# Patient Record
Sex: Female | Born: 1970
Health system: Southern US, Community
[De-identification: ages and names within clinical notes are randomized; demographics above are authoritative.]

## PROBLEM LIST (undated history)

## (undated) DIAGNOSIS — Z9889 Other specified postprocedural states: Secondary | ICD-10-CM

## (undated) DIAGNOSIS — M1990 Unspecified osteoarthritis, unspecified site: Secondary | ICD-10-CM

## (undated) DIAGNOSIS — M940 Chondrocostal junction syndrome [Tietze]: Secondary | ICD-10-CM

## (undated) DIAGNOSIS — T8859XA Other complications of anesthesia, initial encounter: Secondary | ICD-10-CM

## (undated) DIAGNOSIS — T4145XA Adverse effect of unspecified anesthetic, initial encounter: Secondary | ICD-10-CM

## (undated) DIAGNOSIS — Z8719 Personal history of other diseases of the digestive system: Secondary | ICD-10-CM

## (undated) DIAGNOSIS — K589 Irritable bowel syndrome without diarrhea: Secondary | ICD-10-CM

## (undated) DIAGNOSIS — B009 Herpesviral infection, unspecified: Secondary | ICD-10-CM

## (undated) DIAGNOSIS — L03311 Cellulitis of abdominal wall: Secondary | ICD-10-CM

## (undated) DIAGNOSIS — K219 Gastro-esophageal reflux disease without esophagitis: Secondary | ICD-10-CM

## (undated) DIAGNOSIS — Z8619 Personal history of other infectious and parasitic diseases: Secondary | ICD-10-CM

## (undated) DIAGNOSIS — I639 Cerebral infarction, unspecified: Secondary | ICD-10-CM

## (undated) DIAGNOSIS — J45909 Unspecified asthma, uncomplicated: Secondary | ICD-10-CM

## (undated) DIAGNOSIS — E282 Polycystic ovarian syndrome: Secondary | ICD-10-CM

## (undated) DIAGNOSIS — E119 Type 2 diabetes mellitus without complications: Secondary | ICD-10-CM

## (undated) DIAGNOSIS — Z87442 Personal history of urinary calculi: Secondary | ICD-10-CM

## (undated) DIAGNOSIS — G43909 Migraine, unspecified, not intractable, without status migrainosus: Secondary | ICD-10-CM

## (undated) DIAGNOSIS — R112 Nausea with vomiting, unspecified: Secondary | ICD-10-CM

## (undated) DIAGNOSIS — Q403 Congenital malformation of stomach, unspecified: Secondary | ICD-10-CM

## (undated) DIAGNOSIS — I10 Essential (primary) hypertension: Secondary | ICD-10-CM

## (undated) DIAGNOSIS — E538 Deficiency of other specified B group vitamins: Secondary | ICD-10-CM

## (undated) DIAGNOSIS — Z8742 Personal history of other diseases of the female genital tract: Secondary | ICD-10-CM

## (undated) HISTORY — PX: CORONARY ANGIOPLASTY: SHX604

## (undated) HISTORY — PX: CHOLECYSTECTOMY: SHX55

## (undated) HISTORY — PX: OVARY SURGERY: SHX727

## (undated) HISTORY — PX: TONSILLECTOMY: SUR1361

## (undated) HISTORY — DX: Cerebral infarction, unspecified: I63.9

## (undated) HISTORY — PX: ESOPHAGOGASTRODUODENOSCOPY: SHX1529

## (undated) HISTORY — PX: LAPAROSCOPIC GASTRIC BANDING: SHX1100

## (undated) HISTORY — PX: COLONOSCOPY: SHX174

## (undated) HISTORY — PX: BREAST EXCISIONAL BIOPSY: SUR124

## (undated) HISTORY — PX: TUBAL LIGATION: SHX77

---

## 2004-07-15 ENCOUNTER — Other Ambulatory Visit: Payer: Self-pay

## 2004-07-22 ENCOUNTER — Ambulatory Visit: Payer: Self-pay | Admitting: Obstetrics and Gynecology

## 2004-07-27 ENCOUNTER — Ambulatory Visit: Payer: Self-pay | Admitting: Internal Medicine

## 2004-08-21 HISTORY — PX: TUBAL LIGATION: SHX77

## 2004-10-28 ENCOUNTER — Ambulatory Visit: Payer: Self-pay

## 2004-12-09 ENCOUNTER — Emergency Department: Payer: Self-pay | Admitting: Emergency Medicine

## 2004-12-16 ENCOUNTER — Ambulatory Visit: Payer: Self-pay

## 2004-12-16 ENCOUNTER — Emergency Department: Payer: Self-pay | Admitting: Emergency Medicine

## 2005-02-19 ENCOUNTER — Observation Stay: Payer: Self-pay | Admitting: Obstetrics and Gynecology

## 2005-05-16 ENCOUNTER — Observation Stay: Payer: Self-pay | Admitting: Obstetrics and Gynecology

## 2005-05-22 ENCOUNTER — Observation Stay: Payer: Self-pay | Admitting: Obstetrics and Gynecology

## 2005-05-25 ENCOUNTER — Observation Stay: Payer: Self-pay | Admitting: Obstetrics and Gynecology

## 2005-05-27 ENCOUNTER — Observation Stay: Payer: Self-pay | Admitting: Obstetrics and Gynecology

## 2005-06-01 ENCOUNTER — Observation Stay: Payer: Self-pay | Admitting: Obstetrics and Gynecology

## 2005-06-03 ENCOUNTER — Observation Stay: Payer: Self-pay

## 2005-06-05 ENCOUNTER — Observation Stay: Payer: Self-pay | Admitting: Obstetrics and Gynecology

## 2005-06-06 ENCOUNTER — Ambulatory Visit: Payer: Self-pay | Admitting: Obstetrics and Gynecology

## 2005-06-08 ENCOUNTER — Observation Stay: Payer: Self-pay

## 2005-06-12 ENCOUNTER — Observation Stay: Payer: Self-pay | Admitting: Obstetrics and Gynecology

## 2005-06-15 ENCOUNTER — Observation Stay: Payer: Self-pay | Admitting: Obstetrics and Gynecology

## 2005-06-19 ENCOUNTER — Observation Stay: Payer: Self-pay | Admitting: Obstetrics and Gynecology

## 2005-06-22 ENCOUNTER — Observation Stay: Payer: Self-pay

## 2005-06-26 ENCOUNTER — Observation Stay: Payer: Self-pay | Admitting: Obstetrics and Gynecology

## 2005-06-29 ENCOUNTER — Observation Stay: Payer: Self-pay | Admitting: Obstetrics and Gynecology

## 2005-07-03 ENCOUNTER — Observation Stay: Payer: Self-pay | Admitting: Obstetrics and Gynecology

## 2005-07-06 ENCOUNTER — Observation Stay: Payer: Self-pay | Admitting: Certified Nurse Midwife

## 2005-07-10 ENCOUNTER — Observation Stay: Payer: Self-pay | Admitting: Obstetrics and Gynecology

## 2005-07-14 ENCOUNTER — Observation Stay: Payer: Self-pay | Admitting: Obstetrics and Gynecology

## 2005-07-17 ENCOUNTER — Observation Stay: Payer: Self-pay

## 2005-07-20 ENCOUNTER — Observation Stay: Payer: Self-pay

## 2005-07-24 ENCOUNTER — Inpatient Hospital Stay: Payer: Self-pay | Admitting: Obstetrics and Gynecology

## 2005-10-20 ENCOUNTER — Inpatient Hospital Stay: Payer: Self-pay | Admitting: Internal Medicine

## 2006-07-31 ENCOUNTER — Ambulatory Visit: Payer: Self-pay | Admitting: Obstetrics and Gynecology

## 2006-10-09 ENCOUNTER — Emergency Department: Payer: Self-pay | Admitting: Emergency Medicine

## 2008-02-24 ENCOUNTER — Ambulatory Visit: Payer: Self-pay | Admitting: Internal Medicine

## 2008-03-21 ENCOUNTER — Ambulatory Visit: Payer: Self-pay | Admitting: Internal Medicine

## 2008-05-18 ENCOUNTER — Ambulatory Visit: Payer: Self-pay | Admitting: Internal Medicine

## 2009-02-26 ENCOUNTER — Emergency Department: Payer: Self-pay | Admitting: Emergency Medicine

## 2009-06-03 ENCOUNTER — Ambulatory Visit: Payer: Self-pay | Admitting: Obstetrics and Gynecology

## 2010-06-29 ENCOUNTER — Ambulatory Visit: Payer: Self-pay

## 2010-08-21 HISTORY — PX: LAPAROSCOPIC GASTRIC BANDING: SHX1100

## 2011-06-29 ENCOUNTER — Ambulatory Visit: Payer: Self-pay | Admitting: Obstetrics and Gynecology

## 2011-07-02 ENCOUNTER — Inpatient Hospital Stay: Payer: Self-pay | Admitting: Internal Medicine

## 2011-07-05 ENCOUNTER — Ambulatory Visit: Payer: Self-pay | Admitting: Obstetrics and Gynecology

## 2012-04-17 ENCOUNTER — Emergency Department: Payer: Self-pay | Admitting: Emergency Medicine

## 2012-04-17 LAB — CBC
HCT: 41.4 % (ref 35.0–47.0)
HGB: 14 g/dL (ref 12.0–16.0)
MCH: 30.4 pg (ref 26.0–34.0)
MCHC: 33.9 g/dL (ref 32.0–36.0)
MCV: 90 fL (ref 80–100)
Platelet: 327 10*3/uL (ref 150–440)
RBC: 4.62 10*6/uL (ref 3.80–5.20)
RDW: 14 % (ref 11.5–14.5)
WBC: 9.4 10*3/uL (ref 3.6–11.0)

## 2012-04-17 LAB — COMPREHENSIVE METABOLIC PANEL
Albumin: 3.5 g/dL (ref 3.4–5.0)
Alkaline Phosphatase: 109 U/L (ref 50–136)
Anion Gap: 7 (ref 7–16)
BUN: 16 mg/dL (ref 7–18)
Bilirubin,Total: 0.3 mg/dL (ref 0.2–1.0)
Calcium, Total: 8.9 mg/dL (ref 8.5–10.1)
Chloride: 102 mmol/L (ref 98–107)
Co2: 29 mmol/L (ref 21–32)
Creatinine: 0.67 mg/dL (ref 0.60–1.30)
EGFR (African American): >60
EGFR (Non-African Amer.): >60
Glucose: 112 mg/dL — ABNORMAL HIGH (ref 65–99)
Osmolality: 278 (ref 275–301)
Potassium: 3.5 mmol/L (ref 3.5–5.1)
SGOT(AST): 28 U/L (ref 15–37)
SGPT (ALT): 30 U/L (ref 12–78)
Sodium: 138 mmol/L (ref 136–145)
Total Protein: 7.6 g/dL (ref 6.4–8.2)

## 2012-04-18 ENCOUNTER — Inpatient Hospital Stay: Payer: Self-pay | Admitting: Internal Medicine

## 2012-04-19 LAB — CBC WITH DIFFERENTIAL/PLATELET
Basophil #: 0.1 10*3/uL (ref 0.0–0.1)
Basophil %: 0.7 %
Eosinophil #: 0.2 10*3/uL (ref 0.0–0.7)
Eosinophil %: 2.6 %
HCT: 42.2 % (ref 35.0–47.0)
HGB: 14.5 g/dL (ref 12.0–16.0)
Lymphocyte #: 1.9 10*3/uL (ref 1.0–3.6)
Lymphocyte %: 24.3 %
MCH: 30.9 pg (ref 26.0–34.0)
MCHC: 34.4 g/dL (ref 32.0–36.0)
MCV: 90 fL (ref 80–100)
Monocyte #: 0.5 x10 3/mm (ref 0.2–0.9)
Monocyte %: 6.2 %
Neutrophil #: 5.2 10*3/uL (ref 1.4–6.5)
Neutrophil %: 66.2 %
Platelet: 306 10*3/uL (ref 150–440)
RBC: 4.7 10*6/uL (ref 3.80–5.20)
RDW: 14 % (ref 11.5–14.5)
WBC: 7.9 10*3/uL (ref 3.6–11.0)

## 2012-04-19 LAB — BASIC METABOLIC PANEL
Anion Gap: 8 (ref 7–16)
BUN: 13 mg/dL (ref 7–18)
Calcium, Total: 8.7 mg/dL (ref 8.5–10.1)
Chloride: 102 mmol/L (ref 98–107)
Co2: 26 mmol/L (ref 21–32)
Creatinine: 0.7 mg/dL (ref 0.60–1.30)
EGFR (African American): >60
EGFR (Non-African Amer.): >60
Glucose: 105 mg/dL — ABNORMAL HIGH (ref 65–99)
Osmolality: 272 (ref 275–301)
Potassium: 3.8 mmol/L (ref 3.5–5.1)
Sodium: 136 mmol/L (ref 136–145)

## 2012-04-21 LAB — WOUND CULTURE

## 2012-04-23 LAB — WOUND AEROBIC CULTURE

## 2012-04-23 LAB — CULTURE, BLOOD (SINGLE)

## 2012-06-13 ENCOUNTER — Ambulatory Visit: Payer: Self-pay | Admitting: Internal Medicine

## 2012-06-21 ENCOUNTER — Ambulatory Visit: Payer: Self-pay | Admitting: Internal Medicine

## 2012-08-21 HISTORY — PX: ESOPHAGOGASTRODUODENOSCOPY: SHX1529

## 2012-08-21 HISTORY — PX: COLONOSCOPY: SHX174

## 2012-09-28 ENCOUNTER — Inpatient Hospital Stay: Payer: Self-pay | Admitting: Internal Medicine

## 2012-09-28 LAB — CBC WITH DIFFERENTIAL/PLATELET
Basophil #: 0.1 10*3/uL (ref 0.0–0.1)
Basophil %: 0.6 %
Eosinophil #: 0.2 10*3/uL (ref 0.0–0.7)
Eosinophil %: 2.3 %
HCT: 43.1 % (ref 35.0–47.0)
HGB: 14.1 g/dL (ref 12.0–16.0)
Lymphocyte #: 2.5 10*3/uL (ref 1.0–3.6)
Lymphocyte %: 27.6 %
MCH: 30.2 pg (ref 26.0–34.0)
MCHC: 32.6 g/dL (ref 32.0–36.0)
MCV: 93 fL (ref 80–100)
Monocyte #: 0.7 x10 3/mm (ref 0.2–0.9)
Monocyte %: 7.9 %
Neutrophil #: 5.5 10*3/uL (ref 1.4–6.5)
Neutrophil %: 61.6 %
Platelet: 359 10*3/uL (ref 150–440)
RBC: 4.66 10*6/uL (ref 3.80–5.20)
RDW: 13.6 % (ref 11.5–14.5)
WBC: 8.9 10*3/uL (ref 3.6–11.0)

## 2012-09-28 LAB — BASIC METABOLIC PANEL
Anion Gap: 8 (ref 7–16)
BUN: 16 mg/dL (ref 7–18)
Calcium, Total: 9.1 mg/dL (ref 8.5–10.1)
Chloride: 106 mmol/L (ref 98–107)
Co2: 28 mmol/L (ref 21–32)
Creatinine: 0.9 mg/dL (ref 0.60–1.30)
EGFR (African American): >60
EGFR (Non-African Amer.): >60
Glucose: 89 mg/dL (ref 65–99)
Osmolality: 284 (ref 275–301)
Potassium: 3.8 mmol/L (ref 3.5–5.1)
Sodium: 142 mmol/L (ref 136–145)

## 2012-09-29 LAB — CBC WITH DIFFERENTIAL/PLATELET
Basophil #: 0.1 10*3/uL (ref 0.0–0.1)
Basophil %: 1 %
Eosinophil #: 0.2 10*3/uL (ref 0.0–0.7)
Eosinophil %: 3.3 %
HCT: 38.7 % (ref 35.0–47.0)
HGB: 12.9 g/dL (ref 12.0–16.0)
Lymphocyte #: 2 10*3/uL (ref 1.0–3.6)
Lymphocyte %: 30.7 %
MCH: 30.7 pg (ref 26.0–34.0)
MCHC: 33.4 g/dL (ref 32.0–36.0)
MCV: 92 fL (ref 80–100)
Monocyte #: 0.5 x10 3/mm (ref 0.2–0.9)
Monocyte %: 7.1 %
Neutrophil #: 3.8 10*3/uL (ref 1.4–6.5)
Neutrophil %: 57.9 %
Platelet: 284 10*3/uL (ref 150–440)
RBC: 4.21 10*6/uL (ref 3.80–5.20)
RDW: 13.1 % (ref 11.5–14.5)
WBC: 6.6 10*3/uL (ref 3.6–11.0)

## 2012-09-29 LAB — BASIC METABOLIC PANEL
Anion Gap: 6 — ABNORMAL LOW (ref 7–16)
BUN: 11 mg/dL (ref 7–18)
Calcium, Total: 8.4 mg/dL — ABNORMAL LOW (ref 8.5–10.1)
Chloride: 109 mmol/L — ABNORMAL HIGH (ref 98–107)
Co2: 25 mmol/L (ref 21–32)
Creatinine: 0.71 mg/dL (ref 0.60–1.30)
EGFR (African American): >60
EGFR (Non-African Amer.): >60
Glucose: 105 mg/dL — ABNORMAL HIGH (ref 65–99)
Osmolality: 279 (ref 275–301)
Potassium: 3.9 mmol/L (ref 3.5–5.1)
Sodium: 140 mmol/L (ref 136–145)

## 2012-09-30 LAB — VANCOMYCIN, TROUGH: Vancomycin, Trough: 4 ug/mL — ABNORMAL LOW (ref 10–20)

## 2012-10-04 LAB — CULTURE, BLOOD (SINGLE)

## 2013-01-28 DIAGNOSIS — Z9884 Bariatric surgery status: Secondary | ICD-10-CM | POA: Insufficient documentation

## 2013-02-18 ENCOUNTER — Ambulatory Visit: Payer: Self-pay | Admitting: Internal Medicine

## 2013-03-12 ENCOUNTER — Ambulatory Visit: Payer: Self-pay | Admitting: Internal Medicine

## 2013-04-28 ENCOUNTER — Ambulatory Visit: Payer: Self-pay | Admitting: Physician Assistant

## 2013-05-05 ENCOUNTER — Ambulatory Visit: Payer: Self-pay | Admitting: Gastroenterology

## 2013-05-06 LAB — PATHOLOGY REPORT

## 2013-07-22 ENCOUNTER — Ambulatory Visit: Payer: Self-pay | Admitting: Obstetrics and Gynecology

## 2013-07-23 ENCOUNTER — Ambulatory Visit: Payer: Self-pay | Admitting: Obstetrics and Gynecology

## 2013-08-21 DIAGNOSIS — Z8619 Personal history of other infectious and parasitic diseases: Secondary | ICD-10-CM

## 2013-08-21 HISTORY — DX: Personal history of other infectious and parasitic diseases: Z86.19

## 2013-11-22 ENCOUNTER — Ambulatory Visit: Payer: Self-pay | Admitting: Internal Medicine

## 2013-12-23 DIAGNOSIS — E282 Polycystic ovarian syndrome: Secondary | ICD-10-CM | POA: Insufficient documentation

## 2013-12-23 DIAGNOSIS — I10 Essential (primary) hypertension: Secondary | ICD-10-CM | POA: Insufficient documentation

## 2014-03-17 ENCOUNTER — Ambulatory Visit: Payer: Self-pay | Admitting: Internal Medicine

## 2014-03-29 ENCOUNTER — Ambulatory Visit: Payer: Self-pay | Admitting: Physician Assistant

## 2014-06-19 DIAGNOSIS — Z8619 Personal history of other infectious and parasitic diseases: Secondary | ICD-10-CM | POA: Insufficient documentation

## 2014-09-16 ENCOUNTER — Ambulatory Visit: Payer: Self-pay | Admitting: Obstetrics and Gynecology

## 2014-09-23 ENCOUNTER — Other Ambulatory Visit (HOSPITAL_COMMUNITY): Payer: Self-pay | Admitting: Orthopedic Surgery

## 2014-09-23 DIAGNOSIS — M79641 Pain in right hand: Secondary | ICD-10-CM

## 2014-10-02 ENCOUNTER — Ambulatory Visit (HOSPITAL_COMMUNITY): Payer: Self-pay

## 2014-10-12 ENCOUNTER — Inpatient Hospital Stay (HOSPITAL_COMMUNITY)
Admission: RE | Admit: 2014-10-12 | Discharge: 2014-10-12 | Disposition: A | Discharge status: Home / Self Care | Payer: 59 | Source: Ambulatory Visit

## 2014-10-12 ENCOUNTER — Ambulatory Visit (HOSPITAL_COMMUNITY)
Admission: RE | Admit: 2014-10-12 | Discharge: 2014-10-12 | Disposition: A | Discharge status: Home / Self Care | Payer: 59 | Source: Ambulatory Visit | Attending: Orthopedic Surgery | Admitting: Orthopedic Surgery

## 2014-10-12 DIAGNOSIS — M79641 Pain in right hand: Secondary | ICD-10-CM

## 2014-10-13 ENCOUNTER — Other Ambulatory Visit (HOSPITAL_COMMUNITY): Payer: Self-pay | Admitting: Orthopedic Surgery

## 2014-10-13 DIAGNOSIS — M79641 Pain in right hand: Secondary | ICD-10-CM

## 2014-12-08 NOTE — Consult Note (Signed)
PATIENT NAME:  Kathryn Coffey, Kathryn Coffey MR#:  [id number] DATE OF BIRTH:  [birth year]  DATE OF CONSULTATION:  04/19/2012  REFERRING PHYSICIAN:   CONSULTING PHYSICIAN:  Loreli Dollar, MD  HISTORY OF PRESENT ILLNESS: This 44 year old female came in emergently to the hospital on August 29. She had a chief complaint of an area of swelling and redness of the right lower abdomen. She had a wide area of cellulitis. She has been treated with intravenous antibiotics and erythema has markedly receded but has localized abscess in the skin of the right lower quadrant.   She had some drainage from the wound in the right lower quadrant, however, culture was with no growth. Blood culture was negative.   PAST MEDICAL HISTORY:  1. Hypertension. 2. Polycystic ovary disease. 3. Obesity.   MEDICATIONS:  1. Wellbutrin. 2. Victoza. 3. Had taken Septra prior to admission. 4. Micardis with hydrochlorothiazide and a separate hydrochlorothiazide tablet has. 5. Been treated with ceftriaxone and Levaquin.   PHYSICAL EXAMINATION:  GENERAL: She is awake, alert and oriented, resting in the hospital bed.   VITAL SIGNS: Temperature 98, pulse 87, respirations 20, blood pressure 122/84, pulse oximetry 96%.   SKIN: Does have a large lower abdominal pannus. I see an ink mark which had been recorded as the spread of cellulitis which appeared to be more than 20 cm across but now appears the cellulitis has markedly receded just to a small area within the lower abdominal pannus. There is a small area of slight grayish, reddish discoloration and a small area of fluctuance.   LABORATORY, DIAGNOSTIC AND RADIOLOGICAL DATA: Her creatinine is normal. Glucose slightly elevated at 105. White blood count, hemoglobin normal.   IMPRESSION: Subcutaneous abscess of abdominal wall with surrounding cellulitis.   RECOMMENDATIONS: I recommended incision and drainage and after brief discussion treated the area with Betadine, draped with four sterile  towels and with sterile technique infiltrated skin with 1% Xylocaine with epinephrine. I made a lancing incision over the fluctuant area and there was some bloody pus which erupted which appeared to amount to about 2 mL and there was no significant necrotic tissue within the wound. The incision was approximately 14 mm long. Dressings were applied with paper tape. Swab sent for culture.   Wound care instructions given and she will follow up with Dr. Emily Filbert next week.  ____________________________ Lenna Sciara. Rochel Brome, MD jws:cms D: 04/19/2012 12:51:32 ET T: 04/19/2012 13:13:45 ET JOB#: [id number]  cc: Loreli Dollar, MD, <Dictator> Rusty Aus, MD Loreli Dollar MD ELECTRONICALLY SIGNED 04/22/2012 10:32

## 2014-12-08 NOTE — Discharge Summary (Signed)
PATIENT NAME:  Kathryn Coffey, Kathryn Coffey MR#:  [id number] DATE OF BIRTH:  04-08-1971  DATE OF ADMISSION:  04/18/2012 DATE OF DISCHARGE:  04/19/2012  DISCHARGE DIAGNOSES:  1. Abdominal wall abscess with associated cellulitis, probable staph aureus.  2. Hypertension.  3. Polycystic ovarian disease.  4. Obesity.   DISCHARGE MEDICATIONS:  1. Clindamycin 300 mg t.i.d.  2. Levaquin 500 mg daily. 3. Florastor probiotic, 1 b.i.d.  4. Micardis HCT 80/12.5 mg daily.  5. Hydrochlorothiazide 25 mg daily.  6. Wellbutrin XL 150 mg daily.  7. Victoza 0.6 mg daily.  8. Multivitamin daily.   REASON FOR ADMISSION: This is a 44 year old female who presented with abdominal wall abscess and progressive cellulitis in the face of oral antibiotics. Please see History and Physical for history of present illness, past medical history, and physical exam.   HOSPITAL COURSE: The patient was admitted, placed on vancomycin and Rocephin. Her cellulitis improved dramatically. A surgical consult was entered for plan for incision and drainage of that area. She will ultimately probably need staph skin decontamination per protocol as an outpatient and will follow up with Dr. Sabra Heck on Tuesday. Her cellulitis has resolved.   ____________________________ Rusty Aus, MD mfm:bjt D: 04/19/2012 07:54:28 ET T: 04/19/2012 08:56:19 ET JOB#: [id number]  MARK F MILLER MD ELECTRONICALLY SIGNED 04/22/2012 9:12

## 2014-12-08 NOTE — H&P (Signed)
PATIENT NAME:  Kathryn Coffey, NAFF MR#:  [id number] DATE OF BIRTH:  Sep 16, 1970  DATE OF ADMISSION:  04/18/2012  PRIMARY CARE PHYSICIAN: Dr. Emily Filbert REFERRING PHYSICIAN: Dr. Lovena Le  CHIEF COMPLAINT: Abdominal cellulitis.   HISTORY OF PRESENT ILLNESS: This is a 44 year old female with significant past medical history of hypertension, obesity, polycystic ovarian disease who presents with complaint of worsening abdominal wall cellulitis. 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 where she received a total for three days without improvement of her abdominal wall cellulitis where she followed again today with hospital health office where she was noticed to have worsening cellulitis despite three days of p.o. Septra where she received one dose of IV clindamycin and was sent to ED for further evaluation and hospitalist was requested to admit the patient for IV antibiotic administration as she failed outpatient therapy. Patient denies any fever, chills, dysuria, polyuria, chest pain, shortness of breath or discharge.   PAST MEDICAL HISTORY:  1. History of systemic hypertension.  2. History of polycystic ovary.  3. Obesity.   FAMILY HISTORY: Both parents have diabetes. Father had heart attack in his 31s.   SOCIAL HISTORY: Patient is married, has two children. 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 No history of alcohol or tobacco abuse.   ALLERGIES: Codeine, IV dye, Vicodin, Trimox.   HOME MEDICATIONS:  1. Wellbutrin XL 150 mg oral daily.  2. Victoza sub-Q injection 0.6 mg daily.  3. Septra double strength 800 mg/160 oral 2 times a day for last three days.  4. Multivitamin 1 tablet daily.  5. Micardis/hydrochlorothiazide 80/12 .5 mg oral 1 tablet daily.  6. Hydrochlorothiazide 25 mg oral daily.   REVIEW OF SYSTEMS: CONSTITUTIONAL: Patient denies any fever,  fatigue, weakness. EYES: Denies blurry vision, double vision or pain. ENT: Denies tinnitus, ear pain, hearing loss. RESPIRATORY: No cough, wheezing, hemoptysis. CARDIOVASCULAR: No chest pain, arrhythmia, palpitation. GASTROINTESTINAL: No hematemesis, melena, rectal bleed, constipation. GENITOURINARY: No dysuria, hematuria, renal colic. ENDO: No polyuria, polydipsia, heat or cold intolerance. HEMATOLOGY: No anemia, bleeding diathesis. NEUROLOGICAL: No numbness, weakness, dysarthria, epilepsy, tremors. PSYCHIATRIC: No alcohol or substance abuse.   PHYSICAL EXAMINATION:  VITAL SIGNS: Temperature 98.3, pulse 87, respiratory rate 20, blood pressure 115/57, saturating 95% on room air.   GENERAL: Well-nourished female looks comfortable in bed in no apparent distress.   HEENT: Head atraumatic, normocephalic. Pupils equal, reactive to light. Pink conjunctivae. Anicteric sclerae. Moist oral mucosa.   NECK: Supple. No thyromegaly. No JVD.   CHEST: Good air entry bilaterally. No wheezing, rales, rhonchi.   CARDIOVASCULAR: S1, S2 heard. No rubs, murmur, gallops.   ABDOMEN: Soft, nontender, nondistended. Bowel sounds present. Patient has an area of lower abdominal pain with erythema, warmth, mildly tender to palpation with a small area with minimal sanguineous secretions and skin ulceration.   PSYCHIATRIC: Appropriate affect. Awake, alert x3. Intact judgment and insight.   NEUROLOGIC: Cranial nerves grossly intact. Motor 5/5. Sensation intact and symmetrical.   EXTREMITIES: No edema. No clubbing. No cyanosis.   LABORATORY, DIAGNOSTIC AND RADIOLOGICAL DATA: Glucose 112, BUN 16, creatinine 0.67, sodium 138, potassium 3.5, chloride 102, CO2 29, white blood cells 9.4, hemoglobin 14, hematocrit 41.4, platelets 327.   ASSESSMENT AND PLAN:  1. Abdominal wall cellulitis. Patient is afebrile, has no leukocytosis but she failed outpatient antibiotic treatment so she will be admitted for IV antibiotic treatment.  Will  be started on IV vancomycin and will send blood cultures.  2. Hypertension. Appears to be well controlled. Will continue with home medication.  3. polycystic ovarian disease: cont victoza 4. Deep vein thrombosis prophylaxis sub-Q heparin. GI prophylaxis Protonix.  5. CODE STATUS: FULL CODE.   TOTAL TIME SPENT ON ADMISSION AND PATIENT CARE: 40 minutes.   ____________________________ Albertine Patricia, MD dse:cms D: 04/18/2012 01:08:45 ET T: 04/18/2012 07:49:14 ET JOB#: [id number]  cc: Albertine Patricia, MD, <Dictator> Rusty Aus, MD Alleah Dearman Graciela Husbands MD ELECTRONICALLY SIGNED 04/19/2012 0:02

## 2014-12-11 NOTE — H&P (Signed)
PATIENT NAME:  Kathryn Coffey, Kathryn Coffey MR#:  [id number] DATE OF BIRTH:  1970-12-29  DATE OF ADMISSION:  09/28/2012  PRIMARY CARE PHYSICIAN: Emily Filbert, MD.  REFERRING PHYSICIAN: Charlesetta Ivory, MD.   CHIEF COMPLAINT: Abdominal phlebitis.   HISTORY OF PRESENT ILLNESS: This is a 44 year old female with significant past medical history of hypertension, obesity, polycystic ovarian disease, with recent admission and discharged for abdominal wall cellulitis with possible abscess where she required admission for IV antibiotic, which resolved after IV Rocephin and IV vancomycin with suspicion for MRSA. The patient reports she started to notice some lower abdominal pain, this time on the left side, not on the lower side, where it started initially as a small dot, where she went and saw her primary care physician, where he give her Septra double strength where she do it for 24 hours, but she reports it progressed significantly over 1 day which prompted her to come to the ER, and reports she does not want it to progress and to worsen like the previous one, which prompted her to come to the ER. The patient had a CT of the abdominal wall, which came questionable for a tiny abscess formation in  the subcutaneous soft tissue in the left lower quadrant, 1 x 0.3 x 0.8 cm. she denies any fever, chills, dysuria, polyuria, chest pain, shortness of breath, coffee-ground emesis, or melena.   PAST MEDICAL HISTORY:  1. A history of hypertension.  2. Polycystic ovary disease.  3. Obesity.   FAMILY HISTORY: Significant for diabetes and cardiac disease.   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 No history of alcohol or tobacco abuse.   ALLERGIES:  1. CODEINE.  2. IV DYE.  3. VICODIN.  4. TRIMOX.   HOME MEDICATIONS:  1. Wellbutrin XL 300 mg oral 1 tablet every 24 hours.  2. Hydrochlorothiazide/losartan 25/100 mg oral daily.  3. Septra double strength at 800/160 mg oral 2 times a  day started for 1 day.  4. Multivitamin 1 tablet oral daily.  5. Vitamin B12 oral daily.  6. Victoza 0.6 mg oral daily.   REVIEW OF SYSTEMS:  CONSTITUTIONAL: The patient denies any fever, fatigue, chills, weight gain, weight loss or weakness.  EYES: Denies blurry vision, double vision, pain, glaucoma.  ENT: Denies tinnitus, ear pain, hearing loss, nasal discharge.  RESPIRATORY: Denies cough, wheezing, hemoptysis, and dyspnea.  CARDIOVASCULAR: Denies chest pain, arrhythmia, palpitations, syncope or swelling. GASTROINTESTINAL: Denies nausea, vomiting, diarrhea, abdominal pain, rectal bleed or constipation.  GENITOURINARY: Denies dysuria, hematuria, or renal colic. ENDOCRINOLOGY: Denies polyuria, polydipsia, heat or cold intolerance.  HEMATOLOGY: Denies any anemia, easy bruising, bleeding diathesis.  INTEGUMENTARY: Has complaints of left lower abdominal erythema, tenderness.  NEUROLOGIC: Denies any tingling, numbness, weakness, dysarthria, epilepsy, tremors, CVA, TIA, or seizures.  PSYCHIATRIC: Denies alcohol, substance abuse, anxiety or schizophrenia.   PHYSICAL EXAMINATION:  VITAL SIGNS: Temperature 98.5, pulse 68, respiratory rate 22, blood pressure 125/74, saturating 96% on room air.  GENERAL: A well-nourished female, obese, looks comfortable, in no apparent distress.  HEENT: Head atraumatic, normocephalic, pupils equal, reactive to light. Pink conjunctivae. Anicteric sclerae. Moist oral mucosa.  NECK: Supple. No thyromegaly. No JVD.  CHEST: Good air entry bilaterally. No wheezing, rales, rhonchi.  ABDOMEN: Soft, nontender, nondistended. Bowel sounds present. Obese. Has in the left lower quadrant erythema and tenderness and warmth with a central area which appears to be more angry than the rest but no drainage.  EXTREMITIES: No edema. No clubbing.  No cyanosis.  PSYCHIATRIC: Appropriate affect. Awake, alert x 3. Intact judgment and insight.  NEUROLOGIC: Cranial nerves grossly intact. Motor  5/5.  SKIN: Has left lower extremity cellulitis, otherwise no other rashes, normal skin turgor. Warm and dry.  LYMPHATICS: Has no neck lymph enlargement.   PERTINENT LABORATORY DATA: Glucose 89, BUN 16, creatinine 0.9, sodium 142, potassium 3.8, chloride 106, CO2 28, white blood cell 8.9, hemoglobin 14.1, hematocrit 43.1, platelets 359. Abdominal ultrasound showing questionable tiny abscess formation in the subcutaneous soft tissue, left lower quadrant.   ASSESSMENT AND PLAN:  1. Abdominal wall cellulitis. The patient is febrile, has no leukocytosis, but has significant progression of her cellulitis despite being on p.o. Septra, so she will be admitted for IV antibiotic. We will start her on IV vancomycin and will follow on the blood cultures. We will monitor closely her central area. At this point, it does not appears to be significant abscess needs to be drainage, if continues to progress, will need surgical consult.  2. Hypertension, acceptable. Continue with home medication.  3. Polycystic ovarian disease. Continue with Victoza.  4. Deep vein thrombosis prophylaxis. Continue with subcutaneous heparin.  5. Gastrointestinal prophylaxis. Continue with Protonix.   CODE STATUS: FULL CODE.   TOTAL TIME SPENT ON ADMISSION AND PATIENT CARE: 45 minutes.  ____________________________ Albertine Patricia, MD dse:jm D: 09/28/2012 07:15:05 ET T: 09/28/2012 11:09:52 ET JOB#: [id number]  cc: Albertine Patricia, MD, <Dictator> Usha Slager Graciela Husbands MD ELECTRONICALLY SIGNED 09/29/2012 2:02

## 2014-12-11 NOTE — Discharge Summary (Signed)
PATIENT NAME:  Kathryn Coffey, Kathryn Coffey MR#:  [id number] DATE OF BIRTH:  February 01, 1971  DATE OF ADMISSION:  09/28/2012 DATE OF DISCHARGE:    DISCHARGE DIAGNOSES:  1.  Staphylococcus cellulitis with abscess.  2.  Polycystic ovarian disease.  3.  Obesity.   DISCHARGE MEDICATIONS:  1.  Victoza 0.6 mg daily.  2.  Multivitamin daily.  3.  Wellbutrin XL 300 mg daily. 4.  Losartan/HCT 100/25 daily.  5.  B12 1000 mcg daily. 6.  Etodolac 400 mg b.i.d.  7.  Tramadol 50 mg t.i.d. p.r.n. pain. 8.   Clindamycin 300 mg t.i.d. x 7 days.  9.  Levaquin 500 mg daily x 7 days. 10. Pro-biotic b.i.d.   REASON FOR ADMISSION: The patient is a 44 year old female, who presents with cellulitis and abscess left lower quadrant abdominal wall. Please see history and physical for history of present illness, past medical history, and physical exam.   HOSPITAL COURSE: The patient was admitted, placed on IV vancomycin and her cellulitis resolved. The abscessed area improved. It was not indurated enough for surgical exploration. She had failed the Septra. She will go home on Levaquin and clindamycin. She will do staph decontamination skin protocol. Follow up with Dr. Sabra Heck on Thursday. Off work this week. Overall prognosis is good.    ____________________________ Rusty Aus, MD mfm:aw D: 09/30/2012 06:27:43 ET T: 09/30/2012 06:57:20 ET JOB#: [id number]  cc: Rusty Aus, MD, <Dictator> Rusty Aus MD ELECTRONICALLY SIGNED 10/01/2012 17:26

## 2015-08-23 ENCOUNTER — Emergency Department
Admission: EM | Admit: 2015-08-23 | Discharge: 2015-08-23 | Discharge status: Against Medical Advice | Payer: 59 | Attending: Emergency Medicine | Admitting: Emergency Medicine

## 2015-08-23 ENCOUNTER — Encounter: Payer: Self-pay | Admitting: Emergency Medicine

## 2015-08-23 DIAGNOSIS — I10 Essential (primary) hypertension: Secondary | ICD-10-CM | POA: Insufficient documentation

## 2015-08-23 DIAGNOSIS — R05 Cough: Secondary | ICD-10-CM | POA: Insufficient documentation

## 2015-08-23 HISTORY — DX: Essential (primary) hypertension: I10

## 2015-08-23 NOTE — ED Notes (Signed)
Patient ambulatory to triage with steady gait, without difficulty or distress noted; pt reports recent antibiotics for ear/sinus infection; now with persistent nonprod cough

## 2015-08-24 DIAGNOSIS — R1314 Dysphagia, pharyngoesophageal phase: Secondary | ICD-10-CM | POA: Diagnosis not present

## 2015-08-24 DIAGNOSIS — K219 Gastro-esophageal reflux disease without esophagitis: Secondary | ICD-10-CM | POA: Diagnosis not present

## 2015-08-24 DIAGNOSIS — R05 Cough: Secondary | ICD-10-CM | POA: Diagnosis not present

## 2015-08-24 DIAGNOSIS — Z6841 Body Mass Index (BMI) 40.0 and over, adult: Secondary | ICD-10-CM | POA: Diagnosis not present

## 2015-08-24 DIAGNOSIS — I10 Essential (primary) hypertension: Secondary | ICD-10-CM | POA: Diagnosis not present

## 2015-08-24 DIAGNOSIS — R4702 Dysphasia: Secondary | ICD-10-CM | POA: Diagnosis not present

## 2015-08-24 DIAGNOSIS — R1319 Other dysphagia: Secondary | ICD-10-CM | POA: Diagnosis not present

## 2015-08-24 DIAGNOSIS — K9509 Other complications of gastric band procedure: Secondary | ICD-10-CM | POA: Diagnosis not present

## 2015-08-24 DIAGNOSIS — R1110 Vomiting, unspecified: Secondary | ICD-10-CM | POA: Diagnosis not present

## 2015-08-24 DIAGNOSIS — E282 Polycystic ovarian syndrome: Secondary | ICD-10-CM | POA: Diagnosis not present

## 2015-09-08 DIAGNOSIS — I10 Essential (primary) hypertension: Secondary | ICD-10-CM | POA: Diagnosis not present

## 2015-09-08 DIAGNOSIS — R7989 Other specified abnormal findings of blood chemistry: Secondary | ICD-10-CM | POA: Diagnosis not present

## 2015-09-08 DIAGNOSIS — Z713 Dietary counseling and surveillance: Secondary | ICD-10-CM | POA: Diagnosis not present

## 2015-09-08 DIAGNOSIS — R7301 Impaired fasting glucose: Secondary | ICD-10-CM | POA: Diagnosis not present

## 2015-09-08 DIAGNOSIS — Z9884 Bariatric surgery status: Secondary | ICD-10-CM | POA: Diagnosis not present

## 2015-09-08 DIAGNOSIS — K9509 Other complications of gastric band procedure: Secondary | ICD-10-CM | POA: Diagnosis not present

## 2015-09-30 ENCOUNTER — Other Ambulatory Visit: Payer: Self-pay | Admitting: *Deleted

## 2015-09-30 DIAGNOSIS — J40 Bronchitis, not specified as acute or chronic: Secondary | ICD-10-CM | POA: Diagnosis not present

## 2015-09-30 DIAGNOSIS — J4522 Mild intermittent asthma with status asthmaticus: Secondary | ICD-10-CM | POA: Diagnosis not present

## 2015-09-30 NOTE — Patient Outreach (Signed)
[facility]) Care Management  09/30/2015  LABRIA SEAMANS [birth year] [id number]  RN attempted to reach pt at the requested work number however no answer and RN unable to leave a message. RN attempted another contact number and was able to reach pt and spoke in detail concerning her request. Pt states she has a lap band that has slipped out of place and seeks to remove the band and have gastric bypass surgery however pt is requesting to have this procedure at [facility] . Pt is willing to pay the out of network cost however pt being informed that the procedure is not covered at all with no network coverage. Based upon pt's request RN will research and follow up accordingly with pt concerning coverage or no coverage for out of network expenses.   Raina Mina, RN Care Management Coordinator [place] Office [phone]

## 2015-09-30 NOTE — Patient Outreach (Signed)
[facility]) Care Management  09/30/2015  Kathryn Coffey 06-09-1971 [id number]   RN spoke with pt today after researching the request for benefits at an out of network facility. It was confirmed that pt will not have any coverage is she is having a gastric bypass procedure completed at an out of network facility. The procedure is only coverage as a benefit at two locations that are of the [place] facilities. Pt with understanding that she is unable to request a benefit exception at this time for out of network coverage. Pt was aware of this benefit prior to submitting the benefit exception and RN confirmed with leadership and [employer]. Case will be closed with no benefit exception submitted.   Raina Mina, RN Care Management Coordinator [place] Office [phone]

## 2015-10-11 ENCOUNTER — Encounter: Payer: 59 | Attending: Bariatrics | Admitting: Dietician

## 2015-10-11 ENCOUNTER — Encounter: Payer: Self-pay | Admitting: Dietician

## 2015-10-11 DIAGNOSIS — R7303 Prediabetes: Secondary | ICD-10-CM | POA: Insufficient documentation

## 2015-10-11 DIAGNOSIS — I10 Essential (primary) hypertension: Secondary | ICD-10-CM | POA: Diagnosis not present

## 2015-10-11 DIAGNOSIS — E669 Obesity, unspecified: Secondary | ICD-10-CM | POA: Insufficient documentation

## 2015-10-11 NOTE — Progress Notes (Signed)
Medical Nutrition Therapy: Visit start time: 1500  end time: 1600  Assessment:  Diagnosis: obesity Past medical history: HTN, pre-diabetes Psychosocial issues/ stress concerns: reports moderate stress; deals with stress by resting, watching TV.          Takes anti-depressant meds. Preferred learning method:  . Hands-on   Current weight: 303.1lbs  Height: 5'5" Medications, supplements: reviewed list in chart with patient  Progress and evaluation: Patient has had lap band surgery for weight loss in 2009, with recurring side effects and complications.          She is planning to have the band removed and have gastric bypass surgery.          She reports increased eating since band was loosened in December, and has gained about 10lbs.          She has undergone RD and weight loss visits within the past year, but is repeating due to insurance policy.   Physical activity: no structured exercise; reports staying busy at home  Dietary Intake:  Usual eating pattern includes 3 meals and 2-3 snacks per day. Dining out frequency: 2 meals per week.  Breakfast: [place] [employer] sandwich Snack: 2 cuties daily, small granola bar Lunch: Usually [facility] cafeteria: wrap sandwich with chicken, bacon, provolone, 2/20 potato bar, occasionally grilled foods. Rarely leftovers from home. Snack: Often bag of chips and/or oatmeal cream pie Supper: usually cooks; pasta, vegetables, chicken, beef, fish; not much frying, rice. Does not like brown rice.  Snack: occasionally popcorn, usually none Beverages: water- 2-4 yeti cups daily. Rarely tea with 1c. Sugar per gallon. Sometimes diet coke.   Nutrition Care Education: Topics covered: bariatric diet, prep for surgery Bariatric diet: general guidelines for pre-surgery diet modification; reviewed stages of bariatric diet, protein and fluid needs, avoidance of complications.   Discussed importance of changing relationship with food to allow for long-term success.  Discussed support system.      Weight control: calculated energy needs for weight loss at about 1600kcal daily; discussed options for healthy snacks.   Nutritional Diagnosis:  [place]-3.3 Overweight/obesity As related to history of excess calories, inactivity.  As evidenced by patient report.  Intervention: Instruction as noted above.   Set goals with patient input.   Education Materials given:  Marland Kitchen Bariatric surgery guidelines and diet . Goals/ instructions  Learner/ who was taught:  . Patient   Level of understanding: Marland Kitchen Verbalizes/ demonstrates competency  Demonstrated degree of understanding via:   Teach back Learning barriers: . None  Willingness to learn/ readiness for change: . Eager, change in progress  Monitoring and Evaluation:  Dietary intake, exercise, and body weight      follow up: 11/08/15

## 2015-10-11 NOTE — Patient Instructions (Signed)
   Resume portion control, especially of starchy foods.   Decrease desserts and snacks, choose fruits, nuts or cheese,yogurt, protein bars for snacks.  Continue with plenty of healthy fluids and balanced meals.

## 2015-10-19 ENCOUNTER — Other Ambulatory Visit: Payer: Self-pay | Admitting: Bariatrics

## 2015-10-20 ENCOUNTER — Ambulatory Visit
Admission: RE | Admit: 2015-10-20 | Discharge: 2015-10-20 | Disposition: A | Discharge status: Home / Self Care | Payer: 59 | Source: Ambulatory Visit | Attending: Bariatrics | Admitting: Bariatrics

## 2015-10-20 DIAGNOSIS — I10 Essential (primary) hypertension: Secondary | ICD-10-CM | POA: Diagnosis not present

## 2015-10-25 DIAGNOSIS — Z9884 Bariatric surgery status: Secondary | ICD-10-CM | POA: Diagnosis not present

## 2015-11-08 ENCOUNTER — Encounter: Payer: 59 | Attending: Bariatrics | Admitting: Dietician

## 2015-11-08 DIAGNOSIS — R7303 Prediabetes: Secondary | ICD-10-CM | POA: Insufficient documentation

## 2015-11-08 DIAGNOSIS — E669 Obesity, unspecified: Secondary | ICD-10-CM | POA: Insufficient documentation

## 2015-11-08 DIAGNOSIS — I10 Essential (primary) hypertension: Secondary | ICD-10-CM | POA: Diagnosis not present

## 2015-11-08 NOTE — Patient Instructions (Signed)
   Restock healthy snacks -- include some protein options such as lowfat cheese stick, or 1/2 handful (or 1/4cup) nuts, or 1 Tbsp. Peanut butter with a fruit, or 3 cups of popcorn, or 5-10 whole grain crackers or baked whole grain chips such as Sun chips. Or a cup of yogurt or [place] yogurt.  Start using 6-inch plates for eating.   Begin some light exercise, walking or other moving or stretching during the day.

## 2015-11-08 NOTE — Progress Notes (Signed)
Medical Nutrition Therapy: Visit start time: 8110  end time: 1600  Assessment:  Diagnosis: obesity, pre-bariatric surgery Medical history changes: no changes Psychosocial issues/ stress concerns: none  Current weight: 309.8lbs  Height: 5'5" Medications, supplement changes: no changes  Progress and evaluation: Weight gain of over 6lbs since previous visit; patient feels due to increase in fluid retention, and possibly due to hunger.          She has experienced increased hunger since removal of lap band.           She has made some positive diet changed, including changing dressing on salads to vinegar and oil, avoiding sodas and sweet tea, and making healthier snack choices.   Physical activity: no structured activity; plans to walk this evening. Changed jobs in December to a more sedentary job.  Dietary Intake:  Usual eating pattern includes 3 meals and 3-4 snacks per day. Dining out frequency: not assessed today.  Breakfast: Low-cal breakfast sandwich Snack: 2 cuties/ halo oranges or small granola bar Lunch: usually salad with grilled chicken, a few croutons; or wrap sandwich Snack: fruit or baked chips or popcorn Supper: varies; cooks balanced meal for the family; mostly grilled or baked foods.  Snack: sometimes popcorn Beverages: water, sugar free beverages  Nutrition Care Education: Topics covered: weight management, bariatric diet Weight control: behavioral changes for weight loss: portion control, snack choices, beverage choices Bariatric Diet: importance of avoiding sodas after surgery; diet progression dependent on individual progress with healing; food and eating mindset after surgery; possible taste changes. Other lifestyle changes:  Exercise benefits and strategies.  Nutritional Diagnosis:  [place]-3.3 Overweight/obesity As related to history of excess caloric intake, inactivity.  As evidenced by patient report.  Intervention: Discussion as noted above.   Patient has met or  made progress on goals set at previous visit.   Set new goals for this month.   Education Materials given:  Marland Kitchen Goals/ instructions  Learner/ who was taught:  . Patient   Level of understanding: Marland Kitchen Verbalizes/ demonstrates competency  Demonstrated degree of understanding via:   Teach back Learning barriers: . None  Willingness to learn/ readiness for change: . Eager, change in progress  Monitoring and Evaluation:  Dietary intake, exercise, and body weight      follow up: 12/09/15

## 2015-11-10 DIAGNOSIS — R0602 Shortness of breath: Secondary | ICD-10-CM | POA: Diagnosis not present

## 2015-11-22 DIAGNOSIS — K210 Gastro-esophageal reflux disease with esophagitis: Secondary | ICD-10-CM | POA: Diagnosis not present

## 2015-11-22 DIAGNOSIS — Z01818 Encounter for other preprocedural examination: Secondary | ICD-10-CM | POA: Diagnosis not present

## 2015-11-26 ENCOUNTER — Ambulatory Visit: Payer: 59 | Attending: Specialist

## 2015-11-26 DIAGNOSIS — G4733 Obstructive sleep apnea (adult) (pediatric): Secondary | ICD-10-CM | POA: Insufficient documentation

## 2015-11-26 DIAGNOSIS — Z6841 Body Mass Index (BMI) 40.0 and over, adult: Secondary | ICD-10-CM | POA: Insufficient documentation

## 2015-11-26 DIAGNOSIS — G4761 Periodic limb movement disorder: Secondary | ICD-10-CM | POA: Insufficient documentation

## 2015-12-02 ENCOUNTER — Encounter: Payer: Self-pay | Admitting: *Deleted

## 2015-12-06 ENCOUNTER — Ambulatory Visit: Payer: 59 | Admitting: Anesthesiology

## 2015-12-06 ENCOUNTER — Encounter
Admission: RE | Disposition: A | Discharge status: Home / Self Care | Payer: Self-pay | Source: Ambulatory Visit | Attending: Unknown Physician Specialty

## 2015-12-06 ENCOUNTER — Ambulatory Visit
Admission: RE | Admit: 2015-12-06 | Discharge: 2015-12-06 | Disposition: A | Discharge status: Home / Self Care | Payer: 59 | Source: Ambulatory Visit | Attending: Unknown Physician Specialty | Admitting: Unknown Physician Specialty

## 2015-12-06 DIAGNOSIS — Z9049 Acquired absence of other specified parts of digestive tract: Secondary | ICD-10-CM | POA: Insufficient documentation

## 2015-12-06 DIAGNOSIS — K9509 Other complications of gastric band procedure: Secondary | ICD-10-CM | POA: Diagnosis not present

## 2015-12-06 DIAGNOSIS — K219 Gastro-esophageal reflux disease without esophagitis: Secondary | ICD-10-CM | POA: Insufficient documentation

## 2015-12-06 DIAGNOSIS — Z8619 Personal history of other infectious and parasitic diseases: Secondary | ICD-10-CM | POA: Insufficient documentation

## 2015-12-06 DIAGNOSIS — Z9884 Bariatric surgery status: Secondary | ICD-10-CM | POA: Diagnosis not present

## 2015-12-06 DIAGNOSIS — K2900 Acute gastritis without bleeding: Secondary | ICD-10-CM | POA: Diagnosis not present

## 2015-12-06 DIAGNOSIS — G43909 Migraine, unspecified, not intractable, without status migrainosus: Secondary | ICD-10-CM | POA: Diagnosis not present

## 2015-12-06 DIAGNOSIS — Z9851 Tubal ligation status: Secondary | ICD-10-CM | POA: Insufficient documentation

## 2015-12-06 DIAGNOSIS — Z7951 Long term (current) use of inhaled steroids: Secondary | ICD-10-CM | POA: Diagnosis not present

## 2015-12-06 DIAGNOSIS — E538 Deficiency of other specified B group vitamins: Secondary | ICD-10-CM | POA: Diagnosis not present

## 2015-12-06 DIAGNOSIS — K2950 Unspecified chronic gastritis without bleeding: Secondary | ICD-10-CM | POA: Diagnosis not present

## 2015-12-06 DIAGNOSIS — K589 Irritable bowel syndrome without diarrhea: Secondary | ICD-10-CM | POA: Diagnosis not present

## 2015-12-06 DIAGNOSIS — K312 Hourglass stricture and stenosis of stomach: Secondary | ICD-10-CM | POA: Diagnosis not present

## 2015-12-06 DIAGNOSIS — Z91041 Radiographic dye allergy status: Secondary | ICD-10-CM | POA: Diagnosis not present

## 2015-12-06 DIAGNOSIS — K259 Gastric ulcer, unspecified as acute or chronic, without hemorrhage or perforation: Secondary | ICD-10-CM | POA: Insufficient documentation

## 2015-12-06 DIAGNOSIS — K2980 Duodenitis without bleeding: Secondary | ICD-10-CM | POA: Insufficient documentation

## 2015-12-06 DIAGNOSIS — E282 Polycystic ovarian syndrome: Secondary | ICD-10-CM | POA: Insufficient documentation

## 2015-12-06 DIAGNOSIS — Z7984 Long term (current) use of oral hypoglycemic drugs: Secondary | ICD-10-CM | POA: Insufficient documentation

## 2015-12-06 DIAGNOSIS — Z79899 Other long term (current) drug therapy: Secondary | ICD-10-CM | POA: Insufficient documentation

## 2015-12-06 DIAGNOSIS — I10 Essential (primary) hypertension: Secondary | ICD-10-CM | POA: Insufficient documentation

## 2015-12-06 DIAGNOSIS — Z791 Long term (current) use of non-steroidal anti-inflammatories (NSAID): Secondary | ICD-10-CM | POA: Insufficient documentation

## 2015-12-06 DIAGNOSIS — Z01818 Encounter for other preprocedural examination: Secondary | ICD-10-CM | POA: Insufficient documentation

## 2015-12-06 DIAGNOSIS — K269 Duodenal ulcer, unspecified as acute or chronic, without hemorrhage or perforation: Secondary | ICD-10-CM | POA: Diagnosis not present

## 2015-12-06 DIAGNOSIS — Z8249 Family history of ischemic heart disease and other diseases of the circulatory system: Secondary | ICD-10-CM | POA: Insufficient documentation

## 2015-12-06 DIAGNOSIS — K2970 Gastritis, unspecified, without bleeding: Secondary | ICD-10-CM | POA: Diagnosis not present

## 2015-12-06 DIAGNOSIS — Z833 Family history of diabetes mellitus: Secondary | ICD-10-CM | POA: Diagnosis not present

## 2015-12-06 DIAGNOSIS — K210 Gastro-esophageal reflux disease with esophagitis: Secondary | ICD-10-CM | POA: Diagnosis present

## 2015-12-06 DIAGNOSIS — K2990 Gastroduodenitis, unspecified, without bleeding: Secondary | ICD-10-CM | POA: Diagnosis not present

## 2015-12-06 DIAGNOSIS — Z6841 Body Mass Index (BMI) 40.0 and over, adult: Secondary | ICD-10-CM | POA: Diagnosis not present

## 2015-12-06 DIAGNOSIS — K279 Peptic ulcer, site unspecified, unspecified as acute or chronic, without hemorrhage or perforation: Secondary | ICD-10-CM | POA: Diagnosis not present

## 2015-12-06 HISTORY — DX: Irritable bowel syndrome, unspecified: K58.9

## 2015-12-06 HISTORY — DX: Migraine, unspecified, not intractable, without status migrainosus: G43.909

## 2015-12-06 HISTORY — DX: Morbid (severe) obesity due to excess calories: E66.01

## 2015-12-06 HISTORY — DX: Personal history of other infectious and parasitic diseases: Z86.19

## 2015-12-06 HISTORY — DX: Cellulitis of abdominal wall: L03.311

## 2015-12-06 HISTORY — DX: Deficiency of other specified B group vitamins: E53.8

## 2015-12-06 HISTORY — PX: ESOPHAGOGASTRODUODENOSCOPY (EGD) WITH PROPOFOL: SHX5813

## 2015-12-06 HISTORY — DX: Polycystic ovarian syndrome: E28.2

## 2015-12-06 HISTORY — DX: Chondrocostal junction syndrome (tietze): M94.0

## 2015-12-06 HISTORY — DX: Personal history of other diseases of the female genital tract: Z87.42

## 2015-12-06 LAB — POCT PREGNANCY, URINE: Preg Test, Ur: NEGATIVE

## 2015-12-06 SURGERY — ESOPHAGOGASTRODUODENOSCOPY (EGD) WITH PROPOFOL
Anesthesia: General

## 2015-12-06 MED ORDER — SODIUM CHLORIDE 0.9 % IV SOLN
INTRAVENOUS | Status: DC
Start: 2015-12-06 — End: 2015-12-06

## 2015-12-06 MED ORDER — SODIUM CHLORIDE 0.9 % IV SOLN
INTRAVENOUS | Status: DC
Start: 2015-12-06 — End: 2015-12-06
  Administered 2015-12-06: 1000 mL via INTRAVENOUS

## 2015-12-06 MED ORDER — PROPOFOL 10 MG/ML IV BOLUS
INTRAVENOUS | Status: DC | PRN
  Administered 2015-12-06: 75 mg via INTRAVENOUS
  Administered 2015-12-06: 50 mg via INTRAVENOUS

## 2015-12-06 MED ORDER — PROPOFOL 500 MG/50ML IV EMUL
INTRAVENOUS | Status: DC | PRN
  Administered 2015-12-06: 100 ug/kg/min via INTRAVENOUS

## 2015-12-06 MED ORDER — GLYCOPYRROLATE 0.2 MG/ML IJ SOLN
INTRAMUSCULAR | Status: DC | PRN
  Administered 2015-12-06: 0.2 mg via INTRAVENOUS

## 2015-12-06 MED ORDER — LACTATED RINGERS IV SOLN
INTRAVENOUS | Status: DC | PRN
  Administered 2015-12-06: 09:00:00 via INTRAVENOUS

## 2015-12-06 NOTE — H&P (Signed)
Primary Care Physician:  Rusty Aus, MD Primary Gastroenterologist:  Dr. Vira Agar  Pre-Procedure History & Physical: HPI:  Kathryn Coffey is a 45 y.o. female is here for an endoscopy.   Past Medical History  Diagnosis Date  . Hypertension   . Abdominal wall cellulitis 2013 and 2014  . B12 deficiency   . Costochondritis   . History of Clostridium difficile colitis   . History of abnormal cervical Pap smear   . IBS (irritable bowel syndrome)   . Migraine   . Morbid obesity ([place])     s/p attempted gastric banding now decompressed  . PCOS (polycystic ovarian syndrome)     Past Surgical History  Procedure Laterality Date  . Tonsillectomy    . Cholecystectomy    . Laparoscopic gastric banding    . Cesarean section    . Ovary surgery      x2  . Esophagogastroduodenoscopy    . Colonoscopy    . Tubal ligation      Prior to Admission medications   Medication Sig Start Date End Date Taking? Authorizing Provider  albuterol (PROVENTIL HFA;VENTOLIN HFA) 108 (90 Base) MCG/ACT inhaler Inhale 2 puffs into the lungs every 6 (six) hours as needed for wheezing or shortness of breath.   Yes Historical Provider, MD  buPROPion (WELLBUTRIN XL) 150 MG 24 hr tablet Take 150 mg by mouth. 04/15/13  Yes Historical Provider, MD  cyanocobalamin 1000 MCG tablet Take 1,000 mcg by mouth daily.   Yes Historical Provider, MD  fexofenadine (ALLEGRA) 180 MG tablet Take 180 mg by mouth.   Yes Historical Provider, MD  fluticasone (FLONASE) 50 MCG/ACT nasal spray Place 2 sprays into both nostrils daily.   Yes Historical Provider, MD  Liraglutide (VICTOZA) 18 MG/3ML SOPN Inject 1.8 mg into the skin. 04/07/15  Yes Historical Provider, MD  losartan-hydrochlorothiazide (HYZAAR) 100-25 MG tablet Take by mouth.   Yes Historical Provider, MD  ETODOLAC PO Take by mouth as needed.    Historical Provider, MD  Multiple Vitamin (MULTI-VITAMINS) TABS Take by mouth.    Historical Provider, MD  omeprazole (PRILOSEC) 20 MG  capsule Take by mouth.    Historical Provider, MD  traMADol (ULTRAM) 50 MG tablet Take by mouth. 09/03/15   Historical Provider, MD    Allergies as of 11/29/2015 - Review Complete 11/08/2015  Allergen Reaction Noted  . Ivp dye [iodinated diagnostic agents]  08/23/2015    History reviewed. No pertinent family history.  Social History   Social History  . Marital Status: Married    Spouse Name: N/A  . Number of Children: N/A  . Years of Education: N/A   Occupational History  . Not on file.   Social History Main Topics  . Smoking status: Never Smoker   . Smokeless tobacco: Not on file  . Alcohol Use: 0.0 oz/week    0 Standard drinks or equivalent per week     Comment: maybe 1 drink per month  . Drug Use: No  . Sexual Activity: Not on file   Other Topics Concern  . Not on file   Social History Narrative    Review of Systems: See HPI, otherwise negative ROS  Physical Exam: BP 142/89 mmHg  Pulse 73  Temp(Src) 99.9 F (37.7 C) (Tympanic)  Resp 18  Ht 5\' 5"  (1.651 m)  Wt 140.615 kg (310 lb)  BMI 51.59 kg/m2  SpO2 99% General:   Alert,  pleasant and cooperative in NAD Head:  Normocephalic and atraumatic. Neck:  Supple; no masses or thyromegaly. Lungs:  Clear throughout to auscultation.    Heart:  Regular rate and rhythm. Abdomen:  Soft, nontender and nondistended. Normal bowel sounds, without guarding, and without rebound.   Neurologic:  Alert and  oriented x4;  grossly normal neurologically.  Impression/Plan: Kathryn Coffey is here for an endoscopy to be performed for pre op evaluation for weight loss surgery.  Risks, benefits, limitations, and alternatives regarding  endoscopy have been reviewed with the patient.  Questions have been answered.  All parties agreeable.   Gaylyn Cheers, MD  12/06/2015, 9:29 AM

## 2015-12-06 NOTE — Transfer of Care (Signed)
Immediate Anesthesia Transfer of Care Note  Patient: Kathryn Coffey  Procedure(s) Performed: Procedure(s): ESOPHAGOGASTRODUODENOSCOPY (EGD) WITH PROPOFOL (N/A)  Patient Location: PACU and Endoscopy Unit  Anesthesia Type:General  Level of Consciousness: awake, alert  and oriented  Airway & Oxygen Therapy: Patient Spontanous Breathing  Post-op Assessment: Report given to RN and Post -op Vital signs reviewed and stable  Post vital signs: Reviewed and stable  Last Vitals:  Filed Vitals:   12/06/15 0840  BP: 142/89  Pulse: 73  Temp: 37.7 C  Resp: 18    Complications: No apparent anesthesia complications

## 2015-12-06 NOTE — Anesthesia Postprocedure Evaluation (Signed)
Anesthesia Post Note  Patient: Kathryn Coffey  Procedure(s) Performed: Procedure(s) (LRB): ESOPHAGOGASTRODUODENOSCOPY (EGD) WITH PROPOFOL (N/A)  Patient location during evaluation: PACU Anesthesia Type: General Level of consciousness: awake and alert Pain management: pain level controlled Vital Signs Assessment: post-procedure vital signs reviewed and stable Respiratory status: spontaneous breathing, nonlabored ventilation, respiratory function stable and patient connected to nasal cannula oxygen Cardiovascular status: blood pressure returned to baseline and stable Postop Assessment: no signs of nausea or vomiting Anesthetic complications: no    Last Vitals:  Filed Vitals:   12/06/15 0959 12/06/15 1009  BP: 137/89 138/87  Pulse: 79 72  Temp: 36.4 C   Resp: 19 19    Last Pain: There were no vitals filed for this visit.               Molli Barrows

## 2015-12-06 NOTE — Op Note (Signed)
[facility] Gastroenterology Patient Name: Kathryn Coffey Procedure Date: 12/06/2015 9:29 AM MRN: [id number] Account #: [id number] Date of Birth: [birth year] Admit Type: Outpatient Age: 45 Room: [facility] ENDO ROOM 1 Gender: Female Note Status: Finalized Procedure:            Upper GI endoscopy Indications:          Preoperative assessment for bariatric surgery to treat                        morbid obesity Providers:            Manya Silvas, MD Referring MD:         Rusty Aus, MD (Referring MD) Medicines:            Propofol per Anesthesia Complications:        No immediate complications. Procedure:            Pre-Anesthesia Assessment:                       - After reviewing the risks and benefits, the patient                        was deemed in satisfactory condition to undergo the                        procedure.                       After obtaining informed consent, the endoscope was                        passed under direct vision. Throughout the procedure,                        the patient's blood pressure, pulse, and oxygen                        saturations were monitored continuously. The Endoscope                        was introduced through the mouth, and advanced to the                        second part of duodenum. The upper GI endoscopy was                        accomplished without difficulty. The patient tolerated                        the procedure well. Findings:      The examined esophagus was normal. GEJ 39-40cm.      Extrinsic compression on the stomach was found in the cardia/proximal       gastric body. This is from the lap band.      Diffuse moderate inflammation characterized by erythema, friability and       granularity was found in the gastric body. Biopsies were taken with a       cold forceps for histology. Biopsies were taken with a cold forceps for       Helicobacter pylori testing.      Many non-bleeding superficial  gastric  ulcers with no stigmata of       bleeding were found in the gastric antrum. Some round and some linear.       Biopsies were taken with a cold forceps for histology. Biopsies were       taken with a cold forceps for Helicobacter pylori testing.      One non-bleeding linear duodenal ulcer with no stigmata of bleeding was       found in the duodenal bulb. The lesion was 7 mm in largest dimension.       Biopsies were taken with a cold forceps for histology. Surrounding       mucosa was coarse, erythematous suggesting duodenitis.      The second portion of the duodenum was normal. Impression:           - Normal esophagus.                       - Extrinsic compression in the gastric body.                       - Gastritis. Biopsied.                       - Non-bleeding gastric ulcers with no stigmata of                        bleeding. Biopsied.                       - One non-bleeding duodenal ulcer with no stigmata of                        bleeding. Biopsied.                       - Normal second portion of the duodenum. Recommendation:       - Await pathology results. Manya Silvas, MD 12/06/2015 10:18:14 AM This report has been signed electronically. Number of Addenda: 0 Note Initiated On: 12/06/2015 9:29 AM      [facility]

## 2015-12-06 NOTE — Anesthesia Preprocedure Evaluation (Signed)
Anesthesia Evaluation  Patient identified by MRN, date of birth, ID band Patient awake    Reviewed: Allergy & Precautions, H&P , NPO status , Patient's Chart, lab work & pertinent test results, reviewed documented beta blocker date and time   Airway Mallampati: II   Neck ROM: full    Dental  (+) Poor Dentition   Pulmonary neg pulmonary ROS,    Pulmonary exam normal        Cardiovascular hypertension, negative cardio ROS Normal cardiovascular exam     Neuro/Psych negative neurological ROS  negative psych ROS   GI/Hepatic negative GI ROS, Neg liver ROS,   Endo/Other  negative endocrine ROS  Renal/GU negative Renal ROS  negative genitourinary   Musculoskeletal   Abdominal   Peds  Hematology negative hematology ROS (+)   Anesthesia Other Findings Past Medical History:   Hypertension                                                 Abdominal wall cellulitis                       2013 and *   B12 deficiency                                               Costochondritis                                              History of Clostridium difficile colitis                     History of abnormal cervical Pap smear                       IBS (irritable bowel syndrome)                               Migraine                                                     Morbid obesity (HCC)                                           Comment:s/p attempted gastric banding now decompressed   PCOS (polycystic ovarian syndrome)                         Past Surgical History:   TONSILLECTOMY                                                 CHOLECYSTECTOMY  LAPAROSCOPIC GASTRIC BANDING                                  CESAREAN SECTION                                              OVARY SURGERY                                                   Comment:x2   ESOPHAGOGASTRODUODENOSCOPY                                     COLONOSCOPY                                                   TUBAL LIGATION                                                Reproductive/Obstetrics                             Anesthesia Physical Anesthesia Plan  ASA: III  Anesthesia Plan: General   Post-op Pain Management:    Induction:   Airway Management Planned:   Additional Equipment:   Intra-op Plan:   Post-operative Plan:   Informed Consent: I have reviewed the patients History and Physical, chart, labs and discussed the procedure including the risks, benefits and alternatives for the proposed anesthesia with the patient or authorized representative who has indicated his/her understanding and acceptance.   Dental Advisory Given  Plan Discussed with: CRNA  Anesthesia Plan Comments:         Anesthesia Quick Evaluation

## 2015-12-07 LAB — SURGICAL PATHOLOGY

## 2015-12-08 DIAGNOSIS — K449 Diaphragmatic hernia without obstruction or gangrene: Secondary | ICD-10-CM | POA: Insufficient documentation

## 2015-12-08 DIAGNOSIS — K219 Gastro-esophageal reflux disease without esophagitis: Secondary | ICD-10-CM | POA: Diagnosis not present

## 2015-12-08 DIAGNOSIS — K259 Gastric ulcer, unspecified as acute or chronic, without hemorrhage or perforation: Secondary | ICD-10-CM | POA: Diagnosis not present

## 2015-12-08 DIAGNOSIS — I10 Essential (primary) hypertension: Secondary | ICD-10-CM | POA: Diagnosis not present

## 2015-12-08 DIAGNOSIS — Z9884 Bariatric surgery status: Secondary | ICD-10-CM | POA: Diagnosis not present

## 2015-12-09 ENCOUNTER — Encounter: Payer: Self-pay | Admitting: Dietician

## 2015-12-09 ENCOUNTER — Encounter: Payer: 59 | Attending: Bariatrics | Admitting: Dietician

## 2015-12-09 DIAGNOSIS — R7303 Prediabetes: Secondary | ICD-10-CM | POA: Diagnosis not present

## 2015-12-09 DIAGNOSIS — E669 Obesity, unspecified: Secondary | ICD-10-CM | POA: Diagnosis not present

## 2015-12-09 DIAGNOSIS — I10 Essential (primary) hypertension: Secondary | ICD-10-CM | POA: Diagnosis not present

## 2015-12-09 NOTE — Progress Notes (Signed)
Medical Nutrition Therapy: Visit start time: [id number]  end time: 1630  Assessment:  Diagnosis: obesity Medical history changes: no changes Psychosocial issues/ stress concerns: none  Current weight: 318.4lbs  Height: 5'5" Medications, supplement changes: reviewed list in chart with patient.  Progress and evaluation: Patient reports excess hunger since removal of lap band; weight gain of 3lbs in past 1-2 weeks.         She will begin liver reduction diet tomorrow.          She has been connecting with bariatric surgery support website, and has close friends and family providing support.         She is working on Geologist, engineering products for liver reduction as well as post-surgery diet.    Physical activity: no routine exercise. Dog walking and playing basketball with sons.   Will be working from home next month and will increase exercise.   Dietary Intake:  Usual eating pattern includes 3 meals and 2 snacks per day. Dining out frequency: 1 meals per week.  Breakfast: light breakfast sandwich Snack: fruit or granola bar Lunch: salad with grilled chicken or wrap.  Snack: fruit or popcorn Supper: grilled lean meats and vegetables,riced cauliflower Snack: occasionally popcorn Beverages: water, some stevia-sweetened beverages. No soda or caffeine.   Nutrition Care Education: Topics covered: weight management, bariatric diet  Bariatric diet: Reviewed pre-op diet goals and preparation; protein and fluid needs; post-surgery diet stages and strategies to prevent complications and side effects.  Other lifestyle changes:  Importance of regular exercise in weight management  Nutritional Diagnosis:  [place]-3.3 Overweight/obesity As related to recent removal of lap band, history of excess calories.  As evidenced by patient report.  Intervention: Discussion as noted above.   Patient voices understanding of the bariatric diet and feels ready to comply with diet guidelines and restrictions.    She has a  support system in place.    From a nutrition standpoint, she is ready to proceed with the bariatric surgery.   Education Materials given:  Marland Kitchen Goals/ instructions . Stage II Liquid Protein Diet (AND)  Learner/ who was taught:  . Patient   Level of understanding: Marland Kitchen Verbalizes/ demonstrates competency  Demonstrated degree of understanding via:   Teach back Learning barriers: . None  Willingness to learn/ readiness for change: . Eager, change in progress  Monitoring and Evaluation:  Dietary intake, exercise, and body weight      follow up: 3-4 weeks after surgery

## 2015-12-09 NOTE — Patient Instructions (Signed)
   Begin liver reduction diet.   Continue pre-op preparations, such as chewing food thoroughly, and avoiding liquids while eating.   Schedule a post-surgery nutrition visit for 3-4 weeks after surgery.

## 2016-01-12 DIAGNOSIS — I10 Essential (primary) hypertension: Secondary | ICD-10-CM | POA: Diagnosis not present

## 2016-01-12 DIAGNOSIS — K259 Gastric ulcer, unspecified as acute or chronic, without hemorrhage or perforation: Secondary | ICD-10-CM | POA: Diagnosis not present

## 2016-01-12 DIAGNOSIS — K429 Umbilical hernia without obstruction or gangrene: Secondary | ICD-10-CM | POA: Insufficient documentation

## 2016-01-12 DIAGNOSIS — K219 Gastro-esophageal reflux disease without esophagitis: Secondary | ICD-10-CM | POA: Diagnosis not present

## 2016-01-12 DIAGNOSIS — K449 Diaphragmatic hernia without obstruction or gangrene: Secondary | ICD-10-CM | POA: Diagnosis not present

## 2016-01-19 ENCOUNTER — Encounter: Payer: Self-pay | Admitting: *Deleted

## 2016-01-19 ENCOUNTER — Encounter
Admission: RE | Admit: 2016-01-19 | Discharge: 2016-01-19 | Disposition: A | Discharge status: Home / Self Care | Payer: 59 | Source: Ambulatory Visit | Attending: Bariatrics | Admitting: Bariatrics

## 2016-01-19 DIAGNOSIS — Z01812 Encounter for preprocedural laboratory examination: Secondary | ICD-10-CM | POA: Diagnosis not present

## 2016-01-19 HISTORY — DX: Congenital malformation of stomach, unspecified: Q40.3

## 2016-01-19 HISTORY — DX: Unspecified asthma, uncomplicated: J45.909

## 2016-01-19 HISTORY — DX: Type 2 diabetes mellitus without complications: E11.9

## 2016-01-19 LAB — TYPE AND SCREEN
ABO/RH(D): O POS
Antibody Screen: NEGATIVE

## 2016-01-19 LAB — POTASSIUM: Potassium: 3.4 mmol/L — ABNORMAL LOW (ref 3.5–5.1)

## 2016-01-19 NOTE — Pre-Procedure Instructions (Addendum)
LEFT MESSAGE FOR CONNIE RE DR Ginette Otto ORDER FOR EMEND. IT IS NOT AVAILABLE AT [facility]. REQUESTED CALL BACK. CLEARED LOW RISK BY CARDIOLOGIST 11/10/15

## 2016-01-19 NOTE — Patient Instructions (Addendum)
  Your procedure is scheduled on: 01/25/16 Report to Day Surgery. [facility] SECOND FLOOR To find out your arrival time please call [phone] between 1PM - 3PM on 01/24/16.  Remember: Instructions that are not followed completely may result in serious medical risk, up to and including death, or upon the discretion of your surgeon and anesthesiologist your surgery may need to be rescheduled.    _X___ 1. Do not eat food or drink liquids after midnight. No gum chewing or hard candies.     _X___ 2. No Alcohol for 24 hours before or after surgery.   ____ 3. Bring all medications with you on the day of surgery if instructed.    _X___ 4. Notify your doctor if there is any change in your medical condition     (cold, fever, infections).     Do not wear jewelry, make-up, hairpins, clips or nail polish.  Do not wear lotions, powders, or perfumes. You may wear deodorant.  Do not shave 48 hours prior to surgery. Men may shave face and neck.  Do not bring valuables to the hospital.    [facility] is not responsible for any belongings or valuables.               Contacts, dentures or bridgework may not be worn into surgery.  Leave your suitcase in the car. After surgery it may be brought to your room.  For patients admitted to the hospital, discharge time is determined by your                treatment team.   Patients discharged the day of surgery will not be allowed to drive home.   Please read over the following fact sheets that you were given:   Surgical Site Infection Prevention   ____ Take these medicines the morning of surgery with A SIP OF WATER:    1. NONE  2.   3.   4.  5.  6.  ____ Fleet Enema (as directed)   __X__ Use CHG Soap as directed  _X___ Use inhalers on the day of surgery  ____ Stop metformin 2 days prior to surgery    ____ Take 1/2 of usual insulin dose the night before surgery and none on the morning of surgery.   ____ Stop Coumadin/Plavix/aspirin on   ____  Stop Anti-inflammatories on    ____ Stop supplements until after surgery.    ____ Bring C-Pap to the hospital.

## 2016-01-20 LAB — ABO/RH: ABO/RH(D): O POS

## 2016-01-25 ENCOUNTER — Ambulatory Visit: Payer: 59 | Admitting: Anesthesiology

## 2016-01-25 ENCOUNTER — Encounter
Admission: RE | Disposition: A | Discharge status: Home / Self Care | Payer: Self-pay | Source: Ambulatory Visit | Attending: Bariatrics

## 2016-01-25 ENCOUNTER — Inpatient Hospital Stay
Admission: RE | Admit: 2016-01-25 | Discharge: 2016-01-27 | DRG: 621 | Disposition: A | Discharge status: Home / Self Care | Payer: 59 | Source: Ambulatory Visit | Attending: Bariatrics | Admitting: Bariatrics

## 2016-01-25 ENCOUNTER — Encounter: Payer: Self-pay | Admitting: *Deleted

## 2016-01-25 DIAGNOSIS — Z6841 Body Mass Index (BMI) 40.0 and over, adult: Secondary | ICD-10-CM

## 2016-01-25 DIAGNOSIS — G4730 Sleep apnea, unspecified: Secondary | ICD-10-CM | POA: Diagnosis present

## 2016-01-25 DIAGNOSIS — K210 Gastro-esophageal reflux disease with esophagitis: Secondary | ICD-10-CM | POA: Diagnosis present

## 2016-01-25 DIAGNOSIS — Z9884 Bariatric surgery status: Secondary | ICD-10-CM

## 2016-01-25 DIAGNOSIS — J45909 Unspecified asthma, uncomplicated: Secondary | ICD-10-CM | POA: Diagnosis present

## 2016-01-25 DIAGNOSIS — K660 Peritoneal adhesions (postprocedural) (postinfection): Secondary | ICD-10-CM | POA: Diagnosis present

## 2016-01-25 DIAGNOSIS — R1319 Other dysphagia: Secondary | ICD-10-CM | POA: Diagnosis present

## 2016-01-25 DIAGNOSIS — R1310 Dysphagia, unspecified: Secondary | ICD-10-CM | POA: Diagnosis not present

## 2016-01-25 DIAGNOSIS — K228 Other specified diseases of esophagus: Secondary | ICD-10-CM | POA: Diagnosis present

## 2016-01-25 DIAGNOSIS — K449 Diaphragmatic hernia without obstruction or gangrene: Secondary | ICD-10-CM | POA: Diagnosis present

## 2016-01-25 DIAGNOSIS — I10 Essential (primary) hypertension: Secondary | ICD-10-CM | POA: Diagnosis not present

## 2016-01-25 HISTORY — DX: Gastro-esophageal reflux disease without esophagitis: K21.9

## 2016-01-25 HISTORY — PX: LAPAROSCOPIC GASTRIC RESTRICTIVE DUODENAL PROCEDURE (DUODENAL SWITCH): SHX6667

## 2016-01-25 HISTORY — DX: Personal history of other diseases of the digestive system: Z87.19

## 2016-01-25 HISTORY — PX: UMBILICAL HERNIA REPAIR: SHX196

## 2016-01-25 LAB — GLUCOSE, CAPILLARY: Glucose-Capillary: 158 mg/dL — ABNORMAL HIGH (ref 65–99)

## 2016-01-25 LAB — POCT I-STAT 4, (NA,K, GLUC, HGB,HCT)
Glucose, Bld: 144 mg/dL — ABNORMAL HIGH (ref 65–99)
HCT: 45 % (ref 36.0–46.0)
Hemoglobin: 15.3 g/dL — ABNORMAL HIGH (ref 12.0–15.0)
Potassium: 3.6 mmol/L (ref 3.5–5.1)
Sodium: 138 mmol/L (ref 135–145)

## 2016-01-25 LAB — POCT PREGNANCY, URINE: Preg Test, Ur: NEGATIVE

## 2016-01-25 SURGERY — REMOVAL, GASTRIC BAND, LAPAROSCOPIC
Anesthesia: General | Site: Abdomen | Wound class: Clean

## 2016-01-25 MED ORDER — DEXAMETHASONE SODIUM PHOSPHATE 10 MG/ML IJ SOLN
INTRAMUSCULAR | Status: DC | PRN
  Administered 2016-01-25: 5 mg via INTRAVENOUS

## 2016-01-25 MED ORDER — ACETAMINOPHEN 160 MG/5ML PO SOLN
325.0000 mg | ORAL | Status: DC | PRN
  Filled 2016-01-25: qty 20.3

## 2016-01-25 MED ORDER — SODIUM CHLORIDE 0.9 % IV SOLN
INTRAVENOUS | Status: DC | PRN
  Administered 2016-01-25: 60 mL

## 2016-01-25 MED ORDER — ONDANSETRON HCL 4 MG/2ML IJ SOLN
INTRAMUSCULAR | Status: DC | PRN
  Administered 2016-01-25: 4 mg via INTRAVENOUS

## 2016-01-25 MED ORDER — FENTANYL CITRATE (PF) 100 MCG/2ML IJ SOLN
INTRAMUSCULAR | Status: AC
  Filled 2016-01-25: qty 2

## 2016-01-25 MED ORDER — OXYCODONE HCL 5 MG/5ML PO SOLN: 5.0000 mg | Freq: Once | ORAL | Status: DC | PRN

## 2016-01-25 MED ORDER — ACETAMINOPHEN 160 MG/5ML PO SOLN: 650.0000 mg | ORAL | Status: DC | PRN

## 2016-01-25 MED ORDER — SODIUM CHLORIDE 0.9 % IJ SOLN
INTRAMUSCULAR | Status: AC
  Filled 2016-01-25: qty 10

## 2016-01-25 MED ORDER — FENTANYL CITRATE (PF) 100 MCG/2ML IJ SOLN
INTRAMUSCULAR | Status: DC | PRN
  Administered 2016-01-25: 100 ug via INTRAVENOUS
  Administered 2016-01-25: 50 ug via INTRAVENOUS
  Administered 2016-01-25: 100 ug via INTRAVENOUS

## 2016-01-25 MED ORDER — HYDROMORPHONE HCL 1 MG/ML IJ SOLN
INTRAMUSCULAR | Status: DC | PRN
  Administered 2016-01-25: .6 mg via INTRAVENOUS
  Administered 2016-01-25: .4 mg via INTRAVENOUS

## 2016-01-25 MED ORDER — CEFAZOLIN SODIUM 1 G IJ SOLR
INTRAMUSCULAR | Status: DC | PRN
  Administered 2016-01-25: 2 g via INTRAMUSCULAR

## 2016-01-25 MED ORDER — PROMETHAZINE HCL 25 MG/ML IJ SOLN
12.5000 mg | Freq: Four times a day (QID) | INTRAMUSCULAR | Status: DC | PRN
  Administered 2016-01-25 – 2016-01-26 (×2): 12.5 mg via INTRAVENOUS
  Filled 2016-01-25 (×4): qty 1

## 2016-01-25 MED ORDER — SCOPOLAMINE 1 MG/3DAYS TD PT72
MEDICATED_PATCH | TRANSDERMAL | Status: AC
  Administered 2016-01-25: 1.5 mg via TRANSDERMAL
  Filled 2016-01-25: qty 1

## 2016-01-25 MED ORDER — FAMOTIDINE IN NACL 20-0.9 MG/50ML-% IV SOLN
20.0000 mg | Freq: Two times a day (BID) | INTRAVENOUS | Status: DC
  Administered 2016-01-26 – 2016-01-27 (×3): 20 mg via INTRAVENOUS
  Filled 2016-01-25 (×6): qty 50

## 2016-01-25 MED ORDER — HYDRALAZINE HCL 20 MG/ML IJ SOLN
INTRAMUSCULAR | Status: DC | PRN
  Administered 2016-01-25 (×2): 5 mg via INTRAVENOUS

## 2016-01-25 MED ORDER — SUGAMMADEX SODIUM 500 MG/5ML IV SOLN
INTRAVENOUS | Status: DC | PRN
  Administered 2016-01-25: 275 mg via INTRAVENOUS

## 2016-01-25 MED ORDER — LACTATED RINGERS IV SOLN
INTRAVENOUS | Status: DC | PRN
  Administered 2016-01-25: 12:00:00 via INTRAVENOUS

## 2016-01-25 MED ORDER — SODIUM CHLORIDE 0.9 % IV SOLN
INTRAVENOUS | Status: DC
  Administered 2016-01-25: 11:00:00 via INTRAVENOUS

## 2016-01-25 MED ORDER — BUPIVACAINE-EPINEPHRINE (PF) 0.25% -1:200000 IJ SOLN
INTRAMUSCULAR | Status: DC | PRN
  Administered 2016-01-25 (×2): 30 mL via PERINEURAL

## 2016-01-25 MED ORDER — FAMOTIDINE IN NACL 20-0.9 MG/50ML-% IV SOLN
20.0000 mg | Freq: Once | INTRAVENOUS | Status: AC
  Administered 2016-01-25: 20 mg via INTRAVENOUS

## 2016-01-25 MED ORDER — PROPOFOL 10 MG/ML IV BOLUS
INTRAVENOUS | Status: DC | PRN
  Administered 2016-01-25: 200 mg via INTRAVENOUS

## 2016-01-25 MED ORDER — ROCURONIUM BROMIDE 100 MG/10ML IV SOLN
INTRAVENOUS | Status: DC | PRN
  Administered 2016-01-25: 20 mg via INTRAVENOUS
  Administered 2016-01-25 (×2): 10 mg via INTRAVENOUS
  Administered 2016-01-25: 50 mg via INTRAVENOUS
  Administered 2016-01-25: 10 mg via INTRAVENOUS
  Administered 2016-01-25: 15 mg via INTRAVENOUS
  Administered 2016-01-25: 10 mg via INTRAVENOUS
  Administered 2016-01-25: 20 mg via INTRAVENOUS
  Administered 2016-01-25: 15 mg via INTRAVENOUS

## 2016-01-25 MED ORDER — ENOXAPARIN SODIUM 30 MG/0.3ML ~~LOC~~ SOLN
30.0000 mg | Freq: Two times a day (BID) | SUBCUTANEOUS | Status: DC
Start: 2016-01-26 — End: 2016-01-27
  Administered 2016-01-26 – 2016-01-27 (×3): 30 mg via SUBCUTANEOUS
  Filled 2016-01-25 (×3): qty 0.3

## 2016-01-25 MED ORDER — ACETAMINOPHEN 10 MG/ML IV SOLN
INTRAVENOUS | Status: AC
  Filled 2016-01-25: qty 100

## 2016-01-25 MED ORDER — FENTANYL CITRATE (PF) 100 MCG/2ML IJ SOLN
25.0000 ug | INTRAMUSCULAR | Status: DC | PRN
  Administered 2016-01-25 (×4): 25 ug via INTRAVENOUS

## 2016-01-25 MED ORDER — LIDOCAINE HCL (CARDIAC) 20 MG/ML IV SOLN
INTRAVENOUS | Status: DC | PRN
  Administered 2016-01-25: 100 mg via INTRAVENOUS

## 2016-01-25 MED ORDER — OXYCODONE HCL 5 MG PO TABS: 5.0000 mg | ORAL_TABLET | Freq: Once | ORAL | Status: DC | PRN

## 2016-01-25 MED ORDER — HYDROMORPHONE HCL 1 MG/ML IJ SOLN
0.2500 mg | INTRAMUSCULAR | Status: DC | PRN
  Administered 2016-01-25 (×2): 0.5 mg via INTRAVENOUS

## 2016-01-25 MED ORDER — ONDANSETRON HCL 4 MG/2ML IJ SOLN
4.0000 mg | INTRAMUSCULAR | Status: DC | PRN
  Administered 2016-01-25 – 2016-01-26 (×3): 4 mg via INTRAVENOUS
  Filled 2016-01-25 (×3): qty 2

## 2016-01-25 MED ORDER — IPRATROPIUM-ALBUTEROL 0.5-2.5 (3) MG/3ML IN SOLN: 3.0000 mL | Freq: Once | RESPIRATORY_TRACT | Status: DC

## 2016-01-25 MED ORDER — HYDROMORPHONE HCL 1 MG/ML IJ SOLN
INTRAMUSCULAR | Status: AC
  Filled 2016-01-25: qty 1

## 2016-01-25 MED ORDER — BUPIVACAINE-EPINEPHRINE (PF) 0.25% -1:200000 IJ SOLN
INTRAMUSCULAR | Status: AC
  Filled 2016-01-25: qty 60

## 2016-01-25 MED ORDER — ACETAMINOPHEN 10 MG/ML IV SOLN
INTRAVENOUS | Status: DC | PRN
  Administered 2016-01-25: 1000 mg via INTRAVENOUS

## 2016-01-25 MED ORDER — FAMOTIDINE IN NACL 20-0.9 MG/50ML-% IV SOLN
20.0000 mg | Freq: Two times a day (BID) | INTRAVENOUS | Status: DC
  Filled 2016-01-25 (×2): qty 50

## 2016-01-25 MED ORDER — SODIUM CHLORIDE 0.9 % IV SOLN
INTRAVENOUS | Status: DC | PRN
  Administered 2016-01-25: 12:00:00 via INTRAVENOUS

## 2016-01-25 MED ORDER — SODIUM CHLORIDE 0.9 % IJ SOLN
INTRAMUSCULAR | Status: AC
  Filled 2016-01-25: qty 50

## 2016-01-25 MED ORDER — BUPIVACAINE LIPOSOME 1.3 % IJ SUSP
INTRAMUSCULAR | Status: AC
  Filled 2016-01-25: qty 20

## 2016-01-25 MED ORDER — MIDAZOLAM HCL 2 MG/2ML IJ SOLN
INTRAMUSCULAR | Status: DC | PRN
  Administered 2016-01-25: 1 mg via INTRAVENOUS

## 2016-01-25 MED ORDER — SODIUM CHLORIDE 0.9 % IV SOLN
INTRAVENOUS | Status: DC
  Administered 2016-01-25 – 2016-01-27 (×5): via INTRAVENOUS

## 2016-01-25 MED ORDER — SUCCINYLCHOLINE CHLORIDE 20 MG/ML IJ SOLN
INTRAMUSCULAR | Status: DC | PRN
  Administered 2016-01-25: 100 mg via INTRAVENOUS

## 2016-01-25 MED ORDER — SCOPOLAMINE 1 MG/3DAYS TD PT72
1.0000 | MEDICATED_PATCH | Freq: Once | TRANSDERMAL | Status: DC
Start: 2016-01-25 — End: 2016-01-25
  Administered 2016-01-25: 1.5 mg via TRANSDERMAL

## 2016-01-25 MED ORDER — LACTATED RINGERS IV SOLN: INTRAVENOUS | Status: DC

## 2016-01-25 MED ORDER — DIPHENHYDRAMINE HCL 50 MG/ML IJ SOLN: 12.5000 mg | Freq: Four times a day (QID) | INTRAMUSCULAR | Status: DC | PRN

## 2016-01-25 MED ORDER — PREMIER PROTEIN SHAKE
2.0000 [oz_av] | Freq: Four times a day (QID) | ORAL | Status: DC
  Administered 2016-01-27 (×3): 2 [oz_av] via ORAL

## 2016-01-25 MED ORDER — CEFAZOLIN SODIUM-DEXTROSE 2-4 GM/100ML-% IV SOLN
INTRAVENOUS | Status: AC
  Administered 2016-01-25: 2 g via INTRAVENOUS
  Filled 2016-01-25: qty 100

## 2016-01-25 MED ORDER — ONDANSETRON HCL 4 MG/2ML IJ SOLN
INTRAMUSCULAR | Status: AC
  Administered 2016-01-25: 4 mg
  Filled 2016-01-25: qty 2

## 2016-01-25 MED ORDER — MORPHINE SULFATE (PF) 2 MG/ML IV SOLN
2.0000 mg | INTRAVENOUS | Status: DC | PRN
  Administered 2016-01-25 – 2016-01-26 (×2): 4 mg via INTRAVENOUS
  Administered 2016-01-26: 2 mg via INTRAVENOUS
  Administered 2016-01-26: 4 mg via INTRAVENOUS
  Administered 2016-01-26 – 2016-01-27 (×2): 2 mg via INTRAVENOUS
  Filled 2016-01-25: qty 1
  Filled 2016-01-25: qty 2
  Filled 2016-01-25 (×2): qty 1
  Filled 2016-01-25 (×2): qty 2

## 2016-01-25 MED ORDER — ENALAPRILAT 1.25 MG/ML IV SOLN
1.2500 mg | Freq: Four times a day (QID) | INTRAVENOUS | Status: DC | PRN
  Filled 2016-01-25: qty 1

## 2016-01-25 MED ORDER — OXYCODONE HCL 5 MG/5ML PO SOLN
5.0000 mg | ORAL | Status: DC | PRN
  Administered 2016-01-26 (×2): 10 mg via ORAL
  Filled 2016-01-25 (×2): qty 10

## 2016-01-25 MED ORDER — CEFAZOLIN SODIUM-DEXTROSE 2-4 GM/100ML-% IV SOLN: 2.0000 g | Freq: Once | INTRAVENOUS | Status: DC

## 2016-01-25 SURGICAL SUPPLY — 81 items
APPLIER CLIP ROT 10 11.4 M/L (STAPLE)
APPLIER CLIP ROT 13.4 12 LRG (CLIP)
BANDAGE ELASTIC 6 LF NS (GAUZE/BANDAGES/DRESSINGS) ×4 IMPLANT
BLADE SURG SZ11 CARB STEEL (BLADE) ×2 IMPLANT
CANISTER SUCT 1200ML W/VALVE (MISCELLANEOUS) ×2 IMPLANT
CATH TRAY 16F METER LATEX (MISCELLANEOUS) ×2 IMPLANT
CHLORAPREP W/TINT 26ML (MISCELLANEOUS) ×4 IMPLANT
CLEANER CAUTERY TIP 5X5 PAD (MISCELLANEOUS) ×1 IMPLANT
CLIP APPLIE ROT 10 11.4 M/L (STAPLE) IMPLANT
CLIP APPLIE ROT 13.4 12 LRG (CLIP) IMPLANT
DEFOGGER SCOPE WARMER CLEARIFY (MISCELLANEOUS) ×2 IMPLANT
DRAPE UTILITY 15X26 TOWEL STRL (DRAPES) ×8 IMPLANT
DRAPE UTILITY XL STRL (DRAPES) IMPLANT
DRSG TEGADERM 2-3/8X2-3/4 SM (GAUZE/BANDAGES/DRESSINGS) ×12 IMPLANT
DRSG TEGADERM 4X4.75 (GAUZE/BANDAGES/DRESSINGS) ×2 IMPLANT
ELECT REM PT RETURN 9FT ADLT (ELECTROSURGICAL) ×2
ELECTRODE REM PT RTRN 9FT ADLT (ELECTROSURGICAL) ×1 IMPLANT
FILTER LAP SMOKE EVAC STRL (MISCELLANEOUS) ×2 IMPLANT
GAUZE PETRO XEROFOAM 1X8 (MISCELLANEOUS) ×4 IMPLANT
GAUZE SPONGE NON-WVN 2X2 STRL (MISCELLANEOUS) ×5 IMPLANT
GLOVE BIO SURGEON STRL SZ7 (GLOVE) ×20 IMPLANT
GLOVE BIO SURGEON STRL SZ7.5 (GLOVE) ×4 IMPLANT
GLOVE BIOGEL PI IND STRL 8.5 (GLOVE) IMPLANT
GLOVE BIOGEL PI INDICATOR 8.5 (GLOVE)
GLOVE SURG SYN 8.0 (GLOVE) IMPLANT
GOWN STRL REUS W/ TWL LRG LVL3 (GOWN DISPOSABLE) ×3 IMPLANT
GOWN STRL REUS W/ TWL XL LVL3 (GOWN DISPOSABLE) ×2 IMPLANT
GOWN STRL REUS W/TWL LRG LVL3 (GOWN DISPOSABLE) ×3
GOWN STRL REUS W/TWL XL LVL3 (GOWN DISPOSABLE) ×2
GRASPER SUT TROCAR 14GX15 (MISCELLANEOUS) ×2 IMPLANT
HANDLE YANKAUER SUCT BULB TIP (MISCELLANEOUS) IMPLANT
IRRIGATION STRYKERFLOW (MISCELLANEOUS) ×1 IMPLANT
IRRIGATOR STRYKERFLOW (MISCELLANEOUS) ×2
IV NS 1000ML (IV SOLUTION) ×1
IV NS 1000ML BAXH (IV SOLUTION) ×1 IMPLANT
KIT RM TURNOVER STRD PROC AR (KITS) ×2 IMPLANT
LABEL OR SOLS (LABEL) ×2 IMPLANT
LIQUID BAND (GAUZE/BANDAGES/DRESSINGS) ×2 IMPLANT
NDL SAFETY 22GX1.5 (NEEDLE) ×2 IMPLANT
NEEDLE FILTER BLUNT 18X 1/2SAF (NEEDLE)
NEEDLE FILTER BLUNT 18X1 1/2 (NEEDLE) IMPLANT
NS IRRIG 1000ML POUR BTL (IV SOLUTION) ×2 IMPLANT
NS IRRIG 500ML POUR BTL (IV SOLUTION) ×2 IMPLANT
PACK LAP CHOLECYSTECTOMY (MISCELLANEOUS) ×2 IMPLANT
PAD CLEANER CAUTERY TIP 5X5 (MISCELLANEOUS) ×1
PENCIL ELECTRO HAND CTR (MISCELLANEOUS) ×2 IMPLANT
RELOAD STAPLER BLUE 60MM (STAPLE) ×1 IMPLANT
RELOAD STAPLER GOLD 60MM (STAPLE) ×6 IMPLANT
RELOAD STAPLER GREEN 60MM (STAPLE) ×6 IMPLANT
RELOAD STAPLER WHITE 60MM (STAPLE) ×3 IMPLANT
SHEARS HARMONIC ACE PLUS 45CM (MISCELLANEOUS) ×2 IMPLANT
SLEEVE ENDOPATH XCEL 5M (ENDOMECHANICALS) ×8 IMPLANT
SLEEVE GASTRECTOMY 40FR VISIGI (MISCELLANEOUS) ×2 IMPLANT
SPONGE VERSALON 2X2 STRL (MISCELLANEOUS) ×5
SPONGE XRAY 4X4 16PLY STRL (MISCELLANEOUS) ×2 IMPLANT
STAPLER ECHELON LONG 60 440 (INSTRUMENTS) ×2 IMPLANT
STAPLER RELOAD BLUE 60MM (STAPLE) ×2
STAPLER RELOAD GOLD 60MM (STAPLE) ×12
STAPLER RELOAD GREEN 60MM (STAPLE) ×12
STAPLER RELOAD WHITE 60MM (STAPLE) ×6
STAPLER SKIN PROX 35W (STAPLE) ×4 IMPLANT
SUT DEVICE BRAIDED 0X39 (SUTURE) ×4 IMPLANT
SUT DEVICE BRAIDED 2.0X39 (SUTURE) ×2 IMPLANT
SUT DVC VICRYL PGA 2.0X39 (SUTURE) ×4 IMPLANT
SUT MNCRL 4-0 (SUTURE) ×2
SUT MNCRL 4-0 27XMFL (SUTURE) ×2
SUT MNCRL AB 4-0 PS2 18 (SUTURE) ×4 IMPLANT
SUT VIC AB 0 CT2 27 (SUTURE) ×2 IMPLANT
SUT VIC AB 0 UR5 27 (SUTURE) IMPLANT
SUT VIC AB 2-0 CT1 (SUTURE) IMPLANT
SUT VIC AB 3-0 SH 27 (SUTURE) ×1
SUT VIC AB 3-0 SH 27X BRD (SUTURE) ×1 IMPLANT
SUT VICRYL 0 AB UR-6 (SUTURE) ×2 IMPLANT
SUTURE MNCRL 4-0 27XMF (SUTURE) ×2 IMPLANT
SYR 20CC LL (SYRINGE) ×2 IMPLANT
TROCAR 5M 150ML BLDLS (TROCAR) ×4 IMPLANT
TROCAR BLADELESS 15MM (ENDOMECHANICALS) IMPLANT
TROCAR XCEL 12X100 BLDLESS (ENDOMECHANICALS) ×2 IMPLANT
TROCAR XCEL NON-BLD 5MMX100MML (ENDOMECHANICALS) ×2 IMPLANT
TUBING INSUFFLATOR HEATED (MISCELLANEOUS) ×2 IMPLANT
WATER STERILE IRR 1000ML POUR (IV SOLUTION) ×2 IMPLANT

## 2016-01-25 NOTE — Anesthesia Procedure Notes (Signed)
Procedure Name: Intubation Date/Time: 01/25/2016 11:59 AM Performed by: Justus Memory Pre-anesthesia Checklist: Patient identified, Emergency Drugs available, Suction available and Patient being monitored Patient Re-evaluated:Patient Re-evaluated prior to inductionOxygen Delivery Method: Circle system utilized Preoxygenation: Pre-oxygenation with 100% oxygen Intubation Type: IV induction Ventilation: Mask ventilation without difficulty Laryngoscope Size: Mac and 4 Grade View: Grade I Tube type: Oral Tube size: 7.0 mm Number of attempts: 1 Airway Equipment and Method: Stylet (positioned with ramp) Placement Confirmation: ETT inserted through vocal cords under direct vision and breath sounds checked- equal and bilateral Secured at: 21 cm Tube secured with: Tape Dental Injury: Teeth and Oropharynx as per pre-operative assessment

## 2016-01-25 NOTE — Interval H&P Note (Signed)
History and Physical Interval Note:  01/25/2016 11:22 AM  Kathryn Coffey  has presented today for surgery, with the diagnosis of MORBID OBESITY   The various methods of treatment have been discussed with the patient and family. After consideration of risks, benefits and other options for treatment, the patient has consented to  Procedure(s): LAPAROSCOPIC REMOVAL OF GASTRIC BAND (N/A) LAPAROSCOPIC GASTRIC RESTRICTIVE DUODENAL PROCEDURE (DUODENAL SWITCH) (N/A) LAPAROSCOPIC UMBILICAL HERNIA (N/A) as a surgical intervention .  The patient's history has been reviewed, patient examined, no change in status, stable for surgery.  I have reviewed the patient's chart and labs.  Questions were answered to the patient's satisfaction.     Ladora Daniel

## 2016-01-25 NOTE — H&P (Signed)
History and physical on paper chart, no changes.

## 2016-01-25 NOTE — Progress Notes (Signed)
drsg to abd x8 dry/intact.

## 2016-01-25 NOTE — Anesthesia Postprocedure Evaluation (Signed)
Anesthesia Post Note  Patient: LIZ BAHAR  Procedure(s) Performed: Procedure(s) (LRB): LAPAROSCOPIC REMOVAL OF GASTRIC BAND (N/A) LAPAROSCOPIC GASTRIC RESTRICTIVE DUODENAL PROCEDURE (DUODENAL SWITCH) (N/A) LAPAROSCOPIC UMBILICAL HERNIA (N/A)  Patient location during evaluation: PACU Anesthesia Type: General Level of consciousness: awake and alert Pain management: pain level controlled Vital Signs Assessment: post-procedure vital signs reviewed and stable Respiratory status: spontaneous breathing, nonlabored ventilation, respiratory function stable and patient connected to nasal cannula oxygen Cardiovascular status: blood pressure returned to baseline and stable Postop Assessment: no signs of nausea or vomiting Anesthetic complications: no    Last Vitals:  Filed Vitals:   01/25/16 1853 01/25/16 1934  BP: 138/84 139/85  Pulse: 96 92  Temp: 36.6 C 36.4 C  Resp: 16 18    Last Pain:  Filed Vitals:   01/25/16 1934  PainSc: 2                  Precious Haws Piscitello

## 2016-01-25 NOTE — Anesthesia Preprocedure Evaluation (Addendum)
Anesthesia Evaluation  Patient identified by MRN, date of birth, ID band Patient awake    Reviewed: Allergy & Precautions, H&P , NPO status , Patient's Chart, lab work & pertinent test results  History of Anesthesia Complications Negative for: history of anesthetic complications  Airway Mallampati: II  TM Distance: >3 FB Neck ROM: full    Dental  (+) Poor Dentition   Pulmonary neg shortness of breath, asthma , sleep apnea ,    Pulmonary exam normal breath sounds clear to auscultation       Cardiovascular Exercise Tolerance: Good hypertension, (-) angina(-) Past MI and (-) DOE Normal cardiovascular exam Rhythm:regular Rate:Normal     Neuro/Psych  Headaches, negative psych ROS   GI/Hepatic Neg liver ROS, hiatal hernia, GERD  Controlled,  Endo/Other  diabetes, Type 2Morbid obesity  Renal/GU negative Renal ROS  negative genitourinary   Musculoskeletal   Abdominal   Peds  Hematology negative hematology ROS (+)   Anesthesia Other Findings Past Medical History:   Hypertension                                                 Abdominal wall cellulitis                       2013 and *   B12 deficiency                                               Costochondritis                                              History of Clostridium difficile colitis                     History of abnormal cervical Pap smear                       IBS (irritable bowel syndrome)                               Migraine                                                     Morbid obesity (HCC)                                           Comment:s/p attempted gastric banding now decompressed   PCOS (polycystic ovarian syndrome)                           History of hiatal hernia  GERD (gastroesophageal reflux disease)                       Gastric anomaly                                                Comment:multiple  small ulcers   Sleep apnea                                                    Comment:mild no cpap needed   Asthma                                                         Comment:allergic to grasses   Diabetes mellitus without complication ([place])                   Comment:pre  Past Surgical History:   TONSILLECTOMY                                                 CHOLECYSTECTOMY                                               LAPAROSCOPIC GASTRIC BANDING                                  CESAREAN SECTION                                              OVARY SURGERY                                                   Comment:x2   ESOPHAGOGASTRODUODENOSCOPY                                    COLONOSCOPY                                                   TUBAL LIGATION  ESOPHAGOGASTRODUODENOSCOPY (EGD) WITH PROPOFOL  N/A 12/06/2015      Comment:Procedure: ESOPHAGOGASTRODUODENOSCOPY (EGD)               WITH PROPOFOL;  Surgeon: Manya Silvas, MD;               Location: [facility] ENDOSCOPY;  Service: Endoscopy;               Laterality: N/A;  BMI    Body Mass Index   51.59 kg/m 2    Patient reports no problems with oxycodone  Reproductive/Obstetrics negative OB ROS                            Anesthesia Physical Anesthesia Plan  ASA: III  Anesthesia Plan: General ETT   Post-op Pain Management:    Induction:   Airway Management Planned:   Additional Equipment:   Intra-op Plan:   Post-operative Plan:   Informed Consent: I have reviewed the patients History and Physical, chart, labs and discussed the procedure including the risks, benefits and alternatives for the proposed anesthesia with the patient or authorized representative who has indicated his/her understanding and acceptance.   Dental Advisory Given  Plan Discussed with: Anesthesiologist, CRNA and Surgeon  Anesthesia Plan Comments:         Anesthesia  Quick Evaluation

## 2016-01-25 NOTE — Transfer of Care (Signed)
Immediate Anesthesia Transfer of Care Note  Patient: Kathryn Coffey  Procedure(s) Performed: Procedure(s): LAPAROSCOPIC REMOVAL OF GASTRIC BAND (N/A) LAPAROSCOPIC GASTRIC RESTRICTIVE DUODENAL PROCEDURE (DUODENAL SWITCH) (N/A) LAPAROSCOPIC UMBILICAL HERNIA (N/A)  Patient Location: PACU  Anesthesia Type:General  Level of Consciousness: awake  Airway & Oxygen Therapy: Patient Spontanous Breathing and Patient connected to face mask oxygen  Post-op Assessment: Report given to RN and Post -op Vital signs reviewed and stable  Post vital signs: Reviewed  Last Vitals:  Filed Vitals:   01/25/16 0937 01/25/16 1715  BP: 153/96 162/100  Pulse: 80 108  Temp: 36.6 C 37.2 C  Resp: 20 20    Last Pain: There were no vitals filed for this visit.       Complications: No apparent anesthesia complications

## 2016-01-25 NOTE — Op Note (Signed)
PATIENT: Kathryn Coffey 13-Oct-1970  PROCEDURE PERFORMED: Laparoscopic duodenal switch with biliary pancreatic diversion with extensive lysis of adhesions associated with removal of prior lap band and port the dissection required greater than 1-1/2 hours, repair of hiatal hernia.  PRE-OP DIAGNOSIS: MORBID OBESITY with indwelling lap band and multiple events of dysphagia associated with the lap band POST-OP DIAGNOSIS: MORBID OBESITY with significant scarring in the side of the lap band and associated scarring of the lower mediastinum associated with history of recurring esophageal dilatation, presence of a sliding hiatal hernia and herniated perigastric fatty tissue. ESTIMATED BLOOD LOSS: less than 100 mL SURGEON: Arletta Bale A  ASSISTANT: Liliane Bade PA  PROCEDURE NOTE: The patient was brought to the operating room and placed in the supine position. General anesthesia was obtained with orotracheal intubation. Foley catheter inserted sterilely. TED hose and Thromboguards applied. A foot board applied at the enatd of the operative bed. The chest and abdomen were sterilely prepped and draped. A 5 mm Optiview trocar introduced under direct visualization in the left upper quadrant of the abdomen. Pneumoperitoneum obtained. Four additional trocars introduced across the upper abdomen. The cecum and distal ileum identified. The small bowel was then followed approximate 300 cm, at which point it was secured to the gastrocolic ligament. A Nathanson liver retractor was introduced followed by elevation of the left lobe of the liver. There was noted be extensive perigastric adhesions of aspects of the left lobe of liver to the site of the prior fundoplication and lap band. These were addressed with use of the Harmonic scalpel freeing the lap band from the undersurface left lobe liver and also aspects of the caudate lobe. There was significant scarring around the lap band itself eventually this was dissected and  there the [facility] system. The investing capsule was divided by use of the Harmonic scalpel there are also several sutures in this area securing the buckle system to the surrounding fatty tissue. These were all divided. The anterior aspect of the stomach was addressed where dense scarring was again divided anterior to the band this then led to area where the stomach was involved in the fundoplication effect. The rolled stomach was freed from the medial stomach and perigastric fatty tissue by use of sharp dissection and use of the Harmonic scalpel. It was significant scarring of the fat superior to the band and there was a dense investing capsule noted be extending laterally. It was decided for better exposure short gastric vessel should be divided this was done with use of the Harmonic scalpel and the apex of the stent was noted again to be a thick investing capsule assistant with the prior band site this area was divided by use of the Harmonic scalpel there was significant scarring between the crural tissue medially and the undersurface of the cardia of the stomach. This was gradually separated by use of the Harmonic scalpel. The remainder significant indentation across the anterior stomach and also the upper lateral aspect the stomach. This is associated with an investing capsule. Prior to addressing this the and was unbuckled and delivered from the abdominal cavity with division of the tubing as it was noted to exit the anterior abdominal wall. The investing capsule then incised along the anterior medial aspect of the stomach this was then gradually mobilized away from the anterior and more lateral aspect of the stomach. Again it remained difficult to completely reduce the indentation of the stomach in this area. To improve exposure it was decided to free  some of the peritoneum overlying the upper stomach in area of the hiatus and in so doing there was noted be herniated fatty tissue extending into  the lower mediastinum. This was gradually reduced revealing a moderately prominent dilation of the hiatus. His felt that reduction of Foley contents would be appropriate to maximize full exposure of the upper stomach. Beginning on the area area of the right crural base the peritoneum was incised in addition this some scarring associated with the [employer] was divided by use the Harmonic scalpel the peritoneum incised along the lateral margin of the right crus freeing it from the medial margin the esophagus. Posterior scarring was divided resulting in release of the posterior vagal nerve. Blunt dissection was initiated in the posterior uterus along the over underlying aorta. Vascular fatty tissues divided by blunt dissection followed by use of the Harmonic scalpel. There was more significant adhesive nature from the esophagus to the pleural surfaces presuming associated recurring events of esophageal dilation present esophagitis. The right aspect of the esophagus gradually freed from the pleural and addition this portions of the esophagus free from the anterior margins of the hiatus and the undersurface of the epicardium. Mobilization of the left crus was initiated superiorly and carried down inferiorly were additional scarring was encountered along the crural margin this was gradually separated by use of the Harmonic scalpel leading to improved definition of the upper stomach. Further circumferential dissection of the lower esophagus was performed within the lower mediastinum further mobilizing the esophagus from the underlying aorta the right and left pleura and overlying pericardium. This dissection was extended superiorly over distance proximally 6-7 cm. To improve exposure and additional 5 mm trocar was introduced in the right upper abdomen. Eventually were able to deliver 1 and 1/2 cm of esophagus lying comfortably in the abdominal cavity. The patient then had identification and marking of the pylorus. Beginning  approximately 4 cm proximal to this there was division of vascular pedicles along the greater curvature of the stomach, this was extended cephalad over a distance of approximately 8 cm. Creation of a gastric sleeve effect was then initiated. The patient had a series of GI staple firings placed creating a medially based gastric tube effect. The first firing was a black load stapler placed in a relative transverse direction as was the next gold load firing. These were positioned in a way to avoid narrowing in the region of the incisura. Next a more vertical line of staples was placed parallel to the lesser curvature and ultimately brought out just lateral to the angle of His with the last 2 firings being green load staples to maximize occlusion an area where the thin prior surgery. As this was done the 10 [place] ViSiGi device which was placed in the antral region was gradually withdrawn to helped create a consistent tubular effect. The lateral stomach then freed from the gastrocolic and gastrosplenic ligament and removed via the right upper quadrant 12 mm trocar site. The residual vascular pedicles along the lower inner curve of the duodenum bulb were divided by use of the Harmonic scalpel, the peritoneum being scored immediately before this and also the lateral peritoneum also scored. Blunt dissection behind the duodenum divided by use of the Harmonic scalpel. This was ultimately brought out lateral to the duodenum, approximately 3 cm inferior to the pylorus.  The patient then had a blue load stapler introduced across the duodenum again approximately 3 cm inferior to the pylorus. This was used to divide the duodenum with  excellent hemostasis. Several residual vascular pedicles on the lateral aspect of the duodenum divided, allowing this to be brought more to the area of the midline. The patient then had an ileal-duodenal anastomosis constructed. This was done with securing seromuscular layers of the distal duodenal  staple line area and the antimesenteric border of the ileum. Enterotomies then made on the anterior aspect of the duodenum and opposing portion of the ileum. A full-thickness running 3-0 Polysorb suture used to complete the anastomosis, the suture line then reinforced anteriorly with an additional running 2-0 Polysorb suture. The intestine was occluded distally and insufflation of the gastric tube in area of anastomosis performed. A saline bath performed, no air leak identified. At this point the ViSiGi device was withdrawn.  The jejunum immediate proximal to the duodenal anastomosis was divided with white load gia stapler along with partial division of mesentery with harmonic scalpel. The alimentary limb was followed distally 50 cm at which point a jejunal jejunal anastomosis created between the new biliary limb and the common channel. This was created by an enterotomy on the antimesenteric margin of each portions of bowel followed by a white load staple firing at 35 mm to creat a common lumen. The resulting enterotomy was closed with a white load staple. Antitorsion sutures placed distal to the anastomosis and the mesenteric window closed with 2.0 surgidec suture. The hiatus was repaired this point with 2 interrupted 0 Ethibond sutures. At this point the fascia and peritoneum of the 12 mm trocar site was closed with 0 Vicryl suture as passed by a needle suture passing system under direct visualization. The pneumoperitoneum relieved. The trocars removed. The wounds injected with 0.25% Marcaine and closed with staples. Dry dressings applied. Prior to closure with staples the wounds also injected with diluted Experel. The patient allowed recover having tolerated the procedure well.

## 2016-01-26 ENCOUNTER — Encounter: Payer: Self-pay | Admitting: Bariatrics

## 2016-01-26 LAB — CBC WITH DIFFERENTIAL/PLATELET
Basophils Absolute: 0 10*3/uL (ref 0–0.1)
Basophils Relative: 0 %
Eosinophils Absolute: 0 10*3/uL (ref 0–0.7)
Eosinophils Relative: 0 %
HCT: 40 % (ref 35.0–47.0)
Hemoglobin: 13.4 g/dL (ref 12.0–16.0)
Lymphocytes Relative: 6 %
Lymphs Abs: 0.8 10*3/uL — ABNORMAL LOW (ref 1.0–3.6)
MCH: 30.6 pg (ref 26.0–34.0)
MCHC: 33.5 g/dL (ref 32.0–36.0)
MCV: 91.2 fL (ref 80.0–100.0)
Monocytes Absolute: 0.9 10*3/uL (ref 0.2–0.9)
Monocytes Relative: 6 %
Neutro Abs: 12.8 10*3/uL — ABNORMAL HIGH (ref 1.4–6.5)
Neutrophils Relative %: 88 %
Platelets: 287 10*3/uL (ref 150–440)
RBC: 4.39 MIL/uL (ref 3.80–5.20)
RDW: 14 % (ref 11.5–14.5)
WBC: 14.6 10*3/uL — ABNORMAL HIGH (ref 3.6–11.0)

## 2016-01-26 LAB — HEMOGLOBIN AND HEMATOCRIT, BLOOD
HCT: 39.6 % (ref 35.0–47.0)
Hemoglobin: 13.6 g/dL (ref 12.0–16.0)

## 2016-01-26 MED ORDER — PROCHLORPERAZINE EDISYLATE 5 MG/ML IJ SOLN
10.0000 mg | INTRAMUSCULAR | Status: DC | PRN
  Administered 2016-01-26 (×2): 10 mg via INTRAVENOUS
  Filled 2016-01-26 (×2): qty 2

## 2016-01-26 NOTE — Progress Notes (Signed)
Assessment/Plan: POD 1 Laparoscopic Duodenal Switch with double anastomosis, Lap band and port removal and Hiatal Hernia repair  1) Will start bariatric clear liquids. 2) Encourage ambulation - To decrease the risk of DVT's.  May remove SCD's when ambulating well. 3) Incentive spirometry use - To decrease any atelectasis from surgery and so we can wean off O2 therapy. 4) D/C Foley - To decrease the possibility of any post op urinary tract infections. 5) Dietary and Lovenox teaching if necessary. 6) Discharge within 24-48 hours if she continues on the same course.     LOS: 1 day    Subjective: Pt with a good course post-op. Positive Pain, positive nausea, negative Emesis, negative Flatus, negative  Bowel Movement, negative Ambulation.     Interval History: Pt had a long night with severe nausea. Just started PO attempts this am. Stating off well. Has been up walking.   Objective:  Vital signs in last 24 hours: Filed Vitals:   01/26/16 0006 01/26/16 0407  BP: 142/81 139/82  Pulse: 85 92  Temp: 97.9 F (36.6 C) 98.4 F (36.9 C)  Resp: 18 19    Intake/Output Summary (Last 24 hours) at 01/26/16 1302 Last data filed at 01/26/16 0900  Gross per 24 hour  Intake   4190 ml  Output    975 ml  Net   3215 ml        Physical Exam Abdomen; soft, N/D, appropriately tender, incisions C/D/I

## 2016-01-26 NOTE — Progress Notes (Signed)
INTERVENTION:  RD consulted for nutrition education regarding inpatient bariatric surgery.   RD provided "The Liquid Diet" handout from the Bariatric Surgery Guide from the [facility] [place]. This handout previously provided to patient prior to surgery is a duplicate copy. Discussed what foods/liquids are consistent with a Clear Liquid Diet and reinforced Key Concepts such as no carbonation, no caffeine, or sugar containing beverages. Provided methods to prevent dehydration and promote protein intake, using clock and sample fluid schedule. RD encouraged follow-up with outpatient dietitian after discharge.  Teach back method used. Spoke to husband as pt sleeping during visit  Expect good compliance.  NUTRITION DIAGNOSIS:  Food and nutrition knowledge related deficit related to recent bariatric surgery as evidenced by dietitian consult for nutrition education   GOAL:  Patient will be able to sip and tolerate CL within 24-48 hours  MONITOR:  Energy intake Digestive system  ASSESSMENT:  Pt s/p removal of gastric band and duodenal switch with hernia repair   Body mass index is 52.36 kg/(m^2).   Current diet order is bariatric clear liquids, patient is consuming approximately 15-65ml at this time.   Labs and medications reviewed.   LOW Care Level  Kathryn Coffey, [place], [place] (pager) Weekend/On-Call pager [phone])

## 2016-01-27 LAB — SURGICAL PATHOLOGY

## 2016-01-27 MED ORDER — KETOROLAC TROMETHAMINE 30 MG/ML IJ SOLN
30.0000 mg | Freq: Four times a day (QID) | INTRAMUSCULAR | Status: DC
  Administered 2016-01-27 (×2): 30 mg via INTRAVENOUS
  Filled 2016-01-27 (×2): qty 1

## 2016-01-27 MED ORDER — HYDROMORPHONE HCL 1 MG/ML IJ SOLN: 1.0000 mg | INTRAMUSCULAR | Status: DC | PRN

## 2016-01-27 NOTE — Discharge Summary (Signed)
Physician Discharge Summary   Patient ID: Kathryn Coffey [id number] y.o. 1970/09/24  Admit date: 01/25/2016  Discharge date and time: No discharge date for patient encounter.   Admitting Physician: Ladora Daniel, MD   Discharge Physician: Felicie Morn  Admission Diagnoses: MORBID OBESITY   Discharge Diagnoses: Morbid Obesity  Admission Condition: good  Discharged Condition: good  Indication for Admission: Surgery  Hospital Course: Pt was admitted for bariatric surgery which she tolerated well.  She was transferred to the PACU and ulitmately up to the floor.  POD 1 she had nausea and general post op pain, but her diet was advanced to clears.  She was not maintaining enough PO intake to go home so she stayed till POD 2.  On POD 2 the pt felt much better, had flatus and a b/m.  Tolerated fluids, ambulated about 6 times and had no nausea or emesis.  Do the pt was D/C'ed home  Consults: None  Significant Diagnostic Studies:   Treatments: surgery  Discharge Exam:  Disposition: 01-Home or Self Care  Patient Instructions:    Medication List    STOP taking these medications        buPROPion 150 MG 24 hr tablet  Commonly known as:  WELLBUTRIN XL     MULTI-VITAMINS Tabs     omeprazole 20 MG capsule  Commonly known as:  PRILOSEC     sucralfate 1 g tablet  Commonly known as:  CARAFATE      TAKE these medications        albuterol 108 (90 Base) MCG/ACT inhaler  Commonly known as:  PROVENTIL HFA;VENTOLIN HFA  Inhale 2 puffs into the lungs every 6 (six) hours as needed for wheezing or shortness of breath.     b complex vitamins tablet  Take 1 tablet by mouth daily.     fexofenadine 180 MG tablet  Commonly known as:  ALLEGRA  Take 180 mg by mouth.     fluticasone 50 MCG/ACT nasal spray  Commonly known as:  FLONASE  Place 2 sprays into both nostrils daily.     losartan-hydrochlorothiazide 100-25 MG tablet  Commonly known as:  HYZAAR  Take by mouth.     traMADol 50 MG tablet  Commonly known as:  ULTRAM  Take by mouth.     VICTOZA 18 MG/3ML Sopn  Generic drug:  Liraglutide  Inject 0.6 mg into the skin.       Activity: activity as tolerated Diet: bari clears Wound Care: none needed  Follow-up with Dr. Duke Salvia in 2 weeks.  Signed: Marton Redwood 01/27/2016 4:21 PM

## 2016-01-27 NOTE — Progress Notes (Signed)
Pt A and O x 4. VSS. Pt tolerating diet well. No complaints of pain or nausea. IV removed intact, pt received prescription from doctor previous to surgery. Pt voiced understanding of discharge instructions with no further questions. Pt discharged via wheelchair with nurse aide.

## 2016-03-21 DIAGNOSIS — E538 Deficiency of other specified B group vitamins: Secondary | ICD-10-CM | POA: Diagnosis not present

## 2016-03-21 DIAGNOSIS — I10 Essential (primary) hypertension: Secondary | ICD-10-CM | POA: Diagnosis not present

## 2016-03-31 DIAGNOSIS — S8392XA Sprain of unspecified site of left knee, initial encounter: Secondary | ICD-10-CM | POA: Diagnosis not present

## 2016-04-04 DIAGNOSIS — M2392 Unspecified internal derangement of left knee: Secondary | ICD-10-CM | POA: Diagnosis not present

## 2016-04-04 DIAGNOSIS — S8392XA Sprain of unspecified site of left knee, initial encounter: Secondary | ICD-10-CM | POA: Diagnosis not present

## 2016-04-04 DIAGNOSIS — S8391XA Sprain of unspecified site of right knee, initial encounter: Secondary | ICD-10-CM | POA: Diagnosis not present

## 2016-04-07 ENCOUNTER — Emergency Department
Admission: EM | Admit: 2016-04-07 | Discharge: 2016-04-07 | Disposition: A | Discharge status: Home / Self Care | Payer: 59 | Attending: Emergency Medicine | Admitting: Emergency Medicine

## 2016-04-07 ENCOUNTER — Emergency Department: Payer: 59

## 2016-04-07 ENCOUNTER — Encounter: Payer: Self-pay | Admitting: Emergency Medicine

## 2016-04-07 DIAGNOSIS — R109 Unspecified abdominal pain: Secondary | ICD-10-CM

## 2016-04-07 DIAGNOSIS — Z79899 Other long term (current) drug therapy: Secondary | ICD-10-CM | POA: Insufficient documentation

## 2016-04-07 DIAGNOSIS — R1084 Generalized abdominal pain: Secondary | ICD-10-CM | POA: Insufficient documentation

## 2016-04-07 DIAGNOSIS — J45909 Unspecified asthma, uncomplicated: Secondary | ICD-10-CM | POA: Diagnosis not present

## 2016-04-07 LAB — COMPREHENSIVE METABOLIC PANEL
ALT: 62 U/L — ABNORMAL HIGH (ref 14–54)
AST: 62 U/L — ABNORMAL HIGH (ref 15–41)
Albumin: 4.2 g/dL (ref 3.5–5.0)
Alkaline Phosphatase: 111 U/L (ref 38–126)
Anion gap: 9 (ref 5–15)
BUN: 19 mg/dL (ref 6–20)
CO2: 26 mmol/L (ref 22–32)
Calcium: 9.3 mg/dL (ref 8.9–10.3)
Chloride: 102 mmol/L (ref 101–111)
Creatinine, Ser: 0.62 mg/dL (ref 0.44–1.00)
GFR calc Af Amer: >60 mL/min (ref >60)
GFR calc non Af Amer: >60 mL/min (ref >60)
Glucose, Bld: 156 mg/dL — ABNORMAL HIGH (ref 65–99)
Potassium: 2.9 mmol/L — ABNORMAL LOW (ref 3.5–5.1)
Sodium: 137 mmol/L (ref 135–145)
Total Bilirubin: 1.2 mg/dL (ref 0.3–1.2)
Total Protein: 7.6 g/dL (ref 6.5–8.1)

## 2016-04-07 LAB — CBC
HCT: 45.6 % (ref 35.0–47.0)
Hemoglobin: 15.3 g/dL (ref 12.0–16.0)
MCH: 30.6 pg (ref 26.0–34.0)
MCHC: 33.5 g/dL (ref 32.0–36.0)
MCV: 91.5 fL (ref 80.0–100.0)
Platelets: 349 10*3/uL (ref 150–440)
RBC: 4.98 MIL/uL (ref 3.80–5.20)
RDW: 13.6 % (ref 11.5–14.5)
WBC: 11.2 10*3/uL — ABNORMAL HIGH (ref 3.6–11.0)

## 2016-04-07 LAB — POCT PREGNANCY, URINE: Preg Test, Ur: NEGATIVE

## 2016-04-07 MED ORDER — BARIUM SULFATE 2.1 % PO SUSP
450.0000 mL | ORAL | Status: AC
  Administered 2016-04-07: 450 mL via ORAL

## 2016-04-07 MED ORDER — ONDANSETRON HCL 4 MG/2ML IJ SOLN
INTRAMUSCULAR | Status: AC
  Filled 2016-04-07: qty 2

## 2016-04-07 MED ORDER — MORPHINE SULFATE (PF) 4 MG/ML IV SOLN
4.0000 mg | Freq: Once | INTRAVENOUS | Status: AC
  Administered 2016-04-07: 4 mg via INTRAVENOUS

## 2016-04-07 MED ORDER — MORPHINE SULFATE (PF) 4 MG/ML IV SOLN
INTRAVENOUS | Status: AC
  Administered 2016-04-07: 4 mg via INTRAVENOUS
  Filled 2016-04-07: qty 1

## 2016-04-07 MED ORDER — ONDANSETRON HCL 4 MG/2ML IJ SOLN
4.0000 mg | Freq: Once | INTRAMUSCULAR | Status: AC
  Administered 2016-04-07: 4 mg via INTRAVENOUS

## 2016-04-07 MED ORDER — MORPHINE SULFATE (PF) 4 MG/ML IV SOLN
INTRAVENOUS | Status: AC
  Filled 2016-04-07: qty 1

## 2016-04-07 NOTE — ED Notes (Signed)
AAOx3.  Skin warm and dry.  Drinking contrast.  Denies N/V.  Continue to monitor.

## 2016-04-07 NOTE — ED Notes (Signed)
Dr. Owens Shark notified of paitent's return of pain.   Morphine ordered and administered per MAR.  Continue to monitor.

## 2016-04-07 NOTE — ED Triage Notes (Signed)
Pt in with co epigastric pain acute onset at 0500 this am, had duodenal switch 2 months ago, and husband states "there is an umbilical hernia that was not there before".

## 2016-04-07 NOTE — ED Provider Notes (Signed)
-----------------------------------------   11:08 AM on 04/07/2016 -----------------------------------------  Patient care assumed from Dr. Owens Shark. CT scan largely normal. Patient states upon arrival she had an umbilical hernia however it reduced on its own and that's when the pain relieved. I discussed with the patient this could very likely be the cause of her discomfort. She will follow-up with her surgeon. I discussed return precautions. The patient is agreeable.   Harvest Dark, MD 04/07/16 1109

## 2016-04-07 NOTE — ED Provider Notes (Signed)
[facility] Emergency Department Provider Note  ____________________________________________   First MD Initiated Contact with Patient 04/07/16 [phone]     (approximate)  I have reviewed the triage vital signs and the nursing notes.   HISTORY  Chief Complaint Abdominal Pain   HPI Kathryn Coffey is a 45 y.o. female history of gastric bypass surgery presents with acute onset of generalized abdominal pain this morning that is currently 10 out of 10 associated with nausea and vomiting. Patient denies any fever no constipation. Patient denies any urinary symptoms.   Past Medical History:  Diagnosis Date  . Abdominal wall cellulitis 2013 and 2014  . Asthma    allergic to grasses  . B12 deficiency   . Costochondritis   . Gastric anomaly    multiple small ulcers  . GERD (gastroesophageal reflux disease)   . History of abnormal cervical Pap smear   . History of Clostridium difficile colitis   . History of hiatal hernia   . Hypertension   . IBS (irritable bowel syndrome)   . Migraine   . Morbid obesity ([place])    s/p attempted gastric banding now decompressed  . PCOS (polycystic ovarian syndrome)   . Sleep apnea    mild no cpap needed    Patient Active Problem List   Diagnosis Date Noted  . Morbid obesity due to excess calories ([place]) 01/25/2016    Past Surgical History:  Procedure Laterality Date  . CESAREAN SECTION    . CHOLECYSTECTOMY    . COLONOSCOPY    . ESOPHAGOGASTRODUODENOSCOPY    . ESOPHAGOGASTRODUODENOSCOPY (EGD) WITH PROPOFOL N/A 12/06/2015   Procedure: ESOPHAGOGASTRODUODENOSCOPY (EGD) WITH PROPOFOL;  Surgeon: Manya Silvas, MD;  Location: [facility] ENDOSCOPY;  Service: Endoscopy;  Laterality: N/A;  . LAPAROSCOPIC GASTRIC BANDING    . LAPAROSCOPIC GASTRIC RESTRICTIVE DUODENAL PROCEDURE (DUODENAL SWITCH) N/A 01/25/2016   Procedure: LAPAROSCOPIC GASTRIC RESTRICTIVE DUODENAL PROCEDURE (DUODENAL SWITCH);  Surgeon: Ladora Daniel, MD;  Location:  [facility] ORS;  Service: General;  Laterality: N/A;  . OVARY SURGERY     x2  . TONSILLECTOMY    . TUBAL LIGATION    . UMBILICAL HERNIA REPAIR N/A 01/25/2016   Procedure: LAPAROSCOPIC UMBILICAL HERNIA;  Surgeon: Ladora Daniel, MD;  Location: [facility] ORS;  Service: General;  Laterality: N/A;    Prior to Admission medications   Medication Sig Start Date End Date Taking? Authorizing Provider  albuterol (PROVENTIL HFA;VENTOLIN HFA) 108 (90 Base) MCG/ACT inhaler Inhale 2 puffs into the lungs every 6 (six) hours as needed for wheezing or shortness of breath.    Historical Provider, MD  b complex vitamins tablet Take 1 tablet by mouth daily.    Historical Provider, MD  fexofenadine (ALLEGRA) 180 MG tablet Take 180 mg by mouth.    Historical Provider, MD  fluticasone (FLONASE) 50 MCG/ACT nasal spray Place 2 sprays into both nostrils daily.    Historical Provider, MD  Liraglutide (VICTOZA) 18 MG/3ML SOPN Inject 0.6 mg into the skin.  04/07/15   Historical Provider, MD  losartan-hydrochlorothiazide (HYZAAR) 100-25 MG tablet Take by mouth.    Historical Provider, MD  traMADol (ULTRAM) 50 MG tablet Take by mouth. 09/03/15   Historical Provider, MD    Allergies Azithromycin; Ivp dye [iodinated diagnostic agents]; Lexapro [escitalopram]; and Vicodin [hydrocodone-acetaminophen]  No family history on file.  Social History Social History  Substance Use Topics  . Smoking status: Never Smoker  . Smokeless tobacco: Never Used  . Alcohol use No  Review of Systems Constitutional: No fever/chills Eyes: No visual changes. ENT: No sore throat. Cardiovascular: Denies chest pain. Respiratory: Denies shortness of breath. Gastrointestinal:Positive for abdominal pain and vomiting  Genitourinary: Negative for dysuria. Musculoskeletal: Negative for back pain. Skin: Negative for rash. Neurological: Negative for headaches, focal weakness or numbness.  10-point ROS otherwise  negative.  ____________________________________________   PHYSICAL EXAM:  VITAL SIGNS: ED Triage Vitals  Enc Vitals Group     BP 04/07/16 0609 (!) 57/38     Pulse Rate 04/07/16 0609 (!) 57     Resp 04/07/16 0609 18     Temp 04/07/16 0609 97.5 F (36.4 C)     Temp Source 04/07/16 0609 Oral     SpO2 04/07/16 0609 100 %     Weight 04/07/16 0610 275 lb (124.7 kg)     Height 04/07/16 0610 5\' 5"  (1.651 m)     Head Circumference --      Peak Flow --      Pain Score 04/07/16 0610 10     Pain Loc --      Pain Edu? --      Excl. in [place]? --     Constitutional: Alert and oriented. Apparent discomfort. Eyes: Conjunctivae are normal. PERRL. EOMI. Head: Atraumatic. Nose: No congestion/rhinnorhea. Mouth/Throat: Mucous membranes are moist.  Oropharynx non-erythematous. Neck: No stridor.  No meningeal signs.  Cardiovascular: Normal rate, regular rhythm. Good peripheral circulation. Grossly normal heart sounds.   Respiratory: Normal respiratory effort.  No retractions. Lungs CTAB. Gastrointestinal: Generalized abdominal tenderness to palpation. No distention.  Musculoskeletal: No lower extremity tenderness nor edema. No gross deformities of extremities. Neurologic:  Normal speech and language. No gross focal neurologic deficits are appreciated.  Skin:  Skin is warm, dry and intact. No rash noted. Psychiatric: Mood and affect are normal. Speech and behavior are normal.  ____________________________________________   LABS (all labs ordered are listed, but only abnormal results are displayed)  Labs Reviewed  CBC - Abnormal; Notable for the following:       Result Value   WBC 11.2 (*)    All other components within normal limits  COMPREHENSIVE METABOLIC PANEL - Abnormal; Notable for the following:    Potassium 2.9 (*)    Glucose, Bld 156 (*)    AST 62 (*)    ALT 62 (*)    All other components within normal limits       Procedures    INITIAL IMPRESSION / ASSESSMENT AND PLAN /  ED COURSE  Pertinent labs & imaging results that were available during my care of the patient were reviewed by me and considered in my medical decision making (see chart for details).  Given history and physical exam concern for intra-abdominal pathology including possible bowel obstruction as such CT scan of the abdomen ordered. Patient's care transferred to Dr. Kerman Passey  Clinical Course    ____________________________________________  FINAL CLINICAL IMPRESSION(S) / ED DIAGNOSES  Final diagnoses:  Abdominal pain, unspecified abdominal location     MEDICATIONS GIVEN DURING THIS VISIT:  Medications  morphine 4 MG/ML injection 4 mg (4 mg Intravenous Given 04/07/16 0633)  ondansetron (ZOFRAN) injection 4 mg (4 mg Intravenous Given 04/07/16 0633)  morphine 4 MG/ML injection 4 mg (4 mg Intravenous Given 04/07/16 0740)     NEW OUTPATIENT MEDICATIONS STARTED DURING THIS VISIT:  New Prescriptions   No medications on file      Note:  This document was prepared using Dragon voice recognition software and may include unintentional  dictation errors.    Gregor Hams, MD 04/07/16 [phone]

## 2016-04-07 NOTE — ED Notes (Signed)
Pt stating that she woke up all sweaty this morning and in a lot of pain. Pt had bariatric surgery on 01/25/16. Pt stating no complications. Pt has had nausea with no vomiting. Pt also noticed that her umbilical hernia was "popping out more." "like a gulf ball."

## 2016-04-10 ENCOUNTER — Other Ambulatory Visit: Payer: Self-pay | Admitting: Internal Medicine

## 2016-04-10 DIAGNOSIS — Z1231 Encounter for screening mammogram for malignant neoplasm of breast: Secondary | ICD-10-CM

## 2016-04-18 ENCOUNTER — Ambulatory Visit
Admission: RE | Admit: 2016-04-18 | Discharge: 2016-04-18 | Disposition: A | Discharge status: Home / Self Care | Payer: 59 | Source: Ambulatory Visit | Attending: Internal Medicine | Admitting: Internal Medicine

## 2016-04-18 ENCOUNTER — Other Ambulatory Visit: Payer: Self-pay | Admitting: Internal Medicine

## 2016-04-18 DIAGNOSIS — Z1231 Encounter for screening mammogram for malignant neoplasm of breast: Secondary | ICD-10-CM

## 2016-04-19 DIAGNOSIS — S8391XD Sprain of unspecified site of right knee, subsequent encounter: Secondary | ICD-10-CM | POA: Diagnosis not present

## 2016-04-19 DIAGNOSIS — S8392XD Sprain of unspecified site of left knee, subsequent encounter: Secondary | ICD-10-CM | POA: Diagnosis not present

## 2016-05-01 DIAGNOSIS — Z01419 Encounter for gynecological examination (general) (routine) without abnormal findings: Secondary | ICD-10-CM | POA: Diagnosis not present

## 2016-05-17 DIAGNOSIS — E538 Deficiency of other specified B group vitamins: Secondary | ICD-10-CM | POA: Diagnosis not present

## 2016-05-17 DIAGNOSIS — I10 Essential (primary) hypertension: Secondary | ICD-10-CM | POA: Diagnosis not present

## 2016-05-29 DIAGNOSIS — K429 Umbilical hernia without obstruction or gangrene: Secondary | ICD-10-CM | POA: Diagnosis not present

## 2016-05-29 DIAGNOSIS — R739 Hyperglycemia, unspecified: Secondary | ICD-10-CM | POA: Diagnosis not present

## 2016-05-29 DIAGNOSIS — Z0000 Encounter for general adult medical examination without abnormal findings: Secondary | ICD-10-CM | POA: Diagnosis not present

## 2016-07-26 DIAGNOSIS — Z9884 Bariatric surgery status: Secondary | ICD-10-CM | POA: Diagnosis not present

## 2016-07-26 DIAGNOSIS — Z5181 Encounter for therapeutic drug level monitoring: Secondary | ICD-10-CM | POA: Diagnosis not present

## 2016-07-26 DIAGNOSIS — R638 Other symptoms and signs concerning food and fluid intake: Secondary | ICD-10-CM | POA: Diagnosis not present

## 2016-07-26 DIAGNOSIS — K912 Postsurgical malabsorption, not elsewhere classified: Secondary | ICD-10-CM | POA: Diagnosis not present

## 2016-10-05 DIAGNOSIS — K429 Umbilical hernia without obstruction or gangrene: Secondary | ICD-10-CM | POA: Diagnosis not present

## 2016-10-13 NOTE — Patient Instructions (Signed)
  Your procedure is scheduled on: 10-20-16 (FRIDAY) Report to Same Day Surgery 2nd floor medical mall [facility] Entrance-take elevator on left to 2nd floor.  Check in with surgery information desk.) To find out your arrival time please call [phone] between 1PM - 3PM on 10-19-16 [facility])  Remember: Instructions that are not followed completely may result in serious medical risk, up to and including death, or upon the discretion of your surgeon and anesthesiologist your surgery may need to be rescheduled.    _x___ 1. Do not eat food or drink liquids after midnight. No gum chewing or hard candies.     __x__ 2. No Alcohol for 24 hours before or after surgery.   __x__3. No Smoking for 24 prior to surgery.   ____  4. Bring all medications with you on the day of surgery if instructed.    __x__ 5. Notify your doctor if there is any change in your medical condition     (cold, fever, infections).     Do not wear jewelry, make-up, hairpins, clips or nail polish.  Do not wear lotions, powders, or perfumes. You may wear deodorant.  Do not shave 48 hours prior to surgery. Men may shave face and neck.  Do not bring valuables to the hospital.    [facility] is not responsible for any belongings or valuables.               Contacts, dentures or bridgework may not be worn into surgery.  Leave your suitcase in the car. After surgery it may be brought to your room.  For patients admitted to the hospital, discharge time is determined by your treatment team.   Patients discharged the day of surgery will not be allowed to drive home.  You will need someone to drive you home and stay with you the night of your procedure.    Please read over the following fact sheets that you were given:   [facility] Preparing for Surgery and or MRSA Information   ____ Take these medicines the morning of surgery with A SIP OF WATER:    1. NONE  2.  3.  4.  5.  6.  ____Fleets enema or Magnesium Citrate as  directed.   _x___ Use CHG Soap or sage wipes as directed on instruction sheet   _X___ Use inhalers on the day of surgery and bring to hospital day of surgery-USE ALBUTEROL INHALER AT [place]  ____ Stop metformin 2 days prior to surgery    ____ Take 1/2 of usual insulin dose the night before surgery and none on the morning of  surgery.   ____ Stop Aspirin, Coumadin, Pllavix ,Eliquis, Effient, or Pradaxa  x__ Stop Anti-inflammatories such as Advil, Aleve, Ibuprofen, Motrin, Naproxen,          Naprosyn, Goodies powders or aspirin products NOW-Ok to take Tylenol OR TRAMADOL IF NEEDED   ____ Stop supplements until after surgery.    ____ Bring C-Pap to the hospital.

## 2016-10-16 ENCOUNTER — Encounter
Admission: RE | Admit: 2016-10-16 | Discharge: 2016-10-16 | Disposition: A | Discharge status: Home / Self Care | Payer: 59 | Source: Ambulatory Visit | Attending: Surgery | Admitting: Surgery

## 2016-10-16 DIAGNOSIS — I6601 Occlusion and stenosis of right middle cerebral artery: Secondary | ICD-10-CM | POA: Insufficient documentation

## 2016-10-16 DIAGNOSIS — Q459 Congenital malformation of digestive system, unspecified: Secondary | ICD-10-CM | POA: Diagnosis not present

## 2016-10-16 DIAGNOSIS — Z01812 Encounter for preprocedural laboratory examination: Secondary | ICD-10-CM | POA: Insufficient documentation

## 2016-10-16 DIAGNOSIS — I10 Essential (primary) hypertension: Secondary | ICD-10-CM | POA: Diagnosis not present

## 2016-10-16 DIAGNOSIS — Z01810 Encounter for preprocedural cardiovascular examination: Secondary | ICD-10-CM | POA: Diagnosis not present

## 2016-10-16 DIAGNOSIS — K449 Diaphragmatic hernia without obstruction or gangrene: Secondary | ICD-10-CM | POA: Insufficient documentation

## 2016-10-16 DIAGNOSIS — G4730 Sleep apnea, unspecified: Secondary | ICD-10-CM | POA: Diagnosis not present

## 2016-10-16 DIAGNOSIS — J45909 Unspecified asthma, uncomplicated: Secondary | ICD-10-CM | POA: Diagnosis not present

## 2016-10-16 LAB — POTASSIUM: Potassium: 3.2 mmol/L — ABNORMAL LOW (ref 3.5–5.1)

## 2016-10-16 NOTE — Pre-Procedure Instructions (Signed)
FAXED OVER 3.2 K+ RESULT TO DR SMITHS OFFICE WITH FAX CONFIRMATIONS RECEIVED AND SPOKE WITH SHERRY AT [place] INFORMING HER OF K+.  SHERRY SPOKE WITH DR Tamala Julian AND HE HAS ORDERED K+ SUPPLEMENT FOR PT. SHERRY TO CALL PT AND INFORM HER OF THIS AND THAT SUPPLEMENT HAS BEEN ORDERED

## 2016-10-19 ENCOUNTER — Encounter: Payer: Self-pay | Admitting: *Deleted

## 2016-10-20 ENCOUNTER — Encounter
Admission: RE | Disposition: A | Discharge status: Home / Self Care | Payer: Self-pay | Source: Ambulatory Visit | Attending: Surgery

## 2016-10-20 ENCOUNTER — Encounter: Payer: Self-pay | Admitting: *Deleted

## 2016-10-20 ENCOUNTER — Ambulatory Visit: Payer: 59 | Admitting: Anesthesiology

## 2016-10-20 ENCOUNTER — Ambulatory Visit
Admission: RE | Admit: 2016-10-20 | Discharge: 2016-10-20 | Disposition: A | Discharge status: Home / Self Care | Payer: 59 | Source: Ambulatory Visit | Attending: Surgery | Admitting: Surgery

## 2016-10-20 DIAGNOSIS — K449 Diaphragmatic hernia without obstruction or gangrene: Secondary | ICD-10-CM | POA: Insufficient documentation

## 2016-10-20 DIAGNOSIS — I10 Essential (primary) hypertension: Secondary | ICD-10-CM | POA: Diagnosis not present

## 2016-10-20 DIAGNOSIS — Z955 Presence of coronary angioplasty implant and graft: Secondary | ICD-10-CM | POA: Diagnosis not present

## 2016-10-20 DIAGNOSIS — K589 Irritable bowel syndrome without diarrhea: Secondary | ICD-10-CM | POA: Insufficient documentation

## 2016-10-20 DIAGNOSIS — E538 Deficiency of other specified B group vitamins: Secondary | ICD-10-CM | POA: Insufficient documentation

## 2016-10-20 DIAGNOSIS — K219 Gastro-esophageal reflux disease without esophagitis: Secondary | ICD-10-CM | POA: Insufficient documentation

## 2016-10-20 DIAGNOSIS — K429 Umbilical hernia without obstruction or gangrene: Secondary | ICD-10-CM | POA: Insufficient documentation

## 2016-10-20 DIAGNOSIS — Z6841 Body Mass Index (BMI) 40.0 and over, adult: Secondary | ICD-10-CM | POA: Insufficient documentation

## 2016-10-20 DIAGNOSIS — J45909 Unspecified asthma, uncomplicated: Secondary | ICD-10-CM | POA: Diagnosis not present

## 2016-10-20 HISTORY — PX: UMBILICAL HERNIA REPAIR: SHX196

## 2016-10-20 LAB — POCT I-STAT 4, (NA,K, GLUC, HGB,HCT)
Glucose, Bld: 119 mg/dL — ABNORMAL HIGH (ref 65–99)
HCT: 40 % (ref 36.0–46.0)
Hemoglobin: 13.6 g/dL (ref 12.0–15.0)
Potassium: 3.9 mmol/L (ref 3.5–5.1)
Sodium: 139 mmol/L (ref 135–145)

## 2016-10-20 LAB — POCT PREGNANCY, URINE: Preg Test, Ur: NEGATIVE

## 2016-10-20 SURGERY — REPAIR, HERNIA, UMBILICAL, ADULT
Anesthesia: General | Wound class: Clean

## 2016-10-20 MED ORDER — IPRATROPIUM-ALBUTEROL 0.5-2.5 (3) MG/3ML IN SOLN
RESPIRATORY_TRACT | Status: AC
  Administered 2016-10-20: 3 mL
  Filled 2016-10-20: qty 3

## 2016-10-20 MED ORDER — FENTANYL CITRATE (PF) 100 MCG/2ML IJ SOLN
INTRAMUSCULAR | Status: AC
  Administered 2016-10-20: 25 ug via INTRAVENOUS
  Filled 2016-10-20: qty 2

## 2016-10-20 MED ORDER — ROCURONIUM BROMIDE 50 MG/5ML IV SOLN
INTRAVENOUS | Status: AC
  Filled 2016-10-20: qty 1

## 2016-10-20 MED ORDER — FENTANYL CITRATE (PF) 100 MCG/2ML IJ SOLN
25.0000 ug | INTRAMUSCULAR | Status: DC | PRN
  Administered 2016-10-20 (×2): 25 ug via INTRAVENOUS

## 2016-10-20 MED ORDER — ROCURONIUM BROMIDE 100 MG/10ML IV SOLN
INTRAVENOUS | Status: DC | PRN
  Administered 2016-10-20: 30 mg via INTRAVENOUS
  Administered 2016-10-20 (×2): 10 mg via INTRAVENOUS

## 2016-10-20 MED ORDER — FENTANYL CITRATE (PF) 100 MCG/2ML IJ SOLN
INTRAMUSCULAR | Status: AC
  Filled 2016-10-20: qty 2

## 2016-10-20 MED ORDER — PROMETHAZINE HCL 25 MG/ML IJ SOLN
INTRAMUSCULAR | Status: AC
  Administered 2016-10-20: 6.25 mg via INTRAVENOUS
  Filled 2016-10-20: qty 1

## 2016-10-20 MED ORDER — KETOROLAC TROMETHAMINE 30 MG/ML IJ SOLN
30.0000 mg | Freq: Once | INTRAMUSCULAR | Status: AC
  Administered 2016-10-20: 30 mg via INTRAVENOUS

## 2016-10-20 MED ORDER — MIDAZOLAM HCL 2 MG/2ML IJ SOLN
INTRAMUSCULAR | Status: AC
  Filled 2016-10-20: qty 2

## 2016-10-20 MED ORDER — BUPIVACAINE-EPINEPHRINE (PF) 0.5% -1:200000 IJ SOLN
INTRAMUSCULAR | Status: DC | PRN
  Administered 2016-10-20: 10 mL

## 2016-10-20 MED ORDER — SUGAMMADEX SODIUM 200 MG/2ML IV SOLN
INTRAVENOUS | Status: DC | PRN
  Administered 2016-10-20: 200 mg via INTRAVENOUS

## 2016-10-20 MED ORDER — HYDROCODONE-ACETAMINOPHEN 5-325 MG PO TABS: 1.0000 | ORAL_TABLET | ORAL | 0 refills | Status: DC | PRN

## 2016-10-20 MED ORDER — IPRATROPIUM-ALBUTEROL 0.5-2.5 (3) MG/3ML IN SOLN: 3.0000 mL | RESPIRATORY_TRACT | Status: DC

## 2016-10-20 MED ORDER — PROMETHAZINE HCL 25 MG/ML IJ SOLN
6.2500 mg | INTRAMUSCULAR | Status: DC | PRN
  Administered 2016-10-20: 6.25 mg via INTRAVENOUS

## 2016-10-20 MED ORDER — SODIUM CHLORIDE 0.9 % IJ SOLN
INTRAMUSCULAR | Status: AC
  Filled 2016-10-20: qty 10

## 2016-10-20 MED ORDER — PROPOFOL 10 MG/ML IV BOLUS
INTRAVENOUS | Status: DC | PRN
  Administered 2016-10-20: 200 mg via INTRAVENOUS
  Administered 2016-10-20: 100 mg via INTRAVENOUS

## 2016-10-20 MED ORDER — DEXAMETHASONE SODIUM PHOSPHATE 10 MG/ML IJ SOLN
INTRAMUSCULAR | Status: DC | PRN
  Administered 2016-10-20: 10 mg via INTRAVENOUS

## 2016-10-20 MED ORDER — MIDAZOLAM HCL 2 MG/2ML IJ SOLN
INTRAMUSCULAR | Status: DC | PRN
  Administered 2016-10-20: 2 mg via INTRAVENOUS

## 2016-10-20 MED ORDER — SUGAMMADEX SODIUM 200 MG/2ML IV SOLN
INTRAVENOUS | Status: AC
  Filled 2016-10-20: qty 2

## 2016-10-20 MED ORDER — PROPOFOL 10 MG/ML IV BOLUS
INTRAVENOUS | Status: AC
  Filled 2016-10-20: qty 20

## 2016-10-20 MED ORDER — BUPIVACAINE-EPINEPHRINE (PF) 0.5% -1:200000 IJ SOLN
INTRAMUSCULAR | Status: AC
  Filled 2016-10-20: qty 30

## 2016-10-20 MED ORDER — LACTATED RINGERS IV SOLN
INTRAVENOUS | Status: DC
  Administered 2016-10-20: 14:00:00 via INTRAVENOUS

## 2016-10-20 MED ORDER — KETOROLAC TROMETHAMINE 30 MG/ML IJ SOLN
INTRAMUSCULAR | Status: AC
  Filled 2016-10-20: qty 1

## 2016-10-20 MED ORDER — DEXAMETHASONE SODIUM PHOSPHATE 10 MG/ML IJ SOLN
INTRAMUSCULAR | Status: AC
  Filled 2016-10-20: qty 1

## 2016-10-20 MED ORDER — CEFAZOLIN SODIUM 10 G IJ SOLR
3.0000 g | Freq: Once | INTRAMUSCULAR | Status: AC
  Administered 2016-10-20: 3 g via INTRAVENOUS
  Filled 2016-10-20: qty 3000

## 2016-10-20 MED ORDER — FENTANYL CITRATE (PF) 100 MCG/2ML IJ SOLN
INTRAMUSCULAR | Status: DC | PRN
  Administered 2016-10-20 (×3): 50 ug via INTRAVENOUS
  Administered 2016-10-20: 100 ug via INTRAVENOUS

## 2016-10-20 MED ORDER — ONDANSETRON HCL 4 MG/2ML IJ SOLN
INTRAMUSCULAR | Status: AC
  Filled 2016-10-20: qty 2

## 2016-10-20 MED ORDER — HYDROCODONE-ACETAMINOPHEN 5-325 MG PO TABS: 1.0000 | ORAL_TABLET | ORAL | Status: DC | PRN

## 2016-10-20 MED ORDER — SUCCINYLCHOLINE CHLORIDE 20 MG/ML IJ SOLN
INTRAMUSCULAR | Status: DC | PRN
  Administered 2016-10-20: 140 mg via INTRAVENOUS

## 2016-10-20 MED ORDER — SUCCINYLCHOLINE CHLORIDE 20 MG/ML IJ SOLN
INTRAMUSCULAR | Status: AC
  Filled 2016-10-20: qty 1

## 2016-10-20 SURGICAL SUPPLY — 26 items
BLADE SURG 15 STRL LF DISP TIS (BLADE) ×1 IMPLANT
BLADE SURG 15 STRL SS (BLADE) ×1
CANISTER SUCT 1200ML W/VALVE (MISCELLANEOUS) ×2 IMPLANT
CHLORAPREP W/TINT 26ML (MISCELLANEOUS) ×2 IMPLANT
DERMABOND ADVANCED (GAUZE/BANDAGES/DRESSINGS) ×1
DERMABOND ADVANCED .7 DNX12 (GAUZE/BANDAGES/DRESSINGS) ×1 IMPLANT
DRAPE LAPAROTOMY 77X122 PED (DRAPES) ×2 IMPLANT
ELECT REM PT RETURN 9FT ADLT (ELECTROSURGICAL) ×2
ELECTRODE REM PT RTRN 9FT ADLT (ELECTROSURGICAL) ×1 IMPLANT
GLOVE BIO SURGEON STRL SZ7.5 (GLOVE) ×2 IMPLANT
GOWN STRL REUS W/ TWL LRG LVL3 (GOWN DISPOSABLE) ×3 IMPLANT
GOWN STRL REUS W/TWL LRG LVL3 (GOWN DISPOSABLE) ×3
KIT RM TURNOVER STRD PROC AR (KITS) ×2 IMPLANT
LABEL OR SOLS (LABEL) ×2 IMPLANT
MESH SYNTHETIC 4X6 SOFT BARD (Mesh General) ×1 IMPLANT
MESH SYNTHETIC SOFT BARD 4X6 (Mesh General) ×1 IMPLANT
NEEDLE HYPO 25X1 1.5 SAFETY (NEEDLE) ×2 IMPLANT
NS IRRIG 500ML POUR BTL (IV SOLUTION) ×2 IMPLANT
PACK BASIN MINOR ARMC (MISCELLANEOUS) ×2 IMPLANT
SUT CHROMIC 0 SH (SUTURE) ×2 IMPLANT
SUT CHROMIC 3 0 SH 27 (SUTURE) ×2 IMPLANT
SUT CHROMIC 4 0 RB 1X27 (SUTURE) ×2 IMPLANT
SUT MNCRL+ 5-0 UNDYED PC-3 (SUTURE) ×1 IMPLANT
SUT MONOCRYL 5-0 (SUTURE) ×1
SUT SURGILON 0 30 BLK (SUTURE) ×4 IMPLANT
SYRINGE 10CC LL (SYRINGE) ×2 IMPLANT

## 2016-10-20 NOTE — H&P (Signed)
  She comes in prepared for umbilical hernia repair.  Her recent serum potassium was low and was started on a course of potassium supplement. Serum potassium today is normal.  She reports no other acute illness. Does have mild headache at present which she attributes to not drinking coffee this morning  I discussed the plan for umbilical hernia repair

## 2016-10-20 NOTE — Anesthesia Procedure Notes (Addendum)
Procedure Name: Intubation Date/Time: 10/20/2016 3:00 PM Performed by: Jonna Clark Pre-anesthesia Checklist: Patient identified, Patient being monitored, Timeout performed, Emergency Drugs available and Suction available Patient Re-evaluated:Patient Re-evaluated prior to inductionOxygen Delivery Method: Circle system utilized Preoxygenation: Pre-oxygenation with 100% oxygen Intubation Type: IV induction and Rapid sequence Ventilation: Mask ventilation without difficulty Laryngoscope Size: Mac and 3 Grade View: Grade I Tube type: Oral Tube size: 7.0 mm Number of attempts: 1 Airway Equipment and Method: Stylet Placement Confirmation: ETT inserted through vocal cords under direct vision,  positive ETCO2 and breath sounds checked- equal and bilateral Secured at: 21 cm Tube secured with: Tape Dental Injury: Teeth and Oropharynx as per pre-operative assessment

## 2016-10-20 NOTE — Discharge Instructions (Addendum)
AMBULATORY SURGERY  DISCHARGE INSTRUCTIONS   1) The drugs that you were given will stay in your system until tomorrow so for the next 24 hours you should not:  A) Drive an automobile B) Make any legal decisions C) Drink any alcoholic beverage   2) You may resume regular meals tomorrow.  Today it is better to start with liquids and gradually work up to solid foods.  You may eat anything you prefer, but it is better to start with liquids, then soup and crackers, and gradually work up to solid foods.   3) Please notify your doctor immediately if you have any unusual bleeding, trouble breathing, redness and pain at the surgery site, drainage, fever, or pain not relieved by medication.    4) Additional Instructions:        Please contact your physician with any problems or Same Day Surgery at [phone], Monday through Friday 6 am to 4 pm, or [place] at [facility] number at [phone].Take Tylenol or Norco if needed for pain.  Decrease potassium to take 1 per day.  Should not drive or do anything dangerous when taking Norco.  May shower and blot dry.  Avoid straining and heavy lifting.   AMBULATORY SURGERY  DISCHARGE INSTRUCTIONS   5) The drugs that you were given will stay in your system until tomorrow so for the next 24 hours you should not:  D) Drive an automobile E) Make any legal decisions F) Drink any alcoholic beverage   6) You may resume regular meals tomorrow.  Today it is better to start with liquids and gradually work up to solid foods.  You may eat anything you prefer, but it is better to start with liquids, then soup and crackers, and gradually work up to solid foods.   7) Please notify your doctor immediately if you have any unusual bleeding, trouble breathing, redness and pain at the surgery site, drainage, fever, or pain not relieved by medication.    8) Additional Instructions:        Please contact your physician with any  problems or Same Day Surgery at [phone], Monday through Friday 6 am to 4 pm, or [place] at [facility] number at [phone].

## 2016-10-20 NOTE — Anesthesia Post-op Follow-up Note (Cosign Needed)
Anesthesia QCDR form completed.        

## 2016-10-20 NOTE — Anesthesia Preprocedure Evaluation (Signed)
Anesthesia Evaluation  Patient identified by MRN, date of birth, ID band Patient awake    Reviewed: Allergy & Precautions, H&P , NPO status , Patient's Chart, lab work & pertinent test results, reviewed documented beta blocker date and time   History of Anesthesia Complications Negative for: history of anesthetic complications  Airway Mallampati: III  TM Distance: >3 FB Neck ROM: full    Dental  (+) Caps   Pulmonary neg shortness of breath, asthma , neg sleep apnea, neg COPD, neg recent URI,           Cardiovascular Exercise Tolerance: Good hypertension, (-) angina(-) CAD, (-) Past MI, (-) Cardiac Stents and (-) CABG (-) dysrhythmias (-) Valvular Problems/Murmurs     Neuro/Psych negative neurological ROS  negative psych ROS   GI/Hepatic Neg liver ROS, hiatal hernia, GERD  ,  Endo/Other  neg diabetesMorbid obesity  Renal/GU negative Renal ROS  negative genitourinary   Musculoskeletal   Abdominal   Peds  Hematology negative hematology ROS (+)   Anesthesia Other Findings Past Medical History: 2013 and 2014: Abdominal wall cellulitis No date: Asthma     Comment: allergic to grasses No date: B12 deficiency No date: Costochondritis 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 No date: GERD (gastroesophageal reflux disease) No date: History of abnormal cervical Pap smear No date: History of Clostridium difficile colitis No date: History of hiatal hernia No date: Hypertension No date: IBS (irritable bowel syndrome) No date: Migraine No date: Morbid obesity ([place])     Comment: s/p attempted gastric banding now decompressed No date: PCOS (polycystic ovarian syndrome)   Reproductive/Obstetrics negative OB ROS                             Anesthesia Physical Anesthesia Plan  ASA: III  Anesthesia Plan: General   Post-op Pain Management:    Induction:   Airway Management  Planned:   Additional Equipment:   Intra-op Plan:   Post-operative Plan:   Informed Consent: I have reviewed the patients History and Physical, chart, labs and discussed the procedure including the risks, benefits and alternatives for the proposed anesthesia with the patient or authorized representative who has indicated his/her understanding and acceptance.   Dental Advisory Given  Plan Discussed with: Anesthesiologist, CRNA and Surgeon  Anesthesia Plan Comments:         Anesthesia Quick Evaluation

## 2016-10-20 NOTE — Transfer of Care (Signed)
Immediate Anesthesia Transfer of Care Note  Patient: Kathryn Coffey  Procedure(s) Performed: Procedure(s): HERNIA REPAIR UMBILICAL ADULT (N/A)  Patient Location: PACU  Anesthesia Type:General  Level of Consciousness: sedated and responds to stimulation  Airway & Oxygen Therapy: Patient Spontanous Breathing and Patient connected to face mask oxygen  Post-op Assessment: Report given to RN and Post -op Vital signs reviewed and stable  Post vital signs: Reviewed and stable  Last Vitals:  Vitals:   10/20/16 1336 10/20/16 1706  BP: (!) 122/98 130/76  Pulse: 77 97  Resp: 16 13  Temp: 36.7 C 37.5 C    Last Pain:  Vitals:   10/20/16 1336  TempSrc: Oral         Complications: No apparent anesthesia complications

## 2016-10-20 NOTE — Op Note (Signed)
OPERATIVE REPORT  PREOPERATIVE  DIAGNOSIS: . Umbilical hernia  POSTOPERATIVE DIAGNOSIS: . Umbilical hernia  PROCEDURE: . Umbilical hernia repair  ANESTHESIA:  General  SURGEON: Rochel Brome  MD   INDICATIONS: . She has a history of bulging and pain at the umbilicus. An umbilical hernia was demonstrated on physical exam. Repair is recommended for definitive treatment.  With the patient on the operating table in the supine position under general anesthesia the abdomen was prepared with ChloraPrep and draped in a sterile manner  A transversely oriented suprapubic umbilical incision was made some 5 cm in length and carried down through subcutaneous tissues. The patient is morbidly obese and did have to dissected fairly deeply down to reach the fascia. There was an umbilical hernia sac which was dissected free from surrounding tissues and was transected just below the skin of the umbilicus. The sac was dissected free from the fascial ring defect. The sac contained no bowel. The sac was suture ligated with 0 chromic and amputated. It was not submitted for pathology. Properitoneal fat was dissected away from the fascial extending back approximately 1.5 cm circumferential to the fascial ring defect. Bard soft mesh was cut to create a 3 x 4 cm oval mesh which was placed into the properitoneal plane oriented transversely and was sutured to the overlying fascia with through and through 0 Surgilon sutures. The fascial ring defect was then closed with a transversely oriented suture line of interrupted 0 Surgilon figure-of-eight sutures incorporating each suture into the mesh. The deep fascia was infiltrated with half percent Sensorcaine with epinephrine and also the subcutaneous tissues on the upper skin edges were infiltrated. The skin of the umbilicus was sutured to the deep fascia with 3-0 chromic. The skin was closed with running 4-0 Monocryl subcuticular suture and Dermabond.  The patient appeared to  tolerate the procedure satisfactorily and was then prepared for transfer to the recovery room  [facility].D.

## 2016-10-21 NOTE — Anesthesia Postprocedure Evaluation (Signed)
Anesthesia Post Note  Patient: Kathryn Coffey  Procedure(s) Performed: Procedure(s) (LRB): HERNIA REPAIR UMBILICAL ADULT (N/A)  Patient location during evaluation: PACU Anesthesia Type: General Level of consciousness: awake and alert and oriented Pain management: pain level controlled Vital Signs Assessment: post-procedure vital signs reviewed and stable Respiratory status: spontaneous breathing Cardiovascular status: blood pressure returned to baseline Anesthetic complications: no     Last Vitals:  Vitals:   10/20/16 1805 10/20/16 1820  BP: 122/69 122/72  Pulse: 75 73  Resp: 13 17  Temp:      Last Pain:  Vitals:   10/20/16 1820  TempSrc:   PainSc: 5                  [name]

## 2016-10-23 ENCOUNTER — Encounter: Payer: Self-pay | Admitting: Surgery

## 2016-11-01 DIAGNOSIS — Z9884 Bariatric surgery status: Secondary | ICD-10-CM | POA: Diagnosis not present

## 2016-11-01 DIAGNOSIS — Z5181 Encounter for therapeutic drug level monitoring: Secondary | ICD-10-CM | POA: Diagnosis not present

## 2016-11-01 DIAGNOSIS — K912 Postsurgical malabsorption, not elsewhere classified: Secondary | ICD-10-CM | POA: Diagnosis not present

## 2016-11-20 DIAGNOSIS — R739 Hyperglycemia, unspecified: Secondary | ICD-10-CM | POA: Diagnosis not present

## 2016-11-27 DIAGNOSIS — R739 Hyperglycemia, unspecified: Secondary | ICD-10-CM | POA: Diagnosis not present

## 2016-11-27 DIAGNOSIS — E538 Deficiency of other specified B group vitamins: Secondary | ICD-10-CM | POA: Diagnosis not present

## 2017-01-02 DIAGNOSIS — J0100 Acute maxillary sinusitis, unspecified: Secondary | ICD-10-CM | POA: Diagnosis not present

## 2017-04-12 ENCOUNTER — Other Ambulatory Visit: Payer: Self-pay | Admitting: Internal Medicine

## 2017-04-12 DIAGNOSIS — Z1231 Encounter for screening mammogram for malignant neoplasm of breast: Secondary | ICD-10-CM

## 2017-04-18 DIAGNOSIS — F902 Attention-deficit hyperactivity disorder, combined type: Secondary | ICD-10-CM | POA: Diagnosis not present

## 2017-04-27 ENCOUNTER — Ambulatory Visit
Admission: RE | Admit: 2017-04-27 | Discharge: 2017-04-27 | Disposition: A | Discharge status: Home / Self Care | Payer: 59 | Source: Ambulatory Visit | Attending: Internal Medicine | Admitting: Internal Medicine

## 2017-04-27 DIAGNOSIS — Z1231 Encounter for screening mammogram for malignant neoplasm of breast: Secondary | ICD-10-CM | POA: Insufficient documentation

## 2017-05-21 DIAGNOSIS — E538 Deficiency of other specified B group vitamins: Secondary | ICD-10-CM | POA: Diagnosis not present

## 2017-05-21 DIAGNOSIS — R739 Hyperglycemia, unspecified: Secondary | ICD-10-CM | POA: Diagnosis not present

## 2017-05-28 DIAGNOSIS — Z0000 Encounter for general adult medical examination without abnormal findings: Secondary | ICD-10-CM | POA: Diagnosis not present

## 2017-05-28 DIAGNOSIS — E538 Deficiency of other specified B group vitamins: Secondary | ICD-10-CM | POA: Diagnosis not present

## 2017-05-28 DIAGNOSIS — E119 Type 2 diabetes mellitus without complications: Secondary | ICD-10-CM | POA: Insufficient documentation

## 2017-05-28 DIAGNOSIS — R739 Hyperglycemia, unspecified: Secondary | ICD-10-CM | POA: Diagnosis not present

## 2017-06-06 ENCOUNTER — Encounter: Payer: 59 | Attending: Internal Medicine | Admitting: *Deleted

## 2017-06-06 ENCOUNTER — Encounter: Payer: Self-pay | Admitting: *Deleted

## 2017-06-06 VITALS — BP 118/80 | Ht 65.0 in | Wt 267.6 lb

## 2017-06-06 DIAGNOSIS — E669 Obesity, unspecified: Secondary | ICD-10-CM | POA: Insufficient documentation

## 2017-06-06 DIAGNOSIS — Z713 Dietary counseling and surveillance: Secondary | ICD-10-CM | POA: Diagnosis not present

## 2017-06-06 DIAGNOSIS — Z6841 Body Mass Index (BMI) 40.0 and over, adult: Secondary | ICD-10-CM | POA: Insufficient documentation

## 2017-06-06 DIAGNOSIS — E119 Type 2 diabetes mellitus without complications: Secondary | ICD-10-CM

## 2017-06-06 NOTE — Patient Instructions (Signed)
Check blood sugars 2 x day before breakfast and 2 hrs after one meal every day Bring blood sugar records to the next appointment  Exercise: Continue aerobics for  45  minutes   2  days a week and gradually increase to 150 minutes/week  Eat 3 meals day, 2-3  snacks a day Space meals 4-6 hours apart Complete 3 Day Food Record and bring to next appt  Make an eye doctor appointment  Return for appointment on:  Tuesday June 26, 2017 at 10:30 am with [facility] (dietitian)

## 2017-06-06 NOTE — Progress Notes (Signed)
Diabetes Self-Management Education  Visit Type: First/Initial  Appt. Start Time: 1035 Appt. End Time: 5102  06/06/2017  [name], identified by name and date of birth, is a 46 y.o. female with a diagnosis of Diabetes: Type 2.   ASSESSMENT  Blood pressure 118/80, height 5\' 5"  (1.651 m), weight 267 lb 9.6 oz (121.4 kg). Body mass index is 44.53 kg/m.      Diabetes Self-Management Education - 06/06/17 1157      Visit Information   Visit Type First/Initial     Initial Visit   Diabetes Type Type 2   Are you currently following a meal plan? Yes   What type of meal plan do you follow? reduced carbs, sugar removed, less fruit   Are you taking your medications as prescribed? Yes   Date Diagnosed pt reports "couple of weeks" - A1C 6.5 % in 2017     Psychosocial Assessment   Patient Belief/Attitude about Diabetes Other (comment)  "annoyed"   Self-care barriers None   Self-management support Doctor's office;Family   Patient Concerns Nutrition/Meal planning;Weight Control;Healthy Lifestyle   Special Needs None   Preferred Learning Style Visual   Learning Readiness Change in progress   How often do you need to have someone help you when you read instructions, pamphlets, or other written materials from your doctor or pharmacy? 1 - Never   What is the last grade level you completed in school? Assoc degree     Pre-Education Assessment   Patient understands the diabetes disease and treatment process. Needs Review   Patient understands incorporating nutritional management into lifestyle. Needs Instruction   Patient undertands incorporating physical activity into lifestyle. Needs Review   Patient understands using medications safely. Needs Instruction   Patient understands monitoring blood glucose, interpreting and using results Needs Instruction   Patient understands prevention, detection, and treatment of acute complications. Needs Instruction   Patient understands prevention,  detection, and treatment of chronic complications. Needs Review   Patient understands how to develop strategies to address psychosocial issues. Needs Instruction   Patient understands how to develop strategies to promote health/change behavior. Needs Instruction     Complications   Last HgB A1C per patient/outside source 7.7 %  05/21/17   How often do you check your blood sugar? 0 times/day (not testing)  Pt has a meter but hasn't started testing. She didn't bring to this appointment. Instructed her on use of FreeStyle Freedom Lite meter.    Have you had a dilated eye exam in the past 12 months? No   Have you had a dental exam in the past 12 months? Yes   Are you checking your feet? No     Dietary Intake   Breakfast canadian bacon, egg,  cheese - omelet   Snack (morning) Greek yogurt   [employer] Cuisine, salads with grilled chicken   Snack (afternoon) apple and peanut butter, popcorn, crackers and peanut butter   Dinner chicken, beef, pork, fish - rice, beans, peas, corn, broccoli, cuccumbers, carrots, celery, occasional pasta   Snack (evening) popcorn   Beverage(s) water, unsweetened tea     Exercise   Exercise Type Moderate (swimming / aerobic walking)   How many days per week to you exercise? 2   How many minutes per day do you exercise? 45   Total minutes per week of exercise 90     Patient Education   Previous Diabetes Education Yes (please comment)  Getational diabetes 2006; both parents have diabetes  Disease state  Explored patient's options for treatment of their diabetes   Nutrition management  Role of diet in the treatment of diabetes and the relationship between the three main macronutrients and blood glucose level;Food label reading, portion sizes and measuring food.;Carbohydrate counting;Reviewed blood glucose goals for pre and post meals and how to evaluate the patients' food intake on their blood glucose level.   Physical activity and exercise  Role of exercise on  diabetes management, blood pressure control and cardiac health.   Medications Reviewed patients medication for diabetes, action, purpose, timing of dose and side effects.   Monitoring Taught/evaluated SMBG meter.;Purpose and frequency of SMBG.;Taught/discussed recording of test results and interpretation of SMBG.;Identified appropriate SMBG and/or A1C goals.   Chronic complications Relationship between chronic complications and blood glucose control   Psychosocial adjustment Identified and addressed patients feelings and concerns about diabetes;Role of stress on diabetes     Individualized Goals (developed by patient)   Reducing Risk  Lose weight Lead a healthier lifestyle     Outcomes   Expected Outcomes Demonstrated interest in learning. Expect positive outcomes      Individualized Plan for Diabetes Self-Management Training:   Learning Objective:  Patient will have a greater understanding of diabetes self-management. Patient education plan is to attend individual and/or group sessions per assessed needs and concerns.   Plan:   Patient Instructions  Check blood sugars 2 x day before breakfast and 2 hrs after one meal every day Bring blood sugar records to the next appointment Exercise: Continue aerobics for  45  minutes   2  days a week and gradually increase to 150 minutes/week Eat 3 meals day, 2-3  snacks a day Space meals 4-6 hours apart Complete 3 Day Food Record and bring to next appt Make an eye doctor appointment Return for appointment on:  Tuesday June 26, 2017 at 10:30 am with [facility] (dietitian)  Expected Outcomes:  Demonstrated interest in learning. Expect positive outcomes  Education material provided:  General Meal Planning Guidelines Simple Meal Plan 3 Day Food Record  If problems or questions, patient to contact team via:  Johny Drilling, RN, [place], CDE [phone]  Future DSME appointment:  June 26, 2017 with the dietitian

## 2017-06-26 ENCOUNTER — Encounter: Payer: 59 | Attending: Internal Medicine | Admitting: Dietician

## 2017-06-26 ENCOUNTER — Encounter: Payer: Self-pay | Admitting: Dietician

## 2017-06-26 VITALS — BP 100/70 | Ht 65.0 in | Wt 267.0 lb

## 2017-06-26 DIAGNOSIS — E669 Obesity, unspecified: Secondary | ICD-10-CM | POA: Insufficient documentation

## 2017-06-26 DIAGNOSIS — Z6841 Body Mass Index (BMI) 40.0 and over, adult: Secondary | ICD-10-CM | POA: Diagnosis not present

## 2017-06-26 DIAGNOSIS — Z713 Dietary counseling and surveillance: Secondary | ICD-10-CM | POA: Insufficient documentation

## 2017-06-26 DIAGNOSIS — E119 Type 2 diabetes mellitus without complications: Secondary | ICD-10-CM

## 2017-06-26 NOTE — Progress Notes (Deleted)
Diabetes Self-Management Education  Visit Type:     Appt. Start Time: *** Appt. End Time: ***  06/26/2017  [name], identified by name and date of birth, is a 46 y.o. female with a diagnosis of Diabetes:  .   ASSESSMENT  Blood pressure 100/70, height 5\' 5"  (1.651 m), weight 267 lb (121.1 kg). Body mass index is 44.43 kg/m.     Learning Objective:  Patient will have a greater understanding of diabetes self-management. Patient education plan is to attend individual and/or group sessions per assessed needs and concerns.   Plan:   There are no Patient Instructions on file for this visit.   Expected Outcomes:     Education material provided: {CHL AMB DSME EDUCATION MATERIAL:22611}  If problems or questions, patient to contact team via:  {TYPE OF CONTACT:20355}  Future DSME appointment: -

## 2017-06-26 NOTE — Patient Instructions (Signed)
   Keep working to increase daily movement, and consistent gym/ exercise class visits.   Good job making healthy food choices and eating balanced meals.   Limit snacks in between meals, and control portions.

## 2017-06-26 NOTE — Progress Notes (Signed)
Diabetes Self-Management Education  Visit Type:  Follow-up  Appt. Start Time: 1045 Appt. End Time: 1140  06/26/2017  Kathryn Coffey, identified by name and date of birth, is a 46 y.o. female with a diagnosis of Diabetes:  .   ASSESSMENT  Blood pressure 100/70, height 5\' 5"  (1.651 m), weight 267 lb (121.1 kg). Body mass index is 44.43 kg/m.   Diabetes Self-Management Education - 99/83/38 2505      Complications   How often do you check your blood sugar?  3-4 times / week    Fasting Blood glucose range (mg/dL)  70-129    Postprandial Blood glucose range (mg/dL)  70-129    Have you had a dilated eye exam in the past 12 months?  No    Have you had a dental exam in the past 12 months?  Yes    Are you checking your feet?  Yes    How many days per week are you checking your feet?  1      Dietary Intake   Breakfast  3 meals and 3 snacks daily      Exercise   Exercise Type  Moderate (swimming / aerobic walking)    How many days per week to you exercise?  2    How many minutes per day do you exercise?  45    Total minutes per week of exercise  90      Patient Education   Nutrition management   Other (comment) bariatric diet including meal and snack options and portions   bariatric diet including meal and snack options and portions   Physical activity and exercise   Role of exercise on diabetes management, blood pressure control and cardiac health.;Helped patient identify appropriate exercises in relation to his/her diabetes, diabetes complications and other health issue.    Medications  Reviewed patients medication for diabetes, action, purpose, timing of dose and side effects.    Monitoring  Taught/discussed recording of test results and interpretation of SMBG.    Acute complications  Taught treatment of hypoglycemia - the 15 rule.      Post-Education Assessment   Patient understands the diabetes disease and treatment process.  Demonstrates understanding / competency    Patient  understands incorporating nutritional management into lifestyle.  Demonstrates understanding / competency    Patient undertands incorporating physical activity into lifestyle.  Demonstrates understanding / competency    Patient understands using medications safely.  Demonstrates understanding / competency    Patient understands monitoring blood glucose, interpreting and using results  Demonstrates understanding / competency    Patient understands prevention, detection, and treatment of acute complications.  Demonstrates understanding / competency    Patient understands prevention, detection, and treatment of chronic complications.  Demonstrates understanding / competency    Patient understands how to develop strategies to address psychosocial issues.  Demonstrates understanding / competency    Patient understands how to develop strategies to promote health/change behavior.  Demonstrates understanding / competency      Outcomes   Program Status  Completed       Learning Objective:  Patient will have a greater understanding of diabetes self-management. Patient education plan is to attend individual and/or group sessions per assessed needs and concerns.  Kathryn Coffey has struggled to lose beyond the 50-60lbs she lost after her duodenal switch surgery 1 year ago, in part due to knee injury last summer. She shows good understanding of a healthy diet for diabetes and for weight loss, but states  she is likely overeating some snacks at home and not moving enough. She is working to get back into consistent exercise.    Plan:   Patient Instructions   Keep working to increase daily movement, and consistent gym/ exercise class visits.   Good job making healthy food choices and eating balanced meals.   Limit snacks in between meals, and control portions.     Expected Outcomes:  Demonstrated interest in learning. Expect positive outcomes  Education material provided:   If problems or questions,  patient to contact team via:  Phone and Email  Future DSME appointment: -  none scheduled

## 2017-07-17 DIAGNOSIS — J342 Deviated nasal septum: Secondary | ICD-10-CM | POA: Diagnosis not present

## 2017-07-17 DIAGNOSIS — J329 Chronic sinusitis, unspecified: Secondary | ICD-10-CM | POA: Diagnosis not present

## 2017-07-17 DIAGNOSIS — J301 Allergic rhinitis due to pollen: Secondary | ICD-10-CM | POA: Diagnosis not present

## 2017-07-24 DIAGNOSIS — J301 Allergic rhinitis due to pollen: Secondary | ICD-10-CM | POA: Diagnosis not present

## 2017-08-02 DIAGNOSIS — J301 Allergic rhinitis due to pollen: Secondary | ICD-10-CM | POA: Diagnosis not present

## 2017-08-06 DIAGNOSIS — J301 Allergic rhinitis due to pollen: Secondary | ICD-10-CM | POA: Diagnosis not present

## 2017-08-15 DIAGNOSIS — J301 Allergic rhinitis due to pollen: Secondary | ICD-10-CM | POA: Diagnosis not present

## 2017-08-22 DIAGNOSIS — J301 Allergic rhinitis due to pollen: Secondary | ICD-10-CM | POA: Diagnosis not present

## 2017-08-23 DIAGNOSIS — J301 Allergic rhinitis due to pollen: Secondary | ICD-10-CM | POA: Diagnosis not present

## 2017-08-27 DIAGNOSIS — H52203 Unspecified astigmatism, bilateral: Secondary | ICD-10-CM | POA: Diagnosis not present

## 2017-08-30 DIAGNOSIS — J301 Allergic rhinitis due to pollen: Secondary | ICD-10-CM | POA: Diagnosis not present

## 2017-09-03 DIAGNOSIS — M25561 Pain in right knee: Secondary | ICD-10-CM | POA: Diagnosis not present

## 2017-09-03 DIAGNOSIS — E119 Type 2 diabetes mellitus without complications: Secondary | ICD-10-CM | POA: Diagnosis not present

## 2017-09-03 DIAGNOSIS — M25562 Pain in left knee: Secondary | ICD-10-CM | POA: Diagnosis not present

## 2017-09-03 DIAGNOSIS — G8929 Other chronic pain: Secondary | ICD-10-CM | POA: Diagnosis not present

## 2017-09-03 DIAGNOSIS — J301 Allergic rhinitis due to pollen: Secondary | ICD-10-CM | POA: Diagnosis not present

## 2017-09-03 DIAGNOSIS — M222X1 Patellofemoral disorders, right knee: Secondary | ICD-10-CM | POA: Diagnosis not present

## 2017-09-06 DIAGNOSIS — J301 Allergic rhinitis due to pollen: Secondary | ICD-10-CM | POA: Diagnosis not present

## 2017-09-17 DIAGNOSIS — J301 Allergic rhinitis due to pollen: Secondary | ICD-10-CM | POA: Diagnosis not present

## 2017-09-21 DIAGNOSIS — R739 Hyperglycemia, unspecified: Secondary | ICD-10-CM | POA: Diagnosis not present

## 2017-09-24 DIAGNOSIS — J301 Allergic rhinitis due to pollen: Secondary | ICD-10-CM | POA: Diagnosis not present

## 2017-09-27 DIAGNOSIS — E119 Type 2 diabetes mellitus without complications: Secondary | ICD-10-CM | POA: Diagnosis not present

## 2017-09-27 DIAGNOSIS — E538 Deficiency of other specified B group vitamins: Secondary | ICD-10-CM | POA: Diagnosis not present

## 2017-09-27 DIAGNOSIS — J301 Allergic rhinitis due to pollen: Secondary | ICD-10-CM | POA: Diagnosis not present

## 2017-10-01 DIAGNOSIS — J301 Allergic rhinitis due to pollen: Secondary | ICD-10-CM | POA: Diagnosis not present

## 2017-10-08 DIAGNOSIS — J301 Allergic rhinitis due to pollen: Secondary | ICD-10-CM | POA: Diagnosis not present

## 2017-10-11 DIAGNOSIS — J301 Allergic rhinitis due to pollen: Secondary | ICD-10-CM | POA: Diagnosis not present

## 2017-10-18 DIAGNOSIS — J301 Allergic rhinitis due to pollen: Secondary | ICD-10-CM | POA: Diagnosis not present

## 2017-10-22 DIAGNOSIS — J301 Allergic rhinitis due to pollen: Secondary | ICD-10-CM | POA: Diagnosis not present

## 2017-10-25 DIAGNOSIS — J301 Allergic rhinitis due to pollen: Secondary | ICD-10-CM | POA: Diagnosis not present

## 2017-10-29 DIAGNOSIS — J301 Allergic rhinitis due to pollen: Secondary | ICD-10-CM | POA: Diagnosis not present

## 2017-11-01 DIAGNOSIS — J301 Allergic rhinitis due to pollen: Secondary | ICD-10-CM | POA: Diagnosis not present

## 2017-11-05 DIAGNOSIS — J301 Allergic rhinitis due to pollen: Secondary | ICD-10-CM | POA: Diagnosis not present

## 2017-11-12 DIAGNOSIS — J301 Allergic rhinitis due to pollen: Secondary | ICD-10-CM | POA: Diagnosis not present

## 2017-11-19 DIAGNOSIS — J301 Allergic rhinitis due to pollen: Secondary | ICD-10-CM | POA: Diagnosis not present

## 2017-11-26 DIAGNOSIS — J301 Allergic rhinitis due to pollen: Secondary | ICD-10-CM | POA: Diagnosis not present

## 2017-11-29 DIAGNOSIS — J301 Allergic rhinitis due to pollen: Secondary | ICD-10-CM | POA: Diagnosis not present

## 2017-12-03 DIAGNOSIS — J301 Allergic rhinitis due to pollen: Secondary | ICD-10-CM | POA: Diagnosis not present

## 2017-12-06 DIAGNOSIS — J301 Allergic rhinitis due to pollen: Secondary | ICD-10-CM | POA: Diagnosis not present

## 2017-12-10 DIAGNOSIS — J301 Allergic rhinitis due to pollen: Secondary | ICD-10-CM | POA: Diagnosis not present

## 2017-12-20 DIAGNOSIS — J301 Allergic rhinitis due to pollen: Secondary | ICD-10-CM | POA: Diagnosis not present

## 2017-12-24 DIAGNOSIS — J301 Allergic rhinitis due to pollen: Secondary | ICD-10-CM | POA: Diagnosis not present

## 2017-12-27 DIAGNOSIS — J301 Allergic rhinitis due to pollen: Secondary | ICD-10-CM | POA: Diagnosis not present

## 2017-12-31 DIAGNOSIS — J301 Allergic rhinitis due to pollen: Secondary | ICD-10-CM | POA: Diagnosis not present

## 2018-01-07 ENCOUNTER — Emergency Department
Admission: EM | Admit: 2018-01-07 | Discharge: 2018-01-07 | Disposition: A | Discharge status: Home / Self Care | Payer: 59 | Attending: Emergency Medicine | Admitting: Emergency Medicine

## 2018-01-07 ENCOUNTER — Other Ambulatory Visit: Payer: Self-pay

## 2018-01-07 DIAGNOSIS — J45909 Unspecified asthma, uncomplicated: Secondary | ICD-10-CM | POA: Insufficient documentation

## 2018-01-07 DIAGNOSIS — Z9884 Bariatric surgery status: Secondary | ICD-10-CM | POA: Insufficient documentation

## 2018-01-07 DIAGNOSIS — I10 Essential (primary) hypertension: Secondary | ICD-10-CM | POA: Insufficient documentation

## 2018-01-07 DIAGNOSIS — F902 Attention-deficit hyperactivity disorder, combined type: Secondary | ICD-10-CM | POA: Insufficient documentation

## 2018-01-07 DIAGNOSIS — L509 Urticaria, unspecified: Secondary | ICD-10-CM | POA: Diagnosis present

## 2018-01-07 DIAGNOSIS — E119 Type 2 diabetes mellitus without complications: Secondary | ICD-10-CM | POA: Diagnosis not present

## 2018-01-07 DIAGNOSIS — L500 Allergic urticaria: Secondary | ICD-10-CM | POA: Diagnosis not present

## 2018-01-07 DIAGNOSIS — T7840XA Allergy, unspecified, initial encounter: Secondary | ICD-10-CM | POA: Diagnosis not present

## 2018-01-07 DIAGNOSIS — Z9049 Acquired absence of other specified parts of digestive tract: Secondary | ICD-10-CM | POA: Diagnosis not present

## 2018-01-07 DIAGNOSIS — J301 Allergic rhinitis due to pollen: Secondary | ICD-10-CM | POA: Diagnosis not present

## 2018-01-07 DIAGNOSIS — Z79899 Other long term (current) drug therapy: Secondary | ICD-10-CM | POA: Insufficient documentation

## 2018-01-07 DIAGNOSIS — Z7984 Long term (current) use of oral hypoglycemic drugs: Secondary | ICD-10-CM | POA: Insufficient documentation

## 2018-01-07 MED ORDER — METHYLPREDNISOLONE SODIUM SUCC 125 MG IJ SOLR
INTRAMUSCULAR | Status: AC
  Administered 2018-01-07: 125 mg
  Filled 2018-01-07: qty 2

## 2018-01-07 MED ORDER — FAMOTIDINE IN NACL 20-0.9 MG/50ML-% IV SOLN
INTRAVENOUS | Status: AC
  Administered 2018-01-07: 20 mg
  Filled 2018-01-07: qty 50

## 2018-01-07 MED ORDER — DIPHENHYDRAMINE HCL 50 MG/ML IJ SOLN
25.0000 mg | Freq: Once | INTRAMUSCULAR | Status: AC
  Administered 2018-01-07: 25 mg via INTRAVENOUS

## 2018-01-07 MED ORDER — SODIUM CHLORIDE 0.9 % IV BOLUS
1000.0000 mL | Freq: Once | INTRAVENOUS | Status: AC
  Administered 2018-01-07: 1000 mL via INTRAVENOUS

## 2018-01-07 MED ORDER — PREDNISONE 20 MG PO TABS: 40.0000 mg | ORAL_TABLET | Freq: Every day | ORAL | 0 refills | Status: DC

## 2018-01-07 MED ORDER — DIPHENHYDRAMINE HCL 50 MG/ML IJ SOLN
INTRAMUSCULAR | Status: AC
  Filled 2018-01-07: qty 1

## 2018-01-07 NOTE — ED Triage Notes (Signed)
Pt to ER via POV from allergist office. Pt states that she receives injections to desensitize her for allergies. Received injection naproxen 1425. Took 50mg  benadryl PTA. Eyes puffy, neck reddened, chest reddened. Pt reports roof of mouth is swollen.

## 2018-01-07 NOTE — ED Notes (Signed)
Pt to the ER for an allergic reaction post injection at allergist. Pt received 2 injections. One on the right side and one on the left. The injection in the right arm was grasses, trees and pollen and was the highest dose she has had thus far. Pt has swelling to the eyes bilaterally but predominately on the left side. Pt has swelling in the throat to the left side. Pt does not have tonsils but resembles unilateral tonsilar swelling. No difficulty breathing or swallowing. Pt took 50mg  Benadryl PTA.

## 2018-01-07 NOTE — ED Notes (Signed)
Pt feels much better. Swelling has gone down. Will notify MD.

## 2018-01-07 NOTE — ED Notes (Signed)
Family at bedside. 

## 2018-01-07 NOTE — ED Notes (Signed)
Pt up to bathroom.

## 2018-01-07 NOTE — ED Provider Notes (Signed)
[facility] Emergency Department Provider Note  Time seen: 3:54 PM  I have reviewed the triage vital signs and the nursing notes.   HISTORY  Chief Complaint Allergic Reaction    HPI Kathryn Coffey is a 47 y.o. female with a past medical history of diabetes, gastric reflux, hypertension, obesity, presents to the emergency department for an allergic reaction.  According to the patient she has multiple allergies, is being seen at [facility] for allergy shots.  Had been receiving allergy shots twice a week however they have not been spaced out to once a week for the first time.  This is the first injection the patient has gone 7 days between injections.  Approximately 20 minutes after the injection the patient developed hives, felt swelling in her left Eiad left face and left side of throat.  Patient took 50 mg of oral Benadryl and came to the emergency department for evaluation.   Past Medical History:  Diagnosis Date  . Abdominal wall cellulitis 2013 and 2014  . Asthma    allergic to grasses  . B12 deficiency   . Costochondritis   . Diabetes mellitus without complication ([place])   . Gastric anomaly    multiple small ulcers  . GERD (gastroesophageal reflux disease)   . History of abnormal cervical Pap smear   . History of Clostridium difficile colitis   . History of hiatal hernia   . Hypertension   . IBS (irritable bowel syndrome)   . Migraine   . Morbid obesity ([place])    s/p attempted gastric banding now decompressed  . PCOS (polycystic ovarian syndrome)     Patient Active Problem List   Diagnosis Date Noted  . Controlled type 2 diabetes mellitus without complication, without long-term current use of insulin ([place]) 05/28/2017  . Attention deficit hyperactivity disorder (ADHD), combined type 04/18/2017  . Morbid obesity ([place]) 01/25/2016  . Umbilical hernia 16/05/9603  . Hiatal hernia 12/08/2015  . History of Clostridium difficile colitis 06/19/2014  .  HTN (hypertension) 12/23/2013  . PCOS (polycystic ovarian syndrome) 12/23/2013  . Bariatric surgery status 01/28/2013    Past Surgical History:  Procedure Laterality Date  . BREAST BIOPSY Left    neg  . CESAREAN SECTION    . CHOLECYSTECTOMY    . COLONOSCOPY    . ESOPHAGOGASTRODUODENOSCOPY    . ESOPHAGOGASTRODUODENOSCOPY (EGD) WITH PROPOFOL N/A 12/06/2015   Procedure: ESOPHAGOGASTRODUODENOSCOPY (EGD) WITH PROPOFOL;  Surgeon: Manya Silvas, MD;  Location: [facility] ENDOSCOPY;  Service: Endoscopy;  Laterality: N/A;  . LAPAROSCOPIC GASTRIC BANDING    . LAPAROSCOPIC GASTRIC RESTRICTIVE DUODENAL PROCEDURE (DUODENAL SWITCH) N/A 01/25/2016   Procedure: LAPAROSCOPIC GASTRIC RESTRICTIVE DUODENAL PROCEDURE (DUODENAL SWITCH);  Surgeon: Ladora Daniel, MD;  Location: [facility] ORS;  Service: General;  Laterality: N/A;  . OVARY SURGERY     x2  . TONSILLECTOMY    . TUBAL LIGATION    . UMBILICAL HERNIA REPAIR N/A 01/25/2016   Procedure: LAPAROSCOPIC UMBILICAL HERNIA;  Surgeon: Ladora Daniel, MD;  Location: [facility] ORS;  Service: General;  Laterality: N/A;  . UMBILICAL HERNIA REPAIR N/A 10/20/2016   Procedure: HERNIA REPAIR UMBILICAL ADULT;  Surgeon: Leonie Green, MD;  Location: [facility] ORS;  Service: General;  Laterality: N/A;    Prior to Admission medications   Medication Sig Start Date End Date Taking? Authorizing Provider  albuterol (PROVENTIL HFA;VENTOLIN HFA) 108 (90 Base) MCG/ACT inhaler Inhale 2 puffs into the lungs every 6 (six) hours as needed for wheezing or shortness  of breath.    [provider]  amphetamine-dextroamphetamine (ADDERALL) 10 MG tablet Take 10 mg by mouth daily. 05/28/17   [provider]  Blood Glucose Monitoring Suppl (FREESTYLE LITE) DEVI  05/28/17   [provider]  buPROPion (WELLBUTRIN) 75 MG tablet Take 75 mg by mouth 2 (two) times daily.    [provider]  calcium-vitamin D (SM CALCIUM 500/VITAMIN D3) 500-400 MG-UNIT tablet Take 1 tablet by  mouth daily.     [provider]  Exenatide ER (BYDUREON) 2 MG PEN Inject into the skin. 05/29/17   [provider]  fexofenadine (ALLEGRA) 180 MG tablet Take 180 mg by mouth daily as needed.     [provider]  fluticasone (FLONASE) 50 MCG/ACT nasal spray Place 2 sprays into both nostrils daily as needed for allergies or rhinitis.     [provider]  FREESTYLE LITE test strip  05/28/17   [provider]  glimepiride (AMARYL) 2 MG tablet Take 2 mg by mouth daily. 06/05/17 06/05/18  [provider]  levonorgestrel (MIRENA) 20 MCG/24HR IUD 1 each by Intrauterine route once.    [provider]  losartan-hydrochlorothiazide (HYZAAR) 100-25 MG tablet Take 1 tablet by mouth daily.     [provider]  Multiple Vitamin (MULTI-VITAMINS) TABS Take 1 tablet by mouth daily.     [provider]  triamterene-hydrochlorothiazide (DYAZIDE) 37.5-25 MG capsule Take 1 capsule by mouth daily.     [provider]    Allergies  Allergen Reactions  . Azithromycin Other (See Comments)    Abdominal wall abcess  . Ivp Dye [Iodinated Diagnostic Agents] Hives  . Lexapro [Escitalopram] Other (See Comments)    migraine  . Vicodin [Hydrocodone-Acetaminophen] Other (See Comments)    migraine    Family History  Problem Relation Age of Onset  . Breast cancer Maternal Grandmother 55  . Diabetes Mother   . Diabetes Father     Social History Social History   Tobacco Use  . Smoking status: Never Smoker  . Smokeless tobacco: Never Used  Substance Use Topics  . Alcohol use: Yes    Alcohol/week: 0.0 oz    Comment: SOCIAL  . Drug use: No    Review of Systems Constitutional: Negative for fever. Eyes: Negative for visual complaints ENT: Negative for recent illness/congestion feels mild swelling in the left side of her soft palate. Cardiovascular: Negative for chest pain. Respiratory: Negative for shortness of  breath. Gastrointestinal: Negative for abdominal pain, vomiting and diarrhea. Genitourinary: Negative for urinary compaints Musculoskeletal: Negative for musculoskeletal complaints Skin: Hives around the injection site and chest. Neurological: Negative for headache All other ROS negative  ____________________________________________   PHYSICAL EXAM:  VITAL SIGNS: ED Triage Vitals [01/07/18 1530]  Enc Vitals Group     BP (!) 149/77     Pulse Rate (!) 104     Resp 18     Temp 98.7 F (37.1 C)     Temp Source Oral     SpO2 100 %     Weight 250 lb (113.4 kg)     Height 5\' 5"  (1.651 m)     Head Circumference      Peak Flow      Pain Score 0     Pain Loc      Pain Edu?      Excl. in [place]?    Constitutional: Alert and oriented. Well appearing and in no distress. Eyes: Normal exam ENT   Head: Patient  has mild periorbital edema of the left eye.   Mouth/Throat: Mucous membranes are moist.  Mild soft tissue swelling of the soft palate on the left side.  Airway remains widely patent.  No stridor. Cardiovascular: Normal rate, regular rhythm. No murmur Respiratory: Normal respiratory effort without tachypnea nor retractions. Breath sounds are clear.  No wheeze. Gastrointestinal: Soft and nontender. No distention.  Musculoskeletal: Nontender with normal range of motion in all extremities. Neurologic:  Normal speech and language. No gross focal neurologic deficits  Skin:  Skin is warm.  Moderate hives to right arm, mild flushing/hives to chest. Psychiatric: Mood and affect are normal. Speech and behavior are normal.   ____________________________________________   INITIAL IMPRESSION / ASSESSMENT AND PLAN / ED COURSE  Pertinent labs & imaging results that were available during my care of the patient were reviewed by me and considered in my medical decision making (see chart for details).  Patient presents to the emergency department shortly after receiving an allergy injection  from her allergist office.  It appears the patient had been receiving the injection every 3 to 4 days this is the first time the injection has been spaced out 7 days per patient.  Patient presents with hives around the injection site, chest and swelling to her left face and mild swelling in her left soft palate.  Patient took 50 mg of Benadryl just prior to arrival.  We will dose 25 mg of Benadryl, 125 mg of Solu-Medrol, 20 mg of Pepcid, liter of fluid and continue to closely monitor.  Overall the patient appears extremely well, speaking in complete sentences without any distress, no difficulty breathing.  Patient is feeling much better.  Swelling has improved.  Hives are gone.  Patient is asking to be discharged home.  We will discharge with prednisone for the next 5 days.  Patient has an EpiPen that she carries with her already.  ____________________________________________   FINAL CLINICAL IMPRESSION(S) / ED DIAGNOSES  Allergic reaction    Harvest Dark, MD 01/07/18 1751

## 2018-01-15 DIAGNOSIS — J301 Allergic rhinitis due to pollen: Secondary | ICD-10-CM | POA: Diagnosis not present

## 2018-01-21 DIAGNOSIS — J301 Allergic rhinitis due to pollen: Secondary | ICD-10-CM | POA: Diagnosis not present

## 2018-01-28 DIAGNOSIS — J301 Allergic rhinitis due to pollen: Secondary | ICD-10-CM | POA: Diagnosis not present

## 2018-02-04 DIAGNOSIS — J301 Allergic rhinitis due to pollen: Secondary | ICD-10-CM | POA: Diagnosis not present

## 2018-02-11 DIAGNOSIS — J301 Allergic rhinitis due to pollen: Secondary | ICD-10-CM | POA: Diagnosis not present

## 2018-02-18 DIAGNOSIS — J301 Allergic rhinitis due to pollen: Secondary | ICD-10-CM | POA: Diagnosis not present

## 2018-02-25 DIAGNOSIS — J301 Allergic rhinitis due to pollen: Secondary | ICD-10-CM | POA: Diagnosis not present

## 2018-03-01 DIAGNOSIS — J301 Allergic rhinitis due to pollen: Secondary | ICD-10-CM | POA: Diagnosis not present

## 2018-03-04 DIAGNOSIS — J301 Allergic rhinitis due to pollen: Secondary | ICD-10-CM | POA: Diagnosis not present

## 2018-03-11 DIAGNOSIS — J301 Allergic rhinitis due to pollen: Secondary | ICD-10-CM | POA: Diagnosis not present

## 2018-04-01 DIAGNOSIS — E119 Type 2 diabetes mellitus without complications: Secondary | ICD-10-CM | POA: Diagnosis not present

## 2018-04-01 DIAGNOSIS — J301 Allergic rhinitis due to pollen: Secondary | ICD-10-CM | POA: Diagnosis not present

## 2018-04-01 DIAGNOSIS — E538 Deficiency of other specified B group vitamins: Secondary | ICD-10-CM | POA: Diagnosis not present

## 2018-04-08 DIAGNOSIS — J301 Allergic rhinitis due to pollen: Secondary | ICD-10-CM | POA: Diagnosis not present

## 2018-04-08 DIAGNOSIS — E119 Type 2 diabetes mellitus without complications: Secondary | ICD-10-CM | POA: Diagnosis not present

## 2018-04-08 DIAGNOSIS — E538 Deficiency of other specified B group vitamins: Secondary | ICD-10-CM | POA: Diagnosis not present

## 2018-04-15 ENCOUNTER — Telehealth (INDEPENDENT_AMBULATORY_CARE_PROVIDER_SITE_OTHER): Payer: Self-pay

## 2018-04-15 DIAGNOSIS — J301 Allergic rhinitis due to pollen: Secondary | ICD-10-CM | POA: Diagnosis not present

## 2018-04-15 NOTE — Telephone Encounter (Signed)
Patient called and stated that she has not been seen in the office in a while but she thinks that she may have a broken or busted vein in her leg because it is hard, swollen and purple looking.  She would like to know if this is something that she should be seen for?

## 2018-04-15 NOTE — Telephone Encounter (Signed)
She should place a warm compress to the areas as well.

## 2018-04-15 NOTE — Telephone Encounter (Signed)
Called the patient back to give her instructions as to what she needs to do to speed the healing. She agreed that if her leg didn't get any better that she would give [place] a call back here in the next week or two to get an appointment.

## 2018-04-29 DIAGNOSIS — J301 Allergic rhinitis due to pollen: Secondary | ICD-10-CM | POA: Diagnosis not present

## 2018-05-03 DIAGNOSIS — Z1231 Encounter for screening mammogram for malignant neoplasm of breast: Secondary | ICD-10-CM | POA: Diagnosis not present

## 2018-05-03 DIAGNOSIS — Z01419 Encounter for gynecological examination (general) (routine) without abnormal findings: Secondary | ICD-10-CM | POA: Diagnosis not present

## 2018-05-06 DIAGNOSIS — J301 Allergic rhinitis due to pollen: Secondary | ICD-10-CM | POA: Diagnosis not present

## 2018-05-13 DIAGNOSIS — J301 Allergic rhinitis due to pollen: Secondary | ICD-10-CM | POA: Diagnosis not present

## 2018-05-20 DIAGNOSIS — J301 Allergic rhinitis due to pollen: Secondary | ICD-10-CM | POA: Diagnosis not present

## 2018-05-22 DIAGNOSIS — J301 Allergic rhinitis due to pollen: Secondary | ICD-10-CM | POA: Diagnosis not present

## 2018-05-27 DIAGNOSIS — J301 Allergic rhinitis due to pollen: Secondary | ICD-10-CM | POA: Diagnosis not present

## 2018-05-29 DIAGNOSIS — Z30433 Encounter for removal and reinsertion of intrauterine contraceptive device: Secondary | ICD-10-CM | POA: Diagnosis not present

## 2018-05-29 DIAGNOSIS — Z30430 Encounter for insertion of intrauterine contraceptive device: Secondary | ICD-10-CM | POA: Diagnosis not present

## 2018-05-31 ENCOUNTER — Other Ambulatory Visit: Payer: Self-pay | Admitting: Internal Medicine

## 2018-05-31 DIAGNOSIS — Z1231 Encounter for screening mammogram for malignant neoplasm of breast: Secondary | ICD-10-CM

## 2018-06-03 DIAGNOSIS — J301 Allergic rhinitis due to pollen: Secondary | ICD-10-CM | POA: Diagnosis not present

## 2018-06-10 DIAGNOSIS — J301 Allergic rhinitis due to pollen: Secondary | ICD-10-CM | POA: Diagnosis not present

## 2018-06-13 DIAGNOSIS — M25521 Pain in right elbow: Secondary | ICD-10-CM | POA: Diagnosis not present

## 2018-06-13 DIAGNOSIS — M7701 Medial epicondylitis, right elbow: Secondary | ICD-10-CM | POA: Diagnosis not present

## 2018-06-13 DIAGNOSIS — M25541 Pain in joints of right hand: Secondary | ICD-10-CM | POA: Diagnosis not present

## 2018-06-13 DIAGNOSIS — M25531 Pain in right wrist: Secondary | ICD-10-CM | POA: Diagnosis not present

## 2018-06-17 DIAGNOSIS — J301 Allergic rhinitis due to pollen: Secondary | ICD-10-CM | POA: Diagnosis not present

## 2018-06-20 ENCOUNTER — Ambulatory Visit
Admission: RE | Admit: 2018-06-20 | Discharge: 2018-06-20 | Disposition: A | Discharge status: Home / Self Care | Payer: 59 | Source: Ambulatory Visit | Attending: Internal Medicine | Admitting: Internal Medicine

## 2018-06-20 DIAGNOSIS — Z1231 Encounter for screening mammogram for malignant neoplasm of breast: Secondary | ICD-10-CM | POA: Insufficient documentation

## 2018-06-20 DIAGNOSIS — S0501XA Injury of conjunctiva and corneal abrasion without foreign body, right eye, initial encounter: Secondary | ICD-10-CM | POA: Diagnosis not present

## 2018-06-21 DIAGNOSIS — D1800 Hemangioma unspecified site: Secondary | ICD-10-CM | POA: Diagnosis not present

## 2018-06-21 DIAGNOSIS — B009 Herpesviral infection, unspecified: Secondary | ICD-10-CM

## 2018-06-21 HISTORY — DX: Herpesviral infection, unspecified: B00.9

## 2018-06-25 ENCOUNTER — Other Ambulatory Visit: Payer: Self-pay | Admitting: Internal Medicine

## 2018-06-25 DIAGNOSIS — N6489 Other specified disorders of breast: Secondary | ICD-10-CM

## 2018-06-25 DIAGNOSIS — R928 Other abnormal and inconclusive findings on diagnostic imaging of breast: Secondary | ICD-10-CM

## 2018-06-28 DIAGNOSIS — B0052 Herpesviral keratitis: Secondary | ICD-10-CM | POA: Diagnosis not present

## 2018-07-01 ENCOUNTER — Encounter
Admission: RE | Admit: 2018-07-01 | Discharge: 2018-07-01 | Disposition: A | Discharge status: Home / Self Care | Payer: 59 | Source: Ambulatory Visit | Attending: Surgery | Admitting: Surgery

## 2018-07-01 ENCOUNTER — Other Ambulatory Visit: Payer: Self-pay

## 2018-07-01 DIAGNOSIS — J301 Allergic rhinitis due to pollen: Secondary | ICD-10-CM | POA: Diagnosis not present

## 2018-07-01 HISTORY — DX: Nausea with vomiting, unspecified: R11.2

## 2018-07-01 HISTORY — DX: Other complications of anesthesia, initial encounter: T88.59XA

## 2018-07-01 HISTORY — DX: Other specified postprocedural states: Z98.890

## 2018-07-01 HISTORY — DX: Adverse effect of unspecified anesthetic, initial encounter: T41.45XA

## 2018-07-01 HISTORY — DX: Personal history of urinary calculi: Z87.442

## 2018-07-01 NOTE — Patient Instructions (Addendum)
Your procedure is scheduled on: 07-04-18 THURSDAY Report to Same Day Surgery 2nd floor medical mall [facility] Entrance-take elevator on left to 2nd floor.  Check in with surgery information desk.) To find out your arrival time please call [phone] between 1PM - 3PM on 07-03-18 [facility]  Remember: Instructions that are not followed completely may result in serious medical risk, up to and including death, or upon the discretion of your surgeon and anesthesiologist your surgery may need to be rescheduled.    _x___ 1. Do not eat food after midnight the night before your procedure. NO GUM OR CANDY AFTER  MIDNIGHT. You may drink WATER up to 2 hours before you are scheduled to arrive at the hospital for your procedure.  Do not drink WATER within 2 hours of your scheduled arrival to the hospital.  Type 1 and type 2 diabetics should only drink water.   ____Ensure clear carbohydrate drink on the way to the hospital for bariatric patients  ____Ensure clear carbohydrate drink 3 hours before surgery for Dr Dwyane Luo patients if physician instructed.     __x__ 2. No Alcohol for 24 hours before or after surgery.   __x__3. No Smoking or e-cigarettes for 24 prior to surgery.  Do not use any chewable tobacco products for at least 6 hour prior to surgery   ____  4. Bring all medications with you on the day of surgery if instructed.    __x__ 5. Notify your doctor if there is any change in your medical condition     (cold, fever, infections).    x___6. On the morning of surgery brush your teeth with toothpaste and water.  You may rinse your mouth with mouth wash if you wish.  Do not swallow any toothpaste or mouthwash.   Do not wear jewelry, make-up, hairpins, clips or nail polish.  Do not wear lotions, powders, or perfumes. You may wear deodorant.  Do not shave 48 hours prior to surgery. Men may shave face and neck.  Do not bring valuables to the hospital.    [facility] is not responsible for  any belongings or valuables.               Contacts, dentures or bridgework may not be worn into surgery.  Leave your suitcase in the car. After surgery it may be brought to your room.  For patients admitted to the hospital, discharge time is determined by your treatment team.  _  Patients discharged the day of surgery will not be allowed to drive home.  You will need someone to drive you home and stay with you the night of your procedure.    Please read over the following fact sheets that you were given:   [facility] Preparing for Surgery   _x___ TAKE THE FOLLOWING MEDICATION THE MORNING OF SURGERY WITH A SMALL SIP OF WATER. These include:  1. WELLBUTRIN (BUPROPION)  2.  3.  4.  5.  6.  ____Fleets enema or Magnesium Citrate as directed.   _x___ Use CHG Soap or sage wipes as directed on instruction sheet   _X___ Use inhalers on the day of surgery and bring to hospital day of surgery-USE YOUR ALBUTEROL INHALER DAY OF SURGERY AND [place]  _X___ Stop Metformin days prior to surgery-LAST DOSE ON Monday, November 11TH  ____ Take 1/2 of usual insulin dose the night before surgery and none on the morning surgery.   ____ Follow recommendations from Cardiologist, Pulmonologist or PCP regarding  stopping Aspirin, Coumadin, Plavix ,Eliquis, Effient, or Pradaxa, and Pletal.  X____Stop Anti-inflammatories such as Advil, Aleve, Ibuprofen, Motrin, Naproxen, Naprosyn, Goodies powders or aspirin products NOW-OK to take Tylenol    ____ Stop supplements until after surgery.     ____ Bring C-Pap to the hospital.

## 2018-07-02 ENCOUNTER — Encounter
Admission: RE | Admit: 2018-07-02 | Discharge: 2018-07-02 | Disposition: A | Discharge status: Home / Self Care | Payer: 59 | Source: Ambulatory Visit | Attending: Surgery | Admitting: Surgery

## 2018-07-02 DIAGNOSIS — E119 Type 2 diabetes mellitus without complications: Secondary | ICD-10-CM | POA: Diagnosis not present

## 2018-07-02 DIAGNOSIS — Z8249 Family history of ischemic heart disease and other diseases of the circulatory system: Secondary | ICD-10-CM | POA: Diagnosis not present

## 2018-07-02 DIAGNOSIS — I10 Essential (primary) hypertension: Secondary | ICD-10-CM | POA: Insufficient documentation

## 2018-07-02 DIAGNOSIS — Z79899 Other long term (current) drug therapy: Secondary | ICD-10-CM | POA: Diagnosis not present

## 2018-07-02 DIAGNOSIS — D1801 Hemangioma of skin and subcutaneous tissue: Secondary | ICD-10-CM | POA: Diagnosis not present

## 2018-07-02 DIAGNOSIS — Z01818 Encounter for other preprocedural examination: Secondary | ICD-10-CM

## 2018-07-02 DIAGNOSIS — Z833 Family history of diabetes mellitus: Secondary | ICD-10-CM | POA: Diagnosis not present

## 2018-07-02 LAB — BASIC METABOLIC PANEL
Anion gap: 10 (ref 5–15)
BUN: 19 mg/dL (ref 6–20)
CO2: 26 mmol/L (ref 22–32)
Calcium: 8.7 mg/dL — ABNORMAL LOW (ref 8.9–10.3)
Chloride: 102 mmol/L (ref 98–111)
Creatinine, Ser: 0.73 mg/dL (ref 0.44–1.00)
GFR calc Af Amer: >60 mL/min (ref >60)
GFR calc non Af Amer: >60 mL/min (ref >60)
Glucose, Bld: 132 mg/dL — ABNORMAL HIGH (ref 70–99)
Potassium: 3.8 mmol/L (ref 3.5–5.1)
Sodium: 138 mmol/L (ref 135–145)

## 2018-07-03 ENCOUNTER — Encounter: Payer: Self-pay | Admitting: *Deleted

## 2018-07-03 DIAGNOSIS — M76899 Other specified enthesopathies of unspecified lower limb, excluding foot: Secondary | ICD-10-CM | POA: Diagnosis not present

## 2018-07-03 DIAGNOSIS — K219 Gastro-esophageal reflux disease without esophagitis: Secondary | ICD-10-CM | POA: Diagnosis not present

## 2018-07-03 MED ORDER — CEFAZOLIN SODIUM-DEXTROSE 2-4 GM/100ML-% IV SOLN
2.0000 g | Freq: Once | INTRAVENOUS | Status: AC
  Administered 2018-07-04: 2 g via INTRAVENOUS

## 2018-07-04 ENCOUNTER — Ambulatory Visit: Payer: 59 | Admitting: Anesthesiology

## 2018-07-04 ENCOUNTER — Ambulatory Visit
Admission: RE | Admit: 2018-07-04 | Discharge: 2018-07-04 | Disposition: A | Discharge status: Home / Self Care | Payer: 59 | Source: Ambulatory Visit | Attending: Surgery | Admitting: Surgery

## 2018-07-04 ENCOUNTER — Encounter: Payer: Self-pay | Admitting: *Deleted

## 2018-07-04 ENCOUNTER — Encounter
Admission: RE | Disposition: A | Discharge status: Home / Self Care | Payer: Self-pay | Source: Ambulatory Visit | Attending: Surgery

## 2018-07-04 DIAGNOSIS — Z79899 Other long term (current) drug therapy: Secondary | ICD-10-CM | POA: Diagnosis not present

## 2018-07-04 DIAGNOSIS — Z833 Family history of diabetes mellitus: Secondary | ICD-10-CM | POA: Diagnosis not present

## 2018-07-04 DIAGNOSIS — Z8249 Family history of ischemic heart disease and other diseases of the circulatory system: Secondary | ICD-10-CM | POA: Insufficient documentation

## 2018-07-04 DIAGNOSIS — E119 Type 2 diabetes mellitus without complications: Secondary | ICD-10-CM | POA: Diagnosis not present

## 2018-07-04 DIAGNOSIS — I10 Essential (primary) hypertension: Secondary | ICD-10-CM | POA: Insufficient documentation

## 2018-07-04 DIAGNOSIS — D1801 Hemangioma of skin and subcutaneous tissue: Secondary | ICD-10-CM | POA: Insufficient documentation

## 2018-07-04 DIAGNOSIS — D1809 Hemangioma of other sites: Secondary | ICD-10-CM | POA: Diagnosis not present

## 2018-07-04 DIAGNOSIS — D2111 Benign neoplasm of connective and other soft tissue of right upper limb, including shoulder: Secondary | ICD-10-CM | POA: Diagnosis not present

## 2018-07-04 DIAGNOSIS — D1800 Hemangioma unspecified site: Secondary | ICD-10-CM | POA: Diagnosis not present

## 2018-07-04 HISTORY — DX: Herpesviral infection, unspecified: B00.9

## 2018-07-04 HISTORY — PX: EAR CYST EXCISION: SHX22

## 2018-07-04 LAB — GLUCOSE, CAPILLARY
Glucose-Capillary: 106 mg/dL — ABNORMAL HIGH (ref 70–99)
Glucose-Capillary: 121 mg/dL — ABNORMAL HIGH (ref 70–99)

## 2018-07-04 LAB — POCT PREGNANCY, URINE: Preg Test, Ur: NEGATIVE

## 2018-07-04 SURGERY — EXCISION, SYNOVIAL CYST, POPLITEAL SPACE
Anesthesia: General | Site: Thumb | Laterality: Right

## 2018-07-04 MED ORDER — METOCLOPRAMIDE HCL 10 MG PO TABS: 5.0000 mg | ORAL_TABLET | Freq: Three times a day (TID) | ORAL | Status: DC | PRN

## 2018-07-04 MED ORDER — SODIUM CHLORIDE 0.9 % IV SOLN
INTRAVENOUS | Status: DC
  Administered 2018-07-04: 12:00:00 via INTRAVENOUS

## 2018-07-04 MED ORDER — PROMETHAZINE HCL 25 MG/ML IJ SOLN
6.2500 mg | INTRAMUSCULAR | Status: DC | PRN
  Administered 2018-07-04: 6.25 mg via INTRAVENOUS

## 2018-07-04 MED ORDER — MIDAZOLAM HCL 2 MG/2ML IJ SOLN
INTRAMUSCULAR | Status: AC
  Filled 2018-07-04: qty 2

## 2018-07-04 MED ORDER — POTASSIUM CHLORIDE IN NACL 20-0.9 MEQ/L-% IV SOLN: INTRAVENOUS | Status: DC

## 2018-07-04 MED ORDER — FENTANYL CITRATE (PF) 100 MCG/2ML IJ SOLN
INTRAMUSCULAR | Status: DC | PRN
  Administered 2018-07-04 (×2): 50 ug via INTRAVENOUS

## 2018-07-04 MED ORDER — TRAMADOL HCL 50 MG PO TABS: 50.0000 mg | ORAL_TABLET | Freq: Four times a day (QID) | ORAL | 0 refills | Status: DC | PRN

## 2018-07-04 MED ORDER — PROMETHAZINE HCL 25 MG/ML IJ SOLN
INTRAMUSCULAR | Status: AC
  Filled 2018-07-04: qty 1

## 2018-07-04 MED ORDER — ONDANSETRON HCL 4 MG/2ML IJ SOLN
INTRAMUSCULAR | Status: DC | PRN
  Administered 2018-07-04: 4 mg via INTRAVENOUS

## 2018-07-04 MED ORDER — PROPOFOL 10 MG/ML IV BOLUS
INTRAVENOUS | Status: AC
  Filled 2018-07-04: qty 20

## 2018-07-04 MED ORDER — MEPERIDINE HCL 50 MG/ML IJ SOLN: 6.2500 mg | INTRAMUSCULAR | Status: DC | PRN

## 2018-07-04 MED ORDER — PROPOFOL 10 MG/ML IV BOLUS
INTRAVENOUS | Status: DC | PRN
  Administered 2018-07-04: 200 mg via INTRAVENOUS
  Administered 2018-07-04: 30 mg via INTRAVENOUS

## 2018-07-04 MED ORDER — DEXAMETHASONE SODIUM PHOSPHATE 10 MG/ML IJ SOLN
INTRAMUSCULAR | Status: DC | PRN
  Administered 2018-07-04: 10 mg via INTRAVENOUS

## 2018-07-04 MED ORDER — OXYCODONE HCL 5 MG PO TABS: 5.0000 mg | ORAL_TABLET | Freq: Once | ORAL | Status: DC | PRN

## 2018-07-04 MED ORDER — SODIUM CHLORIDE FLUSH 0.9 % IV SOLN
INTRAVENOUS | Status: AC
  Filled 2018-07-04: qty 10

## 2018-07-04 MED ORDER — LIDOCAINE HCL (PF) 2 % IJ SOLN
INTRAMUSCULAR | Status: AC
  Filled 2018-07-04: qty 10

## 2018-07-04 MED ORDER — OXYCODONE HCL 5 MG/5ML PO SOLN: 5.0000 mg | Freq: Once | ORAL | Status: DC | PRN

## 2018-07-04 MED ORDER — FENTANYL CITRATE (PF) 100 MCG/2ML IJ SOLN: 25.0000 ug | INTRAMUSCULAR | Status: DC | PRN

## 2018-07-04 MED ORDER — ONDANSETRON HCL 4 MG PO TABS: 4.0000 mg | ORAL_TABLET | Freq: Four times a day (QID) | ORAL | Status: DC | PRN

## 2018-07-04 MED ORDER — LIDOCAINE HCL (CARDIAC) PF 100 MG/5ML IV SOSY
PREFILLED_SYRINGE | INTRAVENOUS | Status: DC | PRN
  Administered 2018-07-04: 100 mg via INTRAVENOUS

## 2018-07-04 MED ORDER — FENTANYL CITRATE (PF) 100 MCG/2ML IJ SOLN
INTRAMUSCULAR | Status: AC
  Filled 2018-07-04: qty 2

## 2018-07-04 MED ORDER — ACETAMINOPHEN 500 MG PO TABS
ORAL_TABLET | ORAL | Status: AC
  Administered 2018-07-04: 1000 mg via ORAL
  Filled 2018-07-04: qty 2

## 2018-07-04 MED ORDER — ONDANSETRON HCL 4 MG/2ML IJ SOLN: 4.0000 mg | Freq: Four times a day (QID) | INTRAMUSCULAR | Status: DC | PRN

## 2018-07-04 MED ORDER — ACETAMINOPHEN 500 MG PO TABS
1000.0000 mg | ORAL_TABLET | Freq: Once | ORAL | Status: AC
  Administered 2018-07-04: 1000 mg via ORAL

## 2018-07-04 MED ORDER — CEFAZOLIN SODIUM-DEXTROSE 2-4 GM/100ML-% IV SOLN
INTRAVENOUS | Status: AC
  Filled 2018-07-04: qty 100

## 2018-07-04 MED ORDER — BUPIVACAINE HCL (PF) 0.5 % IJ SOLN
INTRAMUSCULAR | Status: AC
  Filled 2018-07-04: qty 30

## 2018-07-04 MED ORDER — BUPIVACAINE HCL (PF) 0.5 % IJ SOLN
INTRAMUSCULAR | Status: DC | PRN
  Administered 2018-07-04: 10 mL

## 2018-07-04 MED ORDER — METOCLOPRAMIDE HCL 5 MG/ML IJ SOLN: 5.0000 mg | Freq: Three times a day (TID) | INTRAMUSCULAR | Status: DC | PRN

## 2018-07-04 MED ORDER — MIDAZOLAM HCL 2 MG/2ML IJ SOLN
INTRAMUSCULAR | Status: DC | PRN
  Administered 2018-07-04: 2 mg via INTRAVENOUS

## 2018-07-04 MED ORDER — ACETAMINOPHEN 10 MG/ML IV SOLN
INTRAVENOUS | Status: AC
  Filled 2018-07-04: qty 100

## 2018-07-04 SURGICAL SUPPLY — 27 items
BNDG ESMARK 4X12 TAN STRL LF (GAUZE/BANDAGES/DRESSINGS) ×2 IMPLANT
CANISTER SUCT 1200ML W/VALVE (MISCELLANEOUS) ×2 IMPLANT
CHLORAPREP W/TINT 26ML (MISCELLANEOUS) ×2 IMPLANT
CORD BIP STRL DISP 12FT (MISCELLANEOUS) ×2 IMPLANT
COVER WAND RF STERILE (DRAPES) IMPLANT
CUFF TOURN 18 STER (MISCELLANEOUS) ×2 IMPLANT
DRAPE SURG 17X11 SM STRL (DRAPES) ×4 IMPLANT
FORCEPS JEWEL BIP 4-3/4 STR (INSTRUMENTS) ×2 IMPLANT
GAUZE PETRO XEROFOAM 1X8 (MISCELLANEOUS) ×2 IMPLANT
GAUZE SPONGE 4X4 12PLY STRL (GAUZE/BANDAGES/DRESSINGS) ×2 IMPLANT
GLOVE BIO SURGEON STRL SZ8 (GLOVE) ×4 IMPLANT
GLOVE INDICATOR 8.0 STRL GRN (GLOVE) ×2 IMPLANT
GOWN STRL REUS W/ TWL LRG LVL3 (GOWN DISPOSABLE) ×1 IMPLANT
GOWN STRL REUS W/ TWL XL LVL3 (GOWN DISPOSABLE) ×1 IMPLANT
GOWN STRL REUS W/TWL LRG LVL3 (GOWN DISPOSABLE) ×1
GOWN STRL REUS W/TWL XL LVL3 (GOWN DISPOSABLE) ×1
KIT TURNOVER KIT A (KITS) ×2 IMPLANT
NEEDLE HYPO 25X1 1.5 SAFETY (NEEDLE) ×2 IMPLANT
NS IRRIG 500ML POUR BTL (IV SOLUTION) ×2 IMPLANT
PACK EXTREMITY ARMC (MISCELLANEOUS) ×2 IMPLANT
PAD CAST CTTN 4X4 STRL (SOFTGOODS) IMPLANT
PADDING CAST COTTON 4X4 STRL (SOFTGOODS)
SPLINT WRIST LG LT TX990309 (SOFTGOODS) IMPLANT
SPLINT WRIST M LT TX990308 (SOFTGOODS) IMPLANT
SPLINT WRIST M RT TX990303 (SOFTGOODS) IMPLANT
STOCKINETTE STRL 4IN 9604848 (GAUZE/BANDAGES/DRESSINGS) ×2 IMPLANT
SUT PROLENE 4 0 PS 2 18 (SUTURE) ×2 IMPLANT

## 2018-07-04 NOTE — OR Nursing (Signed)
Discharge instructions discussed with pt and mom. Both voice understanding.

## 2018-07-04 NOTE — Op Note (Signed)
07/04/2018  2:35 PM  Patient:   Kathryn Coffey  Pre-Op Diagnosis:   Glomus tumor, right thumb.  Post-Op Diagnosis:   Same  Procedure:   Excision of glomus tumor, right thumb.  Surgeon:   Pascal Lux, MD  Assistant:   Phoebe Sharps, PA-S  Anesthesia:   General LMA  Findings:   As above.  Complications:   None  Fluids:   600 cc crystalloid  EBL:   0.5 cc  UOP:   None  TT:   26 minutes at 250 mmHg  Drains:   None  Closure:   4-0 Prolene interrupted sutures  Brief Clinical Note:   The patient is a 47 year old female with a several year history of right dorsal thumbnail pain.  Her symptoms have persisted despite medications, activity modification, etc.  Her history and examination are consistent with a glomus tumor evolving the nailbed.  The patient presents at this time for incision of the glomus tumor.  Procedure:   The patient was brought into the operating room and lain in the supine position.  After adequate general laryngeal mask anesthesia was obtained, the patient's right hand and upper extremity were prepped with ChloraPrep solution before being draped sterilely.  Preoperative antibiotic's were administered.  A timeout was performed to verify the appropriate surgical site before the limb was exsanguinated with an Esmarch and the tourniquet inflated to 250 mmHg.  A total of 10 cc of 0.5% plain Sensorcaine was injected in around the base of the thumb to act as a digital block both to help reduce any potential pain during the procedure, as well as to help with postoperative pain.  The nail plate was carefully elevated off the nail bed and removed in its entirety.  The site of the glomus tumor was identified at the proximal end of the nail bed.  A longitudinal incision was made through the nailbed to expose the glomus tumor beneath it.  This tissue was carefully ellipsed out and removed before being sent to pathology for definitive identification.  The wound was thoroughly  irrigated with sterile saline solution using bulb irrigation before the nailbed was reapproximated using 5-0 chromic catgut interrupted sutures.  The nail plate was repositioned and secured using two 4-0 Prolene interrupted sutures.  A sterile bulky dressing was applied to the thumb before the patient was awakened, extubated, and returned to the recovery room in satisfactory condition after tolerating the procedure well.

## 2018-07-04 NOTE — Discharge Instructions (Addendum)
Orthopedic discharge instructions: Keep dressing dry and intact. Keep hand elevated above heart level. May shower after dressing removed on postop day 4 (Monday). Cover sutures with Band-Aids after drying off. Apply ice to affected area frequently. Take ES Tylenol or pain medication as prescribed when needed.  Return for follow-up in 10-14 days or as scheduled.  AMBULATORY SURGERY  DISCHARGE INSTRUCTIONS   1) The drugs that you were given will stay in your system until tomorrow so for the next 24 hours you should not:  A) Drive an automobile B) Make any legal decisions C) Drink any alcoholic beverage   2) You may resume regular meals tomorrow.  Today it is better to start with liquids and gradually work up to solid foods.  You may eat anything you prefer, but it is better to start with liquids, then soup and crackers, and gradually work up to solid foods.   3) Please notify your doctor immediately if you have any unusual bleeding, trouble breathing, redness and pain at the surgery site, drainage, fever, or pain not relieved by medication.    4) Additional Instructions:        Please contact your physician with any problems or Same Day Surgery at [phone], Monday through Friday 6 am to 4 pm, or [place] [facility] [place] Main number at [phone]. 

## 2018-07-04 NOTE — Anesthesia Procedure Notes (Addendum)
Procedure Name: LMA Insertion Date/Time: 07/04/2018 1:43 PM Performed by: Justus Memory, CRNA Pre-anesthesia Checklist: Patient identified, Patient being monitored, Timeout performed, Emergency Drugs available and Suction available Patient Re-evaluated:Patient Re-evaluated prior to induction Oxygen Delivery Method: Circle system utilized Preoxygenation: Pre-oxygenation with 100% oxygen Induction Type: IV induction Ventilation: Mask ventilation without difficulty LMA: LMA inserted LMA Size: 4.5 Tube type: Oral Number of attempts: 1 Placement Confirmation: positive ETCO2 and breath sounds checked- equal and bilateral Tube secured with: Tape Dental Injury: Teeth and Oropharynx as per pre-operative assessment

## 2018-07-04 NOTE — H&P (Signed)
Paper H&P to be scanned into permanent record. H&P reviewed and patient re-examined. No changes. 

## 2018-07-04 NOTE — Anesthesia Post-op Follow-up Note (Signed)
Anesthesia QCDR form completed.        

## 2018-07-04 NOTE — Anesthesia Preprocedure Evaluation (Signed)
Anesthesia Evaluation  Patient identified by MRN, date of birth, ID band Patient awake    Reviewed: Allergy & Precautions, NPO status , Patient's Chart, lab work & pertinent test results  History of Anesthesia Complications (+) PONV and history of anesthetic complications (N/V after spinal)  Airway Mallampati: II  TM Distance: >3 FB Neck ROM: Full    Dental no notable dental hx.    Pulmonary asthma , neg sleep apnea,    breath sounds clear to auscultation- rhonchi (-) wheezing      Cardiovascular hypertension, Pt. on medications (-) CAD, (-) Past MI, (-) Cardiac Stents and (-) CABG  Rhythm:Regular Rate:Normal - Systolic murmurs and - Diastolic murmurs    Neuro/Psych  Headaches, PSYCHIATRIC DISORDERS    GI/Hepatic Neg liver ROS, hiatal hernia, GERD  ,  Endo/Other  diabetes, Oral Hypoglycemic Agents  Renal/GU negative Renal ROS     Musculoskeletal negative musculoskeletal ROS (+)   Abdominal (+) + obese,   Peds  Hematology negative hematology ROS (+)   Anesthesia Other Findings Past Medical History: 2013 and 2014: Abdominal wall cellulitis No date: Asthma     Comment:  allergic to grasses No date: B12 deficiency No date: Complication of anesthesia No date: Costochondritis No date: Diabetes mellitus without complication (HCC) No date: Gastric anomaly     Comment:  multiple small ulcers No date: GERD (gastroesophageal reflux disease) 06/2018: Herpes     Comment:  POSSIBLY IN EYE- PT TAKING VALTREX AND WILL SEE               OPTHAMOLOGIST TO SEE IF VALTREX IS WORKING No date: History of abnormal cervical Pap smear No date: History of Clostridium difficile colitis No date: History of hiatal hernia No date: History of kidney stones No date: Hypertension No date: IBS (irritable bowel syndrome) No date: Migraine No date: Morbid obesity ([place])     Comment:  s/p attempted gastric banding now decompressed No date:  PCOS (polycystic ovarian syndrome) No date: PONV (postoperative nausea and vomiting)     Comment:  WITH SPINAL ONLY   Reproductive/Obstetrics                             Anesthesia Physical Anesthesia Plan  ASA: II  Anesthesia Plan: General   Post-op Pain Management:    Induction: Intravenous  PONV Risk Score and Plan: 3 and Ondansetron, Dexamethasone and Midazolam  Airway Management Planned: LMA  Additional Equipment:   Intra-op Plan:   Post-operative Plan:   Informed Consent: I have reviewed the patients History and Physical, chart, labs and discussed the procedure including the risks, benefits and alternatives for the proposed anesthesia with the patient or authorized representative who has indicated his/her understanding and acceptance.   Dental advisory given  Plan Discussed with: CRNA and Anesthesiologist  Anesthesia Plan Comments:         Anesthesia Quick Evaluation

## 2018-07-05 ENCOUNTER — Ambulatory Visit
Admission: RE | Admit: 2018-07-05 | Discharge: 2018-07-05 | Disposition: A | Discharge status: Home / Self Care | Payer: 59 | Source: Ambulatory Visit | Attending: Internal Medicine | Admitting: Internal Medicine

## 2018-07-05 ENCOUNTER — Encounter: Payer: Self-pay | Admitting: Surgery

## 2018-07-05 DIAGNOSIS — R928 Other abnormal and inconclusive findings on diagnostic imaging of breast: Secondary | ICD-10-CM

## 2018-07-05 DIAGNOSIS — N6489 Other specified disorders of breast: Secondary | ICD-10-CM

## 2018-07-05 DIAGNOSIS — B0052 Herpesviral keratitis: Secondary | ICD-10-CM | POA: Diagnosis not present

## 2018-07-05 NOTE — Transfer of Care (Signed)
Immediate Anesthesia Transfer of Care Note  Patient: Kathryn Coffey  Procedure(s) Performed: EXCISION GLOMUS TUMOR THUMBNAIL (Right Thumb)  Patient Location: PACU  Anesthesia Type:General  Level of Consciousness: sedated  Airway & Oxygen Therapy: Patient Spontanous Breathing and Patient connected to face mask oxygen  Post-op Assessment: Report given to RN and Post -op Vital signs reviewed and stable  Post vital signs: Reviewed and stable  Last Vitals:  Vitals Value Taken Time  BP    Temp    Pulse    Resp    SpO2      Last Pain:  Vitals:   07/05/18 0813  TempSrc:   PainSc: 0-No pain         Complications: No apparent anesthesia complications

## 2018-07-08 DIAGNOSIS — J301 Allergic rhinitis due to pollen: Secondary | ICD-10-CM | POA: Diagnosis not present

## 2018-07-08 LAB — SURGICAL PATHOLOGY

## 2018-07-08 NOTE — Anesthesia Postprocedure Evaluation (Signed)
Anesthesia Post Note  Patient: MAUDENE STOTLER  Procedure(s) Performed: EXCISION GLOMUS TUMOR THUMBNAIL (Right Thumb)  Patient location during evaluation: PACU Anesthesia Type: General Level of consciousness: awake and alert and oriented Pain management: pain level controlled Vital Signs Assessment: post-procedure vital signs reviewed and stable Respiratory status: spontaneous breathing, nonlabored ventilation and respiratory function stable Cardiovascular status: blood pressure returned to baseline and stable Postop Assessment: no signs of nausea or vomiting Anesthetic complications: no     Last Vitals:  Vitals:   07/04/18 1550 07/04/18 1558  BP: 128/82 133/86  Pulse: 82 80  Resp: 14 16  Temp: 36.9 C 36.8 C  SpO2: 95% 95%    Last Pain:  Vitals:   07/05/18 0813  TempSrc:   PainSc: 0-No pain                 Anjela Cassara

## 2018-07-15 DIAGNOSIS — J301 Allergic rhinitis due to pollen: Secondary | ICD-10-CM | POA: Diagnosis not present

## 2018-07-16 ENCOUNTER — Other Ambulatory Visit: Payer: Self-pay | Admitting: Orthopedic Surgery

## 2018-07-16 DIAGNOSIS — D1800 Hemangioma unspecified site: Secondary | ICD-10-CM

## 2018-07-16 DIAGNOSIS — M25561 Pain in right knee: Secondary | ICD-10-CM

## 2018-07-16 DIAGNOSIS — G8929 Other chronic pain: Secondary | ICD-10-CM

## 2018-07-16 DIAGNOSIS — M25361 Other instability, right knee: Secondary | ICD-10-CM

## 2018-07-22 ENCOUNTER — Ambulatory Visit
Admission: RE | Admit: 2018-07-22 | Discharge: 2018-07-22 | Disposition: A | Discharge status: Home / Self Care | Payer: 59 | Source: Ambulatory Visit | Attending: Orthopedic Surgery | Admitting: Orthopedic Surgery

## 2018-07-22 DIAGNOSIS — G8929 Other chronic pain: Secondary | ICD-10-CM | POA: Diagnosis not present

## 2018-07-22 DIAGNOSIS — D1800 Hemangioma unspecified site: Secondary | ICD-10-CM | POA: Insufficient documentation

## 2018-07-22 DIAGNOSIS — M25461 Effusion, right knee: Secondary | ICD-10-CM | POA: Diagnosis not present

## 2018-07-22 DIAGNOSIS — M25561 Pain in right knee: Secondary | ICD-10-CM | POA: Insufficient documentation

## 2018-07-22 DIAGNOSIS — M25361 Other instability, right knee: Secondary | ICD-10-CM | POA: Insufficient documentation

## 2018-07-22 DIAGNOSIS — X58XXXA Exposure to other specified factors, initial encounter: Secondary | ICD-10-CM | POA: Diagnosis not present

## 2018-07-22 DIAGNOSIS — S83011A Lateral subluxation of right patella, initial encounter: Secondary | ICD-10-CM | POA: Diagnosis not present

## 2018-07-22 DIAGNOSIS — S83281A Other tear of lateral meniscus, current injury, right knee, initial encounter: Secondary | ICD-10-CM | POA: Insufficient documentation

## 2018-07-22 DIAGNOSIS — J301 Allergic rhinitis due to pollen: Secondary | ICD-10-CM | POA: Diagnosis not present

## 2018-07-30 DIAGNOSIS — J301 Allergic rhinitis due to pollen: Secondary | ICD-10-CM | POA: Diagnosis not present

## 2018-08-16 DIAGNOSIS — J301 Allergic rhinitis due to pollen: Secondary | ICD-10-CM | POA: Diagnosis not present

## 2018-11-11 MED FILL — buPROPion HCL 75 MG TABS: 75 | 30 days supply | Qty: 60 | Fill #0

## 2018-11-11 MED FILL — ACCU-CHEK GUIDE STRP: 50 days supply | Qty: 100 | Fill #0

## 2018-11-11 MED FILL — ACCU-CHEK FASTCLIX LANCETS: 51 days supply | Qty: 102 | Fill #0

## 2018-11-11 MED FILL — VICTOZA 18 MG/3 ML INJECT P: 18 | 30 days supply | Qty: 6 | Fill #0

## 2018-11-11 MED FILL — TRIAMTERENE/HCTZ 37.5/25 CP: 37.5-25 | 90 days supply | Qty: 90 | Fill #0

## 2018-11-12 MED FILL — AMPHETAMINE-DEXTROAMPHETAMI: 10 | 30 days supply | Qty: 30 | Fill #0

## 2018-11-12 MED FILL — LOSARTAN-HCTZ 100-25 MG TAB: 100-25 | 90 days supply | Qty: 90 | Fill #0 | Status: TO

## 2018-11-12 MED FILL — metFORMIN HCL 500 MG TABS: 500 | 90 days supply | Qty: 180 | Fill #0 | Status: TO

## 2018-11-12 MED FILL — OMEPRAZOLE 20 MG CPDR: 20 | 90 days supply | Qty: 90 | Fill #0 | Status: TO

## 2018-12-12 MED FILL — AMPHETAMINE-DEXTROAMPHETAMI: 10 | 30 days supply | Qty: 30 | Fill #0

## 2018-12-18 MED FILL — VICTOZA 18 MG/3 ML INJECT P: 18 | 90 days supply | Qty: 18 | Fill #1 | Status: TO

## 2018-12-19 MED FILL — buPROPion HCL 75 MG TABS: 75 | 30 days supply | Qty: 60 | Fill #1 | Status: TO

## 2019-05-25 ENCOUNTER — Emergency Department
Admission: EM | Admit: 2019-05-25 | Discharge: 2019-05-25 | Disposition: A | Discharge status: Home / Self Care | Payer: Self-pay | Attending: Emergency Medicine | Admitting: Emergency Medicine

## 2019-05-25 ENCOUNTER — Emergency Department: Payer: Self-pay

## 2019-05-25 ENCOUNTER — Other Ambulatory Visit: Payer: Self-pay

## 2019-05-25 DIAGNOSIS — M79606 Pain in leg, unspecified: Secondary | ICD-10-CM

## 2019-05-25 DIAGNOSIS — R2241 Localized swelling, mass and lump, right lower limb: Secondary | ICD-10-CM | POA: Insufficient documentation

## 2019-05-25 DIAGNOSIS — Z5321 Procedure and treatment not carried out due to patient leaving prior to being seen by health care provider: Secondary | ICD-10-CM | POA: Insufficient documentation

## 2019-05-25 NOTE — ED Triage Notes (Signed)
Pt to the er for possible DVT. Pt was outside working in the yard when she suddenly felt burning and itching to the right lower leg lateral side and a vein was grossly swollen. Pt has been admitted in the past for possible PE but MRI showed negative. Pt reports pain to the area.

## 2019-05-26 ENCOUNTER — Telehealth: Payer: Self-pay | Admitting: Emergency Medicine

## 2019-05-26 NOTE — Telephone Encounter (Signed)
Called patient due to lwot to inquire about condition and follow up plans.  Says she has already messaged dr Sabra Heck to see what to do next.

## 2019-06-25 ENCOUNTER — Other Ambulatory Visit: Payer: Self-pay | Admitting: Internal Medicine

## 2019-06-25 DIAGNOSIS — Z1231 Encounter for screening mammogram for malignant neoplasm of breast: Secondary | ICD-10-CM

## 2019-07-15 ENCOUNTER — Inpatient Hospital Stay (HOSPITAL_COMMUNITY): Payer: No Typology Code available for payment source

## 2019-07-15 ENCOUNTER — Emergency Department (HOSPITAL_COMMUNITY): Payer: No Typology Code available for payment source

## 2019-07-15 ENCOUNTER — Emergency Department (HOSPITAL_COMMUNITY): Payer: No Typology Code available for payment source | Admitting: Certified Registered Nurse Anesthetist

## 2019-07-15 ENCOUNTER — Inpatient Hospital Stay (HOSPITAL_COMMUNITY)
Admission: EM | Admit: 2019-07-15 | Discharge: 2019-07-22 | DRG: 023 | Disposition: A | Discharge status: Rehabilitation Facility | Payer: No Typology Code available for payment source | Attending: Neurology | Admitting: Neurology

## 2019-07-15 ENCOUNTER — Encounter (HOSPITAL_COMMUNITY)
Admission: EM | Disposition: A | Discharge status: Rehabilitation Facility | Payer: Self-pay | Source: Home / Self Care | Attending: Neurology

## 2019-07-15 ENCOUNTER — Encounter (HOSPITAL_COMMUNITY): Payer: Self-pay | Admitting: Interventional Radiology

## 2019-07-15 DIAGNOSIS — R29718 NIHSS score 18: Secondary | ICD-10-CM | POA: Diagnosis not present

## 2019-07-15 DIAGNOSIS — Z7984 Long term (current) use of oral hypoglycemic drugs: Secondary | ICD-10-CM

## 2019-07-15 DIAGNOSIS — Y92002 Bathroom of unspecified non-institutional (private) residence single-family (private) house as the place of occurrence of the external cause: Secondary | ICD-10-CM

## 2019-07-15 DIAGNOSIS — G4700 Insomnia, unspecified: Secondary | ICD-10-CM | POA: Diagnosis present

## 2019-07-15 DIAGNOSIS — I6389 Other cerebral infarction: Secondary | ICD-10-CM

## 2019-07-15 DIAGNOSIS — R29810 Facial weakness: Secondary | ICD-10-CM | POA: Diagnosis present

## 2019-07-15 DIAGNOSIS — W19XXXA Unspecified fall, initial encounter: Secondary | ICD-10-CM | POA: Diagnosis present

## 2019-07-15 DIAGNOSIS — G8929 Other chronic pain: Secondary | ICD-10-CM | POA: Diagnosis present

## 2019-07-15 DIAGNOSIS — Z6841 Body Mass Index (BMI) 40.0 and over, adult: Secondary | ICD-10-CM | POA: Diagnosis not present

## 2019-07-15 DIAGNOSIS — G9340 Encephalopathy, unspecified: Secondary | ICD-10-CM | POA: Diagnosis not present

## 2019-07-15 DIAGNOSIS — I618 Other nontraumatic intracerebral hemorrhage: Secondary | ICD-10-CM | POA: Diagnosis not present

## 2019-07-15 DIAGNOSIS — E1165 Type 2 diabetes mellitus with hyperglycemia: Secondary | ICD-10-CM | POA: Diagnosis present

## 2019-07-15 DIAGNOSIS — I10 Essential (primary) hypertension: Secondary | ICD-10-CM | POA: Diagnosis present

## 2019-07-15 DIAGNOSIS — M25562 Pain in left knee: Secondary | ICD-10-CM | POA: Diagnosis present

## 2019-07-15 DIAGNOSIS — I63411 Cerebral infarction due to embolism of right middle cerebral artery: Secondary | ICD-10-CM | POA: Diagnosis not present

## 2019-07-15 DIAGNOSIS — F329 Major depressive disorder, single episode, unspecified: Secondary | ICD-10-CM | POA: Diagnosis not present

## 2019-07-15 DIAGNOSIS — Z975 Presence of (intrauterine) contraceptive device: Secondary | ICD-10-CM

## 2019-07-15 DIAGNOSIS — I63511 Cerebral infarction due to unspecified occlusion or stenosis of right middle cerebral artery: Secondary | ICD-10-CM | POA: Diagnosis not present

## 2019-07-15 DIAGNOSIS — D62 Acute posthemorrhagic anemia: Secondary | ICD-10-CM | POA: Diagnosis not present

## 2019-07-15 DIAGNOSIS — I7771 Dissection of carotid artery: Secondary | ICD-10-CM | POA: Diagnosis present

## 2019-07-15 DIAGNOSIS — I6601 Occlusion and stenosis of right middle cerebral artery: Secondary | ICD-10-CM | POA: Diagnosis not present

## 2019-07-15 DIAGNOSIS — Z9884 Bariatric surgery status: Secondary | ICD-10-CM | POA: Diagnosis not present

## 2019-07-15 DIAGNOSIS — E1151 Type 2 diabetes mellitus with diabetic peripheral angiopathy without gangrene: Secondary | ICD-10-CM | POA: Diagnosis present

## 2019-07-15 DIAGNOSIS — Z9049 Acquired absence of other specified parts of digestive tract: Secondary | ICD-10-CM

## 2019-07-15 DIAGNOSIS — Z9851 Tubal ligation status: Secondary | ICD-10-CM | POA: Diagnosis not present

## 2019-07-15 DIAGNOSIS — Z881 Allergy status to other antibiotic agents status: Secondary | ICD-10-CM

## 2019-07-15 DIAGNOSIS — H53462 Homonymous bilateral field defects, left side: Secondary | ICD-10-CM | POA: Diagnosis present

## 2019-07-15 DIAGNOSIS — G8104 Flaccid hemiplegia affecting left nondominant side: Secondary | ICD-10-CM | POA: Diagnosis present

## 2019-07-15 DIAGNOSIS — I639 Cerebral infarction, unspecified: Secondary | ICD-10-CM | POA: Diagnosis present

## 2019-07-15 DIAGNOSIS — R350 Frequency of micturition: Secondary | ICD-10-CM | POA: Diagnosis not present

## 2019-07-15 DIAGNOSIS — I6521 Occlusion and stenosis of right carotid artery: Secondary | ICD-10-CM | POA: Diagnosis present

## 2019-07-15 DIAGNOSIS — Z885 Allergy status to narcotic agent status: Secondary | ICD-10-CM

## 2019-07-15 DIAGNOSIS — G8194 Hemiplegia, unspecified affecting left nondominant side: Secondary | ICD-10-CM | POA: Diagnosis not present

## 2019-07-15 DIAGNOSIS — Z79899 Other long term (current) drug therapy: Secondary | ICD-10-CM

## 2019-07-15 DIAGNOSIS — J95821 Acute postprocedural respiratory failure: Secondary | ICD-10-CM

## 2019-07-15 DIAGNOSIS — J9601 Acute respiratory failure with hypoxia: Secondary | ICD-10-CM | POA: Diagnosis not present

## 2019-07-15 DIAGNOSIS — I63231 Cerebral infarction due to unspecified occlusion or stenosis of right carotid arteries: Secondary | ICD-10-CM | POA: Diagnosis not present

## 2019-07-15 DIAGNOSIS — B9620 Unspecified Escherichia coli [E. coli] as the cause of diseases classified elsewhere: Secondary | ICD-10-CM | POA: Diagnosis not present

## 2019-07-15 DIAGNOSIS — K589 Irritable bowel syndrome without diarrhea: Secondary | ICD-10-CM | POA: Diagnosis present

## 2019-07-15 DIAGNOSIS — D72829 Elevated white blood cell count, unspecified: Secondary | ICD-10-CM | POA: Diagnosis present

## 2019-07-15 DIAGNOSIS — M25311 Other instability, right shoulder: Secondary | ICD-10-CM | POA: Diagnosis present

## 2019-07-15 DIAGNOSIS — B0230 Zoster ocular disease, unspecified: Secondary | ICD-10-CM | POA: Diagnosis present

## 2019-07-15 DIAGNOSIS — Z87442 Personal history of urinary calculi: Secondary | ICD-10-CM | POA: Diagnosis not present

## 2019-07-15 DIAGNOSIS — Z20828 Contact with and (suspected) exposure to other viral communicable diseases: Secondary | ICD-10-CM | POA: Diagnosis present

## 2019-07-15 DIAGNOSIS — J9690 Respiratory failure, unspecified, unspecified whether with hypoxia or hypercapnia: Secondary | ICD-10-CM

## 2019-07-15 DIAGNOSIS — Z0189 Encounter for other specified special examinations: Secondary | ICD-10-CM

## 2019-07-15 DIAGNOSIS — Z91041 Radiographic dye allergy status: Secondary | ICD-10-CM

## 2019-07-15 DIAGNOSIS — I69354 Hemiplegia and hemiparesis following cerebral infarction affecting left non-dominant side: Secondary | ICD-10-CM | POA: Diagnosis not present

## 2019-07-15 DIAGNOSIS — E785 Hyperlipidemia, unspecified: Secondary | ICD-10-CM | POA: Diagnosis not present

## 2019-07-15 DIAGNOSIS — M7918 Myalgia, other site: Secondary | ICD-10-CM | POA: Diagnosis not present

## 2019-07-15 DIAGNOSIS — R1310 Dysphagia, unspecified: Secondary | ICD-10-CM | POA: Diagnosis not present

## 2019-07-15 DIAGNOSIS — E669 Obesity, unspecified: Secondary | ICD-10-CM | POA: Diagnosis not present

## 2019-07-15 DIAGNOSIS — Z803 Family history of malignant neoplasm of breast: Secondary | ICD-10-CM

## 2019-07-15 DIAGNOSIS — N390 Urinary tract infection, site not specified: Secondary | ICD-10-CM | POA: Diagnosis not present

## 2019-07-15 DIAGNOSIS — IMO0002 Reserved for concepts with insufficient information to code with codable children: Secondary | ICD-10-CM | POA: Diagnosis present

## 2019-07-15 DIAGNOSIS — Z978 Presence of other specified devices: Secondary | ICD-10-CM

## 2019-07-15 DIAGNOSIS — G8320 Monoplegia of upper limb affecting unspecified side: Secondary | ICD-10-CM | POA: Diagnosis not present

## 2019-07-15 DIAGNOSIS — R29715 NIHSS score 15: Secondary | ICD-10-CM | POA: Diagnosis present

## 2019-07-15 DIAGNOSIS — Z8619 Personal history of other infectious and parasitic diseases: Secondary | ICD-10-CM

## 2019-07-15 DIAGNOSIS — Z7952 Long term (current) use of systemic steroids: Secondary | ICD-10-CM

## 2019-07-15 DIAGNOSIS — G936 Cerebral edema: Secondary | ICD-10-CM | POA: Diagnosis present

## 2019-07-15 DIAGNOSIS — Z79891 Long term (current) use of opiate analgesic: Secondary | ICD-10-CM

## 2019-07-15 DIAGNOSIS — E282 Polycystic ovarian syndrome: Secondary | ICD-10-CM | POA: Diagnosis present

## 2019-07-15 DIAGNOSIS — M25561 Pain in right knee: Secondary | ICD-10-CM | POA: Diagnosis not present

## 2019-07-15 DIAGNOSIS — G43909 Migraine, unspecified, not intractable, without status migrainosus: Secondary | ICD-10-CM | POA: Diagnosis present

## 2019-07-15 DIAGNOSIS — J45909 Unspecified asthma, uncomplicated: Secondary | ICD-10-CM | POA: Diagnosis present

## 2019-07-15 DIAGNOSIS — E1169 Type 2 diabetes mellitus with other specified complication: Secondary | ICD-10-CM | POA: Diagnosis not present

## 2019-07-15 DIAGNOSIS — Z888 Allergy status to other drugs, medicaments and biological substances status: Secondary | ICD-10-CM

## 2019-07-15 DIAGNOSIS — K219 Gastro-esophageal reflux disease without esophagitis: Secondary | ICD-10-CM | POA: Diagnosis present

## 2019-07-15 DIAGNOSIS — E1159 Type 2 diabetes mellitus with other circulatory complications: Secondary | ICD-10-CM | POA: Diagnosis not present

## 2019-07-15 DIAGNOSIS — Z833 Family history of diabetes mellitus: Secondary | ICD-10-CM

## 2019-07-15 HISTORY — PX: RADIOLOGY WITH ANESTHESIA: SHX6223

## 2019-07-15 HISTORY — PX: IR CT HEAD LTD: IMG2386

## 2019-07-15 HISTORY — PX: IR PERCUTANEOUS ART THROMBECTOMY/INFUSION INTRACRANIAL INC DIAG ANGIO: IMG6087

## 2019-07-15 HISTORY — PX: IR ANGIO VERTEBRAL SEL SUBCLAVIAN INNOMINATE UNI R MOD SED: IMG5365

## 2019-07-15 HISTORY — PX: IR INTRAVSC STENT CERV CAROTID W/O EMB-PROT MOD SED INC ANGIO: IMG2304

## 2019-07-15 HISTORY — PX: IR ANGIO INTRA EXTRACRAN SEL INTERNAL CAROTID UNI L MOD SED: IMG5361

## 2019-07-15 LAB — PROTIME-INR

## 2019-07-15 LAB — CBC
HCT: 39.9 % (ref 36.0–46.0)
Hemoglobin: 11.9 g/dL — ABNORMAL LOW (ref 12.0–15.0)
MCH: 23.3 pg — ABNORMAL LOW (ref 26.0–34.0)
MCHC: 29.8 g/dL — ABNORMAL LOW (ref 30.0–36.0)
MCV: 78.1 fL — ABNORMAL LOW (ref 80.0–100.0)
Platelets: 315 10*3/uL (ref 150–400)
RBC: 5.11 MIL/uL (ref 3.87–5.11)
RDW: 17 % — ABNORMAL HIGH (ref 11.5–15.5)
WBC: 10.7 10*3/uL — ABNORMAL HIGH (ref 4.0–10.5)
nRBC: 0 % (ref 0.0–0.2)

## 2019-07-15 LAB — DIFFERENTIAL
Abs Immature Granulocytes: 0.04 10*3/uL (ref 0.00–0.07)
Basophils Absolute: 0 10*3/uL (ref 0.0–0.1)
Basophils Relative: 0 %
Eosinophils Absolute: 0 10*3/uL (ref 0.0–0.5)
Eosinophils Relative: 0 %
Immature Granulocytes: 0 %
Lymphocytes Relative: 8 %
Lymphs Abs: 0.9 10*3/uL (ref 0.7–4.0)
Monocytes Absolute: 0.1 10*3/uL (ref 0.1–1.0)
Monocytes Relative: 1 %
Neutro Abs: 9.6 10*3/uL — ABNORMAL HIGH (ref 1.7–7.7)
Neutrophils Relative %: 91 %

## 2019-07-15 LAB — POCT I-STAT 7, (LYTES, BLD GAS, ICA,H+H)
Acid-base deficit: 6 mmol/L — ABNORMAL HIGH (ref 0.0–2.0)
Acid-base deficit: 4 mmol/L — ABNORMAL HIGH (ref 0.0–2.0)
Bicarbonate: 21.9 mmol/L (ref 20.0–28.0)
Bicarbonate: 21.3 mmol/L (ref 20.0–28.0)
Calcium, Ion: 1.17 mmol/L (ref 1.15–1.40)
Calcium, Ion: 1.17 mmol/L (ref 1.15–1.40)
HCT: 35 % — ABNORMAL LOW (ref 36.0–46.0)
HCT: 31 % — ABNORMAL LOW (ref 36.0–46.0)
Hemoglobin: 11.9 g/dL — ABNORMAL LOW (ref 12.0–15.0)
Hemoglobin: 10.5 g/dL — ABNORMAL LOW (ref 12.0–15.0)
O2 Saturation: 94 %
O2 Saturation: 99 %
Potassium: 4 mmol/L (ref 3.5–5.1)
Potassium: 3.9 mmol/L (ref 3.5–5.1)
Sodium: 138 mmol/L (ref 135–145)
Sodium: 135 mmol/L (ref 135–145)
TCO2: 24 mmol/L (ref 22–32)
TCO2: 22 mmol/L (ref 22–32)
pCO2 arterial: 54.2 mmHg — ABNORMAL HIGH (ref 32.0–48.0)
pCO2 arterial: 37 mmHg (ref 32.0–48.0)
pH, Arterial: 7.215 — ABNORMAL LOW (ref 7.350–7.450)
pH, Arterial: 7.369 (ref 7.350–7.450)
pO2, Arterial: 87 mmHg (ref 83.0–108.0)
pO2, Arterial: 123 mmHg — ABNORMAL HIGH (ref 83.0–108.0)

## 2019-07-15 LAB — APTT

## 2019-07-15 LAB — HEMOGLOBIN A1C
Hgb A1c MFr Bld: 8.1 % — ABNORMAL HIGH (ref 4.8–5.6)
Mean Plasma Glucose: 185.77 mg/dL

## 2019-07-15 LAB — COMPREHENSIVE METABOLIC PANEL
ALT: 50 U/L — ABNORMAL HIGH (ref 0–44)
AST: 58 U/L — ABNORMAL HIGH (ref 15–41)
Albumin: 3.8 g/dL (ref 3.5–5.0)
Alkaline Phosphatase: 115 U/L (ref 38–126)
Anion gap: 15 (ref 5–15)
BUN: 16 mg/dL (ref 6–20)
CO2: 20 mmol/L — ABNORMAL LOW (ref 22–32)
Calcium: 9.4 mg/dL (ref 8.9–10.3)
Chloride: 100 mmol/L (ref 98–111)
Creatinine, Ser: 0.75 mg/dL (ref 0.44–1.00)
GFR calc Af Amer: >60 mL/min (ref >60)
GFR calc non Af Amer: >60 mL/min (ref >60)
Glucose, Bld: 235 mg/dL — ABNORMAL HIGH (ref 70–99)
Potassium: 4.7 mmol/L (ref 3.5–5.1)
Sodium: 135 mmol/L (ref 135–145)
Total Bilirubin: 0.7 mg/dL (ref 0.3–1.2)
Total Protein: 7 g/dL (ref 6.5–8.1)

## 2019-07-15 LAB — I-STAT BETA HCG BLOOD, ED (MC, WL, AP ONLY): I-stat hCG, quantitative: <5 m[IU]/mL (ref <5)

## 2019-07-15 LAB — I-STAT CHEM 8, ED
BUN: 21 mg/dL — ABNORMAL HIGH (ref 6–20)
Calcium, Ion: 1.06 mmol/L — ABNORMAL LOW (ref 1.15–1.40)
Chloride: 102 mmol/L (ref 98–111)
Creatinine, Ser: 0.5 mg/dL (ref 0.44–1.00)
Glucose, Bld: 241 mg/dL — ABNORMAL HIGH (ref 70–99)
HCT: 40 % (ref 36.0–46.0)
Hemoglobin: 13.6 g/dL (ref 12.0–15.0)
Potassium: 4.7 mmol/L (ref 3.5–5.1)
Sodium: 133 mmol/L — ABNORMAL LOW (ref 135–145)
TCO2: 24 mmol/L (ref 22–32)

## 2019-07-15 LAB — GLUCOSE, CAPILLARY
Glucose-Capillary: 202 mg/dL — ABNORMAL HIGH (ref 70–99)
Glucose-Capillary: 240 mg/dL — ABNORMAL HIGH (ref 70–99)
Glucose-Capillary: 241 mg/dL — ABNORMAL HIGH (ref 70–99)

## 2019-07-15 LAB — ECHOCARDIOGRAM COMPLETE
Height: 65 in
Weight: 4388.04 oz

## 2019-07-15 LAB — MRSA PCR SCREENING: MRSA by PCR: NEGATIVE

## 2019-07-15 LAB — HIV ANTIBODY (ROUTINE TESTING W REFLEX): HIV Screen 4th Generation wRfx: NONREACTIVE

## 2019-07-15 LAB — SARS CORONAVIRUS 2 BY RT PCR (HOSPITAL ORDER, PERFORMED IN ~~LOC~~ HOSPITAL LAB): SARS Coronavirus 2: NEGATIVE

## 2019-07-15 SURGERY — IR WITH ANESTHESIA
Anesthesia: General

## 2019-07-15 MED ORDER — TICAGRELOR 90 MG PO TABS
90.0000 mg | ORAL_TABLET | Freq: Two times a day (BID) | ORAL | Status: DC
  Administered 2019-07-16 – 2019-07-22 (×12): 90 mg via ORAL
  Filled 2019-07-15 (×12): qty 1

## 2019-07-15 MED ORDER — STROKE: EARLY STAGES OF RECOVERY BOOK
Freq: Once | Status: AC
  Administered 2019-07-15: 20:00:00
  Filled 2019-07-15: qty 1

## 2019-07-15 MED ORDER — LOSARTAN POTASSIUM-HCTZ 100-25 MG PO TABS: 1.0000 | ORAL_TABLET | ORAL | Status: DC

## 2019-07-15 MED ORDER — ONDANSETRON HCL 4 MG/2ML IJ SOLN
4.0000 mg | Freq: Four times a day (QID) | INTRAMUSCULAR | Status: DC | PRN
  Administered 2019-07-18 – 2019-07-20 (×3): 4 mg via INTRAVENOUS
  Filled 2019-07-15 (×4): qty 2

## 2019-07-15 MED ORDER — LIDOCAINE 2% (20 MG/ML) 5 ML SYRINGE
INTRAMUSCULAR | Status: DC | PRN
  Administered 2019-07-15: 60 mg via INTRAVENOUS

## 2019-07-15 MED ORDER — CEFAZOLIN SODIUM-DEXTROSE 2-3 GM-%(50ML) IV SOLR
INTRAVENOUS | Status: DC | PRN
  Administered 2019-07-15: 2 g via INTRAVENOUS

## 2019-07-15 MED ORDER — VALACYCLOVIR HCL 500 MG PO TABS
1000.0000 mg | ORAL_TABLET | Freq: Three times a day (TID) | ORAL | Status: DC
  Administered 2019-07-15 – 2019-07-16 (×2): 1000 mg
  Filled 2019-07-15 (×3): qty 2

## 2019-07-15 MED ORDER — CHLORHEXIDINE GLUCONATE CLOTH 2 % EX PADS
6.0000 | MEDICATED_PAD | Freq: Every day | CUTANEOUS | Status: DC
  Administered 2019-07-15 – 2019-07-22 (×7): 6 via TOPICAL

## 2019-07-15 MED ORDER — PROPOFOL 500 MG/50ML IV EMUL
INTRAVENOUS | Status: DC | PRN
  Administered 2019-07-15: 50 ug/kg/min via INTRAVENOUS

## 2019-07-15 MED ORDER — TIROFIBAN HCL IN NACL 5-0.9 MG/100ML-% IV SOLN
INTRAVENOUS | Status: AC
  Filled 2019-07-15: qty 100

## 2019-07-15 MED ORDER — IPRATROPIUM-ALBUTEROL 0.5-2.5 (3) MG/3ML IN SOLN: 3.0000 mL | Freq: Four times a day (QID) | RESPIRATORY_TRACT | Status: DC | PRN

## 2019-07-15 MED ORDER — LIRAGLUTIDE 18 MG/3ML ~~LOC~~ SOPN: 1.2000 mg | PEN_INJECTOR | Freq: Every day | SUBCUTANEOUS | Status: DC

## 2019-07-15 MED ORDER — ASPIRIN 81 MG PO CHEW
CHEWABLE_TABLET | ORAL | Status: AC
  Filled 2019-07-15: qty 1

## 2019-07-15 MED ORDER — ACETAMINOPHEN 160 MG/5ML PO SOLN
650.0000 mg | ORAL | Status: DC | PRN
  Filled 2019-07-15 (×2): qty 20.3

## 2019-07-15 MED ORDER — ACETAMINOPHEN 160 MG/5ML PO SOLN
650.0000 mg | ORAL | Status: DC | PRN
  Administered 2019-07-16: 650 mg

## 2019-07-15 MED ORDER — CALCIUM GLUCONATE-NACL 1-0.675 GM/50ML-% IV SOLN
1.0000 g | Freq: Once | INTRAVENOUS | Status: AC
  Administered 2019-07-15: 19:00:00 1000 mg via INTRAVENOUS

## 2019-07-15 MED ORDER — FENTANYL CITRATE (PF) 100 MCG/2ML IJ SOLN
INTRAMUSCULAR | Status: AC
  Filled 2019-07-15: qty 2

## 2019-07-15 MED ORDER — SODIUM CHLORIDE 0.9 % IV SOLN: INTRAVENOUS | Status: DC

## 2019-07-15 MED ORDER — BUPROPION HCL 75 MG PO TABS: 75.0000 mg | ORAL_TABLET | Freq: Two times a day (BID) | ORAL | Status: DC

## 2019-07-15 MED ORDER — EPTIFIBATIDE 20 MG/10ML IV SOLN
INTRAVENOUS | Status: AC
  Filled 2019-07-15: qty 10

## 2019-07-15 MED ORDER — IOHEXOL 350 MG/ML SOLN
100.0000 mL | Freq: Once | INTRAVENOUS | Status: AC | PRN
  Administered 2019-07-15: 100 mL via INTRAVENOUS

## 2019-07-15 MED ORDER — CALCIUM GLUCONATE-NACL 1-0.675 GM/50ML-% IV SOLN
INTRAVENOUS | Status: AC
  Administered 2019-07-15: 1000 mg via INTRAVENOUS
  Filled 2019-07-15: qty 50

## 2019-07-15 MED ORDER — SODIUM CHLORIDE 0.9 % IV SOLN
INTRAVENOUS | Status: DC
  Administered 2019-07-15 – 2019-07-17 (×4): via INTRAVENOUS

## 2019-07-15 MED ORDER — ASPIRIN 300 MG RE SUPP: 300.0000 mg | Freq: Every day | RECTAL | Status: DC

## 2019-07-15 MED ORDER — CEFAZOLIN SODIUM-DEXTROSE 2-4 GM/100ML-% IV SOLN
INTRAVENOUS | Status: AC
  Filled 2019-07-15: qty 100

## 2019-07-15 MED ORDER — FENTANYL CITRATE (PF) 100 MCG/2ML IJ SOLN
50.0000 ug | INTRAMUSCULAR | Status: DC | PRN
  Administered 2019-07-15 – 2019-07-16 (×3): 100 ug via INTRAVENOUS
  Filled 2019-07-15 (×2): qty 2

## 2019-07-15 MED ORDER — TICAGRELOR 60 MG PO TABS
ORAL_TABLET | ORAL | Status: AC | PRN
  Administered 2019-07-15: 180 mg via NASOGASTRIC

## 2019-07-15 MED ORDER — NICARDIPINE HCL IN NACL 20-0.86 MG/200ML-% IV SOLN: 0.0000 mg/h | INTRAVENOUS | Status: DC | PRN

## 2019-07-15 MED ORDER — INSULIN ASPART 100 UNIT/ML ~~LOC~~ SOLN
0.0000 [IU] | SUBCUTANEOUS | Status: DC
  Administered 2019-07-15: 7 [IU] via SUBCUTANEOUS
  Administered 2019-07-16 (×2): 4 [IU] via SUBCUTANEOUS
  Administered 2019-07-16 (×2): 7 [IU] via SUBCUTANEOUS
  Administered 2019-07-16: 3 [IU] via SUBCUTANEOUS
  Administered 2019-07-16: 7 [IU] via SUBCUTANEOUS
  Administered 2019-07-17: 3 [IU] via SUBCUTANEOUS
  Administered 2019-07-17: 4 [IU] via SUBCUTANEOUS
  Administered 2019-07-17: 3 [IU] via SUBCUTANEOUS
  Administered 2019-07-17: 4 [IU] via SUBCUTANEOUS
  Administered 2019-07-17 (×3): 3 [IU] via SUBCUTANEOUS
  Administered 2019-07-18: 13:00:00 4 [IU] via SUBCUTANEOUS
  Administered 2019-07-18 (×2): 3 [IU] via SUBCUTANEOUS
  Administered 2019-07-18 (×2): 4 [IU] via SUBCUTANEOUS
  Administered 2019-07-19 (×4): 3 [IU] via SUBCUTANEOUS
  Administered 2019-07-19: 04:00:00 4 [IU] via SUBCUTANEOUS

## 2019-07-15 MED ORDER — FENTANYL CITRATE (PF) 250 MCG/5ML IJ SOLN
INTRAMUSCULAR | Status: DC | PRN
  Administered 2019-07-15: 100 ug via INTRAVENOUS

## 2019-07-15 MED ORDER — ASPIRIN 81 MG PO CHEW
81.0000 mg | CHEWABLE_TABLET | Freq: Every day | ORAL | Status: DC
  Administered 2019-07-16: 81 mg
  Filled 2019-07-15 (×2): qty 1

## 2019-07-15 MED ORDER — CLOPIDOGREL BISULFATE 300 MG PO TABS
ORAL_TABLET | ORAL | Status: AC
  Filled 2019-07-15: qty 1

## 2019-07-15 MED ORDER — TRIAMTERENE-HCTZ 37.5-25 MG PO TABS
1.0000 | ORAL_TABLET | Freq: Every day | ORAL | Status: DC
  Filled 2019-07-15: qty 1

## 2019-07-15 MED ORDER — STUDY - TIMELESS - TENECTEPLASE OR PLACEBO IV BOLUS (PI-SETHI)
25.0000 mg | Freq: Once | INTRAVENOUS | Status: AC
  Administered 2019-07-15: 25 mg via INTRAVENOUS
  Filled 2019-07-15: qty 5

## 2019-07-15 MED ORDER — PROPOFOL 10 MG/ML IV BOLUS
INTRAVENOUS | Status: DC | PRN
  Administered 2019-07-15: 100 mg via INTRAVENOUS

## 2019-07-15 MED ORDER — ACETAMINOPHEN 650 MG RE SUPP: 650.0000 mg | RECTAL | Status: DC | PRN

## 2019-07-15 MED ORDER — SENNOSIDES-DOCUSATE SODIUM 8.6-50 MG PO TABS
1.0000 | ORAL_TABLET | Freq: Once | ORAL | Status: DC
  Filled 2019-07-15: qty 1

## 2019-07-15 MED ORDER — VALACYCLOVIR HCL 500 MG PO TABS: 1000.0000 mg | ORAL_TABLET | Freq: Three times a day (TID) | ORAL | Status: DC

## 2019-07-15 MED ORDER — METHYLPREDNISOLONE SODIUM SUCC 125 MG IJ SOLR
INTRAMUSCULAR | Status: AC
  Administered 2019-07-15: 125 mg
  Filled 2019-07-15: qty 2

## 2019-07-15 MED ORDER — DIPHENHYDRAMINE HCL 50 MG/ML IJ SOLN
INTRAMUSCULAR | Status: AC
  Administered 2019-07-15: 50 mg
  Filled 2019-07-15: qty 1

## 2019-07-15 MED ORDER — SODIUM CHLORIDE 0.9 % IV SOLN: 50.0000 mL/h | INTRAVENOUS | Status: DC

## 2019-07-15 MED ORDER — ROCURONIUM BROMIDE 10 MG/ML (PF) SYRINGE
PREFILLED_SYRINGE | INTRAVENOUS | Status: DC | PRN
  Administered 2019-07-15: 30 mg via INTRAVENOUS
  Administered 2019-07-15: 60 mg via INTRAVENOUS
  Administered 2019-07-15: 40 mg via INTRAVENOUS
  Administered 2019-07-15: 30 mg via INTRAVENOUS
  Administered 2019-07-15 (×2): 40 mg via INTRAVENOUS
  Administered 2019-07-15: 20 mg via INTRAVENOUS

## 2019-07-15 MED ORDER — ACETAMINOPHEN 325 MG PO TABS: 650.0000 mg | ORAL_TABLET | ORAL | Status: DC | PRN

## 2019-07-15 MED ORDER — CHLORHEXIDINE GLUCONATE 0.12% ORAL RINSE (MEDLINE KIT)
15.0000 mL | Freq: Two times a day (BID) | OROMUCOSAL | Status: DC
  Administered 2019-07-15 – 2019-07-16 (×2): 15 mL via OROMUCOSAL

## 2019-07-15 MED ORDER — PHENYLEPHRINE 40 MCG/ML (10ML) SYRINGE FOR IV PUSH (FOR BLOOD PRESSURE SUPPORT)
PREFILLED_SYRINGE | INTRAVENOUS | Status: DC | PRN
  Administered 2019-07-15 (×2): 80 ug via INTRAVENOUS

## 2019-07-15 MED ORDER — PANTOPRAZOLE SODIUM 40 MG IV SOLR
40.0000 mg | Freq: Every day | INTRAVENOUS | Status: DC
  Administered 2019-07-15 – 2019-07-17 (×3): 40 mg via INTRAVENOUS
  Filled 2019-07-15 (×3): qty 40

## 2019-07-15 MED ORDER — PROPOFOL 1000 MG/100ML IV EMUL
0.0000 ug/kg/min | INTRAVENOUS | Status: DC
  Administered 2019-07-15 – 2019-07-16 (×4): 50 ug/kg/min via INTRAVENOUS
  Administered 2019-07-16: 45 ug/kg/min via INTRAVENOUS
  Administered 2019-07-16: 50 ug/kg/min via INTRAVENOUS
  Filled 2019-07-15 (×4): qty 100

## 2019-07-15 MED ORDER — TICAGRELOR 90 MG PO TABS
90.0000 mg | ORAL_TABLET | Freq: Two times a day (BID) | ORAL | Status: DC
  Administered 2019-07-15 – 2019-07-16 (×2): 90 mg
  Filled 2019-07-15 (×3): qty 1

## 2019-07-15 MED ORDER — FENTANYL CITRATE (PF) 100 MCG/2ML IJ SOLN: 50.0000 ug | INTRAMUSCULAR | Status: DC | PRN

## 2019-07-15 MED ORDER — SUCCINYLCHOLINE CHLORIDE 200 MG/10ML IV SOSY
PREFILLED_SYRINGE | INTRAVENOUS | Status: DC | PRN
  Administered 2019-07-15: 160 mg via INTRAVENOUS

## 2019-07-15 MED ORDER — SODIUM CHLORIDE 0.9% FLUSH: 3.0000 mL | Freq: Once | INTRAVENOUS | Status: AC

## 2019-07-15 MED ORDER — NITROGLYCERIN 1 MG/10 ML FOR IR/CATH LAB
INTRA_ARTERIAL | Status: AC
  Filled 2019-07-15: qty 10

## 2019-07-15 MED ORDER — ASPIRIN 81 MG PO CHEW: 81.0000 mg | CHEWABLE_TABLET | Freq: Every day | ORAL | Status: DC

## 2019-07-15 MED ORDER — ASPIRIN 81 MG PO CHEW
CHEWABLE_TABLET | ORAL | Status: AC | PRN
  Administered 2019-07-15: 81 mg via NASOGASTRIC

## 2019-07-15 MED ORDER — IOHEXOL 300 MG/ML  SOLN
150.0000 mL | Freq: Once | INTRAMUSCULAR | Status: AC | PRN
  Administered 2019-07-15: 50 mL via INTRA_ARTERIAL

## 2019-07-15 MED ORDER — IOHEXOL 300 MG/ML  SOLN
150.0000 mL | Freq: Once | INTRAMUSCULAR | Status: AC | PRN
  Administered 2019-07-15: 16:00:00 100 mL via INTRA_ARTERIAL

## 2019-07-15 MED ORDER — EPTIFIBATIDE 20 MG/10ML IV SOLN
INTRAVENOUS | Status: AC | PRN
  Administered 2019-07-15 (×4): 1.5 ug via INTRAVENOUS
  Administered 2019-07-15: 1.5 mg via INTRAVENOUS

## 2019-07-15 MED ORDER — TICAGRELOR 90 MG PO TABS
ORAL_TABLET | ORAL | Status: AC
  Filled 2019-07-15: qty 2

## 2019-07-15 MED ORDER — PROPOFOL 1000 MG/100ML IV EMUL
INTRAVENOUS | Status: AC
  Filled 2019-07-15: qty 100

## 2019-07-15 MED ORDER — METFORMIN HCL 500 MG PO TABS: 500.0000 mg | ORAL_TABLET | Freq: Two times a day (BID) | ORAL | Status: DC

## 2019-07-15 MED ORDER — ASPIRIN 325 MG PO TABS: 325.0000 mg | ORAL_TABLET | Freq: Every day | ORAL | Status: DC

## 2019-07-15 MED ORDER — CLEVIDIPINE BUTYRATE 0.5 MG/ML IV EMUL: 0.0000 mg/h | INTRAVENOUS | Status: AC

## 2019-07-15 MED ORDER — PHENYLEPHRINE HCL-NACL 10-0.9 MG/250ML-% IV SOLN
INTRAVENOUS | Status: DC | PRN
  Administered 2019-07-15: 25 ug/min via INTRAVENOUS
  Administered 2019-07-15: 15:00:00 via INTRAVENOUS

## 2019-07-15 MED ORDER — LABETALOL HCL 5 MG/ML IV SOLN: 10.0000 mg | Freq: Once | INTRAVENOUS | Status: DC | PRN

## 2019-07-15 MED ORDER — ACETAMINOPHEN 325 MG PO TABS
650.0000 mg | ORAL_TABLET | ORAL | Status: DC | PRN
  Administered 2019-07-17 – 2019-07-21 (×4): 650 mg via ORAL
  Filled 2019-07-15 (×4): qty 2

## 2019-07-15 MED ORDER — ORAL CARE MOUTH RINSE
15.0000 mL | OROMUCOSAL | Status: DC
  Administered 2019-07-15 – 2019-07-16 (×9): 15 mL via OROMUCOSAL

## 2019-07-15 NOTE — Progress Notes (Signed)
Patient ID: Kathryn Coffey, female   DOB: 1971/07/20, 48 y.o.   MRN: [id number] INR. 38 Y RT H F MRSS 0 LSW 500am. New onset Lt sided weakness NIHSS 15 . CT brain NO ICH ASPECTS 9 CTA  Occluded RT ICA ,probable dissection,and RT M1 occlusion. CTP mapa core of 11cc  Versus penumbra of 142 ml . Endovascular option D/W spouse. Reasons,,procedure,alternatives discussed. Risks of ICH of 10 %,worsening neurodeficit ,death ,inability revascularize and vascular injury reviewed. Spouse expressed understanding and provided consent to proceed. S.Ennifer Harston MD

## 2019-07-15 NOTE — Procedures (Signed)
S/P RT common carotid arteriogram. RT CFA approach. S/P revascularization of Rt MCA M 1 with x 1 pass with the 53mm x 38mm embotrap retriver device and  Penumbra aspiration achieving a TICI 3 revascularization.  Repair of RT ICA prox  occlusive dissection with  Flow diverter device. S.Kasai Beltran MD

## 2019-07-15 NOTE — ED Provider Notes (Signed)
[facility] EMERGENCY DEPARTMENT Provider Note   CSN: [id number] Arrival date & time: 07/15/19  1158     History   Chief Complaint Chief Complaint  Patient presents with   Code Stroke   Level 5 caveat due to altered mental status. HPI TIONNI BAAB is a 48 y.o. female with history of diabetes mellitus, GERD, obesity, hypertension presents today brought in by EMS as a code stroke.  History is limited due to the condition of the patient.  Reportedly she had a fall at 10 AM and was assessed by EMS but refused transport to the hospital.  She did not seek medical evaluation at that time.  EMS was subsequently called out after the patient developed left-sided weakness.  On exam she exhibits rightward deviation of gaze, flaccid paralysis of the left side.  A&O x4.  Found to be LVO positive on EMS assessment.      The history is provided by the EMS personnel and the patient.    Past Medical History:  Diagnosis Date   Abdominal wall cellulitis 2013 and 2014   Asthma    allergic to grasses   B12 deficiency    Complication of anesthesia    Costochondritis    Diabetes mellitus without complication (HCC)    Gastric anomaly    multiple small ulcers   GERD (gastroesophageal reflux disease)    Herpes 06/2018   POSSIBLY IN EYE- PT TAKING VALTREX AND WILL SEE OPTHAMOLOGIST TO SEE IF VALTREX IS WORKING   History of abnormal cervical Pap smear    History of Clostridium difficile colitis    History of hiatal hernia    History of kidney stones    Hypertension    IBS (irritable bowel syndrome)    Migraine    Morbid obesity ([place])    s/p attempted gastric banding now decompressed   PCOS (polycystic ovarian syndrome)    PONV (postoperative nausea and vomiting)    WITH SPINAL ONLY    Patient Active Problem List   Diagnosis Date Noted   Controlled type 2 diabetes mellitus without complication, without long-term current use of insulin ([place]) 05/28/2017    Attention deficit hyperactivity disorder (ADHD), combined type 04/18/2017   Morbid obesity ([place]) 123456   Umbilical hernia 99991111   Hiatal hernia 12/08/2015   History of Clostridium difficile colitis 06/19/2014   HTN (hypertension) 12/23/2013   PCOS (polycystic ovarian syndrome) 12/23/2013   Bariatric surgery status 01/28/2013    Past Surgical History:  Procedure Laterality Date   BREAST EXCISIONAL BIOPSY Left    cyst excision - Dr. Patty Sermons   CESAREAN SECTION     CHOLECYSTECTOMY     COLONOSCOPY     EAR CYST EXCISION Right 07/04/2018   Procedure: EXCISION GLOMUS TUMOR THUMBNAIL;  Surgeon: Corky Mull, MD;  Location: [facility] ORS;  Service: Orthopedics;  Laterality: Right;   ESOPHAGOGASTRODUODENOSCOPY     ESOPHAGOGASTRODUODENOSCOPY (EGD) WITH PROPOFOL N/A 12/06/2015   Procedure: ESOPHAGOGASTRODUODENOSCOPY (EGD) WITH PROPOFOL;  Surgeon: Manya Silvas, MD;  Location[facility] ENDOSCOPY;  Service: Endoscopy;  Laterality: N/A;   LAPAROSCOPIC GASTRIC BANDING     LAPAROSCOPIC GASTRIC RESTRICTIVE DUODENAL PROCEDURE (DUODENAL SWITCH) N/A 01/25/2016   Procedure: LAPAROSCOPIC GASTRIC RESTRICTIVE DUODENAL PROCEDURE (DUODENAL SWITCH);  Surgeon: Ladora Daniel, MD;  Location: [facility] ORS;  Service: General;  Laterality: N/A;   OVARY SURGERY     x2   TONSILLECTOMY     TUBAL LIGATION     UMBILICAL HERNIA REPAIR N/A 01/25/2016  Procedure: LAPAROSCOPIC UMBILICAL HERNIA;  Surgeon: Ladora Daniel, MD;  Location: [facility] ORS;  Service: General;  Laterality: N/A;   UMBILICAL HERNIA REPAIR N/A 10/20/2016   Procedure: HERNIA REPAIR UMBILICAL ADULT;  Surgeon: Leonie Green, MD;  Location: [facility] ORS;  Service: General;  Laterality: N/A;     OB History   No obstetric history on file.      Home Medications    Prior to Admission medications   Medication Sig Start Date End Date Taking? Authorizing Provider  albuterol (PROVENTIL HFA;VENTOLIN HFA) 108 (90 Base) MCG/ACT inhaler  Inhale 2 puffs into the lungs every 6 (six) hours as needed for wheezing or shortness of breath.    [provider]  amphetamine-dextroamphetamine (ADDERALL) 10 MG tablet Take 10 mg by mouth See admin instructions. Take 10 mg by mouth daily Monday through Friday. 05/28/17   [provider]  buPROPion (WELLBUTRIN) 75 MG tablet Take 75 mg by mouth 2 (two) times daily.    [provider]  Calcium Carb-Cholecalciferol (CALCIUM + VITAMIN D3 PO) Take 1 tablet by mouth daily.    [provider]  fexofenadine (ALLEGRA) 180 MG tablet Take 180 mg by mouth daily as needed for allergies.     [provider]  fluticasone (FLONASE) 50 MCG/ACT nasal spray Place 2 sprays into both nostrils daily as needed for allergies or rhinitis.     [provider]  levonorgestrel (MIRENA) 20 MCG/24HR IUD 1 each by Intrauterine route once.    [provider]  liraglutide (VICTOZA) 18 MG/3ML SOPN Inject 1.2 mg into the skin daily.    [provider]  losartan-hydrochlorothiazide (HYZAAR) 100-25 MG tablet Take 1 tablet by mouth every morning.     [provider]  metFORMIN (GLUCOPHAGE) 500 MG tablet Take 500 mg by mouth 2 (two) times daily. 06/20/18   [provider]  Multiple Vitamins-Minerals (MULTIVITAMIN PO) Take 2 tablets by mouth daily.    [provider]  neomycin-polymyxin b-dexamethasone (MAXITROL) 3.5-10000-0.1 SUSP Place 1 drop into the right eye 3 (three) times daily.    [provider]  NON FORMULARY 1 Dose once a week.    [provider]  traMADol (ULTRAM) 50 MG tablet Take 1 tablet (50 mg total) by mouth every 6 (six) hours as needed for moderate pain. 07/04/18   Poggi, Marshall Cork, MD  triamterene-hydrochlorothiazide (DYAZIDE) 37.5-25 MG capsule Take 1 capsule by mouth every morning.     [provider]  valACYclovir (VALTREX) 1000 MG tablet Take 1,000 mg by mouth 3 (three) times daily.    [provider]    Family History Family History  Problem Relation Age of Onset   Breast cancer Maternal Grandmother 54   Diabetes Mother    Diabetes Father     Social History Social History   Tobacco Use   Smoking status: Never Smoker   Smokeless tobacco: Never Used  Substance Use Topics   Alcohol use: Yes    Alcohol/week: 0.0 standard drinks    Comment: SOCIAL   Drug use: No     Allergies   Azithromycin, Ivp dye [iodinated diagnostic agents], Lexapro [escitalopram], and Vicodin [hydrocodone-acetaminophen]   Review of Systems Review of Systems  Unable to perform ROS: Mental status change     Physical Exam Updated Vital Signs There were no vitals taken for this visit.  Physical Exam Vitals signs and nursing note reviewed.  Constitutional:      General: She is not in acute distress.  Appearance: She is well-developed.  HENT:     Head: Normocephalic and atraumatic.  Eyes:     General:        Right eye: No discharge.        Left eye: No discharge.     Conjunctiva/sclera: Conjunctivae normal.  Neck:     Vascular: No JVD.     Trachea: No tracheal deviation.  Cardiovascular:     Rate and Rhythm: Normal rate.  Pulmonary:     Effort: Pulmonary effort is normal.  Abdominal:     General: There is no distension.  Skin:    General: Skin is warm and dry.     Findings: No erythema.  Neurological:     Mental Status: She is alert.     Comments: Oriented to person place and time.  Exhibits rightward gaze, flaccid paralysis of the left side.  Exhibits neglect on the left side with peripheral visual field deficit.  Psychiatric:        Behavior: Behavior normal.      ED Treatments / Results  Labs (all labs ordered are listed, but only abnormal results are displayed) Labs Reviewed  I-STAT CHEM 8, ED - Abnormal; Notable for the following components:      Result Value   Sodium 133 (*)    BUN 21 (*)    Glucose, Bld 241 (*)    Calcium, Ion 1.06 (*)     All other components within normal limits  SARS CORONAVIRUS 2 BY RT PCR (HOSPITAL ORDER, [facility])  PROTIME-INR  APTT  CBC  DIFFERENTIAL  COMPREHENSIVE METABOLIC PANEL  I-STAT BETA HCG BLOOD, ED ([facility], [facility], [facility] ONLY)  CBG MONITORING, ED  POC SARS CORONAVIRUS 2 AG -  ED    EKG None  Radiology Ct Head Code Stroke Wo Contrast  Result Date: 07/15/2019 CLINICAL DATA:  Code stroke. Possible stroke. Small focal neuro deficit for less than 6 hours. Last known well at 5 a.m. Fall today. EXAM: CT HEAD WITHOUT CONTRAST TECHNIQUE: Contiguous axial images were obtained from the base of the skull through the vertex without intravenous contrast. COMPARISON:  CT head without contrast 07/15/2019 FINDINGS: Brain: The right lentiform nucleus is asymmetrically hypo dense compared to the right. Insular ribbon is preserved. Basal ganglia are otherwise normal. No other focal cortical lesions are present. The brainstem and cerebellum are within normal limits. Vascular: Hyperdense right MCA is present with extension into the M2 branches. No significant vascular calcifications are present. Calvarium is intact. No focal lytic or blastic lesions are present. Skull: Calvarium is intact. No focal lytic or blastic lesions are present. Sinuses/Orbits: The paranasal sinuses and mastoid air cells are clear. The globes and orbits are within normal limits. ASPECTS [facility] Stroke Program Early CT Score) - Ganglionic level infarction (caudate, lentiform nuclei, internal capsule, insula, M1-M3 cortex): 6/7 - Supraganglionic infarction (M4-M6 cortex): 3/3 Total score (0-10 with 10 being normal): 9/10 IMPRESSION: 1. Acute non hemorrhagic infarct involving the right basal ganglia with hypoattenuation of the right lentiform nucleus. 2. Hyperdense right MCA suggesting thrombus. 3. ASPECTS is 9/10 These results were called by telephone at the time of interpretation on 07/15/2019 at 12:24 pm to provider Roland Rack, who verbally acknowledged these results. Electronically Signed   By: San Morelle M.D.   On: 07/15/2019 12:26    Procedures .Critical Care Performed by: Renita Papa, PA-C Authorized by: Renita Papa, PA-C   Critical care provider statement:    Critical  care time (minutes):  30   Critical care was necessary to treat or prevent imminent or life-threatening deterioration of the following conditions:  CNS failure or compromise   Critical care was time spent personally by me on the following activities:  Discussions with consultants, evaluation of patient's response to treatment, examination of patient, ordering and performing treatments and interventions, ordering and review of laboratory studies, ordering and review of radiographic studies, pulse oximetry, re-evaluation of patient's condition, obtaining history from patient or surrogate and review of old charts   I assumed direction of critical care for this patient from another provider in my specialty: no     (including critical care time)  Medications Ordered in ED Medications  sodium chloride flush (NS) 0.9 % injection 3 mL (has no administration in time range)  tirofiban (AGGRASTAT) 5-0.9 MG/100ML-% injection (has no administration in time range)  aspirin 81 MG chewable tablet (has no administration in time range)  ticagrelor (BRILINTA) 90 MG tablet (has no administration in time range)  clopidogrel (PLAVIX) 300 MG tablet (has no administration in time range)  eptifibatide (INTEGRILIN) 20 MG/10ML injection (has no administration in time range)  ceFAZolin (ANCEF) 2-4 GM/100ML-% IVPB (has no administration in time range)  nitroGLYCERIN 100 mcg/mL intra-arterial injection (has no administration in time range)  fentaNYL (SUBLIMAZE) 100 MCG/2ML injection (has no administration in time range)  diphenhydrAMINE (BENADRYL) 50 MG/ML injection (50 mg  Given 07/15/19 1216)  methylPREDNISolone sodium succinate (SOLU-MEDROL)  125 mg/2 mL injection (125 mg  Given 07/15/19 1217)  iohexol (OMNIPAQUE) 350 MG/ML injection 100 mL (100 mLs Intravenous Contrast Given 07/15/19 1222)     Initial Impression / Assessment and Plan / ED Course  I have reviewed the triage vital signs and the nursing notes.  Pertinent labs & imaging results that were available during my care of the patient were reviewed by me and considered in my medical decision making (see chart for details).        Patient presents brought in by EMS as a code stroke with significant left-sided deficits, rightward gaze.  She is LVO positive.  Last known normal at 5 AM when she went to the bathroom.  Noncontrast head CT shows acute nonhemorrhagic infarct involving the right basal ganglia with hypoattenuation of the right lentiform nucleus and a hyperdense right MCA suggesting thrombus.  She is within the window for thrombectomy.  Patient seen and evaluated by Dr. Leonel Ramsay with neurology and remainder of stroke team.  Plan for IR intervention.  Rapid Covid test ordered.   Final Clinical Impressions(s) / ED Diagnoses   Final diagnoses:  Stroke [facility])    ED Discharge Orders    None       Renita Papa, PA-C 07/15/19 [place], [place], DO 07/15/19 1551

## 2019-07-15 NOTE — H&P (Signed)
Neurology H&P    CC: Code stroke with left plegia  History is obtained from: Son  HPI: Kathryn Coffey is a 48 y.o. female  Patien was last seen normal 5 AM when she went to the bathroom.  When she went to the bathroom at 10 She fell and refused to go to hospital.  There after the fall she was complaining of significant headache.  Later patient was noted to be flaccid on the left upper and lower extremity .  At this time ambulance called back. code stroke called. In route BP 132/95, CBG 141, nausea but no vomiting. On arrival patient alert and oriented. But showing significant left sided deficits. CT head along with CTA head and neck/perfusion obtained.   ED course as above  LKW: 0500 07/15/2019 tpa given?: no, out of window Premorbid modified Rankin scale (mRS): 0 NIHSS: 15  Past Medical History:  Diagnosis Date  . Abdominal wall cellulitis 2013 and 2014  . Asthma    allergic to grasses  . B12 deficiency   . Complication of anesthesia   . Costochondritis   . Diabetes mellitus without complication ([place])   . Gastric anomaly    multiple small ulcers  . GERD (gastroesophageal reflux disease)   . Herpes 06/2018   POSSIBLY IN EYE- PT TAKING VALTREX AND WILL SEE OPTHAMOLOGIST TO SEE IF VALTREX IS WORKING  . History of abnormal cervical Pap smear   . History of Clostridium difficile colitis   . History of hiatal hernia   . History of kidney stones   . Hypertension   . IBS (irritable bowel syndrome)   . Migraine   . Morbid obesity ([place])    s/p attempted gastric banding now decompressed  . PCOS (polycystic ovarian syndrome)   . PONV (postoperative nausea and vomiting)    WITH SPINAL ONLY     Family History  Problem Relation Age of Onset  . Breast cancer Maternal Grandmother 48  . Diabetes Mother   . Diabetes Father    Social History:   reports that she has never smoked. She has never used smokeless tobacco. She reports current alcohol use. She reports that she does not use  drugs.  Medications  Current Facility-Administered Medications:  .   stroke: mapping our early stages of recovery book, , Does not apply, Once, Greta Doom, MD .  0.9 %  sodium chloride infusion, , Intravenous, Continuous, Leonel Ramsay, Vida Roller, MD .  acetaminophen (TYLENOL) tablet 650 mg, 650 mg, Oral, Q4H PRN **OR** acetaminophen (TYLENOL) 160 MG/5ML solution 650 mg, 650 mg, Per Tube, Q4H PRN **OR** acetaminophen (TYLENOL) suppository 650 mg, 650 mg, Rectal, Q4H PRN, Greta Doom, MD .  aspirin 81 MG chewable tablet, , , ,  .  aspirin suppository 300 mg, 300 mg, Rectal, Daily **OR** aspirin tablet 325 mg, 325 mg, Oral, Daily, Roshanda Balazs, Vida Roller, MD .  ceFAZolin (ANCEF) 2-4 GM/100ML-% IVPB, , , ,  .  clopidogrel (PLAVIX) 300 MG tablet, , , ,  .  eptifibatide (INTEGRILIN) 20 MG/10ML injection, , , ,  .  iohexol (OMNIPAQUE) 300 MG/ML solution 150 mL, 150 mL, Intra-arterial, Once PRN, Deveshwar, Sanjeev, MD .  nitroGLYCERIN 100 mcg/mL intra-arterial injection, , , ,  .  senna-docusate (Senokot-S) tablet 1 tablet, 1 tablet, Oral, Once, Roland Rack P, MD .  sodium chloride flush (NS) 0.9 % injection 3 mL, 3 mL, Intravenous, Once, Curatolo, Adam, DO .  ticagrelor (BRILINTA) 90 MG tablet, , , ,  .  tirofiban (AGGRASTAT) 5-0.9 MG/100ML-% injection, , , ,   Current Outpatient Medications:  .  albuterol (PROVENTIL HFA;VENTOLIN HFA) 108 (90 Base) MCG/ACT inhaler, Inhale 2 puffs into the lungs every 6 (six) hours as needed for wheezing or shortness of breath., Disp: , Rfl:  .  amphetamine-dextroamphetamine (ADDERALL) 10 MG tablet, Take 10 mg by mouth See admin instructions. Take 10 mg by mouth daily Monday through Friday., Disp: , Rfl:  .  buPROPion (WELLBUTRIN) 75 MG tablet, Take 75 mg by mouth 2 (two) times daily., Disp: , Rfl:  .  Calcium Carb-Cholecalciferol (CALCIUM + VITAMIN D3 PO), Take 1 tablet by mouth daily., Disp: , Rfl:  .  fexofenadine (ALLEGRA) 180 MG  tablet, Take 180 mg by mouth daily as needed for allergies. , Disp: , Rfl:  .  fluticasone (FLONASE) 50 MCG/ACT nasal spray, Place 2 sprays into both nostrils daily as needed for allergies or rhinitis. , Disp: , Rfl:  .  levonorgestrel (MIRENA) 20 MCG/24HR IUD, 1 each by Intrauterine route once., Disp: , Rfl:  .  liraglutide (VICTOZA) 18 MG/3ML SOPN, Inject 1.2 mg into the skin daily., Disp: , Rfl:  .  losartan-hydrochlorothiazide (HYZAAR) 100-25 MG tablet, Take 1 tablet by mouth every morning. , Disp: , Rfl:  .  metFORMIN (GLUCOPHAGE) 500 MG tablet, Take 500 mg by mouth 2 (two) times daily., Disp: , Rfl: 11 .  Multiple Vitamins-Minerals (MULTIVITAMIN PO), Take 2 tablets by mouth daily., Disp: , Rfl:  .  neomycin-polymyxin b-dexamethasone (MAXITROL) 3.5-10000-0.1 SUSP, Place 1 drop into the right eye 3 (three) times daily., Disp: , Rfl:  .  NON FORMULARY, 1 Dose once a week., Disp: , Rfl:  .  traMADol (ULTRAM) 50 MG tablet, Take 1 tablet (50 mg total) by mouth every 6 (six) hours as needed for moderate pain., Disp: 20 tablet, Rfl: 0 .  triamterene-hydrochlorothiazide (DYAZIDE) 37.5-25 MG capsule, Take 1 capsule by mouth every morning. , Disp: , Rfl:  .  valACYclovir (VALTREX) 1000 MG tablet, Take 1,000 mg by mouth 3 (three) times daily., Disp: , Rfl:   Facility-Administered Medications Ordered in Other Encounters:  .  ceFAZolin (ANCEF) IVPB 2 g/50 mL premix, , Intravenous, Anesthesia Intra-op, Hessie Dibble T, CRNA, 2 g at 07/15/19 1300 .  fentaNYL (SUBLIMAZE) injection, , Intravenous, Anesthesia Intra-op, Hessie Dibble T, CRNA, 100 mcg at 07/15/19 1251 .  lidocaine 2% (20 mg/mL) 5 mL syringe, , Intravenous, Anesthesia Intra-op, Hessie Dibble T, CRNA, 60 mg at 07/15/19 1251 .  phenylephrine (NEOSYNEPHRINE) 10-0.9 MG/250ML-% infusion, , , Continuous PRN, Hessie Dibble T, CRNA, Last Rate: 60 mL/hr at 07/15/19 1340, 40 mcg/min at 07/15/19 1340 .  propofol (DIPRIVAN) 10 mg/mL bolus/IV push, , ,  Anesthesia Intra-op, Hessie Dibble T, CRNA, 100 mg at 07/15/19 1251 .  rocuronium bromide 10 mg/mL (PF) syringe, , Intravenous, Anesthesia Intra-op, Hessie Dibble T, CRNA, 40 mg at 07/15/19 1321 .  succinylcholine (ANECTINE) syringe, , Intravenous, Anesthesia Intra-op, Hessie Dibble T, CRNA, 160 mg at 07/15/19 1251   Exam: Current vital signs: Ht 5\' 5"  (1.651 m)   Wt 122.5 kg   BMI 44.94 kg/m  Vital signs in last 24 hours: Weight:  [122.5 kg] 122.5 kg (11/24 1252)  ROS: Unable to obtain secondary to mentation    Physical Exam   Constitutional: Appears well-developed and well-nourished.  Psych: Affect appropriate to situation Eyes: No scleral injection HENT: No OP obstrucion Head: Normocephalic.  Cardiovascular: Normal rate and regular rhythm.  Respiratory: Effort normal, non-labored breathing GI:  Soft.  No distension. There is no tenderness.  Skin: WDI  Neuro: Mental Status: Patient is awake, alert, oriented to person, place, month, year Patient is unble to give a clear and coherent history. No signs of aphasia Cranial Nerves: II: left hemianopsia  III,IV, VI: able to cross midline to left but not all the wasy--right gaze preference. Pupils equal, round and reactive to light V: Facial sensation is decreased on the left VII: Facial movement with left facial weakness VIII: hearing is intact to voice X: Palat elevates symmetrically XI: Shoulder shrug is symmetric. XII: tongue is midline without atrophy or fasciculations.  Motor: Tone is normal. Bulk is normal. 5/5 strength was present on the right, 2/5 in the left arm and 0/5 in the left leg Sensory: Sensation is diminished on the left Cerebellar: FNF and HKS are intact on the right  Labs I have reviewed labs in epic and the results pertinent to this consultation are:   CBC    Component Value Date/Time   WBC 10.7 (H) 07/15/2019 1225   RBC 5.11 07/15/2019 1225   HGB 13.6 07/15/2019 1233   HGB 12.9 09/29/2012  0536   HCT 40.0 07/15/2019 1233   HCT 38.7 09/29/2012 0536   PLT 315 07/15/2019 1225   PLT 284 09/29/2012 0536   MCV 78.1 (L) 07/15/2019 1225   MCV 92 09/29/2012 0536   MCH 23.3 (L) 07/15/2019 1225   MCHC 29.8 (L) 07/15/2019 1225   RDW 17.0 (H) 07/15/2019 1225   RDW 13.1 09/29/2012 0536   LYMPHSABS 0.9 07/15/2019 1225   LYMPHSABS 2.0 09/29/2012 0536   MONOABS 0.1 07/15/2019 1225   MONOABS 0.5 09/29/2012 0536   EOSABS 0.0 07/15/2019 1225   EOSABS 0.2 09/29/2012 0536   BASOSABS 0.0 07/15/2019 1225   BASOSABS 0.1 09/29/2012 0536    CMP     Component Value Date/Time   NA 133 (L) 07/15/2019 1233   NA 140 09/29/2012 0536   K 4.7 07/15/2019 1233   K 3.9 09/29/2012 0536   CL 102 07/15/2019 1233   CL 109 (H) 09/29/2012 0536   CO2 20 (L) 07/15/2019 1225   CO2 25 09/29/2012 0536   GLUCOSE 241 (H) 07/15/2019 1233   GLUCOSE 105 (H) 09/29/2012 0536   BUN 21 (H) 07/15/2019 1233   BUN 11 09/29/2012 0536   CREATININE 0.50 07/15/2019 1233   CREATININE 0.71 09/29/2012 0536   CALCIUM 9.4 07/15/2019 1225   CALCIUM 8.4 (L) 09/29/2012 0536   PROT 7.0 07/15/2019 1225   PROT 7.6 04/17/2012 2156   ALBUMIN 3.8 07/15/2019 1225   ALBUMIN 3.5 04/17/2012 2156   AST 58 (H) 07/15/2019 1225   AST 28 04/17/2012 2156   ALT 50 (H) 07/15/2019 1225   ALT 30 04/17/2012 2156   ALKPHOS 115 07/15/2019 1225   ALKPHOS 109 04/17/2012 2156   BILITOT 0.7 07/15/2019 1225   BILITOT 0.3 04/17/2012 2156   GFRNONAA >60 07/15/2019 1225   GFRNONAA >60 09/29/2012 0536   GFRAA >60 07/15/2019 1225   GFRAA >60 09/29/2012 0536    Lipid Panel  No results found for: CHOL, TRIG, HDL, CHOLHDL, VLDL, LDLCALC, LDLDIRECT   Imaging I have reviewed the images obtained:  CT-scan of the brain-acute nonhemorrhagic infarct involving the right basal ganglia with hypoattenuation of the right lenticular nuclear form.  Hyperdense right MCA suggest thrombus  CTA head/CTA neck/perfusion-occluded right internal carotid artery  without reconstitution at the neck or skull base.  There is some flow into the proximal right  M1 segment via a patent anterior communicating artery and the right A1 segment.  There is some collateral position of right MCA branch vessels.  CT perfusion study demonstrates an ischemic penumbra involving most of the right MCA territory with large mismatch volume of 142 mL.  Core infarct corresponds to the noncontrast CT of head with involvement of the right lentiform nucleus.  No hemorrhage was noted.  Diffuse distal small vessel disease.  Etta Quill PA-C [facility] [phone]  M-F  (9:00 am- 5:00 PM)  07/15/2019, 1:55 PM     Assessment:  48 year old female who has suffered a significantly large right MCA area of ischemia secondary to occluded right ICA.  Although patient was out of the TPA window perfusion studies did show a large penumbra along with patient within the window to have endovascular treatment.   She has been enrolled in the timeless trial.   Impression: Cerebral infarct due to occlusion of the right internal carotid artery Carotid dissection Cerebral edema(in lentiform nucleus)  Plan: -Admit to ICU -Blood pressure control per IR -Diabetes controlled via SSI scale - HgbA1c, fasting lipid panel - MRI  of the brain without contrast - Frequent neuro checks - Echocardiogram - Prophylactic therapy-Antiplatelet med: none for 24 hours - Risk factor modification - Telemetry monitoring - PT consult, OT consult, Speech consult - Stroke team to follow  This patient is critically ill and at significant risk of neurological worsening, death and care requires constant monitoring of vital signs, hemodynamics,respiratory and cardiac monitoring, neurological assessment, discussion with family, other specialists and medical decision making of high complexity. I spent 65 minutes of neurocritical care time  in the care of  this patient. This was time spent independent of  any time provided by nurse practitioner or PA.  Roland Rack, MD [facility] [phone]  If 7pm- 7am, please page neurology on call as listed in [place]. 07/15/2019  4:13 PM

## 2019-07-15 NOTE — Anesthesia Procedure Notes (Addendum)
Arterial Line Insertion Start/End11/24/2020 12:55 PM, 07/15/2019 1:01 PM Performed by: Lavell Luster, CRNA, CRNA  Patient location: Pre-op. Preanesthetic checklist: patient identified, IV checked, site marked, risks and benefits discussed, surgical consent, monitors and equipment checked, pre-op evaluation, timeout performed and anesthesia consent Patient sedated Right, radial was placed Catheter size: 20 G Hand hygiene performed  and maximum sterile barriers used   Attempts: 1 Procedure performed without using ultrasound guided technique. Following insertion, dressing applied and Biopatch. Post procedure assessment: normal and unchanged  Patient tolerated the procedure well with no immediate complications.

## 2019-07-15 NOTE — Code Documentation (Addendum)
Patient enrolled in Timeless trial by MD Leonie Man. Patient to have post-thrombolytic order set placed.

## 2019-07-15 NOTE — ED Triage Notes (Signed)
Pt from home via ems; LSN 0500; ems called out around 1000 when pt fell out of bed and hit head; pt refused transport at the time; after ems left, pt began having slurred speech, L side weakness, L facial droop, and R sided gaze; Hx DVT 3 weeks ago in R leg

## 2019-07-15 NOTE — Consult Note (Signed)
NAME:  Kathryn Coffey, MRN:  [id number], DOB:  08-03-1971, LOS: 0 ADMISSION DATE:  07/15/2019, CONSULTATION DATE:  11/24 REFERRING MD:  Dr. Leonel Ramsay, CHIEF COMPLAINT:  CVA   Brief History   48 year old female admitted 11/24 with CVA and taken to neuro IR for repair. Post operatively she remained on the ventilator and was transferred to ICU for recovery.   History of present illness   Patient is encephalopathic and/or intubated. Therefore history has been obtained from chart review.  48 year old female with PMH as below, which is significant for DM, GERD, HTN, IBS, and PCOS. She presented to [facility] ED in the morning hours of 11/24 with complaints headache and left sided weakness. She was last seen normal around 5 AM that morning. Then around [place] she suffered a fall, but refused EMS transport at that time. Later, she developed headache and left sided weakness causing EMS call. She was treated as a code stroke and underwent CT head immediately upon arrival the ED. CT showed acute infarct of right basal ganglia and hyperdense R MCA. CT angiogram demonstrated right ICA occlusion. CTP demonstrated ischemic penumbra and he was taken to neuro IR for revascularization. Had Rt MCA revascularization and repair for R ICU occlusive dissection. Post-operatively she was sent to the ICU on ventilator for recovery.   Past Medical History   has a past medical history of Abdominal wall cellulitis (2013 and 2014), Asthma, B12 deficiency, Complication of anesthesia, Costochondritis, Diabetes mellitus without complication ([place]), Gastric anomaly, GERD (gastroesophageal reflux disease), Herpes (06/2018), History of abnormal cervical Pap smear, History of Clostridium difficile colitis, History of hiatal hernia, History of kidney stones, Hypertension, IBS (irritable bowel syndrome), Migraine, Morbid obesity ([place]), PCOS (polycystic ovarian syndrome), and PONV (postoperative nausea and vomiting).   Significant Hospital  Events   11/24: admit with CVA, to neuro IR for MCA/ICA revascularization on vent.   Consults:  IR PCCM  Procedures:  ETT 11/24 >  Significant Diagnostic Tests:  CT head 11/24 > acute nonhemorrhagic infarct of the right basal ganglia and hypo attenuation o th e R lenitfor nucleus. Hyperdense R MCA.  CTA head/neck 11/24 >  Occluded R ICA, some collateral circulation to r MCA and m1 vessels.  CTP 11/24 > 142 mL of ischemic penumbra involving most of the R MCA territory.   Micro Data:  COVID 11/24 > neg  Antimicrobials:    Interim history/subjective:    Objective   Height 5\' 5"  (1.651 m), weight 122.5 kg.        Intake/Output Summary (Last 24 hours) at 07/15/2019 1619 Last data filed at 07/15/2019 1541 Gross per 24 hour  Intake -  Output 1050 ml  Net -1050 ml   Filed Weights   07/15/19 1252  Weight: 122.5 kg    Examination: General: obese middle aged female in NAD on vent HENT: [place]/AT, PERRL, no JVD Lungs: Clear bilateral breath sounds Cardiovascular: Tachy, regular, no MRG Abdomen: Soft, non-distended Extremities: No acute deformity. Moving RUE spontaneously Neuro: [facility] Problem list     Assessment & Plan:   Acute R MCA CVA with R ICA dissection - s/p revascularization of revascularization and repair of dissection. - Post op care per IR. - Stroke workup / management per IR. - F/u echo, MRI brain.  Respiratory insufficiency - due to inability to protect the airway in the setting of above. [place] history: no eveidenc of exacerbation - Full vent support. - Assess ABG. - Wean as  able. - Daily SBT for hopeful extubation in AM. - Bronchial hygiene. - Follow CXR. - PRN albuterol  Hypocalcemia. - 1g Ca gluconate.  Hx HTN. - Hold preadmission hyzaar, dyazide.  Hx DM. - SSI. - Hold preadmission liraglutide, metformin.  Hx HSV (? [place]). - Continue preadmission valtrex.  Best practice:  Diet: NPO Pain/Anxiety/Delirium  protocol (if indicated): Propofol, PRN fentanyl per PAD protocol for RASS goal -1 to -2 VAP protocol (if indicated): Y DVT prophylaxis: Per primary GI prophylaxis: PPI Glucose control: SSI Mobility: BR Code Status: FULL Family Communication: Per primary Disposition: ICU  Labs   CBC: Recent Labs  Lab 07/15/19 1225 07/15/19 1233  WBC 10.7*  --   NEUTROABS 9.6*  --   HGB 11.9* 13.6  HCT 39.9 40.0  MCV 78.1*  --   PLT 315  --     Basic Metabolic Panel: Recent Labs  Lab 07/15/19 1225 07/15/19 1233  NA 135 133*  K 4.7 4.7  CL 100 102  CO2 20*  --   GLUCOSE 235* 241*  BUN 16 21*  CREATININE 0.75 0.50  CALCIUM 9.4  --    GFR: Estimated Creatinine Clearance: 115.4 mL/min (by C-G formula based on SCr of 0.5 mg/dL). Recent Labs  Lab 07/15/19 1225  WBC 10.7*    Liver Function Tests: Recent Labs  Lab 07/15/19 1225  AST 58*  ALT 50*  ALKPHOS 115  BILITOT 0.7  PROT 7.0  ALBUMIN 3.8   No results for input(s): LIPASE, AMYLASE in the last 168 hours. No results for input(s): AMMONIA in the last 168 hours.  ABG    Component Value Date/Time   TCO2 24 07/15/2019 1233     Coagulation Profile: Recent Labs  Lab 07/15/19 1225  INR QUESTIONABLE RESULTS, RECOMMEND RECOLLECT TO VERIFY    Cardiac Enzymes: No results for input(s): CKTOTAL, CKMB, CKMBINDEX, TROPONINI in the last 168 hours.  HbA1C: No results found for: HGBA1C  CBG: No results for input(s): GLUCAP in the last 168 hours.  Review of Systems:   Unable as patient is encephalopathic and intubated.   Past Medical History  She,  has a past medical history of Abdominal wall cellulitis (2013 and 2014), Asthma, B12 deficiency, Complication of anesthesia, Costochondritis, Diabetes mellitus without complication ([place]), Gastric anomaly, GERD (gastroesophageal reflux disease), Herpes (06/2018), History of abnormal cervical Pap smear, History of Clostridium difficile colitis, History of hiatal hernia, History of  kidney stones, Hypertension, IBS (irritable bowel syndrome), Migraine, Morbid obesity ([place]), PCOS (polycystic ovarian syndrome), and PONV (postoperative nausea and vomiting).   Surgical History    Past Surgical History:  Procedure Laterality Date  . BREAST EXCISIONAL BIOPSY Left    cyst excision - Dr. Patty Sermons  . CESAREAN SECTION    . CHOLECYSTECTOMY    . COLONOSCOPY    . EAR CYST EXCISION Right 07/04/2018   Procedure: EXCISION GLOMUS TUMOR THUMBNAIL;  Surgeon: Corky Mull, MD;  Location: [facility] ORS;  Service: Orthopedics;  Laterality: Right;  . ESOPHAGOGASTRODUODENOSCOPY    . ESOPHAGOGASTRODUODENOSCOPY (EGD) WITH PROPOFOL N/A 12/06/2015   Procedure: ESOPHAGOGASTRODUODENOSCOPY (EGD) WITH PROPOFOL;  Surgeon: Manya Silvas, MD;  Location: [facility] ENDOSCOPY;  Service: Endoscopy;  Laterality: N/A;  . IR PERCUTANEOUS ART THROMBECTOMY/INFUSION INTRACRANIAL INC DIAG ANGIO  07/15/2019  . LAPAROSCOPIC GASTRIC BANDING    . LAPAROSCOPIC GASTRIC RESTRICTIVE DUODENAL PROCEDURE (DUODENAL SWITCH) N/A 01/25/2016   Procedure: LAPAROSCOPIC GASTRIC RESTRICTIVE DUODENAL PROCEDURE (DUODENAL SWITCH);  Surgeon: Ladora Daniel, MD;  Location: [facility] ORS;  Service:  General;  Laterality: N/A;  . OVARY SURGERY     x2  . TONSILLECTOMY    . TUBAL LIGATION    . UMBILICAL HERNIA REPAIR N/A 01/25/2016   Procedure: LAPAROSCOPIC UMBILICAL HERNIA;  Surgeon: Ladora Daniel, MD;  Location: [facility] ORS;  Service: General;  Laterality: N/A;  . UMBILICAL HERNIA REPAIR N/A 10/20/2016   Procedure: HERNIA REPAIR UMBILICAL ADULT;  Surgeon: Leonie Green, MD;  Location: [facility] ORS;  Service: General;  Laterality: N/A;     Social History   reports that she has never smoked. She has never used smokeless tobacco. She reports current alcohol use. She reports that she does not use drugs.   Family History   Her family history includes Breast cancer (age of onset: 73) in her maternal grandmother; Diabetes in her father and mother.    Allergies Allergies  Allergen Reactions  . Azithromycin Other (See Comments)    Abdominal wall abcess  . Ivp Dye [Iodinated Diagnostic Agents] Hives  . Lexapro [Escitalopram] Other (See Comments)    migraine  . Vicodin [Hydrocodone-Acetaminophen] Other (See Comments)    migraine     Home Medications  Prior to Admission medications   Medication Sig Start Date End Date Taking? Authorizing Provider  albuterol (PROVENTIL HFA;VENTOLIN HFA) 108 (90 Base) MCG/ACT inhaler Inhale 2 puffs into the lungs every 6 (six) hours as needed for wheezing or shortness of breath.    [provider]  amphetamine-dextroamphetamine (ADDERALL) 10 MG tablet Take 10 mg by mouth See admin instructions. Take 10 mg by mouth daily Monday through Friday. 05/28/17   [provider]  buPROPion (WELLBUTRIN) 75 MG tablet Take 75 mg by mouth 2 (two) times daily.    [provider]  Calcium Carb-Cholecalciferol (CALCIUM + VITAMIN D3 PO) Take 1 tablet by mouth daily.    [provider]  fexofenadine (ALLEGRA) 180 MG tablet Take 180 mg by mouth daily as needed for allergies.     [provider]  fluticasone (FLONASE) 50 MCG/ACT nasal spray Place 2 sprays into both nostrils daily as needed for allergies or rhinitis.     [provider]  levonorgestrel (MIRENA) 20 MCG/24HR IUD 1 each by Intrauterine route once.    [provider]  liraglutide (VICTOZA) 18 MG/3ML SOPN Inject 1.2 mg into the skin daily.    [provider]  losartan-hydrochlorothiazide (HYZAAR) 100-25 MG tablet Take 1 tablet by mouth every morning.     [provider]  metFORMIN (GLUCOPHAGE) 500 MG tablet Take 500 mg by mouth 2 (two) times daily. 06/20/18   [provider]  Multiple Vitamins-Minerals (MULTIVITAMIN PO) Take 2 tablets by mouth daily.    [provider]  neomycin-polymyxin b-dexamethasone (MAXITROL) 3.5-10000-0.1 SUSP Place 1 drop into the right eye 3  (three) times daily.    [provider]  NON FORMULARY 1 Dose once a week.    [provider]  traMADol (ULTRAM) 50 MG tablet Take 1 tablet (50 mg total) by mouth every 6 (six) hours as needed for moderate pain. 07/04/18   Poggi, Marshall Cork, MD  triamterene-hydrochlorothiazide (DYAZIDE) 37.5-25 MG capsule Take 1 capsule by mouth every morning.     [provider]  valACYclovir (VALTREX) 1000 MG tablet Take 1,000 mg by mouth 3 (three) times daily.    [provider]     Critical care time: 35 minutes     Georgann Housekeeper, AGACNP-BC [place] for personal pager PCCM on call  pager [phone]  07/15/2019 4:47 PM

## 2019-07-15 NOTE — Sedation Documentation (Signed)
Sheath remaining in place. Gauze and transparent dressing placed over sheath for securement. Patient being transported to 4N31.

## 2019-07-15 NOTE — Progress Notes (Signed)
  Echocardiogram 2D Echocardiogram has been performed.  Kathryn Coffey 07/15/2019, 5:53 PM

## 2019-07-15 NOTE — Progress Notes (Signed)
Patient ID: Kathryn Coffey, female   DOB: [birth year], 48 y.o.   MRN: [id number] INR  Post procedure  CT brain NO ICH or mass effect. RT groin  36F sheath left to arterial flush. Distal pulses palpable  Dps and PT bilaterally. Left intubated to protect airway. S.Daveena Elmore MD

## 2019-07-15 NOTE — Transfer of Care (Signed)
Immediate Anesthesia Transfer of Care Note  Patient: Kathryn Coffey  Procedure(s) Performed: IR WITH ANESTHESIA (N/A )  Patient Location: ICU  Anesthesia Type:General  Level of Consciousness: Patient remains intubated per anesthesia plan  Airway & Oxygen Therapy: Patient remains intubated per anesthesia plan and Patient placed on Ventilator (see vital sign flow sheet for setting)  Post-op Assessment: Report given to RN and Post -op Vital signs reviewed and stable  Post vital signs: Reviewed and stable  Last Vitals:  Vitals Value Taken Time  BP 145/99 07/15/19 1620  Temp    Pulse 100 07/15/19 1626  Resp 18 07/15/19 1626  SpO2 96 % 07/15/19 1626  Vitals shown include unvalidated device data.  Last Pain: There were no vitals filed for this visit.       Complications: No apparent anesthesia complications

## 2019-07-15 NOTE — Progress Notes (Signed)
Responded to ED page to support patient husband in consult A room.  Pt  Experienced Blood clot, stroke to brain  Per her doctor. Patient has gone to IR. I had no direct contact with Patient. Escorted Husband and parents to waiting area as instructed by patient doctor.  Provided emotional and spiritual support as needed to all family gathered.  Chaplain will continue support and follow as needed.Oswaldo Milian, Rusconi Benton, [place], [facility], Pager [phone]

## 2019-07-15 NOTE — Code Documentation (Signed)
47 yo female coming from home with complaints of left sided weakness, right gaze, left hemianopia, left decreased sensation, and dysarthria. Pt was LKW at 0500 per son. At 1000a, pt fell out of bed and EMS was called. EMS got her out of the floor and helped her back in bed, but she refused transport at this time. Pt continue to get worse and family noted left sided weakness. EMS called back and transported patient as a Code Stroke Team. Stroke Team met patient upon arrival to the ED. Initial NIHSS 15 - See Stroke Timeline for Details. CT completed. Pt was not a tPA candidate due to being outside the tPA window. IV access lost at this time. Placed 20 Gauge Diffusics IV in right AC. Pt has contrast allergy. 50 mg of Benadryl and 125 mcg of Solumedrol given before CTA. CTA/CTP completed. Pt deemed IR candidated and transferred to IR Bay 8 at 1232. Anesthesia met patient in Bay 8. Pt intubated and move to IR Suite 2. Handoff given to Michael, RN.  

## 2019-07-15 NOTE — Sedation Documentation (Signed)
Patient in 4N31. Pt remains under care of CRNA. Bedside report, pulse check and sheath check performed with Bedside RN Danise Mina.

## 2019-07-15 NOTE — Anesthesia Procedure Notes (Addendum)
Procedure Name: Intubation Date/Time: 07/15/2019 12:52 PM Performed by: Janace Litten, CRNA Pre-anesthesia Checklist: Patient identified, Emergency Drugs available, Patient being monitored, Timeout performed and Suction available Patient Re-evaluated:Patient Re-evaluated prior to induction Oxygen Delivery Method: Circle system utilized Preoxygenation: Pre-oxygenation with 100% oxygen Induction Type: IV induction Laryngoscope Size: 3 and Glidescope Grade View: Grade I Tube size: 7.5 mm Number of attempts: 1 Airway Equipment and Method: Stylet and Video-laryngoscopy Placement Confirmation: ETT inserted through vocal cords under direct vision,  positive ETCO2 and breath sounds checked- equal and bilateral Secured at: 22 cm Tube secured with: Tape Dental Injury: Teeth and Oropharynx as per pre-operative assessment

## 2019-07-15 NOTE — Progress Notes (Signed)
ABG drawn post ventilator initiation with results as follows:  PH:  7.22  PCO2  54  PO2  87   HCO3-  22.   Results hand-delivered to P. Hoffman, NP while on the unit.  Orders received to increase rate to 22 and repeat ABG at 1915.

## 2019-07-16 ENCOUNTER — Inpatient Hospital Stay (HOSPITAL_COMMUNITY): Payer: No Typology Code available for payment source

## 2019-07-16 ENCOUNTER — Inpatient Hospital Stay: Payer: Self-pay

## 2019-07-16 ENCOUNTER — Encounter (HOSPITAL_COMMUNITY): Payer: Self-pay | Admitting: Interventional Radiology

## 2019-07-16 ENCOUNTER — Other Ambulatory Visit: Payer: Self-pay

## 2019-07-16 ENCOUNTER — Encounter (HOSPITAL_COMMUNITY): Payer: Self-pay

## 2019-07-16 DIAGNOSIS — J9601 Acute respiratory failure with hypoxia: Secondary | ICD-10-CM

## 2019-07-16 LAB — BASIC METABOLIC PANEL
Anion gap: 11 (ref 5–15)
BUN: 17 mg/dL (ref 6–20)
CO2: 22 mmol/L (ref 22–32)
Calcium: 8.3 mg/dL — ABNORMAL LOW (ref 8.9–10.3)
Chloride: 102 mmol/L (ref 98–111)
Creatinine, Ser: 0.82 mg/dL (ref 0.44–1.00)
GFR calc Af Amer: >60 mL/min (ref >60)
GFR calc non Af Amer: >60 mL/min (ref >60)
Glucose, Bld: 184 mg/dL — ABNORMAL HIGH (ref 70–99)
Potassium: 3.5 mmol/L (ref 3.5–5.1)
Sodium: 135 mmol/L (ref 135–145)

## 2019-07-16 LAB — GLUCOSE, CAPILLARY
Glucose-Capillary: 202 mg/dL — ABNORMAL HIGH (ref 70–99)
Glucose-Capillary: 134 mg/dL — ABNORMAL HIGH (ref 70–99)
Glucose-Capillary: 157 mg/dL — ABNORMAL HIGH (ref 70–99)
Glucose-Capillary: 208 mg/dL — ABNORMAL HIGH (ref 70–99)
Glucose-Capillary: 153 mg/dL — ABNORMAL HIGH (ref 70–99)

## 2019-07-16 LAB — CBC WITH DIFFERENTIAL/PLATELET
Abs Immature Granulocytes: 0.09 10*3/uL — ABNORMAL HIGH (ref 0.00–0.07)
Basophils Absolute: 0 10*3/uL (ref 0.0–0.1)
Basophils Relative: 0 %
Eosinophils Absolute: 0 10*3/uL (ref 0.0–0.5)
Eosinophils Relative: 0 %
HCT: 30.8 % — ABNORMAL LOW (ref 36.0–46.0)
Hemoglobin: 9.3 g/dL — ABNORMAL LOW (ref 12.0–15.0)
Immature Granulocytes: 1 %
Lymphocytes Relative: 8 %
Lymphs Abs: 1.2 10*3/uL (ref 0.7–4.0)
MCH: 23.7 pg — ABNORMAL LOW (ref 26.0–34.0)
MCHC: 30.2 g/dL (ref 30.0–36.0)
MCV: 78.6 fL — ABNORMAL LOW (ref 80.0–100.0)
Monocytes Absolute: 0.8 10*3/uL (ref 0.1–1.0)
Monocytes Relative: 6 %
Neutro Abs: 12.9 10*3/uL — ABNORMAL HIGH (ref 1.7–7.7)
Neutrophils Relative %: 85 %
Platelets: 399 10*3/uL (ref 150–400)
RBC: 3.92 MIL/uL (ref 3.87–5.11)
RDW: 17.3 % — ABNORMAL HIGH (ref 11.5–15.5)
WBC: 15 10*3/uL — ABNORMAL HIGH (ref 4.0–10.5)
nRBC: 0 % (ref 0.0–0.2)

## 2019-07-16 LAB — LIPID PANEL
Cholesterol: 129 mg/dL (ref 0–200)
HDL: 26 mg/dL — ABNORMAL LOW (ref >40)
LDL Cholesterol: 41 mg/dL (ref 0–99)
Total CHOL/HDL Ratio: 5 RATIO
Triglycerides: 309 mg/dL — ABNORMAL HIGH (ref <150)
VLDL: 62 mg/dL — ABNORMAL HIGH (ref 0–40)

## 2019-07-16 LAB — PLATELET INHIBITION P2Y12: Platelet Function  P2Y12: 93 [PRU] — ABNORMAL LOW (ref 182–335)

## 2019-07-16 LAB — HEMOGLOBIN A1C
Hgb A1c MFr Bld: 8 % — ABNORMAL HIGH (ref 4.8–5.6)
Mean Plasma Glucose: 182.9 mg/dL

## 2019-07-16 LAB — TRIGLYCERIDES: Triglycerides: 321 mg/dL — ABNORMAL HIGH (ref <150)

## 2019-07-16 MED ORDER — VALACYCLOVIR HCL 500 MG PO TABS
1000.0000 mg | ORAL_TABLET | Freq: Three times a day (TID) | ORAL | Status: DC
  Administered 2019-07-16 – 2019-07-20 (×12): 1000 mg via ORAL
  Filled 2019-07-16 (×14): qty 2

## 2019-07-16 MED ORDER — IOHEXOL 350 MG/ML SOLN
100.0000 mL | Freq: Once | INTRAVENOUS | Status: AC | PRN
  Administered 2019-07-16: 100 mL via INTRAVENOUS

## 2019-07-16 MED ORDER — METHYLPREDNISOLONE SODIUM SUCC 40 MG IJ SOLR
40.0000 mg | Freq: Once | INTRAMUSCULAR | Status: AC
  Administered 2019-07-16: 40 mg via INTRAVENOUS
  Filled 2019-07-16: qty 1

## 2019-07-16 MED ORDER — DIPHENHYDRAMINE HCL 50 MG/ML IJ SOLN
50.0000 mg | Freq: Once | INTRAMUSCULAR | Status: AC
  Administered 2019-07-16: 50 mg via INTRAVENOUS
  Filled 2019-07-16: qty 1

## 2019-07-16 MED ORDER — CHLORHEXIDINE GLUCONATE 0.12 % MT SOLN
15.0000 mL | Freq: Two times a day (BID) | OROMUCOSAL | Status: DC
  Administered 2019-07-16 – 2019-07-21 (×11): 15 mL via OROMUCOSAL
  Filled 2019-07-16 (×9): qty 15

## 2019-07-16 MED ORDER — TRIAMTERENE-HCTZ 37.5-25 MG PO TABS
1.0000 | ORAL_TABLET | Freq: Every day | ORAL | Status: DC
  Administered 2019-07-16: 1
  Filled 2019-07-16 (×2): qty 1

## 2019-07-16 MED ORDER — ORAL CARE MOUTH RINSE
15.0000 mL | Freq: Two times a day (BID) | OROMUCOSAL | Status: DC
  Administered 2019-07-17 – 2019-07-22 (×9): 15 mL via OROMUCOSAL

## 2019-07-16 MED ORDER — TRIAMTERENE-HCTZ 37.5-25 MG PO TABS: 1.0000 | ORAL_TABLET | Freq: Every day | ORAL | Status: DC

## 2019-07-16 NOTE — Procedures (Signed)
Extubation Procedure Note  Patient Details:   Name: Kathryn Coffey DOB: [birth year] MRN: [id number]   Airway Documentation:    Vent end date: 07/16/19 Vent end time: 1415   Evaluation  O2 sats: stable throughout Complications: No apparent complications Patient did tolerate procedure well. Bilateral Breath Sounds: Clear, Diminished   Pt extubated per MD order. Pt had + cuff leak  Placed on 2L  tolerating well  Pt able to voice and has strong cough   Ciro Backer 07/16/2019, 2:40 PM

## 2019-07-16 NOTE — Progress Notes (Addendum)
STROKE TEAM PROGRESS NOTE   INTERVAL HISTORY I have personally reviewed history of presenting illness with the patient and her husband as well as electronic medical records and imaging films in PACS.  She presented with left hemiplegia and right gaze preference and a stroke scale of 15 yesterday.  She was outside the TPA window but CT angiogram and perfusion studies showed a favorable penumbra and she went for emergent thrombectomy.  She also participated in the time yesterday and was randomized to IV tenecteplase versus placebo.  She was found to have right carotid occlusion in the neck with distal embolization and right M1 occlusion.  She underwent successful thrombectomy and was found to have right ICA dissection for which he underwent pipeline stent placement.  She has been kept sedated overnight on ventilatory support for respiratory failure.  Right groin sheath was pulled this morning.  Blood pressure has been adequately controlled.  She has been arousable and able to follow commands in the midline and on the right side but remains with left hemiplegia  Vitals:   07/16/19 0630 07/16/19 0700 07/16/19 0730 07/16/19 0800  BP: 116/73 118/71 119/74 117/71  Pulse: 64 66 67 67  Resp: (!) 21 (!) 22 19 20   Temp:    98.7 F (37.1 C)  TempSrc:    Axillary  SpO2: 100% 100% 100% 100%  Weight:      Height:        CBC:  Recent Labs  Lab 07/15/19 1225  07/15/19 1944 07/16/19 0450  WBC 10.7*  --   --  15.0*  NEUTROABS 9.6*  --   --  12.9*  HGB 11.9*   < > 10.5* 9.3*  HCT 39.9   < > 31.0* 30.8*  MCV 78.1*  --   --  78.6*  PLT 315  --   --  399   < > = values in this interval not displayed.    Basic Metabolic Panel:  Recent Labs  Lab 07/15/19 1225 07/15/19 1233  07/15/19 1944 07/16/19 0450  NA 135 133*   < > 135 135  K 4.7 4.7   < > 3.9 3.5  CL 100 102  --   --  102  CO2 20*  --   --   --  22  GLUCOSE 235* 241*  --   --  184*  BUN 16 21*  --   --  17  CREATININE 0.75 0.50  --   --   0.82  CALCIUM 9.4  --   --   --  8.3*   < > = values in this interval not displayed.   Lipid Panel:     Component Value Date/Time   CHOL 129 07/16/2019 0450   TRIG 309 (H) 07/16/2019 0450   TRIG 321 (H) 07/16/2019 0450   HDL 26 (L) 07/16/2019 0450   CHOLHDL 5.0 07/16/2019 0450   VLDL 62 (H) 07/16/2019 0450   LDLCALC 41 07/16/2019 0450   HgbA1c:  Lab Results  Component Value Date   HGBA1C 8.0 (H) 07/16/2019   Urine Drug Screen: No results found for: LABOPIA, COCAINSCRNUR, LABBENZ, AMPHETMU, THCU, LABBARB  Alcohol Level No results found for: ETH  IMAGING  Ct Angio Head W Or Wo Contrast Ct Angio Neck W Or Wo Contrast Ct Cerebral Perfusion W Contrast 07/16/2019 1. CT perfusion negative for acute infarct or ischemia. Interval normalization of infarct in the right basal ganglia with ischemia in the right MCA territory on CT perfusion yesterday. 2.  Reocclusion of right internal carotid artery near the origin. Right internal carotid artery stent is occluded. Right MCA now widely patent. 3. Left carotid and both vertebral arteries widely patent.   Ct Angio Head W Or Wo Contrast Ct Angio Neck W Or Wo Contrast Ct Cerebral Perfusion W Contrast 07/15/2019 1. Occluded right internal carotid artery without reconstitution at the neck or skull base. 2. There is some flow into the proximal right M1 segment via a patent anterior communicating artery and right A1 segment. 3. There is some collateral position of right MCA branch vessels. 4. CT perfusion study demonstrates in ischemic penumbra involving most of the right MCA territory with a large mismatch volume of 142 mL. 5. Core infarct corresponds to the noncontrast CT of the head with involvement of the right lentiform nucleus. 6. No acute hemorrhage. 7. Diffuse distal small vessel disease. 8. Tortuosity of the cervical internal carotid arteries bilaterally. This is nonspecific, but often seen in the setting of longstanding hypertension.    Portable Chest Xray 07/16/2019 1.  Endotracheal tube and NG tube in stable position. 2. Low lung volumes with mild bibasilar atelectasis again noted without interim change.  07/15/2019 Tube positions as described without pneumothorax. Mild bibasilar atelectasis. No edema or consolidation. Heart upper normal in size.   Ct Head Wo Contrast 07/15/2019 No acute hemorrhage.   Ir Percutaneous Art Thrombectomy/infusion Intracranial Inc Diag Angio 07/15/2019 1. Occluded right internal carotid artery without reconstitution at the neck or skull base. 2. There is some flow into the proximal right M1 segment via a patent anterior communicating artery and right A1 segment. 3. There is some collateral position of right MCA branch vessels. 4. CT perfusion study demonstrates in ischemic penumbra involving most of the right MCA territory with a large mismatch volume of 142 mL. 5. Core infarct corresponds to the noncontrast CT of the head with involvement of the right lentiform nucleus. 6. No acute hemorrhage. 7. Diffuse distal small vessel disease. 8. Tortuosity of the cervical internal carotid arteries bilaterally. This is nonspecific, but often seen in the setting of longstanding hypertension.   Ct Head Code Stroke Wo Contrast 07/15/2019 1. Acute non hemorrhagic infarct involving the right basal ganglia with hypoattenuation of the right lentiform nucleus. 2. Hyperdense right MCA suggesting thrombus. 3. ASPECTS is 9/10   Cerebral Angiogram 07/15/2019 S/P revascularization of Rt MCA M 1 with x 1 pass with the 40mm x 83mm embotrap retriver device and  Penumbra aspiration achieving a TICI 3 revascularization.  Repair of RT ICA prox  occlusive dissection with  Flow diverter device.  2D Echocardiogram 1. Left ventricular ejection fraction, by visual estimation, is 60 to 65%. The left ventricle has normal function. There is no left ventricular hypertrophy.  2. Left ventricular diastolic parameters are  consistent with Grade III diastolic dysfunction (restrictive).  3. The left ventricle has no regional wall motion abnormalities.  4. Global right ventricle has normal systolic function.The right ventricular size is normal. No increase in right ventricular wall thickness.  5. Left atrial size was normal.  6. Right atrial size was normal.  7. The mitral valve is normal in structure. No evidence of mitral valve regurgitation. No evidence of mitral stenosis.  8. The tricuspid valve is normal in structure. Tricuspid valve regurgitation is not demonstrated.  9. The aortic valve is normal in structure. Aortic valve regurgitation is not visualized. No evidence of aortic valve sclerosis or stenosis. 10. The pulmonic valve was grossly normal. Pulmonic valve regurgitation is not  visualized. 11. TR signal is inadequate for assessing pulmonary artery systolic pressure. 12. The inferior vena cava is normal in size with greater than 50% respiratory variability, suggesting right atrial pressure of 3 mmHg.   PHYSICAL EXAM Obese middle-aged Caucasian lady who is sedated intubated on ventilatory support. . Afebrile. Head is nontraumatic. Neck is supple without bruit.    Cardiac exam no murmur or gallop. Lungs are clear to auscultation. Distal pulses are well felt. Neurological Exam : Patient is sedated with propofol drip and intubated.  She can be aroused with sternal rub to follow simple midline commands and right-sided commands.  Pupils are 3 mm equal reactive.  Doll's eye movements are sluggish.  There is no fixed gaze deviation.  She blinks to threat bilaterally.  She is able to move right upper and lower extremities against gravity purposefully.  There is trace withdrawal in the left upper and lower extremity to painful stimuli only.  Tone is diminished on the left compared to the right.  Right plantars downgoing left is upgoing.  Gait cannot be tested. NIHSS 18  ASSESSMENT/PLAN [name] is a 49 y.o.  female with history of morbid obesity, DB, HTN, migraine, asthma, GERD who fell at home and refused to go to the hospital. Later she o of HA and noted to be flaccid on her L side. EMS was called. Enrolled in the Timeless Trial. Received tenecteplase at 1334 on 07/15/2019.  Stroke:   R MCA infarct s/p tenecteplase (Timeless) and IR w/ revascularization M1 and R ICA stent, now occluded - thrombembolic in setting of large vessel disease source  Code Stroke CT head R basal ganglia infarct w/ hypoattenuation R lentiform nucleus. Hyperdense R MCA. ASPECTS 9    CTA head & neck R ICA occlusion. Small vessel disease. Tortuous ICAs.   CT perfusion R PCA penumbra w/ 142 mL large mismatch. R lentiform nucleus core infarct.   Cerebra angio occluded R MCA M1 w/ TICI3 revascularization. Repair of R ICA proximal occlusive dissection w/ flow diverter  Post IR CT brain NO ICH or mass effect.  CT head no ICH  Repeat CTA head & neck reocclusion R ICA near origin. R ICA stent is occluded. R MCA patent. L ICA and B VA open.   Repeat CT perfusion no infarct. R basal ganglia normalization of infarct   MRI  pending  2D Echo EF 60-65%. No source of embolus   LDL 41  HgbA1c 8.0  SCDs for VTE prophylaxis  No antithrombotic prior to admission, now on aspirin 81 mg daily and Brilinta (ticagrelor) 90 mg bid given stent placement.  Therapy recommendations:  pending   Disposition:  pending   Acute Respiratory Failure  Intubated  sedated  CCM on board  Hypertension  Home meds:  Losartan-HCTZ 100-25, triamterene-HCTZ 34.5-25  Stable BP goal per IR x 24h following IR and post tPA protocol x 24h following tenecteplase administration . Long-term BP goal normotensive  Diabetes type II Uncontrolled  Home meds:  victoza 18, metformin 500 bid  HgbA1c 8.0, goal < 7.0  CBGs  SSI  DB RN following  Dysphagia . Secondary to stroke . NPO . 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  She presented with right hemispheric infarct due to right MCA occlusion likely embolization from proximal right carotid occlusion due to dissection and underwent emergent thrombectomy followed by rescue right carotid artery stent placement.  Continue close neurological monitoring and strict blood pressure control as per post interventional protocol.  Discontinue groin sheath.  Sit up in bed and consider extubation if tolerated.  Repeat brain imaging as per time the study protocol later today.  Long discussion with patient's husband and answered questions about her care.  Discussed with Dr. Lynetta Mare critical care medicine and Dr. Estanislado Pandy interventional neuroradiologist.  Continue aspirin and Brilinta for carotid stent..This patient is critically ill and at significant risk of neurological worsening, death and care requires constant monitoring of vital signs, hemodynamics,respiratory and cardiac monitoring, extensive review of multiple databases, frequent neurological assessment, discussion with family, other specialists and medical decision making of high complexity.I have made any additions or clarifications directly to the above note.This critical care time does not reflect procedure time, or teaching time or supervisory time of PA/NP/Med Resident etc but could involve care discussion time.  I spent 70 minutes of neurocritical care time  in the care of  this patient.       Antony Contras, MD Medical Director [facility] Pager: [phone] 07/16/2019 1:46 PM   To contact Stroke Continuity provider, please refer to [email or web address] After hours, contact General Neurology

## 2019-07-16 NOTE — Progress Notes (Signed)
84fr sheath removed using manual pressure and quikclot at 0822hrs.  Hemostasis achieved at 0844 hrs.  Groin site reviewed with pt's RN Tammy.  Distal pulses intact.  Quikclot dressing should be removed by 0830 hrs 07/17/2019.  Melinda Alewine Rt-R [employer] Rt-R

## 2019-07-16 NOTE — Progress Notes (Signed)
Stroke MD paged about Kathryn Coffey medication that was scheduled originally for 2000. RN mentioned that patient was involved in the "Timeless Study" and did the patient need to receive this medication. Neuro MD mentioned that patient had a stent placed during the IR procedure and that she needed the [place]. New order to obtain a CT of the head to make sure there is no hemorrhagic conversion. CT of head was completed at 2053, read by Radiologist and no bleeding was present on the scan. Kathryn Coffey was given at 2146 per Dr. Arrie Eastern instructions.

## 2019-07-16 NOTE — Progress Notes (Signed)
Referring Physician(s):  Dr Mike Craze  Supervising Physician: Luanne Bras  Patient Status:  [facility] - In-pt  Chief Complaint:  CVA  Subjective:  S/P revascularization of Rt MCA M 1 with x 1 pass with the 9mm x 78mm embotrap retriver device and  Penumbra aspiration achieving a TICI 3 revascularization.  Repair of RT ICA prox  occlusive dissection with  Flow diverter device  Allergies: Azithromycin, Ivp dye [iodinated diagnostic agents], Lexapro [escitalopram], and Vicodin [hydrocodone-acetaminophen]  Medications: Prior to Admission medications   Medication Sig Start Date End Date Taking? Authorizing Provider  albuterol (PROVENTIL HFA;VENTOLIN HFA) 108 (90 Base) MCG/ACT inhaler Inhale 2 puffs into the lungs every 6 (six) hours as needed for wheezing or shortness of breath.   Yes [provider]  amphetamine-dextroamphetamine (ADDERALL) 10 MG tablet Take 10 mg by mouth See admin instructions. Take 10 mg by mouth daily Monday through Friday. 05/28/17  Yes [provider]  buPROPion (WELLBUTRIN) 75 MG tablet Take 75 mg by mouth 2 (two) times daily.   Yes [provider]  Calcium Carb-Cholecalciferol (CALCIUM + VITAMIN D3 PO) Take 1 tablet by mouth daily.   Yes [provider]  fexofenadine (ALLEGRA) 180 MG tablet Take 180 mg by mouth daily as needed for allergies.    Yes [provider]  fluticasone (FLONASE) 50 MCG/ACT nasal spray Place 2 sprays into both nostrils daily as needed for allergies or rhinitis.    Yes [provider]  liraglutide (VICTOZA) 18 MG/3ML SOPN Inject 1.2 mg into the skin daily.   Yes [provider]  losartan-hydrochlorothiazide (HYZAAR) 100-25 MG tablet Take 1 tablet by mouth every morning.    Yes [provider]  metFORMIN (GLUCOPHAGE) 500 MG tablet Take 500 mg by mouth 2 (two) times daily. 06/20/18  Yes [provider]  Multiple Vitamins-Minerals (MULTIVITAMIN PO) Take 2  tablets by mouth daily.   Yes [provider]  NON FORMULARY 1 Dose once a week. Wednesdays   Yes [provider]  omeprazole (PRILOSEC) 20 MG capsule Take 20 mg by mouth daily. 11/11/18 11/11/19 Yes [provider]  PREDNISONE PO Take 1 tablet by mouth as needed (headache).   Yes [provider]  traMADol (ULTRAM) 50 MG tablet Take 1 tablet (50 mg total) by mouth every 6 (six) hours as needed for moderate pain. 07/04/18  Yes Poggi, Marshall Cork, MD  triamterene-hydrochlorothiazide (DYAZIDE) 37.5-25 MG capsule Take 1 capsule by mouth every morning.    Yes [provider]     Vital Signs: BP 129/80    Pulse 66    Temp 98.7 F (37.1 C) (Axillary)    Resp 19    Ht 5\' 5"  (1.651 m)    Wt 274 lb 4 oz (124.4 kg)    SpO2 100%    BMI 45.64 kg/m   Physical Exam Vitals signs reviewed.  Constitutional:      Comments: Sedated intubated  Musculoskeletal:     Comments: Moving Rt leg  Follows commands on Rt per RN No other movement   Skin:    Comments: Rt groin sheath just removed IR techs still holding preesure     Imaging: Ct Angio Head W Or Wo Contrast  Result Date: 07/15/2019 CLINICAL DATA:  Acute onset of left-sided weakness. Abnormal CT of the head. EXAM: CT ANGIOGRAPHY HEAD AND NECK CT PERFUSION BRAIN TECHNIQUE: Multidetector CT imaging of the head and neck was performed using the standard protocol during bolus administration of intravenous  contrast. Multiplanar CT image reconstructions and MIPs were obtained to evaluate the vascular anatomy. Carotid stenosis measurements (when applicable) are obtained utilizing NASCET criteria, using the distal internal carotid diameter as the denominator. Multiphase CT imaging of the brain was performed following IV bolus contrast injection. Subsequent parametric perfusion maps were calculated using RAPID software. CONTRAST:  147mL OMNIPAQUE IOHEXOL 350 MG/ML SOLN COMPARISON:  CT head without contrast 07/15/19 FINDINGS:  CTA NECK FINDINGS Aortic arch: There is a common origin of the left common carotid artery in the innominate artery, a normal variant. Aorta is otherwise within normal limits. No significant atherosclerotic disease is present. Right carotid system: The right common carotid artery is within normal limits. The bifurcation is unremarkable. There is moderate tortuosity in the mid cervical right ICA. The mid cervical right ICA is occluded without reconstitution in the neck. Left carotid system: The left common carotid artery is within normal limits. The bifurcation is unremarkable. There is moderate tortuosity in the mid cervical left ICA without significant stenosis. Vertebral arteries: The left vertebral artery is the dominant vessel. Both vertebral arteries originate from the subclavian arteries without significant stenosis. There is no significant stenosis of either vertebral artery in the neck. Skeleton: Vertebral body heights alignment are maintained. Mild degenerative changes are most evident at C3-4 and C5-6. Other neck: The tissues the neck are otherwise unremarkable. No focal mucosal or submucosal lesions are present. Thyroid is within normal limits. No significant adenopathy is present. Salivary glands are normal. Upper chest: Lung apices are clear.  Thoracic inlet is normal. Review of the MIP images confirms the above findings CTA HEAD FINDINGS Anterior circulation: There is no reconstitution of the right internal carotid artery before the ICA terminus. There is flow from the left which extends into the proximal right M1 segment. There is some collateralization to posterior right MCA branches. The left MCA bifurcation is within normal limits. Proximal ACA vessels are bilaterally. Distal branch vessel narrowing is present in the ACA vessels and right MCA vessels. Posterior circulation: The left vertebral artery is dominant. There is a moderate stenosis of the right V3 segment proximal to the PICA origin. Right  PICA is present. V4 segment is hypoplastic or stenotic. The basilar artery is normal. Both posterior cerebral arteries originate the basilar tip. Proximal PCA branch vessels are within normal limits. There is attenuation of distal PCA branch vessels bilaterally. Venous sinuses: Dural sinuses are patent. Straight sinus and deep cerebral veins are intact. Cortical veins are unremarkable. Anatomic variants: NONE Review of the MIP images confirms the above findings CT Brain Perfusion Findings: ASPECTS: 9/10 CBF (<30%) Volume: 32mL Perfusion (Tmax>6.0s) volume: 144mL Mismatch Volume: 127mL Infarction Location:RIGHT MCA TERRITORY IMPRESSION: 1. Occluded right internal carotid artery without reconstitution at the neck or skull base. 2. There is some flow into the proximal right M1 segment via a patent anterior communicating artery and right A1 segment. 3. There is some collateral position of right MCA branch vessels. 4. CT perfusion study demonstrates in ischemic penumbra involving most of the right MCA territory with a large mismatch volume of 142 mL. 5. Core infarct corresponds to the noncontrast CT of the head with involvement of the right lentiform nucleus. 6. No acute hemorrhage. 7. Diffuse distal small vessel disease. 8. Tortuosity of the cervical internal carotid arteries bilaterally. This is nonspecific, but often seen in the setting of longstanding hypertension. These results were called by telephone at the time of interpretation on 07/15/2019 at 12:50 pm to provider PRAMOD SETHI , who  verbally acknowledged these results. Electronically Signed   By: San Morelle M.D.   On: 07/15/2019 12:53   Dg Abd 1 View  Result Date: 07/15/2019 CLINICAL DATA:  Orogastric tube placement. EXAM: ABDOMEN - 1 VIEW COMPARISON:  CT 04/07/2016. FINDINGS: Orogastric tube tip noted over the stomach. No gastric or bowel distention. Right atelectatic changes and/or infiltrate. Degenerative changes lumbar spine. Contrast in the  kidneys. IMPRESSION: Orogastric tube tip noted over the stomach. No gastric or bowel distention. 2. Atelectatic changes and/or infiltrate right lung base. Electronically Signed   By: Marcello Moores  Register   On: 07/15/2019 17:21   Ct Head Wo Contrast  Result Date: 07/15/2019 CLINICAL DATA:  Stroke follow-up EXAM: CT HEAD WITHOUT CONTRAST TECHNIQUE: Contiguous axial images were obtained from the base of the skull through the vertex without intravenous contrast. COMPARISON:  Head CT 07/15/2019 at 12:10 p.m. FINDINGS: Brain: There is no mass, hemorrhage or extra-axial collection. The size and configuration of the ventricles and extra-axial CSF spaces are normal. Unchanged mild hypoattenuation of the right basal ganglia, compatible with core infarct. Vascular: No abnormal hyperdensity of the major intracranial arteries or dural venous sinuses. No intracranial atherosclerosis. Skull: The visualized skull base, calvarium and extracranial soft tissues are normal. Sinuses/Orbits: No fluid levels or advanced mucosal thickening of the visualized paranasal sinuses. No mastoid or middle ear effusion. The orbits are normal. IMPRESSION: No acute hemorrhage. Electronically Signed   By: Ulyses Jarred M.D.   On: 07/15/2019 21:04   Ct Angio Neck W Or Wo Contrast  Result Date: 07/15/2019 CLINICAL DATA:  Acute onset of left-sided weakness. Abnormal CT of the head. EXAM: CT ANGIOGRAPHY HEAD AND NECK CT PERFUSION BRAIN TECHNIQUE: Multidetector CT imaging of the head and neck was performed using the standard protocol during bolus administration of intravenous contrast. Multiplanar CT image reconstructions and MIPs were obtained to evaluate the vascular anatomy. Carotid stenosis measurements (when applicable) are obtained utilizing NASCET criteria, using the distal internal carotid diameter as the denominator. Multiphase CT imaging of the brain was performed following IV bolus contrast injection. Subsequent parametric perfusion maps  were calculated using RAPID software. CONTRAST:  110mL OMNIPAQUE IOHEXOL 350 MG/ML SOLN COMPARISON:  CT head without contrast 07/15/19 FINDINGS: CTA NECK FINDINGS Aortic arch: There is a common origin of the left common carotid artery in the innominate artery, a normal variant. Aorta is otherwise within normal limits. No significant atherosclerotic disease is present. Right carotid system: The right common carotid artery is within normal limits. The bifurcation is unremarkable. There is moderate tortuosity in the mid cervical right ICA. The mid cervical right ICA is occluded without reconstitution in the neck. Left carotid system: The left common carotid artery is within normal limits. The bifurcation is unremarkable. There is moderate tortuosity in the mid cervical left ICA without significant stenosis. Vertebral arteries: The left vertebral artery is the dominant vessel. Both vertebral arteries originate from the subclavian arteries without significant stenosis. There is no significant stenosis of either vertebral artery in the neck. Skeleton: Vertebral body heights alignment are maintained. Mild degenerative changes are most evident at C3-4 and C5-6. Other neck: The tissues the neck are otherwise unremarkable. No focal mucosal or submucosal lesions are present. Thyroid is within normal limits. No significant adenopathy is present. Salivary glands are normal. Upper chest: Lung apices are clear.  Thoracic inlet is normal. Review of the MIP images confirms the above findings CTA HEAD FINDINGS Anterior circulation: There is no reconstitution of the right internal carotid artery before  the ICA terminus. There is flow from the left which extends into the proximal right M1 segment. There is some collateralization to posterior right MCA branches. The left MCA bifurcation is within normal limits. Proximal ACA vessels are bilaterally. Distal branch vessel narrowing is present in the ACA vessels and right MCA vessels.  Posterior circulation: The left vertebral artery is dominant. There is a moderate stenosis of the right V3 segment proximal to the PICA origin. Right PICA is present. V4 segment is hypoplastic or stenotic. The basilar artery is normal. Both posterior cerebral arteries originate the basilar tip. Proximal PCA branch vessels are within normal limits. There is attenuation of distal PCA branch vessels bilaterally. Venous sinuses: Dural sinuses are patent. Straight sinus and deep cerebral veins are intact. Cortical veins are unremarkable. Anatomic variants: NONE Review of the MIP images confirms the above findings CT Brain Perfusion Findings: ASPECTS: 9/10 CBF (<30%) Volume: 68mL Perfusion (Tmax>6.0s) volume: 136mL Mismatch Volume: 182mL Infarction Location:RIGHT MCA TERRITORY IMPRESSION: 1. Occluded right internal carotid artery without reconstitution at the neck or skull base. 2. There is some flow into the proximal right M1 segment via a patent anterior communicating artery and right A1 segment. 3. There is some collateral position of right MCA branch vessels. 4. CT perfusion study demonstrates in ischemic penumbra involving most of the right MCA territory with a large mismatch volume of 142 mL. 5. Core infarct corresponds to the noncontrast CT of the head with involvement of the right lentiform nucleus. 6. No acute hemorrhage. 7. Diffuse distal small vessel disease. 8. Tortuosity of the cervical internal carotid arteries bilaterally. This is nonspecific, but often seen in the setting of longstanding hypertension. These results were called by telephone at the time of interpretation on 07/15/2019 at 12:50 pm to provider PRAMOD [facility] , who verbally acknowledged these results. Electronically Signed   By: San Morelle M.D.   On: 07/15/2019 12:53   Ct Cerebral Perfusion W Contrast  Result Date: 07/15/2019 CLINICAL DATA:  Acute onset of left-sided weakness. Abnormal CT of the head. EXAM: CT ANGIOGRAPHY HEAD AND  NECK CT PERFUSION BRAIN TECHNIQUE: Multidetector CT imaging of the head and neck was performed using the standard protocol during bolus administration of intravenous contrast. Multiplanar CT image reconstructions and MIPs were obtained to evaluate the vascular anatomy. Carotid stenosis measurements (when applicable) are obtained utilizing NASCET criteria, using the distal internal carotid diameter as the denominator. Multiphase CT imaging of the brain was performed following IV bolus contrast injection. Subsequent parametric perfusion maps were calculated using RAPID software. CONTRAST:  185mL OMNIPAQUE IOHEXOL 350 MG/ML SOLN COMPARISON:  CT head without contrast 07/15/19 FINDINGS: CTA NECK FINDINGS Aortic arch: There is a common origin of the left common carotid artery in the innominate artery, a normal variant. Aorta is otherwise within normal limits. No significant atherosclerotic disease is present. Right carotid system: The right common carotid artery is within normal limits. The bifurcation is unremarkable. There is moderate tortuosity in the mid cervical right ICA. The mid cervical right ICA is occluded without reconstitution in the neck. Left carotid system: The left common carotid artery is within normal limits. The bifurcation is unremarkable. There is moderate tortuosity in the mid cervical left ICA without significant stenosis. Vertebral arteries: The left vertebral artery is the dominant vessel. Both vertebral arteries originate from the subclavian arteries without significant stenosis. There is no significant stenosis of either vertebral artery in the neck. Skeleton: Vertebral body heights alignment are maintained. Mild degenerative changes are most evident  at C3-4 and C5-6. Other neck: The tissues the neck are otherwise unremarkable. No focal mucosal or submucosal lesions are present. Thyroid is within normal limits. No significant adenopathy is present. Salivary glands are normal. Upper chest: Lung  apices are clear.  Thoracic inlet is normal. Review of the MIP images confirms the above findings CTA HEAD FINDINGS Anterior circulation: There is no reconstitution of the right internal carotid artery before the ICA terminus. There is flow from the left which extends into the proximal right M1 segment. There is some collateralization to posterior right MCA branches. The left MCA bifurcation is within normal limits. Proximal ACA vessels are bilaterally. Distal branch vessel narrowing is present in the ACA vessels and right MCA vessels. Posterior circulation: The left vertebral artery is dominant. There is a moderate stenosis of the right V3 segment proximal to the PICA origin. Right PICA is present. V4 segment is hypoplastic or stenotic. The basilar artery is normal. Both posterior cerebral arteries originate the basilar tip. Proximal PCA branch vessels are within normal limits. There is attenuation of distal PCA branch vessels bilaterally. Venous sinuses: Dural sinuses are patent. Straight sinus and deep cerebral veins are intact. Cortical veins are unremarkable. Anatomic variants: NONE Review of the MIP images confirms the above findings CT Brain Perfusion Findings: ASPECTS: 9/10 CBF (<30%) Volume: 32mL Perfusion (Tmax>6.0s) volume: 143mL Mismatch Volume: 150mL Infarction Location:RIGHT MCA TERRITORY IMPRESSION: 1. Occluded right internal carotid artery without reconstitution at the neck or skull base. 2. There is some flow into the proximal right M1 segment via a patent anterior communicating artery and right A1 segment. 3. There is some collateral position of right MCA branch vessels. 4. CT perfusion study demonstrates in ischemic penumbra involving most of the right MCA territory with a large mismatch volume of 142 mL. 5. Core infarct corresponds to the noncontrast CT of the head with involvement of the right lentiform nucleus. 6. No acute hemorrhage. 7. Diffuse distal small vessel disease. 8. Tortuosity of the  cervical internal carotid arteries bilaterally. This is nonspecific, but often seen in the setting of longstanding hypertension. These results were called by telephone at the time of interpretation on 07/15/2019 at 12:50 pm to provider PRAMOD [facility] , who verbally acknowledged these results. Electronically Signed   By: San Morelle M.D.   On: 07/15/2019 12:53   Portable Chest Xray  Result Date: 07/16/2019 CLINICAL DATA:  Intubation. EXAM: PORTABLE CHEST 1 VIEW COMPARISON:  Chest x-ray 07/15/2019. FINDINGS: Endotracheal tube and NG tube in stable position. Heart size stable. No pulmonary venous congestion low lung volumes with bibasilar atelectasis. No pleural effusion or pneumothorax. IMPRESSION: 1.  Endotracheal tube and NG tube in stable position. 2. Low lung volumes with mild bibasilar atelectasis again noted without interim change. Electronically Signed   By: Marcello Moores  Register   On: 07/16/2019 07:35   Dg Chest Port 1 View  Result Date: 07/15/2019 CLINICAL DATA:  Hypoxia EXAM: PORTABLE CHEST 1 VIEW COMPARISON:  October 20, 2015 FINDINGS: Endotracheal tube tip is 3.0 cm above the carina. Nasogastric tube tip and side port are below the diaphragm. No pneumothorax. There is mild bibasilar atelectasis. No edema or consolidation. Heart is upper normal in size with pulmonary vascularity normal. No adenopathy. No bone lesions. IMPRESSION: Tube positions as described without pneumothorax. Mild bibasilar atelectasis. No edema or consolidation. Heart upper normal in size. Electronically Signed   By: Lowella Grip III M.D.   On: 07/15/2019 17:25   Ir Percutaneous Art Thrombectomy/infusion Intracranial Inc Diag  Angio  Result Date: 07/15/2019 CLINICAL DATA:  Acute onset of left-sided weakness. Abnormal CT of the head. EXAM: CT ANGIOGRAPHY HEAD AND NECK CT PERFUSION BRAIN TECHNIQUE: Multidetector CT imaging of the head and neck was performed using the standard protocol during bolus administration of  intravenous contrast. Multiplanar CT image reconstructions and MIPs were obtained to evaluate the vascular anatomy. Carotid stenosis measurements (when applicable) are obtained utilizing NASCET criteria, using the distal internal carotid diameter as the denominator. Multiphase CT imaging of the brain was performed following IV bolus contrast injection. Subsequent parametric perfusion maps were calculated using RAPID software. CONTRAST:  175mL OMNIPAQUE IOHEXOL 350 MG/ML SOLN COMPARISON:  CT head without contrast 07/15/19 FINDINGS: CTA NECK FINDINGS Aortic arch: There is a common origin of the left common carotid artery in the innominate artery, a normal variant. Aorta is otherwise within normal limits. No significant atherosclerotic disease is present. Right carotid system: The right common carotid artery is within normal limits. The bifurcation is unremarkable. There is moderate tortuosity in the mid cervical right ICA. The mid cervical right ICA is occluded without reconstitution in the neck. Left carotid system: The left common carotid artery is within normal limits. The bifurcation is unremarkable. There is moderate tortuosity in the mid cervical left ICA without significant stenosis. Vertebral arteries: The left vertebral artery is the dominant vessel. Both vertebral arteries originate from the subclavian arteries without significant stenosis. There is no significant stenosis of either vertebral artery in the neck. Skeleton: Vertebral body heights alignment are maintained. Mild degenerative changes are most evident at C3-4 and C5-6. Other neck: The tissues the neck are otherwise unremarkable. No focal mucosal or submucosal lesions are present. Thyroid is within normal limits. No significant adenopathy is present. Salivary glands are normal. Upper chest: Lung apices are clear.  Thoracic inlet is normal. Review of the MIP images confirms the above findings CTA HEAD FINDINGS Anterior circulation: There is no  reconstitution of the right internal carotid artery before the ICA terminus. There is flow from the left which extends into the proximal right M1 segment. There is some collateralization to posterior right MCA branches. The left MCA bifurcation is within normal limits. Proximal ACA vessels are bilaterally. Distal branch vessel narrowing is present in the ACA vessels and right MCA vessels. Posterior circulation: The left vertebral artery is dominant. There is a moderate stenosis of the right V3 segment proximal to the PICA origin. Right PICA is present. V4 segment is hypoplastic or stenotic. The basilar artery is normal. Both posterior cerebral arteries originate the basilar tip. Proximal PCA branch vessels are within normal limits. There is attenuation of distal PCA branch vessels bilaterally. Venous sinuses: Dural sinuses are patent. Straight sinus and deep cerebral veins are intact. Cortical veins are unremarkable. Anatomic variants: NONE Review of the MIP images confirms the above findings CT Brain Perfusion Findings: ASPECTS: 9/10 CBF (<30%) Volume: 77mL Perfusion (Tmax>6.0s) volume: 193mL Mismatch Volume: 137mL Infarction Location:RIGHT MCA TERRITORY IMPRESSION: 1. Occluded right internal carotid artery without reconstitution at the neck or skull base. 2. There is some flow into the proximal right M1 segment via a patent anterior communicating artery and right A1 segment. 3. There is some collateral position of right MCA branch vessels. 4. CT perfusion study demonstrates in ischemic penumbra involving most of the right MCA territory with a large mismatch volume of 142 mL. 5. Core infarct corresponds to the noncontrast CT of the head with involvement of the right lentiform nucleus. 6. No acute hemorrhage. 7. Diffuse  distal small vessel disease. 8. Tortuosity of the cervical internal carotid arteries bilaterally. This is nonspecific, but often seen in the setting of longstanding hypertension. These results were  called by telephone at the time of interpretation on 07/15/2019 at 12:50 pm to provider PRAMOD [facility] , who verbally acknowledged these results. Electronically Signed   By: San Morelle M.D.   On: 07/15/2019 12:53   Ct Head Code Stroke Wo Contrast  Result Date: 07/15/2019 CLINICAL DATA:  Code stroke. Possible stroke. Small focal neuro deficit for less than 6 hours. Last known well at 5 a.m. Fall today. EXAM: CT HEAD WITHOUT CONTRAST TECHNIQUE: Contiguous axial images were obtained from the base of the skull through the vertex without intravenous contrast. COMPARISON:  CT head without contrast 07/15/2019 FINDINGS: Brain: The right lentiform nucleus is asymmetrically hypo dense compared to the right. Insular ribbon is preserved. Basal ganglia are otherwise normal. No other focal cortical lesions are present. The brainstem and cerebellum are within normal limits. Vascular: Hyperdense right MCA is present with extension into the M2 branches. No significant vascular calcifications are present. Calvarium is intact. No focal lytic or blastic lesions are present. Skull: Calvarium is intact. No focal lytic or blastic lesions are present. Sinuses/Orbits: The paranasal sinuses and mastoid air cells are clear. The globes and orbits are within normal limits. ASPECTS [facility] Stroke Program Early CT Score) - Ganglionic level infarction (caudate, lentiform nuclei, internal capsule, insula, M1-M3 cortex): 6/7 - Supraganglionic infarction (M4-M6 cortex): 3/3 Total score (0-10 with 10 being normal): 9/10 IMPRESSION: 1. Acute non hemorrhagic infarct involving the right basal ganglia with hypoattenuation of the right lentiform nucleus. 2. Hyperdense right MCA suggesting thrombus. 3. ASPECTS is 9/10 These results were called by telephone at the time of interpretation on 07/15/2019 at 12:24 pm to provider Roland Rack, who verbally acknowledged these results. Electronically Signed   By: San Morelle M.D.   On:  07/15/2019 12:26    Labs:  CBC: Recent Labs    07/15/19 1225 07/15/19 1233 07/15/19 1714 07/15/19 1944 07/16/19 0450  WBC 10.7*  --   --   --  15.0*  HGB 11.9* 13.6 11.9* 10.5* 9.3*  HCT 39.9 40.0 35.0* 31.0* 30.8*  PLT 315  --   --   --  399    COAGS: Recent Labs    07/15/19 1225  INR QUESTIONABLE RESULTS, RECOMMEND RECOLLECT TO VERIFY  APTT QUESTIONABLE RESULTS, RECOMMEND RECOLLECT TO VERIFY    BMP: Recent Labs    07/15/19 1225 07/15/19 1233 07/15/19 1714 07/15/19 1944 07/16/19 0450  NA 135 133* 138 135 135  K 4.7 4.7 4.0 3.9 3.5  CL 100 102  --   --  102  CO2 20*  --   --   --  22  GLUCOSE 235* 241*  --   --  184*  BUN 16 21*  --   --  17  CALCIUM 9.4  --   --   --  8.3*  CREATININE 0.75 0.50  --   --  0.82  GFRNONAA >60  --   --   --  >60  GFRAA >60  --   --   --  >60    LIVER FUNCTION TESTS: Recent Labs    07/15/19 1225  BILITOT 0.7  AST 58*  ALT 50*  ALKPHOS 115  PROT 7.0  ALBUMIN 3.8    Assessment and Plan:  CVA Rt MCA revascularization Rt ICA dissection-- flow diverter stent placement Will follow Plan  per Stroke team  Electronically Signed: Lavonia Drafts, PA-C 07/16/2019, 8:43 AM   I spent a total of 15 Minutes at the the patient's bedside AND on the patient's hospital floor or unit, greater than 50% of which was counseling/coordinating care for R MCA revascularization

## 2019-07-16 NOTE — Progress Notes (Signed)
OT Cancellation Note  Patient Details Name: Kathryn Coffey MRN: [id number] DOB: 1971-03-09   Cancelled Treatment:    Reason Eval/Treat Not Completed: Medical issues which prohibited therapy(Pt on bedrest and still has sheath. Pt at [place] for afternoon.)   Darryl Nestle) Marsa Aris OTR/L Acute Rehabilitation Services Pager: [phone] Office: [place] 07/16/2019, 4:26 PM

## 2019-07-16 NOTE — Progress Notes (Signed)
SLP Cancellation Note  Patient Details Name: Kathryn Coffey MRN: [id number] DOB: Dec 26, 1970   Cancelled treatment:       Reason Eval/Treat Not Completed: Medical issues which prohibited therapy. Intubated. Will follow for speech-language-cognitive assessment.    [place] Siren 07/16/2019, 11:12 AM    Orbie Pyo Colvin Caroli.Ed Risk analyst [phone] Office [phone]

## 2019-07-16 NOTE — Progress Notes (Signed)
Inpatient Diabetes Program Recommendations  AACE/ADA: New Consensus Statement on Inpatient Glycemic Control (2015)  Target Ranges:  Prepandial:   less than 140 mg/dL      Peak postprandial:   less than 180 mg/dL (1-2 hours)      Critically ill patients:  140 - 180 mg/dL   Lab Results  Component Value Date   GLUCAP 134 (H) 07/16/2019   HGBA1C 8.0 (H) 07/16/2019    Review of Glycemic Control Results for Kathryn Coffey, Kathryn Coffey (MRN [id number]) as of 07/16/2019 10:29  Ref. Range 07/15/2019 23:36 07/16/2019 03:36 07/16/2019 08:08  Glucose-Capillary Latest Ref Range: 70 - 99 mg/dL 202 (H) 202 (H) 134 (H)   Diabetes history: DM 2 Outpatient Diabetes medications:  Victoza 1.2 mg daily, Metformin 500 mg bid Current orders for Inpatient glycemic control:  Novolog resistant q 4 hours  Inpatient Diabetes Program Recommendations:   May consider ICU glycemic control order set and weight based Levemir.  Will follow.   Thanks  Adah Perl, RN, BC-ADM Inpatient Diabetes Coordinator Pager [phone]

## 2019-07-16 NOTE — Progress Notes (Signed)
Pt transported to CT on vent without complication  

## 2019-07-16 NOTE — Evaluation (Signed)
Physical Therapy Evaluation Patient Details Name: Kathryn Coffey MRN: [id number] DOB: 01-16-1971 Today's Date: 07/16/2019   History of Present Illness  pt is a 48 y/o female with h/o DM, HTN and obesity, admitted with L hemiplegia  Clinical Impression  Pt admitted with/for L hemiplegia.  Presently, on eval still sith significant L sided plegia needing max to total assist for basic mobility. .  Pt currently limited functionally due to the problems listed. ( See problems list.)   Pt will benefit from PT to maximize function and safety in order to get ready for next venue listed below.     Follow Up Recommendations CIR;Supervision/Assistance - 24 hour    Equipment Recommendations  Other (comment)(TBA)    Recommendations for Other Services Rehab consult     Precautions / Restrictions Precautions Precautions: Fall      Mobility  Bed Mobility Overal bed mobility: Needs Assistance Bed Mobility: Rolling;Sidelying to Sit;Sit to Supine Rolling: Total assist Sidelying to sit: Max assist   Sit to supine: Total assist   General bed mobility comments: 2 person not available, but necessary  Transfers                 General transfer comment: deferred today due to just back from CT  Ambulation/Gait             General Gait Details: NT  Stairs            Wheelchair Mobility    Modified Rankin (Stroke Patients Only) Modified Rankin (Stroke Patients Only) Pre-Morbid Rankin Score: No symptoms Modified Rankin: Severe disability     Balance Overall balance assessment: Needs assistance Sitting-balance support: Feet supported;Single extremity supported;No upper extremity supported Sitting balance-Leahy Scale: Poor Sitting balance - Comments: needing her R UE assist or external assist                                     Pertinent Vitals/Pain Pain Assessment: No/denies pain Pain Score: 0-No pain Pain Location: usually has some sciatica, but  not presently hurting. Pain Intervention(s): Monitored during session    Home Living Family/patient expects to be discharged to:: Private residence Living Arrangements: Spouse/significant other Available Help at Discharge: Family;Available PRN/intermittently;Available 24 hours/day;Other (Comment)(husband feels his employer will give him the time ) Type of Home: House Home Access: Stairs to enter Entrance Stairs-Rails: None(on the most used stairs) Technical brewer of Steps: 2-4 Home Layout: One level Home Equipment: Crutches      Prior Function Level of Independence: Independent               Hand Dominance   Dominant Hand: Right    Extremity/Trunk Assessment   Upper Extremity Assessment Upper Extremity Assessment: RUE deficits/detail;LUE deficits/detail RUE Deficits / Details: movement WFL, used to help in sitting and boost up in bed. LUE Deficits / Details: trace movement in add/flex and ext.  elbow flexion. LUE Coordination: decreased fine motor;decreased gross motor    Lower Extremity Assessment Lower Extremity Assessment: LLE deficits/detail;RLE deficits/detail RLE Deficits / Details: WFL, but with proximal weaknesses/hip flexion LLE Deficits / Details: 1-2/5 gross extention, trace gross flexion, 1/5 pf, 0/5 df. LLE Coordination: decreased fine motor;decreased gross motor       Communication   Communication: No difficulties  Cognition Arousal/Alertness: Lethargic;Awake/alert Behavior During Therapy: WFL for tasks assessed/performed;Flat affect Overall Cognitive Status: (NT formally, answered questions appropriately)  General Comments General comments (skin integrity, edema, etc.): vss    Exercises     Assessment/Plan    PT Assessment Patient needs continued PT services  PT Problem List Decreased strength;Decreased activity tolerance;Decreased balance;Decreased mobility;Decreased  coordination;Impaired sensation       PT Treatment Interventions DME instruction;Gait training;Functional mobility training;Therapeutic activities;Balance training;Neuromuscular re-education;Patient/family education    PT Goals (Current goals can be found in the Care Plan section)  Acute Rehab PT Goals Patient Stated Goal: back to independence PT Goal Formulation: With patient/family Time For Goal Achievement: 07/30/19 Potential to Achieve Goals: Fair    Frequency Min 4X/week   Barriers to discharge        Co-evaluation               AM-PAC PT "6 Clicks" Mobility  Outcome Measure Help needed turning from your back to your side while in a flat bed without using bedrails?: Total Help needed moving from lying on your back to sitting on the side of a flat bed without using bedrails?: Total Help needed moving to and from a bed to a chair (including a wheelchair)?: Total Help needed standing up from a chair using your arms (e.g., wheelchair or bedside chair)?: Total Help needed to walk in hospital room?: Total Help needed climbing 3-5 steps with a railing? : Total 6 Click Score: 6    End of Session   Activity Tolerance: Patient tolerated treatment well;Patient limited by fatigue Patient left: in bed;with call bell/phone within reach;with bed alarm set;with family/visitor present Nurse Communication: Mobility status PT Visit Diagnosis: Muscle weakness (generalized) (M62.81);Hemiplegia and hemiparesis Hemiplegia - Right/Left: Left Hemiplegia - dominant/non-dominant: Non-dominant Hemiplegia - caused by: Cerebral infarction    Time: [id number] PT Time Calculation (min) (ACUTE ONLY): 26 min   Charges:   PT Evaluation $PT Eval Moderate Complexity: 1 Mod PT Treatments $Therapeutic Activity: 8-22 mins        07/16/2019  Donnella Sham, PT Acute Rehabilitation Services [phone]  (pager) [phone]  (office)  Kathryn Coffey 07/16/2019, 5:18 PM

## 2019-07-16 NOTE — Consult Note (Signed)
NAME:  Kathryn Coffey, MRN:  [id number], DOB:  June 09, 1971, LOS: 1 ADMISSION DATE:  07/15/2019, CONSULTATION DATE:  11/24 REFERRING MD:  Dr. Leonel Ramsay, CHIEF COMPLAINT:  CVA   Brief History   48 year old female admitted 11/24 with CVA and taken to neuro IR for repair. Post operatively she remained on the ventilator and was transferred to ICU for recovery.   History of present illness   Patient is encephalopathic and/or intubated. Therefore history has been obtained from chart review.  48 year old female with PMH as below, which is significant for DM, GERD, HTN, IBS, and PCOS. She presented to [facility] ED in the morning hours of 11/24 with complaints headache and left sided weakness. She was last seen normal around 5 AM that morning. Then around [place] she suffered a fall, but refused EMS transport at that time. Later, she developed headache and left sided weakness causing EMS call. She was treated as a code stroke and underwent CT head immediately upon arrival the ED. CT showed acute infarct of right basal ganglia and hyperdense R MCA. CT angiogram demonstrated right ICA occlusion. CTP demonstrated ischemic penumbra and he was taken to neuro IR for revascularization. Had Rt MCA revascularization and repair for R ICU occlusive dissection. Post-operatively she was sent to the ICU on ventilator for recovery.   Past Medical History   has a past medical history of Abdominal wall cellulitis (2013 and 2014), Asthma, B12 deficiency, Complication of anesthesia, Costochondritis, Diabetes mellitus without complication ([place]), Gastric anomaly, GERD (gastroesophageal reflux disease), Herpes (06/2018), History of abnormal cervical Pap smear, History of Clostridium difficile colitis, History of hiatal hernia, History of kidney stones, Hypertension, IBS (irritable bowel syndrome), Migraine, Morbid obesity ([place]), PCOS (polycystic ovarian syndrome), and PONV (postoperative nausea and vomiting).   Significant Hospital  Events   11/24: admit with CVA, to neuro IR for MCA/ICA revascularization on vent.   Consults:  IR PCCM  Procedures:  ETT 11/24 >  Significant Diagnostic Tests:  CT head 11/24 > acute nonhemorrhagic infarct of the right basal ganglia and hypo attenuation o th e R lenitform nucleus. Hyperdense R MCA.  CTA head/neck 11/24 >  Occluded R ICA, some collateral circulation to r MCA and m1 vessels.  CTP 11/24 > 142 mL of ischemic penumbra involving most of the R MCA territory.  Echo 11/24> normal LV function, Gr3 DD. Micro Data:  COVID 11/24 > neg  Antimicrobials:  none  Interim history/subjective:    Objective   Blood pressure 132/79, pulse 67, temperature 98.7 F (37.1 C), temperature source Axillary, resp. rate 18, height 5\' 5"  (1.651 m), weight 124.4 kg, SpO2 100 %.    Vent Mode: PRVC FiO2 (%):  [40 %] 40 % Set Rate:  [16 bmp-22 bmp] 22 bmp Vt Set:  [450 mL] 450 mL PEEP:  [5 cmH20] 5 cmH20 Plateau Pressure:  [11 cmH20-15 cmH20] 11 cmH20   Intake/Output Summary (Last 24 hours) at 07/16/2019 1009 Last data filed at 07/16/2019 1000 Gross per 24 hour  Intake 1831.74 ml  Output 1795 ml  Net 36.74 ml   Filed Weights   07/15/19 1252 07/15/19 1630  Weight: 122.5 kg 124.4 kg    Examination: General: obese middle aged female in NAD on vent HENT: [place]/AT, PERRL, no JVD Lungs: Clear bilateral breath sounds, overbreathing ventilator Cardiovascular: Tachy, regular, no MRG Abdomen: Soft, non-distended Extremities: No acute deformity. Moving RUE spontaneously Neuro: Sedated, moves right side purposefully, extension to pain on LUE, flexion/withdrawal LLE  Resolved Hospital Problem list     Assessment & Plan:   Critically ill due to acute hypoxic respiratory failure requiring mechanical ventilation. Acute R MCA CVA with R ICA dissection - s/p revascularization of revascularization and repair of dissection. Type 2 DM HTN HSV  For follow up CT this morning. Then SBT and  interrupt sedation in view of extubation.  Daily Goals Checklist  Pain/Anxiety/Delirium protocol (if indicated): propofol infusion RASS -1 VAP protocol (if indicated): bundle in place Respiratory support goals: SBT extubation today. Blood pressure target: 120-160. At goal off infusions. Remove arterial line. DVT prophylaxis: UFH tid if no hemorrhagic conversion. Nutritional status and feeding goals: swallow evaluation post extubation. Low nutritional risk. GI prophylaxis: non required. Fluid status goals: allow autoregulation. Urinary catheter: remove post extubation. Central lines: remove arterial line Glucose control: euglycemic on SSI. Mobility/therapy needs: PT/OT once extubated. Antibiotic de-escalation: none Home medication reconciliation: will reconcile once extubated. Daily labs: BMP to follow creatinine following contrast load. Code Status: FULL Family Communication: per neurology. Disposition: ICU   Labs   CBC: Recent Labs  Lab 07/15/19 1225 07/15/19 1233 07/15/19 1714 07/15/19 1944 07/16/19 0450  WBC 10.7*  --   --   --  15.0*  NEUTROABS 9.6*  --   --   --  12.9*  HGB 11.9* 13.6 11.9* 10.5* 9.3*  HCT 39.9 40.0 35.0* 31.0* 30.8*  MCV 78.1*  --   --   --  78.6*  PLT 315  --   --   --  [id number]    Basic Metabolic Panel: Recent Labs  Lab 07/15/19 1225 07/15/19 1233 07/15/19 1714 07/15/19 1944 07/16/19 0450  NA 135 133* 138 135 135  K 4.7 4.7 4.0 3.9 3.5  CL 100 102  --   --  102  CO2 20*  --   --   --  22  GLUCOSE 235* 241*  --   --  184*  BUN 16 21*  --   --  17  CREATININE 0.75 0.50  --   --  0.82  CALCIUM 9.4  --   --   --  8.3*   GFR: Estimated Creatinine Clearance: 113.7 mL/min (by C-G formula based on SCr of 0.82 mg/dL). Recent Labs  Lab 07/15/19 1225 07/16/19 0450  WBC 10.7* 15.0*    Liver Function Tests: Recent Labs  Lab 07/15/19 1225  AST 58*  ALT 50*  ALKPHOS 115  BILITOT 0.7  PROT 7.0  ALBUMIN 3.8   No results for input(s):  LIPASE, AMYLASE in the last 168 hours. No results for input(s): AMMONIA in the last 168 hours.  ABG    Component Value Date/Time   PHART 7.369 07/15/2019 1944   PCO2ART 37.0 07/15/2019 1944   PO2ART 123.0 (H) 07/15/2019 1944   HCO3 21.3 07/15/2019 1944   TCO2 22 07/15/2019 1944   ACIDBASEDEF 4.0 (H) 07/15/2019 1944   O2SAT 99.0 07/15/2019 1944     Coagulation Profile: Recent Labs  Lab 07/15/19 1225  INR QUESTIONABLE RESULTS, RECOMMEND RECOLLECT TO VERIFY    Cardiac Enzymes: No results for input(s): CKTOTAL, CKMB, CKMBINDEX, TROPONINI in the last 168 hours.  HbA1C: Hgb A1c MFr Bld  Date/Time Value Ref Range Status  07/16/2019 04:50 AM 8.0 (H) 4.8 - 5.6 % Final    Comment:    (NOTE) Pre diabetes:          5.7%-6.4% Diabetes:              >6.4% Glycemic control  for   <7.0% adults with diabetes   07/15/2019 05:12 PM 8.1 (H) 4.8 - 5.6 % Final    Comment:    (NOTE) Pre diabetes:          5.7%-6.4% Diabetes:              >6.4% Glycemic control for   <7.0% adults with diabetes     CBG: Recent Labs  Lab 07/15/19 1705 07/15/19 1951 07/15/19 2336 07/16/19 0336 07/16/19 0808  GLUCAP 240* 241* 202* 202* 134*    CRITICAL CARE Performed by: Kipp Brood   Total critical care time: 45 minutes  Critical care time was exclusive of separately billable procedures and treating other patients.  Critical care was necessary to treat or prevent imminent or life-threatening deterioration.  Critical care was time spent personally by me on the following activities: development of treatment plan with patient and/or surrogate as well as nursing, discussions with consultants, evaluation of patient's response to treatment, examination of patient, obtaining history from patient or surrogate, ordering and performing treatments and interventions, ordering and review of laboratory studies, ordering and review of radiographic studies, pulse oximetry, re-evaluation of patient's condition  and participation in multidisciplinary rounds.  Kipp Brood, MD [facility] Central ICU Physician [place]  Pager: [phone] Mobile: [phone] After hours: [phone].   07/16/2019 10:09 AM

## 2019-07-17 ENCOUNTER — Inpatient Hospital Stay (HOSPITAL_COMMUNITY): Payer: No Typology Code available for payment source

## 2019-07-17 DIAGNOSIS — I6601 Occlusion and stenosis of right middle cerebral artery: Secondary | ICD-10-CM

## 2019-07-17 DIAGNOSIS — I639 Cerebral infarction, unspecified: Secondary | ICD-10-CM

## 2019-07-17 LAB — GLUCOSE, CAPILLARY
Glucose-Capillary: 129 mg/dL — ABNORMAL HIGH (ref 70–99)
Glucose-Capillary: 130 mg/dL — ABNORMAL HIGH (ref 70–99)
Glucose-Capillary: 130 mg/dL — ABNORMAL HIGH (ref 70–99)
Glucose-Capillary: 133 mg/dL — ABNORMAL HIGH (ref 70–99)
Glucose-Capillary: 136 mg/dL — ABNORMAL HIGH (ref 70–99)
Glucose-Capillary: 153 mg/dL — ABNORMAL HIGH (ref 70–99)
Glucose-Capillary: 174 mg/dL — ABNORMAL HIGH (ref 70–99)

## 2019-07-17 MED ORDER — TRIAMTERENE-HCTZ 37.5-25 MG PO TABS
1.0000 | ORAL_TABLET | Freq: Every day | ORAL | Status: DC
  Administered 2019-07-17: 1 via ORAL
  Filled 2019-07-17: qty 1

## 2019-07-17 MED ORDER — INSULIN DETEMIR 100 UNIT/ML ~~LOC~~ SOLN
10.0000 [IU] | Freq: Every day | SUBCUTANEOUS | Status: DC
  Administered 2019-07-17 – 2019-07-21 (×5): 10 [IU] via SUBCUTANEOUS
  Filled 2019-07-17 (×7): qty 0.1

## 2019-07-17 MED ORDER — ASPIRIN 81 MG PO CHEW
81.0000 mg | CHEWABLE_TABLET | Freq: Every day | ORAL | Status: DC
  Administered 2019-07-17 – 2019-07-22 (×6): 81 mg via ORAL
  Filled 2019-07-17 (×5): qty 1

## 2019-07-17 MED ORDER — TRAMADOL HCL 50 MG PO TABS
50.0000 mg | ORAL_TABLET | Freq: Once | ORAL | Status: AC
  Administered 2019-07-17: 50 mg via ORAL
  Filled 2019-07-17: qty 1

## 2019-07-17 NOTE — Progress Notes (Signed)
NAME:  Kathryn Coffey, MRN:  [id number], DOB:  [birth year], LOS: 2 ADMISSION DATE:  07/15/2019, CONSULTATION DATE: 11/24 REFERRING MD: Dr. Leonel Ramsay, CHIEF COMPLAINT: CVA  Brief History   48 year old female admitted 11/24 with CVA and taken to neuro IR for repair. Post operatively she remained on the ventilator and was transferred to ICU for recovery.   History of present illness   Patient is encephalopathic and/or intubated. Therefore history has been obtained from chart review.  48 year old female with PMH as below, which is significant for DM, GERD, HTN, IBS, and PCOS. She presented to [facility] ED in the morning hours of 11/24 with complaints headache and left sided weakness. She was last seen normal around 5 AM that morning. Then around [place] she suffered a fall, but refused EMS transport at that time. Later, she developed headache and left sided weakness causing EMS call. She was treated as a code stroke and underwent CT head immediately upon arrival the ED. CT showed acute infarct of right basal ganglia and hyperdense R MCA. CT angiogram demonstrated right ICA occlusion. CTP demonstrated ischemic penumbra and he was taken to neuro IR for revascularization. Had Rt MCA revascularization and repair for R ICU occlusive dissection. Post-operatively she was sent to the ICU on ventilator for recovery.   Past Medical History   Past Medical History:  Diagnosis Date  . Abdominal wall cellulitis 2013 and 2014  . Asthma    allergic to grasses  . B12 deficiency   . Complication of anesthesia   . Costochondritis   . Diabetes mellitus without complication ([place])   . Gastric anomaly    multiple small ulcers  . GERD (gastroesophageal reflux disease)   . Herpes 06/2018   POSSIBLY IN EYE- PT TAKING VALTREX AND WILL SEE OPTHAMOLOGIST TO SEE IF VALTREX IS WORKING  . History of abnormal cervical Pap smear   . History of Clostridium difficile colitis   . History of hiatal hernia   . History of kidney  stones   . Hypertension   . IBS (irritable bowel syndrome)   . Migraine   . Morbid obesity ([place])    s/p attempted gastric banding now decompressed  . PCOS (polycystic ovarian syndrome)   . PONV (postoperative nausea and vomiting)    [place] Hospital Events   11/24: admit with CVA, to neuro IR for MCA/ICA revascularization on vent.   Consults:  IR PCCM  Procedures:  ETT 11/24-11/25  Significant Diagnostic Tests:  CT head 11/24 > acute nonhemorrhagic infarct of the right basal ganglia and hypo attenuation o th e R lenitform nucleus. Hyperdense R MCA.  CTA head/neck 11/24 >  Occluded R ICA, some collateral circulation to r MCA and m1 vessels.  CTP 11/24 > 142 mL of ischemic penumbra involving most of the R MCA territory.  Echo 11/24> normal LV function, Gr3 DD.  Micro Data:  Covid -11/24  Antimicrobials:  None  Interim history/subjective:  Extubated 11/24 On room air and doing well  Objective   Blood pressure 114/76, pulse 74, temperature 98.6 F (37 C), temperature source Oral, resp. rate 19, height 5\' 5"  (1.651 m), weight 124.4 kg, SpO2 100 %.    Vent Mode: PSV;CPAP FiO2 (%):  [40 %] 40 % Set Rate:  [22 bmp] 22 bmp Vt Set:  [450 mL] 450 mL PEEP:  [5 cmH20] 5 cmH20 Pressure Support:  [10 cmH20] 10 cmH20 Plateau Pressure:  [11 cmH20] 11 cmH20  Intake/Output Summary (Last 24 hours) at 07/17/2019 0757 Last data filed at 07/17/2019 0700 Gross per 24 hour  Intake 1793.92 ml  Output 2000 ml  Net -206.08 ml   Filed Weights   07/15/19 1252 07/15/19 1630  Weight: 122.5 kg 124.4 kg    Examination: General: Middle-aged, obese, not in distress HENT: Pupils equal and reacting, no JVD Lungs: Clear breath sounds bilaterally Cardiovascular: S1-S2 appreciated Abdomen: Soft, nontender Extremities: No clubbing/edema Neuro: Alert and oriented GU:   Resolved Hospital Problem list   Respiratory failure  Assessment & Plan:  Acute right MCA  CVA with right ICA dissection -S/p endovascular repair -Status post thrombectomy  Respiratory failure -Resolved  Hypertension -Continue home meds -BP control  Diabetes -SSI   Discussed with Dr. Leonie Man at bedside PCCM will sign off at present, call as needed

## 2019-07-17 NOTE — Progress Notes (Signed)
STROKE TEAM PROGRESS NOTE   INTERVAL HISTORY Patient is awake alert and interactive.  She has still some right gaze preference but can able to look to the left past midline.  She still has left hemiparesis but is now able to move left upper extremity slightly and left lower extremity better even against gravity.  Blood pressure adequately controlled.  MRI scan is pending for later this morning.   Vitals:   07/17/19 0500 07/17/19 0600 07/17/19 0700 07/17/19 0800  BP: 121/69 107/70 114/76   Pulse: 79 84 74   Resp: 18 17 19    Temp:    98.7 F (37.1 C)  TempSrc:    Oral  SpO2: 97% 98% 100%   Weight:      Height:        CBC:  Recent Labs  Lab 07/15/19 1225  07/15/19 1944 07/16/19 0450  WBC 10.7*  --   --  15.0*  NEUTROABS 9.6*  --   --  12.9*  HGB 11.9*   < > 10.5* 9.3*  HCT 39.9   < > 31.0* 30.8*  MCV 78.1*  --   --  78.6*  PLT 315  --   --  399   < > = values in this interval not displayed.    Basic Metabolic Panel:  Recent Labs  Lab 07/15/19 1225 07/15/19 1233  07/15/19 1944 07/16/19 0450  NA 135 133*   < > 135 135  K 4.7 4.7   < > 3.9 3.5  CL 100 102  --   --  102  CO2 20*  --   --   --  22  GLUCOSE 235* 241*  --   --  184*  BUN 16 21*  --   --  17  CREATININE 0.75 0.50  --   --  0.82  CALCIUM 9.4  --   --   --  8.3*   < > = values in this interval not displayed.   Lipid Panel:     Component Value Date/Time   CHOL 129 07/16/2019 0450   TRIG 309 (H) 07/16/2019 0450   TRIG 321 (H) 07/16/2019 0450   HDL 26 (L) 07/16/2019 0450   CHOLHDL 5.0 07/16/2019 0450   VLDL 62 (H) 07/16/2019 0450   LDLCALC 41 07/16/2019 0450   HgbA1c:  Lab Results  Component Value Date   HGBA1C 8.0 (H) 07/16/2019   Urine Drug Screen: No results found for: LABOPIA, COCAINSCRNUR, LABBENZ, AMPHETMU, THCU, LABBARB  Alcohol Level No results found for: [facility]  IMAGING MRI head 07/17/2019 Acute infarct in the right basal ganglia with hemorrhagic transformation in the right caudate and  right lenticular nucleus. Acute infarct also extends into the insula and right temporal lobe. Small acute infarct in the right frontal lobe. Occluded right internal carotid artery.   Ct Head Wo Contrast 07/16/2019 Evolving recent infarction involving the right basal ganglia and adjacent white matter, posterior insula, and superior right temporal lobe. No acute intracranial hemorrhage or significant mass effect.   Ct Angio Head W Or Wo Contrast Ct Angio Neck W Or Wo Contrast Ct Cerebral Perfusion W Contrast 07/16/2019 1. CT perfusion negative for acute infarct or ischemia. Interval normalization of infarct in the right basal ganglia with ischemia in the right MCA territory on CT perfusion yesterday. 2. Reocclusion of right internal carotid artery near the origin. Right internal carotid artery stent is occluded. Right MCA now widely patent. 3. Left carotid and both vertebral arteries widely patent.  Ct Angio Head W Or Wo Contrast Ct Angio Neck W Or Wo Contrast Ct Cerebral Perfusion W Contrast 07/15/2019 1. Occluded right internal carotid artery without reconstitution at the neck or skull base. 2. There is some flow into the proximal right M1 segment via a patent anterior communicating artery and right A1 segment. 3. There is some collateral position of right MCA branch vessels. 4. CT perfusion study demonstrates in ischemic penumbra involving most of the right MCA territory with a large mismatch volume of 142 mL. 5. Core infarct corresponds to the noncontrast CT of the head with involvement of the right lentiform nucleus. 6. No acute hemorrhage. 7. Diffuse distal small vessel disease. 8. Tortuosity of the cervical internal carotid arteries bilaterally. This is nonspecific, but often seen in the setting of longstanding hypertension.   Ct Head Wo Contrast 07/15/2019 No acute hemorrhage.   Ir Percutaneous Art Thrombectomy/infusion Intracranial Inc Diag Angio 07/15/2019 1. Occluded right internal  carotid artery without reconstitution at the neck or skull base. 2. There is some flow into the proximal right M1 segment via a patent anterior communicating artery and right A1 segment. 3. There is some collateral position of right MCA branch vessels. 4. CT perfusion study demonstrates in ischemic penumbra involving most of the right MCA territory with a large mismatch volume of 142 mL. 5. Core infarct corresponds to the noncontrast CT of the head with involvement of the right lentiform nucleus. 6. No acute hemorrhage. 7. Diffuse distal small vessel disease. 8. Tortuosity of the cervical internal carotid arteries bilaterally. This is nonspecific, but often seen in the setting of longstanding hypertension.   Ct Head Code Stroke Wo Contrast 07/15/2019 1. Acute non hemorrhagic infarct involving the right basal ganglia with hypoattenuation of the right lentiform nucleus. 2. Hyperdense right MCA suggesting thrombus. 3. ASPECTS is 9/10   Cerebral Angiogram 07/15/2019 S/P revascularization of Rt MCA M 1 with x 1 pass with the 74mm x 68mm embotrap retriver device and  Penumbra aspiration achieving a TICI 3 revascularization.  Repair of RT ICA prox  occlusive dissection with  Flow diverter device.  2D Echocardiogram 1. Left ventricular ejection fraction, by visual estimation, is 60 to 65%. The left ventricle has normal function. There is no left ventricular hypertrophy.  2. Left ventricular diastolic parameters are consistent with Grade III diastolic dysfunction (restrictive).  3. The left ventricle has no regional wall motion abnormalities.  4. Global right ventricle has normal systolic function.The right ventricular size is normal. No increase in right ventricular wall thickness.  5. Left atrial size was normal.  6. Right atrial size was normal.  7. The mitral valve is normal in structure. No evidence of mitral valve regurgitation. No evidence of mitral stenosis.  8. The tricuspid valve is normal in  structure. Tricuspid valve regurgitation is not demonstrated.  9. The aortic valve is normal in structure. Aortic valve regurgitation is not visualized. No evidence of aortic valve sclerosis or stenosis. 10. The pulmonic valve was grossly normal. Pulmonic valve regurgitation is not visualized. 11. TR signal is inadequate for assessing pulmonary artery systolic pressure. 12. The inferior vena cava is normal in size with greater than 50% respiratory variability, suggesting right atrial pressure of 3 mmHg.  Portable Chest Xray 07/16/2019 1.  Endotracheal tube and NG tube in stable position. 2. Low lung volumes with mild bibasilar atelectasis again noted without interim change.  07/15/2019 Tube positions as described without pneumothorax. Mild bibasilar atelectasis. No edema or consolidation. Heart upper normal in  size.    PHYSICAL EXAM   Obese middle-aged Caucasian lady who is not in distress.. Afebrile. Head is nontraumatic. Neck is supple without bruit.    Cardiac exam no murmur or gallop. Lungs are clear to auscultation. Distal pulses are well felt. Neurological Exam : Patient is awake alert and interactive.  She has right gaze preference but is able to look all the way to the left.  She blinks to threat bilaterally.  No visual field defect noted.  Moderate left lower facial weakness.  No dysarthria or aphasia.  Tongue midline.  Motor system exam shows left hemiparesis with left upper extremity grade 1-2/5 strength in left lower extremity 3/5 strength.  Sensation is intact bilaterally.  Tone is slightly diminished on the left.  Left plantar upgoing right downgoing.  Gait not tested.   NIHSS 7  ASSESSMENT/PLAN Ms. ARLISSA GRALL is a 48 y.o. female with history of morbid obesity, DB, HTN, migraine, asthma, GERD who fell at home and refused to go to the hospital. Later she o of HA and noted to be flaccid on her L side. EMS was called. Enrolled in the [employer]. Received tenecteplase at 1334 on  07/15/2019.  Stroke:   R basal ganglia/posterior insular/temporal lobe infarct w/ hemorrhagic transformation and R frontal lobe infarct s/p tenecteplase ([employer]) and IR w/ revascularization M1 and R ICA stent, now occluded - thrombembolic in setting of large vessel disease source  Code Stroke CT head R basal ganglia infarct w/ hypoattenuation R lentiform nucleus. Hyperdense R MCA. ASPECTS 9    CTA head & neck R ICA occlusion. Small vessel disease. Tortuous ICAs.   CT perfusion R PCA penumbra w/ 142 mL large mismatch. R lentiform nucleus core infarct.   Cerebra angio occluded R MCA M1 w/ TICI3 revascularization. Repair of R ICA proximal occlusive dissection w/ flow diverter  Post IR CT brain NO ICH or mass effect.  CT head no ICH  Repeat CTA head & neck reocclusion R ICA near origin. R ICA stent is occluded. R MCA patent. L ICA and B VA open.   Repeat CT perfusion no infarct. R basal ganglia normalization of infarct   Repeat CT head evolving R basal ganglia, posterior insular and R temporal lobe infarct.   MRI  R basal ganglia infarct w/ hemorrhagic transformation into R caudate and R lentiform nucleus. Infarct into insula and R temporal lobe. Small R frontal lobe infarct   2D Echo EF 60-65%. No source of embolus   LDL 41  HgbA1c 8.0  SCDs for VTE prophylaxis  No antithrombotic prior to admission, now on aspirin 81 mg daily and Brilinta (ticagrelor) 90 mg bid given stent placement.  Therapy recommendations:  CIR. consult placed  Disposition:  pending   Acute Respiratory Failure, resolved  Intubated, sedated  Left intubated with sheaths in  post IR  Extubated 11/25  Stable  CCM signed off  Carotid Occlusion   S/p stent, reoccluded post placement  Avoid low BP; Keep SBP goal 130-150 (no meds to increase BP)  Hypertension  Home meds:  Losartan-HCTZ 100-25, triamterene-HCTZ 34.5-25  Stable . Hold BP meds for now, monitor BP, avoid pressers. Hope activity will  elevate BP . SBP goal 130-150 given stent occlusion    Diabetes type II Uncontrolled  Home meds:  victoza 18, metformin 500 bid  HgbA1c 8.0, goal < 7.0  CBGs  SSI  Add levemir 10 u q hs   DB RN following  Dysphagia, resolved . Secondary to  stroke . Cleared for diet . 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 On valacyclovir 1000 tid  Acute blood loss anemia 11.9-10.5-9.3. monitor  Hospital day # 2 Plan continue mobilize out of bed.  Therapy and inpatient rehab consults.  Check MRI scan of the brain later today.  Recommend permissive hypertension given right carotid occlusion and hence we will not restart home blood pressure medications unless she becomes significantly hypertensive.  Discussed with patient, husband and critical care MD Dr. Ander Slade This patient is critically ill and at significant risk of neurological worsening, death and care requires constant monitoring of vital signs, hemodynamics,respiratory and cardiac monitoring, extensive review of multiple databases, frequent neurological assessment, discussion with family, other specialists and medical decision making of high complexity.I have made any additions or clarifications directly to the above note.This critical care time does not reflect procedure time, or teaching time or supervisory time of PA/NP/Med Resident etc but could involve care discussion time.  I spent 30 minutes of neurocritical care time  in the care of  this patient.     Antony Contras, MD Medical Director [facility] Pager: [phone] 07/17/2019 8:20 AM   To contact Stroke Continuity provider, please refer to [email or web address] After hours, contact General Neurology

## 2019-07-18 ENCOUNTER — Inpatient Hospital Stay (HOSPITAL_COMMUNITY): Payer: No Typology Code available for payment source

## 2019-07-18 LAB — BASIC METABOLIC PANEL
Anion gap: 8 (ref 5–15)
BUN: 13 mg/dL (ref 6–20)
CO2: 26 mmol/L (ref 22–32)
Calcium: 8.5 mg/dL — ABNORMAL LOW (ref 8.9–10.3)
Chloride: 106 mmol/L (ref 98–111)
Creatinine, Ser: 0.68 mg/dL (ref 0.44–1.00)
GFR calc Af Amer: >60 mL/min (ref >60)
GFR calc non Af Amer: >60 mL/min (ref >60)
Glucose, Bld: 151 mg/dL — ABNORMAL HIGH (ref 70–99)
Potassium: 3.6 mmol/L (ref 3.5–5.1)
Sodium: 140 mmol/L (ref 135–145)

## 2019-07-18 LAB — CBC
HCT: 28.4 % — ABNORMAL LOW (ref 36.0–46.0)
Hemoglobin: 8.8 g/dL — ABNORMAL LOW (ref 12.0–15.0)
MCH: 23.7 pg — ABNORMAL LOW (ref 26.0–34.0)
MCHC: 31 g/dL (ref 30.0–36.0)
MCV: 76.3 fL — ABNORMAL LOW (ref 80.0–100.0)
Platelets: 337 10*3/uL (ref 150–400)
RBC: 3.72 MIL/uL — ABNORMAL LOW (ref 3.87–5.11)
RDW: 17.5 % — ABNORMAL HIGH (ref 11.5–15.5)
WBC: 10.3 10*3/uL (ref 4.0–10.5)
nRBC: 0 % (ref 0.0–0.2)

## 2019-07-18 LAB — GLUCOSE, CAPILLARY
Glucose-Capillary: 132 mg/dL — ABNORMAL HIGH (ref 70–99)
Glucose-Capillary: 176 mg/dL — ABNORMAL HIGH (ref 70–99)
Glucose-Capillary: 163 mg/dL — ABNORMAL HIGH (ref 70–99)
Glucose-Capillary: 129 mg/dL — ABNORMAL HIGH (ref 70–99)
Glucose-Capillary: 154 mg/dL — ABNORMAL HIGH (ref 70–99)
Glucose-Capillary: 138 mg/dL — ABNORMAL HIGH (ref 70–99)

## 2019-07-18 MED ORDER — PANTOPRAZOLE SODIUM 40 MG PO TBEC
40.0000 mg | DELAYED_RELEASE_TABLET | Freq: Every day | ORAL | Status: DC
  Administered 2019-07-18 – 2019-07-21 (×4): 40 mg via ORAL
  Filled 2019-07-18 (×4): qty 1

## 2019-07-18 MED ORDER — TRAMADOL HCL 50 MG PO TABS
50.0000 mg | ORAL_TABLET | Freq: Four times a day (QID) | ORAL | Status: DC | PRN
  Administered 2019-07-18 – 2019-07-22 (×9): 50 mg via ORAL
  Filled 2019-07-18 (×9): qty 1

## 2019-07-18 NOTE — Evaluation (Signed)
Occupational Therapy Evaluation Patient Details Name: Kathryn Coffey MRN: [id number] DOB: 08-Jul-1971 Today's Date: 07/18/2019    History of Present Illness pt is a 48 y/o female admitted with left hemiplegia with right basal ganglia infarct. PMHx: DM, HTN and obesity   Clinical Impression   Pt PTA: Pt living home with family and independent. Pt currently limited by weakness in L side, inability to attend to L side, R gaze preference (getting better since early ER notes) and decreased ability to care for self. Pt with 1+to 2-/5 in shoulder elevation, retraction, elbow flex and grip strength with limited finger movements.  If directed, pt able to turn head to left/midline for task, but continues to have R gaze preference. Pt modA +2 for bed mobility to L side. Pt modA +2 for sit to stands and for +77modA for stand pivot; L knee requiring blocking to take steps to recliner with multimodal cues to attend to LUE. Pt opening containers and attending to L side of breakfast tray with attempts to open spice packages with BUEs, but LUE too weak. Pt greatly requires continued OT skilled services for ADL, mobility and progression of LUE. Pt appears to be very motivated and enthusiastic to return to PLOF. OT following acutely.     Follow Up Recommendations  CIR;Supervision/Assistance - 24 hour    Equipment Recommendations  3 in 1 bedside commode;Other (comment)(to be determined at next venue)    Recommendations for Other Services Rehab consult     Precautions / Restrictions Precautions Precautions: Fall Restrictions Weight Bearing Restrictions: No      Mobility Bed Mobility Overal bed mobility: Needs Assistance Bed Mobility: Rolling;Sidelying to Sit Rolling: Mod assist;+2 for physical assistance;+2 for safety/equipment Sidelying to sit: Mod assist;+2 for physical assistance;+2 for safety/equipment       General bed mobility comments: modA +2 overall RUE assisting with trunk elevation; cues  and assist for trunk elevation and for BLEs to stay off EOB.  Transfers Overall transfer level: Needs assistance Equipment used: 2 person hand held assist Transfers: Stand Pivot Transfers;Sit to/from Stand Sit to Stand: Mod assist;+2 physical assistance;+2 safety/equipment Stand pivot transfers: Mod assist;+2 physical assistance;+2 safety/equipment       General transfer comment: Pt modA +2 for sit to stands and for +61modA for stand pivot; L knee requiring blocking to take steps to recliner with multimodal cues.    Balance Overall balance assessment: Needs assistance Sitting-balance support: Feet supported;Single extremity supported;No upper extremity supported Sitting balance-Leahy Scale: Poor Sitting balance - Comments: needing her R UE assist or external assist Postural control: Posterior lean   Standing balance-Leahy Scale: Poor Standing balance comment: Pt requiring +2 for transfer with [facility] assist as pt not attending to LUE.                           ADL either performed or assessed with clinical judgement   ADL Overall ADL's : Needs assistance/impaired Eating/Feeding: Set up;Sitting   Grooming: Moderate assistance;Sitting Grooming Details (indicate cue type and reason): Pt fatigues easily and able to RUE, but nearly totalA for hadn over hand for LUE to comb wash face Upper Body Bathing: Moderate assistance;Sitting   Lower Body Bathing: Maximal assistance;Total assistance;Sitting/lateral leans;Sit to/from stand   Upper Body Dressing : Moderate assistance;Sitting   Lower Body Dressing: Maximal assistance;Sitting/lateral leans;Sit to/from stand;+2 for physical assistance;+2 for safety/equipment   Toilet Transfer: Moderate assistance;+2 for physical assistance;+2 for safety/equipment;Stand-pivot;Cueing for safety;Cueing for sequencing Toilet Transfer Details (  indicate cue type and reason): Pt taking a few steps with LLE knee blocked Toileting- Clothing  Manipulation and Hygiene: Maximal assistance;+2 for physical assistance;+2 for safety/equipment;Sitting/lateral lean;Sit to/from stand       Functional mobility during ADLs: Moderate assistance;+2 for physical assistance;+2 for safety/equipment;Cueing for safety;Cueing for sequencing General ADL Comments: Pt limited by weakness in L side, inability to attend to L side, R gaze preference (getting better since early ER notes) and decreased ability to care for self.     Vision Baseline Vision/History: No visual deficits Patient Visual Report: Other (comment)(Pt with R gaze preference, but easily guided to look L.) Vision Assessment?: Yes Eye Alignment: Within Functional Limits Ocular Range of Motion: Within Functional Limits Alignment/Gaze Preference: Gaze right Additional Comments: If directed, pt able to turn head to left/midline for task.     Perception     Praxis      Pertinent Vitals/Pain Pain Assessment: 0-10 Pain Score: 0-No pain Pain Intervention(s): Monitored during session     Hand Dominance Right   Extremity/Trunk Assessment Upper Extremity Assessment Upper Extremity Assessment: Generalized weakness;RUE deficits/detail;LUE deficits/detail RUE Deficits / Details: AROM, WFLs; strong LUE Deficits / Details: Pt with 1+to 2-/5 in shoulder elevation, retraction, elbow flex and grip strength with limited finger movements. LUE Coordination: decreased fine motor;decreased gross motor   Lower Extremity Assessment Lower Extremity Assessment: Defer to PT evaluation;Generalized weakness   Cervical / Trunk Assessment Cervical / Trunk Assessment: Normal   Communication Communication Communication: Expressive difficulties(slightly delayed speech)   Cognition Arousal/Alertness: Lethargic Behavior During Therapy: WFL for tasks assessed/performed;Flat affect Overall Cognitive Status: Difficult to assess                                 General Comments: Pt  answering all questions, but would take increaed time to answer and when in recliner would fling her head back and sigh with no reason stated   General Comments  Pt requiring cues to attend to LUE.  Pt completing x4 sit to stands and pt fatigues easily.    Exercises Exercises: Other exercises Other Exercises Other Exercises: LUE PROM/AAROM shoulder through digits   Shoulder Instructions      Home Living Family/patient expects to be discharged to:: Private residence Living Arrangements: Spouse/significant other Available Help at Discharge: Family;Available PRN/intermittently;Available 24 hours/day;Other (Comment) Type of Home: House Home Access: Stairs to enter [employer] of Steps: 2-4 Entrance Stairs-Rails: None Home Layout: One level     Bathroom Shower/Tub: Teacher, early years/pre: Standard     Home Equipment: Crutches          Prior Functioning/Environment Level of Independence: Independent                 OT Problem List: Decreased strength;Decreased activity tolerance;Impaired balance (sitting and/or standing);Impaired vision/perception;Decreased coordination;Decreased cognition;Decreased safety awareness;Impaired UE functional use;Increased edema      OT Treatment/Interventions: Self-care/ADL training;Therapeutic exercise;Neuromuscular education;Energy conservation;DME and/or AE instruction;Therapeutic activities;Visual/perceptual remediation/compensation;Patient/family education;Balance training;Cognitive remediation/compensation    OT Goals(Current goals can be found in the care plan section) Acute Rehab OT Goals Patient Stated Goal: back to independence OT Goal Formulation: With patient Time For Goal Achievement: 08/01/19 Potential to Achieve Goals: Good ADL Goals Pt Will Perform Eating: with modified independence;sitting Pt Will Perform Grooming: with min assist;sitting Pt Will Perform Upper Body Dressing: with min  assist;sitting Pt Will Perform Lower Body Dressing: with min assist;with adaptive equipment;sitting/lateral leans;sit to/from stand  Pt Will Transfer to Toilet: with min assist;stand pivot transfer;bedside commode Pt/caregiver will Perform Home Exercise Program: Left upper extremity;Increased strength;Increased ROM;With minimal assist;With theraputty  OT Frequency: Min 3X/week   Barriers to D/C:            Co-evaluation PT/OT/SLP Co-Evaluation/Treatment: Yes Reason for Co-Treatment: Complexity of the patient's impairments (multi-system involvement);To address functional/ADL transfers   OT goals addressed during session: ADL's and self-care      AM-PAC OT "6 Clicks" Daily Activity     Outcome Measure Help from another person eating meals?: A Little Help from another person taking care of personal grooming?: A Lot Help from another person toileting, which includes using toliet, bedpan, or urinal?: Total Help from another person bathing (including washing, rinsing, drying)?: A Lot Help from another person to put on and taking off regular upper body clothing?: A Lot Help from another person to put on and taking off regular lower body clothing?: Total 6 Click Score: 11   End of Session Equipment Utilized During Treatment: Gait belt Nurse Communication: Mobility status  Activity Tolerance: Patient tolerated treatment well Patient left: with call bell/phone within reach;in chair;with chair alarm set  OT Visit Diagnosis: Unsteadiness on feet (R26.81);Muscle weakness (generalized) (M62.81);Other symptoms and signs involving cognitive function;Hemiplegia and hemiparesis Hemiplegia - Right/Left: Left Hemiplegia - dominant/non-dominant: Non-Dominant Hemiplegia - caused by: Cerebral infarction                Time: [id number] OT Time Calculation (min): 27 min Charges:  OT General Charges $OT Visit: 1 Visit OT Evaluation $OT Eval Moderate Complexity: 1 Mod  Darryl Nestle) Marsa Aris  OTR/L Acute Rehabilitation Services Pager: [phone] Office: [place] 07/18/2019, 11:55 AM

## 2019-07-18 NOTE — PMR Pre-admission (Signed)
PMR Admission Coordinator Pre-Admission Assessment  Patient: Kathryn Coffey is an 48 y.o., female MRN: [id number] DOB: July 05, 1971 Height: _0  (165.1 cm) Weight: 124.4 kg  Insurance Information HMO: yes    PPO:      PCP:      IPA:      80/20:      OTHER:  PRIMARY: Kathryn Coffey Employee/[employer]      Policy#: [id number]      Subscriber: Patient CM Name: Kathryn Coffey      Phone#: [phone]     Fax#: [phone] Pre-Cert#: "temporary auth: [id number]"      Employer:  Kathryn Coffey provided by Kathryn Coffey (p) [phone] for CIR approval. Pt is approved for 4 days with clinical updates due on 12/4 to Kathryn Coffey (p): [phone] (f): [phone]. Per Kathryn Coffey, once those updated clinicals are sent we will receive permanent auth # via letter form.  Benefits:  Phone #: [phone]     Name:  Eff. Date: 08/21/2018 - 08/21/2019     Deduct: does not have ($0)      Out of Pocket Max: $2,500 ($322.00 met)      Life Max: NA CIR: $750/admission co-pay, then 100% coverage and 0% co-insurance if approved      SNF: 80% coverage, 20% co-insurance; limited to 120 days/cal yr and pre-cert requried Outpatient: no limitations, notification required     Co-Pay: $30/visit  Home Health: 80% coverage, 20% co-insurance, pre-cert required   DME: 80% coverage, 20% co-insurance, $500 co-pay required if DME rental is for longer than 2 months or DME purchase exceeds $500 dollars    Providers:  SECONDARY: None      Policy#:       Subscriber:  CM Name:       Phone#:      Fax#:  Pre-Cert#:       Employer:  Benefits:  Phone #:      Name:  Eff. Date:      Deduct:       Out of Pocket Max:       Life Max: CIR:       SNF:  Outpatient:      Co-Pay:  Home Health:       Co-Pay:  DME:      Co-Pay:   Medicaid Application Date:       Case Manager:  Disability Application Date:       Case Worker:   The "Data Collection Information Summary" for patients in Inpatient Rehabilitation Facilities with attached "Privacy Act [place] Records" was provided and verbally reviewed with: N/A  Emergency Contact Information Contact Information    Name Relation Home Work Mobile   Kathryn Coffey [phone]      Current Medical History  Patient Admitting Diagnosis: Right Basal Ganglia/posterior insular/temporal lobe infarct with hemorrhagic transformation and R frontal lobe infarct s/p tenecteplase and IR with revascularization and stent.   History of Present Illness: Kathryn Coffey is a 48 year old Rh-female with history of HTN, T2DM, morbid obesity, sinus issues/allergies, ocular herpes a year ago who was admitted on 07/15/19 with reports of all night prior to admission with onset of HA and developed flaccid Left hemiparesis a couple of hours later. CT head showed acute non-hemorrhageic infarct in right basal ganglia with hyperdense R-MCA signt. CTA/P head neck showed perfusion deficit with occluded right ICA and slow flow M!. She underwent cerebral angio with revascularization of R-MCA with T1C1 revascularization and repair of  R-ICA with flow diverter device. Post procedure CT  Head negative for bleed and repeat CTA head/neck showed reocclusion of R- ICA at origin with occlusion of R-ICA stent but now with patent R-MCA. 2 D echo showed normal EF with grade 3 diastolic dysfunction. She tolerated extubation 11/24 and follow up MRI brain 11/26 showed acute infarct in right basal ganglia with hemorrhagic transformation in right caudate and right lenticular nucleus extending into insula and right temporal lobe and small acute infarct in right frontal lobe and occluded R-ICA.  To continue ASA/Brillinta due to stent and Dr. Erlinda Coffey recommends 30 day event monitor after discharge. Long term BP goal 130-150 range given stent occlusion. During admission, pt has complained about right shoulder pain with reported history of scapular dyskinesis with ice and Tramadol being utilized currently. Patient with resultant left  sided weakness with left inattention affecting mobility and ADLs. CIR recommended due to functional decline. Pt is to admit to CIR on 07/22/2019.    Complete NIHSS TOTAL: 10  Patient's medical record from [facility] has been reviewed by the rehabilitation admission coordinator and physician.  Past Medical History  Past Medical History:  Diagnosis Date  . Abdominal wall cellulitis 2013 and 2014  . Asthma    allergic to grasses  . B12 deficiency   . Complication of anesthesia   . Costochondritis   . Diabetes mellitus without complication ([place])   . Gastric anomaly    multiple small ulcers  . GERD (gastroesophageal reflux disease)   . Herpes 06/2018   POSSIBLY IN EYE- PT TAKING VALTREX AND WILL SEE OPTHAMOLOGIST TO SEE IF VALTREX IS WORKING  . History of abnormal cervical Pap smear   . History of Clostridium difficile colitis   . History of hiatal hernia   . History of kidney stones   . Hypertension   . IBS (irritable bowel syndrome)   . Migraine   . Morbid obesity ([place])    s/p attempted gastric banding now decompressed  . PCOS (polycystic ovarian syndrome)   . PONV (postoperative nausea and vomiting)    WITH SPINAL ONLY    Family History   family history includes Breast cancer (age of onset: 82) in her maternal grandmother; Diabetes in her father and mother.  Prior Rehab/Hospitalizations Has the patient had prior rehab or hospitalizations prior to admission? No  Has the patient had major surgery during 100 days prior to admission? Yes   Current Medications  Current Facility-Administered Medications:  .  acetaminophen (TYLENOL) tablet 650 mg, 650 mg, Oral, Q4H PRN, 650 mg at 07/21/19 2209 **OR** acetaminophen (TYLENOL) 160 MG/5ML solution 650 mg, 650 mg, Per Tube, Q4H PRN **OR** acetaminophen (TYLENOL) suppository 650 mg, 650 mg, Rectal, Q4H PRN, Kathryn Fila, MD .  [DISCONTINUED] aspirin chewable tablet 81 mg, 81 mg, Oral, Daily **OR** aspirin  chewable tablet 81 mg, 81 mg, Oral, Daily, Kathryn Fila, MD, 81 mg at 07/22/19 0902 .  chlorhexidine (PERIDEX) 0.12 % solution 15 mL, 15 mL, Mouth Rinse, BID, Kathryn Fila, MD, 15 mL at 07/21/19 2211 .  Chlorhexidine Gluconate Cloth 2 % PADS 6 each, 6 each, Topical, Daily, Kathryn Fila, MD, 6 each at 07/22/19 1008 .  enoxaparin (LOVENOX) injection 40 mg, 40 mg, Subcutaneous, Q24H, Rosalin Hawking, MD, 40 mg at 07/21/19 1704 .  insulin aspart (novoLOG) injection 0-20 Units, 0-20 Units, Subcutaneous, TID WC & HS, Rosalin Hawking, MD, 4 Units at 07/22/19 1334 .  insulin detemir (LEVEMIR) injection 10 Units,  10 Units, Subcutaneous, QHS, Kathryn Fila, MD, 10 Units at 07/21/19 2211 .  ipratropium-albuterol (DUONEB) 0.5-2.5 (3) MG/3ML nebulizer solution 3 mL, 3 mL, Nebulization, Q6H PRN, Kathryn Fila, MD .  MEDLINE mouth rinse, 15 mL, Mouth Rinse, q12n4p, Kathryn Fila, MD, 15 mL at 07/22/19 1234 .  Melatonin TABS 6 mg, 6 mg, Oral, QHS, Rosalin Hawking, MD, 6 mg at 07/21/19 2210 .  ondansetron (ZOFRAN) injection 4 mg, 4 mg, Intravenous, Q6H PRN, Kathryn Fila, MD, 4 mg at 07/20/19 1354 .  oxyCODONE (Oxy IR/ROXICODONE) immediate release tablet 5 mg, 5 mg, Oral, Q6H PRN, Rosalin Hawking, MD, 5 mg at 07/22/19 1510 .  pantoprazole (PROTONIX) EC tablet 40 mg, 40 mg, Oral, QHS, Kathryn Fila, MD, 40 mg at 07/21/19 2211 .  senna-docusate (Senokot-S) tablet 1 tablet, 1 tablet, Oral, Once, Kathryn Fila, MD .  ticagrelor (BRILINTA) tablet 90 mg, 90 mg, Oral, BID, 90 mg at 07/22/19 0902 **OR** ticagrelor (BRILINTA) tablet 90 mg, 90 mg, Per Tube, BID, Kathryn Fila, MD, 90 mg at 07/16/19 1335 .  valACYclovir (VALTREX) tablet 1,000 mg, 1,000 mg, Oral, TID, Kathryn Fila, MD, 1,000 mg at 07/20/19 1828  Patients Current Diet:  Diet Order            Diet heart healthy/carb modified Room service appropriate? Yes; Fluid consistency: Thin  Diet effective now              Precautions /  Restrictions Precautions Precautions: Fall Precaution Comments: left hemiparesis Restrictions Weight Bearing Restrictions: No   Has the patient had 2 or more falls or a fall with injury in the past year? 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 drove PTA and was Independent  Prior Functional Level Self Care: Did the patient need help bathing, dressing, using the toilet or eating? Independent  Indoor Mobility: Did the patient need assistance with walking from room to room (with or without device)? Independent  Stairs: Did the patient need assistance with internal or external stairs (with or without device)? Independent  Functional Cognition: Did the patient need help planning regular tasks such as shopping or remembering to take medications? Independent  Home Assistive Devices / Equipment Home Equipment: Crutches  Prior Device Use: Indicate devices/aids used by the patient prior to current illness, exacerbation or injury? None of the above  Current Functional Level Cognition  Arousal/Alertness: Awake/alert Overall Cognitive Status: Impaired/Different from baseline Difficult to assess due to: Level of arousal(Pt was lethargic at first, but continued to wake up ) Orientation Level: Oriented X4 Following Commands: Follows one step commands with increased time Safety/Judgement: Decreased awareness of safety, Decreased awareness of deficits General Comments: Pt demonstrates impulsivity and decreased awareness of deficits, required frequent vc for safety awareness, safe hand placement, and attending to the left side of enviornment;pt required increased time for processing;would attend to left side of environment with vc Attention: Selective Selective Attention: Impaired Selective Attention Impairment: Verbal complex, Functional complex Memory: Appears intact Awareness: Appears intact Problem Solving:  Appears intact Behaviors: Impulsive Safety/Judgment: Appears intact    Extremity Assessment (includes Sensation/Coordination)  Upper Extremity Assessment: Generalized weakness, LUE deficits/detail RUE Deficits / Details: AROM, WFLs; strong LUE Deficits / Details: trace movement in digits;0 elbow flexion/extension;frequently hits her hand when attempting to place it, reports she feels it but it doesn't hurt, able to feel temperature of water and light/deep pressure intact LUE Sensation:  decreased proprioception LUE Coordination: decreased fine motor, decreased gross motor  Lower Extremity Assessment: Defer to PT evaluation RLE Deficits / Details: WFL, but with proximal weaknesses/hip flexion LLE Deficits / Details: pt reports feeling she is rolling her ankle during ambulation  LLE Coordination: decreased fine motor, decreased gross motor    ADLs  Overall ADL's : Needs assistance/impaired Eating/Feeding: Set up, Sitting Eating/Feeding Details (indicate cue type and reason): pt reports difficulty opening containers during breakfast, ate before OT arrival Grooming: Moderate assistance, Standing, Wash/dry hands, Wash/dry face, Brushing hair Grooming Details (indicate cue type and reason): completed while standing at the sink;initiated incorporating LUE into handwashing Upper Body Bathing: Moderate assistance, Sitting Lower Body Bathing: Maximal assistance, Total assistance, Sitting/lateral leans, Sit to/from stand Upper Body Dressing : Moderate assistance, Sitting Lower Body Dressing: Maximal assistance, Sitting/lateral leans, Sit to/from stand, +2 for physical assistance, +2 for safety/equipment Lower Body Dressing Details (indicate cue type and reason): assistance to don/doff socks Toilet Transfer: Moderate assistance, +2 for physical assistance, Stand-pivot, Ambulation, BSC Toilet Transfer Details (indicate cue type and reason): pt with urinary incontinence in standing, required modA+2 for  safety for stand pivot toward R to [facility] Toileting- Clothing Manipulation and Hygiene: Moderate assistance, Sitting/lateral lean Toileting - Clothing Manipulation Details (indicate cue type and reason): pt completed pericare with modA for safety Functional mobility during ADLs: Moderate assistance, +2 for physical assistance, +2 for safety/equipment, Cueing for safety, Cueing for sequencing General ADL Comments: ambulated from EOB to sink with modA+2 at [facility] level:pt completed ADL while standing at sink level, required L knee block and vc for weight shifting and attending to L side of environment;    Mobility  Overal bed mobility: Needs Assistance Bed Mobility: Supine to Sit Rolling: Mod assist, +2 for physical assistance, +2 for safety/equipment Sidelying to sit: Mod assist, +2 for physical assistance, +2 for safety/equipment Supine to sit: Min guard, HOB elevated Sit to supine: Max assist, +2 for physical assistance General bed mobility comments: Pt seated in recliner min assistance to elevate trunk away from back of recliner.    Transfers  Overall transfer level: Needs assistance Equipment used: Left platform walker Transfers: Sit to/from Stand Sit to Stand: Min assist Stand pivot transfers: Mod assist, +2 physical assistance, +2 safety/equipment General transfer comment: cues to push up from recliner, reach back for recliner.  Pt unaware of L side and required cues to guard LUE when transitioning sit to stand.    Ambulation / Gait / Stairs / Wheelchair Mobility  Ambulation/Gait Ambulation/Gait assistance: Mod assist Gait Distance (Feet): 24 Feet Assistive device: Left platform walker Gait Pattern/deviations: Step-to pattern, Decreased dorsiflexion - left, Steppage General Gait Details: patient slightly impulsive, requires cues to stop and regroup when out of sequence. Increased fall risk.  Pt pushing RW too far forward and moves quicker than her legs are able to catch up to.  L knee  buckling in stance phase noted. Gait velocity: decreased Gait velocity interpretation: <1.8 ft/sec, indicate of risk for recurrent falls    Posture / Balance Dynamic Sitting Balance Sitting balance - Comments: reliant on RUE for sitting balance Balance Overall balance assessment: Needs assistance Sitting-balance support: Feet supported Sitting balance-Leahy Scale: Fair Sitting balance - Comments: reliant on RUE for sitting balance Postural control: Posterior lean Standing balance support: Single extremity supported, During functional activity Standing balance-Leahy Scale: Fair Standing balance comment: assistance for L knee stability and weight shift assistance.    Special needs/care consideration BiPAP/CPAP : no CPM : no Continuous  Drip IV : no Dialysis : no        Days : no Life Vest : no Oxygen : no Special Bed : no Trach Size : no Wound Vac (area) : no      Location : no Skin : no areas of concern                      Bowel mgmt: last BM 07/21/2019, continent Bladder mgmt: external catheter in place, incontinent Diabetic mgmt: yes Behavioral consideration : no Chemo/radiation : no   Previous Home Environment (from acute therapy documentation) Living Arrangements: Spouse/significant other  Lives With: Spouse Available Help at Discharge: Family, Available PRN/intermittently, Available 24 hours/day, Other (Comment) Type of Home: House Home Layout: One level Home Access: Stairs to enter Entrance Stairs-Rails: None Entrance Stairs-Number of Steps: 2-4 Bathroom Shower/Tub: Chiropodist: Standard Home Care Services: No  Discharge Living Setting Plans for Discharge Living Setting: Patient's home, Lives with (comment)(husband and two sons (<18 yo)) Type of Home at Discharge: House Discharge Home Layout: One level Discharge Home Access: Stairs to enter Entrance Stairs-Rails: None Entrance Stairs-Number of Steps: 2 Discharge Bathroom Shower/Tub:  Tub/shower unit Discharge Bathroom Toilet: Standard Discharge Bathroom Accessibility: Yes How Accessible: Accessible via walker(if sideways) Does the patient have any problems obtaining your medications?: No  Social/Family/Support Systems Patient Roles: Spouse, Parent, Other (Comment)(full time employee for [employer]) Contact Information: husband: Kyung Rudd): [phone] Anticipated Caregiver: husband Anticipated Caregiver's Contact Information: see above Ability/Limitations of Caregiver: Min/Mod A Caregiver Availability: 24/7 Discharge Plan Discussed with Primary Caregiver: Yes Is Caregiver In Agreement with Plan?: Yes Does Caregiver/Family have Issues with Lodging/Transportation while Pt is in Rehab?: No  Goals/Additional Needs Patient/Family Goal for Rehab: PT/OT: Min A; SLP: Supervision  Expected length of stay: 14-18 days Cultural Considerations: [facility] Dietary Needs: heart healthy/carb modified; thin liquids Equipment Needs: TBD Pt/Family Agrees to Admission and willing to participate: Yes Program Orientation Provided & Reviewed with Pt/Caregiver Including Roles  & Responsibilities: Yes(pt and husband)  Barriers to Discharge: Home environment access/layout  Barriers to Discharge Comments: steps to enter home; husband will need to take FMLA (which he is agreeable to).   Decrease burden of Care through IP rehab admission: NA  Possible need for SNF placement upon discharge: Not anticipated; pt has great family support at DC and a husband who is able to provide physical assistance as needed. Anticipate pt has a good prognosis for further progress through CIR program.   Patient Condition: This patient's medical and functional status has changed since the /consult dated: 07/18/2019 in which the Rehabilitation Physician determined and documented that the patient's condition is appropriate for intensive rehabilitative care in an inpatient rehabilitation facility. See "History of Present  Illness" (above) for medical update. Functional changes are: improvement in overall bed mobility from Max/Total A to Mod A +2. Pt has progressed in function to be able to do gait training with Mod A for 24 feet using left platform walker, Min A to stand, and is currently Mod/Max A for ADLs. Patient's medical and functional status update has been discussed with the Rehabilitation physician and patient remains appropriate for inpatient rehabilitation. Will admit to inpatient rehab today.  Preadmission Screen Completed By:  Raechel Ache, 07/22/2019 4:36 PM ______________________________________________________________________   Discussed status with Dr. Ranell Patrick on 07/22/2019 at 4:33PM and received approval for admission today.  Admission Coordinator:  Raechel Ache, OT, time 4:33PM/Date 07/22/2019   Assessment/Plan: Diagnosis: Right basal ganglia infarct with left hemiparesis  1. Does the need for close, 24 hr/day medical supervision in concert with the patient's rehab needs make it unreasonable for this patient to be served in a less intensive setting? Yes 2. Co-Morbidities requiring supervision/potential complications: Morbid obesity, type 2 diabetes, hypertension, left facial weakness 3. Due to bladder management, bowel management, safety, skin/wound care, disease management, medication administration, pain management and patient education, does the patient require 24 hr/day rehab nursing? Yes 4. Does the patient require coordinated care of a physician, rehab nurse, therapy disciplines of PT, OT, speech, rec therapy to address physical and functional deficits in the context of the above medical diagnosis(es)? Yes Addressing deficits in the following areas: balance, endurance, locomotion, strength, transferring, bowel/bladder control, bathing, dressing, feeding, grooming, toileting, psychosocial support and Oral facial weakness 5. Can the patient actively participate in an intensive therapy program of at  least 3 hrs of therapy per day at least 5 days per week? Yes 6. The potential for patient to make measurable gains while on inpatient rehab is excellent 7. Anticipated functional outcomes upon discharge from inpatient rehab are modified independent and supervision  with PT, modified independent and supervision with OT, independent with SLP. 8. Estimated rehab length of stay to reach the above functional goals is: 10 to 14 days 9. Anticipated discharge destination: Home 10. Overall Rehab/Functional Prognosis: excellent  RECOMMENDATIONS: This patient's condition is appropriate for continued rehabilitative care in the following setting: CIR Patient has agreed to participate in recommended program. Yes Note that insurance prior authorization may be required for reimbursement for recommended care.  Comment: Mrs. Mcphearson would be an excellent inpatient rehabilitation candidate and would be able to tolerate 3H of daily therapy. Other issues that should be addressed in rehab are her depression and right shoulder pain.   MD Signature: Leeroy Cha, MD

## 2019-07-18 NOTE — Consult Note (Signed)
Physical Medicine and Rehabilitation Consult   Reason for Consult: stroke with functional deficits.  Referring Physician: Dr. Leonie Man   HPI: Kathryn Coffey is a 48 y.o. female with history of HTN, T2MD, morbid obesity, possible ocular herpes,  who was admitted on 07/15/19 with fall night prior to admission with reports of HA and then went on to develop flaccid left hemiparesis a couple of hours later. CT head showed acute non-hemorrhagic infarct in right basal ganglia with hyperdense R-MCA sign concerning for thrombus. CTA/P head/neck showed large mismatch with penumbra R-MCA, occluded right internal carotid artery with slow flow M1. She underwent cerebral angio with revascularization of R-MCA with T1C1 3 revascularization and repair of R-ICA proximal occlusive dissection with flow diverter device.   Post procedure CT head negative for bleed and repeat CTA head/neck showed reocclusion of R-ICA ar origin and occlusion of R-ICA stent now with patent R-MCA.  Follow up MRI brain 11/26 showed acute right basal ganglia infarct with hemorrhagic transformation in right caudate and right lenticular nucleus, small right frontal lobe infarct and infarct extending into insula and right temporal lobe. Dr. Leonie Man felt that stroke thromboembolic in setting of large vessel disease and patient on ASA/Brillinta due to stent.  2 D echo showed EF 60-65% with grade III diastolic dysfunction. She tolerated extubation 11/26 and remains NPO pending swallow evaluation.  Patient with resultant right gaze preference, left facial weakness and left hemiparesis affecting mobility and ADLs. CIR recommended due to functional deficits.     ROS    Past Medical History:  Diagnosis Date   Abdominal wall cellulitis 2013 and 2014   Asthma    allergic to grasses   B12 deficiency    Complication of anesthesia    Costochondritis    Diabetes mellitus without complication (HCC)    Gastric anomaly    multiple small ulcers    GERD (gastroesophageal reflux disease)    Herpes 06/2018   POSSIBLY IN EYE- PT TAKING VALTREX AND WILL SEE OPTHAMOLOGIST TO SEE IF VALTREX IS WORKING   History of abnormal cervical Pap smear    History of Clostridium difficile colitis    History of hiatal hernia    History of kidney stones    Hypertension    IBS (irritable bowel syndrome)    Migraine    Morbid obesity ([place])    s/p attempted gastric banding now decompressed   PCOS (polycystic ovarian syndrome)    PONV (postoperative nausea and vomiting)    WITH SPINAL ONLY    Past Surgical History:  Procedure Laterality Date   BREAST EXCISIONAL BIOPSY Left    cyst excision - Dr. Patty Sermons   CESAREAN SECTION     CHOLECYSTECTOMY     COLONOSCOPY     EAR CYST EXCISION Right 07/04/2018   Procedure: EXCISION GLOMUS TUMOR THUMBNAIL;  Surgeon: Corky Mull, MD;  Location: [facility] ORS;  Service: Orthopedics;  Laterality: Right;   ESOPHAGOGASTRODUODENOSCOPY     ESOPHAGOGASTRODUODENOSCOPY (EGD) WITH PROPOFOL N/A 12/06/2015   Procedure: ESOPHAGOGASTRODUODENOSCOPY (EGD) WITH PROPOFOL;  Surgeon: Manya Silvas, MD;  Location: [facility] ENDOSCOPY;  Service: Endoscopy;  Laterality: N/A;   IR PERCUTANEOUS ART THROMBECTOMY/INFUSION INTRACRANIAL INC DIAG ANGIO  07/15/2019   LAPAROSCOPIC GASTRIC BANDING     LAPAROSCOPIC GASTRIC RESTRICTIVE DUODENAL PROCEDURE (DUODENAL SWITCH) N/A 01/25/2016   Procedure: LAPAROSCOPIC GASTRIC RESTRICTIVE DUODENAL PROCEDURE (DUODENAL SWITCH);  Surgeon: Ladora Daniel, MD;  Location: [facility] ORS;  Service: General;  Laterality: N/A;   OVARY SURGERY  x2   RADIOLOGY WITH ANESTHESIA N/A 07/15/2019   Procedure: IR WITH ANESTHESIA;  Surgeon: Luanne Bras, MD;  Location: [place];  Service: Radiology;  Laterality: N/A;   TONSILLECTOMY     TUBAL LIGATION     UMBILICAL HERNIA REPAIR N/A 01/25/2016   Procedure: LAPAROSCOPIC UMBILICAL HERNIA;  Surgeon: Ladora Daniel, MD;  Location: [facility] ORS;  Service:  General;  Laterality: N/A;   UMBILICAL HERNIA REPAIR N/A 10/20/2016   Procedure: HERNIA REPAIR UMBILICAL ADULT;  Surgeon: Leonie Green, MD;  Location: [facility] OR[facility];  Service: General;  Laterality: N/A;    Family History  Problem Relation Age of Onset   Breast cancer Maternal Grandmother 51   Diabetes Mother    Diabetes Father     Social History:  reports that she has never smoked. She has never used smokeless tobacco. She reports current alcohol use. She reports that she does not use drugs.    Allergies  Allergen Reactions   Azithromycin Other (See Comments)    Abdominal wall abcess   Ivp Dye [Iodinated Diagnostic Agents] Hives   Lexapro [Escitalopram] Other (See Comments)    migraine   Vicodin [Hydrocodone-Acetaminophen] Other (See Comments)    migraine    Medications Prior to Admission  Medication Sig Dispense Refill   albuterol (PROVENTIL HFA;VENTOLIN HFA) 108 (90 Base) MCG/ACT inhaler Inhale 2 puffs into the lungs every 6 (six) hours as needed for wheezing or shortness of breath.     amphetamine-dextroamphetamine (ADDERALL) 10 MG tablet Take 10 mg by mouth See admin instructions. Take 10 mg by mouth daily Monday through Friday.     buPROPion (WELLBUTRIN) 75 MG tablet Take 75 mg by mouth 2 (two) times daily.     Calcium Carb-Cholecalciferol (CALCIUM + VITAMIN D3 PO) Take 1 tablet by mouth daily.     fexofenadine (ALLEGRA) 180 MG tablet Take 180 mg by mouth daily as needed for allergies.      fluticasone (FLONASE) 50 MCG/ACT nasal spray Place 2 sprays into both nostrils daily as needed for allergies or rhinitis.      liraglutide (VICTOZA) 18 MG/3ML SOPN Inject 1.2 mg into the skin daily.     losartan-hydrochlorothiazide (HYZAAR) 100-25 MG tablet Take 1 tablet by mouth every morning.      metFORMIN (GLUCOPHAGE) 500 MG tablet Take 500 mg by mouth 2 (two) times daily.  11   Multiple Vitamins-Minerals (MULTIVITAMIN PO) Take 2 tablets by mouth daily.     NON  FORMULARY 1 Dose once a week. Wednesdays     omeprazole (PRILOSEC) 20 MG capsule Take 20 mg by mouth daily.     PREDNISONE PO Take 1 tablet by mouth as needed (headache).     traMADol (ULTRAM) 50 MG tablet Take 1 tablet (50 mg total) by mouth every 6 (six) hours as needed for moderate pain. 20 tablet 0   triamterene-hydrochlorothiazide (DYAZIDE) 37.5-25 MG capsule Take 1 capsule by mouth every morning.       Home: Home Living Family/patient expects to be discharged to:: Private residence Living Arrangements: Spouse/significant other Available Help at Discharge: Family, Available PRN/intermittently, Available 24 hours/day, Other (Comment)(husband feels his employer will give him the time ) Type of Home: House Home Access: Stairs to enter [employer] of Steps: 2-4 Entrance Stairs-Rails: None(on the most used stairs) Home Layout: One level Bathroom Shower/Tub: Chiropodist: Standard Home Equipment: Crutches  Functional History: Prior Function Level of Independence: Independent Functional Status:  Mobility: Bed Mobility Overal bed mobility: Needs  Assistance Bed Mobility: Rolling, Sidelying to Sit, Sit to Supine Rolling: Total assist Sidelying to sit: Max assist Sit to supine: Total assist General bed mobility comments: 2 person not available, but necessary Transfers General transfer comment: deferred today due to just back from CT Ambulation/Gait General Gait Details: NT    ADL:    Cognition: Cognition Overall Cognitive Status: (NT formally, answered questions appropriately) Orientation Level: Oriented X4 Cognition Arousal/Alertness: Lethargic, Awake/alert Behavior During Therapy: WFL for tasks assessed/performed, Flat affect Overall Cognitive Status: (NT formally, answered questions appropriately)   Blood pressure 136/84, pulse 66, temperature 97.9 F (36.6 C), temperature source Oral, resp. rate 16, height 5\' 5"  (1.651 m), weight  124.4 kg, SpO2 100 %. Physical Exam  Results for orders placed or performed during the hospital encounter of 07/15/19 (from the past 24 hour(s))  Glucose, capillary     Status: Abnormal   Collection Time: 07/17/19 12:00 PM  Result Value Ref Range   Glucose-Capillary 130 (H) 70 - 99 mg/dL  Glucose, capillary     Status: Abnormal   Collection Time: 07/17/19  3:46 PM  Result Value Ref Range   Glucose-Capillary 153 (H) 70 - 99 mg/dL   Comment 1 Notify RN    Comment 2 Document in Chart   Glucose, capillary     Status: Abnormal   Collection Time: 07/17/19  7:25 PM  Result Value Ref Range   Glucose-Capillary 174 (H) 70 - 99 mg/dL  Glucose, capillary     Status: Abnormal   Collection Time: 07/17/19 11:09 PM  Result Value Ref Range   Glucose-Capillary 136 (H) 70 - 99 mg/dL  Glucose, capillary     Status: Abnormal   Collection Time: 07/18/19  3:47 AM  Result Value Ref Range   Glucose-Capillary 132 (H) 70 - 99 mg/dL  CBC     Status: Abnormal   Collection Time: 07/18/19  5:59 AM  Result Value Ref Range   WBC 10.3 4.0 - 10.5 K/uL   RBC 3.72 (L) 3.87 - 5.11 MIL/uL   Hemoglobin 8.8 (L) 12.0 - 15.0 g/dL   HCT 28.4 (L) 36.0 - 46.0 %   MCV 76.3 (L) 80.0 - 100.0 fL   MCH 23.7 (L) 26.0 - 34.0 pg   MCHC 31.0 30.0 - 36.0 g/dL   RDW 17.5 (H) 11.5 - 15.5 %   Platelets 337 150 - 400 K/uL   nRBC 0.0 0.0 - 0.2 %  Basic metabolic panel     Status: Abnormal   Collection Time: 07/18/19  5:59 AM  Result Value Ref Range   Sodium 140 135 - 145 mmol/L   Potassium 3.6 3.5 - 5.1 mmol/L   Chloride 106 98 - 111 mmol/L   CO2 26 22 - 32 mmol/L   Glucose, Bld 151 (H) 70 - 99 mg/dL   BUN 13 6 - 20 mg/dL   Creatinine, Ser 0.68 0.44 - 1.00 mg/dL   Calcium 8.5 (L) 8.9 - 10.3 mg/dL   GFR calc non Af Amer >60 >60 mL/min   GFR calc Af Amer >60 >60 mL/min   Anion gap 8 5 - 15  Glucose, capillary     Status: Abnormal   Collection Time: 07/18/19  8:46 AM  Result Value Ref Range   Glucose-Capillary 176 (H) 70 -  99 mg/dL   Ct Angio Head W Or Wo Contrast  Result Date: 07/16/2019 CLINICAL DATA:  Stroke. Endovascular revascularization on the right 07/15/2019 EXAM: CT ANGIOGRAPHY HEAD AND NECK CT PERFUSION BRAIN TECHNIQUE:  Multidetector CT imaging of the head and neck was performed using the standard protocol during bolus administration of intravenous contrast. Multiplanar CT image reconstructions and MIPs were obtained to evaluate the vascular anatomy. Carotid stenosis measurements (when applicable) are obtained utilizing NASCET criteria, using the distal internal carotid diameter as the denominator. Multiphase CT imaging of the brain was performed following IV bolus contrast injection. Subsequent parametric perfusion maps were calculated using RAPID software. CONTRAST:  146mL OMNIPAQUE IOHEXOL 350 MG/ML SOLN COMPARISON:  CT a head and neck 07/15/2019 FINDINGS: CTA NECK FINDINGS Aortic arch: Bovine branching pattern of the arch. Aortic arch and proximal great vessels appear normal. Right carotid system: Right common carotid artery normal. Occlusion of the proximal right internal carotid artery. Above the occluded segment, there is a stent which is occluded. There is reconstitution of the supraclinoid internal carotid artery on the right. Right external carotid artery is patent. Left carotid system: Left carotid widely patent without stenosis or irregularity. Vertebral arteries: Both vertebral arteries patent to the basilar. Left vertebral artery is dominant. Skeleton: No acute skeletal abnormality. Other neck: Patient is intubated. NG tube also in place. No soft tissue mass or edema in the neck. Upper chest: Mild atelectasis or scarring in the left upper lobe. No acute abnormality. Review of the MIP images confirms the above findings CTA HEAD FINDINGS Anterior circulation: Right internal carotid artery is occluded and reconstitutes in the supraclinoid segment. Right M1 segment widely patent. Right M2 segments patent.  Previously noted thrombus in the right M1 and M2 segments on the prior CTA yesterday no longer present. Anterior cerebral arteries patent bilaterally. Left MCA patent without significant stenosis. Posterior circulation: Both vertebral arteries patent to the basilar. Basilar widely patent. Superior cerebellar and posterior cerebral arteries patent bilaterally. Venous sinuses: Patent Anatomic variants: None Review of the MIP images confirms the above findings CT Brain Perfusion Findings: CBF (<30%) Volume: 70mL Perfusion (Tmax>6.0s) volume: 42mL Mismatch Volume: 30mL Infarction Location:None IMPRESSION: 1. CT perfusion negative for acute infarct or ischemia. Interval normalization of infarct in the right basal ganglia with ischemia in the right MCA territory on CT perfusion yesterday. 2. Reocclusion of right internal carotid artery near the origin. Right internal carotid artery stent is occluded. Right MCA now widely patent. 3. Left carotid and both vertebral arteries widely patent. Electronically Signed   By: Franchot Gallo M.D.   On: 07/16/2019 11:27   Ct Head Wo Contrast  Result Date: 07/16/2019 CLINICAL DATA:  Stroke post revascularization EXAM: CT HEAD WITHOUT CONTRAST TECHNIQUE: Contiguous axial images were obtained from the base of the skull through the vertex without intravenous contrast. COMPARISON:  July 15, 2019 FINDINGS: Brain: There is better delineation of hypoattenuation and loss of gray-white differentiation involving the right basal ganglia, posterior insula, and superior right temporal lobe. No acute intracranial hemorrhage. Mild mass effect on the right frontal horn. Vascular: No new abnormality. Skull: Unremarkable. Sinuses/Orbits: Mild mucosal thickening.  Orbits are unremarkable. Other: Mastoid air cells are clear. IMPRESSION: Evolving recent infarction involving the right basal ganglia and adjacent white matter, posterior insula, and superior right temporal lobe. No acute intracranial  hemorrhage or significant mass effect. Electronically Signed   By: Macy Mis M.D.   On: 07/16/2019 15:14   Ct Angio Neck W Or Wo Contrast  Result Date: 07/16/2019 CLINICAL DATA:  Stroke. Endovascular revascularization on the right 07/15/2019 EXAM: CT ANGIOGRAPHY HEAD AND NECK CT PERFUSION BRAIN TECHNIQUE: Multidetector CT imaging of the head and neck was performed using the standard  protocol during bolus administration of intravenous contrast. Multiplanar CT image reconstructions and MIPs were obtained to evaluate the vascular anatomy. Carotid stenosis measurements (when applicable) are obtained utilizing NASCET criteria, using the distal internal carotid diameter as the denominator. Multiphase CT imaging of the brain was performed following IV bolus contrast injection. Subsequent parametric perfusion maps were calculated using RAPID software. CONTRAST:  161mL OMNIPAQUE IOHEXOL 350 MG/ML SOLN COMPARISON:  CT a head and neck 07/15/2019 FINDINGS: CTA NECK FINDINGS Aortic arch: Bovine branching pattern of the arch. Aortic arch and proximal great vessels appear normal. Right carotid system: Right common carotid artery normal. Occlusion of the proximal right internal carotid artery. Above the occluded segment, there is a stent which is occluded. There is reconstitution of the supraclinoid internal carotid artery on the right. Right external carotid artery is patent. Left carotid system: Left carotid widely patent without stenosis or irregularity. Vertebral arteries: Both vertebral arteries patent to the basilar. Left vertebral artery is dominant. Skeleton: No acute skeletal abnormality. Other neck: Patient is intubated. NG tube also in place. No soft tissue mass or edema in the neck. Upper chest: Mild atelectasis or scarring in the left upper lobe. No acute abnormality. Review of the MIP images confirms the above findings CTA HEAD FINDINGS Anterior circulation: Right internal carotid artery is occluded and  reconstitutes in the supraclinoid segment. Right M1 segment widely patent. Right M2 segments patent. Previously noted thrombus in the right M1 and M2 segments on the prior CTA yesterday no longer present. Anterior cerebral arteries patent bilaterally. Left MCA patent without significant stenosis. Posterior circulation: Both vertebral arteries patent to the basilar. Basilar widely patent. Superior cerebellar and posterior cerebral arteries patent bilaterally. Venous sinuses: Patent Anatomic variants: None Review of the MIP images confirms the above findings CT Brain Perfusion Findings: CBF (<30%) Volume: 47mL Perfusion (Tmax>6.0s) volume: 31mL Mismatch Volume: 51mL Infarction Location:None IMPRESSION: 1. CT perfusion negative for acute infarct or ischemia. Interval normalization of infarct in the right basal ganglia with ischemia in the right MCA territory on CT perfusion yesterday. 2. Reocclusion of right internal carotid artery near the origin. Right internal carotid artery stent is occluded. Right MCA now widely patent. 3. Left carotid and both vertebral arteries widely patent. Electronically Signed   By: Franchot Gallo M.D.   On: 07/16/2019 11:27   Mr Brain Wo Contrast  Result Date: 07/17/2019 CLINICAL DATA:  Stroke.  Stroke intervention 07/15/2019 EXAM: MRI HEAD WITHOUT CONTRAST TECHNIQUE: Multiplanar, multiecho pulse sequences of the brain and surrounding structures were obtained without intravenous contrast. COMPARISON:  CT head 07/16/2019 FINDINGS: Brain: Acute infarct in the right basal ganglia involving the lenticular nuclei, and the caudate nucleus. Anterior limb internal capsule also involved. Acute infarct in the insula, right superior temporal lobe. Small amount of acute infarct in the right frontal lobe. Hemorrhagic transformation with mild amount of hemorrhage in the right caudate and right lenticular nuclei. Ventricle size normal. No midline shift. Mild chronic microvascular ischemic changes in the  white matter. Vascular: Loss of flow void in the right internal carotid artery compatible with occlusion as noted on CTA. Otherwise normal flow voids. Skull and upper cervical spine: Negative Sinuses/Orbits: Mild mucosal edema paranasal sinuses. Negative orbit Other: None IMPRESSION: Acute infarct in the right basal ganglia with hemorrhagic transformation in the right caudate and right lenticular nucleus. Acute infarct also extends into the insula and right temporal lobe. Small acute infarct in the right frontal lobe. Occluded right internal carotid artery. Electronically Signed   By:  Franchot Gallo M.D.   On: 07/17/2019 10:32   Ct Cerebral Perfusion W Contrast  Result Date: 07/16/2019 CLINICAL DATA:  Stroke. Endovascular revascularization on the right 07/15/2019 EXAM: CT ANGIOGRAPHY HEAD AND NECK CT PERFUSION BRAIN TECHNIQUE: Multidetector CT imaging of the head and neck was performed using the standard protocol during bolus administration of intravenous contrast. Multiplanar CT image reconstructions and MIPs were obtained to evaluate the vascular anatomy. Carotid stenosis measurements (when applicable) are obtained utilizing NASCET criteria, using the distal internal carotid diameter as the denominator. Multiphase CT imaging of the brain was performed following IV bolus contrast injection. Subsequent parametric perfusion maps were calculated using RAPID software. CONTRAST:  112mL OMNIPAQUE IOHEXOL 350 MG/ML SOLN COMPARISON:  CT a head and neck 07/15/2019 FINDINGS: CTA NECK FINDINGS Aortic arch: Bovine branching pattern of the arch. Aortic arch and proximal great vessels appear normal. Right carotid system: Right common carotid artery normal. Occlusion of the proximal right internal carotid artery. Above the occluded segment, there is a stent which is occluded. There is reconstitution of the supraclinoid internal carotid artery on the right. Right external carotid artery is patent. Left carotid system: Left  carotid widely patent without stenosis or irregularity. Vertebral arteries: Both vertebral arteries patent to the basilar. Left vertebral artery is dominant. Skeleton: No acute skeletal abnormality. Other neck: Patient is intubated. NG tube also in place. No soft tissue mass or edema in the neck. Upper chest: Mild atelectasis or scarring in the left upper lobe. No acute abnormality. Review of the MIP images confirms the above findings CTA HEAD FINDINGS Anterior circulation: Right internal carotid artery is occluded and reconstitutes in the supraclinoid segment. Right M1 segment widely patent. Right M2 segments patent. Previously noted thrombus in the right M1 and M2 segments on the prior CTA yesterday no longer present. Anterior cerebral arteries patent bilaterally. Left MCA patent without significant stenosis. Posterior circulation: Both vertebral arteries patent to the basilar. Basilar widely patent. Superior cerebellar and posterior cerebral arteries patent bilaterally. Venous sinuses: Patent Anatomic variants: None Review of the MIP images confirms the above findings CT Brain Perfusion Findings: CBF (<30%) Volume: 68mL Perfusion (Tmax>6.0s) volume: 40mL Mismatch Volume: 26mL Infarction Location:None IMPRESSION: 1. CT perfusion negative for acute infarct or ischemia. Interval normalization of infarct in the right basal ganglia with ischemia in the right MCA territory on CT perfusion yesterday. 2. Reocclusion of right internal carotid artery near the origin. Right internal carotid artery stent is occluded. Right MCA now widely patent. 3. Left carotid and both vertebral arteries widely patent. Electronically Signed   By: Franchot Gallo M.D.   On: 07/16/2019 11:27     Assessment/Plan: Diagnosis: Right basal ganglia infarct with left hemiparesis 1. Does the need for close, 24 hr/day medical supervision in concert with the patient's rehab needs make it unreasonable for this patient to be served in a less intensive  setting? Yes 2. Co-Morbidities requiring supervision/potential complications: Morbid obesity, type 2 diabetes, hypertension, left facial weakness 3. Due to bladder management, bowel management, safety, skin/wound care, disease management, medication administration, pain management and patient education, does the patient require 24 hr/day rehab nursing? Yes 4. Does the patient require coordinated care of a physician, rehab nurse, therapy disciplines of PT, OT, speech, rec therapy to address physical and functional deficits in the context of the above medical diagnosis(es)? Yes Addressing deficits in the following areas: balance, endurance, locomotion, strength, transferring, bowel/bladder control, bathing, dressing, feeding, grooming, toileting, psychosocial support and Oral facial weakness 5. Can the  patient actively participate in an intensive therapy program of at least 3 hrs of therapy per day at least 5 days per week? Yes 6. The potential for patient to make measurable gains while on inpatient rehab is excellent 7. Anticipated functional outcomes upon discharge from inpatient rehab are modified independent and supervision  with PT, modified independent and supervision with OT, independent with SLP. 8. Estimated rehab length of stay to reach the above functional goals is: 10 to 14 days 9. Anticipated discharge destination: Home 10. Overall Rehab/Functional Prognosis: excellent  RECOMMENDATIONS: This patient's condition is appropriate for continued rehabilitative care in the following setting: CIR Patient has agreed to participate in recommended program. Yes Note that insurance prior authorization may be required for reimbursement for recommended care.  Comment: Husband is planning to install walk-in shower  Charlett Blake M.D. [facility] FAAPM&R (Sports Med, Neuromuscular Med) Diplomate Am Board of [place], PA-C 07/18/2019

## 2019-07-18 NOTE — Progress Notes (Signed)
STROKE TEAM PROGRESS NOTE   INTERVAL HISTORY Patient continues to do well and is awake alert and interactive.  He still has right gaze preference and left hemiplegia which is slowly improving.  MRI scan of the brain yesterday showed right basal ganglia and patchy temporal MCA branch infarct with trace hemorrhagic transformation.  The right carotid is occluded in the neck  Vitals:   07/18/19 0600 07/18/19 0700 07/18/19 0800 07/18/19 0900  BP: 120/66 (!) 132/99 132/84 136/84  Pulse: 71 (!) 50 63 66  Resp: 15 13 14 16   Temp:   97.9 F (36.6 C)   TempSrc:   Oral   SpO2: 98% 97% 99% 100%  Weight:      Height:        CBC:  Recent Labs  Lab 07/15/19 1225  07/16/19 0450 07/18/19 0559  WBC 10.7*  --  15.0* 10.3  NEUTROABS 9.6*  --  12.9*  --   HGB 11.9*   < > 9.3* 8.8*  HCT 39.9   < > 30.8* 28.4*  MCV 78.1*  --  78.6* 76.3*  PLT 315  --  399 337   < > = values in this interval not displayed.    Basic Metabolic Panel:  Recent Labs  Lab 07/16/19 0450 07/18/19 0559  NA 135 140  K 3.5 3.6  CL 102 106  CO2 22 26  GLUCOSE 184* 151*  BUN 17 13  CREATININE 0.82 0.68  CALCIUM 8.3* 8.5*   Lipid Panel:     Component Value Date/Time   CHOL 129 07/16/2019 0450   TRIG 309 (H) 07/16/2019 0450   TRIG 321 (H) 07/16/2019 0450   HDL 26 (L) 07/16/2019 0450   CHOLHDL 5.0 07/16/2019 0450   VLDL 62 (H) 07/16/2019 0450   LDLCALC 41 07/16/2019 0450   HgbA1c:  Lab Results  Component Value Date   HGBA1C 8.0 (H) 07/16/2019   Urine Drug Screen: No results found for: LABOPIA, COCAINSCRNUR, LABBENZ, AMPHETMU, THCU, LABBARB  Alcohol Level No results found for: [facility]  IMAGING  MRI head 07/17/2019 Acute infarct in the right basal ganglia with hemorrhagic transformation in the right caudate and right lenticular nucleus. Acute infarct also extends into the insula and right temporal lobe. Small acute infarct in the right frontal lobe. Occluded right internal carotid artery.   MRI Head WO  Contrast - pending  Ct Head Wo Contrast 07/16/2019 Evolving recent infarction involving the right basal ganglia and adjacent white matter, posterior insula, and superior right temporal lobe. No acute intracranial hemorrhage or significant mass effect.   Ct Angio Head W Or Wo Contrast Ct Angio Neck W Or Wo Contrast Ct Cerebral Perfusion W Contrast 07/16/2019 1. CT perfusion negative for acute infarct or ischemia. Interval normalization of infarct in the right basal ganglia with ischemia in the right MCA territory on CT perfusion yesterday. 2. Reocclusion of right internal carotid artery near the origin. Right internal carotid artery stent is occluded. Right MCA now widely patent. 3. Left carotid and both vertebral arteries widely patent.   Ct Angio Head W Or Wo Contrast Ct Angio Neck W Or Wo Contrast Ct Cerebral Perfusion W Contrast 07/15/2019 1. Occluded right internal carotid artery without reconstitution at the neck or skull base. 2. There is some flow into the proximal right M1 segment via a patent anterior communicating artery and right A1 segment. 3. There is some collateral position of right MCA branch vessels. 4. CT perfusion study demonstrates in ischemic penumbra involving most  of the right MCA territory with a large mismatch volume of 142 mL. 5. Core infarct corresponds to the noncontrast CT of the head with involvement of the right lentiform nucleus. 6. No acute hemorrhage. 7. Diffuse distal small vessel disease. 8. Tortuosity of the cervical internal carotid arteries bilaterally. This is nonspecific, but often seen in the setting of longstanding hypertension.   Ct Head Wo Contrast 07/15/2019 No acute hemorrhage.   Ir Percutaneous Art Thrombectomy/infusion Intracranial Inc Diag Angio 07/15/2019 1. Occluded right internal carotid artery without reconstitution at the neck or skull base. 2. There is some flow into the proximal right M1 segment via a patent anterior communicating  artery and right A1 segment. 3. There is some collateral position of right MCA branch vessels. 4. CT perfusion study demonstrates in ischemic penumbra involving most of the right MCA territory with a large mismatch volume of 142 mL. 5. Core infarct corresponds to the noncontrast CT of the head with involvement of the right lentiform nucleus. 6. No acute hemorrhage. 7. Diffuse distal small vessel disease. 8. Tortuosity of the cervical internal carotid arteries bilaterally. This is nonspecific, but often seen in the setting of longstanding hypertension.   Ct Head Code Stroke Wo Contrast 07/15/2019 1. Acute non hemorrhagic infarct involving the right basal ganglia with hypoattenuation of the right lentiform nucleus. 2. Hyperdense right MCA suggesting thrombus. 3. ASPECTS is 9/10   Cerebral Angiogram 07/15/2019 S/P revascularization of Rt MCA M 1 with x 1 pass with the 39mm x 33mm embotrap retriver device and  Penumbra aspiration achieving a TICI 3 revascularization.  Repair of RT ICA prox  occlusive dissection with  Flow diverter device.  2D Echocardiogram 1. Left ventricular ejection fraction, by visual estimation, is 60 to 65%. The left ventricle has normal function. There is no left ventricular hypertrophy.  2. Left ventricular diastolic parameters are consistent with Grade III diastolic dysfunction (restrictive).  3. The left ventricle has no regional wall motion abnormalities.  4. Global right ventricle has normal systolic function.The right ventricular size is normal. No increase in right ventricular wall thickness.  5. Left atrial size was normal.  6. Right atrial size was normal.  7. The mitral valve is normal in structure. No evidence of mitral valve regurgitation. No evidence of mitral stenosis.  8. The tricuspid valve is normal in structure. Tricuspid valve regurgitation is not demonstrated.  9. The aortic valve is normal in structure. Aortic valve regurgitation is not visualized. No  evidence of aortic valve sclerosis or stenosis. 10. The pulmonic valve was grossly normal. Pulmonic valve regurgitation is not visualized. 11. TR signal is inadequate for assessing pulmonary artery systolic pressure. 12. The inferior vena cava is normal in size with greater than 50% respiratory variability, suggesting right atrial pressure of 3 mmHg.  Portable Chest Xray 07/16/2019 1.  Endotracheal tube and NG tube in stable position. 2. Low lung volumes with mild bibasilar atelectasis again noted without interim change.  07/15/2019 Tube positions as described without pneumothorax. Mild bibasilar atelectasis. No edema or consolidation. Heart upper normal in size.    PHYSICAL EXAM   Obese middle-aged Caucasian lady who is not in distress.. Afebrile. Head is nontraumatic. Neck is supple without bruit.    Cardiac exam no murmur or gallop. Lungs are clear to auscultation. Distal pulses are well felt. Neurological Exam : Patient is awake alert and interactive.  She has right gaze preference but is able to look all the way to the left.  She blinks to threat  bilaterally.  No visual field defect noted.  Moderate left lower facial weakness.  No dysarthria or aphasia.  Tongue midline.  Motor system exam shows left hemiparesis with left upper extremity grade 1-2/5 strength in left lower extremity 3/5 strength.  Sensation is intact bilaterally.  Tone is slightly diminished on the left.  Left plantar upgoing right downgoing.  Gait not tested.   NIHSS 7  ASSESSMENT/PLAN [name] is a 48 y.o. female with history of morbid obesity, DB, HTN, migraine, asthma, GERD who fell at home and refused to go to the hospital. Later she c/o of HA and noted to be flaccid on her L side. EMS was called. Enrolled in the [employer] Trial. Received tenecteplase at 1334 on 07/15/2019.  Stroke:   R basal ganglia/posterior insular/temporal lobe infarct w/ hemorrhagic transformation and R frontal lobe infarct s/p tenecteplase  (Timeless) and IR w/ revascularization M1 and R ICA stent, now occluded - thrombembolic in setting of large vessel disease source  Code Stroke CT head R basal ganglia infarct w/ hypoattenuation R lentiform nucleus. Hyperdense R MCA. ASPECTS 9    CTA head & neck R ICA occlusion. Small vessel disease. Tortuous ICAs.   CT perfusion R PCA penumbra w/ 142 mL large mismatch. R lentiform nucleus core infarct.   Cerebra angio occluded R MCA M1 w/ TICI3 revascularization. Repair of R ICA proximal occlusive dissection w/ flow diverter  Post IR CT brain NO ICH or mass effect.  CT head no ICH  Repeat CTA head & neck reocclusion R ICA near origin. R ICA stent is occluded. R MCA patent. L ICA and B VA open.   Repeat CT perfusion no infarct. R basal ganglia normalization of infarct   Repeat CT head evolving R basal ganglia, posterior insular and R temporal lobe infarct.   MRI  R basal ganglia infarct w/ hemorrhagic transformation into R caudate and R lentiform nucleus. Infarct into insula and R temporal lobe. Small R frontal lobe infarct   2D Echo EF 60-65%. No source of embolus   LDL 41  HgbA1c 8.0  SCDs for VTE prophylaxis  No antithrombotic prior to admission, now on aspirin 81 mg daily and Brilinta (ticagrelor) 90 mg bid given stent placement.  Therapy recommendations:  CIR. consult placed - pending  Disposition:  pending   Acute Respiratory Failure, resolved     Extubated 11/25  Stable  CCM signed off  Carotid Occlusion   S/p stent, reoccluded post placement  Avoid low BP; Keep SBP goal 130-150 (no meds to increase BP)  Hypertension  Home meds:  Losartan-HCTZ 100-25, triamterene-HCTZ 34.5-25  Stable . Hold BP meds for now, monitor BP, avoid pressers. Hope activity will elevate BP . SBP goal 130-150 given stent occlusion    Diabetes type II Uncontrolled  Home meds:  victoza 18, metformin 500 bid  HgbA1c 8.0, goal < 7.0  CBGs  SSI  Add levemir 10 u q hs    DB RN following  Dysphagia, resolved . Secondary to stroke . Cleared for diet . 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 On valacyclovir 1000 tid  Acute blood loss anemia 11.9->10.5->9.3->8.8 (monitor daily)  Hospital day # 3 Plan continue mobilize out of bed.  Therapy and inpatient rehab consults.   .  Recommend permissive hypertension given  right carotid stent occlusion and hence we will not restart home blood pressure medications unless she becomes significantly hypertensive.  She will need a second MRI to be performed sometime this afternoon or tomorrow morning as part of the Timeless stroke study.  Transfer to neurology floor bed later today.  Hopefully transfer to inpatient rehab in a couple of days after insurance approval and bed availability.  Discussed with patient, husband and critical care MD Dr. Ander Slade I have spent a total of 35   minutes with the patient and husband reviewing hospital notes,  test results, labs and examining the patient as well as establishing an assessment and plan that was discussed personally with the patient.  > 50% of time was spent in direct patient care.    Antony Contras, MD       To contact Stroke Continuity provider, please refer to [email or web address] After hours, contact General Neurology

## 2019-07-18 NOTE — Progress Notes (Signed)
Physical Therapy Treatment Patient Details Name: Kathryn Coffey MRN: [id number] DOB: 22-Sep-1970 Today's Date: 07/18/2019    History of Present Illness pt is a 48 y/o female admitted with left hemiplegia with right basal ganglia infarct. PMHx: DM, HTN and obesity    PT Comments    Pt pleasant with flat affect and very willing to mobilize. Pt with improved bed mobility, transfers and activity tolerance this session.  Pt able to initiate gait with limited 1' distance away from surface and back with 2 person assist. Pt with good transfers technique exiting to left in bed and able to pivot toward right with improved balance and tolerance. Pt educated for transfers, progression, balance and HEP. CIR remains very appropriate and encouraged mobility with nursing during the day.  BP 131/86 96% on RA HR 67   Follow Up Recommendations  CIR;Supervision/Assistance - 24 hour     Equipment Recommendations  Other (comment)(TBD)    Recommendations for Other Services Rehab consult     Precautions / Restrictions Precautions Precautions: Fall Precaution Comments: left hemiparesis Restrictions Weight Bearing Restrictions: No    Mobility  Bed Mobility Overal bed mobility: Needs Assistance Bed Mobility: Rolling;Sidelying to Sit Rolling: Mod assist;+2 for physical assistance;+2 for safety/equipment Sidelying to sit: Mod assist;+2 for physical assistance;+2 for safety/equipment       General bed mobility comments: modA +2 overall. cues for RUE assisting with rail to roll and trunk elevation; cues and assist for trunk elevation and for BLEs off of EOB  Transfers Overall transfer level: Needs assistance Equipment used: 2 person hand held assist Transfers: Sit to/from [facility] Sit to Stand: Mod assist;+2 physical assistance;+2 safety/equipment Stand pivot transfers: Mod assist;+2 physical assistance;+2 safety/equipment       General transfer comment: Pt modA +2 for sit  to stands and for +83modA for stand pivot; L knee requiring blocking to take steps to recliner with multimodal cues toward right  Ambulation/Gait Ambulation/Gait assistance: Mod assist;+2 physical assistance Gait Distance (Feet): 1 Feet Assistive device: 2 person hand held assist Gait Pattern/deviations: Step-to pattern   Gait velocity interpretation: <1.8 ft/sec, indicate of risk for recurrent falls General Gait Details: pt able to step 1' away from chair and back with bil UE support, cues and assist for weight shifting and assist to advance and position LLE x 2 trials   Stairs             Wheelchair Mobility    Modified Rankin (Stroke Patients Only) Modified Rankin (Stroke Patients Only) Pre-Morbid Rankin Score: No symptoms Modified Rankin: Severe disability     Balance Overall balance assessment: Needs assistance Sitting-balance support: Feet supported;Single extremity supported;No upper extremity supported Sitting balance-Leahy Scale: Poor Sitting balance - Comments: needing her R UE assist or external assist Postural control: Posterior lean   Standing balance-Leahy Scale: Poor Standing balance comment: Pt requiring +2 for transfer with [facility] assist as pt not attending to LUE.                            Cognition Arousal/Alertness: Awake/alert Behavior During Therapy: WFL for tasks assessed/performed;Flat affect Overall Cognitive Status: Impaired/Different from baseline Area of Impairment: Safety/judgement                         Safety/Judgement: Decreased awareness of deficits     General Comments: Pt answering all questions, but would take increaed time to answer and when in recliner  would fling her head back and sigh with no reason stated      Exercises General Exercises - Lower Extremity Long Arc Quad: AAROM;Left;Seated;10 reps Hip Flexion/Marching: AAROM;Left;Seated;10 reps Other Exercises Other Exercises: LUE PROM/AAROM shoulder  through digits    General Comments General comments (skin integrity, edema, etc.): Pt requiring cues to attend to LUE.  Pt completing x4 sit to stands and pt fatigues easily.      Pertinent Vitals/Pain Pain Assessment: No/denies pain Pain Score: 2  Pain Location: headache Pain Descriptors / Indicators: Aching Pain Intervention(s): Monitored during session    Home Living Family/patient expects to be discharged to:: Private residence Living Arrangements: Spouse/significant other Available Help at Discharge: Family;Available PRN/intermittently;Available 24 hours/day;Other (Comment) Type of Home: House Home Access: Stairs to enter Entrance Stairs-Rails: None Home Layout: One level Home Equipment: Crutches      Prior Function Level of Independence: Independent          PT Goals (current goals can now be found in the care plan section) Acute Rehab PT Goals Patient Stated Goal: back to independence Progress towards PT goals: Progressing toward goals    Frequency    Min 4X/week      PT Plan Current plan remains appropriate    Co-evaluation PT/OT/SLP Co-Evaluation/Treatment: Yes Reason for Co-Treatment: Complexity of the patient's impairments (multi-system involvement);For patient/therapist safety PT goals addressed during session: Mobility/safety with mobility;Balance;Strengthening/ROM OT goals addressed during session: ADL's and self-care      AM-PAC PT "6 Clicks" Mobility   Outcome Measure  Help needed turning from your back to your side while in a flat bed without using bedrails?: A Lot Help needed moving from lying on your back to sitting on the side of a flat bed without using bedrails?: A Lot Help needed moving to and from a bed to a chair (including a wheelchair)?: A Lot Help needed standing up from a chair using your arms (e.g., wheelchair or bedside chair)?: A Lot Help needed to walk in hospital room?: Total Help needed climbing 3-5 steps with a railing? :  Total 6 Click Score: 10    End of Session Equipment Utilized During Treatment: Gait belt Activity Tolerance: Patient tolerated treatment well Patient left: in chair;with call bell/phone within reach;with chair alarm set Nurse Communication: Mobility status PT Visit Diagnosis: Muscle weakness (generalized) (M62.81);Hemiplegia and hemiparesis;Other abnormalities of gait and mobility (R26.89) Hemiplegia - Right/Left: Left Hemiplegia - dominant/non-dominant: Non-dominant Hemiplegia - caused by: Cerebral infarction     Time: [id number] PT Time Calculation (min) (ACUTE ONLY): 27 min  Charges:  $Therapeutic Activity: 8-22 mins                     Tiersa Dayley P, PT Acute Rehabilitation Services Pager: [phone] Office: [place] 07/18/2019, 1:06 PM

## 2019-07-18 NOTE — Evaluation (Signed)
Speech Language Pathology Evaluation Patient Details Name: Kathryn Coffey MRN: [id number] DOB: 1971-07-20 Today's Date: 07/18/2019 Time: [id number] SLP Time Calculation (min) (ACUTE ONLY): 15 min  Problem List:  Patient Active Problem List   Diagnosis Date Noted  . Stroke (cerebrum) ([place]) 07/15/2019  . Middle cerebral artery embolism, right 07/15/2019  . Controlled type 2 diabetes mellitus without complication, without long-term current use of insulin ([place]) 05/28/2017  . Attention deficit hyperactivity disorder (ADHD), combined type 04/18/2017  . Morbid obesity ([place]) 01/25/2016  . Umbilical hernia [id number]  . Hiatal hernia 12/08/2015  . History of Clostridium difficile colitis 06/19/2014  . HTN (hypertension) 12/23/2013  . PCOS (polycystic ovarian syndrome) 12/23/2013  . Bariatric surgery status 01/28/2013   Past Medical History:  Past Medical History:  Diagnosis Date  . Abdominal wall cellulitis 2013 and 2014  . Asthma    allergic to grasses  . B12 deficiency   . Complication of anesthesia   . Costochondritis   . Diabetes mellitus without complication ([place])   . Gastric anomaly    multiple small ulcers  . GERD (gastroesophageal reflux disease)   . Herpes 06/2018   POSSIBLY IN EYE- PT TAKING VALTREX AND WILL SEE OPTHAMOLOGIST TO SEE IF VALTREX IS WORKING  . History of abnormal cervical Pap smear   . History of Clostridium difficile colitis   . History of hiatal hernia   . History of kidney stones   . Hypertension   . IBS (irritable bowel syndrome)   . Migraine   . Morbid obesity ([place])    s/p attempted gastric banding now decompressed  . PCOS (polycystic ovarian syndrome)   . PONV (postoperative nausea and vomiting)    WITH SPINAL ONLY   Past Surgical History:  Past Surgical History:  Procedure Laterality Date  . BREAST EXCISIONAL BIOPSY Left    cyst excision - Dr. Patty Sermons  . CESAREAN SECTION    . CHOLECYSTECTOMY    . COLONOSCOPY    . EAR CYST EXCISION  Right 07/04/2018   Procedure: EXCISION GLOMUS TUMOR THUMBNAIL;  Surgeon: Corky Mull, MD;  Location: [facility] ORS;  Service: Orthopedics;  Laterality: Right;  . ESOPHAGOGASTRODUODENOSCOPY    . ESOPHAGOGASTRODUODENOSCOPY (EGD) WITH PROPOFOL N/A 12/06/2015   Procedure: ESOPHAGOGASTRODUODENOSCOPY (EGD) WITH PROPOFOL;  Surgeon: Manya Silvas, MD;  Location: [facility] ENDOSCOPY;  Service: Endoscopy;  Laterality: N/A;  . IR PERCUTANEOUS ART THROMBECTOMY/INFUSION INTRACRANIAL INC DIAG ANGIO  07/15/2019  . LAPAROSCOPIC GASTRIC BANDING    . LAPAROSCOPIC GASTRIC RESTRICTIVE DUODENAL PROCEDURE (DUODENAL SWITCH) N/A 01/25/2016   Procedure: LAPAROSCOPIC GASTRIC RESTRICTIVE DUODENAL PROCEDURE (DUODENAL SWITCH);  Surgeon: Ladora Daniel, MD;  Location: [facility] ORS;  Service: General;  Laterality: N/A;  . OVARY SURGERY     x2  . RADIOLOGY WITH ANESTHESIA N/A 07/15/2019   Procedure: IR WITH ANESTHESIA;  Surgeon: Luanne Bras, MD;  Location: [place];  Service: Radiology;  Laterality: N/A;  . TONSILLECTOMY    . TUBAL LIGATION    . UMBILICAL HERNIA REPAIR N/A 01/25/2016   Procedure: LAPAROSCOPIC UMBILICAL HERNIA;  Surgeon: Ladora Daniel, MD;  Location: [facility] OR[facility];  Service: General;  Laterality: N/A;  . UMBILICAL HERNIA REPAIR N/A 10/20/2016   Procedure: HERNIA REPAIR UMBILICAL ADULT;  Surgeon: Leonie Green, MD;  Location: [facility] ORS;  Service: General;  Laterality: N/A;   HPI:  [name] is a 48 y.o. female with history of morbid obesity, DB, HTN, migraine, asthma, GERD who fell at home and refused to go to  the hospital. Later she o of HA and noted to be flaccid on her L side. EMS was called. Enrolled in the [employer]. Received tenecteplase at 1334 on 07/15/2019. R MCA infarct s/p tenecteplase ([employer]) and IR w/ revascularization M1 and R ICA stent, now occluded - thrombembolic in setting of large vessel disease source. Intubated from 11/24-11/25.    Assessment / Plan / Recommendation Clinical  Impression  SLP recieved pt upright in chair. Pt had recently completed PT and transfered to chair. Initially pt reported that she was drowsy d/t recent pain medication. However, nursing reported that pt had not received any pain medication as yet. SLP further attempted session and pt was agreeable to session and stated that she was infact fatgiued from PT session and getting out of bed. Given fatigue and that pt was coming in to room in a few minutes, pt's cognition was assessed utilizing MOCA version 7.1. Pt received score of 27 out of 300 (n=> 26) with good ability to demonstrate alertness thorughout administration. Pt with left inattention/neglect during administration of MOCA but was independently able to locate water to her left when she was thirsty. Pt able to effectively recall mediciations that she was taking prior to admission. Would recommend further assessment of higher level cognitive abilities at next venue of care.     SLP Assessment  SLP Recommendation/Assessment: All further Speech Lanaguage Pathology  needs can be addressed in the next venue of care SLP Visit Diagnosis: Cognitive communication deficit (R41.841)    Follow Up Recommendations  Inpatient Rehab    Frequency and Duration   N/A        SLP Evaluation Cognition  Overall Cognitive Status: Impaired/Different from baseline Arousal/Alertness: Awake/alert Orientation Level: Oriented X4 Attention: Selective Selective Attention: Impaired Selective Attention Impairment: Verbal complex;Functional complex Memory: Appears intact Awareness: Appears intact Problem Solving: Appears intact Behaviors: Impulsive Safety/Judgment: Appears intact       Comprehension  Auditory Comprehension Overall Auditory Comprehension: Appears within functional limits for tasks assessed Yes/No Questions: Within Functional Limits Commands: Within Functional Limits Conversation: Complex Visual Recognition/Discrimination Discrimination: Not  tested Reading Comprehension Reading Status: Not tested    Expression Expression Primary Mode of Expression: Verbal Verbal Expression Overall Verbal Expression: Appears within functional limits for tasks assessed Written Expression Dominant Hand: Right Written Expression: Within Functional Limits   Oral / Motor  Oral Motor/Sensory Function Overall Oral Motor/Sensory Function: Mild impairment Facial ROM: Reduced left Facial Symmetry: Abnormal symmetry left Facial Strength: Reduced left Facial Sensation: Reduced left Lingual ROM: Within Functional Limits Lingual Symmetry: Within Functional Limits Lingual Strength: Within Functional Limits Lingual Sensation: Within Functional Limits Velum: Within Functional Limits Mandible: Within Functional Limits Motor Speech Overall Motor Speech: Appears within functional limits for tasks assessed Respiration: Within functional limits Phonation: Normal Resonance: Within functional limits Articulation: Within functional limitis Intelligibility: Intelligible Motor Planning: Witnin functional limits Motor Speech Errors: Not applicable   GO                    Evon Lopezperez 07/18/2019, 12:59 PM

## 2019-07-18 NOTE — Progress Notes (Signed)
Pt moving during MRI. Went into room to speak to her and she requested to be taken out of scanner and said that she was nauseous. We offered to call her nurse for meds and she refused saying, "I don't think I can do that right now." She is aware that she may have to come back.

## 2019-07-18 NOTE — Progress Notes (Signed)
Referring Physician(s): Dr. Leonel Ramsay  Supervising Physician: Luanne Bras  Patient Status:  [facility] - In-pt  Chief Complaint: Follow up right MCA M1 thrombectomy and repair of right ICA occlusive dissection with flow diverter device 07/15/19.  Subjective:  Patient sitting up in chair, sleeping, breakfast tray with a few bites taken but essentially untouched. Patient arouses to loud verbal cues and answers questions appropriately, however falls back to sleep quickly. She states her left side is still weak and she is tired, but denies any other complaints.   Allergies: Azithromycin, Ivp dye [iodinated diagnostic agents], Lexapro [escitalopram], and Vicodin [hydrocodone-acetaminophen]  Medications: Prior to Admission medications   Medication Sig Start Date End Date Taking? Authorizing Provider  albuterol (PROVENTIL HFA;VENTOLIN HFA) 108 (90 Base) MCG/ACT inhaler Inhale 2 puffs into the lungs every 6 (six) hours as needed for wheezing or shortness of breath.   Yes [provider]  amphetamine-dextroamphetamine (ADDERALL) 10 MG tablet Take 10 mg by mouth See admin instructions. Take 10 mg by mouth daily Monday through Friday. 05/28/17  Yes [provider]  buPROPion (WELLBUTRIN) 75 MG tablet Take 75 mg by mouth 2 (two) times daily.   Yes [provider]  Calcium Carb-Cholecalciferol (CALCIUM + VITAMIN D3 PO) Take 1 tablet by mouth daily.   Yes [provider]  fexofenadine (ALLEGRA) 180 MG tablet Take 180 mg by mouth daily as needed for allergies.    Yes [provider]  fluticasone (FLONASE) 50 MCG/ACT nasal spray Place 2 sprays into both nostrils daily as needed for allergies or rhinitis.    Yes [provider]  liraglutide (VICTOZA) 18 MG/3ML SOPN Inject 1.2 mg into the skin daily.   Yes [provider]  losartan-hydrochlorothiazide (HYZAAR) 100-25 MG tablet Take 1 tablet by mouth every morning.    Yes [provider]  metFORMIN (GLUCOPHAGE) 500 MG tablet Take 500 mg by mouth 2 (two) times daily. 06/20/18  Yes [provider]  Multiple Vitamins-Minerals (MULTIVITAMIN PO) Take 2 tablets by mouth daily.   Yes [provider]  NON FORMULARY 1 Dose once a week. Wednesdays   Yes [provider]  omeprazole (PRILOSEC) 20 MG capsule Take 20 mg by mouth daily. 11/11/18 11/11/19 Yes [provider]  PREDNISONE PO Take 1 tablet by mouth as needed (headache).   Yes [provider]  traMADol (ULTRAM) 50 MG tablet Take 1 tablet (50 mg total) by mouth every 6 (six) hours as needed for moderate pain. 07/04/18  Yes Poggi, Marshall Cork, MD  triamterene-hydrochlorothiazide (DYAZIDE) 37.5-25 MG capsule Take 1 capsule by mouth every morning.    Yes [provider]     Vital Signs: BP 136/84    Pulse 66    Temp 97.9 F (36.6 C) (Oral)    Resp 16    Ht 5\' 5"  (1.651 m)    Wt 274 lb 4 oz (124.4 kg)    SpO2 100%    BMI 45.64 kg/m   Physical Exam Vitals signs and nursing note reviewed.  Constitutional:      General: She is not in acute distress.    Comments: Somnolent - arouses to verbal cues but quickly returns to sleep. Difficulty participating in exam due to somnolence.  Cardiovascular:     Rate and Rhythm: Normal rate and regular rhythm.  Pulmonary:     Effort: Pulmonary effort is normal.     Breath sounds: Normal breath sounds.  Abdominal:     General: There is no  distension.     Palpations: Abdomen is soft.     Tenderness: There is no abdominal tenderness.  Skin:    General: Skin is warm and dry.     Comments: (+) Right CFA puncture site clean, dry, dressed appropriately. Soft, no obvious hematoma or active bleeding.    Somnolent, answers questions appropriately but returns to sleep quickly - able to state name, birthday, location. Speech and comprehension in tact PERRL not assess 2/2 somnolence EOMs not assessed 2/2 somnolence Visual fields not  assessed No obvious facial asymmetry. Motor power - LUE 2/5, LLE 2/5, RUE/RLE 5/5 Fine motor and coordination not assessed 2/2 somnolence Gait not assessed Romberg not assessed Heel to toe not assessed Distal pulses faint but palpable bilaterally.    Imaging: Ct Angio Head W Or Wo Contrast  Result Date: 07/16/2019 CLINICAL DATA:  Stroke. Endovascular revascularization on the right 07/15/2019 EXAM: CT ANGIOGRAPHY HEAD AND NECK CT PERFUSION BRAIN TECHNIQUE: Multidetector CT imaging of the head and neck was performed using the standard protocol during bolus administration of intravenous contrast. Multiplanar CT image reconstructions and MIPs were obtained to evaluate the vascular anatomy. Carotid stenosis measurements (when applicable) are obtained utilizing NASCET criteria, using the distal internal carotid diameter as the denominator. Multiphase CT imaging of the brain was performed following IV bolus contrast injection. Subsequent parametric perfusion maps were calculated using RAPID software. CONTRAST:  128mL OMNIPAQUE IOHEXOL 350 MG/ML SOLN COMPARISON:  CT a head and neck 07/15/2019 FINDINGS: CTA NECK FINDINGS Aortic arch: Bovine branching pattern of the arch. Aortic arch and proximal great vessels appear normal. Right carotid system: Right common carotid artery normal. Occlusion of the proximal right internal carotid artery. Above the occluded segment, there is a stent which is occluded. There is reconstitution of the supraclinoid internal carotid artery on the right. Right external carotid artery is patent. Left carotid system: Left carotid widely patent without stenosis or irregularity. Vertebral arteries: Both vertebral arteries patent to the basilar. Left vertebral artery is dominant. Skeleton: No acute skeletal abnormality. Other neck: Patient is intubated. NG tube also in place. No soft tissue mass or edema in the neck. Upper chest: Mild atelectasis or scarring in the left upper lobe. No acute  abnormality. Review of the MIP images confirms the above findings CTA HEAD FINDINGS Anterior circulation: Right internal carotid artery is occluded and reconstitutes in the supraclinoid segment. Right M1 segment widely patent. Right M2 segments patent. Previously noted thrombus in the right M1 and M2 segments on the prior CTA yesterday no longer present. Anterior cerebral arteries patent bilaterally. Left MCA patent without significant stenosis. Posterior circulation: Both vertebral arteries patent to the basilar. Basilar widely patent. Superior cerebellar and posterior cerebral arteries patent bilaterally. Venous sinuses: Patent Anatomic variants: None Review of the MIP images confirms the above findings CT Brain Perfusion Findings: CBF (<30%) Volume: 32mL Perfusion (Tmax>6.0s) volume: 67mL Mismatch Volume: 16mL Infarction Location:None IMPRESSION: 1. CT perfusion negative for acute infarct or ischemia. Interval normalization of infarct in the right basal ganglia with ischemia in the right MCA territory on CT perfusion yesterday. 2. Reocclusion of right internal carotid artery near the origin. Right internal carotid artery stent is occluded. Right MCA now widely patent. 3. Left carotid and both vertebral arteries widely patent. Electronically Signed   By: Franchot Gallo M.D.   On: 07/16/2019 11:27   Ct Angio Head W Or Wo Contrast  Result Date: 07/15/2019 CLINICAL DATA:  Acute onset of left-sided weakness. Abnormal CT of the head. EXAM:  CT ANGIOGRAPHY HEAD AND NECK CT PERFUSION BRAIN TECHNIQUE: Multidetector CT imaging of the head and neck was performed using the standard protocol during bolus administration of intravenous contrast. Multiplanar CT image reconstructions and MIPs were obtained to evaluate the vascular anatomy. Carotid stenosis measurements (when applicable) are obtained utilizing NASCET criteria, using the distal internal carotid diameter as the denominator. Multiphase CT imaging of the brain was  performed following IV bolus contrast injection. Subsequent parametric perfusion maps were calculated using RAPID software. CONTRAST:  174mL OMNIPAQUE IOHEXOL 350 MG/ML SOLN COMPARISON:  CT head without contrast 07/15/19 FINDINGS: CTA NECK FINDINGS Aortic arch: There is a common origin of the left common carotid artery in the innominate artery, a normal variant. Aorta is otherwise within normal limits. No significant atherosclerotic disease is present. Right carotid system: The right common carotid artery is within normal limits. The bifurcation is unremarkable. There is moderate tortuosity in the mid cervical right ICA. The mid cervical right ICA is occluded without reconstitution in the neck. Left carotid system: The left common carotid artery is within normal limits. The bifurcation is unremarkable. There is moderate tortuosity in the mid cervical left ICA without significant stenosis. Vertebral arteries: The left vertebral artery is the dominant vessel. Both vertebral arteries originate from the subclavian arteries without significant stenosis. There is no significant stenosis of either vertebral artery in the neck. Skeleton: Vertebral body heights alignment are maintained. Mild degenerative changes are most evident at C3-4 and C5-6. Other neck: The tissues the neck are otherwise unremarkable. No focal mucosal or submucosal lesions are present. Thyroid is within normal limits. No significant adenopathy is present. Salivary glands are normal. Upper chest: Lung apices are clear.  Thoracic inlet is normal. Review of the MIP images confirms the above findings CTA HEAD FINDINGS Anterior circulation: There is no reconstitution of the right internal carotid artery before the ICA terminus. There is flow from the left which extends into the proximal right M1 segment. There is some collateralization to posterior right MCA branches. The left MCA bifurcation is within normal limits. Proximal ACA vessels are bilaterally.  Distal branch vessel narrowing is present in the ACA vessels and right MCA vessels. Posterior circulation: The left vertebral artery is dominant. There is a moderate stenosis of the right V3 segment proximal to the PICA origin. Right PICA is present. V4 segment is hypoplastic or stenotic. The basilar artery is normal. Both posterior cerebral arteries originate the basilar tip. Proximal PCA branch vessels are within normal limits. There is attenuation of distal PCA branch vessels bilaterally. Venous sinuses: Dural sinuses are patent. Straight sinus and deep cerebral veins are intact. Cortical veins are unremarkable. Anatomic variants: NONE Review of the MIP images confirms the above findings CT Brain Perfusion Findings: ASPECTS: 9/10 CBF (<30%) Volume: 3mL Perfusion (Tmax>6.0s) volume: 132mL Mismatch Volume: 172mL Infarction Location:RIGHT MCA TERRITORY IMPRESSION: 1. Occluded right internal carotid artery without reconstitution at the neck or skull base. 2. There is some flow into the proximal right M1 segment via a patent anterior communicating artery and right A1 segment. 3. There is some collateral position of right MCA branch vessels. 4. CT perfusion study demonstrates in ischemic penumbra involving most of the right MCA territory with a large mismatch volume of 142 mL. 5. Core infarct corresponds to the noncontrast CT of the head with involvement of the right lentiform nucleus. 6. No acute hemorrhage. 7. Diffuse distal small vessel disease. 8. Tortuosity of the cervical internal carotid arteries bilaterally. This is nonspecific, but often seen  in the setting of longstanding hypertension. These results were called by telephone at the time of interpretation on 07/15/2019 at 12:50 pm to provider PRAMOD [facility] , who verbally acknowledged these results. Electronically Signed   By: San Morelle M.D.   On: 07/15/2019 12:53   Dg Abd 1 View  Result Date: 07/15/2019 CLINICAL DATA:  Orogastric tube placement.  EXAM: ABDOMEN - 1 VIEW COMPARISON:  CT 04/07/2016. FINDINGS: Orogastric tube tip noted over the stomach. No gastric or bowel distention. Right atelectatic changes and/or infiltrate. Degenerative changes lumbar spine. Contrast in the kidneys. IMPRESSION: Orogastric tube tip noted over the stomach. No gastric or bowel distention. 2. Atelectatic changes and/or infiltrate right lung base. Electronically Signed   By: Marcello Moores  Register   On: 07/15/2019 17:21   Ct Head Wo Contrast  Result Date: 07/16/2019 CLINICAL DATA:  Stroke post revascularization EXAM: CT HEAD WITHOUT CONTRAST TECHNIQUE: Contiguous axial images were obtained from the base of the skull through the vertex without intravenous contrast. COMPARISON:  July 15, 2019 FINDINGS: Brain: There is better delineation of hypoattenuation and loss of gray-white differentiation involving the right basal ganglia, posterior insula, and superior right temporal lobe. No acute intracranial hemorrhage. Mild mass effect on the right frontal horn. Vascular: No new abnormality. Skull: Unremarkable. Sinuses/Orbits: Mild mucosal thickening.  Orbits are unremarkable. Other: Mastoid air cells are clear. IMPRESSION: Evolving recent infarction involving the right basal ganglia and adjacent white matter, posterior insula, and superior right temporal lobe. No acute intracranial hemorrhage or significant mass effect. Electronically Signed   By: Macy Mis M.D.   On: 07/16/2019 15:14   Ct Head Wo Contrast  Result Date: 07/15/2019 CLINICAL DATA:  Stroke follow-up EXAM: CT HEAD WITHOUT CONTRAST TECHNIQUE: Contiguous axial images were obtained from the base of the skull through the vertex without intravenous contrast. COMPARISON:  Head CT 07/15/2019 at 12:10 p.m. FINDINGS: Brain: There is no mass, hemorrhage or extra-axial collection. The size and configuration of the ventricles and extra-axial CSF spaces are normal. Unchanged mild hypoattenuation of the right basal ganglia,  compatible with core infarct. Vascular: No abnormal hyperdensity of the major intracranial arteries or dural venous sinuses. No intracranial atherosclerosis. Skull: The visualized skull base, calvarium and extracranial soft tissues are normal. Sinuses/Orbits: No fluid levels or advanced mucosal thickening of the visualized paranasal sinuses. No mastoid or middle ear effusion. The orbits are normal. IMPRESSION: No acute hemorrhage. Electronically Signed   By: Ulyses Jarred M.D.   On: 07/15/2019 21:04   Ct Angio Neck W Or Wo Contrast  Result Date: 07/16/2019 CLINICAL DATA:  Stroke. Endovascular revascularization on the right 07/15/2019 EXAM: CT ANGIOGRAPHY HEAD AND NECK CT PERFUSION BRAIN TECHNIQUE: Multidetector CT imaging of the head and neck was performed using the standard protocol during bolus administration of intravenous contrast. Multiplanar CT image reconstructions and MIPs were obtained to evaluate the vascular anatomy. Carotid stenosis measurements (when applicable) are obtained utilizing NASCET criteria, using the distal internal carotid diameter as the denominator. Multiphase CT imaging of the brain was performed following IV bolus contrast injection. Subsequent parametric perfusion maps were calculated using RAPID software. CONTRAST:  181mL OMNIPAQUE IOHEXOL 350 MG/ML SOLN COMPARISON:  CT a head and neck 07/15/2019 FINDINGS: CTA NECK FINDINGS Aortic arch: Bovine branching pattern of the arch. Aortic arch and proximal great vessels appear normal. Right carotid system: Right common carotid artery normal. Occlusion of the proximal right internal carotid artery. Above the occluded segment, there is a stent which is occluded. There is reconstitution of  the supraclinoid internal carotid artery on the right. Right external carotid artery is patent. Left carotid system: Left carotid widely patent without stenosis or irregularity. Vertebral arteries: Both vertebral arteries patent to the basilar. Left  vertebral artery is dominant. Skeleton: No acute skeletal abnormality. Other neck: Patient is intubated. NG tube also in place. No soft tissue mass or edema in the neck. Upper chest: Mild atelectasis or scarring in the left upper lobe. No acute abnormality. Review of the MIP images confirms the above findings CTA HEAD FINDINGS Anterior circulation: Right internal carotid artery is occluded and reconstitutes in the supraclinoid segment. Right M1 segment widely patent. Right M2 segments patent. Previously noted thrombus in the right M1 and M2 segments on the prior CTA yesterday no longer present. Anterior cerebral arteries patent bilaterally. Left MCA patent without significant stenosis. Posterior circulation: Both vertebral arteries patent to the basilar. Basilar widely patent. Superior cerebellar and posterior cerebral arteries patent bilaterally. Venous sinuses: Patent Anatomic variants: None Review of the MIP images confirms the above findings CT Brain Perfusion Findings: CBF (<30%) Volume: 72mL Perfusion (Tmax>6.0s) volume: 51mL Mismatch Volume: 37mL Infarction Location:None IMPRESSION: 1. CT perfusion negative for acute infarct or ischemia. Interval normalization of infarct in the right basal ganglia with ischemia in the right MCA territory on CT perfusion yesterday. 2. Reocclusion of right internal carotid artery near the origin. Right internal carotid artery stent is occluded. Right MCA now widely patent. 3. Left carotid and both vertebral arteries widely patent. Electronically Signed   By: Franchot Gallo M.D.   On: 07/16/2019 11:27   Ct Angio Neck W Or Wo Contrast  Result Date: 07/15/2019 CLINICAL DATA:  Acute onset of left-sided weakness. Abnormal CT of the head. EXAM: CT ANGIOGRAPHY HEAD AND NECK CT PERFUSION BRAIN TECHNIQUE: Multidetector CT imaging of the head and neck was performed using the standard protocol during bolus administration of intravenous contrast. Multiplanar CT image reconstructions and  MIPs were obtained to evaluate the vascular anatomy. Carotid stenosis measurements (when applicable) are obtained utilizing NASCET criteria, using the distal internal carotid diameter as the denominator. Multiphase CT imaging of the brain was performed following IV bolus contrast injection. Subsequent parametric perfusion maps were calculated using RAPID software. CONTRAST:  120mL OMNIPAQUE IOHEXOL 350 MG/ML SOLN COMPARISON:  CT head without contrast 07/15/19 FINDINGS: CTA NECK FINDINGS Aortic arch: There is a common origin of the left common carotid artery in the innominate artery, a normal variant. Aorta is otherwise within normal limits. No significant atherosclerotic disease is present. Right carotid system: The right common carotid artery is within normal limits. The bifurcation is unremarkable. There is moderate tortuosity in the mid cervical right ICA. The mid cervical right ICA is occluded without reconstitution in the neck. Left carotid system: The left common carotid artery is within normal limits. The bifurcation is unremarkable. There is moderate tortuosity in the mid cervical left ICA without significant stenosis. Vertebral arteries: The left vertebral artery is the dominant vessel. Both vertebral arteries originate from the subclavian arteries without significant stenosis. There is no significant stenosis of either vertebral artery in the neck. Skeleton: Vertebral body heights alignment are maintained. Mild degenerative changes are most evident at C3-4 and C5-6. Other neck: The tissues the neck are otherwise unremarkable. No focal mucosal or submucosal lesions are present. Thyroid is within normal limits. No significant adenopathy is present. Salivary glands are normal. Upper chest: Lung apices are clear.  Thoracic inlet is normal. Review of the MIP images confirms the above findings CTA  HEAD FINDINGS Anterior circulation: There is no reconstitution of the right internal carotid artery before the ICA  terminus. There is flow from the left which extends into the proximal right M1 segment. There is some collateralization to posterior right MCA branches. The left MCA bifurcation is within normal limits. Proximal ACA vessels are bilaterally. Distal branch vessel narrowing is present in the ACA vessels and right MCA vessels. Posterior circulation: The left vertebral artery is dominant. There is a moderate stenosis of the right V3 segment proximal to the PICA origin. Right PICA is present. V4 segment is hypoplastic or stenotic. The basilar artery is normal. Both posterior cerebral arteries originate the basilar tip. Proximal PCA branch vessels are within normal limits. There is attenuation of distal PCA branch vessels bilaterally. Venous sinuses: Dural sinuses are patent. Straight sinus and deep cerebral veins are intact. Cortical veins are unremarkable. Anatomic variants: NONE Review of the MIP images confirms the above findings CT Brain Perfusion Findings: ASPECTS: 9/10 CBF (<30%) Volume: 60mL Perfusion (Tmax>6.0s) volume: 123mL Mismatch Volume: 143mL Infarction Location:RIGHT MCA TERRITORY IMPRESSION: 1. Occluded right internal carotid artery without reconstitution at the neck or skull base. 2. There is some flow into the proximal right M1 segment via a patent anterior communicating artery and right A1 segment. 3. There is some collateral position of right MCA branch vessels. 4. CT perfusion study demonstrates in ischemic penumbra involving most of the right MCA territory with a large mismatch volume of 142 mL. 5. Core infarct corresponds to the noncontrast CT of the head with involvement of the right lentiform nucleus. 6. No acute hemorrhage. 7. Diffuse distal small vessel disease. 8. Tortuosity of the cervical internal carotid arteries bilaterally. This is nonspecific, but often seen in the setting of longstanding hypertension. These results were called by telephone at the time of interpretation on 07/15/2019 at  12:50 pm to provider PRAMOD [facility] , who verbally acknowledged these results. Electronically Signed   By: San Morelle M.D.   On: 07/15/2019 12:53   Mr Brain Wo Contrast  Result Date: 07/17/2019 CLINICAL DATA:  Stroke.  Stroke intervention 07/15/2019 EXAM: MRI HEAD WITHOUT CONTRAST TECHNIQUE: Multiplanar, multiecho pulse sequences of the brain and surrounding structures were obtained without intravenous contrast. COMPARISON:  CT head 07/16/2019 FINDINGS: Brain: Acute infarct in the right basal ganglia involving the lenticular nuclei, and the caudate nucleus. Anterior limb internal capsule also involved. Acute infarct in the insula, right superior temporal lobe. Small amount of acute infarct in the right frontal lobe. Hemorrhagic transformation with mild amount of hemorrhage in the right caudate and right lenticular nuclei. Ventricle size normal. No midline shift. Mild chronic microvascular ischemic changes in the white matter. Vascular: Loss of flow void in the right internal carotid artery compatible with occlusion as noted on CTA. Otherwise normal flow voids. Skull and upper cervical spine: Negative Sinuses/Orbits: Mild mucosal edema paranasal sinuses. Negative orbit Other: None IMPRESSION: Acute infarct in the right basal ganglia with hemorrhagic transformation in the right caudate and right lenticular nucleus. Acute infarct also extends into the insula and right temporal lobe. Small acute infarct in the right frontal lobe. Occluded right internal carotid artery. Electronically Signed   By: Franchot Gallo M.D.   On: 07/17/2019 10:32   Ct Cerebral Perfusion W Contrast  Result Date: 07/16/2019 CLINICAL DATA:  Stroke. Endovascular revascularization on the right 07/15/2019 EXAM: CT ANGIOGRAPHY HEAD AND NECK CT PERFUSION BRAIN TECHNIQUE: Multidetector CT imaging of the head and neck was performed using the standard protocol during  bolus administration of intravenous contrast. Multiplanar CT image  reconstructions and MIPs were obtained to evaluate the vascular anatomy. Carotid stenosis measurements (when applicable) are obtained utilizing NASCET criteria, using the distal internal carotid diameter as the denominator. Multiphase CT imaging of the brain was performed following IV bolus contrast injection. Subsequent parametric perfusion maps were calculated using RAPID software. CONTRAST:  146mL OMNIPAQUE IOHEXOL 350 MG/ML SOLN COMPARISON:  CT a head and neck 07/15/2019 FINDINGS: CTA NECK FINDINGS Aortic arch: Bovine branching pattern of the arch. Aortic arch and proximal great vessels appear normal. Right carotid system: Right common carotid artery normal. Occlusion of the proximal right internal carotid artery. Above the occluded segment, there is a stent which is occluded. There is reconstitution of the supraclinoid internal carotid artery on the right. Right external carotid artery is patent. Left carotid system: Left carotid widely patent without stenosis or irregularity. Vertebral arteries: Both vertebral arteries patent to the basilar. Left vertebral artery is dominant. Skeleton: No acute skeletal abnormality. Other neck: Patient is intubated. NG tube also in place. No soft tissue mass or edema in the neck. Upper chest: Mild atelectasis or scarring in the left upper lobe. No acute abnormality. Review of the MIP images confirms the above findings CTA HEAD FINDINGS Anterior circulation: Right internal carotid artery is occluded and reconstitutes in the supraclinoid segment. Right M1 segment widely patent. Right M2 segments patent. Previously noted thrombus in the right M1 and M2 segments on the prior CTA yesterday no longer present. Anterior cerebral arteries patent bilaterally. Left MCA patent without significant stenosis. Posterior circulation: Both vertebral arteries patent to the basilar. Basilar widely patent. Superior cerebellar and posterior cerebral arteries patent bilaterally. Venous sinuses:  Patent Anatomic variants: None Review of the MIP images confirms the above findings CT Brain Perfusion Findings: CBF (<30%) Volume: 43mL Perfusion (Tmax>6.0s) volume: 88mL Mismatch Volume: 97mL Infarction Location:None IMPRESSION: 1. CT perfusion negative for acute infarct or ischemia. Interval normalization of infarct in the right basal ganglia with ischemia in the right MCA territory on CT perfusion yesterday. 2. Reocclusion of right internal carotid artery near the origin. Right internal carotid artery stent is occluded. Right MCA now widely patent. 3. Left carotid and both vertebral arteries widely patent. Electronically Signed   By: Franchot Gallo M.D.   On: 07/16/2019 11:27   Ct Cerebral Perfusion W Contrast  Result Date: 07/15/2019 CLINICAL DATA:  Acute onset of left-sided weakness. Abnormal CT of the head. EXAM: CT ANGIOGRAPHY HEAD AND NECK CT PERFUSION BRAIN TECHNIQUE: Multidetector CT imaging of the head and neck was performed using the standard protocol during bolus administration of intravenous contrast. Multiplanar CT image reconstructions and MIPs were obtained to evaluate the vascular anatomy. Carotid stenosis measurements (when applicable) are obtained utilizing NASCET criteria, using the distal internal carotid diameter as the denominator. Multiphase CT imaging of the brain was performed following IV bolus contrast injection. Subsequent parametric perfusion maps were calculated using RAPID software. CONTRAST:  151mL OMNIPAQUE IOHEXOL 350 MG/ML SOLN COMPARISON:  CT head without contrast 07/15/19 FINDINGS: CTA NECK FINDINGS Aortic arch: There is a common origin of the left common carotid artery in the innominate artery, a normal variant. Aorta is otherwise within normal limits. No significant atherosclerotic disease is present. Right carotid system: The right common carotid artery is within normal limits. The bifurcation is unremarkable. There is moderate tortuosity in the mid cervical right ICA.  The mid cervical right ICA is occluded without reconstitution in the neck. Left carotid system: The left common  carotid artery is within normal limits. The bifurcation is unremarkable. There is moderate tortuosity in the mid cervical left ICA without significant stenosis. Vertebral arteries: The left vertebral artery is the dominant vessel. Both vertebral arteries originate from the subclavian arteries without significant stenosis. There is no significant stenosis of either vertebral artery in the neck. Skeleton: Vertebral body heights alignment are maintained. Mild degenerative changes are most evident at C3-4 and C5-6. Other neck: The tissues the neck are otherwise unremarkable. No focal mucosal or submucosal lesions are present. Thyroid is within normal limits. No significant adenopathy is present. Salivary glands are normal. Upper chest: Lung apices are clear.  Thoracic inlet is normal. Review of the MIP images confirms the above findings CTA HEAD FINDINGS Anterior circulation: There is no reconstitution of the right internal carotid artery before the ICA terminus. There is flow from the left which extends into the proximal right M1 segment. There is some collateralization to posterior right MCA branches. The left MCA bifurcation is within normal limits. Proximal ACA vessels are bilaterally. Distal branch vessel narrowing is present in the ACA vessels and right MCA vessels. Posterior circulation: The left vertebral artery is dominant. There is a moderate stenosis of the right V3 segment proximal to the PICA origin. Right PICA is present. V4 segment is hypoplastic or stenotic. The basilar artery is normal. Both posterior cerebral arteries originate the basilar tip. Proximal PCA branch vessels are within normal limits. There is attenuation of distal PCA branch vessels bilaterally. Venous sinuses: Dural sinuses are patent. Straight sinus and deep cerebral veins are intact. Cortical veins are unremarkable. Anatomic  variants: NONE Review of the MIP images confirms the above findings CT Brain Perfusion Findings: ASPECTS: 9/10 CBF (<30%) Volume: 17mL Perfusion (Tmax>6.0s) volume: 116mL Mismatch Volume: 160mL Infarction Location:RIGHT MCA TERRITORY IMPRESSION: 1. Occluded right internal carotid artery without reconstitution at the neck or skull base. 2. There is some flow into the proximal right M1 segment via a patent anterior communicating artery and right A1 segment. 3. There is some collateral position of right MCA branch vessels. 4. CT perfusion study demonstrates in ischemic penumbra involving most of the right MCA territory with a large mismatch volume of 142 mL. 5. Core infarct corresponds to the noncontrast CT of the head with involvement of the right lentiform nucleus. 6. No acute hemorrhage. 7. Diffuse distal small vessel disease. 8. Tortuosity of the cervical internal carotid arteries bilaterally. This is nonspecific, but often seen in the setting of longstanding hypertension. These results were called by telephone at the time of interpretation on 07/15/2019 at 12:50 pm to provider PRAMOD [facility] , who verbally acknowledged these results. Electronically Signed   By: San Morelle M.D.   On: 07/15/2019 12:53   Portable Chest Xray  Result Date: 07/16/2019 CLINICAL DATA:  Intubation. EXAM: PORTABLE CHEST 1 VIEW COMPARISON:  Chest x-ray 07/15/2019. FINDINGS: Endotracheal tube and NG tube in stable position. Heart size stable. No pulmonary venous congestion low lung volumes with bibasilar atelectasis. No pleural effusion or pneumothorax. IMPRESSION: 1.  Endotracheal tube and NG tube in stable position. 2. Low lung volumes with mild bibasilar atelectasis again noted without interim change. Electronically Signed   By: Marcello Moores  Register   On: 07/16/2019 07:35   Dg Chest Port 1 View  Result Date: 07/15/2019 CLINICAL DATA:  Hypoxia EXAM: PORTABLE CHEST 1 VIEW COMPARISON:  October 20, 2015 FINDINGS: Endotracheal tube  tip is 3.0 cm above the carina. Nasogastric tube tip and side port are below the diaphragm.  No pneumothorax. There is mild bibasilar atelectasis. No edema or consolidation. Heart is upper normal in size with pulmonary vascularity normal. No adenopathy. No bone lesions. IMPRESSION: Tube positions as described without pneumothorax. Mild bibasilar atelectasis. No edema or consolidation. Heart upper normal in size. Electronically Signed   By: Lowella Grip III M.D.   On: 07/15/2019 17:25   Ir Percutaneous Art Thrombectomy/infusion Intracranial Inc Diag Angio  Result Date: 07/15/2019 CLINICAL DATA:  Acute onset of left-sided weakness. Abnormal CT of the head. EXAM: CT ANGIOGRAPHY HEAD AND NECK CT PERFUSION BRAIN TECHNIQUE: Multidetector CT imaging of the head and neck was performed using the standard protocol during bolus administration of intravenous contrast. Multiplanar CT image reconstructions and MIPs were obtained to evaluate the vascular anatomy. Carotid stenosis measurements (when applicable) are obtained utilizing NASCET criteria, using the distal internal carotid diameter as the denominator. Multiphase CT imaging of the brain was performed following IV bolus contrast injection. Subsequent parametric perfusion maps were calculated using RAPID software. CONTRAST:  135mL OMNIPAQUE IOHEXOL 350 MG/ML SOLN COMPARISON:  CT head without contrast 07/15/19 FINDINGS: CTA NECK FINDINGS Aortic arch: There is a common origin of the left common carotid artery in the innominate artery, a normal variant. Aorta is otherwise within normal limits. No significant atherosclerotic disease is present. Right carotid system: The right common carotid artery is within normal limits. The bifurcation is unremarkable. There is moderate tortuosity in the mid cervical right ICA. The mid cervical right ICA is occluded without reconstitution in the neck. Left carotid system: The left common carotid artery is within normal limits. The  bifurcation is unremarkable. There is moderate tortuosity in the mid cervical left ICA without significant stenosis. Vertebral arteries: The left vertebral artery is the dominant vessel. Both vertebral arteries originate from the subclavian arteries without significant stenosis. There is no significant stenosis of either vertebral artery in the neck. Skeleton: Vertebral body heights alignment are maintained. Mild degenerative changes are most evident at C3-4 and C5-6. Other neck: The tissues the neck are otherwise unremarkable. No focal mucosal or submucosal lesions are present. Thyroid is within normal limits. No significant adenopathy is present. Salivary glands are normal. Upper chest: Lung apices are clear.  Thoracic inlet is normal. Review of the MIP images confirms the above findings CTA HEAD FINDINGS Anterior circulation: There is no reconstitution of the right internal carotid artery before the ICA terminus. There is flow from the left which extends into the proximal right M1 segment. There is some collateralization to posterior right MCA branches. The left MCA bifurcation is within normal limits. Proximal ACA vessels are bilaterally. Distal branch vessel narrowing is present in the ACA vessels and right MCA vessels. Posterior circulation: The left vertebral artery is dominant. There is a moderate stenosis of the right V3 segment proximal to the PICA origin. Right PICA is present. V4 segment is hypoplastic or stenotic. The basilar artery is normal. Both posterior cerebral arteries originate the basilar tip. Proximal PCA branch vessels are within normal limits. There is attenuation of distal PCA branch vessels bilaterally. Venous sinuses: Dural sinuses are patent. Straight sinus and deep cerebral veins are intact. Cortical veins are unremarkable. Anatomic variants: NONE Review of the MIP images confirms the above findings CT Brain Perfusion Findings: ASPECTS: 9/10 CBF (<30%) Volume: 49mL Perfusion (Tmax>6.0s)  volume: 194mL Mismatch Volume: 113mL Infarction Location:RIGHT MCA TERRITORY IMPRESSION: 1. Occluded right internal carotid artery without reconstitution at the neck or skull base. 2. There is some flow into the proximal right M1  segment via a patent anterior communicating artery and right A1 segment. 3. There is some collateral position of right MCA branch vessels. 4. CT perfusion study demonstrates in ischemic penumbra involving most of the right MCA territory with a large mismatch volume of 142 mL. 5. Core infarct corresponds to the noncontrast CT of the head with involvement of the right lentiform nucleus. 6. No acute hemorrhage. 7. Diffuse distal small vessel disease. 8. Tortuosity of the cervical internal carotid arteries bilaterally. This is nonspecific, but often seen in the setting of longstanding hypertension. These results were called by telephone at the time of interpretation on 07/15/2019 at 12:50 pm to provider PRAMOD [facility] , who verbally acknowledged these results. Electronically Signed   By: San Morelle M.D.   On: 07/15/2019 12:53   Ct Head Code Stroke Wo Contrast  Result Date: 07/15/2019 CLINICAL DATA:  Code stroke. Possible stroke. Small focal neuro deficit for less than 6 hours. Last known well at 5 a.m. Fall today. EXAM: CT HEAD WITHOUT CONTRAST TECHNIQUE: Contiguous axial images were obtained from the base of the skull through the vertex without intravenous contrast. COMPARISON:  CT head without contrast 07/15/2019 FINDINGS: Brain: The right lentiform nucleus is asymmetrically hypo dense compared to the right. Insular ribbon is preserved. Basal ganglia are otherwise normal. No other focal cortical lesions are present. The brainstem and cerebellum are within normal limits. Vascular: Hyperdense right MCA is present with extension into the M2 branches. No significant vascular calcifications are present. Calvarium is intact. No focal lytic or blastic lesions are present. Skull:  Calvarium is intact. No focal lytic or blastic lesions are present. Sinuses/Orbits: The paranasal sinuses and mastoid air cells are clear. The globes and orbits are within normal limits. ASPECTS [facility] Stroke Program Early CT Score) - Ganglionic level infarction (caudate, lentiform nuclei, internal capsule, insula, M1-M3 cortex): 6/7 - Supraganglionic infarction (M4-M6 cortex): 3/3 Total score (0-10 with 10 being normal): 9/10 IMPRESSION: 1. Acute non hemorrhagic infarct involving the right basal ganglia with hypoattenuation of the right lentiform nucleus. 2. Hyperdense right MCA suggesting thrombus. 3. ASPECTS is 9/10 These results were called by telephone at the time of interpretation on 07/15/2019 at 12:24 pm to provider Roland Rack, who verbally acknowledged these results. Electronically Signed   By: San Morelle M.D.   On: 07/15/2019 12:26    Labs:  CBC: Recent Labs    07/15/19 1225  07/15/19 1714 07/15/19 1944 07/16/19 0450 07/18/19 0559  WBC 10.7*  --   --   --  15.0* 10.3  HGB 11.9*   < > 11.9* 10.5* 9.3* 8.8*  HCT 39.9   < > 35.0* 31.0* 30.8* 28.4*  PLT 315  --   --   --  399 337   < > = values in this interval not displayed.    COAGS: Recent Labs    07/15/19 1225  INR QUESTIONABLE RESULTS, RECOMMEND RECOLLECT TO VERIFY  APTT QUESTIONABLE RESULTS, RECOMMEND RECOLLECT TO VERIFY    BMP: Recent Labs    07/15/19 1225 07/15/19 1233 07/15/19 1714 07/15/19 1944 07/16/19 0450 07/18/19 0559  NA 135 133* 138 135 135 140  K 4.7 4.7 4.0 3.9 3.5 3.6  CL 100 102  --   --  102 106  CO2 20*  --   --   --  22 26  GLUCOSE 235* 241*  --   --  184* 151*  BUN 16 21*  --   --  17 13  CALCIUM 9.4  --   --   --  8.3* 8.5*  CREATININE 0.75 0.50  --   --  0.82 0.68  GFRNONAA >60  --   --   --  >60 >60  GFRAA >60  --   --   --  >60 >60    LIVER FUNCTION TESTS: Recent Labs    07/15/19 1225  BILITOT 0.7  AST 58*  ALT 50*  ALKPHOS 115  PROT 7.0  ALBUMIN 3.8     Assessment and Plan:  48 y/o F s/p right MCA M1 thrombectomy and repair of right ICA occlusive dissection with flow diverter device 07/15/19 seen today for follow up. She remains weak on the left side, difficult neurologic assessment today as patient is quite somnolent. She arouses to verbal cues, follows commands and answers questions appropriately however she falls back to sleep with even a brief pause in conversation. Per chart she received Tramadol prior to our visit today and patient states she did not sleep all night.  MRI brain w/o contrast yesterday shows acute infarct in the right basal ganglia with hemorrhagic transformation in the right caudate and right lenticular nucleus, also extends into the insula and the right temporal lobe, small acute infarct in the right frontal lobe, occluded right ICA. Neurology service is following.  Further plans per neurology/primary team - appears she is planned for inpatient rehab at this time. Recommend continuing ASA 81 mg QD + Brilinta 90 mg BID, she will see [place] 4 weeks post discharge for routine follow up.   Please call with questions or concerns.   Electronically Signed: Joaquim Nam, PA-C 07/18/2019, 9:09 AM   I spent a total of 15 Minutes at the the patient's bedside AND on the patient's hospital floor or unit, greater than 50% of which was counseling/coordinating care for follow right MCA thrombectomy and right ICA repair.

## 2019-07-18 NOTE — Progress Notes (Signed)
Inpatient Rehabilitation-Admissions Coordinator    I met with pt and her husband at the bedside for rehabilitation assessment and to discuss goals and expectations of an inpatient rehab admission. Pt appears to be a great candidate for CIR and is progressing daily. I confirmed assist with her husband who is able to take time off work to provide 24/7 A. Pt and her husband would like to pursue IP Rehab if insurance approves. Pt currently listed as uninsured but per her and her family, she has insurance through [employer]. Will need to have insurance cards in order to open case. The husband plans to bring the cards to the hospital at next visit.   AC will follow up Monday to discuss insurance benefits and begin insurance authorization process for possible admit.   Jhonnie Garner, OTR/L  Rehab Admissions Coordinator  [phone] 07/18/2019 3:24 PM

## 2019-07-19 DIAGNOSIS — E785 Hyperlipidemia, unspecified: Secondary | ICD-10-CM

## 2019-07-19 DIAGNOSIS — E1165 Type 2 diabetes mellitus with hyperglycemia: Secondary | ICD-10-CM

## 2019-07-19 DIAGNOSIS — I7771 Dissection of carotid artery: Secondary | ICD-10-CM

## 2019-07-19 DIAGNOSIS — I10 Essential (primary) hypertension: Secondary | ICD-10-CM

## 2019-07-19 LAB — GLUCOSE, CAPILLARY
Glucose-Capillary: 161 mg/dL — ABNORMAL HIGH (ref 70–99)
Glucose-Capillary: 135 mg/dL — ABNORMAL HIGH (ref 70–99)
Glucose-Capillary: 140 mg/dL — ABNORMAL HIGH (ref 70–99)
Glucose-Capillary: 144 mg/dL — ABNORMAL HIGH (ref 70–99)
Glucose-Capillary: 155 mg/dL — ABNORMAL HIGH (ref 70–99)
Glucose-Capillary: 146 mg/dL — ABNORMAL HIGH (ref 70–99)

## 2019-07-19 LAB — CBC
HCT: 27.5 % — ABNORMAL LOW (ref 36.0–46.0)
Hemoglobin: 8.7 g/dL — ABNORMAL LOW (ref 12.0–15.0)
MCH: 24.1 pg — ABNORMAL LOW (ref 26.0–34.0)
MCHC: 31.6 g/dL (ref 30.0–36.0)
MCV: 76.2 fL — ABNORMAL LOW (ref 80.0–100.0)
Platelets: 351 10*3/uL (ref 150–400)
RBC: 3.61 MIL/uL — ABNORMAL LOW (ref 3.87–5.11)
RDW: 17 % — ABNORMAL HIGH (ref 11.5–15.5)
WBC: 11.6 10*3/uL — ABNORMAL HIGH (ref 4.0–10.5)
nRBC: 0 % (ref 0.0–0.2)

## 2019-07-19 MED ORDER — ENOXAPARIN SODIUM 40 MG/0.4ML ~~LOC~~ SOLN
40.0000 mg | SUBCUTANEOUS | Status: DC
  Administered 2019-07-19 – 2019-07-22 (×4): 40 mg via SUBCUTANEOUS
  Filled 2019-07-19 (×3): qty 0.4

## 2019-07-19 MED ORDER — INSULIN ASPART 100 UNIT/ML ~~LOC~~ SOLN
0.0000 [IU] | Freq: Three times a day (TID) | SUBCUTANEOUS | Status: DC
  Administered 2019-07-19 – 2019-07-20 (×4): 3 [IU] via SUBCUTANEOUS
  Administered 2019-07-20: 7 [IU] via SUBCUTANEOUS
  Administered 2019-07-21 – 2019-07-22 (×4): 3 [IU] via SUBCUTANEOUS
  Administered 2019-07-22: 4 [IU] via SUBCUTANEOUS

## 2019-07-19 NOTE — Progress Notes (Signed)
Physical Therapy Treatment Patient Details Name: Kathryn Coffey MRN: [id number] DOB: Sep 13, 1970 Today's Date: 07/19/2019    History of Present Illness pt is a 48 y/o female admitted with left hemiplegia with right basal ganglia infarct. PMHx: DM, HTN and obesity    PT Comments    Mobility of L U and LE's noticeably improved.  Pt a little lethargic from pain medicine and a little impulsive with mobility.  Emphasis on transitions to/from EOB, sit to stand, pre-gait, transfer and short distance gait.    Follow Up Recommendations  CIR;Supervision/Assistance - 24 hour     Equipment Recommendations  (TBA)    Recommendations for Other Services Rehab consult     Precautions / Restrictions Precautions Precautions: Fall Precaution Comments: left hemiparesis    Mobility  Bed Mobility Overal bed mobility: Needs Assistance Bed Mobility: Rolling;Sidelying to Sit;Sit to Supine Rolling: Mod assist;+2 for physical assistance;+2 for safety/equipment Sidelying to sit: Mod assist;+2 for physical assistance;+2 for safety/equipment   Sit to supine: Max assist;+2 for physical assistance   General bed mobility comments: cues for technique and sequencing to get normalized movement.  Both rolling assist and stability and assist of trunk to come up via R UE  Transfers Overall transfer level: Needs assistance Equipment used: 2 person hand held assist Transfers: Sit to/from [facility] Sit to Stand: Mod assist;+2 physical assistance;+2 safety/equipment Stand pivot transfers: Mod assist;+2 physical assistance;+2 safety/equipment       General transfer comment: cues for hand placement.  Assist to come forward and boost.  Assist to help control w/shift, advance L LE and support left knee.  Ambulation/Gait Ambulation/Gait assistance: Mod assist;+2 physical assistance Gait Distance (Feet): 2 Feet Assistive device: 2 person hand held assist Gait Pattern/deviations: Step-to  pattern     General Gait Details: pt moving impulsively, not waiting on instruction and assist to normalize gait pattern   Stairs             Wheelchair Mobility    Modified Rankin (Stroke Patients Only) Modified Rankin (Stroke Patients Only) Pre-Morbid Rankin Score: No symptoms Modified Rankin: Severe disability     Balance Overall balance assessment: Needs assistance Sitting-balance support: Feet supported;Single extremity supported;No upper extremity supported Sitting balance-Leahy Scale: Poor Sitting balance - Comments: needing her R UE assist or external assist, but improving Postural control: Posterior lean Standing balance support: Bilateral upper extremity supported Standing balance-Leahy Scale: Poor Standing balance comment: +2 for control of L Knee and stability for w/shift assist                            Cognition Arousal/Alertness: Awake/alert Behavior During Therapy: WFL for tasks assessed/performed;Flat affect Overall Cognitive Status: Impaired/Different from baseline Area of Impairment: Safety/judgement;Awareness;Following commands                       Following Commands: Follows one step commands with increased time Safety/Judgement: Decreased awareness of deficits;Decreased awareness of safety Awareness: Emergent   General Comments: Pt answering all questions, but would take increaed time to answer and when in recliner would fling her head back and sigh with no reason stated      Exercises Other Exercises Other Exercises: resisted/aa ROM ex bil x10 reps in hip/knee flex/ext.    General Comments General comments (skin integrity, edema, etc.): vss      Pertinent Vitals/Pain Pain Assessment: Faces Faces Pain Scale: Hurts even more Pain Location: headache Pain Descriptors /  Indicators: Aching Pain Intervention(s): Monitored during session    Home Living                      Prior Function            PT  Goals (current goals can now be found in the care plan section) Acute Rehab PT Goals Patient Stated Goal: back to independence PT Goal Formulation: With patient/family Time For Goal Achievement: 07/30/19 Potential to Achieve Goals: Fair Progress towards PT goals: Progressing toward goals    Frequency    Min 4X/week      PT Plan Current plan remains appropriate    Co-evaluation              AM-PAC PT "6 Clicks" Mobility   Outcome Measure  Help needed turning from your back to your side while in a flat bed without using bedrails?: A Lot Help needed moving from lying on your back to sitting on the side of a flat bed without using bedrails?: A Lot Help needed moving to and from a bed to a chair (including a wheelchair)?: A Lot Help needed standing up from a chair using your arms (e.g., wheelchair or bedside chair)?: A Lot Help needed to walk in hospital room?: Total Help needed climbing 3-5 steps with a railing? : Total 6 Click Score: 10    End of Session   Activity Tolerance: Patient tolerated treatment well Patient left: in chair;with call bell/phone within reach;with chair alarm set   PT Visit Diagnosis: Muscle weakness (generalized) (M62.81);Hemiplegia and hemiparesis;Other abnormalities of gait and mobility (R26.89) Hemiplegia - Right/Left: Left Hemiplegia - dominant/non-dominant: Non-dominant Hemiplegia - caused by: Cerebral infarction     Time: 1353-1419 PT Time Calculation (min) (ACUTE ONLY): 26 min  Charges:  $Therapeutic Activity: 8-22 mins $Neuromuscular Re-education: 8-22 mins                     07/19/2019  Donnella Sham, PT Acute Rehabilitation Services [phone]  (pager) [phone]  (office)   Tessie Fass Zaara Sprowl 07/19/2019, 2:49 PM

## 2019-07-19 NOTE — Progress Notes (Signed)
STROKE TEAM PROGRESS NOTE   INTERVAL HISTORY Patient husband at bedside. Pt lying in bed, AAO x 3, still has left sided weakness but left LE strength improved. Husband has a lot of question about the reocclusion of right ICA after stenting. I answered all his questions to his satisfaction.   Vitals:   07/19/19 0100 07/19/19 0200 07/19/19 0300 07/19/19 0400  BP: 116/79 110/77 (!) 140/92   Pulse: 66 65 61   Resp: 14 14 16    Temp:    98.3 F (36.8 C)  TempSrc:    Oral  SpO2: 95% 95% 97%   Weight:      Height:        CBC:  Recent Labs  Lab 07/15/19 1225  07/16/19 0450 07/18/19 0559 07/19/19 0221  WBC 10.7*  --  15.0* 10.3 11.6*  NEUTROABS 9.6*  --  12.9*  --   --   HGB 11.9*   < > 9.3* 8.8* 8.7*  HCT 39.9   < > 30.8* 28.4* 27.5*  MCV 78.1*  --  78.6* 76.3* 76.2*  PLT 315  --  399 337 351   < > = values in this interval not displayed.    Basic Metabolic Panel:  Recent Labs  Lab 07/16/19 0450 07/18/19 0559  NA 135 140  K 3.5 3.6  CL 102 106  CO2 22 26  GLUCOSE 184* 151*  BUN 17 13  CREATININE 0.82 0.68  CALCIUM 8.3* 8.5*   Lipid Panel:     Component Value Date/Time   CHOL 129 07/16/2019 0450   TRIG 309 (H) 07/16/2019 0450   TRIG 321 (H) 07/16/2019 0450   HDL 26 (L) 07/16/2019 0450   CHOLHDL 5.0 07/16/2019 0450   VLDL 62 (H) 07/16/2019 0450   LDLCALC 41 07/16/2019 0450   HgbA1c:  Lab Results  Component Value Date   HGBA1C 8.0 (H) 07/16/2019   Urine Drug Screen: No results found for: LABOPIA, COCAINSCRNUR, LABBENZ, AMPHETMU, THCU, LABBARB  Alcohol Level No results found for: [facility]  IMAGING  MRI head 07/17/2019 Acute infarct in the right basal ganglia with hemorrhagic transformation in the right caudate and right lenticular nucleus. Acute infarct also extends into the insula and right temporal lobe. Small acute infarct in the right frontal lobe. Occluded right internal carotid artery.   MRI Head WO Contrast  07/18/19 IMPRESSION: 1. Unchanged extent of  patchy right MCA distribution infarct with petechial hemorrhage in the basal ganglia. 2. Continued occlusion of the right ICA at the skull base and cavernous segment. 3. Motion degraded and truncated study due to nausea.   Ct Head Wo Contrast 07/16/2019 Evolving recent infarction involving the right basal ganglia and adjacent white matter, posterior insula, and superior right temporal lobe. No acute intracranial hemorrhage or significant mass effect.   Ct Angio Head W Or Wo Contrast Ct Angio Neck W Or Wo Contrast Ct Cerebral Perfusion W Contrast 07/16/2019 1. CT perfusion negative for acute infarct or ischemia. Interval normalization of infarct in the right basal ganglia with ischemia in the right MCA territory on CT perfusion yesterday. 2. Reocclusion of right internal carotid artery near the origin. Right internal carotid artery stent is occluded. Right MCA now widely patent. 3. Left carotid and both vertebral arteries widely patent.   Ct Angio Head W Or Wo Contrast Ct Angio Neck W Or Wo Contrast Ct Cerebral Perfusion W Contrast 07/15/2019 1. Occluded right internal carotid artery without reconstitution at the neck or skull base. 2. There is  some flow into the proximal right M1 segment via a patent anterior communicating artery and right A1 segment. 3. There is some collateral position of right MCA branch vessels. 4. CT perfusion study demonstrates in ischemic penumbra involving most of the right MCA territory with a large mismatch volume of 142 mL. 5. Core infarct corresponds to the noncontrast CT of the head with involvement of the right lentiform nucleus. 6. No acute hemorrhage. 7. Diffuse distal small vessel disease. 8. Tortuosity of the cervical internal carotid arteries bilaterally. This is nonspecific, but often seen in the setting of longstanding hypertension.   Ct Head Wo Contrast 07/15/2019 No acute hemorrhage.   Ir Percutaneous Art Thrombectomy/infusion Intracranial Inc Diag  Angio 07/15/2019 1. Occluded right internal carotid artery without reconstitution at the neck or skull base. 2. There is some flow into the proximal right M1 segment via a patent anterior communicating artery and right A1 segment. 3. There is some collateral position of right MCA branch vessels. 4. CT perfusion study demonstrates in ischemic penumbra involving most of the right MCA territory with a large mismatch volume of 142 mL. 5. Core infarct corresponds to the noncontrast CT of the head with involvement of the right lentiform nucleus. 6. No acute hemorrhage. 7. Diffuse distal small vessel disease. 8. Tortuosity of the cervical internal carotid arteries bilaterally. This is nonspecific, but often seen in the setting of longstanding hypertension.   Ct Head Code Stroke Wo Contrast 07/15/2019 1. Acute non hemorrhagic infarct involving the right basal ganglia with hypoattenuation of the right lentiform nucleus. 2. Hyperdense right MCA suggesting thrombus. 3. ASPECTS is 9/10   Cerebral Angiogram 07/15/2019 S/P revascularization of Rt MCA M 1 with x 1 pass with the 55mm x 60mm embotrap retriver device and  Penumbra aspiration achieving a TICI 3 revascularization.  Repair of RT ICA prox  occlusive dissection with  Flow diverter device.  2D Echocardiogram 1. Left ventricular ejection fraction, by visual estimation, is 60 to 65%. The left ventricle has normal function. There is no left ventricular hypertrophy.  2. Left ventricular diastolic parameters are consistent with Grade III diastolic dysfunction (restrictive).  3. The left ventricle has no regional wall motion abnormalities.  4. Global right ventricle has normal systolic function.The right ventricular size is normal. No increase in right ventricular wall thickness.  5. Left atrial size was normal.  6. Right atrial size was normal.  7. The mitral valve is normal in structure. No evidence of mitral valve regurgitation. No evidence of mitral  stenosis.  8. The tricuspid valve is normal in structure. Tricuspid valve regurgitation is not demonstrated.  9. The aortic valve is normal in structure. Aortic valve regurgitation is not visualized. No evidence of aortic valve sclerosis or stenosis. 10. The pulmonic valve was grossly normal. Pulmonic valve regurgitation is not visualized. 11. TR signal is inadequate for assessing pulmonary artery systolic pressure. 12. The inferior vena cava is normal in size with greater than 50% respiratory variability, suggesting right atrial pressure of 3 mmHg.  Portable Chest Xray 07/16/2019 1.  Endotracheal tube and NG tube in stable position. 2. Low lung volumes with mild bibasilar atelectasis again noted without interim change.  07/15/2019 Tube positions as described without pneumothorax. Mild bibasilar atelectasis. No edema or consolidation. Heart upper normal in size.    PHYSICAL EXAM    Temp:  [97.7 F (36.5 C)-99.3 F (37.4 C)] 99 F (37.2 C) (11/28 1200) Pulse Rate:  [57-106] 64 (11/28 1500) Resp:  [13-25] 15 (11/28 1500)  BP: (110-146)/(59-104) 125/86 (11/28 1500) SpO2:  [92 %-100 %] 96 % (11/28 1500)  General - obese, well developed, in no apparent distress.  Ophthalmologic - fundi not visualized due to noncooperation.  Cardiovascular - Regular rhythm and rate.  Mental Status -  Level of arousal and orientation to time, place, and person were intact. Language including expression, naming, repetition, comprehension was assessed and found intact. Fund of Knowledge was assessed and was intact.  Cranial Nerves II - XII - II - Visual field intact OU. III, IV, VI - Extraocular movements intact. V - Facial sensation intact bilaterally. VII - mild left facial droop. VIII - Hearing & vestibular intact bilaterally. X - Palate elevates symmetrically. XI - Chin turning & shoulder shrug intact bilaterally. XII - Tongue protrusion intact.  Motor Strength - The patient's strength was  normal in right upper and lower extremities, however, left UE proximal 0/5, bicep 2/5, tricep and finger movement 0/5. LLE 3/5 proximal and distal.  Bulk was normal and fasciculations were absent.   Motor Tone - Muscle tone was assessed at the neck and appendages and was normal.  Reflexes - The patient's reflexes were symmetrical in all extremities and she had no pathological reflexes.  Sensory - Light touch, temperature/pinprick were assessed and were symmetrical.    Coordination - The patient had normal movements in the right hands with no ataxia or dysmetria.  Tremor was absent.  Gait and Station - deferred.   ASSESSMENT/PLAN Ms. ALEKSI COVERSTONE is a 48 y.o. female with history of morbid obesity, DB, HTN, migraine, asthma, GERD who fell at home and refused to go to the hospital. Later she c/o of HA and noted to be flaccid on her L side. EMS was called. Enrolled in the [employer]. Received tenecteplase at 1334 on 07/15/2019.  Stroke:   R MCA infarct w/ HT due to right ICA and MCA occlusion s/p tenecteplase ([employer]) and IR w/ TICI3 revascularization and R ICA stent, now stent re-occluded - embolic likely due to ICA dissection  Code Stroke CT head R basal ganglia infarct w/ hypoattenuation R lentiform nucleus. Hyperdense R MCA. ASPECTS 9    CTA head & neck R ICA and MCA occlusion.   CT perfusion positive for penumbra  Cerebra angio occluded R MCA M1 w/ TICI3 revascularization. Repair of R ICA proximal occlusive dissection w/ flow diverter  Repeat CTA head & neck reocclusion R ICA near origin. R ICA stent is occluded. R MCA patent. L ICA and B VA open.   MRI - 11/26 - R basal ganglia infarct w/ hemorrhagic transformation into R caudate and R lentiform nucleus. Infarct into insula and R temporal lobe. Small R frontal lobe infarct.  2D Echo - EF 60-65%. No source of embolus   Consider 30 day cardiac event monitoring as outpt  LDL 41  HgbA1c 8.0  lovenox for VTE  prophylaxis  No antithrombotic prior to admission, now on aspirin 81 mg daily and Brilinta (ticagrelor) 90 mg bid given stent placement.  Therapy recommendations:  CIR   Disposition:  pending   Carotid dissection / occlusion, right   S/p stent, reoccluded post placement  Avoid low BP  long term BP goal 130-150   Hypertension  Home meds:  Losartan-HCTZ 100-25, triamterene-HCTZ 34.5-25  Stable  No BP meds now . 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 On valacyclovir 1000 tid  Acute blood loss anemia 11.9->10.5->9.3->8.8->8.7 (monitor daily)  Mild Leukocytosis - 10.7->15.0->10.3->11.6 (afebrile)  Hospital day # 4  This patient is critically ill due to right ICA dissection, right MCA stroke, stent reocclusion, uncontrolled diabetes and at significant risk of neurological worsening, death form recurrent stroke, hemorrhagic conversion, cerebral edema, brain herniation, seizure. This patient's care requires constant monitoring of vital signs, hemodynamics, respiratory and cardiac monitoring, review of multiple databases, neurological assessment, discussion with family, other specialists and medical decision making of high complexity. I spent 35 minutes of neurocritical care time in the care of this patient. I had long discussion with patient and her husband at bedside, updated pt current condition, treatment plan and potential prognosis, and answered all the questions.  They expressed understanding and appreciation.   Rosalin Hawking, MD PhD Stroke Neurology 07/19/2019 4:18 PM    To contact  Stroke Continuity provider, please refer to [email or web address] After hours, contact General Neurology

## 2019-07-20 DIAGNOSIS — E1159 Type 2 diabetes mellitus with other circulatory complications: Secondary | ICD-10-CM

## 2019-07-20 LAB — BASIC METABOLIC PANEL
Anion gap: 10 (ref 5–15)
BUN: 15 mg/dL (ref 6–20)
CO2: 26 mmol/L (ref 22–32)
Calcium: 8.7 mg/dL — ABNORMAL LOW (ref 8.9–10.3)
Chloride: 102 mmol/L (ref 98–111)
Creatinine, Ser: 0.71 mg/dL (ref 0.44–1.00)
GFR calc Af Amer: >60 mL/min (ref >60)
GFR calc non Af Amer: >60 mL/min (ref >60)
Glucose, Bld: 149 mg/dL — ABNORMAL HIGH (ref 70–99)
Potassium: 3.8 mmol/L (ref 3.5–5.1)
Sodium: 138 mmol/L (ref 135–145)

## 2019-07-20 LAB — GLUCOSE, CAPILLARY
Glucose-Capillary: 121 mg/dL — ABNORMAL HIGH (ref 70–99)
Glucose-Capillary: 143 mg/dL — ABNORMAL HIGH (ref 70–99)
Glucose-Capillary: 206 mg/dL — ABNORMAL HIGH (ref 70–99)
Glucose-Capillary: 137 mg/dL — ABNORMAL HIGH (ref 70–99)

## 2019-07-20 LAB — CBC
HCT: 29.3 % — ABNORMAL LOW (ref 36.0–46.0)
Hemoglobin: 8.9 g/dL — ABNORMAL LOW (ref 12.0–15.0)
MCH: 23.8 pg — ABNORMAL LOW (ref 26.0–34.0)
MCHC: 30.4 g/dL (ref 30.0–36.0)
MCV: 78.3 fL — ABNORMAL LOW (ref 80.0–100.0)
Platelets: 398 10*3/uL (ref 150–400)
RBC: 3.74 MIL/uL — ABNORMAL LOW (ref 3.87–5.11)
RDW: 17.4 % — ABNORMAL HIGH (ref 11.5–15.5)
WBC: 10.8 10*3/uL — ABNORMAL HIGH (ref 4.0–10.5)
nRBC: 0 % (ref 0.0–0.2)

## 2019-07-20 NOTE — Progress Notes (Signed)
STROKE TEAM PROGRESS NOTE   INTERVAL HISTORY Patient husband and RN are at bedside. Pt initially sleeping but awake during exam. She is on allergy shot for the last 1-2 years, name unknown. Husband is going to find out. Still has left plegic on the UE and 3/5 on the LLE. Pending CIR. Still has mild HA but not major.    Vitals:   07/20/19 0022 07/20/19 0416 07/20/19 0738 07/20/19 1200  BP: (!) 130/92 113/82 (!) 123/97 123/86  Pulse: 66 66 77 67  Resp: 16 18 17 16   Temp: 98.4 F (36.9 C) 98 F (36.7 C) (!) 97.4 F (36.3 C) 98.8 F (37.1 C)  TempSrc: Oral Oral Oral Oral  SpO2: 96% 97% 100% 100%  Weight:      Height:        CBC:  Recent Labs  Lab 07/15/19 1225  07/16/19 0450  07/19/19 0221 07/20/19 0708  WBC 10.7*  --  15.0*   < > 11.6* 10.8*  NEUTROABS 9.6*  --  12.9*  --   --   --   HGB 11.9*   < > 9.3*   < > 8.7* 8.9*  HCT 39.9   < > 30.8*   < > 27.5* 29.3*  MCV 78.1*  --  78.6*   < > 76.2* 78.3*  PLT 315  --  399   < > 351 398   < > = values in this interval not displayed.    Basic Metabolic Panel:  Recent Labs  Lab 07/18/19 0559 07/20/19 0708  NA 140 138  K 3.6 3.8  CL 106 102  CO2 26 26  GLUCOSE 151* 149*  BUN 13 15  CREATININE 0.68 0.71  CALCIUM 8.5* 8.7*   Lipid Panel:     Component Value Date/Time   CHOL 129 07/16/2019 0450   TRIG 309 (H) 07/16/2019 0450   TRIG 321 (H) 07/16/2019 0450   HDL 26 (L) 07/16/2019 0450   CHOLHDL 5.0 07/16/2019 0450   VLDL 62 (H) 07/16/2019 0450   LDLCALC 41 07/16/2019 0450   HgbA1c:  Lab Results  Component Value Date   HGBA1C 8.0 (H) 07/16/2019   Urine Drug Screen: No results found for: LABOPIA, COCAINSCRNUR, LABBENZ, AMPHETMU, THCU, LABBARB  Alcohol Level No results found for: [facility]  IMAGING  MRI head 07/17/2019 Acute infarct in the right basal ganglia with hemorrhagic transformation in the right caudate and right lenticular nucleus. Acute infarct also extends into the insula and right temporal lobe. Small acute  infarct in the right frontal lobe. Occluded right internal carotid artery.   MRI Head WO Contrast  07/18/19 IMPRESSION: 1. Unchanged extent of patchy right MCA distribution infarct with petechial hemorrhage in the basal ganglia. 2. Continued occlusion of the right ICA at the skull base and cavernous segment. 3. Motion degraded and truncated study due to nausea.   Ct Head Wo Contrast 07/16/2019 Evolving recent infarction involving the right basal ganglia and adjacent white matter, posterior insula, and superior right temporal lobe. No acute intracranial hemorrhage or significant mass effect.   Ct Angio Head W Or Wo Contrast Ct Angio Neck W Or Wo Contrast Ct Cerebral Perfusion W Contrast 07/16/2019 1. CT perfusion negative for acute infarct or ischemia. Interval normalization of infarct in the right basal ganglia with ischemia in the right MCA territory on CT perfusion yesterday. 2. Reocclusion of right internal carotid artery near the origin. Right internal carotid artery stent is occluded. Right MCA now widely patent. 3. Left  carotid and both vertebral arteries widely patent.   Ct Angio Head W Or Wo Contrast Ct Angio Neck W Or Wo Contrast Ct Cerebral Perfusion W Contrast 07/15/2019 1. Occluded right internal carotid artery without reconstitution at the neck or skull base. 2. There is some flow into the proximal right M1 segment via a patent anterior communicating artery and right A1 segment. 3. There is some collateral position of right MCA branch vessels. 4. CT perfusion study demonstrates in ischemic penumbra involving most of the right MCA territory with a large mismatch volume of 142 mL. 5. Core infarct corresponds to the noncontrast CT of the head with involvement of the right lentiform nucleus. 6. No acute hemorrhage. 7. Diffuse distal small vessel disease. 8. Tortuosity of the cervical internal carotid arteries bilaterally. This is nonspecific, but often seen in the setting of  longstanding hypertension.   Ct Head Wo Contrast 07/15/2019 No acute hemorrhage.   Ir Percutaneous Art Thrombectomy/infusion Intracranial Inc Diag Angio 07/15/2019 1. Occluded right internal carotid artery without reconstitution at the neck or skull base. 2. There is some flow into the proximal right M1 segment via a patent anterior communicating artery and right A1 segment. 3. There is some collateral position of right MCA branch vessels. 4. CT perfusion study demonstrates in ischemic penumbra involving most of the right MCA territory with a large mismatch volume of 142 mL. 5. Core infarct corresponds to the noncontrast CT of the head with involvement of the right lentiform nucleus. 6. No acute hemorrhage. 7. Diffuse distal small vessel disease. 8. Tortuosity of the cervical internal carotid arteries bilaterally. This is nonspecific, but often seen in the setting of longstanding hypertension.   Ct Head Code Stroke Wo Contrast 07/15/2019 1. Acute non hemorrhagic infarct involving the right basal ganglia with hypoattenuation of the right lentiform nucleus. 2. Hyperdense right MCA suggesting thrombus. 3. ASPECTS is 9/10   Cerebral Angiogram 07/15/2019 S/P revascularization of Rt MCA M 1 with x 1 pass with the 36mm x 37mm embotrap retriver device and  Penumbra aspiration achieving a TICI 3 revascularization.  Repair of RT ICA prox  occlusive dissection with  Flow diverter device.  2D Echocardiogram 1. Left ventricular ejection fraction, by visual estimation, is 60 to 65%. The left ventricle has normal function. There is no left ventricular hypertrophy.  2. Left ventricular diastolic parameters are consistent with Grade III diastolic dysfunction (restrictive).  3. The left ventricle has no regional wall motion abnormalities.  4. Global right ventricle has normal systolic function.The right ventricular size is normal. No increase in right ventricular wall thickness.  5. Left atrial size was  normal.  6. Right atrial size was normal.  7. The mitral valve is normal in structure. No evidence of mitral valve regurgitation. No evidence of mitral stenosis.  8. The tricuspid valve is normal in structure. Tricuspid valve regurgitation is not demonstrated.  9. The aortic valve is normal in structure. Aortic valve regurgitation is not visualized. No evidence of aortic valve sclerosis or stenosis. 10. The pulmonic valve was grossly normal. Pulmonic valve regurgitation is not visualized. 11. TR signal is inadequate for assessing pulmonary artery systolic pressure. 12. The inferior vena cava is normal in size with greater than 50% respiratory variability, suggesting right atrial pressure of 3 mmHg.  Portable Chest Xray 07/16/2019 1.  Endotracheal tube and NG tube in stable position. 2. Low lung volumes with mild bibasilar atelectasis again noted without interim change.  07/15/2019 Tube positions as described without pneumothorax. Mild bibasilar  atelectasis. No edema or consolidation. Heart upper normal in size.    PHYSICAL EXAM    Temp:  [97.4 F (36.3 C)-98.8 F (37.1 C)] 98.8 F (37.1 C) (11/29 1200) Pulse Rate:  [63-77] 67 (11/29 1200) Resp:  [15-18] 16 (11/29 1200) BP: (113-130)/(82-97) 123/86 (11/29 1200) SpO2:  [95 %-100 %] 100 % (11/29 1200)  General - obese, well developed, in no apparent distress.  Ophthalmologic - fundi not visualized due to noncooperation.  Cardiovascular - Regular rhythm and rate.  Mental Status -  Level of arousal and orientation to time, place, and person were intact. Language including expression, naming, repetition, comprehension was assessed and found intact. Fund of Knowledge was assessed and was intact.  Cranial Nerves II - XII - II - Visual field intact OU. III, IV, VI - Extraocular movements intact. V - Facial sensation intact bilaterally. VII - left facial droop. VIII - Hearing & vestibular intact bilaterally. X - Palate elevates  symmetrically. XI - Chin turning & shoulder shrug intact bilaterally. XII - Tongue protrusion intact.  Motor Strength - The patient's strength was normal in right upper and lower extremities, however, left UE proximal 1/5, bicep 1/5, tricep 0/5 and finger movement 1/5. LLE 3/5 proximal and distal.  Bulk was normal and fasciculations were absent.   Motor Tone - Muscle tone was assessed at the neck and appendages and was normal.  Reflexes - The patient's reflexes were symmetrical in all extremities and she had no pathological reflexes.  Sensory - Light touch, temperature/pinprick were assessed and were symmetrical.    Coordination - The patient had normal movements in the right hands with no ataxia or dysmetria.  Tremor was absent.  Gait and Station - deferred.  NIH Stroke Scale  Level Of Consciousness 0=Alert; keenly responsive 1=Arouse to minor stimulation 2=Requires repeated stimulation to arouse or movements to pain 3=postures or unresponsive 0  LOC Questions to Month and Age 24=Answers both questions correctly 1=Answers one question correctly or dysarthria/intubated/trauma/language barrier 2=Answers neither question correctly or aphasia 0  LOC Commands      -Open/Close eyes     -Open/close grip     -Pantomime commands if communication barrier 0=Performs both tasks correctly 1=Performs one task correctly 2=Performs neighter task correctly 0  Best Gaze     -Only assess horizontal gaze 0=Normal 1=Partial gaze palsy 2=Forced deviation, or total gaze paresis 0  Visual 0=No visual loss 1=Partial hemianopia 2=Complete hemianopia 3=Bilateral hemianopia (blind including cortical blindness) 0  Facial Palsy     -Use grimace if obtunded 0=Normal symmetrical movement 1=Minor paralysis (asymmetry) 2=Partial paralysis (lower face) 3=Complete paralysis (upper and lower face) 2  Motor  0=No drift for 10/5 seconds 1=Drift, but does not hit bed 2=Some antigravity effort, hits  bed 3=No  effort against gravity, limb falls 4=No movement 0=Amputation/joint fusion Right Arm 0     Leg 0    Left Arm 3     Leg 2  Limb Ataxia     - FNT/HTS 0=Absent or does not understand or paralyzed or amputation/joint fusion 1=Present in one limb 2=Present in two limbs 0  Sensory 0=Normal 1=Mild to moderate sensory loss 2=Severe to total sensory loss or coma/unresponsive 0  Best Language 0=No aphasia, normal 1=Mild to moderate aphasia 2=Severe aphasia 3=Mute, global aphasia, or coma/unresponsive 0  Dysarthria 0=Normal 1=Mild to moderate 2=Severe, unintelligible or mute/anarthric 0=intubated/unable to test 0  Extinction/Neglect 0=No abnormality 1=visual/tactile/auditory/spatia/personal inattention/Extinction to bilateral simultaneous stimulation 2=Profound neglect/extinction more than 1 modality  0  Total  7   mRS at this time is 4     ASSESSMENT/PLAN [name] is a 48 y.o. female with history of morbid obesity, DB, HTN, migraine, asthma, GERD who fell at home and refused to go to the hospital. Later she c/o of HA and noted to be flaccid on her L side. EMS was called. Enrolled in the [employer]. Received tenecteplase at 1334 on 07/15/2019.  Stroke:   R MCA infarct w/ HT due to right ICA and MCA occlusion s/p tenecteplase ([facility]) and IR w/ TICI3 revascularization and R ICA stent, now stent re-occluded - embolic likely due to ICA dissection  Code Stroke CT head R basal ganglia infarct w/ hypoattenuation R lentiform nucleus. Hyperdense R MCA. ASPECTS 9    CTA head & neck R ICA and MCA occlusion.   CT perfusion positive for penumbra  Cerebra angio occluded R MCA M1 w/ TICI3 revascularization. Repair of R ICA proximal occlusive dissection w/ flow diverter  Repeat CTA head & neck reocclusion R ICA near origin. R ICA stent is occluded. R MCA patent. L ICA and B VA open.   MRI - 11/26 - R basal ganglia infarct w/ hemorrhagic transformation into R caudate and R  lentiform nucleus. Infarct into insula and R temporal lobe. Small R frontal lobe infarct.  2D Echo - EF 60-65%. No source of embolus   Consider 30 day cardiac event monitoring as outpt  LDL 41  HgbA1c 8.0  lovenox for VTE prophylaxis  No antithrombotic prior to admission, now on aspirin 81 mg daily and Brilinta (ticagrelor) 90 mg bid given stent placement.  Therapy recommendations:  CIR   Disposition:  pending   Carotid dissection / occlusion, right   S/p stent, reoccluded post placement  Avoid low BP  long term BP goal 130-150   Hypertension  Home meds:  Losartan-HCTZ 100-25, triamterene-HCTZ 34.5-25  Stable  No BP meds now . 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 On valacyclovir 1000 tid  Acute blood loss anemia 11.9->10.5->9.3->8.8->8.7 (monitor daily)  Mild Leukocytosis - 10.7->15.0->10.3->11.6 (afebrile)  Taking allergic injection meds for 1-2 years - name unknown  Hospital day # 5  I had long discussion with pt and husband at bedside, updated pt current condition, treatment plan and potential prognosis, and answered all the questions. They expressed understanding and appreciation.    Rosalin Hawking, MD PhD Stroke Neurology 07/20/2019 2:05 PM    To contact Stroke Continuity provider, please refer to [email or web address] After hours, contact General Neurology

## 2019-07-21 DIAGNOSIS — D72829 Elevated white blood cell count, unspecified: Secondary | ICD-10-CM

## 2019-07-21 DIAGNOSIS — E1165 Type 2 diabetes mellitus with hyperglycemia: Secondary | ICD-10-CM | POA: Diagnosis present

## 2019-07-21 DIAGNOSIS — D62 Acute posthemorrhagic anemia: Secondary | ICD-10-CM | POA: Diagnosis not present

## 2019-07-21 DIAGNOSIS — I7771 Dissection of carotid artery: Secondary | ICD-10-CM | POA: Diagnosis present

## 2019-07-21 DIAGNOSIS — IMO0002 Reserved for concepts with insufficient information to code with codable children: Secondary | ICD-10-CM | POA: Diagnosis present

## 2019-07-21 LAB — GLUCOSE, CAPILLARY
Glucose-Capillary: 116 mg/dL — ABNORMAL HIGH (ref 70–99)
Glucose-Capillary: 149 mg/dL — ABNORMAL HIGH (ref 70–99)
Glucose-Capillary: 142 mg/dL — ABNORMAL HIGH (ref 70–99)
Glucose-Capillary: 130 mg/dL — ABNORMAL HIGH (ref 70–99)

## 2019-07-21 LAB — BASIC METABOLIC PANEL
Anion gap: 11 (ref 5–15)
BUN: 14 mg/dL (ref 6–20)
CO2: 25 mmol/L (ref 22–32)
Calcium: 8.6 mg/dL — ABNORMAL LOW (ref 8.9–10.3)
Chloride: 101 mmol/L (ref 98–111)
Creatinine, Ser: 0.6 mg/dL (ref 0.44–1.00)
GFR calc Af Amer: >60 mL/min (ref >60)
GFR calc non Af Amer: >60 mL/min (ref >60)
Glucose, Bld: 153 mg/dL — ABNORMAL HIGH (ref 70–99)
Potassium: 3.6 mmol/L (ref 3.5–5.1)
Sodium: 137 mmol/L (ref 135–145)

## 2019-07-21 LAB — CBC
HCT: 28.8 % — ABNORMAL LOW (ref 36.0–46.0)
Hemoglobin: 8.8 g/dL — ABNORMAL LOW (ref 12.0–15.0)
MCH: 23.7 pg — ABNORMAL LOW (ref 26.0–34.0)
MCHC: 30.6 g/dL (ref 30.0–36.0)
MCV: 77.4 fL — ABNORMAL LOW (ref 80.0–100.0)
Platelets: 427 10*3/uL — ABNORMAL HIGH (ref 150–400)
RBC: 3.72 MIL/uL — ABNORMAL LOW (ref 3.87–5.11)
RDW: 17.3 % — ABNORMAL HIGH (ref 11.5–15.5)
WBC: 10.7 10*3/uL — ABNORMAL HIGH (ref 4.0–10.5)
nRBC: 0 % (ref 0.0–0.2)

## 2019-07-21 MED ORDER — NON FORMULARY: 6.0000 mg | Freq: Every day | Status: DC

## 2019-07-21 MED ORDER — MELATONIN 3 MG PO TABS
6.0000 mg | ORAL_TABLET | Freq: Every day | ORAL | Status: DC
  Administered 2019-07-21: 6 mg via ORAL
  Filled 2019-07-21 (×2): qty 2

## 2019-07-21 NOTE — Progress Notes (Signed)
STROKE TEAM PROGRESS NOTE   INTERVAL HISTORY Husband at bedside. Pt awake alert, left LE strength much improved. Today able to walk with hemiwalker in the hallway with minimal assist. Left UE tricep > bicep now. Pending CIR.     Vitals:   07/20/19 2346 07/21/19 0401 07/21/19 0738 07/21/19 1136  BP: 112/71 (!) 145/90 124/90 121/84  Pulse: 66 67 63 70  Resp: 18 16 17 17   Temp: 98 F (36.7 C) 97.9 F (36.6 C) 98.4 F (36.9 C) 98.2 F (36.8 C)  TempSrc: Oral Oral Oral Oral  SpO2: 100% 100% 97% 96%  Weight:      Height:        CBC:  Recent Labs  Lab 07/15/19 1225  07/16/19 0450  07/20/19 0708 07/21/19 0342  WBC 10.7*  --  15.0*   < > 10.8* 10.7*  NEUTROABS 9.6*  --  12.9*  --   --   --   HGB 11.9*   < > 9.3*   < > 8.9* 8.8*  HCT 39.9   < > 30.8*   < > 29.3* 28.8*  MCV 78.1*  --  78.6*   < > 78.3* 77.4*  PLT 315  --  399   < > 398 427*   < > = values in this interval not displayed.    Basic Metabolic Panel:  Recent Labs  Lab 07/20/19 0708 07/21/19 0342  NA 138 137  K 3.8 3.6  CL 102 101  CO2 26 25  GLUCOSE 149* 153*  BUN 15 14  CREATININE 0.71 0.60  CALCIUM 8.7* 8.6*   Lipid Panel:     Component Value Date/Time   CHOL 129 07/16/2019 0450   TRIG 309 (H) 07/16/2019 0450   TRIG 321 (H) 07/16/2019 0450   HDL 26 (L) 07/16/2019 0450   CHOLHDL 5.0 07/16/2019 0450   VLDL 62 (H) 07/16/2019 0450   LDLCALC 41 07/16/2019 0450   HgbA1c:  Lab Results  Component Value Date   HGBA1C 8.0 (H) 07/16/2019   Urine Drug Screen: No results found for: LABOPIA, COCAINSCRNUR, LABBENZ, AMPHETMU, THCU, LABBARB  Alcohol Level No results found for: [facility]  IMAGING  MRI head 07/17/2019 Acute infarct in the right basal ganglia with hemorrhagic transformation in the right caudate and right lenticular nucleus. Acute infarct also extends into the insula and right temporal lobe. Small acute infarct in the right frontal lobe. Occluded right internal carotid artery.   MRI Head WO  Contrast  07/18/19 IMPRESSION: 1. Unchanged extent of patchy right MCA distribution infarct with petechial hemorrhage in the basal ganglia. 2. Continued occlusion of the right ICA at the skull base and cavernous segment. 3. Motion degraded and truncated study due to nausea.  Ct Head Wo Contrast 07/16/2019 Evolving recent infarction involving the right basal ganglia and adjacent white matter, posterior insula, and superior right temporal lobe. No acute intracranial hemorrhage or significant mass effect.   Ct Angio Head W Or Wo Contrast Ct Angio Neck W Or Wo Contrast Ct Cerebral Perfusion W Contrast 07/16/2019 1. CT perfusion negative for acute infarct or ischemia. Interval normalization of infarct in the right basal ganglia with ischemia in the right MCA territory on CT perfusion yesterday. 2. Reocclusion of right internal carotid artery near the origin. Right internal carotid artery stent is occluded. Right MCA now widely patent. 3. Left carotid and both vertebral arteries widely patent.   Ct Angio Head W Or Wo Contrast Ct Angio Neck W Or Wo Contrast Ct  Cerebral Perfusion W Contrast 07/15/2019 1. Occluded right internal carotid artery without reconstitution at the neck or skull base. 2. There is some flow into the proximal right M1 segment via a patent anterior communicating artery and right A1 segment. 3. There is some collateral position of right MCA branch vessels. 4. CT perfusion study demonstrates in ischemic penumbra involving most of the right MCA territory with a large mismatch volume of 142 mL. 5. Core infarct corresponds to the noncontrast CT of the head with involvement of the right lentiform nucleus. 6. No acute hemorrhage. 7. Diffuse distal small vessel disease. 8. Tortuosity of the cervical internal carotid arteries bilaterally. This is nonspecific, but often seen in the setting of longstanding hypertension.   Ct Head Wo Contrast 07/15/2019 No acute hemorrhage.   Ir  Percutaneous Art Thrombectomy/infusion Intracranial Inc Diag Angio 07/15/2019 1. Occluded right internal carotid artery without reconstitution at the neck or skull base. 2. There is some flow into the proximal right M1 segment via a patent anterior communicating artery and right A1 segment. 3. There is some collateral position of right MCA branch vessels. 4. CT perfusion study demonstrates in ischemic penumbra involving most of the right MCA territory with a large mismatch volume of 142 mL. 5. Core infarct corresponds to the noncontrast CT of the head with involvement of the right lentiform nucleus. 6. No acute hemorrhage. 7. Diffuse distal small vessel disease. 8. Tortuosity of the cervical internal carotid arteries bilaterally. This is nonspecific, but often seen in the setting of longstanding hypertension.   Ct Head Code Stroke Wo Contrast 07/15/2019 1. Acute non hemorrhagic infarct involving the right basal ganglia with hypoattenuation of the right lentiform nucleus. 2. Hyperdense right MCA suggesting thrombus. 3. ASPECTS is 9/10   Cerebral Angiogram 07/15/2019 S/P revascularization of Rt MCA M 1 with x 1 pass with the 81mm x 12mm embotrap retriver device and  Penumbra aspiration achieving a TICI 3 revascularization.  Repair of RT ICA prox  occlusive dissection with  Flow diverter device.  2D Echocardiogram 1. Left ventricular ejection fraction, by visual estimation, is 60 to 65%. The left ventricle has normal function. There is no left ventricular hypertrophy.  2. Left ventricular diastolic parameters are consistent with Grade III diastolic dysfunction (restrictive).  3. The left ventricle has no regional wall motion abnormalities.  4. Global right ventricle has normal systolic function.The right ventricular size is normal. No increase in right ventricular wall thickness.  5. Left atrial size was normal.  6. Right atrial size was normal.  7. The mitral valve is normal in structure. No evidence  of mitral valve regurgitation. No evidence of mitral stenosis.  8. The tricuspid valve is normal in structure. Tricuspid valve regurgitation is not demonstrated.  9. The aortic valve is normal in structure. Aortic valve regurgitation is not visualized. No evidence of aortic valve sclerosis or stenosis. 10. The pulmonic valve was grossly normal. Pulmonic valve regurgitation is not visualized. 11. TR signal is inadequate for assessing pulmonary artery systolic pressure. 12. The inferior vena cava is normal in size with greater than 50% respiratory variability, suggesting right atrial pressure of 3 mmHg.  Portable Chest Xray 07/16/2019 1.  Endotracheal tube and NG tube in stable position. 2. Low lung volumes with mild bibasilar atelectasis again noted without interim change.  07/15/2019 Tube positions as described without pneumothorax. Mild bibasilar atelectasis. No edema or consolidation. Heart upper normal in size.    PHYSICAL EXAM   Temp:  [97.9 F (36.6 C)-98.4 F (  36.9 C)] 98.2 F (36.8 C) (11/30 1136) Pulse Rate:  [63-76] 70 (11/30 1136) Resp:  [16-18] 17 (11/30 1136) BP: (112-145)/(71-90) 121/84 (11/30 1136) SpO2:  [96 %-100 %] 96 % (11/30 1136)  General - obese, well developed, in no apparent distress.  Ophthalmologic - fundi not visualized due to noncooperation.  Cardiovascular - Regular rhythm and rate.  Mental Status -  Level of arousal and orientation to time, place, and person were intact. Language including expression, naming, repetition, comprehension was assessed and found intact. Fund of Knowledge was assessed and was intact.  Cranial Nerves II - XII - II - Visual field intact OU. III, IV, VI - Extraocular movements intact. V - Facial sensation intact bilaterally. VII - left facial droop. VIII - Hearing & vestibular intact bilaterally. X - Palate elevates symmetrically. XI - Chin turning & shoulder shrug intact bilaterally. XII - Tongue protrusion  intact.  Motor Strength - The patient's strength was normal in right upper and lower extremities, however, left UE proximal 1/5, bicep 1/5, tricep 3/5 and finger movement 0/5. LLE 4+/5 proximal and distal.  Bulk was normal and fasciculations were absent.   Motor Tone - Muscle tone was assessed at the neck and appendages and was normal.  Reflexes - The patient's reflexes were symmetrical in all extremities and she had no pathological reflexes.  Sensory - Light touch, temperature/pinprick were assessed and were symmetrical.    Coordination - The patient had normal movements in the right hands with no ataxia or dysmetria.  Tremor was absent.  Gait and Station - deferred.   ASSESSMENT/PLAN [name] is a 48 y.o. female with history of morbid obesity, DB, HTN, migraine, asthma, GERD who fell at home and refused to go to the hospital. Later she c/o of HA and noted to be flaccid on her L side. EMS was called. Enrolled in the [employer]. Received tenecteplase at 1334 on 07/15/2019.  Stroke:   R MCA infarct w/ HT due to right ICA and MCA occlusion s/p tenecteplase ([employer]) and IR w/ TICI3 revascularization and R ICA stent, now stent re-occluded - embolic likely due to ICA dissection  Code Stroke CT head R basal ganglia infarct w/ hypoattenuation R lentiform nucleus. Hyperdense R MCA. ASPECTS 9    CTA head & neck R ICA and MCA occlusion.   CT perfusion positive for penumbra  Cerebra angio occluded R MCA M1 w/ TICI3 revascularization. Repair of R ICA proximal occlusive dissection w/ flow diverter  Repeat CTA head & neck reocclusion R ICA near origin. R ICA stent is occluded. R MCA patent. L ICA and B VA open.   MRI - 11/26 - R basal ganglia infarct w/ hemorrhagic transformation into R caudate and R lentiform nucleus. Infarct into insula and R temporal lobe. Small R frontal lobe infarct.  2D Echo - EF 60-65%. No source of embolus   Consider 30 day cardiac event monitoring as outpt  after d/c from CIR  LDL 41  HgbA1c 8.0  lovenox for VTE prophylaxis  No antithrombotic prior to admission, now on aspirin 81 mg daily and Brilinta (ticagrelor) 90 mg bid given stent placement.  Therapy recommendations:  CIR   Disposition:  CIR once bed available  Carotid dissection / occlusion, right   S/p stent, reoccluded post placement  Avoid low BP  long term BP goal 130-150   Hypertension  Home meds:  Losartan-HCTZ 100-25, triamterene-HCTZ 34.5-25  Stable  No BP meds now . 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 On valacyclovir 1000 tid  Acute blood loss anemia 11.9->10.5->9.3->8.8->8.7->8.9->8.8 (monitor daily)  Mild Leukocytosis - 10.7->15.0->10.3->11.6->10.8->10.7 (afebrile)  Taking allergic injection meds for 1-2 years - name unknown  Hospital day # 6   Rosalin Hawking, MD PhD Stroke Neurology 07/21/2019 3:28 PM    To contact Stroke Continuity provider, please refer to [email or web address] After hours, contact General Neurology

## 2019-07-21 NOTE — Progress Notes (Signed)
Inpatient Rehabilitation-Admissions Coordinator   Initiated insurance authorization process this morning. Will update once there has been a determination regarding CIR.   Jhonnie Garner, OTR/L  Rehab Admissions Coordinator  [phone] 07/21/2019 1:05 PM

## 2019-07-21 NOTE — Discharge Summary (Addendum)
Stroke Discharge Summary  Patient ID: Kathryn Coffey   MRN: [id number]      DOB: [birth year]  Date of Admission: 07/15/2019 Date of Discharge: 07/22/2019  Attending Physician:  Rosalin Hawking, MD, Stroke MD Consultant(s):   Wyman Songster, MD (Interventional Neuroradiologist), pulmonary/intensive care, Alysia Penna, MD (Physical Medicine & Rehabtilitation)  Patient's PCP:  Rusty Aus, MD  Discharge Diagnoses:  Principal Problem:   Stroke (cerebrum) ([place]) - R MCA infarct d/t ICA dissection s/p tenecteplase and mechanical thrombectomy  Active Problems:   Morbid obesity ([place])   HTN (hypertension)   Carotid artery dissection  ([facility]) s/p stent placement - reoccluded    Diabetes mellitus type II, uncontrolled ([place])   Acute blood loss anemia   Leukocytosis  Past Medical History:  Diagnosis Date  . Abdominal wall cellulitis 2013 and 2014  . Asthma    allergic to grasses  . B12 deficiency   . Complication of anesthesia   . Costochondritis   . Diabetes mellitus without complication ([place])   . Gastric anomaly    multiple small ulcers  . GERD (gastroesophageal reflux disease)   . Herpes 06/2018   POSSIBLY IN EYE- PT TAKING VALTREX AND WILL SEE OPTHAMOLOGIST TO SEE IF VALTREX IS WORKING  . History of abnormal cervical Pap smear   . History of Clostridium difficile colitis   . History of hiatal hernia   . History of kidney stones   . Hypertension   . IBS (irritable bowel syndrome)   . Migraine   . Morbid obesity ([place])    s/p attempted gastric banding now decompressed  . PCOS (polycystic ovarian syndrome)   . PONV (postoperative nausea and vomiting)    WITH SPINAL ONLY   Past Surgical History:  Procedure Laterality Date  . BREAST EXCISIONAL BIOPSY Left    cyst excision - Dr. Patty Sermons  . CESAREAN SECTION    . CHOLECYSTECTOMY    . COLONOSCOPY    . EAR CYST EXCISION Right 07/04/2018   Procedure: EXCISION GLOMUS TUMOR THUMBNAIL;  Surgeon: Corky Mull, MD;   Location: [facility] ORS;  Service: Orthopedics;  Laterality: Right;  . ESOPHAGOGASTRODUODENOSCOPY    . ESOPHAGOGASTRODUODENOSCOPY (EGD) WITH PROPOFOL N/A 12/06/2015   Procedure: ESOPHAGOGASTRODUODENOSCOPY (EGD) WITH PROPOFOL;  Surgeon: Manya Silvas, MD;  Location: [facility] ENDOSCOPY;  Service: Endoscopy;  Laterality: N/A;  . IR ANGIO INTRA EXTRACRAN SEL INTERNAL CAROTID UNI L MOD SED  07/15/2019  . IR ANGIO VERTEBRAL SEL SUBCLAVIAN INNOMINATE UNI R MOD SED  07/15/2019  . IR CT HEAD LTD  07/15/2019  . IR INTRAVSC STENT CERV CAROTID W/O EMB-PROT MOD SED INC ANGIO  07/15/2019  . IR PERCUTANEOUS ART THROMBECTOMY/INFUSION INTRACRANIAL INC DIAG ANGIO  07/15/2019  . LAPAROSCOPIC GASTRIC BANDING    . LAPAROSCOPIC GASTRIC RESTRICTIVE DUODENAL PROCEDURE (DUODENAL SWITCH) N/A 01/25/2016   Procedure: LAPAROSCOPIC GASTRIC RESTRICTIVE DUODENAL PROCEDURE (DUODENAL SWITCH);  Surgeon: Ladora Daniel, MD;  Location: [facility] ORS;  Service: General;  Laterality: N/A;  . OVARY SURGERY     x2  . RADIOLOGY WITH ANESTHESIA N/A 07/15/2019   Procedure: IR WITH ANESTHESIA;  Surgeon: Luanne Bras, MD;  Location: [place];  Service: Radiology;  Laterality: N/A;  . TONSILLECTOMY    . TUBAL LIGATION    . UMBILICAL HERNIA REPAIR N/A 01/25/2016   Procedure: LAPAROSCOPIC UMBILICAL HERNIA;  Surgeon: Ladora Daniel, MD;  Location: [facility] ORS;  Service: General;  Laterality: N/A;  . UMBILICAL HERNIA REPAIR N/A 10/20/2016  Procedure: HERNIA REPAIR UMBILICAL ADULT;  Surgeon: Leonie Green, MD;  Location: [facility] ORS;  Service: General;  Laterality: N/A;    Medications to be continued on Rehab Allergies as of 07/22/2019      Reactions   Azithromycin Other (See Comments)   Abdominal wall abcess   Ivp Dye [iodinated Diagnostic Agents] Hives   Lexapro [escitalopram] Other (See Comments)   migraine   Vicodin [hydrocodone-acetaminophen] Other (See Comments)   migraine      Medication List    STOP taking these medications    amphetamine-dextroamphetamine 10 MG tablet Commonly known as: ADDERALL   liraglutide 18 MG/3ML Sopn Commonly known as: VICTOZA   losartan-hydrochlorothiazide 100-25 MG tablet Commonly known as: HYZAAR   PREDNISONE PO   triamterene-hydrochlorothiazide 37.5-25 MG capsule Commonly known as: DYAZIDE     TAKE these medications   albuterol 108 (90 Base) MCG/ACT inhaler Commonly known as: VENTOLIN HFA Inhale 2 puffs into the lungs every 6 (six) hours as needed for wheezing or shortness of breath.   aspirin 81 MG chewable tablet Chew 1 tablet (81 mg total) by mouth daily. Start taking on: July 23, 2019   buPROPion 75 MG tablet Commonly known as: WELLBUTRIN Take 75 mg by mouth 2 (two) times daily.   CALCIUM + VITAMIN D3 PO Take 1 tablet by mouth daily.   enoxaparin 40 MG/0.4ML injection Commonly known as: LOVENOX Inject 0.4 mLs (40 mg total) into the skin daily.   fexofenadine 180 MG tablet Commonly known as: ALLEGRA Take 180 mg by mouth daily as needed for allergies.   fluticasone 50 MCG/ACT nasal spray Commonly known as: FLONASE Place 2 sprays into both nostrils daily as needed for allergies or rhinitis.   insulin aspart 100 UNIT/ML injection Commonly known as: novoLOG Inject 0-20 Units into the skin 4 (four) times daily -  with meals and at bedtime.   insulin detemir 100 UNIT/ML injection Commonly known as: LEVEMIR Inject 0.1 mLs (10 Units total) into the skin at bedtime.   Melatonin 3 MG Tabs Take 2 tablets (6 mg total) by mouth at bedtime.   metFORMIN 500 MG tablet Commonly known as: GLUCOPHAGE Take 500 mg by mouth 2 (two) times daily.   MULTIVITAMIN PO Take 2 tablets by mouth daily.   NON FORMULARY 1 Dose once a week. Wednesdays   omeprazole 20 MG capsule Commonly known as: PRILOSEC Take 20 mg by mouth daily.   oxyCODONE 5 MG immediate release tablet Commonly known as: Oxy IR/ROXICODONE Take 1 tablet (5 mg total) by mouth every 6 (six) hours as  needed for severe pain.   ticagrelor 90 MG Tabs tablet Commonly known as: BRILINTA Take 1 tablet (90 mg total) by mouth 2 (two) times daily.   traMADol 50 MG tablet Commonly known as: ULTRAM Take 1 tablet (50 mg total) by mouth every 6 (six) hours as needed for moderate pain.       LABORATORY STUDIES CBC    Component Value Date/Time   WBC 12.0 (H) 07/22/2019 0408   RBC 3.81 (L) 07/22/2019 0408   HGB 8.9 (L) 07/22/2019 0408   HGB 12.9 09/29/2012 0536   HCT 29.7 (L) 07/22/2019 0408   HCT 38.7 09/29/2012 0536   PLT 465 (H) 07/22/2019 0408   PLT 284 09/29/2012 0536   MCV 78.0 (L) 07/22/2019 0408   MCV 92 09/29/2012 0536   MCH 23.4 (L) 07/22/2019 0408   MCHC 30.0 07/22/2019 0408   RDW 17.7 (H) 07/22/2019 0408   RDW  13.1 09/29/2012 0536   LYMPHSABS 1.2 07/16/2019 0450   LYMPHSABS 2.0 09/29/2012 0536   MONOABS 0.8 07/16/2019 0450   MONOABS 0.5 09/29/2012 0536   EOSABS 0.0 07/16/2019 0450   EOSABS 0.2 09/29/2012 0536   BASOSABS 0.0 07/16/2019 0450   BASOSABS 0.1 09/29/2012 0536   CMP    Component Value Date/Time   NA 139 07/22/2019 0408   NA 140 09/29/2012 0536   K 3.5 07/22/2019 0408   K 3.9 09/29/2012 0536   CL 105 07/22/2019 0408   CL 109 (H) 09/29/2012 0536   CO2 26 07/22/2019 0408   CO2 25 09/29/2012 0536   GLUCOSE 145 (H) 07/22/2019 0408   GLUCOSE 105 (H) 09/29/2012 0536   BUN 15 07/22/2019 0408   BUN 11 09/29/2012 0536   CREATININE 0.65 07/22/2019 0408   CREATININE 0.71 09/29/2012 0536   CALCIUM 8.8 (L) 07/22/2019 0408   CALCIUM 8.4 (L) 09/29/2012 0536   PROT 7.0 07/15/2019 1225   PROT 7.6 04/17/2012 2156   ALBUMIN 3.8 07/15/2019 1225   ALBUMIN 3.5 04/17/2012 2156   AST 58 (H) 07/15/2019 1225   AST 28 04/17/2012 2156   ALT 50 (H) 07/15/2019 1225   ALT 30 04/17/2012 2156   ALKPHOS 115 07/15/2019 1225   ALKPHOS 109 04/17/2012 2156   BILITOT 0.7 07/15/2019 1225   BILITOT 0.3 04/17/2012 2156   GFRNONAA >60 07/22/2019 0408   GFRNONAA >60 09/29/2012  0536   GFRAA >60 07/22/2019 0408   GFRAA >60 09/29/2012 0536   COAGS Lab Results  Component Value Date   INR  07/15/2019    QUESTIONABLE RESULTS, RECOMMEND RECOLLECT TO VERIFY   Lipid Panel    Component Value Date/Time   CHOL 129 07/16/2019 0450   TRIG 309 (H) 07/16/2019 0450   TRIG 321 (H) 07/16/2019 0450   HDL 26 (L) 07/16/2019 0450   CHOLHDL 5.0 07/16/2019 0450   VLDL 62 (H) 07/16/2019 0450   LDLCALC 41 07/16/2019 0450   HgbA1C  Lab Results  Component Value Date   HGBA1C 8.0 (H) 07/16/2019    SIGNIFICANT DIAGNOSTIC STUDIES  MRI Head WO Contrast  07/18/19 1. Unchanged extent of patchy right MCA distribution infarct with petechial hemorrhage in the basal ganglia. 2. Continued occlusion of the right ICA at the skull base and cavernous segment. 3. Motion degraded and truncated study due to nausea.  MRI head 07/17/2019 Acute infarct in the right basal ganglia with hemorrhagic transformation in the right caudate and right lenticular nucleus. Acute infarct also extends into the insula and right temporal lobe. Small acute infarct in the right frontal lobe. Occluded right internal carotid artery.   Ct Head Wo Contrast 07/16/2019 Evolving recent infarction involving the right basal ganglia and adjacent white matter, posterior insula, and superior right temporal lobe. No acute intracranial hemorrhage or significant mass effect.   Ct Angio Head W Or Wo Contrast Ct Angio Neck W Or Wo Contrast Ct Cerebral Perfusion W Contrast 07/16/2019 1. CT perfusion negative for acute infarct or ischemia. Interval normalization of infarct in the right basal ganglia with ischemia in the right MCA territory on CT perfusion yesterday. 2. Reocclusion of right internal carotid artery near the origin. Right internal carotid artery stent is occluded. Right MCA now widely patent. 3. Left carotid and both vertebral arteries widely patent.   Ct Angio Head W Or Wo Contrast Ct Angio Neck W Or Wo  Contrast Ct Cerebral Perfusion W Contrast 07/15/2019 1. Occluded right internal carotid artery without reconstitution  at the neck or skull base. 2. There is some flow into the proximal right M1 segment via a patent anterior communicating artery and right A1 segment. 3. There is some collateral position of right MCA branch vessels. 4. CT perfusion study demonstrates in ischemic penumbra involving most of the right MCA territory with a large mismatch volume of 142 mL. 5. Core infarct corresponds to the noncontrast CT of the head with involvement of the right lentiform nucleus. 6. No acute hemorrhage. 7. Diffuse distal small vessel disease. 8. Tortuosity of the cervical internal carotid arteries bilaterally. This is nonspecific, but often seen in the setting of longstanding hypertension.   Ct Head Wo Contrast 07/15/2019 No acute hemorrhage.   Ir Percutaneous Art Thrombectomy/infusion Intracranial Inc Diag Angio 07/15/2019 1. Occluded right internal carotid artery without reconstitution at the neck or skull base. 2. There is some flow into the proximal right M1 segment via a patent anterior communicating artery and right A1 segment. 3. There is some collateral position of right MCA branch vessels. 4. CT perfusion study demonstrates in ischemic penumbra involving most of the right MCA territory with a large mismatch volume of 142 mL. 5. Core infarct corresponds to the noncontrast CT of the head with involvement of the right lentiform nucleus. 6. No acute hemorrhage. 7. Diffuse distal small vessel disease. 8. Tortuosity of the cervical internal carotid arteries bilaterally. This is nonspecific, but often seen in the setting of longstanding hypertension.   Ct Head Code Stroke Wo Contrast 07/15/2019 1. Acute non hemorrhagic infarct involving the right basal ganglia with hypoattenuation of the right lentiform nucleus. 2. Hyperdense right MCA suggesting thrombus. 3. ASPECTS is 9/10   Cerebral  Angiogram 07/15/2019 S/P revascularization of Rt MCA M 1 with x 1 pass with the 73mm x 79mm embotrap retriver device and Penumbra aspiration achieving a TICI 3 revascularization.  Repair of RT ICA prox occlusive dissection with Flow diverter device.  2D Echocardiogram 1. Left ventricular ejection fraction, by visual estimation, is 60 to 65%. The left ventricle has normal function. There is no left ventricular hypertrophy. 2. Left ventricular diastolic parameters are consistent with Grade III diastolic dysfunction (restrictive). 3. The left ventricle has no regional wall motion abnormalities. 4. Global right ventricle has normal systolic function.The right ventricular size is normal. No increase in right ventricular wall thickness. 5. Left atrial size was normal. 6. Right atrial size was normal. 7. The mitral valve is normal in structure. No evidence of mitral valve regurgitation. No evidence of mitral stenosis. 8. The tricuspid valve is normal in structure. Tricuspid valve regurgitation is not demonstrated. 9. The aortic valve is normal in structure. Aortic valve regurgitation is not visualized. No evidence of aortic valve sclerosis or stenosis. 10. The pulmonic valve was grossly normal. Pulmonic valve regurgitation is not visualized. 11. TR signal is inadequate for assessing pulmonary artery systolic pressure. 12. The inferior vena cava is normal in size with greater than 50% respiratory variability, suggesting right atrial pressure of 3 mmHg.  Portable Chest Xray 07/16/2019 1.  Endotracheal tube and NG tube in stable position. 2. Low lung volumes with mild bibasilar atelectasis again noted without interim change.  07/15/2019 Tube positions as described without pneumothorax. Mild bibasilar atelectasis. No edema or consolidation. Heart upper normal in size.     HISTORY OF PRESENT ILLNESS Kathryn Coffey is a 48 y.o. female who was last seen normal 5 AM 07/15/2019 ([facility]) when she  went to the bathroom at 1000 am and fell. She refused  to go to the hospital. After the fall she was complaining of significant headache. Later patient was noted to be flaccid on the left upper and lower extremity. Ambulance called back. Code stroke called. En route BP 132/95, CBG 141, nausea but no vomiting. On arrival patient alert and oriented with significant left sided deficits. CT head along with CTA head and neck/perfusion with R ICA and MCA occlusion with penumbra . Premorbid modified Rankin scale (mRS): 0. She was not a tPA candidate as she was out of the window. She was enrolled in the Timeless trial (tenecteplase and was taken to IR for mechanical thrombectomy  HOSPITAL COURSE Kathryn Coffey is a 48 y.o. female with history of morbid obesity, DB, HTN, migraine, asthma, GERD who fell at home and refused to go to the hospital. Later she c/o of HA and noted to be flaccid on her L side.  Enrolled in the Timeless Trial. Taken to IR. Received tenecteplase at 1334 on 07/15/2019.  Stroke:   R MCA infarct w/ HT due to right ICA and MCA occlusion s/p tenecteplase (Timeless) and IR w/ TICI3 revascularization and R ICA stent, now stent re-occluded - embolic likely due to ICA dissection  Code Stroke CT head R basal ganglia infarct w/ hypoattenuation R lentiform nucleus. Hyperdense R MCA. ASPECTS 9    CTA head & neck R ICA and MCA occlusion.   CT perfusion positive for penumbra  Cerebra angio occluded R MCA M1 w/ TICI3 revascularization. Repair of R ICA proximal occlusive dissection w/ flow diverter  Repeat CTA head & neck reocclusion R ICA near origin. R ICA stent is occluded. R MCA patent. L ICA and B VA open.   MRI - 11/26 - R basal ganglia infarct w/ hemorrhagic transformation into R caudate and R lentiform nucleus. Infarct into insula and R temporal lobe. Small R frontal lobe infarct.  2D Echo - EF 60-65%. No source of embolus   Consider 30 day cardiac event monitoring as outpt after d/c  from CIR  LDL 41  HgbA1c 8.0  lovenox for VTE prophylaxis  No antithrombotic prior to admission, now on aspirin 81 mg daily and Brilinta (ticagrelor) 90 mg bid given stent placement.  Therapy recommendations:  CIR   Disposition:  CIR   Carotid dissection / occlusion, right   S/p stent, reoccluded post placement  Avoid low BP  long term BP goal 130-150   Hypertension  Home meds:  Losartan-HCTZ 100-25, triamterene-HCTZ 34.5-25  Stable  No BP meds now  SBP goal 130-150 given stent occlusion    Diabetes type II Uncontrolled  Home meds:  victoza 18, metformin 500 bid  HgbA1c 8.0, goal < 7.0  CBGs  SSI  Glucose in control now  On levemir 10 u q hs   DB RN following  Other Stroke Risk Factors  ETOH use  Morbid Obesity, Body mass index is 45.64 kg/m., recommend weight loss, diet and exercise as appropriate   Migraines  Other Active Problems  Possible ocular herpes. On valacyclovir 1000 tid  L shoulder pain. Present PTA. Worsened post stroke with new L hemiparesis. On ultram 50 q 6h prn. PM&R to consider injection.    Acute blood loss anemia 11.9->10.5->9.3->8.8->8.7->8.9->8.8->8.9  (monitor). Fer low (6), Fe low (27), TIBC low (482)  Mild Leukocytosis - 10.7->15.0->10.3->11.6->10.8->10.7->12.0  (afebrile)  Insomnia. Melatonin added   DISCHARGE EXAM Blood pressure 125/81, pulse 70, temperature 98.2 F (36.8 C), temperature source Oral, resp. rate 18, height 5\' 5"  (1.651 m), weight  124.4 kg, SpO2 95 %. General - obese, well developed, in no apparent distress.  Ophthalmologic - fundi not visualized due to noncooperation.  Cardiovascular - Regular rhythm and rate.  Mental Status -  Level of arousal and orientation to time, place, and person were intact. Language including expression, naming, repetition, comprehension was assessed and found intact. Fund of Knowledge was assessed and was intact.  Cranial Nerves II - XII - II - Visual  field intact OU. III, IV, VI - Extraocular movements intact. V - Facial sensation intact bilaterally. VII - left facial droop. VIII - Hearing & vestibular intact bilaterally. X - Palate elevates symmetrically. XI - Chin turning & shoulder shrug intact bilaterally. XII - Tongue protrusion intact.  Motor Strength - The patient's strength was normal in right upper and lower extremities, however, left UE proximal 1/5, bicep 1/5, tricep 3/5 and finger movement 0/5. LLE 4+/5 proximal and distal.  Bulk was normal and fasciculations were absent.  Motor Tone - Muscle tone was assessed at the neck and appendages and was normal.  Reflexes - The patient's reflexes were symmetrical in all extremities and she had no pathological reflexes.  Sensory - Light touch, temperature/pinprick were assessed and were symmetrical.    Coordination - The patient had normal movements in the right hands with no ataxia or dysmetria.  Tremor was absent.  Gait and Station - deferred.  Discharge Diet  Heart healthy/carb modified thin liquids  DISCHARGE PLAN  Disposition:  Transfer to [facility] for ongoing PT, OT and ST  aspirin 81 mg daily and Brilinta (ticagrelor) 90 mg bid for secondary stroke prevention given stent placement  Please arranged 30d monitor to look for AF as possible source of stroke following d/c from [place] ongoing stroke risk factor control by Primary Care Physician at time of discharge from inpatient rehabilitation.  Follow-up Rusty Aus, MD in 2 weeks following discharge from rehab.  Follow-up in [facility] in 4 weeks following discharge from rehab, office to schedule an appointment.   Follow up Willaim Rayas [facility]) Estanislado Pandy, MD (Interventional Neuroradiologist) in 4 weeks. Office will scheduled you an appt.  35 minutes were spent preparing discharge.  Rosalin Hawking, MD PhD Stroke Neurology 07/22/2019 11:27 PM

## 2019-07-21 NOTE — H&P (Signed)
Physical Medicine and Rehabilitation Admission H&P    Chief Complaint  Patient presents with  . Stroke with functional deficits    HPI: Kathryn Coffey is a 48 year old Rh-female with history of HTN, T2DM, morbid obesity, sinus issues/allergies, ocular herpes a year ago who was admitted on 07/15/19 with reports of all night prior to admission with onset of HA and developed flaccid Left hemiparesis a couple of hours later. CT head showed acute non-hemorrhageic infarct in right basal ganglia with hyperdense R-MCA signt. CTA/P head neck showed perfusion deficit with occluded right ICA and slow flow M!. She underwent cerebral angio with revascularization of R-MCA with T1C1 revascularization and repair of R-ICA with flow diverter device. Post procedure CT  Head negative for bleed and repeat CTA head/neck showed reocclusion of R- ICA at origin with occlusion of R-ICA stent but now with patent R-MCA. 2 D echo showed normal EF with grade 3 diastolic dysfunction. She tolerated extubation 11/24 and follow up MRI brain 11/26 showed acute infarct in right basal ganglia with hemorrhagic transformation in right caudate and right lenticular nucleus extending into insula and right temporal lobe and small acute infarct in right frontal lobe and occluded R-ICA.  To continue ASA/Brillinta due to stent and Dr. Erlinda Hong recommends 30 day event monitor after discharge. Long term BP goal 130-150 range given stent occlusion.  Patient with resultant left sided weakness with left inattention affecting mobility and ADLs. CIR recommended due to functional decline.   Today she complains of right shoulder pain and says she has a history of scapular dyskinesis for which she had PT in the past. She has been given ice and Tramadol but has not felt any relief yet. She has never had a steroid injection and does have diabetes mellitus.   Yesterday she reported depression and became tearful. She takes Wellbutrin at home for depression.   She is eager to come to rehab.  Her husband at bedside is very supportive and would like to be involved in her rehabilitation care. He is taking time off of work to do so and would like to know the hours during which she will receive therapy.     Review of Systems  Constitutional: Negative for chills and fever.  HENT: Positive for sinus pain. Negative for hearing loss and tinnitus.   Eyes: Negative for blurred vision and double vision.  Respiratory: Positive for shortness of breath. Negative for cough.   Cardiovascular: Negative for chest pain and palpitations.  Gastrointestinal: Positive for constipation. Negative for heartburn and nausea.  Genitourinary: Positive for frequency and urgency.  Musculoskeletal: Positive for joint pain (bilateral meniscus tears wtih instability. ) and myalgias. Negative for falls.  Skin: Negative for rash.  Neurological: Positive for sensory change (on the left now) and headaches. Negative for dizziness and focal weakness.  Psychiatric/Behavioral: The patient does not have insomnia.      Past Medical History:  Diagnosis Date  . Abdominal wall cellulitis 2013 and 2014  . Asthma    allergic to grasses  . B12 deficiency   . Complication of anesthesia   . Costochondritis   . Diabetes mellitus without complication ([place])   . Gastric anomaly    multiple small ulcers  . GERD (gastroesophageal reflux disease)   . Herpes 06/2018   POSSIBLY IN EYE- PT TAKING VALTREX AND WILL SEE OPTHAMOLOGIST TO SEE IF VALTREX IS WORKING  . History of abnormal cervical Pap smear   . History of Clostridium difficile colitis   .  History of hiatal hernia   . History of kidney stones   . Hypertension   . IBS (irritable bowel syndrome)   . Migraine   . Morbid obesity ([place])    s/p attempted gastric banding now decompressed  . PCOS (polycystic ovarian syndrome)   . PONV (postoperative nausea and vomiting)    WITH SPINAL ONLY    Past Surgical History:  Procedure Laterality  Date  . BREAST EXCISIONAL BIOPSY Left    cyst excision - Dr. Patty Sermons  . CESAREAN SECTION    . CHOLECYSTECTOMY    . COLONOSCOPY    . EAR CYST EXCISION Right 07/04/2018   Procedure: EXCISION GLOMUS TUMOR THUMBNAIL;  Surgeon: Corky Mull, MD;  Location: [facility];  Service: Orthopedics;  Laterality: Right;  . ESOPHAGOGASTRODUODENOSCOPY    . ESOPHAGOGASTRODUODENOSCOPY (EGD) WITH PROPOFOL N/A 12/06/2015   Procedure: ESOPHAGOGASTRODUODENOSCOPY (EGD) WITH PROPOFOL;  Surgeon: Manya Silvas, MD;  Location: [facility] END[facility];  Service: Endoscopy;  Laterality: N/A;  . IR PERCUTANEOUS ART THROMBECTOMY/INFUSION INTRACRANIAL INC DIAG ANGIO  07/15/2019  . LAPAROSCOPIC GASTRIC BANDING    . LAPAROSCOPIC GASTRIC RESTRICTIVE DUODENAL PROCEDURE (DUODENAL SWITCH) N/A 01/25/2016   Procedure: LAPAROSCOPIC GASTRIC RESTRICTIVE DUODENAL PROCEDURE (DUODENAL SWITCH);  Surgeon: Ladora Daniel, MD;  Location: [facility] OR[facility];  Service: General;  Laterality: N/A;  . OVARY SURGERY     x2  . RADIOLOGY WITH ANESTHESIA N/A 07/15/2019   Procedure: IR WITH ANESTHESIA;  Surgeon: Luanne Bras, MD;  Location: [facility];  Service: Radiology;  Laterality: N/A;  . TONSILLECTOMY    . TUBAL LIGATION    . UMBILICAL HERNIA REPAIR N/A 01/25/2016   Procedure: LAPAROSCOPIC UMBILICAL HERNIA;  Surgeon: Ladora Daniel, MD;  Location: [facility];  Service: General;  Laterality: N/A;  . UMBILICAL HERNIA REPAIR N/A 10/20/2016   Procedure: HERNIA REPAIR UMBILICAL ADULT;  Surgeon: Leonie Green, MD;  Location: [facility];  Service: General;  Laterality: N/A;    Family History  Problem Relation Age of Onset  . Breast cancer Maternal Grandmother 6  . Diabetes Mother   . Diabetes Father     Social History:  Married. 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 She reports that she has never smoked. She has never used smokeless tobacco. She reports 2 shots of vodka/week. She reports that she does not use drugs.    Allergies  Allergen Reactions   . Azithromycin Other (See Comments)    Abdominal wall abcess  . Ivp Dye [Iodinated Diagnostic Agents] Hives  . Lexapro [Escitalopram] Other (See Comments)    migraine  . Vicodin [Hydrocodone-Acetaminophen] Other (See Comments)    migraine    Medications Prior to Admission  Medication Sig Dispense Refill  . albuterol (PROVENTIL HFA;VENTOLIN HFA) 108 (90 Base) MCG/ACT inhaler Inhale 2 puffs into the lungs every 6 (six) hours as needed for wheezing or shortness of breath.    . amphetamine-dextroamphetamine (ADDERALL) 10 MG tablet Take 10 mg by mouth See admin instructions. Take 10 mg by mouth daily Monday through Friday.    Marland Kitchen buPROPion (WELLBUTRIN) 75 MG tablet Take 75 mg by mouth 2 (two) times daily.    . Calcium Carb-Cholecalciferol (CALCIUM + VITAMIN D3 PO) Take 1 tablet by mouth daily.    . fexofenadine (ALLEGRA) 180 MG tablet Take 180 mg by mouth daily as needed for allergies.     . fluticasone (FLONASE) 50 MCG/ACT nasal spray Place 2 sprays into both nostrils daily as needed for allergies or rhinitis.     Marland Kitchen  liraglutide (VICTOZA) 18 MG/3ML SOPN Inject 1.2 mg into the skin daily.    Marland Kitchen losartan-hydrochlorothiazide (HYZAAR) 100-25 MG tablet Take 1 tablet by mouth every morning.     . metFORMIN (GLUCOPHAGE) 500 MG tablet Take 500 mg by mouth 2 (two) times daily.  11  . Multiple Vitamins-Minerals (MULTIVITAMIN PO) Take 2 tablets by mouth daily.    . NON FORMULARY 1 Dose once a week. Wednesdays    . omeprazole (PRILOSEC) 20 MG capsule Take 20 mg by mouth daily.    Marland Kitchen PREDNISONE PO Take 1 tablet by mouth as needed (headache).    . traMADol (ULTRAM) 50 MG tablet Take 1 tablet (50 mg total) by mouth every 6 (six) hours as needed for moderate pain. 20 tablet 0  . triamterene-hydrochlorothiazide (DYAZIDE) 37.5-25 MG capsule Take 1 capsule by mouth every morning.       Drug Regimen Review  Drug regimen was reviewed and remains appropriate with no significant issues identified  Home: Home Living  Family/patient expects to be discharged to:: Private residence Living Arrangements: Spouse/significant other Available Help at Discharge: Family, Available PRN/intermittently, Available 24 hours/day, Other (Comment) Type of Home: House Home Access: Stairs to enter [employer] of Steps: 2-4 Entrance Stairs-Rails: None Home Layout: One level Bathroom Shower/Tub: Chiropodist: Standard Home Equipment: Crutches  Lives With: Spouse   Functional History: Prior Function Level of Independence: Independent  Functional Status:  Mobility: Bed Mobility Overal bed mobility: Needs Assistance Bed Mobility: Supine to Sit Rolling: Mod assist, +2 for physical assistance, +2 for safety/equipment Sidelying to sit: Mod assist, +2 for physical assistance, +2 for safety/equipment Supine to sit: Min guard, HOB elevated Sit to supine: Max assist, +2 for physical assistance General bed mobility comments: not assessed, patient received in recliner and returned to recliner Transfers Overall transfer level: Needs assistance Equipment used: Hemi-walker Transfers: Sit to/from Stand Sit to Stand: Min assist, +2 physical assistance Stand pivot transfers: Mod assist, +2 physical assistance, +2 safety/equipment General transfer comment: cues to push up from recliner, reach back for recliner Ambulation/Gait Ambulation/Gait assistance: Min assist, +2 safety/equipment Gait Distance (Feet): 75 Feet Assistive device: Hemi-walker Gait Pattern/deviations: Step-to pattern, Decreased dorsiflexion - left, Steppage General Gait Details: patient slightly impulsive, requires cues to stop and regroup when out of sequence. Increased fall risk Gait velocity: decreased Gait velocity interpretation: <1.8 ft/sec, indicate of risk for recurrent falls    ADL: ADL Overall ADL's : Needs assistance/impaired Eating/Feeding: Set up, Sitting Eating/Feeding Details (indicate cue type and reason): pt  reports difficulty opening containers during breakfast, ate before OT arrival Grooming: Moderate assistance, Standing, Wash/dry hands, Wash/dry face, Brushing hair Grooming Details (indicate cue type and reason): completed while standing at the sink;initiated incorporating LUE into handwashing Upper Body Bathing: Moderate assistance, Sitting Lower Body Bathing: Maximal assistance, Total assistance, Sitting/lateral leans, Sit to/from stand Upper Body Dressing : Moderate assistance, Sitting Lower Body Dressing: Maximal assistance, Sitting/lateral leans, Sit to/from stand, +2 for physical assistance, +2 for safety/equipment Lower Body Dressing Details (indicate cue type and reason): assistance to don/doff socks Toilet Transfer: Moderate assistance, +2 for physical assistance, Stand-pivot, Ambulation, BSC Toilet Transfer Details (indicate cue type and reason): pt with urinary incontinence in standing, required modA+2 for safety for stand pivot toward R to [facility] Toileting- Clothing Manipulation and Hygiene: Moderate assistance, Sitting/lateral lean Toileting - Clothing Manipulation Details (indicate cue type and reason): pt completed pericare with modA for safety Functional mobility during ADLs: Moderate assistance, +2 for physical assistance, +2 for  safety/equipment, Cueing for safety, Cueing for sequencing General ADL Comments: ambulated from EOB to sink with modA+2 at [facility] level:pt completed ADL while standing at sink level, required L knee block and vc for weight shifting and attending to L side of environment;  Cognition: Cognition Overall Cognitive Status: Impaired/Different from baseline Arousal/Alertness: Awake/alert Orientation Level: Oriented X4 Attention: Selective Selective Attention: Impaired Selective Attention Impairment: Verbal complex, Functional complex Memory: Appears intact Awareness: Appears intact Problem Solving: Appears intact Behaviors: Impulsive Safety/Judgment: Appears  intact Cognition Arousal/Alertness: Awake/alert Behavior During Therapy: WFL for tasks assessed/performed Overall Cognitive Status: Impaired/Different from baseline Area of Impairment: Following commands, Awareness, Safety/judgement Memory: Decreased short-term memory Following Commands: Follows one step commands with increased time Safety/Judgement: Decreased awareness of safety, Decreased awareness of deficits Awareness: Emergent Problem Solving: Requires verbal cues, Difficulty sequencing General Comments: Pt demonstrates impulsivity and decreased awareness of deficits, required frequent vc for safety awareness, safe hand placement, and attending to the left side of enviornment;pt required increased time for processing;would attend to left side of environment with vc;pt teary eyed when discussing deficits Difficult to assess due to: Level of arousal(Pt was lethargic at first, but continued to wake up )   Blood pressure (!) 121/91, pulse 73, temperature 98.5 F (36.9 C), temperature source Oral, resp. rate 16, height 5\' 5"  (1.651 m), weight 124.4 kg, SpO2 97 %.   Physical Exam  Gen: no distress, normal appearing HEENT: oral mucosa pink and moist, NCAT Cardio: Reg rate Chest: normal effort, normal rate of breathing Abd: soft, non-distended Ext: no edema Skin: intact Neuro: AOx3. Left facial droop. CN 2-12 otherwise intact.  Musculoskeletal: 5/5 strength on right side. LUE: 1/5 in EF and finger flexion. 0/5 in EE and WF. LLE: 3/5 throughout. Sensation intact.  Psych: pleasant, depressed mood  Results for orders placed or performed during the hospital encounter of 07/15/19 (from the past 48 hour(s))  Glucose, capillary     Status: Abnormal   Collection Time: 07/20/19 12:02 PM  Result Value Ref Range   Glucose-Capillary 143 (H) 70 - 99 mg/dL   Comment 1 Notify RN    Comment 2 Document in Chart   Glucose, capillary     Status: Abnormal   Collection Time: 07/20/19  6:40 PM   Result Value Ref Range   Glucose-Capillary 206 (H) 70 - 99 mg/dL   Comment 1 Notify RN    Comment 2 Document in Chart   Glucose, capillary     Status: Abnormal   Collection Time: 07/20/19  9:19 PM  Result Value Ref Range   Glucose-Capillary 137 (H) 70 - 99 mg/dL  CBC     Status: Abnormal   Collection Time: 07/21/19  3:42 AM  Result Value Ref Range   WBC 10.7 (H) 4.0 - 10.5 K/uL   RBC 3.72 (L) 3.87 - 5.11 MIL/uL   Hemoglobin 8.8 (L) 12.0 - 15.0 g/dL    Comment: Reticulocyte Hemoglobin testing may be clinically indicated, consider ordering this additional test [id number]    HCT 28.8 (L) 36.0 - 46.0 %   MCV 77.4 (L) 80.0 - 100.0 fL   MCH 23.7 (L) 26.0 - 34.0 pg   MCHC 30.6 30.0 - 36.0 g/dL   RDW 17.3 (H) 11.5 - 15.5 %   Platelets 427 (H) 150 - 400 K/uL   nRBC 0.0 0.0 - 0.2 %    Comment: Performed at [facility], [address], [place], [place] [id number]  Basic metabolic panel     Status:  Abnormal   Collection Time: 07/21/19  3:42 AM  Result Value Ref Range   Sodium 137 135 - 145 mmol/L   Potassium 3.6 3.5 - 5.1 mmol/L   Chloride 101 98 - 111 mmol/L   CO2 25 22 - 32 mmol/L   Glucose, Bld 153 (H) 70 - 99 mg/dL   BUN 14 6 - 20 mg/dL   Creatinine, Ser 0.60 0.44 - 1.00 mg/dL   Calcium 8.6 (L) 8.9 - 10.3 mg/dL   GFR calc non Af Amer >60 >60 mL/min   GFR calc Af Amer >60 >60 mL/min   Anion gap 11 5 - 15    Comment: Performed at [facility], [place] [address] [place], [place] [address]  Glucose, capillary     Status: Abnormal   Collection Time: 07/21/19  6:03 AM  Result Value Ref Range   Glucose-Capillary 116 (H) 70 - 99 mg/dL  Glucose, capillary     Status: Abnormal   Collection Time: 07/21/19 11:42 AM  Result Value Ref Range   Glucose-Capillary 149 (H) 70 - 99 mg/dL   Comment 1 Notify RN    Comment 2 Document in Chart   Glucose, capillary     Status: Abnormal   Collection Time: 07/21/19  4:26 PM  Result Value Ref Range   Glucose-Capillary 142 (H) 70 - 99  mg/dL   Comment 1 Notify RN    Comment 2 Document in Chart   Glucose, capillary     Status: Abnormal   Collection Time: 07/21/19  9:25 PM  Result Value Ref Range   Glucose-Capillary 130 (H) 70 - 99 mg/dL  Basic metabolic panel     Status: Abnormal   Collection Time: 07/22/19  4:08 AM  Result Value Ref Range   Sodium 139 135 - 145 mmol/L   Potassium 3.5 3.5 - 5.1 mmol/L   Chloride 105 98 - 111 mmol/L   CO2 26 22 - 32 mmol/L   Glucose, Bld 145 (H) 70 - 99 mg/dL   BUN 15 6 - 20 mg/dL   Creatinine, Ser 0.65 0.44 - 1.00 mg/dL   Calcium 8.8 (L) 8.9 - 10.3 mg/dL   GFR calc non Af Amer >60 >60 mL/min   GFR calc Af Amer >60 >60 mL/min   Anion gap 8 5 - 15    Comment: Performed at [facility], [place] [address] [place], [place] [address]  CBC     Status: Abnormal   Collection Time: 07/22/19  4:08 AM  Result Value Ref Range   WBC 12.0 (H) 4.0 - 10.5 K/uL   RBC 3.81 (L) 3.87 - 5.11 MIL/uL   Hemoglobin 8.9 (L) 12.0 - 15.0 g/dL   HCT 29.7 (L) 36.0 - 46.0 %   MCV 78.0 (L) 80.0 - 100.0 fL   MCH 23.4 (L) 26.0 - 34.0 pg   MCHC 30.0 30.0 - 36.0 g/dL   RDW 17.7 (H) 11.5 - 15.5 %   Platelets 465 (H) 150 - 400 K/uL   nRBC 0.0 0.0 - 0.2 %    Comment: Performed at [facility], [place] [address] [place], [place] [address]  Iron and TIBC     Status: Abnormal   Collection Time: 07/22/19  4:08 AM  Result Value Ref Range   Iron 27 (L) 28 - 170 ug/dL   TIBC 482 (H) 250 - 450 ug/dL   Saturation Ratios 6 (L) 10.4 - 31.8 %   UIBC 455 ug/dL  Comment: Performed at [facility], [place] [address] [place], [place] [address]  Ferritin     Status: Abnormal   Collection Time: 07/22/19  4:08 AM  Result Value Ref Range   Ferritin 9 (L) 11 - 307 ng/mL    Comment: Performed at [facility], [place] [address], [place], [place] [address]  Glucose, capillary     Status: Abnormal   Collection Time: 07/22/19  6:37 AM  Result Value Ref Range   Glucose-Capillary 118 (H) 70 - 99 mg/dL   No  results found.     Medical Problem List and Plan: 1.  Impaired mobility and strength secondary to R MCA infarct  -patient may shower  -ELOS/Goals: Mod I PT, MinA OT, I in SLP 2.  Antithrombotics: -DVT/anticoagulation:  Pharmaceutical: Lovenox  -antiplatelet therapy: ASA/Brillinta 3. Chronic R>L knee pain/Pain Management: will add Sportscreme prn. May need right knee sleeve for support.   Chronic right shoulder scapular dyskinesis--likely worsened by increased use due to flaccid left extremity. Could potentially benefit from a steroid injection. She is diabetic so would have to monitor BG levels.  4. Mood: LCSW to follow for evaluation and support.   -antipsychotic agents: N/A  Reports pre-morbid and current depression. I advised that depression is very common post-stroke and we can help her both with therapy and medication. She was on Wellbutrin at home and I would recommend restarting. She may also benefit from neuropsych eval.  5. Neuropsych: This patient is capable of making decisions on her own behalf. 6. Skin/Wound Care: Routine pressure relief measures.  7. Fluids/Electrolytes/Nutrition: Monitor I/O. Check lytes in am.  8. Chronic HA: Takes goody powders/Motrin/Ultram. Continue tylenol prn. May add low dose topamax if it does not improve.  9. T2DM: Hgb A1c-8.0.  Monitor diet "sometimes.   Resume home dose metformin and discontinue Levemir once Victoza available (pharmacy to order or patient able to bring in).   10. H/o depression: Uses Wellbutrin bid with adderal for attention/focus.  11. H/o band failure/gastric sleeve: Continue multivitamin. Appetite has been good.  12. Headaches/Sinus pressure: Felt to be due to allergies. Resume Flonase and allegra to help manage symptoms. Resume allergy injections after discharge.   Bary Leriche, PA-C 07/22/2019   I have personally performed a face to face diagnostic evaluation, including, but not limited to relevant history and physical exam  findings, of this patient and developed relevant assessment and plan.  Additionally, I have reviewed and concur with the physician assistant's documentation above.  Leeroy Cha, MD

## 2019-07-21 NOTE — Progress Notes (Signed)
Occupational Therapy Treatment Patient Details Name: Kathryn Coffey MRN: [id number] DOB: August 11, 1971 Today's Date: 07/21/2019    History of present illness pt is a 48 y/o female admitted with left hemiplegia with right basal ganglia infarct. PMHx: DM, HTN and obesity   OT comments  Pt received in bed, laying on LUE, pt reports she was unaware. Educated pt on importance of keeping LUE within sight and elevated. Pt demonstrates progress toward established OT goals. Pt ambulated from EOB to sink with modA+2 at RW level. Pt requires vc to attend to left side of environment. Pt completed grooming task while standing at sink level with modA+2. Pt will continue to benefit from skilled OT services to maximize safety and independence with ADL/IADL and functional mobility. Will continue to follow acutely and progress as tolerated.    Follow Up Recommendations  CIR;Supervision/Assistance - 24 hour    Equipment Recommendations  3 in 1 bedside commode    Recommendations for Other Services      Precautions / Restrictions Precautions Precautions: Fall Precaution Comments: left hemiparesis Restrictions Weight Bearing Restrictions: No       Mobility Bed Mobility Overal bed mobility: Needs Assistance Bed Mobility: Supine to Sit     Supine to sit: Min guard;HOB elevated     General bed mobility comments: minguard for safety of LUE during mobility  Transfers Overall transfer level: Needs assistance Equipment used: 2 person hand held assist Transfers: Sit to/from [facility] Sit to Stand: Mod assist;+2 physical assistance;+2 safety/equipment Stand pivot transfers: Mod assist;+2 physical assistance;+2 safety/equipment       General transfer comment: cues for hand placement and assist to powerup and for stability in standing    Balance Overall balance assessment: Needs assistance Sitting-balance support: Feet supported;Single extremity supported;No upper extremity  supported Sitting balance-Leahy Scale: Poor Sitting balance - Comments: reliant on RUE for sitting balance   Standing balance support: Single extremity supported;Bilateral upper extremity supported;During functional activity Standing balance-Leahy Scale: Poor Standing balance comment: +2 for control of L Knee and stability for w/shift assist                           ADL either performed or assessed with clinical judgement   ADL Overall ADL's : Needs assistance/impaired Eating/Feeding: Set up;Sitting Eating/Feeding Details (indicate cue type and reason): pt reports difficulty opening containers during breakfast, ate before OT arrival Grooming: Moderate assistance;Standing;Wash/dry hands;Wash/dry face;Brushing hair Grooming Details (indicate cue type and reason): completed while standing at the sink;initiated incorporating LUE into handwashing             Lower Body Dressing: Maximal assistance;Sitting/lateral leans;Sit to/from stand;+2 for physical assistance;+2 for safety/equipment Lower Body Dressing Details (indicate cue type and reason): assistance to don/doff socks Toilet Transfer: Moderate assistance;+2 for physical assistance;Stand-pivot;Ambulation;BSC Toilet Transfer Details (indicate cue type and reason): pt with urinary incontinence in standing, required modA+2 for safety for stand pivot toward R to [place] Toileting- Clothing Manipulation and Hygiene: Moderate assistance;Sitting/lateral lean Toileting - Clothing Manipulation Details (indicate cue type and reason): pt completed pericare with modA for safety     Functional mobility during ADLs: Moderate assistance;+2 for physical assistance;+2 for safety/equipment;Cueing for safety;Cueing for sequencing General ADL Comments: ambulated from EOB to sink with modA+2 at [facility] level:pt completed ADL while standing at sink level, required L knee block and vc for weight shifting and attending to L side of environment;      Vision   Vision Assessment?: Vision  impaired- to be further tested in functional context Additional Comments: attended to L side of environment with vc, able to see colors correctly, no visual field deficit apparent   Perception     Praxis      Cognition Arousal/Alertness: Awake/alert Behavior During Therapy: WFL for tasks assessed/performed;Flat affect Overall Cognitive Status: Impaired/Different from baseline Area of Impairment: Memory;Following commands;Safety/judgement;Awareness;Problem solving                     Memory: Decreased short-term memory Following Commands: Follows one step commands with increased time Safety/Judgement: Decreased awareness of deficits;Decreased awareness of safety Awareness: Emergent Problem Solving: Slow processing;Difficulty sequencing;Requires verbal cues;Requires tactile cues General Comments: Pt demonstrates impulsivity and decreased awareness of deficits, required frequent vc for safety awareness, safe hand placement, and attending to the left side of enviornment;pt required increased time for processing;would attend to left side of environment with vc;pt teary eyed when discussing deficits        Exercises     Shoulder Instructions       General Comments vss;educated pt on importance of elevating LUE    Pertinent Vitals/ Pain       Pain Assessment: No/denies pain Pain Intervention(s): Monitored during session  Home Living                                          Prior Functioning/Environment              Frequency  Min 3X/week        Progress Toward Goals  OT Goals(current goals can now be found in the care plan section)  Progress towards OT goals: Progressing toward goals  Acute Rehab OT Goals Patient Stated Goal: back to independence OT Goal Formulation: With patient Time For Goal Achievement: 08/01/19 Potential to Achieve Goals: Good ADL Goals Pt Will Perform Eating: with modified  independence;sitting Pt Will Perform Grooming: with min assist;sitting Pt Will Perform Upper Body Dressing: with min assist;sitting Pt Will Perform Lower Body Dressing: with min assist;with adaptive equipment;sitting/lateral leans;sit to/from stand Pt Will Transfer to Toilet: with min assist;stand pivot transfer;bedside commode Pt/caregiver will Perform Home Exercise Program: Left upper extremity;Increased strength;Increased ROM;With minimal assist;With theraputty  Plan Discharge plan remains appropriate    Co-evaluation                 AM-PAC OT "6 Clicks" Daily Activity     Outcome Measure   Help from another person eating meals?: A Little Help from another person taking care of personal grooming?: A Lot Help from another person toileting, which includes using toliet, bedpan, or urinal?: A Lot Help from another person bathing (including washing, rinsing, drying)?: A Lot Help from another person to put on and taking off regular upper body clothing?: A Lot Help from another person to put on and taking off regular lower body clothing?: A Lot 6 Click Score: 13    End of Session Equipment Utilized During Treatment: Gait belt;Rolling walker  OT Visit Diagnosis: Unsteadiness on feet (R26.81);Muscle weakness (generalized) (M62.81);Other symptoms and signs involving cognitive function;Hemiplegia and hemiparesis Hemiplegia - Right/Left: Left Hemiplegia - dominant/non-dominant: Non-Dominant Hemiplegia - caused by: Cerebral infarction   Activity Tolerance Patient tolerated treatment well   Patient Left in chair;with call bell/phone within reach;with chair alarm set   Nurse Communication Mobility status        Time: [id number] OT Time  Calculation (min): 40 min  Charges: OT General Charges $OT Visit: 1 Visit OT Treatments $Self Care/Home Management : 38-52 mins  Dorinda Hill OTR/L Acute Rehabilitation Services Office: [place] 07/21/2019,  9:58 AM

## 2019-07-21 NOTE — Progress Notes (Signed)
Physical Therapy Treatment Patient Details Name: Kathryn Coffey MRN: [id number] DOB: 1970-10-01 Today's Date: 07/21/2019    History of Present Illness pt is a 48 y/o female admitted with left hemiplegia with right basal ganglia infarct. PMHx: DM, HTN and obesity    PT Comments    Patient received in recliner. Agrees to PT session. Patient requires cues for safety with transfers, hand placement. +2 min A. Ambulated 25 feet initially with tennis shoes and hemi-walker. Then ambulated another 50 feet out in hallway with +2 min assist and hemi-walker. Decreased Left LE control and placement, decreased attention to left side. Cues needed for sequencing, safety, use of AD. Patient progressing well with therapy. Will continue to benefit from skilled PT while here to improve strength and independence with mobility.      Follow Up Recommendations  CIR     Equipment Recommendations  Other (comment)(TBD)    Recommendations for Other Services Rehab consult     Precautions / Restrictions Precautions Precautions: Fall Precaution Comments: left hemiparesis Restrictions Weight Bearing Restrictions: No    Mobility  Bed Mobility               General bed mobility comments: not assessed, patient received in recliner and returned to recliner  Transfers Overall transfer level: Needs assistance Equipment used: Hemi-walker Transfers: Sit to/from Stand Sit to Stand: Min assist;+2 physical assistance         General transfer comment: cues to push up from recliner, reach back for recliner  Ambulation/Gait Ambulation/Gait assistance: Min assist;+2 safety/equipment Gait Distance (Feet): 75 Feet Assistive device: Hemi-walker Gait Pattern/deviations: Step-to pattern;Decreased dorsiflexion - left;Steppage Gait velocity: decreased   General Gait Details: patient slightly impulsive, requires cues to stop and regroup when out of sequence. Increased fall risk   Stairs              Wheelchair Mobility    Modified Rankin (Stroke Patients Only) Modified Rankin (Stroke Patients Only) Pre-Morbid Rankin Score: No symptoms Modified Rankin: Moderately severe disability     Balance Overall balance assessment: Needs assistance Sitting-balance support: Feet supported Sitting balance-Leahy Scale: Fair     Standing balance support: Single extremity supported;During functional activity Standing balance-Leahy Scale: Fair                              Cognition Arousal/Alertness: Awake/alert Behavior During Therapy: WFL for tasks assessed/performed Overall Cognitive Status: Impaired/Different from baseline Area of Impairment: Following commands;Awareness;Safety/judgement                       Following Commands: Follows one step commands with increased time Safety/Judgement: Decreased awareness of safety;Decreased awareness of deficits Awareness: Emergent Problem Solving: Requires verbal cues;Difficulty sequencing        Exercises Other Exercises Other Exercises: LAQ, marching, toe taps x 12 reps on left.    General Comments        Pertinent Vitals/Pain Pain Assessment: No/denies pain    Home Living                      Prior Function            PT Goals (current goals can now be found in the care plan section) Acute Rehab PT Goals Patient Stated Goal: back to independence PT Goal Formulation: With patient/family Time For Goal Achievement: 07/30/19 Potential to Achieve Goals: Fair Progress towards PT goals: Progressing toward goals  Frequency    Min 4X/week      PT Plan Current plan remains appropriate    Co-evaluation              AM-PAC PT "6 Clicks" Mobility   Outcome Measure  Help needed turning from your back to your side while in a flat bed without using bedrails?: A Lot Help needed moving from lying on your back to sitting on the side of a flat bed without using bedrails?: A Lot Help  needed moving to and from a bed to a chair (including a wheelchair)?: A Little Help needed standing up from a chair using your arms (e.g., wheelchair or bedside chair)?: A Little Help needed to walk in hospital room?: A Lot Help needed climbing 3-5 steps with a railing? : Total 6 Click Score: 13    End of Session Equipment Utilized During Treatment: Gait belt Activity Tolerance: Patient tolerated treatment well Patient left: in chair;with family/visitor present;with call bell/phone within reach Nurse Communication: Mobility status PT Visit Diagnosis: Unsteadiness on feet (R26.81);Muscle weakness (generalized) (M62.81);Hemiplegia and hemiparesis;Other abnormalities of gait and mobility (R26.89) Hemiplegia - Right/Left: Left Hemiplegia - dominant/non-dominant: Non-dominant Hemiplegia - caused by: Cerebral infarction     Time: [id number] PT Time Calculation (min) (ACUTE ONLY): 28 min  Charges:  $Gait Training: 8-22 mins $Therapeutic Exercise: 8-22 mins                     Kala Gassmann, PT, GCS 07/21/19,2:14 PM

## 2019-07-22 ENCOUNTER — Inpatient Hospital Stay (HOSPITAL_COMMUNITY)
Admission: RE | Admit: 2019-07-22 | Discharge: 2019-08-13 | DRG: 057 | Disposition: A | Discharge status: Home Health | Payer: No Typology Code available for payment source | Source: Intra-hospital | Attending: Physical Medicine & Rehabilitation | Admitting: Physical Medicine & Rehabilitation

## 2019-07-22 ENCOUNTER — Other Ambulatory Visit: Payer: Self-pay

## 2019-07-22 ENCOUNTER — Other Ambulatory Visit: Payer: Self-pay | Admitting: Neurology

## 2019-07-22 ENCOUNTER — Encounter (HOSPITAL_COMMUNITY): Payer: Self-pay | Admitting: Interventional Radiology

## 2019-07-22 DIAGNOSIS — K219 Gastro-esophageal reflux disease without esophagitis: Secondary | ICD-10-CM | POA: Diagnosis present

## 2019-07-22 DIAGNOSIS — I6931 Attention and concentration deficit following cerebral infarction: Secondary | ICD-10-CM | POA: Diagnosis not present

## 2019-07-22 DIAGNOSIS — E1169 Type 2 diabetes mellitus with other specified complication: Secondary | ICD-10-CM | POA: Diagnosis present

## 2019-07-22 DIAGNOSIS — Z803 Family history of malignant neoplasm of breast: Secondary | ICD-10-CM | POA: Diagnosis not present

## 2019-07-22 DIAGNOSIS — I63511 Cerebral infarction due to unspecified occlusion or stenosis of right middle cerebral artery: Secondary | ICD-10-CM | POA: Diagnosis not present

## 2019-07-22 DIAGNOSIS — R1310 Dysphagia, unspecified: Secondary | ICD-10-CM | POA: Diagnosis present

## 2019-07-22 DIAGNOSIS — Z8619 Personal history of other infectious and parasitic diseases: Secondary | ICD-10-CM | POA: Diagnosis not present

## 2019-07-22 DIAGNOSIS — I69392 Facial weakness following cerebral infarction: Secondary | ICD-10-CM | POA: Diagnosis not present

## 2019-07-22 DIAGNOSIS — Z833 Family history of diabetes mellitus: Secondary | ICD-10-CM

## 2019-07-22 DIAGNOSIS — J45909 Unspecified asthma, uncomplicated: Secondary | ICD-10-CM | POA: Diagnosis present

## 2019-07-22 DIAGNOSIS — R32 Unspecified urinary incontinence: Secondary | ICD-10-CM | POA: Diagnosis present

## 2019-07-22 DIAGNOSIS — I10 Essential (primary) hypertension: Secondary | ICD-10-CM | POA: Diagnosis present

## 2019-07-22 DIAGNOSIS — Z794 Long term (current) use of insulin: Secondary | ICD-10-CM

## 2019-07-22 DIAGNOSIS — Z6841 Body Mass Index (BMI) 40.0 and over, adult: Secondary | ICD-10-CM | POA: Diagnosis not present

## 2019-07-22 DIAGNOSIS — B9620 Unspecified Escherichia coli [E. coli] as the cause of diseases classified elsewhere: Secondary | ICD-10-CM | POA: Diagnosis not present

## 2019-07-22 DIAGNOSIS — Z87442 Personal history of urinary calculi: Secondary | ICD-10-CM | POA: Diagnosis not present

## 2019-07-22 DIAGNOSIS — I69354 Hemiplegia and hemiparesis following cerebral infarction affecting left non-dominant side: Principal | ICD-10-CM

## 2019-07-22 DIAGNOSIS — W1811XA Fall from or off toilet without subsequent striking against object, initial encounter: Secondary | ICD-10-CM | POA: Diagnosis not present

## 2019-07-22 DIAGNOSIS — Z95820 Peripheral vascular angioplasty status with implants and grafts: Secondary | ICD-10-CM | POA: Diagnosis not present

## 2019-07-22 DIAGNOSIS — M25311 Other instability, right shoulder: Secondary | ICD-10-CM | POA: Diagnosis present

## 2019-07-22 DIAGNOSIS — R252 Cramp and spasm: Secondary | ICD-10-CM | POA: Diagnosis not present

## 2019-07-22 DIAGNOSIS — I63231 Cerebral infarction due to unspecified occlusion or stenosis of right carotid arteries: Secondary | ICD-10-CM

## 2019-07-22 DIAGNOSIS — N390 Urinary tract infection, site not specified: Secondary | ICD-10-CM | POA: Diagnosis not present

## 2019-07-22 DIAGNOSIS — Y92231 Patient bathroom in hospital as the place of occurrence of the external cause: Secondary | ICD-10-CM | POA: Diagnosis not present

## 2019-07-22 DIAGNOSIS — Z9049 Acquired absence of other specified parts of digestive tract: Secondary | ICD-10-CM

## 2019-07-22 DIAGNOSIS — Z91041 Radiographic dye allergy status: Secondary | ICD-10-CM

## 2019-07-22 DIAGNOSIS — E282 Polycystic ovarian syndrome: Secondary | ICD-10-CM | POA: Diagnosis present

## 2019-07-22 DIAGNOSIS — F419 Anxiety disorder, unspecified: Secondary | ICD-10-CM | POA: Diagnosis present

## 2019-07-22 DIAGNOSIS — G8929 Other chronic pain: Secondary | ICD-10-CM | POA: Diagnosis present

## 2019-07-22 DIAGNOSIS — Z79891 Long term (current) use of opiate analgesic: Secondary | ICD-10-CM

## 2019-07-22 DIAGNOSIS — G8320 Monoplegia of upper limb affecting unspecified side: Secondary | ICD-10-CM

## 2019-07-22 DIAGNOSIS — Z885 Allergy status to narcotic agent status: Secondary | ICD-10-CM

## 2019-07-22 DIAGNOSIS — F329 Major depressive disorder, single episode, unspecified: Secondary | ICD-10-CM | POA: Diagnosis present

## 2019-07-22 DIAGNOSIS — M25562 Pain in left knee: Secondary | ICD-10-CM | POA: Diagnosis present

## 2019-07-22 DIAGNOSIS — N760 Acute vaginitis: Secondary | ICD-10-CM | POA: Diagnosis not present

## 2019-07-22 DIAGNOSIS — Z79899 Other long term (current) drug therapy: Secondary | ICD-10-CM

## 2019-07-22 DIAGNOSIS — E669 Obesity, unspecified: Secondary | ICD-10-CM

## 2019-07-22 DIAGNOSIS — Z9851 Tubal ligation status: Secondary | ICD-10-CM | POA: Diagnosis not present

## 2019-07-22 DIAGNOSIS — D62 Acute posthemorrhagic anemia: Secondary | ICD-10-CM | POA: Diagnosis not present

## 2019-07-22 DIAGNOSIS — M7918 Myalgia, other site: Secondary | ICD-10-CM | POA: Diagnosis not present

## 2019-07-22 DIAGNOSIS — Z7902 Long term (current) use of antithrombotics/antiplatelets: Secondary | ICD-10-CM

## 2019-07-22 DIAGNOSIS — G8194 Hemiplegia, unspecified affecting left nondominant side: Secondary | ICD-10-CM | POA: Diagnosis not present

## 2019-07-22 DIAGNOSIS — I639 Cerebral infarction, unspecified: Secondary | ICD-10-CM

## 2019-07-22 DIAGNOSIS — Z79890 Hormone replacement therapy (postmenopausal): Secondary | ICD-10-CM

## 2019-07-22 DIAGNOSIS — R197 Diarrhea, unspecified: Secondary | ICD-10-CM | POA: Diagnosis not present

## 2019-07-22 DIAGNOSIS — I69391 Dysphagia following cerebral infarction: Secondary | ICD-10-CM | POA: Diagnosis not present

## 2019-07-22 DIAGNOSIS — Z9884 Bariatric surgery status: Secondary | ICD-10-CM

## 2019-07-22 DIAGNOSIS — R4587 Impulsiveness: Secondary | ICD-10-CM | POA: Diagnosis present

## 2019-07-22 DIAGNOSIS — Z888 Allergy status to other drugs, medicaments and biological substances status: Secondary | ICD-10-CM

## 2019-07-22 DIAGNOSIS — Z881 Allergy status to other antibiotic agents status: Secondary | ICD-10-CM

## 2019-07-22 DIAGNOSIS — R519 Headache, unspecified: Secondary | ICD-10-CM | POA: Diagnosis present

## 2019-07-22 DIAGNOSIS — M25561 Pain in right knee: Secondary | ICD-10-CM | POA: Diagnosis present

## 2019-07-22 HISTORY — DX: Cerebral infarction, unspecified: I63.9

## 2019-07-22 LAB — BASIC METABOLIC PANEL
Anion gap: 8 (ref 5–15)
BUN: 15 mg/dL (ref 6–20)
CO2: 26 mmol/L (ref 22–32)
Calcium: 8.8 mg/dL — ABNORMAL LOW (ref 8.9–10.3)
Chloride: 105 mmol/L (ref 98–111)
Creatinine, Ser: 0.65 mg/dL (ref 0.44–1.00)
GFR calc Af Amer: >60 mL/min (ref >60)
GFR calc non Af Amer: >60 mL/min (ref >60)
Glucose, Bld: 145 mg/dL — ABNORMAL HIGH (ref 70–99)
Potassium: 3.5 mmol/L (ref 3.5–5.1)
Sodium: 139 mmol/L (ref 135–145)

## 2019-07-22 LAB — CBC
HCT: 29.7 % — ABNORMAL LOW (ref 36.0–46.0)
Hemoglobin: 8.9 g/dL — ABNORMAL LOW (ref 12.0–15.0)
MCH: 23.4 pg — ABNORMAL LOW (ref 26.0–34.0)
MCHC: 30 g/dL (ref 30.0–36.0)
MCV: 78 fL — ABNORMAL LOW (ref 80.0–100.0)
Platelets: 465 10*3/uL — ABNORMAL HIGH (ref 150–400)
RBC: 3.81 MIL/uL — ABNORMAL LOW (ref 3.87–5.11)
RDW: 17.7 % — ABNORMAL HIGH (ref 11.5–15.5)
WBC: 12 10*3/uL — ABNORMAL HIGH (ref 4.0–10.5)
nRBC: 0 % (ref 0.0–0.2)

## 2019-07-22 LAB — GLUCOSE, CAPILLARY
Glucose-Capillary: 118 mg/dL — ABNORMAL HIGH (ref 70–99)
Glucose-Capillary: 176 mg/dL — ABNORMAL HIGH (ref 70–99)
Glucose-Capillary: 122 mg/dL — ABNORMAL HIGH (ref 70–99)
Glucose-Capillary: 135 mg/dL — ABNORMAL HIGH (ref 70–99)

## 2019-07-22 LAB — IRON AND TIBC
Iron: 27 ug/dL — ABNORMAL LOW (ref 28–170)
Saturation Ratios: 6 % — ABNORMAL LOW (ref 10.4–31.8)
TIBC: 482 ug/dL — ABNORMAL HIGH (ref 250–450)
UIBC: 455 ug/dL

## 2019-07-22 LAB — FERRITIN: Ferritin: 9 ng/mL — ABNORMAL LOW (ref 11–307)

## 2019-07-22 MED ORDER — METFORMIN HCL 500 MG PO TABS
500.0000 mg | ORAL_TABLET | Freq: Two times a day (BID) | ORAL | Status: DC
  Administered 2019-07-23 – 2019-08-13 (×43): 500 mg via ORAL
  Filled 2019-07-22 (×43): qty 1

## 2019-07-22 MED ORDER — ENOXAPARIN SODIUM 40 MG/0.4ML ~~LOC~~ SOLN: 40.0000 mg | SUBCUTANEOUS | Status: DC

## 2019-07-22 MED ORDER — OXYCODONE HCL 5 MG PO TABS
5.0000 mg | ORAL_TABLET | Freq: Four times a day (QID) | ORAL | Status: DC | PRN
  Administered 2019-07-22: 5 mg via ORAL
  Filled 2019-07-22: qty 1

## 2019-07-22 MED ORDER — INSULIN ASPART 100 UNIT/ML ~~LOC~~ SOLN
0.0000 [IU] | Freq: Three times a day (TID) | SUBCUTANEOUS | Status: DC
  Administered 2019-07-23: 3 [IU] via SUBCUTANEOUS

## 2019-07-22 MED ORDER — ACETAMINOPHEN 325 MG PO TABS
325.0000 mg | ORAL_TABLET | ORAL | Status: DC | PRN
  Administered 2019-07-24 (×2): 650 mg via ORAL
  Filled 2019-07-22 (×5): qty 2

## 2019-07-22 MED ORDER — OXYCODONE HCL 5 MG PO TABS: 5.0000 mg | ORAL_TABLET | Freq: Four times a day (QID) | ORAL | 0 refills | Status: DC | PRN

## 2019-07-22 MED ORDER — TOPIRAMATE 25 MG PO TABS
25.0000 mg | ORAL_TABLET | Freq: Every day | ORAL | Status: DC
  Administered 2019-07-22 – 2019-08-12 (×22): 25 mg via ORAL
  Filled 2019-07-22 (×22): qty 1

## 2019-07-22 MED ORDER — FLEET ENEMA 7-19 GM/118ML RE ENEM: 1.0000 | ENEMA | Freq: Once | RECTAL | Status: DC | PRN

## 2019-07-22 MED ORDER — POLYETHYLENE GLYCOL 3350 17 G PO PACK: 17.0000 g | PACK | Freq: Every day | ORAL | Status: DC | PRN

## 2019-07-22 MED ORDER — ASPIRIN 81 MG PO CHEW
81.0000 mg | CHEWABLE_TABLET | Freq: Every day | ORAL | Status: DC
  Administered 2019-07-23 – 2019-08-13 (×22): 81 mg via ORAL
  Filled 2019-07-22 (×22): qty 1

## 2019-07-22 MED ORDER — TRAMADOL HCL 50 MG PO TABS
50.0000 mg | ORAL_TABLET | Freq: Four times a day (QID) | ORAL | Status: DC | PRN
  Administered 2019-07-23 – 2019-07-29 (×9): 50 mg via ORAL
  Filled 2019-07-22 (×11): qty 1

## 2019-07-22 MED ORDER — DIPHENHYDRAMINE HCL 12.5 MG/5ML PO ELIX: 12.5000 mg | ORAL_SOLUTION | Freq: Four times a day (QID) | ORAL | Status: DC | PRN

## 2019-07-22 MED ORDER — TICAGRELOR 90 MG PO TABS
90.0000 mg | ORAL_TABLET | Freq: Two times a day (BID) | ORAL | Status: DC
  Administered 2019-07-22 – 2019-08-13 (×43): 90 mg via ORAL
  Filled 2019-07-22 (×44): qty 1

## 2019-07-22 MED ORDER — TROLAMINE SALICYLATE 10 % EX CREA
1.0000 "application " | TOPICAL_CREAM | Freq: Two times a day (BID) | CUTANEOUS | Status: DC | PRN
  Filled 2019-07-22: qty 85

## 2019-07-22 MED ORDER — MELATONIN 3 MG PO TABS
6.0000 mg | ORAL_TABLET | Freq: Every day | ORAL | Status: DC
  Filled 2019-07-22 (×2): qty 2

## 2019-07-22 MED ORDER — TICAGRELOR 90 MG PO TABS
90.0000 mg | ORAL_TABLET | Freq: Two times a day (BID) | ORAL | Status: DC
  Administered 2019-07-26: 90 mg
  Filled 2019-07-22 (×7): qty 1

## 2019-07-22 MED ORDER — INSULIN ASPART 100 UNIT/ML ~~LOC~~ SOLN: 0.0000 [IU] | Freq: Three times a day (TID) | SUBCUTANEOUS | 11 refills | Status: DC

## 2019-07-22 MED ORDER — PROCHLORPERAZINE 25 MG RE SUPP: 12.5000 mg | Freq: Four times a day (QID) | RECTAL | Status: DC | PRN

## 2019-07-22 MED ORDER — BUPROPION HCL 75 MG PO TABS
75.0000 mg | ORAL_TABLET | Freq: Two times a day (BID) | ORAL | Status: DC
  Administered 2019-07-23 – 2019-08-11 (×39): 75 mg via ORAL
  Filled 2019-07-22 (×41): qty 1

## 2019-07-22 MED ORDER — TICAGRELOR 90 MG PO TABS: 90.0000 mg | ORAL_TABLET | Freq: Two times a day (BID) | ORAL | Status: DC

## 2019-07-22 MED ORDER — PROCHLORPERAZINE EDISYLATE 10 MG/2ML IJ SOLN: 5.0000 mg | Freq: Four times a day (QID) | INTRAMUSCULAR | Status: DC | PRN

## 2019-07-22 MED ORDER — LIRAGLUTIDE 18 MG/3ML ~~LOC~~ SOPN
1.2000 mg | PEN_INJECTOR | Freq: Every day | SUBCUTANEOUS | Status: DC
  Administered 2019-07-24 – 2019-08-13 (×21): 1.2 mg via SUBCUTANEOUS
  Filled 2019-07-22 (×12): qty 3

## 2019-07-22 MED ORDER — INSULIN DETEMIR 100 UNIT/ML ~~LOC~~ SOLN
10.0000 [IU] | Freq: Every day | SUBCUTANEOUS | Status: DC
  Administered 2019-07-22 – 2019-07-23 (×2): 10 [IU] via SUBCUTANEOUS
  Filled 2019-07-22 (×3): qty 0.1

## 2019-07-22 MED ORDER — BISACODYL 10 MG RE SUPP: 10.0000 mg | Freq: Every day | RECTAL | Status: DC | PRN

## 2019-07-22 MED ORDER — ASPIRIN 81 MG PO CHEW: 81.0000 mg | CHEWABLE_TABLET | Freq: Every day | ORAL | Status: AC

## 2019-07-22 MED ORDER — ALUM & MAG HYDROXIDE-SIMETH 200-200-20 MG/5ML PO SUSP
30.0000 mL | ORAL | Status: DC | PRN
  Administered 2019-07-27 – 2019-08-12 (×9): 30 mL via ORAL
  Filled 2019-07-22 (×9): qty 30

## 2019-07-22 MED ORDER — OXYCODONE HCL 5 MG PO TABS
5.0000 mg | ORAL_TABLET | Freq: Two times a day (BID) | ORAL | Status: DC | PRN
  Administered 2019-07-22: 5 mg via ORAL
  Filled 2019-07-22: qty 1

## 2019-07-22 MED ORDER — ADULT MULTIVITAMIN W/MINERALS CH
1.0000 | ORAL_TABLET | Freq: Every day | ORAL | Status: DC
  Administered 2019-07-22 – 2019-08-13 (×23): 1 via ORAL
  Filled 2019-07-22 (×28): qty 1

## 2019-07-22 MED ORDER — INSULIN DETEMIR 100 UNIT/ML ~~LOC~~ SOLN: 10.0000 [IU] | Freq: Every day | SUBCUTANEOUS | 11 refills | Status: DC

## 2019-07-22 MED ORDER — TRAZODONE HCL 50 MG PO TABS
25.0000 mg | ORAL_TABLET | Freq: Every evening | ORAL | Status: DC | PRN
  Administered 2019-07-22 – 2019-08-01 (×9): 50 mg via ORAL
  Filled 2019-07-22 (×10): qty 1

## 2019-07-22 MED ORDER — GUAIFENESIN-DM 100-10 MG/5ML PO SYRP: 5.0000 mL | ORAL_SOLUTION | Freq: Four times a day (QID) | ORAL | Status: DC | PRN

## 2019-07-22 MED ORDER — ENOXAPARIN SODIUM 40 MG/0.4ML ~~LOC~~ SOLN
40.0000 mg | SUBCUTANEOUS | Status: DC
  Administered 2019-07-23 – 2019-08-12 (×21): 40 mg via SUBCUTANEOUS
  Filled 2019-07-22 (×22): qty 0.4

## 2019-07-22 MED ORDER — METFORMIN HCL 500 MG PO TABS: 500.0000 mg | ORAL_TABLET | Freq: Two times a day (BID) | ORAL | Status: DC

## 2019-07-22 MED ORDER — LORATADINE 10 MG PO TABS
10.0000 mg | ORAL_TABLET | Freq: Every day | ORAL | Status: DC
  Administered 2019-07-23 – 2019-08-13 (×22): 10 mg via ORAL
  Filled 2019-07-22 (×22): qty 1

## 2019-07-22 MED ORDER — MELATONIN 3 MG PO TABS: 6.0000 mg | ORAL_TABLET | Freq: Every day | ORAL | 0 refills | Status: DC

## 2019-07-22 MED ORDER — FLUTICASONE PROPIONATE 50 MCG/ACT NA SUSP
2.0000 | Freq: Every day | NASAL | Status: DC | PRN
  Filled 2019-07-22: qty 16

## 2019-07-22 MED ORDER — SENNOSIDES-DOCUSATE SODIUM 8.6-50 MG PO TABS
1.0000 | ORAL_TABLET | Freq: Once | ORAL | Status: AC
  Administered 2019-07-22: 1 via ORAL
  Filled 2019-07-22: qty 1

## 2019-07-22 MED ORDER — PNEUMOCOCCAL VAC POLYVALENT 25 MCG/0.5ML IJ INJ
0.5000 mL | INJECTION | INTRAMUSCULAR | Status: DC
  Filled 2019-07-22: qty 0.5

## 2019-07-22 MED ORDER — MUSCLE RUB 10-15 % EX CREA
TOPICAL_CREAM | Freq: Two times a day (BID) | CUTANEOUS | Status: DC | PRN
  Filled 2019-07-22: qty 85

## 2019-07-22 MED ORDER — PROCHLORPERAZINE MALEATE 5 MG PO TABS
5.0000 mg | ORAL_TABLET | Freq: Four times a day (QID) | ORAL | Status: DC | PRN
  Administered 2019-07-24 – 2019-08-01 (×2): 10 mg via ORAL
  Administered 2019-08-04: 5 mg via ORAL
  Filled 2019-07-22: qty 2
  Filled 2019-07-22: qty 1
  Filled 2019-07-22 (×2): qty 2

## 2019-07-22 MED ORDER — ORAL CARE MOUTH RINSE
15.0000 mL | Freq: Two times a day (BID) | OROMUCOSAL | Status: DC
  Administered 2019-07-23 – 2019-07-30 (×6): 15 mL via OROMUCOSAL

## 2019-07-22 MED ORDER — IPRATROPIUM-ALBUTEROL 0.5-2.5 (3) MG/3ML IN SOLN: 3.0000 mL | Freq: Four times a day (QID) | RESPIRATORY_TRACT | Status: DC | PRN

## 2019-07-22 MED ORDER — PANTOPRAZOLE SODIUM 40 MG PO TBEC
40.0000 mg | DELAYED_RELEASE_TABLET | Freq: Every day | ORAL | Status: DC
  Administered 2019-07-22 – 2019-08-12 (×22): 40 mg via ORAL
  Filled 2019-07-22 (×22): qty 1

## 2019-07-22 NOTE — H&P (Signed)
Physical Medicine and Rehabilitation Admission H&P    Chief Complaint  Patient presents with  . Stroke with functional deficits    HPI: Kathryn Coffey is a 48 year old Rh-female with history of HTN, T2DM, morbid obesity, sinus issues/allergies, ocular herpes a year ago who was admitted on 07/15/19 with reports of all night prior to admission with onset of HA and developed flaccid Left hemiparesis a couple of hours later. CT head showed acute non-hemorrhageic infarct in right basal ganglia with hyperdense R-MCA signt. CTA/P head neck showed perfusion deficit with occluded right ICA and slow flow M!. She underwent cerebral angio with revascularization of R-MCA with T1C1 revascularization and repair of R-ICA with flow diverter device. Post procedure CT  Head negative for bleed and repeat CTA head/neck showed reocclusion of R- ICA at origin with occlusion of R-ICA stent but now with patent R-MCA. 2 D echo showed normal EF with grade 3 diastolic dysfunction. She tolerated extubation 11/24 and follow up MRI brain 11/26 showed acute infarct in right basal ganglia with hemorrhagic transformation in right caudate and right lenticular nucleus extending into insula and right temporal lobe and small acute infarct in right frontal lobe and occluded R-ICA.  To continue ASA/Brillinta due to stent and Dr. Erlinda Hong recommends 30 day event monitor after discharge. Long term BP goal 130-150 range given stent occlusion.  Patient with resultant left sided weakness with left inattention affecting mobility and ADLs. CIR recommended due to functional decline.   Today she complains of right shoulder pain and says she has a history of scapular dyskinesis for which she had PT in the past. She has been given ice and Tramadol but has not felt any relief yet. She has never had a steroid injection and does have diabetes mellitus.   Yesterday she reported depression and became tearful. She takes Wellbutrin at home for depression.   She is eager to come to rehab.  Her husband at bedside is very supportive and would like to be involved in her rehabilitation care. He is taking time off of work to do so and would like to know the hours during which she will receive therapy.     Review of Systems  Constitutional: Negative for chills and fever.  HENT: Positive for sinus pain. Negative for hearing loss and tinnitus.   Eyes: Negative for blurred vision and double vision.  Respiratory: Positive for shortness of breath. Negative for cough.   Cardiovascular: Negative for chest pain and palpitations.  Gastrointestinal: Positive for constipation. Negative for heartburn and nausea.  Genitourinary: Positive for frequency and urgency.  Musculoskeletal: Positive for joint pain (bilateral meniscus tears wtih instability. ) and myalgias. Negative for falls.  Skin: Negative for rash.  Neurological: Positive for sensory change (on the left now) and headaches. Negative for dizziness and focal weakness.  Psychiatric/Behavioral: The patient does not have insomnia.      Past Medical History:  Diagnosis Date  . Abdominal wall cellulitis 2013 and 2014  . Asthma    allergic to grasses  . B12 deficiency   . Complication of anesthesia   . Costochondritis   . Diabetes mellitus without complication ([place])   . Gastric anomaly    multiple small ulcers  . GERD (gastroesophageal reflux disease)   . Herpes 06/2018   POSSIBLY IN EYE- PT TAKING VALTREX AND WILL SEE OPTHAMOLOGIST TO SEE IF VALTREX IS WORKING  . History of abnormal cervical Pap smear   . History of Clostridium difficile colitis   .  History of hiatal hernia   . History of kidney stones   . Hypertension   . IBS (irritable bowel syndrome)   . Migraine   . Morbid obesity ([place])    s/p attempted gastric banding now decompressed  . PCOS (polycystic ovarian syndrome)   . PONV (postoperative nausea and vomiting)    WITH SPINAL ONLY    Past Surgical History:  Procedure Laterality  Date  . BREAST EXCISIONAL BIOPSY Left    cyst excision - Dr. Patty Sermons  . CESAREAN SECTION    . CHOLECYSTECTOMY    . COLONOSCOPY    . EAR CYST EXCISION Right 07/04/2018   Procedure: EXCISION GLOMUS TUMOR THUMBNAIL;  Surgeon: Corky Mull, MD;  Location: [facility] ORS;  Service: Orthopedics;  Laterality: Right;  . ESOPHAGOGASTRODUODENOSCOPY    . ESOPHAGOGASTRODUODENOSCOPY (EGD) WITH PROPOFOL N/A 12/06/2015   Procedure: ESOPHAGOGASTRODUODENOSCOPY (EGD) WITH PROPOFOL;  Surgeon: Manya Silvas, MD;  Location: [facility] ENDOSCOPY;  Service: Endoscopy;  Laterality: N/A;  . IR ANGIO INTRA EXTRACRAN SEL INTERNAL CAROTID UNI L MOD SED  07/15/2019  . IR ANGIO VERTEBRAL SEL SUBCLAVIAN INNOMINATE UNI R MOD SED  07/15/2019  . IR CT HEAD LTD  07/15/2019  . IR INTRAVSC STENT CERV CAROTID W/O EMB-PROT MOD SED INC ANGIO  07/15/2019  . IR PERCUTANEOUS ART THROMBECTOMY/INFUSION INTRACRANIAL INC DIAG ANGIO  07/15/2019  . LAPAROSCOPIC GASTRIC BANDING    . LAPAROSCOPIC GASTRIC RESTRICTIVE DUODENAL PROCEDURE (DUODENAL SWITCH) N/A 01/25/2016   Procedure: LAPAROSCOPIC GASTRIC RESTRICTIVE DUODENAL PROCEDURE (DUODENAL SWITCH);  Surgeon: Ladora Daniel, MD;  Location: [facility] ORS;  Service: General;  Laterality: N/A;  . OVARY SURGERY     x2  . RADIOLOGY WITH ANESTHESIA N/A 07/15/2019   Procedure: IR WITH ANESTHESIA;  Surgeon: Luanne Bras, MD;  Location: [facility];  Service: Radiology;  Laterality: N/A;  . TONSILLECTOMY    . TUBAL LIGATION    . UMBILICAL HERNIA REPAIR N/A 01/25/2016   Procedure: LAPAROSCOPIC UMBILICAL HERNIA;  Surgeon: Ladora Daniel, MD;  Location: [facility] ORS;  Service: General;  Laterality: N/A;  . UMBILICAL HERNIA REPAIR N/A 10/20/2016   Procedure: HERNIA REPAIR UMBILICAL ADULT;  Surgeon: Leonie Green, MD;  Location: [facility] OR[facility];  Service: General;  Laterality: N/A;    Family History  Problem Relation Age of Onset  . Breast cancer Maternal Grandmother 73  . Diabetes Mother   . Diabetes Father      Social History:  Married. 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 She reports that she has never smoked. She has never used smokeless tobacco. She reports 2 shots of vodka/week. She reports that she does not use drugs.    Allergies  Allergen Reactions  . Azithromycin Other (See Comments)    Abdominal wall abcess  . Ivp Dye [Iodinated Diagnostic Agents] Hives  . Lexapro [Escitalopram] Other (See Comments)    migraine  . Vicodin [Hydrocodone-Acetaminophen] Other (See Comments)    migraine    Medications Prior to Admission  Medication Sig Dispense Refill  . albuterol (PROVENTIL HFA;VENTOLIN HFA) 108 (90 Base) MCG/ACT inhaler Inhale 2 puffs into the lungs every 6 (six) hours as needed for wheezing or shortness of breath.    Derrill Memo ON 07/23/2019] aspirin 81 MG chewable tablet Chew 1 tablet (81 mg total) by mouth daily.    Marland Kitchen buPROPion (WELLBUTRIN) 75 MG tablet Take 75 mg by mouth 2 (two) times daily.    . Calcium Carb-Cholecalciferol (CALCIUM + VITAMIN D3 PO) Take 1 tablet by mouth  daily.    . enoxaparin (LOVENOX) 40 MG/0.4ML injection Inject 0.4 mLs (40 mg total) into the skin daily. 0 mL   . fexofenadine (ALLEGRA) 180 MG tablet Take 180 mg by mouth daily as needed for allergies.     . fluticasone (FLONASE) 50 MCG/ACT nasal spray Place 2 sprays into both nostrils daily as needed for allergies or rhinitis.     Marland Kitchen insulin aspart (NOVOLOG) 100 UNIT/ML injection Inject 0-20 Units into the skin 4 (four) times daily -  with meals and at bedtime. 10 mL 11  . insulin detemir (LEVEMIR) 100 UNIT/ML injection Inject 0.1 mLs (10 Units total) into the skin at bedtime. 10 mL 11  . Melatonin 3 MG TABS Take 2 tablets (6 mg total) by mouth at bedtime.  0  . metFORMIN (GLUCOPHAGE) 500 MG tablet Take 500 mg by mouth 2 (two) times daily.  11  . Multiple Vitamins-Minerals (MULTIVITAMIN PO) Take 2 tablets by mouth daily.    . NON FORMULARY 1 Dose once a week. Wednesdays    . omeprazole (PRILOSEC) 20 MG capsule  Take 20 mg by mouth daily.    Marland Kitchen oxyCODONE (OXY IR/ROXICODONE) 5 MG immediate release tablet Take 1 tablet (5 mg total) by mouth every 6 (six) hours as needed for severe pain. 5 tablet 0  . ticagrelor (BRILINTA) 90 MG TABS tablet Take 1 tablet (90 mg total) by mouth 2 (two) times daily. 60 tablet   . traMADol (ULTRAM) 50 MG tablet Take 1 tablet (50 mg total) by mouth every 6 (six) hours as needed for moderate pain. 20 tablet 0    Drug Regimen Review  Drug regimen was reviewed and remains appropriate with no significant issues identified  Home: Home Living Family/patient expects to be discharged to:: Private residence Living Arrangements: Spouse/significant other Available Help at Discharge: Family, Available PRN/intermittently, Available 24 hours/day, Other (Comment) Type of Home: House Home Access: Stairs to enter [employer] of Steps: 2-4 Entrance Stairs-Rails: None Home Layout: One level Bathroom Shower/Tub: Chiropodist: Standard Home Equipment: Crutches  Lives With: Spouse   Functional History: Prior Function Level of Independence: Independent  Functional Status:  Mobility: Bed Mobility Overal bed mobility: Needs Assistance Bed Mobility: Supine to Sit Rolling: Mod assist, +2 for physical assistance, +2 for safety/equipment Sidelying to sit: Mod assist, +2 for physical assistance, +2 for safety/equipment Supine to sit: Min guard, HOB elevated Sit to supine: Max assist, +2 for physical assistance General bed mobility comments: not assessed, patient received in recliner and returned to recliner Transfers Overall transfer level: Needs assistance Equipment used: Hemi-walker Transfers: Sit to/from Stand Sit to Stand: Min assist, +2 physical assistance Stand pivot transfers: Mod assist, +2 physical assistance, +2 safety/equipment General transfer comment: cues to push up from recliner, reach back for recliner Ambulation/Gait Ambulation/Gait  assistance: Min assist, +2 safety/equipment Gait Distance (Feet): 75 Feet Assistive device: Hemi-walker Gait Pattern/deviations: Step-to pattern, Decreased dorsiflexion - left, Steppage General Gait Details: patient slightly impulsive, requires cues to stop and regroup when out of sequence. Increased fall risk Gait velocity: decreased Gait velocity interpretation: <1.8 ft/sec, indicate of risk for recurrent falls  ADL: ADL Overall ADL's : Needs assistance/impaired Eating/Feeding: Set up, Sitting Eating/Feeding Details (indicate cue type and reason): pt reports difficulty opening containers during breakfast, ate before OT arrival Grooming: Moderate assistance, Standing, Wash/dry hands, Wash/dry face, Brushing hair Grooming Details (indicate cue type and reason): completed while standing at the sink;initiated incorporating LUE into handwashing Upper Body Bathing: Moderate assistance,  Sitting Lower Body Bathing: Maximal assistance, Total assistance, Sitting/lateral leans, Sit to/from stand Upper Body Dressing : Moderate assistance, Sitting Lower Body Dressing: Maximal assistance, Sitting/lateral leans, Sit to/from stand, +2 for physical assistance, +2 for safety/equipment Lower Body Dressing Details (indicate cue type and reason): assistance to don/doff socks Toilet Transfer: Moderate assistance, +2 for physical assistance, Stand-pivot, Ambulation, BSC Toilet Transfer Details (indicate cue type and reason): pt with urinary incontinence in standing, required modA+2 for safety for stand pivot toward R to [place] Toileting- Clothing Manipulation and Hygiene: Moderate assistance, Sitting/lateral lean Toileting - Clothing Manipulation Details (indicate cue type and reason): pt completed pericare with modA for safety Functional mobility during ADLs: Moderate assistance, +2 for physical assistance, +2 for safety/equipment, Cueing for safety, Cueing for sequencing General ADL Comments: ambulated from EOB  to sink with modA+2 at [facility] level:pt completed ADL while standing at sink level, required L knee block and vc for weight shifting and attending to L side of environment;  Cognition: Cognition Overall Cognitive Status: Impaired/Different from baseline Arousal/Alertness: Awake/alert Orientation Level: Oriented X4 Attention: Selective Selective Attention: Impaired Selective Attention Impairment: Verbal complex, Functional complex Memory: Appears intact Awareness: Appears intact Problem Solving: Appears intact Behaviors: Impulsive Safety/Judgment: Appears intact Cognition Arousal/Alertness: Awake/alert Behavior During Therapy: WFL for tasks assessed/performed Overall Cognitive Status: Impaired/Different from baseline Area of Impairment: Following commands, Awareness, Safety/judgement Memory: Decreased short-term memory Following Commands: Follows one step commands with increased time Safety/Judgement: Decreased awareness of safety, Decreased awareness of deficits Awareness: Emergent Problem Solving: Requires verbal cues, Difficulty sequencing General Comments: Pt demonstrates impulsivity and decreased awareness of deficits, required frequent vc for safety awareness, safe hand placement, and attending to the left side of enviornment;pt required increased time for processing;would attend to left side of environment with vc;pt teary eyed when discussing deficits Difficult to assess due to: Level of arousal(Pt was lethargic at first, but continued to wake up )  Blood pressure 129/83, pulse 77, temperature 98.2 F (36.8 C), temperature source Oral, resp. rate 18, height 5\' 5"  (1.651 m), weight 124 kg, SpO2 97 %.   Physical Exam  Gen: no distress, normal appearing HEENT: oral mucosa pink and moist, NCAT Cardio: Reg rate Chest: normal effort, normal rate of breathing Abd: soft, non-distended Ext: no edema Skin: intact Neuro: AOx3. Left facial droop. CN 2-12 otherwise intact.   Musculoskeletal: 5/5 strength on right side. LUE: 1/5 in EF and finger flexion. 0/5 in EE and WF. LLE: 3/5 throughout. Sensation intact.  Psych: pleasant, depressed mood  Results for orders placed or performed during the hospital encounter of 07/15/19 (from the past 48 hour(s))  Glucose, capillary     Status: Abnormal   Collection Time: 07/20/19  9:19 PM  Result Value Ref Range   Glucose-Capillary 137 (H) 70 - 99 mg/dL  CBC     Status: Abnormal   Collection Time: 07/21/19  3:42 AM  Result Value Ref Range   WBC 10.7 (H) 4.0 - 10.5 K/uL   RBC 3.72 (L) 3.87 - 5.11 MIL/uL   Hemoglobin 8.8 (L) 12.0 - 15.0 g/dL    Comment: Reticulocyte Hemoglobin testing may be clinically indicated, consider ordering this additional test [id number]    HCT 28.8 (L) 36.0 - 46.0 %   MCV 77.4 (L) 80.0 - 100.0 fL   MCH 23.7 (L) 26.0 - 34.0 pg   MCHC 30.6 30.0 - 36.0 g/dL   RDW 17.3 (H) 11.5 - 15.5 %   Platelets 427 (H) 150 - 400 K/uL   nRBC  0.0 0.0 - 0.2 %    Comment: Performed at [facility], [place] [address] [place], [place] [id number]  Basic metabolic panel     Status: Abnormal   Collection Time: 07/21/19  3:42 AM  Result Value Ref Range   Sodium 137 135 - 145 mmol/L   Potassium 3.6 3.5 - 5.1 mmol/L   Chloride 101 98 - 111 mmol/L   CO2 25 22 - 32 mmol/L   Glucose, Bld 153 (H) 70 - 99 mg/dL   BUN 14 6 - 20 mg/dL   Creatinine, Ser 0.60 0.44 - 1.00 mg/dL   Calcium 8.6 (L) 8.9 - 10.3 mg/dL   GFR calc non Af Amer >60 >60 mL/min   GFR calc Af Amer >60 >60 mL/min   Anion gap 11 5 - 15    Comment: Performed at [facility], [place] [address], [place], [place] [address]  Glucose, capillary     Status: Abnormal   Collection Time: 07/21/19  6:03 AM  Result Value Ref Range   Glucose-Capillary 116 (H) 70 - 99 mg/dL  Glucose, capillary     Status: Abnormal   Collection Time: 07/21/19 11:42 AM  Result Value Ref Range   Glucose-Capillary 149 (H) 70 - 99 mg/dL   Comment 1 Notify RN    Comment 2  Document in Chart   Glucose, capillary     Status: Abnormal   Collection Time: 07/21/19  4:26 PM  Result Value Ref Range   Glucose-Capillary 142 (H) 70 - 99 mg/dL   Comment 1 Notify RN    Comment 2 Document in Chart   Glucose, capillary     Status: Abnormal   Collection Time: 07/21/19  9:25 PM  Result Value Ref Range   Glucose-Capillary 130 (H) 70 - 99 mg/dL  Basic metabolic panel     Status: Abnormal   Collection Time: 07/22/19  4:08 AM  Result Value Ref Range   Sodium 139 135 - 145 mmol/L   Potassium 3.5 3.5 - 5.1 mmol/L   Chloride 105 98 - 111 mmol/L   CO2 26 22 - 32 mmol/L   Glucose, Bld 145 (H) 70 - 99 mg/dL   BUN 15 6 - 20 mg/dL   Creatinine, Ser 0.65 0.44 - 1.00 mg/dL   Calcium 8.8 (L) 8.9 - 10.3 mg/dL   GFR calc non Af Amer >60 >60 mL/min   GFR calc Af Amer >60 >60 mL/min   Anion gap 8 5 - 15    Comment: Performed at [facility], [place] [address] [place], [place] [address]  CBC     Status: Abnormal   Collection Time: 07/22/19  4:08 AM  Result Value Ref Range   WBC 12.0 (H) 4.0 - 10.5 K/uL   RBC 3.81 (L) 3.87 - 5.11 MIL/uL   Hemoglobin 8.9 (L) 12.0 - 15.0 g/dL   HCT 29.7 (L) 36.0 - 46.0 %   MCV 78.0 (L) 80.0 - 100.0 fL   MCH 23.4 (L) 26.0 - 34.0 pg   MCHC 30.0 30.0 - 36.0 g/dL   RDW 17.7 (H) 11.5 - 15.5 %   Platelets 465 (H) 150 - 400 K/uL   nRBC 0.0 0.0 - 0.2 %    Comment: Performed at [facility], [place] [address], [place], [place] [address]  Iron and TIBC     Status: Abnormal   Collection Time: 07/22/19  4:08 AM  Result Value Ref Range   Iron 27 (L)  28 - 170 ug/dL   TIBC 482 (H) 250 - 450 ug/dL   Saturation Ratios 6 (L) 10.4 - 31.8 %   UIBC 455 ug/dL    Comment: Performed at [facility], [place] [address] [place], [place] [address]  Ferritin     Status: Abnormal   Collection Time: 07/22/19  4:08 AM  Result Value Ref Range   Ferritin 9 (L) 11 - 307 ng/mL    Comment: Performed at [facility], [place] [address] [place], [place] [address]   Glucose, capillary     Status: Abnormal   Collection Time: 07/22/19  6:37 AM  Result Value Ref Range   Glucose-Capillary 118 (H) 70 - 99 mg/dL  Glucose, capillary     Status: Abnormal   Collection Time: 07/22/19 11:57 AM  Result Value Ref Range   Glucose-Capillary 176 (H) 70 - 99 mg/dL   Comment 1 Notify RN    Comment 2 Document in Chart   Glucose, capillary     Status: Abnormal   Collection Time: 07/22/19  4:38 PM  Result Value Ref Range   Glucose-Capillary 122 (H) 70 - 99 mg/dL   Comment 1 Notify RN    Comment 2 Document in Chart    No results found.     Medical Problem List and Plan: 1.  Impaired mobility and strength secondary to R MCA infarct  -patient may shower  -ELOS/Goals: Mod I PT, MinA OT, I in SLP 2.  Antithrombotics: -DVT/anticoagulation:  Pharmaceutical: Lovenox  -antiplatelet therapy: ASA/Brillinta 3. Chronic R>L knee pain/Pain Management: will add Sportscreme prn. May need right knee sleeve for support.   Chronic right shoulder scapular dyskinesis--likely worsened by increased use due to flaccid left extremity. Could potentially benefit from a steroid injection. She is diabetic so would have to monitor BG levels.  4. Mood: LCSW to follow for evaluation and support.   -antipsychotic agents: N/A  Reports pre-morbid and current depression. I advised that depression is very common post-stroke and we can help her both with therapy and medication. She was on Wellbutrin at home and I would recommend restarting. She may also benefit from neuropsych eval.  5. Neuropsych: This patient is capable of making decisions on her own behalf. 6. Skin/Wound Care: Routine pressure relief measures.  7. Fluids/Electrolytes/Nutrition: Monitor I/O. Check lytes in am.  8. Chronic HA: Takes goody powders/Motrin/Ultram. Continue tylenol prn. May add low dose topamax if it does not improve.  9. T2DM: Hgb A1c-8.0.  Monitor diet "sometimes.   Resume home dose metformin and discontinue  Levemir once Victoza available (pharmacy to order or patient able to bring in).   10. H/o depression: Uses Wellbutrin bid with adderal for attention/focus.  11. H/o band failure/gastric sleeve: Continue multivitamin. Appetite has been good.  12. Headaches/Sinus pressure: Felt to be due to allergies. Resume Flonase and allegra to help manage symptoms. Resume allergy injections after discharge.   Bary Leriche, PA-C 07/22/2019   I have personally performed a face to face diagnostic evaluation, including, but not limited to relevant history and physical exam findings, of this patient and developed relevant assessment and plan.  Additionally, I have reviewed and concur with the physician assistant's documentation above.  The patient's status has not changed. The original post admission physician evaluation remains appropriate, and any changes from the pre-admission screening or documentation from the acute chart are noted above.   Leeroy Cha, MD

## 2019-07-22 NOTE — TOC Transition Note (Signed)
Transition of Care [facility]) - CM/SW Discharge Note   Patient Details  Name: Kathryn Coffey MRN: [id number] Date of Birth: 22-Dec-1970  [facility] CM/SW Contact:  Pollie Friar, RN Phone Number: 07/22/2019, 4:35 PM   Clinical Narrative:    Pt is discharging to CIR today. CM signing off.    Final next level of care: IP Rehab Facility Barriers to Discharge: No Barriers Identified   Patient Goals and CMS Choice        Discharge Placement                       Discharge Plan and Services                                     Social Determinants of Health (SDOH) Interventions     Readmission Risk Interventions No flowsheet data found.

## 2019-07-22 NOTE — Progress Notes (Signed)
Patient admitted on the unit around 6 pm, she is A&O x4 family at bed side, she is oriented about the unit, call bell within reach, toileted, assessment done vitals stable.

## 2019-07-22 NOTE — Progress Notes (Signed)
Charlett Blake, MD  Physician  Physical Medicine and Rehabilitation  Consult Note  Signed  Date of Service:  07/18/2019 10:50 AM      Related encounter: ED to Hosp-Admission (Current) from 07/15/2019 in [facility]      Signed      Expand All Collapse All            Physical Medicine and Rehabilitation Consult     Reason for Consult: stroke with functional deficits.  Referring Physician: Dr. Leonie Man     HPI: Kathryn Coffey is a 48 y.o. female with history of HTN, T2MD, morbid obesity, possible ocular herpes,  who was admitted on 07/15/19 with fall night prior to admission with reports of HA and then went on to develop flaccid left hemiparesis a couple of hours later. CT head showed acute non-hemorrhagic infarct in right basal ganglia with hyperdense R-MCA sign concerning for thrombus. CTA/P head/neck showed large mismatch with penumbra R-MCA, occluded right internal carotid artery with slow flow M1. She underwent cerebral angio with revascularization of R-MCA with T1C1 3 revascularization and repair of R-ICA proximal occlusive dissection with flow diverter device.   Post procedure CT head negative for bleed and repeat CTA head/neck showed reocclusion of R-ICA ar origin and occlusion of R-ICA stent now with patent R-MCA.  Follow up MRI brain 11/26 showed acute right basal ganglia infarct with hemorrhagic transformation in right caudate and right lenticular nucleus, small right frontal lobe infarct and infarct extending into insula and right temporal lobe. Dr. Leonie Man felt that stroke thromboembolic in setting of large vessel disease and patient on ASA/Brillinta due to stent.  2 D echo showed EF 60-65% with grade III diastolic dysfunction. She tolerated extubation 11/26 and remains NPO pending swallow evaluation.  Patient with resultant right gaze preference, left facial weakness and left hemiparesis affecting mobility and ADLs. CIR recommended due to functional deficits.         ROS          Past Medical History:  Diagnosis Date   Abdominal wall cellulitis 2013 and 2014   Asthma      allergic to grasses   B12 deficiency     Complication of anesthesia     Costochondritis     Diabetes mellitus without complication (HCC)     Gastric anomaly      multiple small ulcers   GERD (gastroesophageal reflux disease)     Herpes 06/2018    POSSIBLY IN EYE- PT TAKING VALTREX AND WILL SEE OPTHAMOLOGIST TO SEE IF VALTREX IS WORKING   History of abnormal cervical Pap smear     History of Clostridium difficile colitis     History of hiatal hernia     History of kidney stones     Hypertension     IBS (irritable bowel syndrome)     Migraine     Morbid obesity ([place])      s/p attempted gastric banding now decompressed   PCOS (polycystic ovarian syndrome)     PONV (postoperative nausea and vomiting)      WITH SPINAL ONLY      Past Surgical History:  Procedure Laterality Date   BREAST EXCISIONAL BIOPSY Left      cyst excision - Dr. Patty Sermons   CESAREAN SECTION       CHOLECYSTECTOMY       COLONOSCOPY       EAR CYST EXCISION Right 07/04/2018    Procedure: EXCISION GLOMUS TUMOR THUMBNAIL;  Surgeon: Corky Mull, MD;  Location: [facility] ORS;  Service: Orthopedics;  Laterality: Right;   ESOPHAGOGASTRODUODENOSCOPY       ESOPHAGOGASTRODUODENOSCOPY (EGD) WITH PROPOFOL N/A 12/06/2015    Procedure: ESOPHAGOGASTRODUODENOSCOPY (EGD) WITH PROPOFOL;  Surgeon: Manya Silvas, MD;  Location: [facility];  Service: Endoscopy;  Laterality: N/A;   IR PERCUTANEOUS ART THROMBECTOMY/INFUSION INTRACRANIAL INC DIAG ANGIO   07/15/2019   LAPAROSCOPIC GASTRIC BANDING       LAPAROSCOPIC GASTRIC RESTRICTIVE DUODENAL PROCEDURE (DUODENAL SWITCH) N/A 01/25/2016    Procedure: LAPAROSCOPIC GASTRIC RESTRICTIVE DUODENAL PROCEDURE (DUODENAL SWITCH);  Surgeon: Ladora Daniel, MD;  Location: [facility];  Service: General;  Laterality: N/A;   OVARY SURGERY        x2    RADIOLOGY WITH ANESTHESIA N/A 07/15/2019    Procedure: IR WITH ANESTHESIA;  Surgeon: Luanne Bras, MD;  Location: [facility];  Service: Radiology;  Laterality: N/A;   TONSILLECTOMY       TUBAL LIGATION       UMBILICAL HERNIA REPAIR N/A 01/25/2016    Procedure: LAPAROSCOPIC UMBILICAL HERNIA;  Surgeon: Ladora Daniel, MD;  Location: [facility];  Service: General;  Laterality: N/A;   UMBILICAL HERNIA REPAIR N/A 10/20/2016    Procedure: HERNIA REPAIR UMBILICAL ADULT;  Surgeon: Leonie Green, MD;  Location: [facility];  Service: General;  Laterality: N/A;      Family History  Problem Relation Age of Onset   Breast cancer Maternal Grandmother 72   Diabetes Mother     Diabetes Father        Social History:  reports that she has never smoked. She has never used smokeless tobacco. She reports current alcohol use. She reports that she does not use drugs.           Allergies  Allergen Reactions   Azithromycin Other (See Comments)      Abdominal wall abcess   Ivp Dye [Iodinated Diagnostic Agents] Hives   Lexapro [Escitalopram] Other (See Comments)      migraine   Vicodin [Hydrocodone-Acetaminophen] Other (See Comments)      migraine            Medications Prior to Admission  Medication Sig Dispense Refill   albuterol (PROVENTIL HFA;VENTOLIN HFA) 108 (90 Base) MCG/ACT inhaler Inhale 2 puffs into the lungs every 6 (six) hours as needed for wheezing or shortness of breath.       amphetamine-dextroamphetamine (ADDERALL) 10 MG tablet Take 10 mg by mouth See admin instructions. Take 10 mg by mouth daily Monday through Friday.       buPROPion (WELLBUTRIN) 75 MG tablet Take 75 mg by mouth 2 (two) times daily.       Calcium Carb-Cholecalciferol (CALCIUM + VITAMIN D3 PO) Take 1 tablet by mouth daily.       fexofenadine (ALLEGRA) 180 MG tablet Take 180 mg by mouth daily as needed for allergies.        fluticasone (FLONASE) 50 MCG/ACT nasal spray Place 2 sprays into both nostrils  daily as needed for allergies or rhinitis.        liraglutide (VICTOZA) 18 MG/3ML SOPN Inject 1.2 mg into the skin daily.       losartan-hydrochlorothiazide (HYZAAR) 100-25 MG tablet Take 1 tablet by mouth every morning.        metFORMIN (GLUCOPHAGE) 500 MG tablet Take 500 mg by mouth 2 (two) times daily.   11   Multiple Vitamins-Minerals (MULTIVITAMIN PO) Take 2 tablets by mouth daily.  NON FORMULARY 1 Dose once a week. Wednesdays       omeprazole (PRILOSEC) 20 MG capsule Take 20 mg by mouth daily.       PREDNISONE PO Take 1 tablet by mouth as needed (headache).       traMADol (ULTRAM) 50 MG tablet Take 1 tablet (50 mg total) by mouth every 6 (six) hours as needed for moderate pain. 20 tablet 0   triamterene-hydrochlorothiazide (DYAZIDE) 37.5-25 MG capsule Take 1 capsule by mouth every morning.           Home: Home Living Family/patient expects to be discharged to:: Private residence Living Arrangements: Spouse/significant other Available Help at Discharge: Family, Available PRN/intermittently, Available 24 hours/day, Other (Comment)(husband feels his employer will give him the time ) Type of Home: House Home Access: Stairs to enter [employer] of Steps: 2-4 Entrance Stairs-Rails: None(on the most used stairs) Home Layout: One level Bathroom Shower/Tub: Chiropodist: Standard Home Equipment: Crutches  Functional History: Prior Function Level of Independence: Independent Functional Status:  Mobility: Bed Mobility Overal bed mobility: Needs Assistance Bed Mobility: Rolling, Sidelying to Sit, Sit to Supine Rolling: Total assist Sidelying to sit: Max assist Sit to supine: Total assist General bed mobility comments: 2 person not available, but necessary Transfers General transfer comment: deferred today due to just back from CT Ambulation/Gait General Gait Details: NT   ADL:   Cognition: Cognition Overall Cognitive Status: (NT  formally, answered questions appropriately) Orientation Level: Oriented X4 Cognition Arousal/Alertness: Lethargic, Awake/alert Behavior During Therapy: WFL for tasks assessed/performed, Flat affect Overall Cognitive Status: (NT formally, answered questions appropriately)     Blood pressure 136/84, pulse 66, temperature 97.9 F (36.6 C), temperature source Oral, resp. rate 16, height 5\' 5"  (1.651 m), weight 124.4 kg, SpO2 100 %. Physical Exam   Lab Results Last 24 Hours       Results for orders placed or performed during the hospital encounter of 07/15/19 (from the past 24 hour(s))  Glucose, capillary     Status: Abnormal    Collection Time: 07/17/19 12:00 PM  Result Value Ref Range    Glucose-Capillary 130 (H) 70 - 99 mg/dL  Glucose, capillary     Status: Abnormal    Collection Time: 07/17/19  3:46 PM  Result Value Ref Range    Glucose-Capillary 153 (H) 70 - 99 mg/dL    Comment 1 Notify RN      Comment 2 Document in Chart    Glucose, capillary     Status: Abnormal    Collection Time: 07/17/19  7:25 PM  Result Value Ref Range    Glucose-Capillary 174 (H) 70 - 99 mg/dL  Glucose, capillary     Status: Abnormal    Collection Time: 07/17/19 11:09 PM  Result Value Ref Range    Glucose-Capillary 136 (H) 70 - 99 mg/dL  Glucose, capillary     Status: Abnormal    Collection Time: 07/18/19  3:47 AM  Result Value Ref Range    Glucose-Capillary 132 (H) 70 - 99 mg/dL  CBC     Status: Abnormal    Collection Time: 07/18/19  5:59 AM  Result Value Ref Range    WBC 10.3 4.0 - 10.5 K/uL    RBC 3.72 (L) 3.87 - 5.11 MIL/uL    Hemoglobin 8.8 (L) 12.0 - 15.0 g/dL    HCT 28.4 (L) 36.0 - 46.0 %    MCV 76.3 (L) 80.0 - 100.0 fL    MCH 23.7 (L) 26.0 -  34.0 pg    MCHC 31.0 30.0 - 36.0 g/dL    RDW 17.5 (H) 11.5 - 15.5 %    Platelets 337 150 - 400 K/uL    nRBC 0.0 0.0 - 0.2 %  Basic metabolic panel     Status: Abnormal    Collection Time: 07/18/19  5:59 AM  Result Value Ref Range    Sodium 140  135 - 145 mmol/L    Potassium 3.6 3.5 - 5.1 mmol/L    Chloride 106 98 - 111 mmol/L    CO2 26 22 - 32 mmol/L    Glucose, Bld 151 (H) 70 - 99 mg/dL    BUN 13 6 - 20 mg/dL    Creatinine, Ser 0.68 0.44 - 1.00 mg/dL    Calcium 8.5 (L) 8.9 - 10.3 mg/dL    GFR calc non Af Amer >60 >60 mL/min    GFR calc Af Amer >60 >60 mL/min    Anion gap 8 5 - 15  Glucose, capillary     Status: Abnormal    Collection Time: 07/18/19  8:46 AM  Result Value Ref Range    Glucose-Capillary 176 (H) 70 - 99 mg/dL      Imaging Results (Last 48 hours)  Ct Angio Head W Or Wo Contrast   Result Date: 07/16/2019 CLINICAL DATA:  Stroke. Endovascular revascularization on the right 07/15/2019 EXAM: CT ANGIOGRAPHY HEAD AND NECK CT PERFUSION BRAIN TECHNIQUE: Multidetector CT imaging of the head and neck was performed using the standard protocol during bolus administration of intravenous contrast. Multiplanar CT image reconstructions and MIPs were obtained to evaluate the vascular anatomy. Carotid stenosis measurements (when applicable) are obtained utilizing NASCET criteria, using the distal internal carotid diameter as the denominator. Multiphase CT imaging of the brain was performed following IV bolus contrast injection. Subsequent parametric perfusion maps were calculated using RAPID software. CONTRAST:  161mL OMNIPAQUE IOHEXOL 350 MG/ML SOLN COMPARISON:  CT a head and neck 07/15/2019 FINDINGS: CTA NECK FINDINGS Aortic arch: Bovine branching pattern of the arch. Aortic arch and proximal great vessels appear normal. Right carotid system: Right common carotid artery normal. Occlusion of the proximal right internal carotid artery. Above the occluded segment, there is a stent which is occluded. There is reconstitution of the supraclinoid internal carotid artery on the right. Right external carotid artery is patent. Left carotid system: Left carotid widely patent without stenosis or irregularity. Vertebral arteries: Both vertebral  arteries patent to the basilar. Left vertebral artery is dominant. Skeleton: No acute skeletal abnormality. Other neck: Patient is intubated. NG tube also in place. No soft tissue mass or edema in the neck. Upper chest: Mild atelectasis or scarring in the left upper lobe. No acute abnormality. Review of the MIP images confirms the above findings CTA HEAD FINDINGS Anterior circulation: Right internal carotid artery is occluded and reconstitutes in the supraclinoid segment. Right M1 segment widely patent. Right M2 segments patent. Previously noted thrombus in the right M1 and M2 segments on the prior CTA yesterday no longer present. Anterior cerebral arteries patent bilaterally. Left MCA patent without significant stenosis. Posterior circulation: Both vertebral arteries patent to the basilar. Basilar widely patent. Superior cerebellar and posterior cerebral arteries patent bilaterally. Venous sinuses: Patent Anatomic variants: None Review of the MIP images confirms the above findings CT Brain Perfusion Findings: CBF (<30%) Volume: 62mL Perfusion (Tmax>6.0s) volume: 13mL Mismatch Volume: 87mL Infarction Location:None IMPRESSION: 1. CT perfusion negative for acute infarct or ischemia. Interval normalization of infarct in the right basal ganglia  with ischemia in the right MCA territory on CT perfusion yesterday. 2. Reocclusion of right internal carotid artery near the origin. Right internal carotid artery stent is occluded. Right MCA now widely patent. 3. Left carotid and both vertebral arteries widely patent. Electronically Signed   By: Franchot Gallo M.D.   On: 07/16/2019 11:27    Ct Head Wo Contrast   Result Date: 07/16/2019 CLINICAL DATA:  Stroke post revascularization EXAM: CT HEAD WITHOUT CONTRAST TECHNIQUE: Contiguous axial images were obtained from the base of the skull through the vertex without intravenous contrast. COMPARISON:  July 15, 2019 FINDINGS: Brain: There is better delineation of hypoattenuation  and loss of gray-white differentiation involving the right basal ganglia, posterior insula, and superior right temporal lobe. No acute intracranial hemorrhage. Mild mass effect on the right frontal horn. Vascular: No new abnormality. Skull: Unremarkable. Sinuses/Orbits: Mild mucosal thickening.  Orbits are unremarkable. Other: Mastoid air cells are clear. IMPRESSION: Evolving recent infarction involving the right basal ganglia and adjacent white matter, posterior insula, and superior right temporal lobe. No acute intracranial hemorrhage or significant mass effect. Electronically Signed   By: Macy Mis M.D.   On: 07/16/2019 15:14    Ct Angio Neck W Or Wo Contrast   Result Date: 07/16/2019 CLINICAL DATA:  Stroke. Endovascular revascularization on the right 07/15/2019 EXAM: CT ANGIOGRAPHY HEAD AND NECK CT PERFUSION BRAIN TECHNIQUE: Multidetector CT imaging of the head and neck was performed using the standard protocol during bolus administration of intravenous contrast. Multiplanar CT image reconstructions and MIPs were obtained to evaluate the vascular anatomy. Carotid stenosis measurements (when applicable) are obtained utilizing NASCET criteria, using the distal internal carotid diameter as the denominator. Multiphase CT imaging of the brain was performed following IV bolus contrast injection. Subsequent parametric perfusion maps were calculated using RAPID software. CONTRAST:  147mL OMNIPAQUE IOHEXOL 350 MG/ML SOLN COMPARISON:  CT a head and neck 07/15/2019 FINDINGS: CTA NECK FINDINGS Aortic arch: Bovine branching pattern of the arch. Aortic arch and proximal great vessels appear normal. Right carotid system: Right common carotid artery normal. Occlusion of the proximal right internal carotid artery. Above the occluded segment, there is a stent which is occluded. There is reconstitution of the supraclinoid internal carotid artery on the right. Right external carotid artery is patent. Left carotid system:  Left carotid widely patent without stenosis or irregularity. Vertebral arteries: Both vertebral arteries patent to the basilar. Left vertebral artery is dominant. Skeleton: No acute skeletal abnormality. Other neck: Patient is intubated. NG tube also in place. No soft tissue mass or edema in the neck. Upper chest: Mild atelectasis or scarring in the left upper lobe. No acute abnormality. Review of the MIP images confirms the above findings CTA HEAD FINDINGS Anterior circulation: Right internal carotid artery is occluded and reconstitutes in the supraclinoid segment. Right M1 segment widely patent. Right M2 segments patent. Previously noted thrombus in the right M1 and M2 segments on the prior CTA yesterday no longer present. Anterior cerebral arteries patent bilaterally. Left MCA patent without significant stenosis. Posterior circulation: Both vertebral arteries patent to the basilar. Basilar widely patent. Superior cerebellar and posterior cerebral arteries patent bilaterally. Venous sinuses: Patent Anatomic variants: None Review of the MIP images confirms the above findings CT Brain Perfusion Findings: CBF (<30%) Volume: 76mL Perfusion (Tmax>6.0s) volume: 50mL Mismatch Volume: 44mL Infarction Location:None IMPRESSION: 1. CT perfusion negative for acute infarct or ischemia. Interval normalization of infarct in the right basal ganglia with ischemia in the right MCA territory on CT  perfusion yesterday. 2. Reocclusion of right internal carotid artery near the origin. Right internal carotid artery stent is occluded. Right MCA now widely patent. 3. Left carotid and both vertebral arteries widely patent. Electronically Signed   By: Franchot Gallo M.D.   On: 07/16/2019 11:27    Mr Brain Wo Contrast   Result Date: 07/17/2019 CLINICAL DATA:  Stroke.  Stroke intervention 07/15/2019 EXAM: MRI HEAD WITHOUT CONTRAST TECHNIQUE: Multiplanar, multiecho pulse sequences of the brain and surrounding structures were obtained without  intravenous contrast. COMPARISON:  CT head 07/16/2019 FINDINGS: Brain: Acute infarct in the right basal ganglia involving the lenticular nuclei, and the caudate nucleus. Anterior limb internal capsule also involved. Acute infarct in the insula, right superior temporal lobe. Small amount of acute infarct in the right frontal lobe. Hemorrhagic transformation with mild amount of hemorrhage in the right caudate and right lenticular nuclei. Ventricle size normal. No midline shift. Mild chronic microvascular ischemic changes in the white matter. Vascular: Loss of flow void in the right internal carotid artery compatible with occlusion as noted on CTA. Otherwise normal flow voids. Skull and upper cervical spine: Negative Sinuses/Orbits: Mild mucosal edema paranasal sinuses. Negative orbit Other: None IMPRESSION: Acute infarct in the right basal ganglia with hemorrhagic transformation in the right caudate and right lenticular nucleus. Acute infarct also extends into the insula and right temporal lobe. Small acute infarct in the right frontal lobe. Occluded right internal carotid artery. Electronically Signed   By: Franchot Gallo M.D.   On: 07/17/2019 10:32    Ct Cerebral Perfusion W Contrast   Result Date: 07/16/2019 CLINICAL DATA:  Stroke. Endovascular revascularization on the right 07/15/2019 EXAM: CT ANGIOGRAPHY HEAD AND NECK CT PERFUSION BRAIN TECHNIQUE: Multidetector CT imaging of the head and neck was performed using the standard protocol during bolus administration of intravenous contrast. Multiplanar CT image reconstructions and MIPs were obtained to evaluate the vascular anatomy. Carotid stenosis measurements (when applicable) are obtained utilizing NASCET criteria, using the distal internal carotid diameter as the denominator. Multiphase CT imaging of the brain was performed following IV bolus contrast injection. Subsequent parametric perfusion maps were calculated using RAPID software. CONTRAST:  128mL  OMNIPAQUE IOHEXOL 350 MG/ML SOLN COMPARISON:  CT a head and neck 07/15/2019 FINDINGS: CTA NECK FINDINGS Aortic arch: Bovine branching pattern of the arch. Aortic arch and proximal great vessels appear normal. Right carotid system: Right common carotid artery normal. Occlusion of the proximal right internal carotid artery. Above the occluded segment, there is a stent which is occluded. There is reconstitution of the supraclinoid internal carotid artery on the right. Right external carotid artery is patent. Left carotid system: Left carotid widely patent without stenosis or irregularity. Vertebral arteries: Both vertebral arteries patent to the basilar. Left vertebral artery is dominant. Skeleton: No acute skeletal abnormality. Other neck: Patient is intubated. NG tube also in place. No soft tissue mass or edema in the neck. Upper chest: Mild atelectasis or scarring in the left upper lobe. No acute abnormality. Review of the MIP images confirms the above findings CTA HEAD FINDINGS Anterior circulation: Right internal carotid artery is occluded and reconstitutes in the supraclinoid segment. Right M1 segment widely patent. Right M2 segments patent. Previously noted thrombus in the right M1 and M2 segments on the prior CTA yesterday no longer present. Anterior cerebral arteries patent bilaterally. Left MCA patent without significant stenosis. Posterior circulation: Both vertebral arteries patent to the basilar. Basilar widely patent. Superior cerebellar and posterior cerebral arteries patent bilaterally. Venous sinuses: Patent  Anatomic variants: None Review of the MIP images confirms the above findings CT Brain Perfusion Findings: CBF (<30%) Volume: 22mL Perfusion (Tmax>6.0s) volume: 63mL Mismatch Volume: 1mL Infarction Location:None IMPRESSION: 1. CT perfusion negative for acute infarct or ischemia. Interval normalization of infarct in the right basal ganglia with ischemia in the right MCA territory on CT perfusion  yesterday. 2. Reocclusion of right internal carotid artery near the origin. Right internal carotid artery stent is occluded. Right MCA now widely patent. 3. Left carotid and both vertebral arteries widely patent. Electronically Signed   By: Franchot Gallo M.D.   On: 07/16/2019 11:27        Assessment/Plan: Diagnosis: Right basal ganglia infarct with left hemiparesis 1. Does the need for close, 24 hr/day medical supervision in concert with the patient's rehab needs make it unreasonable for this patient to be served in a less intensive setting? Yes 2. Co-Morbidities requiring supervision/potential complications: Morbid obesity, type 2 diabetes, hypertension, left facial weakness 3. Due to bladder management, bowel management, safety, skin/wound care, disease management, medication administration, pain management and patient education, does the patient require 24 hr/day rehab nursing? Yes 4. Does the patient require coordinated care of a physician, rehab nurse, therapy disciplines of PT, OT, speech, rec therapy to address physical and functional deficits in the context of the above medical diagnosis(es)? Yes Addressing deficits in the following areas: balance, endurance, locomotion, strength, transferring, bowel/bladder control, bathing, dressing, feeding, grooming, toileting, psychosocial support and Oral facial weakness 5. Can the patient actively participate in an intensive therapy program of at least 3 hrs of therapy per day at least 5 days per week? Yes 6. The potential for patient to make measurable gains while on inpatient rehab is excellent 7. Anticipated functional outcomes upon discharge from inpatient rehab are modified independent and supervision  with PT, modified independent and supervision with OT, independent with SLP. 8. Estimated rehab length of stay to reach the above functional goals is: 10 to 14 days 9. Anticipated discharge destination: Home 10. Overall Rehab/Functional Prognosis:  excellent   RECOMMENDATIONS: This patient's condition is appropriate for continued rehabilitative care in the following setting: CIR Patient has agreed to participate in recommended program. Yes Note that insurance prior authorization may be required for reimbursement for recommended care.   Comment: Husband is planning to install walk-in shower   Charlett Blake M.D. [facility] FAAPM&R (Sports Med, Neuromuscular Med) Diplomate Am Board of Electrodiagnostic Med         Flora Lipps 07/18/2019        Revision History Date/Time User Provider Type Action  07/21/2019 11:24 AM Kirsteins, Luanna Salk, MD Physician Sign  07/18/2019 11:08 AM Love, Ivan Anchors, PA-C Physician Assistant Share   View Details Report     Routing History

## 2019-07-22 NOTE — Progress Notes (Signed)
Kathryn Ribas, MD  Physician  Physical Medicine and Rehabilitation  PMR Pre-admission  Signed  Date of Service:  07/18/2019 5:30 PM      Related encounter: ED to Hosp-Admission (Current) from 07/15/2019 in [facility]      Signed         PMR Admission Coordinator Pre-Admission Assessment  Patient: Kathryn Coffey is an 48 y.o., female MRN: [id number] DOB: 1971-02-11 Height: 5' 5"  (165.1 cm) Weight: 124.4 kg  Insurance Information HMO: yes    PPO:      PCP:      IPA:      80/20:      OTHER:  PRIMARY: [employer]      Policy#: [id number]      Subscriber: Patient CM Name: Urban Gibson      Phone#: [phone]     Fax#: [phone] Pre-Cert#: "temporary auth: [id number]"      Employer:  Josem Kaufmann provided by Elpidio Anis (p) [phone] for CIR approval. Pt is approved for 4 days with clinical updates due on 12/4 to Tammy Rogers (p): [phone] (f): [phone]. Per Juliann Pulse, once those updated clinicals are sent we will receive permanent auth # via letter form.  Benefits:  Phone #: [phone]     Name:  Eff. Date: 08/21/2018 - 08/21/2019     Deduct: does not have ($0)      Out of Pocket Max: $2,500 ($322.00 met)      Life Max: NA CIR: $750/admission co-pay, then 100% coverage and 0% co-insurance if approved      SNF: 80% coverage, 20% co-insurance; limited to 120 days/cal yr and pre-cert requried Outpatient: no limitations, notification required     Co-Pay: $30/visit  Home Health: 80% coverage, 20% co-insurance, pre-cert required   DME: 80% coverage, 20% co-insurance, $500 co-pay required if DME rental is for longer than 2 months or DME purchase exceeds $500 dollars    Providers:  SECONDARY: None      Policy#:       Subscriber:  CM Name:       Phone#:      Fax#:  Pre-Cert#:       Employer:  Benefits:  Phone #:      Name:  Eff. Date:      Deduct:       Out of Pocket Max:       Life Max: CIR:       SNF:  Outpatient:      Co-Pay:  Home  Health:       Co-Pay:  DME:      Co-Pay:   Medicaid Application Date:       Case Manager:  Disability Application Date:       Case Worker:   The Data Collection Information Summary for patients in Inpatient Rehabilitation Facilities with attached Privacy Act [place] Records was provided and verbally reviewed with: N/A  Emergency Contact Information         Contact Information    Name Relation Home Work Mobile   Kathryn Coffey [phone]      Current Medical History  Patient Admitting Diagnosis: Right Basal Ganglia/posterior insular/temporal lobe infarct with hemorrhagic transformation and R frontal lobe infarct s/p tenecteplase and IR with revascularization and stent.   History of Present Illness: Kathryn Coffey is a 8 year oldRh-female with history of HTN, T2DM, morbid obesity,sinus issues/allergies,ocular herpesa year agowho was admitted on 07/15/19 with reports of all  night prior to admission with onset of HA and developed flaccid Left hemiparesis a couple of hours later. CT head showed acute non-hemorrhageic infarct in right basal ganglia with hyperdense R-MCA signt. CTA/P head neck showed perfusion deficit with occluded right ICA and slow flow M!. She underwent cerebral angio with revascularization of R-MCA with T1C1 revascularization and repair of R-ICA with flow diverter device. Post procedure CT Head negative for bleed and repeat CTA head/neck showed reocclusion of R- ICA at origin with occlusion of R-ICA stent but now with patent R-MCA. 2 D echo showed normal EF with grade 3 diastolic dysfunction. She tolerated extubation 11/24 and follow up MRI brain 11/26 showed acute infarct in right basal ganglia with hemorrhagic transformation in right caudate and right lenticular nucleus extending into insula and right temporal lobe and small acute infarct in right frontal lobe and occluded R-ICA. To continue ASA/Brillinta due to  stent and Dr. Erlinda Hong recommends 30 day event monitor after discharge. Long term BP goal 130-150 range given stent occlusion. During admission, pt has complained about right shoulder pain with reported history of scapular dyskinesis with ice and Tramadol being utilized currently. Patient with resultant left sided weakness with left inattention affecting mobility and ADLs. CIR recommended due to functional decline.Pt is to admit to CIR on 07/22/2019.    Complete NIHSS TOTAL: 10  Patient's medical record from [facility] has been reviewed by the rehabilitation admission coordinator and physician.  Past Medical History      Past Medical History:  Diagnosis Date   Abdominal wall cellulitis 2013 and 2014   Asthma    allergic to grasses   B12 deficiency    Complication of anesthesia    Costochondritis    Diabetes mellitus without complication (HCC)    Gastric anomaly    multiple small ulcers   GERD (gastroesophageal reflux disease)    Herpes 06/2018   POSSIBLY IN EYE- PT TAKING VALTREX AND WILL SEE OPTHAMOLOGIST TO SEE IF VALTREX IS WORKING   History of abnormal cervical Pap smear    History of Clostridium difficile colitis    History of hiatal hernia    History of kidney stones    Hypertension    IBS (irritable bowel syndrome)    Migraine    Morbid obesity (HCC)    s/p attempted gastric banding now decompressed   PCOS (polycystic ovarian syndrome)    PONV (postoperative nausea and vomiting)    WITH SPINAL ONLY    Family History   family history includes Breast cancer (age of onset: 41) in her maternal grandmother; Diabetes in her father and mother.  Prior Rehab/Hospitalizations Has the patient had prior rehab or hospitalizations prior to admission? No  Has the patient had major surgery during 100 days prior to admission? Yes             Current Medications  Current Facility-Administered Medications:     acetaminophen (TYLENOL) tablet 650 mg, 650 mg, Oral, Q4H PRN, 650 mg at 07/21/19 2209 **OR** acetaminophen (TYLENOL) 160 MG/5ML solution 650 mg, 650 mg, Per Tube, Q4H PRN **OR** acetaminophen (TYLENOL) suppository 650 mg, 650 mg, Rectal, Q4H PRN, Garvin Fila, MD   [DISCONTINUED] aspirin chewable tablet 81 mg, 81 mg, Oral, Daily **OR** aspirin chewable tablet 81 mg, 81 mg, Oral, Daily, Leonie Man, Pramod S, MD, 81 mg at 07/22/19 0902   chlorhexidine (PERIDEX) 0.12 % solution 15 mL, 15 mL, Mouth Rinse, BID, Garvin Fila, MD, 15 mL at 07/21/19 2211  Chlorhexidine Gluconate Cloth 2 % PADS 6 each, 6 each, Topical, Daily, Garvin Fila, MD, 6 each at 07/22/19 1008   enoxaparin (LOVENOX) injection 40 mg, 40 mg, Subcutaneous, Q24H, Rosalin Hawking, MD, 40 mg at 07/21/19 1704   insulin aspart (novoLOG) injection 0-20 Units, 0-20 Units, Subcutaneous, TID WC & HS, Rosalin Hawking, MD, 4 Units at 07/22/19 1334   insulin detemir (LEVEMIR) injection 10 Units, 10 Units, Subcutaneous, QHS, Garvin Fila, MD, 10 Units at 07/21/19 2211   ipratropium-albuterol (DUONEB) 0.5-2.5 (3) MG/3ML nebulizer solution 3 mL, 3 mL, Nebulization, Q6H PRN, Garvin Fila, MD   MEDLINE mouth rinse, 15 mL, Mouth Rinse, q12n4p, Leonie Man, Pramod S, MD, 15 mL at 07/22/19 1234   Melatonin TABS 6 mg, 6 mg, Oral, QHS, Rosalin Hawking, MD, 6 mg at 07/21/19 2210   ondansetron (ZOFRAN) injection 4 mg, 4 mg, Intravenous, Q6H PRN, Garvin Fila, MD, 4 mg at 07/20/19 1354   oxyCODONE (Oxy IR/ROXICODONE) immediate release tablet 5 mg, 5 mg, Oral, Q6H PRN, Rosalin Hawking, MD, 5 mg at 07/22/19 1510   pantoprazole (PROTONIX) EC tablet 40 mg, 40 mg, Oral, QHS, Sethi, Pramod S, MD, 40 mg at 07/21/19 2211   senna-docusate (Senokot-S) tablet 1 tablet, 1 tablet, Oral, Once, Garvin Fila, MD   ticagrelor (BRILINTA) tablet 90 mg, 90 mg, Oral, BID, 90 mg at 07/22/19 0902 **OR** ticagrelor (BRILINTA) tablet 90 mg, 90 mg, Per Tube, BID, Garvin Fila,  MD, 90 mg at 07/16/19 1335   valACYclovir (VALTREX) tablet 1,000 mg, 1,000 mg, Oral, TID, Garvin Fila, MD, 1,000 mg at 07/20/19 1828  Patients Current Diet:     Diet Order                  Diet heart healthy/carb modified Room service appropriate? Yes; Fluid consistency: Thin  Diet effective now               Precautions / Restrictions Precautions Precautions: Fall Precaution Comments: left hemiparesis Restrictions Weight Bearing Restrictions: No   Has the patient had 2 or more falls or a fall with injury in the past year? 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 drove PTA and was Independent  Prior Functional Level Self Care: Did the patient need help bathing, dressing, using the toilet or eating? Independent  Indoor Mobility: Did the patient need assistance with walking from room to room (with or without device)? Independent  Stairs: Did the patient need assistance with internal or external stairs (with or without device)? Independent  Functional Cognition: Did the patient need help planning regular tasks such as shopping or remembering to take medications? Independent  Home Assistive Devices / Equipment Home Equipment: Crutches  Prior Device Use: Indicate devices/aids used by the patient prior to current illness, exacerbation or injury? None of the above  Current Functional Level Cognition  Arousal/Alertness: Awake/alert Overall Cognitive Status: Impaired/Different from baseline Difficult to assess due to: Level of arousal(Pt was lethargic at first, but continued to wake up ) Orientation Level: Oriented X4 Following Commands: Follows one step commands with increased time Safety/Judgement: Decreased awareness of safety, Decreased awareness of deficits General Comments: Pt demonstrates impulsivity and decreased awareness of deficits, required frequent vc for  safety awareness, safe hand placement, and attending to the left side of enviornment;pt required increased time for processing;would attend to left side of environment with vc Attention: Selective Selective Attention: Impaired Selective  Attention Impairment: Verbal complex, Functional complex Memory: Appears intact Awareness: Appears intact Problem Solving: Appears intact Behaviors: Impulsive Safety/Judgment: Appears intact    Extremity Assessment (includes Sensation/Coordination)  Upper Extremity Assessment: Generalized weakness, LUE deficits/detail RUE Deficits / Details: AROM, WFLs; strong LUE Deficits / Details: trace movement in digits;0 elbow flexion/extension;frequently hits her hand when attempting to place it, reports she feels it but it doesn't hurt, able to feel temperature of water and light/deep pressure intact LUE Sensation: decreased proprioception LUE Coordination: decreased fine motor, decreased gross motor  Lower Extremity Assessment: Defer to PT evaluation RLE Deficits / Details: WFL, but with proximal weaknesses/hip flexion LLE Deficits / Details: pt reports feeling she is rolling her ankle during ambulation  LLE Coordination: decreased fine motor, decreased gross motor    ADLs  Overall ADL's : Needs assistance/impaired Eating/Feeding: Set up, Sitting Eating/Feeding Details (indicate cue type and reason): pt reports difficulty opening containers during breakfast, ate before OT arrival Grooming: Moderate assistance, Standing, Wash/dry hands, Wash/dry face, Brushing hair Grooming Details (indicate cue type and reason): completed while standing at the sink;initiated incorporating LUE into handwashing Upper Body Bathing: Moderate assistance, Sitting Lower Body Bathing: Maximal assistance, Total assistance, Sitting/lateral leans, Sit to/from stand Upper Body Dressing : Moderate assistance, Sitting Lower Body Dressing: Maximal assistance, Sitting/lateral leans, Sit  to/from stand, +2 for physical assistance, +2 for safety/equipment Lower Body Dressing Details (indicate cue type and reason): assistance to don/doff socks Toilet Transfer: Moderate assistance, +2 for physical assistance, Stand-pivot, Ambulation, BSC Toilet Transfer Details (indicate cue type and reason): pt with urinary incontinence in standing, required modA+2 for safety for stand pivot toward R to [facility] Toileting- Clothing Manipulation and Hygiene: Moderate assistance, Sitting/lateral lean Toileting - Clothing Manipulation Details (indicate cue type and reason): pt completed pericare with modA for safety Functional mobility during ADLs: Moderate assistance, +2 for physical assistance, +2 for safety/equipment, Cueing for safety, Cueing for sequencing General ADL Comments: ambulated from EOB to sink with modA+2 at [facility] level:pt completed ADL while standing at sink level, required L knee block and vc for weight shifting and attending to L side of environment;    Mobility  Overal bed mobility: Needs Assistance Bed Mobility: Supine to Sit Rolling: Mod assist, +2 for physical assistance, +2 for safety/equipment Sidelying to sit: Mod assist, +2 for physical assistance, +2 for safety/equipment Supine to sit: Min guard, HOB elevated Sit to supine: Max assist, +2 for physical assistance General bed mobility comments: Pt seated in recliner min assistance to elevate trunk away from back of recliner.    Transfers  Overall transfer level: Needs assistance Equipment used: Left platform walker Transfers: Sit to/from Stand Sit to Stand: Min assist Stand pivot transfers: Mod assist, +2 physical assistance, +2 safety/equipment General transfer comment: cues to push up from recliner, reach back for recliner.  Pt unaware of L side and required cues to guard LUE when transitioning sit to stand.    Ambulation / Gait / Stairs / Wheelchair Mobility  Ambulation/Gait Ambulation/Gait assistance: Mod  assist Gait Distance (Feet): 24 Feet Assistive device: Left platform walker Gait Pattern/deviations: Step-to pattern, Decreased dorsiflexion - left, Steppage General Gait Details: patient slightly impulsive, requires cues to stop and regroup when out of sequence. Increased fall risk.  Pt pushing RW too far forward and moves quicker than her legs are able to catch up to.  L knee buckling in stance phase noted. Gait velocity: decreased Gait velocity interpretation: <1.8 ft/sec, indicate of risk for recurrent falls    Posture /  Balance Dynamic Sitting Balance Sitting balance - Comments: reliant on RUE for sitting balance Balance Overall balance assessment: Needs assistance Sitting-balance support: Feet supported Sitting balance-Leahy Scale: Fair Sitting balance - Comments: reliant on RUE for sitting balance Postural control: Posterior lean Standing balance support: Single extremity supported, During functional activity Standing balance-Leahy Scale: Fair Standing balance comment: assistance for L knee stability and weight shift assistance.    Special needs/care consideration BiPAP/CPAP : no CPM : no Continuous Drip IV : no Dialysis : no        Days : no Life Vest : no Oxygen : no Special Bed : no Trach Size : no Wound Vac (area) : no      Location : no Skin : no areas of concern                      Bowel mgmt: last BM 07/21/2019, continent Bladder mgmt: external catheter in place, incontinent Diabetic mgmt: yes Behavioral consideration : no Chemo/radiation : no   Previous Home Environment (from acute therapy documentation) Living Arrangements: Spouse/significant other  Lives With: Spouse Available Help at Discharge: Family, Available PRN/intermittently, Available 24 hours/day, Other (Comment) Type of Home: House Home Layout: One level Home Access: Stairs to enter Entrance Stairs-Rails: None Entrance Stairs-Number of Steps: 2-4 Bathroom Shower/Tub: Scientist, forensic: Standard Home Care Services: No  Discharge Living Setting Plans for Discharge Living Setting: Patient's home, Lives with (comment)(husband and two sons (<18 yo)) Type of Home at Discharge: House Discharge Home Layout: One level Discharge Home Access: Stairs to enter Entrance Stairs-Rails: None Entrance Stairs-Number of Steps: 2 Discharge Bathroom Shower/Tub: Tub/shower unit Discharge Bathroom Toilet: Standard Discharge Bathroom Accessibility: Yes How Accessible: Accessible via walker(if sideways) Does the patient have any problems obtaining your medications?: No  Social/Family/Support Systems Patient Roles: Spouse, Parent, Other (Comment)(full time employee for [employer]) Contact Information: husband: Kyung Rudd): [phone] Anticipated Caregiver: husband Anticipated Caregiver's Contact Information: see above Ability/Limitations of Caregiver: Min/Mod A Caregiver Availability: 24/7 Discharge Plan Discussed with Primary Caregiver: Yes Is Caregiver In Agreement with Plan?: Yes Does Caregiver/Family have Issues with Lodging/Transportation while Pt is in Rehab?: No  Goals/Additional Needs Patient/Family Goal for Rehab: PT/OT: Min A; SLP: Supervision  Expected length of stay: 14-18 days Cultural Considerations: [facility] Dietary Needs: heart healthy/carb modified; thin liquids Equipment Needs: TBD Pt/Family Agrees to Admission and willing to participate: Yes Program Orientation Provided & Reviewed with Pt/Caregiver Including Roles  & Responsibilities: Yes(pt and husband)  Barriers to Discharge: Home environment access/layout  Barriers to Discharge Comments: steps to enter home; husband will need to take FMLA (which he is agreeable to).   Decrease burden of Care through IP rehab admission: NA  Possible need for SNF placement upon discharge: Not anticipated; pt has great family support at DC and a husband who is able to provide physical assistance as needed.  Anticipate pt has a good prognosis for further progress through CIR program.   Patient Condition: This patient's medical and functional status has changed since the /consult dated: 07/18/2019 in which the Rehabilitation Physician determined and documented that the patient's condition is appropriate for intensive rehabilitative care in an inpatient rehabilitation facility. See "History of Present Illness" (above) for medical update. Functional changes are: improvement in overall bed mobility from Max/Total A to Mod A +2. Pt has progressed in function to be able to do gait training with Mod A for 24 feet using left platform walker, Min A to stand, and is currently  Mod/Max A for ADLs. Patient's medical and functional status update has been discussed with the Rehabilitation physician and patient remains appropriate for inpatient rehabilitation. Will admit to inpatient rehab today.  Preadmission Screen Completed By:  Raechel Ache, 07/22/2019 4:36 PM ______________________________________________________________________   Discussed status with Dr. Ranell Patrick on 07/22/2019 at 4:33PM and received approval for admission today.  Admission Coordinator:  Raechel Ache, OT, time 4:33PM/Date 07/22/2019   Assessment/Plan: Diagnosis:Right basal ganglia infarct with left hemiparesis 1. Does the need for close, 24 hr/day medical supervision in concert with the patient's rehab needs make it unreasonable for this patient to be served in a less intensive setting?Yes 2. Co-Morbidities requiring supervision/potential complications:Morbid obesity, type 2 diabetes, hypertension, left facial weakness 3. Due tobladder management, bowel management, safety, skin/wound care, disease management, medication administration, pain management and patient education, does the patient require 24 hr/day rehab nursing?Yes 4. Does the patient require coordinated care of a physician, rehab nurse, therapy disciplines ofPT, OT, speech, rec  therapyto address physical and functional deficits in the context of the above medical diagnosis(es)?Yes Addressing deficits in the following areas:balance, endurance, locomotion, strength, transferring, bowel/bladder control, bathing, dressing, feeding, grooming, toileting, psychosocial support andOral facial weakness 5. Can the patient actively participate in an intensive therapy program of at least 3 hrs of therapy per day at least 5 days per week?Yes 6. The potential for patient to make measurable gains while on inpatient rehab isexcellent 7. Anticipated functional outcomes upon discharge from inpatient rehab aremodified independent and supervisionwith PT, modified independent and supervisionwith OT, independentwith SLP. 8. Estimated rehab length of stay to reach the above functional goals is:10 to 14 days 9. Anticipated discharge destination:Home 10. Overall Rehab/Functional Prognosis:excellent  RECOMMENDATIONS: This patient's condition is appropriate for continued rehabilitative care in the following setting:CIR Patient has agreed to participate in recommended program.Yes Note that insurance prior authorization may be required for reimbursement for recommended care.  Comment:Mrs. Langenfeld would be an excellent inpatient rehabilitation candidate and would be able to tolerate 3H of daily therapy. Other issues that should be addressed in rehab are her depression and right shoulder pain.   MD Signature: Leeroy Cha, MD        Revision History Date/Time User Provider Type Action  07/22/2019 4:40 PM Ranell Patrick, Clide Deutscher, MD Physician Sign  07/22/2019 4:36 PM Raechel Ache, OT Rehab Admission Coordinator Share  07/22/2019 10:03 AM Ranell Patrick, Clide Deutscher, MD Physician Share  07/21/2019 2:22 PM Raechel Ache, [place] Rehab Admission Coordinator [facility] Details Report

## 2019-07-22 NOTE — Progress Notes (Signed)
Inpatient Rehabilitation-Admissions Coordinator   I have received medical clearance from attending service and insurance approval for admit to CIR today. I have reviewed insurance benefit letter and consent forms have been signed. Pt and her husband are wanting to pursue admit to CIR today.   RN and [facility] team notified.   Please call if questions.   Jhonnie Garner, OTR/L  Rehab Admissions Coordinator  [phone] 07/22/2019 4:56 PM

## 2019-07-22 NOTE — Progress Notes (Signed)
Physical Therapy Treatment Patient Details Name: Kathryn Coffey MRN: [id number] DOB: 1971/04/04 Today's Date: 07/22/2019    History of Present Illness pt is a 48 y/o female admitted with left hemiplegia with right basal ganglia infarct. PMHx: DM, HTN and obesity    PT Comments    Pt seated in recliner chair with reports of pain in R scapula.  She required min to moderated assistance for transfers and gt training this am.  Attempted L hemiwalker and she presents with poor safety and has trouble following cues for sequencing.  Continue to recommend aggressive therapies at CIR before returning home.     Follow Up Recommendations  [facility]IR     Equipment Recommendations       Recommendations for Other Services Rehab consult     Precautions / Restrictions Precautions Precautions: Fall Precaution Comments: left hemiparesis Restrictions Weight Bearing Restrictions: No    Mobility  Bed Mobility               General bed mobility comments: Pt seated in recliner min assistance to elevate trunk away from back of recliner.  Transfers Overall transfer level: Needs assistance Equipment used: Left platform walker Transfers: Sit to/from Stand Sit to Stand: Min assist         General transfer comment: cues to push up from recliner, reach back for recliner.  Pt unaware of L side and required cues to guard LUE when transitioning sit to stand.  Ambulation/Gait Ambulation/Gait assistance: Mod assist Gait Distance (Feet): 24 Feet Assistive device: Left platform walker Gait Pattern/deviations: Step-to pattern;Decreased dorsiflexion - left;Steppage Gait velocity: decreased   General Gait Details: patient slightly impulsive, requires cues to stop and regroup when out of sequence. Increased fall risk.  Pt pushing RW too far forward and moves quicker than her legs are able to catch up to.  L knee buckling in stance phase noted.   Stairs             Wheelchair Mobility     Modified Rankin (Stroke Patients Only) Modified Rankin (Stroke Patients Only) Pre-Morbid Rankin Score: No symptoms Modified Rankin: Moderately severe disability     Balance Overall balance assessment: Needs assistance Sitting-balance support: Feet supported Sitting balance-Leahy Scale: Fair Sitting balance - Comments: reliant on RUE for sitting balance       Standing balance comment: assistance for L knee stability and weight shift assistance.                            Cognition Arousal/Alertness: Awake/alert Behavior During Therapy: WFL for tasks assessed/performed Overall Cognitive Status: Impaired/Different from baseline Area of Impairment: Following commands;Awareness;Safety/judgement                     Memory: Decreased short-term memory Following Commands: Follows one step commands with increased time Safety/Judgement: Decreased awareness of safety;Decreased awareness of deficits Awareness: Emergent Problem Solving: Requires verbal cues;Difficulty sequencing General Comments: Pt demonstrates impulsivity and decreased awareness of deficits, required frequent vc for safety awareness, safe hand placement, and attending to the left side of enviornment;pt required increased time for processing;would attend to left side of environment with vc      Exercises      General Comments        Pertinent Vitals/Pain Pain Assessment: 0-10 Pain Score: 10-Worst pain ever Pain Location: R scapukla Pain Descriptors / Indicators: Discomfort;Guarding;Grimacing Pain Intervention(s): Monitored during session;Repositioned    Home Living  Prior Function            PT Goals (current goals can now be found in the care plan section) Acute Rehab PT Goals Patient Stated Goal: back to independence Potential to Achieve Goals: Fair Progress towards PT goals: Progressing toward goals    Frequency    Min 4X/week      PT Plan  Current plan remains appropriate    Co-evaluation              AM-PAC PT "6 Clicks" Mobility   Outcome Measure  Help needed turning from your back to your side while in a flat bed without using bedrails?: A Lot Help needed moving from lying on your back to sitting on the side of a flat bed without using bedrails?: A Lot Help needed moving to and from a bed to a chair (including a wheelchair)?: A Lot Help needed standing up from a chair using your arms (e.g., wheelchair or bedside chair)?: A Little Help needed to walk in hospital room?: A Lot Help needed climbing 3-5 steps with a railing? : A Lot 6 Click Score: 13    End of Session Equipment Utilized During Treatment: Gait belt Activity Tolerance: Patient tolerated treatment well Patient left: in chair;with family/visitor present;with call bell/phone within reach Nurse Communication: Mobility status PT Visit Diagnosis: Unsteadiness on feet (R26.81);Muscle weakness (generalized) (M62.81);Hemiplegia and hemiparesis;Other abnormalities of gait and mobility (R26.89) Hemiplegia - Right/Left: Left Hemiplegia - dominant/non-dominant: Non-dominant Hemiplegia - caused by: Cerebral infarction     Time: [id number] PT Time Calculation (min) (ACUTE ONLY): 20 min  Charges:  $Gait Training: 8-22 mins                     Erasmo Leventhal , PTA Acute Rehabilitation Services Pager [phone] Office [phone]     Gwendolen Hewlett Eli Hose 07/22/2019, 9:38 AM

## 2019-07-23 ENCOUNTER — Inpatient Hospital Stay (HOSPITAL_COMMUNITY): Payer: No Typology Code available for payment source | Admitting: Speech Pathology

## 2019-07-23 ENCOUNTER — Inpatient Hospital Stay (HOSPITAL_COMMUNITY): Payer: No Typology Code available for payment source | Admitting: Physical Therapy

## 2019-07-23 ENCOUNTER — Inpatient Hospital Stay (HOSPITAL_COMMUNITY): Payer: No Typology Code available for payment source

## 2019-07-23 DIAGNOSIS — I63511 Cerebral infarction due to unspecified occlusion or stenosis of right middle cerebral artery: Secondary | ICD-10-CM

## 2019-07-23 LAB — CBC WITH DIFFERENTIAL/PLATELET
Abs Immature Granulocytes: 0.07 10*3/uL (ref 0.00–0.07)
Basophils Absolute: 0 10*3/uL (ref 0.0–0.1)
Basophils Relative: 0 %
Eosinophils Absolute: 0.2 10*3/uL (ref 0.0–0.5)
Eosinophils Relative: 1 %
HCT: 29.5 % — ABNORMAL LOW (ref 36.0–46.0)
Hemoglobin: 9 g/dL — ABNORMAL LOW (ref 12.0–15.0)
Immature Granulocytes: 1 %
Lymphocytes Relative: 17 %
Lymphs Abs: 2 10*3/uL (ref 0.7–4.0)
MCH: 23.4 pg — ABNORMAL LOW (ref 26.0–34.0)
MCHC: 30.5 g/dL (ref 30.0–36.0)
MCV: 76.8 fL — ABNORMAL LOW (ref 80.0–100.0)
Monocytes Absolute: 0.8 10*3/uL (ref 0.1–1.0)
Monocytes Relative: 7 %
Neutro Abs: 8.8 10*3/uL — ABNORMAL HIGH (ref 1.7–7.7)
Neutrophils Relative %: 74 %
Platelets: 483 10*3/uL — ABNORMAL HIGH (ref 150–400)
RBC: 3.84 MIL/uL — ABNORMAL LOW (ref 3.87–5.11)
RDW: 17.7 % — ABNORMAL HIGH (ref 11.5–15.5)
WBC: 11.9 10*3/uL — ABNORMAL HIGH (ref 4.0–10.5)
nRBC: 0 % (ref 0.0–0.2)

## 2019-07-23 LAB — COMPREHENSIVE METABOLIC PANEL
ALT: 27 U/L (ref 0–44)
AST: 22 U/L (ref 15–41)
Albumin: 2.9 g/dL — ABNORMAL LOW (ref 3.5–5.0)
Alkaline Phosphatase: 97 U/L (ref 38–126)
Anion gap: 10 (ref 5–15)
BUN: 14 mg/dL (ref 6–20)
CO2: 24 mmol/L (ref 22–32)
Calcium: 8.7 mg/dL — ABNORMAL LOW (ref 8.9–10.3)
Chloride: 102 mmol/L (ref 98–111)
Creatinine, Ser: 0.52 mg/dL (ref 0.44–1.00)
GFR calc Af Amer: >60 mL/min (ref >60)
GFR calc non Af Amer: >60 mL/min (ref >60)
Glucose, Bld: 144 mg/dL — ABNORMAL HIGH (ref 70–99)
Potassium: 3.6 mmol/L (ref 3.5–5.1)
Sodium: 136 mmol/L (ref 135–145)
Total Bilirubin: 0.6 mg/dL (ref 0.3–1.2)
Total Protein: 6.1 g/dL — ABNORMAL LOW (ref 6.5–8.1)

## 2019-07-23 LAB — GLUCOSE, CAPILLARY
Glucose-Capillary: 136 mg/dL — ABNORMAL HIGH (ref 70–99)
Glucose-Capillary: 132 mg/dL — ABNORMAL HIGH (ref 70–99)
Glucose-Capillary: 107 mg/dL — ABNORMAL HIGH (ref 70–99)
Glucose-Capillary: 104 mg/dL — ABNORMAL HIGH (ref 70–99)

## 2019-07-23 MED ORDER — LIDOCAINE 5 % EX PTCH
1.0000 | MEDICATED_PATCH | CUTANEOUS | Status: DC
  Administered 2019-07-23 – 2019-07-26 (×4): 1 via TRANSDERMAL
  Filled 2019-07-23 (×12): qty 1

## 2019-07-23 MED ORDER — LIDOCAINE HCL (PF) 1 % IJ SOLN
2.0000 mL | Freq: Once | INTRAMUSCULAR | Status: AC
  Administered 2019-07-23: 2 mL via SUBCUTANEOUS

## 2019-07-23 MED ORDER — TRIAMCINOLONE ACETONIDE 40 MG/ML IJ SUSP
40.0000 mg | Freq: Once | INTRAMUSCULAR | Status: AC
  Administered 2019-07-23: 40 mg via INTRA_ARTICULAR
  Filled 2019-07-23: qty 1

## 2019-07-23 MED ORDER — INSULIN ASPART 100 UNIT/ML ~~LOC~~ SOLN
0.0000 [IU] | Freq: Three times a day (TID) | SUBCUTANEOUS | Status: DC
  Administered 2019-07-23 – 2019-08-13 (×5): 1 [IU] via SUBCUTANEOUS

## 2019-07-23 MED ORDER — ACETAMINOPHEN 325 MG PO TABS: 325.0000 mg | ORAL_TABLET | ORAL | Status: DC | PRN

## 2019-07-23 MED ORDER — INSULIN ASPART 100 UNIT/ML ~~LOC~~ SOLN: 0.0000 [IU] | Freq: Every day | SUBCUTANEOUS | Status: DC

## 2019-07-23 NOTE — Plan of Care (Signed)
Nutrition Education Note  RD consulted for nutrition education regarding diabetes and a heart healthy diet.  Spoke with pt and husband at bedside. Pt reports having a DM diagnosis for over 10 years. Pt reports that she limits carbohydrates, sugars, and bread. Pt also reports that she does not drink sugar-sweetened beverages.  Lab Results  Component Value Date   HGBA1C 8.0 (H) 07/16/2019   Lipid Panel     Component Value Date/Time   CHOL 129 07/16/2019 0450   TRIG 309 (H) 07/16/2019 0450   TRIG 321 (H) 07/16/2019 0450   HDL 26 (L) 07/16/2019 0450   CHOLHDL 5.0 07/16/2019 0450   VLDL 62 (H) 07/16/2019 0450   LDLCALC 41 07/16/2019 0450   RD provided "Heart Healthy, Consistent Carbohydrate Nutrition Therapy" handout from the [employer]. Discussed different food groups and their effects on blood sugar, emphasizing carbohydrate-containing foods. Provided list of carbohydrates and recommended serving sizes of common foods.  Discussed importance of controlled and consistent carbohydrate intake throughout the day. Provided examples of ways to balance meals/snacks and encouraged intake of high-fiber, whole grain complex carbohydrates. Teach back method used.  Provided examples on ways to decrease sodium and fat intake in diet. Discouraged intake of processed foods and use of salt shaker. Teach back method used.  Expect good compliance.  Body mass index is 45.49 kg/m. Pt meets criteria for obesity class III based on current BMI.  Current diet order is Dysphagia 3. No meal completions charted at this time. Labs and medications reviewed. No further nutrition interventions warranted at this time. RD contact information provided. If additional nutrition issues arise, please re-consult RD.   Gaynell Face, MS, RD, LDN Inpatient Clinical Dietitian Pager: [phone] Weekend/After Hours: [phone]

## 2019-07-23 NOTE — Plan of Care (Signed)
  Problem: Consults Goal: RH STROKE PATIENT EDUCATION Description: See Patient Education module for education specifics  Outcome: Progressing   Problem: RH BOWEL ELIMINATION Goal: RH STG MANAGE BOWEL W/MEDICATION W/ASSISTANCE Description: STG Manage Bowel with Medication with [place]. Outcome: Progressing   Problem: RH SKIN INTEGRITY Goal: RH STG MAINTAIN SKIN INTEGRITY WITH ASSISTANCE Description: STG Maintain Skin Integrity With Mod I Assistance. Outcome: Progressing   Problem: RH SAFETY Goal: RH STG ADHERE TO SAFETY PRECAUTIONS W/ASSISTANCE/DEVICE Description: STG Adhere to Safety Precautions With Mod I Assistance/Device. Outcome: Progressing Goal: RH STG DECREASED RISK OF FALL WITH ASSISTANCE Description: STG Decreased Risk of Fall With Mod I Assistance. Outcome: Progressing   Problem: RH PAIN MANAGEMENT Goal: RH STG PAIN MANAGED AT OR BELOW PT'S PAIN GOAL Outcome: Progressing

## 2019-07-23 NOTE — Progress Notes (Signed)
Inpatient Rehabilitation  Patient information reviewed and entered into eRehab system by Caili Escalera M. Marleigh Kaylor, M.A., CCC/SLP, PPS Coordinator.  Information including medical coding, functional ability and quality indicators will be reviewed and updated through discharge.    

## 2019-07-23 NOTE — Evaluation (Signed)
Occupational Therapy Assessment and Plan  Patient Details  Name: Kathryn Coffey MRN: [id number] Date of Birth: Jan 18, 1971  OT Diagnosis: abnormal posture, acute pain, cognitive deficits, disturbance of vision, flaccid hemiplegia and hemiparesis, hemiplegia affecting non-dominant side and muscle weakness (generalized) Rehab Potential: Rehab Potential (ACUTE ONLY): Good ELOS: 2.5-3 weeks   Today's Date: 07/23/2019 OT Individual Time: 6314-9702 OT Individual Time Calculation (min): 80 min     Problem List:  Patient Active Problem List   Diagnosis Date Noted  . Acute ischemic right middle cerebral artery (MCA) stroke ([place]) 07/22/2019  . Carotid artery dissection  (H[facility]) s/p stent placement 07/21/2019  . Diabetes mellitus type II, uncontrolled ([place]) 07/21/2019  . Acute blood loss anemia 07/21/2019  . Leukocytosis 07/21/2019  . Stroke (cerebrum) ([place]) - R MCA infarct s/p tenecteplase and mechanical thrombectomy w/ d/t ICA dissection  07/15/2019  . Middle cerebral artery embolism, right 07/15/2019  . Controlled type 2 diabetes mellitus without complication, without long-term current use of insulin ([place]) 05/28/2017  . Attention deficit hyperactivity disorder (ADHD), combined type 04/18/2017  . Morbid obesity ([place]) 01/25/2016  . Umbilical hernia 63/78/5885  . Hiatal hernia 12/08/2015  . History of Clostridium difficile colitis 06/19/2014  . HTN (hypertension) 12/23/2013  . PCOS (polycystic ovarian syndrome) 12/23/2013  . Bariatric surgery status 01/28/2013    Past Medical History:  Past Medical History:  Diagnosis Date  . Abdominal wall cellulitis 2013 and 2014  . Asthma    allergic to grasses  . B12 deficiency   . Complication of anesthesia   . Costochondritis   . Diabetes mellitus without complication ([place])   . Gastric anomaly    multiple small ulcers  . GERD (gastroesophageal reflux disease)   . Herpes 06/2018   POSSIBLY IN EYE- PT TAKING VALTREX AND WILL SEE OPTHAMOLOGIST TO  SEE IF VALTREX IS WORKING  . History of abnormal cervical Pap smear   . History of Clostridium difficile colitis   . History of hiatal hernia   . History of kidney stones   . Hypertension   . IBS (irritable bowel syndrome)   . Migraine   . Morbid obesity ([place])    s/p attempted gastric banding now decompressed  . PCOS (polycystic ovarian syndrome)   . PONV (postoperative nausea and vomiting)    WITH SPINAL ONLY   Past Surgical History:  Past Surgical History:  Procedure Laterality Date  . BREAST EXCISIONAL BIOPSY Left    cyst excision - Dr. Patty Sermons  . CESAREAN SECTION    . CHOLECYSTECTOMY    . COLONOSCOPY    . EAR CYST EXCISION Right 07/04/2018   Procedure: EXCISION GLOMUS TUMOR THUMBNAIL;  Surgeon: Corky Mull, MD;  Location: [facility] ORS;  Service: Orthopedics;  Laterality: Right;  . ESOPHAGOGASTRODUODENOSCOPY    . ESOPHAGOGASTRODUODENOSCOPY (EGD) WITH PROPOFOL N/A 12/06/2015   Procedure: ESOPHAGOGASTRODUODENOSCOPY (EGD) WITH PROPOFOL;  Surgeon: Manya Silvas, MD;  Location: [facility] ENDOSCOPY;  Service: Endoscopy;  Laterality: N/A;  . IR ANGIO INTRA EXTRACRAN SEL INTERNAL CAROTID UNI L MOD SED  07/15/2019  . IR ANGIO VERTEBRAL SEL SUBCLAVIAN INNOMINATE UNI R MOD SED  07/15/2019  . IR CT HEAD LTD  07/15/2019  . IR INTRAVSC STENT CERV CAROTID W/O EMB-PROT MOD SED INC ANGIO  07/15/2019  . IR PERCUTANEOUS ART THROMBECTOMY/INFUSION INTRACRANIAL INC DIAG ANGIO  07/15/2019  . LAPAROSCOPIC GASTRIC BANDING    . LAPAROSCOPIC GASTRIC RESTRICTIVE DUODENAL PROCEDURE (DUODENAL SWITCH) N/A 01/25/2016   Procedure: LAPAROSCOPIC GASTRIC RESTRICTIVE DUODENAL PROCEDURE (DUODENAL SWITCH);  Surgeon: Ladora Daniel, MD;  Location: [facility] ORS;  Service: General;  Laterality: N/A;  . OVARY SURGERY     x2  . RADIOLOGY WITH ANESTHESIA N/A 07/15/2019   Procedure: IR WITH ANESTHESIA;  Surgeon: Luanne Bras, MD;  Location: [facility];  Service: Radiology;  Laterality: N/A;  . TONSILLECTOMY    . TUBAL  LIGATION    . UMBILICAL HERNIA REPAIR N/A 01/25/2016   Procedure: LAPAROSCOPIC UMBILICAL HERNIA;  Surgeon: Ladora Daniel, MD;  Location: [facility] ORS;  Service: General;  Laterality: N/A;  . UMBILICAL HERNIA REPAIR N/A 10/20/2016   Procedure: HERNIA REPAIR UMBILICAL ADULT;  Surgeon: Leonie Green, MD;  Location: [facility] ORS;  Service: General;  Laterality: N/A;    Assessment & Plan Clinical Impression: Kathryn Coffey is a 48 year old Rh-female with history of HTN, T2DM, morbid obesity, sinus issues/allergies, ocular herpes a year ago who was admitted on 07/15/19 with reports of all night prior to admission with onset of HA and developed flaccid Left hemiparesis a couple of hours later. CT head showed acute non-hemorrhageic infarct in right basal ganglia with hyperdense R-MCA signt. CTA/P head neck showed perfusion deficit with occluded right ICA and slow flow M!. She underwent cerebral angio with revascularization of R-MCA with T1C1 revascularization and repair of R-ICA with flow diverter device. Post procedure CT  Head negative for bleed and repeat CTA head/neck showed reocclusion of R- ICA at origin with occlusion of R-ICA stent but now with patent R-MCA. 2 D echo showed normal EF with grade 3 diastolic dysfunction. She tolerated extubation 11/24 and follow up MRI brain 11/26 showed acute infarct in right basal ganglia with hemorrhagic transformation in right caudate and right lenticular nucleus extending into insula and right temporal lobe and small acute infarct in right frontal lobe and occluded R-ICA.  To continue ASA/Brillinta due to stent and Dr. Erlinda Hong recommends 30 day event monitor after discharge. Long term BP goal 130-150 range given stent occlusion.  Patient with resultant left sided weakness with left inattention affecting mobility and ADLs. CIR recommended due to functional decline.   Today she complains of right shoulder pain and says she has a history of scapular dyskinesis for which she had PT  in the past. She has been given ice and Tramadol but has not felt any relief yet. She has never had a steroid injection and does have diabetes mellitus.   Patient currently requires max with basic self-care skills secondary to muscle weakness, decreased cardiorespiratoy endurance, impaired timing and sequencing, abnormal tone, unbalanced muscle activation, ataxia, decreased coordination and decreased motor planning, decreased visual perceptual skills and decreased visual motor skills, decreased midline orientation and decreased attention to left, decreased attention, decreased awareness, decreased problem solving and decreased safety awareness and decreased sitting balance, decreased standing balance, decreased postural control, hemiplegia and decreased balance strategies.  Prior to hospitalization, patient could complete BADL/IADL/vocation with independent .  Patient will benefit from skilled intervention to decrease level of assist with basic self-care skills prior to discharge home with care partner.  Anticipate patient will require 24 hour supervision and follow up home health.  OT - End of Session Activity Tolerance: Tolerates 30+ min activity without fatigue Endurance Deficit: Yes OT Assessment Rehab Potential (ACUTE ONLY): Good OT Barriers to Discharge: Decreased caregiver support;Weight;Home environment access/layout OT Patient demonstrates impairments in the following area(s): Balance;Cognition;Endurance;Motor;Perception;Safety;Vision OT Basic ADL's Functional Problem(s): Grooming;Bathing;Dressing;Toileting OT Transfers Functional Problem(s): Toilet;Tub/Shower OT Additional Impairment(s): Fuctional Use of Upper Extremity OT Plan OT Intensity:  Minimum of 1-2 x/day, 45 to 90 minutes OT Frequency: 5 out of 7 days OT Duration/Estimated Length of Stay: 2.5-3 weeks OT Treatment/Interventions: Balance/vestibular training;Discharge planning;Functional electrical stimulation;Pain management;Self  Care/advanced ADL retraining;Therapeutic Activities;UE/LE Coordination activities;Cognitive remediation/compensation;Disease mangement/prevention;Functional mobility training;Patient/family education;Skin care/wound managment;Therapeutic Exercise;Visual/perceptual remediation/compensation;Community reintegration;DME/adaptive equipment instruction;Neuromuscular re-education;Psychosocial support;Splinting/orthotics;UE/LE Strength taining/ROM;Wheelchair propulsion/positioning OT Self Feeding Anticipated Outcome(s): S OT Basic Self-Care Anticipated Outcome(s): S OT Toileting Anticipated Outcome(s): S OT Bathroom Transfers Anticipated Outcome(s): s OT Recommendation Patient destination: Home Follow Up Recommendations: Home health OT Equipment Recommended: Tub/shower bench;3 in 1 bedside comode;To be determined   Skilled Therapeutic Intervention 1:1. Pt received in room with husband present. Pt and husband edu re OT role/purpsose, POC, CIR, ELOS and POC. Pt reporting need to toilet and powers up with MIN-MOD A for all transfers with L knee block and VC for hand placement during transfers EOB>w/c<>toilet>recliner. Pt toilets with MAX A for CM and MIN A for sitting balance during hygiene seated. Pt extremely quick moving and impulsive with mobility requiring MAX VC for safety awareness and slowing down. Pt bathes and dresses sit to stand at sink as written below. Education regaring L attention provided to husband to position self in L visual field when in room for L attention. Husband reporting earlier in day pt with urgency and he assisted pt to bathroom, but now understanding of protocols and to wait for staff to assist patient with mobilizing. dynivaision completed seated in w/c with reaction time 1.2-1.4 seconds in all quads exited session with pt seated in recliner,e xit alarm on and call light in reach  OT Evaluation Precautions/Restrictions  Precautions Precautions: Fall Precaution Comments: left  hemiparesis. L inattention Restrictions Weight Bearing Restrictions: No General Chart Reviewed: Yes Family/Caregiver Present: Yes(husband) Vital Signs Therapy Vitals Temp: 98.6 F (37 C) Temp Source: Oral Pulse Rate: 70 Resp: 20 BP: 129/70 Patient Position (if appropriate): Lying Oxygen Therapy SpO2: 95 % O2 Device: Room Air Pain Pain Assessment Pain Scale: 0-10 Pain Score: 10-Worst pain ever Pain Type: Chronic pain Pain Location: (R scapula) Pain Orientation: Right Pain Descriptors / Indicators: Sharp;Headache Pain Frequency: Constant Pain Onset: On-going Patients Stated Pain Goal: 3 Pain Intervention(s): Medication (See eMAR);Relaxation Home Living/Prior Functioning Home Living Family/patient expects to be discharged to:: Private residence Living Arrangements: Spouse/significant other Available Help at Discharge: Family, Available PRN/intermittently, Available 24 hours/day, Other (Comment) Type of Home: House Home Access: Stairs to enter [employer] of Steps: 3 Entrance Stairs-Rails: None Home Layout: One level Bathroom Shower/Tub: Chiropodist: Standard Bathroom Accessibility: No Additional Comments: Husband plans on adding rail in bath tub, at toilet and at entrance.  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 ADL ADL Grooming:  Minimal assistance(A to position LUE as stabilizer) Where Assessed-Grooming: Sitting at sink Upper Body Bathing: Minimal assistance Where Assessed-Upper Body Bathing: Sitting at [place]: Moderate assistance Where Assessed-Lower Body Bathing: Standing at sink, Sitting at sink Upper Body Dressing: Max assistance Where Assessed-Upper Body Dressing: Sitting at sink Lower Body Dressing: Maximal assistance Where Assessed-Lower Body Dressing: Sitting at sink, Standing  at sink Toileting: Minimal assistance Where Assessed-Toileting: Glass blower/designer: Moderate assistance Toilet Transfer Method: Stand pivot Toilet Transfer Equipment: Grab bars Vision Baseline Vision/History: No visual deficits Vision Assessment?: Vision impaired- to be further tested in functional context Eye Alignment: Within Functional Limits Ocular Range of Motion: Within Functional Limits Alignment/Gaze Preference: Gaze right Additional Comments: moderate L body inattention, mild L visual field inattention, decreased convergence Perception  Perception: Impaired Inattention/Neglect: Does not attend to left visual field;Does not attend to left side of body Praxis Praxis: Intact Cognition Overall Cognitive Status: Impaired/Different from baseline Arousal/Alertness: Awake/alert Orientation Level: Person;Place;Situation Person: Oriented Place: Oriented Situation: Oriented Year: 2020 Month: December Day of Week: Correct Memory: Appears intact Immediate Memory Recall: Sock;Blue;Bed Memory Recall Sock: Without Cue Memory Recall Blue: Without Cue Selective Attention: Impaired Selective Attention Impairment: Functional complex Awareness: Appears intact Problem Solving: Impaired Problem Solving Impairment: Functional complex Executive Function: Self Monitoring Self Monitoring: Impaired Self Monitoring Impairment: Verbal complex;Functional complex Safety/Judgment: Impaired Comments: left  inattention Sensation Sensation Light Touch: Appears Intact Coordination Gross Motor Movements are Fluid and Coordinated: No Fine Motor Movements are Fluid and Coordinated: No Finger Nose Finger Test: flaccid LUE Heel Shin Test: ataxic LLE Motor  Motor Motor: Hemiplegia;Ataxia Motor - Skilled Clinical Observations: Dense LUE hemiplega. ataxia in the LLE as well as hemiplegia Mobility  Bed Mobility Bed Mobility: Sit to Supine;Rolling Right;Rolling Left Rolling Right: Maximal Assistance - Patient 25-49% Rolling Left: Maximal Assistance - Patient 25-49% Sit to Supine: Maximal Assistance - Patient 25-49% Transfers Sit to Stand: Moderate Assistance - Patient 50-74%  Trunk/Postural Assessment  Cervical Assessment Cervical Assessment: Exceptions to WFL(limited L with cervical rotation) Thoracic Assessment Thoracic Assessment: Exceptions to WFL(rounded hsoudler) Lumbar Assessment Lumbar Assessment: Exceptions to [facility] Postural Control Postural Control: Deficits on evaluation Righting Reactions: delays Protective Responses: limited on the L in standing Postural Limitations: poor postural control on the L  Balance Balance Balance Assessed: Yes Static Sitting Balance Static Sitting - Level of Assistance: 5: Stand by assistance Dynamic Sitting Balance Dynamic Sitting - Level of Assistance: 4: Min assist Sitting balance - Comments: reliant on RUE for sitting balance Static Standing Balance Static Standing - Level of Assistance: 3: Mod assist Dynamic Standing Balance Dynamic Standing - Level of Assistance: 2: Max assist;3: Mod assist Extremity/Trunk Assessment RUE Assessment RUE Assessment: Within Functional Limits LUE Assessment LUE Assessment: Exceptions to [facility] General Strength Comments: flaccid hemiparesis LUE Body System: Neuro Brunstrum levels for arm and hand: Arm;Hand Brunstrum level for arm: Stage I Presynergy Brunstrum level for hand: Stage I Flaccidity     Refer to  Care Plan for Long Term Goals  Recommendations for other services: Therapeutic Recreation  Pet therapy and Stress management   Discharge Criteria: Patient will be discharged from OT if patient refuses treatment 3 consecutive times without medical reason, if treatment goals not met, if there is a change in medical status, if patient makes no progress towards goals or if patient is discharged from hospital.  The above assessment, treatment plan, treatment alternatives and goals were discussed and mutually agreed upon: by patient and by family  Tonny Branch 07/23/2019, 4:29 PM

## 2019-07-23 NOTE — Evaluation (Signed)
Physical Therapy Assessment and Plan  Patient Details  Name: Kathryn Coffey MRN: [id number] Date of Birth: 1971-02-28  PT Diagnosis: Abnormal posture, Abnormality of gait, Ataxia, Ataxic gait and Hemiplegia non-dominant Rehab Potential: Good ELOS: 17-21 days   Today's Date: 07/23/2019 PT Individual Time: 1030-1130 PT Individual Time Calculation (min): 60 min    Problem List:  Patient Active Problem List   Diagnosis Date Noted  . Acute ischemic right middle cerebral artery (MCA) stroke ([place]) 07/22/2019  . Carotid artery dissection  (H[facility]) s/p stent placement 07/21/2019  . Diabetes mellitus type II, uncontrolled ([place]) 07/21/2019  . Acute blood loss anemia 07/21/2019  . Leukocytosis 07/21/2019  . Stroke (cerebrum) ([place]) - R MCA infarct s/p tenecteplase and mechanical thrombectomy w/ d/t ICA dissection  07/15/2019  . Middle cerebral artery embolism, right 07/15/2019  . Controlled type 2 diabetes mellitus without complication, without long-term current use of insulin ([place]) 05/28/2017  . Attention deficit hyperactivity disorder (ADHD), combined type 04/18/2017  . Morbid obesity ([place]) 01/25/2016  . Umbilical hernia 00/86/7619  . Hiatal hernia 12/08/2015  . History of Clostridium difficile colitis 06/19/2014  . HTN (hypertension) 12/23/2013  . PCOS (polycystic ovarian syndrome) 12/23/2013  . Bariatric surgery status 01/28/2013    Past Medical History:  Past Medical History:  Diagnosis Date  . Abdominal wall cellulitis 2013 and 2014  . Asthma    allergic to grasses  . B12 deficiency   . Complication of anesthesia   . Costochondritis   . Diabetes mellitus without complication ([place])   . Gastric anomaly    multiple small ulcers  . GERD (gastroesophageal reflux disease)   . Herpes 06/2018   POSSIBLY IN EYE- PT TAKING VALTREX AND WILL SEE OPTHAMOLOGIST TO SEE IF VALTREX IS WORKING  . History of abnormal cervical Pap smear   . History of Clostridium difficile colitis   . History of  hiatal hernia   . History of kidney stones   . Hypertension   . IBS (irritable bowel syndrome)   . Migraine   . Morbid obesity ([place])    s/p attempted gastric banding now decompressed  . PCOS (polycystic ovarian syndrome)   . PONV (postoperative nausea and vomiting)    WITH SPINAL ONLY   Past Surgical History:  Past Surgical History:  Procedure Laterality Date  . BREAST EXCISIONAL BIOPSY Left    cyst excision - Dr. Patty Sermons  . CESAREAN SECTION    . CHOLECYSTECTOMY    . COLONOSCOPY    . EAR CYST EXCISION Right 07/04/2018   Procedure: EXCISION GLOMUS TUMOR THUMBNAIL;  Surgeon: Corky Mull, MD;  Location: [facility] ORS;  Service: Orthopedics;  Laterality: Right;  . ESOPHAGOGASTRODUODENOSCOPY    . ESOPHAGOGASTRODUODENOSCOPY (EGD) WITH PROPOFOL N/A 12/06/2015   Procedure: ESOPHAGOGASTRODUODENOSCOPY (EGD) WITH PROPOFOL;  Surgeon: Manya Silvas, MD;  Location: [facility] ENDOSCOPY;  Service: Endoscopy;  Laterality: N/A;  . IR ANGIO INTRA EXTRACRAN SEL INTERNAL CAROTID UNI L MOD SED  07/15/2019  . IR ANGIO VERTEBRAL SEL SUBCLAVIAN INNOMINATE UNI R MOD SED  07/15/2019  . IR CT HEAD LTD  07/15/2019  . IR INTRAVSC STENT CERV CAROTID W/O EMB-PROT MOD SED INC ANGIO  07/15/2019  . IR PERCUTANEOUS ART THROMBECTOMY/INFUSION INTRACRANIAL INC DIAG ANGIO  07/15/2019  . LAPAROSCOPIC GASTRIC BANDING    . LAPAROSCOPIC GASTRIC RESTRICTIVE DUODENAL PROCEDURE (DUODENAL SWITCH) N/A 01/25/2016   Procedure: LAPAROSCOPIC GASTRIC RESTRICTIVE DUODENAL PROCEDURE (DUODENAL SWITCH);  Surgeon: Ladora Daniel, MD;  Location: [facility] ORS;  Service: General;  Laterality:  N/A;  . OVARY SURGERY     x2  . RADIOLOGY WITH ANESTHESIA N/A 07/15/2019   Procedure: IR WITH ANESTHESIA;  Surgeon: Luanne Bras, MD;  Location: [facility];  Service: Radiology;  Laterality: N/A;  . TONSILLECTOMY    . TUBAL LIGATION    . UMBILICAL HERNIA REPAIR N/A 01/25/2016   Procedure: LAPAROSCOPIC UMBILICAL HERNIA;  Surgeon: Ladora Daniel, MD;   Location: [facility] ORS;  Service: General;  Laterality: N/A;  . UMBILICAL HERNIA REPAIR N/A 10/20/2016   Procedure: HERNIA REPAIR UMBILICAL ADULT;  Surgeon: Leonie Green, MD;  Location: [facility] ORS;  Service: General;  Laterality: N/A;    Assessment & Plan Clinical Impression: Patient is a 48 year old Rh-female with history of HTN, T2DM, morbid obesity, sinus issues/allergies, ocular herpes a year ago who was admitted on 07/15/19 with reports of all night prior to admission with onset of HA and developed flaccid Left hemiparesis a couple of hours later. CT head showed acute non-hemorrhageic infarct in right basal ganglia with hyperdense R-MCA signt. CTA/P head neck showed perfusion deficit with occluded right ICA and slow flow M!. She underwent cerebral angio with revascularization of R-MCA with T1C1 revascularization and repair of R-ICA with flow diverter device. Post procedure CT  Head negative for bleed and repeat CTA head/neck showed reocclusion of R- ICA at origin with occlusion of R-ICA stent but now with patent R-MCA. 2 D echo showed normal EF with grade 3 diastolic dysfunction. She tolerated extubation 11/24 and follow up MRI brain 11/26 showed acute infarct in right basal ganglia with hemorrhagic transformation in right caudate and right lenticular nucleus extending into insula and right temporal lobe and small acute infarct in right frontal lobe and occluded R-ICA.  To continue ASA/Brillinta due to stent and Dr. Erlinda Hong recommends 30 day event monitor after discharge. Long term BP goal 130-150 range given stent occlusion.  Patient with resultant left sided weakness with left inattention affecting mobility and ADLs  Patient transferred to CIR on 07/22/2019 .   Patient currently requires max with mobility secondary to muscle weakness and muscle paralysis, decreased cardiorespiratoy endurance, impaired timing and sequencing, abnormal tone, unbalanced muscle activation, ataxia and decreased coordination,  decreased visual perceptual skills, decreased attention to left and decreased sitting balance, decreased standing balance, decreased postural control, hemiplegia and decreased balance strategies.  Prior to hospitalization, patient was independent  with mobility and lived with   in a House home.  Home access is 3Stairs to enter.  Patient will benefit from skilled PT intervention to maximize safe functional mobility, minimize fall risk and decrease caregiver burden for planned discharge home with 24 hour supervision.  Anticipate patient will benefit from follow up [place] at discharge.  PT - End of Session Activity Tolerance: Tolerates 10 - 20 min activity with multiple rests Endurance Deficit: Yes PT Assessment Rehab Potential (ACUTE/IP ONLY): Good PT Barriers to Discharge: Inaccessible home environment;Decreased caregiver support;Home environment access/layout PT Patient demonstrates impairments in the following area(s): Balance;Endurance;Edema;Motor;Perception;Safety;Sensory;Skin Integrity PT Transfers Functional Problem(s): Bed Mobility;Bed to Chair;Car;Furniture PT Locomotion Functional Problem(s): Ambulation;Wheelchair Mobility;Stairs PT Plan PT Frequency: 5 out of 7 days PT Duration Estimated Length of Stay: 17-21 days PT Treatment/Interventions: Ambulation/gait training;Discharge planning;Psychosocial support;Functional mobility training;Therapeutic Activities;Visual/perceptual remediation/compensation;Therapeutic Exercise;Wheelchair propulsion/positioning;Skin care/wound management;Neuromuscular re-education;Disease management/prevention;Balance/vestibular training;Cognitive remediation/compensation;DME/adaptive equipment instruction;Pain management;Splinting/orthotics;UE/LE Strength taining/ROM;Patient/family education;Functional electrical stimulation;Community reintegration;Stair training;UE/LE Coordination activities PT Transfers Anticipated Outcome(s): Supervision assist with LRAD PT  Locomotion Anticipated Outcome(s): Supervision -CGA with LRAD at ambulatory level PT Recommendation Follow Up Recommendations: Home  health PT Patient destination: Home Equipment Recommended: Rolling walker with 5" wheels;Wheelchair (measurements);Wheelchair cushion (measurements)  Skilled Therapeutic Intervention Pt received supine in bed and agreeable to PT. Supine>sit transfer with mod assist from very elevated position. Stand pivot transfer to [facility] with mod assist on the R. Pt reports incontinence of bladder. MD then present to administer injection. Stand pivot to and from toilet with mod assist, unable to have additional void. PT instructed patient in PT Evaluation and initiated treatment intervention; see below for results. PT educated patient in [place], rehab potential, rehab goals, and discharge recommendations. Gait training without AD as listed below, as well as with RW and L hand orthotic x 75f with min-mod assist for safety. Car transfer to sedan height with mod-max assist for safety and to prevent L lateral LOB. Pt unable to perform steps due ataxia and Lsided inattention. Patient returned to room and [facility] in reach and all needs met.        PT Evaluation Precautions/Restrictions Precautions Precautions: Fall Precaution Comments: left hemiparesis. L inattention Restrictions Weight Bearing Restrictions: No General   Vital Signs Pain Pain Assessment Pain Scale: 0-10 Pain Score: 8  Pain Type: Chronic pain Pain Location: Shoulder Pain Orientation: Right Pain Descriptors / Indicators: Sharp Pain Frequency: Constant Pain Onset: Gradual Patients Stated Pain Goal: 2 Pain Intervention(s): Medication (See eMAR) Home Living/Prior Functioning Home Living Available Help at Discharge: Family;Available PRN/intermittently;Available 24 hours/day;Other (Comment) Type of Home: House Home Access: Stairs to enter [employer] Steps: 3 Entrance  Stairs-Rails: None Home Layout: One level Bathroom Shower/Tub: TChiropodist Standard Bathroom Accessibility: No Additional Comments: Husband plans on adding rail in bath tub, at toilet and at entrance. Prior Function Level of Independence: Independent with basic ADLs  Able to Take Stairs?: Yes Driving: Yes Vocation: Full time employment Vocation Requirements: Works in coding for [place] Motion: Within Functional Limits Additional Comments: mild-moderate L inattention. Perception Perception: Impaired Inattention/Neglect: Does not attend to left visual field;Does not attend to left side of body Praxis Praxis: Intact  Cognition Orientation Level: Oriented X4 Problem Solving: Impaired Problem Solving Impairment: Verbal complex;Functional complex Executive Function: Self Monitoring Self Monitoring: Impaired Self Monitoring Impairment: Verbal complex;Functional complex Safety/Judgment: Impaired Comments: impulsive Sensation Sensation Light Touch: Impaired Detail Light Touch Impaired Details: Impaired LLE;Impaired LUE Additional Comments: LU worse than LE Coordination Gross Motor Movements are Fluid and Coordinated: No Fine Motor Movements are Fluid and Coordinated: No Finger Nose Finger Test: flaccid LUE Heel Shin Test: ataxic LLE Motor  Motor Motor: Hemiplegia;Ataxia Motor - Skilled Clinical Observations: Dense LUE hemiplega. ataxia in the LLE as well as hemiplegia  Mobility Bed Mobility Bed Mobility: Sit to Supine;Rolling Right;Rolling Left Rolling Right: Maximal Assistance - Patient 25-49% Rolling Left: Maximal Assistance - Patient 25-49% Sit to Supine: Maximal Assistance - Patient 25-49% Transfers Transfers: Sit to Stand;Stand Pivot Transfers Sit to Stand: Moderate Assistance - Patient 50-74% Stand Pivot Transfers: Moderate Assistance - Patient 50 - 74% Stand Pivot Transfer Details:  Visual cues/gestures for precautions/safety;Verbal cues for technique;Verbal cues for precautions/safety;Verbal cues for sequencing;Manual facilitation for weight shifting;Manual facilitation for placement Transfer (Assistive device): None Locomotion  Gait Ambulation: Yes Gait Assistance: Moderate Assistance - Patient 50-74% Gait Distance (Feet): 10 Feet Assistive device: None Gait Gait: Yes Gait Pattern: Impaired Gait Pattern: Ataxic;Left foot flat;Narrow base of support;Lateral hip instability Stairs / Additional Locomotion Stairs: No WArchitect Yes Wheelchair Assistance:  Moderate Assistance - Patient 50 - 74% Wheelchair Propulsion: Right upper extremity;Right lower extremity Wheelchair Parts Management: Needs assistance Distance: 79f  Trunk/Postural Assessment  Cervical Assessment Cervical Assessment: Exceptions to WFL(limited on the L with cervical rotation.) Thoracic Assessment Thoracic Assessment: Exceptions to WFL(rounded shoulders.) Lumbar Assessment Lumbar Assessment: Exceptions to WLew[facility] PulaskiPostural Control Postural Control: Deficits on evaluation Righting Reactions: delays Protective Responses: limited on the L in standing Postural Limitations: poor postural control on the L  Balance Balance Balance Assessed: Yes Static Sitting Balance Static Sitting - Level of Assistance: 5: Stand by assistance Dynamic Sitting Balance Dynamic Sitting - Level of Assistance: 4: Min assist Static Standing Balance Static Standing - Level of Assistance: 3: Mod assist Dynamic Standing Balance Dynamic Standing - Level of Assistance: 2: Max assist;3: Mod assist Extremity Assessment      RLE Assessment RLE Assessment: Within Functional Limits LLE Assessment LLE Assessment: Exceptions to WOp[facility] Of [facility] LLCGeneral Strength Comments: grossly 4-/5 proximal to distal with functional movement    Refer to Care Plan for Long Term Goals  Recommendations for other services:  Neuropsych and Therapeutic Recreation  Stress management and Outing/community reintegration  Discharge Criteria: Patient will be discharged from PT if patient refuses treatment 3 consecutive times without medical reason, if treatment goals not met, if there is a change in medical status, if patient makes no progress towards goals or if patient is discharged from hospital.  The above assessment, treatment plan, treatment alternatives and goals were discussed and mutually agreed upon: by patient  ALorie Phenix12/09/2018, 12:24 PM

## 2019-07-23 NOTE — Progress Notes (Signed)
New admit at shift change. incont at times, difficulty with movement in bed due to flacid left and chronic pain in right. Slept well overnight

## 2019-07-23 NOTE — Evaluation (Signed)
Speech Language Pathology Assessment and Plan  Patient Details  Name: Kathryn Coffey MRN: [id number] Date of Birth: 1970-10-30  SLP Diagnosis: Cognitive Impairments;Dysphagia  Rehab Potential: Excellent ELOS: 2.5-3 weeks    Today's Date: 07/23/2019 SLP Individual Time: 0938-1829 SLP Individual Time Calculation (min): 55 min   Problem List:  Patient Active Problem List   Diagnosis Date Noted  . Acute ischemic right middle cerebral artery (MCA) stroke ([place]) 07/22/2019  . Carotid artery dissection  (HCC) s/p stent placement 07/21/2019  . Diabetes mellitus type II, uncontrolled ([place]) 07/21/2019  . Acute blood loss anemia 07/21/2019  . Leukocytosis 07/21/2019  . Stroke (cerebrum) ([place]) - R MCA infarct s/p tenecteplase and mechanical thrombectomy w/ d/t ICA dissection  07/15/2019  . Middle cerebral artery embolism, right 07/15/2019  . Controlled type 2 diabetes mellitus without complication, without long-term current use of insulin ([place]) 05/28/2017  . Attention deficit hyperactivity disorder (ADHD), combined type 04/18/2017  . Morbid obesity ([place]) 01/25/2016  . Umbilical hernia 93/71/6967  . Hiatal hernia 12/08/2015  . History of Clostridium difficile colitis 06/19/2014  . HTN (hypertension) 12/23/2013  . PCOS (polycystic ovarian syndrome) 12/23/2013  . Bariatric surgery status 01/28/2013   Past Medical History:  Past Medical History:  Diagnosis Date  . Abdominal wall cellulitis 2013 and 2014  . Asthma    allergic to grasses  . B12 deficiency   . Complication of anesthesia   . Costochondritis   . Diabetes mellitus without complication ([place])   . Gastric anomaly    multiple small ulcers  . GERD (gastroesophageal reflux disease)   . Herpes 06/2018   POSSIBLY IN EYE- PT TAKING VALTREX AND WILL SEE OPTHAMOLOGIST TO SEE IF VALTREX IS WORKING  . History of abnormal cervical Pap smear   . History of Clostridium difficile colitis   . History of hiatal hernia   . History of  kidney stones   . Hypertension   . IBS (irritable bowel syndrome)   . Migraine   . Morbid obesity ([place])    s/p attempted gastric banding now decompressed  . PCOS (polycystic ovarian syndrome)   . PONV (postoperative nausea and vomiting)    WITH SPINAL ONLY   Past Surgical History:  Past Surgical History:  Procedure Laterality Date  . BREAST EXCISIONAL BIOPSY Left    cyst excision - Dr. Patty Sermons  . CESAREAN SECTION    . CHOLECYSTECTOMY    . COLONOSCOPY    . EAR CYST EXCISION Right 07/04/2018   Procedure: EXCISION GLOMUS TUMOR THUMBNAIL;  Surgeon: Corky Mull, MD;  Location: [facility] ORS;  Service: Orthopedics;  Laterality: Right;  . ESOPHAGOGASTRODUODENOSCOPY    . ESOPHAGOGASTRODUODENOSCOPY (EGD) WITH PROPOFOL N/A 12/06/2015   Procedure: ESOPHAGOGASTRODUODENOSCOPY (EGD) WITH PROPOFOL;  Surgeon: Manya Silvas, MD;  Location: [facility] ENDOSCOPY;  Service: Endoscopy;  Laterality: N/A;  . IR ANGIO INTRA EXTRACRAN SEL INTERNAL CAROTID UNI L MOD SED  07/15/2019  . IR ANGIO VERTEBRAL SEL SUBCLAVIAN INNOMINATE UNI R MOD SED  07/15/2019  . IR CT HEAD LTD  07/15/2019  . IR INTRAVSC STENT CERV CAROTID W/O EMB-PROT MOD SED INC ANGIO  07/15/2019  . IR PERCUTANEOUS ART THROMBECTOMY/INFUSION INTRACRANIAL INC DIAG ANGIO  07/15/2019  . LAPAROSCOPIC GASTRIC BANDING    . LAPAROSCOPIC GASTRIC RESTRICTIVE DUODENAL PROCEDURE (DUODENAL SWITCH) N/A 01/25/2016   Procedure: LAPAROSCOPIC GASTRIC RESTRICTIVE DUODENAL PROCEDURE (DUODENAL SWITCH);  Surgeon: Ladora Daniel, MD;  Location: [facility] ORS;  Service: General;  Laterality: N/A;  . OVARY SURGERY  x2  . RADIOLOGY WITH ANESTHESIA N/A 07/15/2019   Procedure: IR WITH ANESTHESIA;  Surgeon: Luanne Bras, MD;  Location: [place];  Service: Radiology;  Laterality: N/A;  . TONSILLECTOMY    . TUBAL LIGATION    . UMBILICAL HERNIA REPAIR N/A 01/25/2016   Procedure: LAPAROSCOPIC UMBILICAL HERNIA;  Surgeon: Ladora Daniel, MD;  Location: [facility] ORS;  Service: General;   Laterality: N/A;  . UMBILICAL HERNIA REPAIR N/A 10/20/2016   Procedure: HERNIA REPAIR UMBILICAL ADULT;  Surgeon: Leonie Green, MD;  Location: [facility] ORS;  Service: General;  Laterality: N/A;    Assessment / Plan / Recommendation Clinical Impression Patient is a 79 year oldRh-female with history of HTN, T2DM, morbid obesity,sinus issues/allergies,ocular herpesa year agowho was admitted on 07/15/19 with reports of all night prior to admission with onset of HA and developed flaccid Left hemiparesis a couple of hours later. CT head showed acute non-hemorrhageic infarct in right basal ganglia with hyperdense R-MCA signt. CTA/P head neck showed perfusion deficit with occluded right ICA and slow flow M!. She underwent cerebral angio with revascularization of R-MCA with T1C1 revascularization and repair of R-ICA with flow diverter device. Post procedure CT Head negative for bleed and repeat CTA head/neck showed reocclusion of R- ICA at origin with occlusion of R-ICA stent but now with patent R-MCA. 2 D echo showed normal EF with grade 3 diastolic dysfunction. She tolerated extubation 11/24 and follow up MRI brain 11/26 showed acute infarct in right basal ganglia with hemorrhagic transformation in right caudate and right lenticular nucleus extending into insula and right temporal lobe and small acute infarct in right frontal lobe and occluded R-ICA. To continue ASA/Brillinta due to stent and Dr. Erlinda Hong recommends 30 day event monitor after discharge. Long term BP goal 130-150 range given stent occlusion.During admission, pt has complained about right shoulder pain with reported history of scapular dyskinesis with ice and Tramadol being utilized currently.Patient with resultant left sided weakness with left inattention affecting mobility and ADLs. CIR recommended due to functional decline and admit on 07/22/2019.  Bedside Swallow Evaluation: Patient passed the Yale swallow screen upon addmission and was  consuming regular textures with thin liquids. However, during evaluation, patient reported difficulty swallowing characterized by intermittent "choking" and frequent biting of her lip and cheek during meals with both liquids and solids which she also reports is exacerbated by xerostomia. Patient currently demonstrates adequate lingual strength and symmetry but mild left facial weakness which results in mild residuals in the left lateral sulci that patient requires a liquid wash to clear. Mastication appeared timely, however, patient reports increased difficulty with meats, suspect due to decrease moisture. Patient' swallow trigger appeared timely and no overt s/s of aspiration were observed. Recommend patient downgrade to Dys. 3 textures to maximize safety and comfort with PO intake but remain on thin liquids. Patient may use straws but needs to utilize small, single sips and must be in an upright position. Patient verbalized understanding and agreement.   Cognitive-Linguistic Evaluation: Patient's verbal expression and auditory comprehension appeared [facility] for all tasks assessed without evidence of dysarthria. However, patient's vocal quality does appear weak with somewhat of a "shaky" quality, but patient is 100% intelligible. Patient was administered the Cognistat and scored WFL on all subtests with the exception of visual construction. Mild impairments in selective attention were noted throughout session along with a moderate left inattention which impacts her overall safety during functional tasks.  Patient would benefit from skilled SLP intervention to maximize her swallowing and  cognitive functioning prior to discharge.    Skilled Therapeutic Interventions          Administered a cognitive-linguistic evaluation and BSE, please see above for details.   SLP Assessment  Patient will need skilled Speech Lanaguage Pathology Services during CIR admission    Recommendations  SLP Diet Recommendations:  Dysphagia 3 (Mech soft);Thin Liquid Administration via: Straw Medication Administration: Whole meds with liquid Supervision: Patient able to self feed Compensations: Slow rate;Small sips/bites;Lingual sweep for clearance of pocketing;Follow solids with liquid Postural Changes and/or Swallow Maneuvers: Seated upright 90 degrees Oral Care Recommendations: Oral care BID Recommendations for Other Services: Neuropsych consult Patient destination: Home Follow up Recommendations: (TBD) Equipment Recommended: None recommended by SLP    SLP Frequency 3 to 5 out of 7 days   SLP Duration  SLP Intensity  SLP Treatment/Interventions 2.5-3 weeks  Minumum of 1-2 x/day, 30 to 90 minutes  Cognitive remediation/compensation;Dysphagia/aspiration precaution training;Internal/external aids;Environmental controls;Therapeutic Activities;Functional tasks;Patient/family education    Pain Pain Assessment Pain Scale: 0-10 Pain Type: Chronic pain Pain Location: shoulder Pain Orientation: Right Pain Frequency: Constant Pain Onset: On-going Patients Stated Pain Goal: 3 Pain Intervention(s):ice  Prior Functioning Type of Home: House Available Help at Discharge: Family;Available PRN/intermittently;Available 24 hours/day;Other (Comment) Vocation: Full time employment  SLP Evaluation Cognition Overall Cognitive Status: Impaired/Different from baseline Arousal/Alertness: Awake/alert Orientation Level: Oriented X4 Selective Attention: Impaired Selective Attention Impairment: Functional complex Memory: Appears intact Awareness: Appears intact Problem Solving: Impaired Problem Solving Impairment: Functional complex Executive Function: Self Monitoring Self Monitoring: Impaired Self Monitoring Impairment: Verbal complex;Functional complex Safety/Judgment: Impaired Comments: left inattention  Comprehension Auditory Comprehension Overall Auditory Comprehension: Appears within functional limits for  tasks assessed Visual Recognition/Discrimination Discrimination: Not tested Reading Comprehension Reading Status: Not tested Expression Expression Primary Mode of Expression: Verbal Verbal Expression Overall Verbal Expression: Appears within functional limits for tasks assessed Written Expression Dominant Hand: Right Written Expression: Not tested Oral Motor Oral Motor/Sensory Function Overall Oral Motor/Sensory Function: Mild impairment Facial ROM: Reduced left Facial Symmetry: Abnormal symmetry left Facial Strength: Reduced left Facial Sensation: Reduced left Lingual ROM: Within Functional Limits Lingual Symmetry: Within Functional Limits Lingual Strength: Within Functional Limits Velum: Within Functional Limits Mandible: Within Functional Limits Motor Speech Overall Motor Speech: Impaired Respiration: Within functional limits Phonation: (weak) Resonance: Within functional limits Articulation: Within functional limitis Intelligibility: Intelligible Motor Planning: Witnin functional limits Effective Techniques: Increased vocal intensity   Bedside Swallowing Assessment General Date of Onset: 08/14/19 Previous Swallow Assessment: N/A Diet Prior to this Study: Regular;Thin liquids Temperature Spikes Noted: No Respiratory Status: Room air History of Recent Intubation: No Behavior/Cognition: Alert;Cooperative;Pleasant mood Oral Cavity - Dentition: Adequate natural dentition Self-Feeding Abilities: Able to feed self;Needs set up Patient Positioning: Upright in bed Baseline Vocal Quality: (weak) Volitional Cough: Strong Volitional Swallow: Able to elicit  Oral Care Assessment   Ice Chips Ice chips: Not tested Thin Liquid Thin Liquid: Within functional limits Presentation: Self Fed;Straw Nectar Thick Nectar Thick Liquid: Not tested Honey Thick Honey Thick Liquid: Not tested Puree Puree: Within functional limits Solid Solid: Impaired Oral Phase Functional  Implications: Oral residue BSE Assessment Risk for Aspiration Impact on safety and function: Mild aspiration risk  Short Term Goals: Week 1: SLP Short Term Goal 1 (Week 1): Patient will demonstrate functional problem solving for complex tasks with Mod I. SLP Short Term Goal 2 (Week 1): Patient will demonstrate selective attention to a task in a mildly distracting enviornment for 20 minutes with supervision level verbal cues for redirection. SLP Short  Term Goal 3 (Week 1): Patient will attend/scan to left field of enviornment during functional tasks with Mod verbal cues. SLP Short Term Goal 4 (Week 1): Patient will consume current diet without overt s/s of aspiration and overall Mod I for use of swallowing compensatory strategies. SLP Short Term Goal 5 (Week 1): Patient will demonstrate efficient mastication with complete oral clearance with trials of regular textures without overt s/s of aspiration over 2 sessions prior to upgrade.  Refer to Care Plan for Long Term Goals  Recommendations for other services: Neuropsych  Discharge Criteria: Patient will be discharged from SLP if patient refuses treatment 3 consecutive times without medical reason, if treatment goals not met, if there is a change in medical status, if patient makes no progress towards goals or if patient is discharged from hospital.  The above assessment, treatment plan, treatment alternatives and goals were discussed and mutually agreed upon: by patient  Tyliah Schlereth 07/23/2019, 3:11 PM

## 2019-07-23 NOTE — Progress Notes (Signed)
[place] PHYSICAL MEDICINE & REHABILITATION PROGRESS NOTE   Subjective/Complaints: Kathryn Coffey continues to complain of right shoulder pain. She states that she has a history of scapular dyskinesis. She has been using ice packs for comfort.  She is very happy to be in rehab and has started her therapies. Her husband is at bedside and would like to be involved in her therapies.  Labs stable this morning.   Objective:   No results found. Recent Labs    07/22/19 0408 07/23/19 0521  WBC 12.0* 11.9*  HGB 8.9* 9.0*  HCT 29.7* 29.5*  PLT 465* 483*   Recent Labs    07/22/19 0408 07/23/19 0521  NA 139 136  K 3.5 3.6  CL 105 102  CO2 26 24  GLUCOSE 145* 144*  BUN 15 14  CREATININE 0.65 0.52  CALCIUM 8.8* 8.7*   No intake or output data in the 24 hours ending 07/23/19 1119   Physical Exam: Vital Signs Blood pressure (!) 120/94, pulse 67, temperature 98.1 F (36.7 C), resp. rate 18, height 5\' 5"  (1.651 m), weight 124 kg, SpO2 99 %. Gen: no distress, normal appearing HEENT: oral mucosa pink and moist, NCAT Cardio: Reg rate Chest: normal effort, normal rate of breathing Abd: soft, non-distended Ext: no edema Skin: intact Neuro: AOx3. Left facial droop. CN 2-12 otherwise intact.  Musculoskeletal: 5/5 strength on right side. LUE: 1/5 in EF and finger flexion. 0/5 in EE and WF. LLE: 3/5 throughout. R shoulder: FROM without pain. No tenderness over glenohumeral joint.  Trigger points present over muscles of right upper back and neck.  Sensation intact.  Psych: pleasant, mood more optimistic today.   Assessment/Plan: 1. Functional deficits secondary to acute ischemic right MCA stroke which require 3+ hours per day of interdisciplinary therapy in a comprehensive inpatient rehab setting.  Physiatrist is providing close team supervision and 24 hour management of active medical problems listed below.  Physiatrist and rehab team continue to assess barriers to discharge/monitor  patient progress toward functional and medical goals  Care Tool:  Bathing              Bathing assist       Upper Body Dressing/Undressing Upper body dressing        Upper body assist      Lower Body Dressing/Undressing Lower body dressing            Lower body assist       Toileting Toileting    Toileting assist Assist for toileting: Maximal Assistance - Patient 25 - 49%     Transfers Chair/bed transfer  Transfers assist           Locomotion Ambulation   Ambulation assist              Walk 10 feet activity   Assist           Walk 50 feet activity   Assist           Walk 150 feet activity   Assist           Walk 10 feet on uneven surface  activity   Assist           Wheelchair     Assist               Wheelchair 50 feet with 2 turns activity    Assist            Wheelchair 150 feet activity  Assist          Blood pressure (!) 120/94, pulse 67, temperature 98.1 F (36.7 C), resp. rate 18, height 5\' 5"  (1.651 m), weight 124 kg, SpO2 99 %.    Medical Problem List and Plan: 1.  Impaired mobility and strength secondary to R MCA infarct             -patient may shower             -ELOS/Goals: Mod I PT, MinA OT, I in SLP 2.  Antithrombotics: -DVT/anticoagulation:  Pharmaceutical: Lovenox             -antiplatelet therapy: ASA/Brillinta 3. Chronic R>L knee pain/Pain Management: will add Sportscreme prn. May need right knee sleeve for support.              Chronic right shoulder scapular dyskinesis--likely worsened by increased use due to flaccid left extremity. Her pain is not in the shoulder joint and she has full and painless range of motion of right shoulder. Pain is more located along the scapula and muscles of upper back. Risks and benefits explained and patient consented to injections. Two trigger point injections were performed at the sites of maximal tenderness in the right  upper back using 0.5% Bupivicaine (Lot [id number], Exp 06/23). Procedure was well tolerated. Will also provide Lidocaine patch for right shoulder. 4. Mood: LCSW to follow for evaluation and support.              -antipsychotic agents: N/A             Reports pre-morbid and current depression. I advised that depression is very common post-stroke and we can help her both with therapy and medication. She was on Wellbutrin at home and could consider restarting. She may also benefit from neuropsych eval.  5. Neuropsych: This patient is capable of making decisions on her own behalf. 6. Skin/Wound Care: Routine pressure relief measures.  7. Fluids/Electrolytes/Nutrition: Monitor I/O. Check lytes in am.  8. Chronic HA: Takes goody powders/Motrin/Ultram. Continue tylenol prn. May add low dose topamax if it does not improve.  9. T2DM: Hgb A1c-8.0.  Monitor diet.   Resume home dose metformin and discontinue Levemir once Victoza available (pharmacy to order or patient able to bring in).   10. H/o depression: Uses Wellbutrin bid with adderal for attention/focus.  11. H/o band failure/gastric sleeve: Continue multivitamin. Appetite has been good.  12. Headaches/Sinus pressure: Felt to be due to allergies. Resume Flonase and allegra to help manage symptoms. Resume allergy injections after discharge.   LOS: 1 days A FACE TO FACE EVALUATION WAS PERFORMED  Akashdeep Chuba P Kiyoko Mcguirt 07/23/2019, 11:19 AM

## 2019-07-24 ENCOUNTER — Inpatient Hospital Stay (HOSPITAL_COMMUNITY): Payer: No Typology Code available for payment source | Admitting: Physical Therapy

## 2019-07-24 ENCOUNTER — Encounter (HOSPITAL_COMMUNITY): Payer: Self-pay | Admitting: Interventional Radiology

## 2019-07-24 ENCOUNTER — Inpatient Hospital Stay (HOSPITAL_COMMUNITY): Payer: No Typology Code available for payment source | Admitting: Occupational Therapy

## 2019-07-24 ENCOUNTER — Inpatient Hospital Stay (HOSPITAL_COMMUNITY): Payer: No Typology Code available for payment source | Admitting: Speech Pathology

## 2019-07-24 DIAGNOSIS — E1169 Type 2 diabetes mellitus with other specified complication: Secondary | ICD-10-CM

## 2019-07-24 DIAGNOSIS — M7918 Myalgia, other site: Secondary | ICD-10-CM

## 2019-07-24 DIAGNOSIS — E669 Obesity, unspecified: Secondary | ICD-10-CM

## 2019-07-24 DIAGNOSIS — G8194 Hemiplegia, unspecified affecting left nondominant side: Secondary | ICD-10-CM

## 2019-07-24 DIAGNOSIS — F329 Major depressive disorder, single episode, unspecified: Secondary | ICD-10-CM

## 2019-07-24 LAB — GLUCOSE, CAPILLARY
Glucose-Capillary: 141 mg/dL — ABNORMAL HIGH (ref 70–99)
Glucose-Capillary: 87 mg/dL (ref 70–99)
Glucose-Capillary: 106 mg/dL — ABNORMAL HIGH (ref 70–99)
Glucose-Capillary: 96 mg/dL (ref 70–99)

## 2019-07-24 MED ORDER — INSULIN DETEMIR 100 UNIT/ML ~~LOC~~ SOLN
5.0000 [IU] | Freq: Every day | SUBCUTANEOUS | Status: DC
  Administered 2019-07-24: 5 [IU] via SUBCUTANEOUS
  Filled 2019-07-24 (×2): qty 0.05

## 2019-07-24 MED ORDER — TIZANIDINE HCL 2 MG PO TABS
2.0000 mg | ORAL_TABLET | Freq: Three times a day (TID) | ORAL | Status: DC
  Administered 2019-07-24 – 2019-07-27 (×8): 2 mg via ORAL
  Filled 2019-07-24 (×11): qty 1

## 2019-07-24 NOTE — Progress Notes (Signed)
Pt did not sleep much through out the night due to pain and nausea. Pt states nausea is worse with movement.  Pt also had 2 incontinent/continent episodes, requiring full linen change.- Pt requested to sit in the chair to see if it would be more comfortable.   Pt is also very tearful and "feeling down" due to the amount of help she is requiring. Nursing staff provided emotional support to pt.

## 2019-07-24 NOTE — Progress Notes (Signed)
Patient missed 0930 tizanidine. Provider aware.

## 2019-07-24 NOTE — Anesthesia Preprocedure Evaluation (Signed)
Anesthesia Evaluation  Patient identified by MRN, date of birth, ID band  Reviewed: Unable to perform ROS - Chart review onlyPreop documentation limited or incomplete due to emergent nature of procedure.  History of Anesthesia Complications (+) PONV and history of anesthetic complications  Airway        Dental   Pulmonary asthma ,           Cardiovascular hypertension,      Neuro/Psych  Headaches, CVA, Residual Symptoms    GI/Hepatic GERD  ,  Endo/Other  diabetesMorbid obesity  Renal/GU      Musculoskeletal   Abdominal   Peds  Hematology   Anesthesia Other Findings   Reproductive/Obstetrics                             Anesthesia Physical Anesthesia Plan  ASA: IV and emergent  Anesthesia Plan: General   Post-op Pain Management:    Induction: Intravenous  PONV Risk Score and Plan: 4 or greater and Treatment may vary due to age or medical condition  Airway Management Planned: Oral ETT  Additional Equipment: Arterial line  Intra-op Plan:   Post-operative Plan: Post-operative intubation/ventilation  Informed Consent:     History available from chart only and Only emergency history available  Plan Discussed with: CRNA and Surgeon  Anesthesia Plan Comments:         Anesthesia Quick Evaluation

## 2019-07-24 NOTE — Anesthesia Postprocedure Evaluation (Signed)
Anesthesia Post Note  Patient: Kathryn Coffey  Procedure(s) Performed: IR WITH ANESTHESIA (N/A )     Patient location during evaluation: SICU Anesthesia Type: General Level of consciousness: sedated Pain management: pain level controlled Vital Signs Assessment: post-procedure vital signs reviewed and stable Respiratory status: patient remains intubated per anesthesia plan Cardiovascular status: stable Postop Assessment: no apparent nausea or vomiting Anesthetic complications: no    Last Vitals:  Vitals:   07/22/19 1158 07/22/19 1639  BP: 125/81 129/84  Pulse: 70 74  Resp: 18 16  Temp: 36.8 C 36.9 C  SpO2: 95% 98%    Last Pain:  Vitals:   07/22/19 1639  TempSrc: Oral  PainSc:                  [name]

## 2019-07-24 NOTE — Progress Notes (Signed)
Occupational Therapy Session Note  Patient Details  Name: Kathryn Coffey MRN: [id number] Date of Birth: 1971-02-08  Today's Date: 07/24/2019 OT Individual Time: 4353-9122 OT Individual Time Calculation (min): 71 min   Short Term Goals: Week 1:  OT Short Term Goal 1 (Week 1): Pt will manage LUE prior to transfers with no VC to demo improved L attention OT Short Term Goal 2 (Week 1): Pt will consistently transfer to toilet with MIN A OT Short Term Goal 3 (Week 1): Pt will don shirt wiht MIN A  Skilled Therapeutic Interventions/Progress Updates:    Pt greeted semi-reclined in bed asleep and snoring loudly. Pt difficult to waken.  Pt would open eyes and briefly respond to OT, but then would fall back asleep. Pt required environmental changes with all lights on and tactile/verbal cues to finally wake. Pt reported need to go to the bathroom. Pt noted to have already been incontinent of urine in the bed. Stand-pivot bed>wc> BSC over toilet with mod A. Pt able to complete peri-care with OT providing mod A to stand. Pt reported feeling very tired. Pt set-up for breakfast with pt needing assistance to set-up and cute items. Pt then agreeable to shower. Stand-pivot into shower on L side with mod A. Bathing completed with overall mod/max A 2/2 L hemiplegia and body habitus. Dressing completed at the sink with OT providing education for hemi-dressing techniques. Pt still required mod A UB dressing and max A LB dressing. Stand-pivot to recliner with mod A> Pt left seated in recliner with chair alarm on, call bell in reach, and needs met.   Therapy Documentation Precautions:  Precautions Precautions: Fall Precaution Comments: left hemiparesis. L inattention Restrictions Weight Bearing Restrictions: No Pain: Pain Assessment Pain Scale: 0-10 Pain Score: 8  Pain Type: Chronic pain Pain Orientation: Right Pain Frequency: Constant   Therapy/Group: Individual Therapy  Valma Cava 07/24/2019, 8:45  AM

## 2019-07-24 NOTE — Plan of Care (Signed)
  Problem: Consults Goal: RH STROKE PATIENT EDUCATION Description: See Patient Education module for education specifics  Outcome: Progressing   Problem: RH BOWEL ELIMINATION Goal: RH STG MANAGE BOWEL W/MEDICATION W/ASSISTANCE Description: STG Manage Bowel with Medication with [place]. Outcome: Progressing   Problem: RH SKIN INTEGRITY Goal: RH STG MAINTAIN SKIN INTEGRITY WITH ASSISTANCE Description: STG Maintain Skin Integrity With Mod I Assistance. Outcome: Progressing   Problem: RH SAFETY Goal: RH STG ADHERE TO SAFETY PRECAUTIONS W/ASSISTANCE/DEVICE Description: STG Adhere to Safety Precautions With Mod I Assistance/Device. Outcome: Progressing Goal: RH STG DECREASED RISK OF FALL WITH ASSISTANCE Description: STG Decreased Risk of Fall With Mod I Assistance. Outcome: Progressing   Problem: RH PAIN MANAGEMENT Goal: RH STG PAIN MANAGED AT OR BELOW PT'S PAIN GOAL Outcome: Progressing

## 2019-07-24 NOTE — Progress Notes (Signed)
Physical Therapy Session Note  Patient Details  Name: Kathryn Coffey MRN: [id number] Date of Birth: 1970-09-29  Today's Date: 07/24/2019 PT Individual Time: 1300-1400 PT Individual Time Calculation (min): 60 min   Short Term Goals: Week 1:  PT Short Term Goal 1 (Week 1): Pt will consistently perform bed mobility with mod assist PT Short Term Goal 2 (Week 1): Pt will ambulate >75ft with mod assist and LRAD PT Short Term Goal 3 (Week 1): Pt will propell WC 116ft with min assist PT Short Term Goal 4 (Week 1): Pt will initiate stair training.  Skilled Therapeutic Interventions/Progress Updates:    Pt received performing stand pivot transfer from bed to w/c for toileting with NT and husband. Pt has had urinary incontinence in her bed. Toilet transfer with mod A, dependent for clothing management. Pt unable to void once seated on toilet. Pt is dependent for pericare in standing as well as changing soiled pants and donning new brief. Stand pivot transfer back to w/c with mod A. Stand pivot transfer w/c to mat table in therapy gym with RW and mod A. Pt exhibits LLE ataxia with transfers and standing. Sit to stand x 10 reps from mat table to RW with R hand orthosis, focus on hand placement during transfer and safety awareness, mod A for transfer. Standing with use of mirror for visual feedback and pt tends to lean to the R in standing due to L inattention. Pt fatigues quickly during session and frequent sits down without warning, exhibits impulsivity. Stand pivot transfer back to w/c then to recliner in room with RW and mod A. Pt agreeable to stay seated up in recliner until next therapy session. Provided hot pack to R shoulder for scapular pain relief, pain not rated. Pt left reclined in recliner in room with needs in reach, husband present at end of session.  Therapy Documentation Precautions:  Precautions Precautions: Fall Precaution Comments: left hemiparesis. L inattention Restrictions Weight  Bearing Restrictions: No    Therapy/Group: Individual Therapy   Kathryn Coffey, PT, DPT  07/24/2019, 3:51 PM
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
## 2019-07-24 NOTE — Progress Notes (Signed)
Pt c/o nausea, w/no vomiting at this time. Pt states the nausea is coming from the amount of pain she is experiencing w/ her right shoulder. PO nausea meds given w/ tylenol, and topical cream applied to R shoulder. PT states Shoulder pain is 10/10, tramadol was not helpful.

## 2019-07-24 NOTE — Progress Notes (Addendum)
[facility] PHYSICAL MEDICINE & REHABILITATION PROGRESS NOTE   Subjective/Complaints: Had another rough night. Right scapula very tender despite TPI's. Had nausea d/t pain. Has had shoulder pain before stroke  ROS: Patient denies fever, rash, sore throat, blurred vision, nausea, vomiting, diarrhea, cough, shortness of breath or chest pain,  headache, or mood change.   Objective:   No results found. Recent Labs    07/22/19 0408 07/23/19 0521  WBC 12.0* 11.9*  HGB 8.9* 9.0*  HCT 29.7* 29.5*  PLT 465* 483*   Recent Labs    07/22/19 0408 07/23/19 0521  NA 139 136  K 3.5 3.6  CL 105 102  CO2 26 24  GLUCOSE 145* 144*  BUN 15 14  CREATININE 0.65 0.52  CALCIUM 8.8* 8.7*    Intake/Output Summary (Last 24 hours) at 07/24/2019 [id number] Last data filed at 07/23/2019 1803 Gross per 24 hour  Intake 120 ml  Output -  Net 120 ml     Physical Exam: Vital Signs Blood pressure 100/66, pulse 71, temperature 98.3 F (36.8 C), temperature source Oral, resp. rate 16, height 5\' 5"  (1.651 m), weight 124 kg, SpO2 95 %. Constitutional: No distress . Vital signs reviewed. HEENT: EOMI, oral membranes moist Neck: supple Cardiovascular: RRR without murmur. No JVD    Respiratory: CTA Bilaterally without wheezes or rales. Normal effort    GI: BS +, non-tender, non-distended  Skin: intact Neuro: AOx3. Left facial droop. CN 2-12 otherwise intact.  Sensation 1+/2 left 5/5 strength on right side. LUE: 0/5 prox to distal.  LLE: 3 to 3+/5 prox to distal Musc: Right shoulder with deltoid,pec, subacromial pain, mild discomfort with IR/ER. Greatest pain around right rhomboids/lat--TPI's?.   Psych: pleasant, in mild distress   Assessment/Plan: 1. Functional deficits secondary to acute ischemic right MCA stroke which require 3+ hours per day of interdisciplinary therapy in a comprehensive inpatient rehab setting.  Physiatrist is providing close team supervision and 24 hour management of active medical  problems listed below.  Physiatrist and rehab team continue to assess barriers to discharge/monitor patient progress toward functional and medical goals  Care Tool:  Bathing    Body parts bathed by patient: Chest, Abdomen, Right upper leg, Left upper leg, Right lower leg, Face   Body parts bathed by helper: Front perineal area, Buttocks, Left lower leg, Left arm     Bathing assist Assist Level: Moderate Assistance - Patient 50 - 74%     Upper Body Dressing/Undressing Upper body dressing   What is the patient wearing?: Bra, Pull over shirt    Upper body assist Assist Level: Moderate Assistance - Patient 50 - 74%    Lower Body Dressing/Undressing Lower body dressing    Lower body dressing activity did not occur: N/A What is the patient wearing?: Incontinence brief, Pants     Lower body assist Assist for lower body dressing: Moderate Assistance - Patient 50 - 74%     Toileting Toileting    Toileting assist Assist for toileting: Maximal Assistance - Patient 25 - 49%     Transfers Chair/bed transfer  Transfers assist     Chair/bed transfer assist level: Maximal Assistance - Patient 25 - 49%     Locomotion Ambulation   Ambulation assist      Assist level: Moderate Assistance - Patient 50 - 74% Assistive device: No Device Max distance: 10   Walk 10 feet activity   Assist     Assist level: Moderate Assistance - Patient - 50 - 74% Assistive  device: No Device   Walk 50 feet activity   Assist Walk 50 feet with 2 turns activity did not occur: Safety/medical concerns  Assist level: Moderate Assistance - Patient - 50 - 74%      Walk 150 feet activity   Assist Walk 150 feet activity did not occur: Safety/medical concerns         Walk 10 feet on uneven surface  activity   Assist Walk 10 feet on uneven surfaces activity did not occur: Safety/medical concerns         Wheelchair     Assist Will patient use wheelchair at discharge?:  Yes Type of Wheelchair: Manual    Wheelchair assist level: Moderate Assistance - Patient 50 - 74% Max wheelchair distance: 75    Wheelchair 50 feet with 2 turns activity    Assist        Assist Level: Moderate Assistance - Patient 50 - 74%   Wheelchair 150 feet activity     Assist  Wheelchair 150 feet activity did not occur: Safety/medical concerns       Blood pressure 100/66, pulse 71, temperature 98.3 F (36.8 C), temperature source Oral, resp. rate 16, height 5\' 5"  (1.651 m), weight 124 kg, SpO2 95 %.    Medical Problem List and Plan: 1.  Impaired mobility and strength secondary to R MCA infarct             -patient may shower             --Continue CIR therapies including PT, OT, and SLP  2.  Antithrombotics: -DVT/anticoagulation:  Pharmaceutical: Lovenox             -antiplatelet therapy: ASA/Brillinta 3. Chronic R>L knee pain/Pain Management: will add Sportscreme prn. May need right knee sleeve for support.              Chronic right shoulder scapular dyskinesis--likely worsened by increased use due to flaccid left extremity. No real benefits with TPI's. Pain most prominent along rhomboids which would be c/w history  -begin trial of tizanidine  -kpad  -Scapular ROM with therapies  -lidoderm patch 4. Mood: LCSW to follow for evaluation and support.              -antipsychotic agents: N/A             -resumed wellbutrin per home dose  -on ritalin at home too ---consider resuming  -neuropsych eval/treat would be helpful.  5. Neuropsych: This patient is capable of making decisions on her own behalf. 6. Skin/Wound Care: Routine pressure relief measures.  7. Fluids/Electrolytes/Nutrition: Monitor I/O. Check lytes in am.  8. Chronic HA: Takes goody powders/Motrin/Ultram. Continue tylenol prn.   - low dose topamax added.  9. T2DM: Hgb A1c-8.0.  Monitor diet.   Resumed home dose metformin  And Victoza    -wean levemir and observe cbg's  - sugars under  reasonable control  11. H/o band failure/gastric sleeve: Continue multivitamin. Appetite has been good.  12.  Sinus pressure: Felt to be due to allergies. Resume Flonase and allegra to help manage symptoms. Resume allergy injections after discharge.   LOS: 2 days A FACE TO FACE EVALUATION WAS PERFORMED  Meredith Staggers 07/24/2019, 9:22 AM

## 2019-07-24 NOTE — Progress Notes (Signed)
Speech Language Pathology Daily Session Note  Patient Details  Name: Kathryn Coffey MRN: [id number] Date of Birth: Mar 18, 1971  Today's Date: 07/24/2019 SLP Individual Time: 1220-1245 SLP Individual Time Calculation (min): 25 min  Missed Time: 20 minutes due to fatigue: this was SLP's second attempt in seeing patient due to fatigue from pain medication and not sleeping well last night 2/2 pain.   Short Term Goals: Week 1: SLP Short Term Goal 1 (Week 1): Patient will demonstrate functional problem solving for complex tasks with Mod I. SLP Short Term Goal 2 (Week 1): Patient will demonstrate selective attention to a task in a mildly distracting enviornment for 20 minutes with supervision level verbal cues for redirection. SLP Short Term Goal 3 (Week 1): Patient will attend/scan to left field of enviornment during functional tasks with Mod verbal cues. SLP Short Term Goal 4 (Week 1): Patient will consume current diet without overt s/s of aspiration and overall Mod I for use of swallowing compensatory strategies. SLP Short Term Goal 5 (Week 1): Patient will demonstrate efficient mastication with complete oral clearance with trials of regular textures without overt s/s of aspiration over 2 sessions prior to upgrade.  Skilled Therapeutic Interventions: Skilled treatment session focused on cognitive and dysphagia goals. Upon arrival, patient was sleep in bed and required extra time with Mod verbal cues to rouse. Patient's husband present and educated in regards to patient's current cognitive and swallowing deficits and goals of skilled SLP intervention, he verbalized understanding. Patient consumed lunch meal of Dys. 3 textures with thin liquids via cup without overt s/s of aspiration and minimal oral residue despite patient masticating the majority of the bolus on the left side of her oral cavity in order to maximize strength. Suspect patient could upgrade to regular textures but patient prefers mechanical  soft textures at this time.  Patient was overall Mod I for tray set-up and alternated attention between self-feeding and a conversation with Mod I. Patient left upright in bed with husband present. Continue with current plan of care.      Pain No/Denies Pain   Therapy/Group: Individual Therapy  Kathryn Coffey 07/24/2019, 3:11 PM

## 2019-07-25 ENCOUNTER — Inpatient Hospital Stay (HOSPITAL_COMMUNITY): Payer: No Typology Code available for payment source | Admitting: Speech Pathology

## 2019-07-25 ENCOUNTER — Inpatient Hospital Stay (HOSPITAL_COMMUNITY): Payer: No Typology Code available for payment source | Admitting: Occupational Therapy

## 2019-07-25 ENCOUNTER — Inpatient Hospital Stay (HOSPITAL_COMMUNITY): Payer: No Typology Code available for payment source

## 2019-07-25 LAB — GLUCOSE, CAPILLARY
Glucose-Capillary: 94 mg/dL (ref 70–99)
Glucose-Capillary: 98 mg/dL (ref 70–99)
Glucose-Capillary: 87 mg/dL (ref 70–99)
Glucose-Capillary: 85 mg/dL (ref 70–99)

## 2019-07-25 NOTE — Progress Notes (Signed)
Physical Therapy Session Note  Patient Details  Name: Kathryn Coffey MRN: [id number] Date of Birth: 1971/04/30  Today's Date: 07/25/2019 PT Individual Time: 1302-1400 PT Individual Time Calculation (min): 58 min   Short Term Goals: Week 1:  PT Short Term Goal 1 (Week 1): Pt will consistently perform bed mobility with mod assist PT Short Term Goal 2 (Week 1): Pt will ambulate >60ft with mod assist and LRAD PT Short Term Goal 3 (Week 1): Pt will propell WC 147ft with min assist PT Short Term Goal 4 (Week 1): Pt will initiate stair training.  Skilled Therapeutic Interventions/Progress Updates:    Session focused on trial of AFO, air cast and combination to address L knee hyperextension and L ankle rolling during gait and standing. Pt performs sit <> stand with overall min assist with cues for attention to L foot placement, cues for controlled movements, and min assist for functional balance. Pt also requires cues to for LUE management during transitions. Pt able to gait x 20' x 3 reps with combination of above with overall min to mod assist due to poor postural control, LLE decreased coordination and stability, and impulsivity. AFO with anterior support provided good control of L knee hyperextension but ankle still rolls. With both aircast and AFO, too bulky and heavy for patient to advance with control. With just aircast, knee with significant hyperextension and poor foot clearance. Recommend to continue to trial AFO and monitor ankle potentially with AFO consult next week for more appropriate style or size due to limited selection of trial for L available today. Pt does report improved feeling better with AFO and noted improved foot clearance and knee control. Requires total assist for donning/doffing shoes and braces. End of session returned back to bed with mod assist due to poor awareness of self and impulsivity. Mod assist to return to supine and husband assisting. PT donned L WHO and propped on  pillow for support and positioning.    Therapy Documentation Precautions:  Precautions Precautions: Fall Precaution Comments: left hemiparesis. L inattention Restrictions Weight Bearing Restrictions: No Pain:  No pain.  Therapy/Group: Individual Therapy  Canary Brim Ivory Broad, PT, DPT, CBIS  07/25/2019, 3:37 PM

## 2019-07-25 NOTE — Progress Notes (Signed)
Speech Language Pathology Daily Session Note  Patient Details  Name: Kathryn Coffey MRN: [id number] Date of Birth: December 18, 1970  Today's Date: 07/25/2019 SLP Individual Time: 0900-0940 SLP Individual Time Calculation (min): 40 min  Short Term Goals: Week 1: SLP Short Term Goal 1 (Week 1): Patient will demonstrate functional problem solving for complex tasks with Mod I. SLP Short Term Goal 2 (Week 1): Patient will demonstrate selective attention to a task in a mildly distracting enviornment for 20 minutes with supervision level verbal cues for redirection. SLP Short Term Goal 3 (Week 1): Patient will attend/scan to left field of enviornment during functional tasks with Mod verbal cues. SLP Short Term Goal 4 (Week 1): Patient will consume current diet without overt s/s of aspiration and overall Mod I for use of swallowing compensatory strategies. SLP Short Term Goal 5 (Week 1): Patient will demonstrate efficient mastication with complete oral clearance with trials of regular textures without overt s/s of aspiration over 2 sessions prior to upgrade.  Skilled Therapeutic Interventions: Skilled treatment session focused on cognitive goals. SLP facilitated session by providing supervision level verbal cues for patient to self-monitor and correct errors during both a complex money management task and complex medication management task due to impulsivity. Patient consistently looking at her cell phone throughout session (son's birthday today) but independently redirected to task at hand. Patient left upright in wheelchair with alarm on and all needs within reach. Continue with current plan of care.      Pain No/Denies Pain   Therapy/Group: Individual Therapy  Jammie Clink 07/25/2019, 12:19 PM

## 2019-07-25 NOTE — IPOC Note (Signed)
Overall Plan of Care [facility]) Patient Details Name: MARDELLE RADZINSKI MRN: [id number] DOB: Mar 24, 1971  Admitting Diagnosis: Acute ischemic right middle cerebral artery (MCA) stroke [facility])  Hospital Problems: Principal Problem:   Acute ischemic right middle cerebral artery (MCA) stroke ([place])     Functional Problem List: Nursing Medication Management, Safety, Pain, Endurance, Motor, Bladder  PT Balance, Endurance, Edema, Motor, Perception, Safety, Sensory, Skin Integrity  OT Balance, Cognition, Endurance, Motor, Perception, Safety, Vision  SLP Behavior, Nutrition  TR         Basic ADL's: OT Grooming, Bathing, Dressing, Toileting     Advanced  ADL's: OT       Transfers: PT Bed Mobility, Bed to Chair, Car, Manufacturing systems engineer, Metallurgist: PT Ambulation, Emergency planning/management officer, Stairs     Additional Impairments: OT Fuctional Use of Upper Extremity  SLP Swallowing, Social Cognition   Problem Solving, Attention  TR      Anticipated Outcomes Item Anticipated Outcome  Self Feeding S  Swallowing  Mod I   Basic self-care  S  Toileting  S   Bathroom Transfers s  Bowel/Bladder  To be continent of bowel/bladder at the time of dicharge with Mod I  Transfers  Supervision assist with LRAD  Locomotion  Supervision -CGA with LRAD at ambulatory level  Communication     Cognition  Supervision-Mod I  Pain  To remain pain free at the time of disharge with MOd I  Safety/Judgment  To be able to call for help independently   Therapy Plan: PT Frequency: 5 out of 7 days PT Duration Estimated Length of Stay: 17-21 days OT Intensity: Minimum of 1-2 x/day, 45 to 90 minutes OT Frequency: 5 out of 7 days OT Duration/Estimated Length of Stay: 2.5-3 weeks SLP Intensity: Minumum of 1-2 x/day, 30 to 90 minutes SLP Frequency: 3 to 5 out of 7 days SLP Duration/Estimated Length of Stay: 2.5-3 weeks   Due to the current state of emergency, patients may not be receiving  their 3-hours of Medicare-mandated therapy.   Team Interventions: Nursing Interventions Patient/Family Education, Bladder Management, Pain Management, Medication Management, Discharge Planning, Disease Management/Prevention  PT interventions Ambulation/gait training, Discharge planning, Psychosocial support, Functional mobility training, Therapeutic Activities, Visual/perceptual remediation/compensation, Therapeutic Exercise, Wheelchair propulsion/positioning, Skin care/wound management, Neuromuscular re-education, Disease management/prevention, Training and development officer, Cognitive remediation/compensation, DME/adaptive equipment instruction, Pain management, Splinting/orthotics, UE/LE Strength taining/ROM, Patient/family education, Functional electrical stimulation, Community reintegration, IT trainer, UE/LE Coordination activities  OT Interventions Training and development officer, Discharge planning, Functional electrical stimulation, Pain management, Self Care/advanced ADL retraining, Therapeutic Activities, UE/LE Coordination activities, Cognitive remediation/compensation, Disease mangement/prevention, Functional mobility training, Patient/family education, Skin care/wound managment, Therapeutic Exercise, Visual/perceptual remediation/compensation, Academic librarian, Engineer, drilling, Neuromuscular re-education, Psychosocial support, Splinting/orthotics, UE/LE Strength taining/ROM, Wheelchair propulsion/positioning  SLP Interventions Cognitive remediation/compensation, Dysphagia/aspiration precaution training, Internal/external aids, Environmental controls, Therapeutic Activities, Functional tasks, Patient/family education  TR Interventions    SW/CM Interventions Discharge Planning, Psychosocial Support, Patient/Family Education   Barriers to Discharge MD  Medical stability  Nursing      PT Inaccessible home environment, Decreased caregiver support, Home environment  access/layout    OT Decreased caregiver support, Weight, Home environment access/layout    SLP      SW       Team Discharge Planning: Destination: PT-Home ,OT- Home , SLP-Home Projected Follow-up: PT-Home health PT, OT-  Home health OT, SLP-(TBD) Projected Equipment Needs: PT-Rolling walker with 5" wheels, Wheelchair (measurements), Wheelchair cushion (measurements), OT- Tub/shower bench, 3 in 1 bedside  comode, To be determined, SLP-None recommended by SLP Equipment Details: PT- , OT-  Patient/family involved in discharge planning: PT- Patient, Family member/caregiver,  OT-Patient, Family member/caregiver, SLP-Patient  MD ELOS: 17-21 days Medical Rehab Prognosis:  Excellent Assessment: The patient has been admitted for CIR therapies with the diagnosis of right MCA infarct. The team will be addressing functional mobility, strength, stamina, balance, safety, adaptive techniques and equipment, self-care, bowel and bladder mgt, patient and caregiver education, NMR, cognition, communication, ego support, pain control. Goals have been set at supervision for basic mobility and self-care and supervision to mod I with cognition and communication.   Due to the current state of emergency, patients may not be receiving their 3 hours per day of Medicare-mandated therapy.    Meredith Staggers, MD, FAAPMR      See Team Conference Notes for weekly updates to the plan of care

## 2019-07-25 NOTE — Progress Notes (Signed)
[place] PHYSICAL MEDICINE & REHABILITATION PROGRESS NOTE   Subjective/Complaints: Slept much better last night. Felt that tizanidine was very helpful for shoulder/back pain.   ROS: Patient denies fever, rash, sore throat, blurred vision, nausea, vomiting, diarrhea, cough, shortness of breath or chest pain, headache, or mood change.    Objective:   No results found. Recent Labs    07/23/19 0521  WBC 11.9*  HGB 9.0*  HCT 29.5*  PLT 483*   Recent Labs    07/23/19 0521  NA 136  K 3.6  CL 102  CO2 24  GLUCOSE 144*  BUN 14  CREATININE 0.52  CALCIUM 8.7*    Intake/Output Summary (Last 24 hours) at 07/25/2019 0936 Last data filed at 07/25/2019 0744 Gross per 24 hour  Intake 460 ml  Output -  Net 460 ml     Physical Exam: Vital Signs Blood pressure 117/70, pulse 73, temperature 97.9 F (36.6 C), temperature source Oral, resp. rate 16, height 5\' 5"  (1.651 m), weight 124 kg, SpO2 94 %. Constitutional: No distress . Vital signs reviewed. HEENT: EOMI, oral membranes moist Neck: supple Cardiovascular: RRR without murmur. No JVD    Respiratory: CTA Bilaterally without wheezes or rales. Normal effort    GI: BS +, non-tender, non-distended  Skin: intact Neuro: AOx3. Left facial droop. CN 2-12 otherwise intact.  Sensation 1+/2 LUE and LLE 5/5 strength on right side. LUE: 0/5 prox to distal--no change.  LLE: 3 to 3+/5 prox to distal Musc: right shoulder/scapula moving much more freely. ABD/ER/IR with minimal pain this morning. No palpable trigger points   Psych: pleasant  Assessment/Plan: 1. Functional deficits secondary to acute ischemic right MCA stroke which require 3+ hours per day of interdisciplinary therapy in a comprehensive inpatient rehab setting.  Physiatrist is providing close team supervision and 24 hour management of active medical problems listed below.  Physiatrist and rehab team continue to assess barriers to discharge/monitor patient progress toward  functional and medical goals  Care Tool:  Bathing    Body parts bathed by patient: Chest, Abdomen, Right upper leg, Left upper leg, Right lower leg, Face   Body parts bathed by helper: Front perineal area, Buttocks, Left lower leg, Left arm, Right lower leg     Bathing assist Assist Level: Moderate Assistance - Patient 50 - 74%     Upper Body Dressing/Undressing Upper body dressing   What is the patient wearing?: Bra, Pull over shirt    Upper body assist Assist Level: Moderate Assistance - Patient 50 - 74%    Lower Body Dressing/Undressing Lower body dressing    Lower body dressing activity did not occur: N/A What is the patient wearing?: Incontinence brief, Pants     Lower body assist Assist for lower body dressing: Maximal Assistance - Patient 25 - 49%     Toileting Toileting    Toileting assist Assist for toileting: 2 Helpers     Transfers Chair/bed transfer  Transfers assist     Chair/bed transfer assist level: Moderate Assistance - Patient 50 - 74%     Locomotion Ambulation   Ambulation assist      Assist level: Moderate Assistance - Patient 50 - 74% Assistive device: Walker-rolling(hand orthosis) Max distance: 10'   Walk 10 feet activity   Assist     Assist level: Moderate Assistance - Patient - 50 - 74% Assistive device: Walker-rolling   Walk 50 feet activity   Assist Walk 50 feet with 2 turns activity did not occur: Safety/medical concerns  Assist level: Moderate Assistance - Patient - 50 - 74%      Walk 150 feet activity   Assist Walk 150 feet activity did not occur: Safety/medical concerns         Walk 10 feet on uneven surface  activity   Assist Walk 10 feet on uneven surfaces activity did not occur: Safety/medical concerns         Wheelchair     Assist Will patient use wheelchair at discharge?: Yes Type of Wheelchair: Manual    Wheelchair assist level: Moderate Assistance - Patient 50 - 74% Max  wheelchair distance: 75    Wheelchair 50 feet with 2 turns activity    Assist        Assist Level: Moderate Assistance - Patient 50 - 74%   Wheelchair 150 feet activity     Assist  Wheelchair 150 feet activity did not occur: Safety/medical concerns       Blood pressure 117/70, pulse 73, temperature 97.9 F (36.6 C), temperature source Oral, resp. rate 16, height 5\' 5"  (1.651 m), weight 124 kg, SpO2 94 %.    Medical Problem List and Plan: 1.  Impaired mobility and strength secondary to R MCA infarct             -patient may shower             --Continue CIR therapies including PT, OT, and SLP   -left WHO ordered 2.  Antithrombotics: -DVT/anticoagulation:  Pharmaceutical: Lovenox             -antiplatelet therapy: ASA/Brillinta 3. Chronic R>L knee pain/Pain Management: will add Sportscreme prn. May need right knee sleeve for support.              Chronic right shoulder scapular dyskinesis--likely worsened by increased use due to flaccid left extremity. No real benefits with TPI's. Rhomboids most involved  -tizanidine has proven very helpful--continue 2mg  tid side effect permitting  -continue kpad  -Scapular ROM with therapies  -lidoderm patch 4. Mood: LCSW to follow for evaluation and support.              -antipsychotic agents: N/A             -resumed wellbutrin per home dose  -on ritalin at home too ---consider resuming at some point  -neuropsych eval/treat would be helpful.  5. Neuropsych: This patient is capable of making decisions on her own behalf. 6. Skin/Wound Care: Routine pressure relief measures.  7. Fluids/Electrolytes/Nutrition: Monitor I/O. Check lytes in am.  8. Chronic HA: Takes goody powders/Motrin/Ultram. Continue tylenol prn.   - low dose topamax added--continue for now 9. T2DM: Hgb A1c-8.0.  Monitor diet.   Resumed home dose metformin and Victoza    -cbg's ok with decrease in levemir. Will stop levemir tonight    11. H/o band failure/gastric  sleeve: Continue multivitamin. Appetite has been good.  12.  Sinus pressure: Felt to be due to allergies. Resumed Flonase and allegra to help manage symptoms. Resume allergy injections after discharge.   LOS: 3 days A FACE TO FACE EVALUATION WAS PERFORMED  Meredith Staggers 07/25/2019, 9:36 AM

## 2019-07-25 NOTE — Progress Notes (Signed)
Occupational Therapy Session Note  Patient Details  Name: Kathryn Coffey MRN: [id number] Date of Birth: [birth year]  Today's Date: 07/25/2019 OT Individual Time: 1045-1200 OT Individual Time Calculation (min): 75 min    Short Term Goals: Week 1:  OT Short Term Goal 1 (Week 1): Pt will manage LUE prior to transfers with no VC to demo improved L attention OT Short Term Goal 2 (Week 1): Pt will consistently transfer to toilet with MIN A OT Short Term Goal 3 (Week 1): Pt will don shirt wiht MIN A  Skilled Therapeutic Interventions/Progress Updates:   Pt greeted sitting in wc with husband present. Pt reported she had already washed up a little this morning and just wanted to get dressed. Pt was much more alert and awake today compared to yesterday. Pt reported the muscle relaxer makes her sleepy. Worked on dressing strategies using hemi-dressing techniques. OT demonstrated UB dressing strategy, then pt able to demonstrate understanding with mod A and instructional cues. Pt was able to achieve figure 4 position with LLE and min A. PT then was able to thread LLE into pant legs. Pt with difficulty maintaining balance to lean forward to thread R LE requiring OT assist. Mod A sit<>stand and assist to then pull pants over hips. Pt brought down to therapy gym in wc. 1:1 NMES applied to supraspinatus and middle deltoid to help approximate shoulder joint to reduce sublux and reduce pain.   Ratio 1:3 Rate 35 pps Waveform- Asymmetric Ramp 1.0 Pulse 300 Intensity- 17 Duration -   15  Report of pain at the beginning of session 0 Report of pain at the end of session 0  Pt completed stand-pivot to therapy mat with mod A. Pt brought into gravity eliminated slidelying position for neuro re-ed. Pt with more scapula and shoulder activation in sidelying. OT provided joint input to bring UE through full ROM. Weight bearing through L elbow in sitting and reaching across midline. OT educated pt on self-ROM  techniques and instructed pt's spouse on L UE there-ex and repetitions he can also do with pt. Pt returned to room and left sitting in wc with chair alarm on, call bell in reach, and husband present.   No adverse reactions after treatment and is skin intact.     Therapy Documentation Precautions:  Precautions Precautions: Fall Precaution Comments: left hemiparesis. L inattention Restrictions Weight Bearing Restrictions: No Pain: Pain Assessment Pain Scale: 0-10 Pain Score: 3    Therapy/Group: Individual Therapy  Valma Cava 07/25/2019, 12:30 PM

## 2019-07-25 NOTE — Progress Notes (Signed)
Occupational Therapy Session Note  Patient Details  Name: Kathryn Coffey MRN: [id number] Date of Birth: 25-Apr-1971  Today's Date: 07/25/2019 OT Individual Time: 0830-0900 OT Individual Time Calculation (min): 30 min    Short Term Goals: Week 1:  OT Short Term Goal 1 (Week 1): Pt will manage LUE prior to transfers with no VC to demo improved L attention OT Short Term Goal 2 (Week 1): Pt will consistently transfer to toilet with MIN A OT Short Term Goal 3 (Week 1): Pt will don shirt wiht MIN A  Skilled Therapeutic Interventions/Progress Updates:    1:1 Pt awake and alert when arrived. REported she feels the mm relaxor makes her too tired. Pt transitioned to EOB with mod A with extra time. Pt able to maintain sitting balance at EOB with supervision. Addressed scapular activation in left UE in upright sitting, as well as shoulder abduction, and elbow extension and flexion. Pt with trace movement at the elbow but 1/5 in the shoulder and scapular movement detected. Reiterated importance of position for left UE in functional tasks. Pt transferred stand pivot with mod A to the left into her w/c. Perform hair brushing and face washing at the sink with setup. Left with SLp at end of session.   Therapy Documentation Precautions:  Precautions Precautions: Fall Precaution Comments: left hemiparesis. L inattention Restrictions Weight Bearing Restrictions: No Pain: Some discomfort in right shoulder relief with rest; will follow up about use of k pad  Therapy/Group: Individual Therapy  [name] [facility] 07/25/2019, 11:38 AM

## 2019-07-25 NOTE — Plan of Care (Signed)
  Problem: Consults Goal: RH STROKE PATIENT EDUCATION Description: See Patient Education module for education specifics  Outcome: Progressing   Problem: RH BOWEL ELIMINATION Goal: RH STG MANAGE BOWEL W/MEDICATION W/ASSISTANCE Description: STG Manage Bowel with Medication with [place]. Outcome: Progressing   Problem: RH SKIN INTEGRITY Goal: RH STG MAINTAIN SKIN INTEGRITY WITH ASSISTANCE Description: STG Maintain Skin Integrity With Mod I Assistance. Outcome: Progressing   Problem: RH SAFETY Goal: RH STG ADHERE TO SAFETY PRECAUTIONS W/ASSISTANCE/DEVICE Description: STG Adhere to Safety Precautions With Mod I Assistance/Device. Outcome: Progressing Goal: RH STG DECREASED RISK OF FALL WITH ASSISTANCE Description: STG Decreased Risk of Fall With Mod I Assistance. Outcome: Progressing   Problem: RH PAIN MANAGEMENT Goal: RH STG PAIN MANAGED AT OR BELOW PT'S PAIN GOAL Outcome: Progressing

## 2019-07-26 ENCOUNTER — Inpatient Hospital Stay (HOSPITAL_COMMUNITY): Payer: No Typology Code available for payment source | Admitting: Occupational Therapy

## 2019-07-26 ENCOUNTER — Inpatient Hospital Stay (HOSPITAL_COMMUNITY): Payer: No Typology Code available for payment source | Admitting: Speech Pathology

## 2019-07-26 ENCOUNTER — Inpatient Hospital Stay (HOSPITAL_COMMUNITY): Payer: No Typology Code available for payment source | Admitting: Physical Therapy

## 2019-07-26 LAB — GLUCOSE, CAPILLARY
Glucose-Capillary: 98 mg/dL (ref 70–99)
Glucose-Capillary: 113 mg/dL — ABNORMAL HIGH (ref 70–99)
Glucose-Capillary: 76 mg/dL (ref 70–99)
Glucose-Capillary: 116 mg/dL — ABNORMAL HIGH (ref 70–99)
Glucose-Capillary: 102 mg/dL — ABNORMAL HIGH (ref 70–99)

## 2019-07-26 NOTE — Progress Notes (Signed)
Patient reassessed two hours post fall. Patient remains at baseline, no new complaints of pain. Patient resting, call light within reach, bed alarm on. Will continue to monitor.

## 2019-07-26 NOTE — Progress Notes (Signed)
Kathryn Coffey PHYSICAL MEDICINE & REHABILITATION PROGRESS NOTE   Subjective/Complaints: Calf cramping at night on left, no prob in therapy PT requesting Left AFO   ROS: Patient denies , nausea, vomiting, diarrhea, cough, shortness of breath or chest pain   Objective:   No results found. No results for input(s): WBC, HGB, HCT, PLT in the last 72 hours. No results for input(s): NA, K, CL, CO2, GLUCOSE, BUN, CREATININE, CALCIUM in the last 72 hours.  Intake/Output Summary (Last 24 hours) at 07/26/2019 0806 Last data filed at 07/26/2019 0742 Gross per 24 hour  Intake 560 ml  Output -  Net 560 ml     Physical Exam: Vital Signs Blood pressure 118/72, pulse 74, temperature 97.9 F (36.6 C), resp. rate 16, height 5\' 5"  (1.651 m), weight 124 kg, SpO2 96 %. Constitutional: No distress . Vital signs reviewed. HEENT: EOMI, oral membranes moist Neck: supple Cardiovascular: RRR without murmur. No JVD    Respiratory: CTA Bilaterally without wheezes or rales. Normal effort    GI: BS +, non-tender, non-distended  Skin: intact Neuro: AOx3. Left facial droop. CN 2-12 otherwise intact.  Sensation 1+/2 LUE and LLE 5/5 strength on right side. LUE: 0/5 prox to distal--no change.  LLE: 3- HF, KE, ADF  Musc: right shoulder/scapula moving much more freely. ABD/ER/IR with minimal pain this morning. No palpable trigger points   Psych: pleasant  Assessment/Plan: 1. Functional deficits secondary to acute ischemic right MCA stroke which require 3+ hours per day of interdisciplinary therapy in a comprehensive inpatient rehab setting.  Physiatrist is providing close team supervision and 24 hour management of active medical problems listed below.  Physiatrist and rehab team continue to assess barriers to discharge/monitor patient progress toward functional and medical goals  Care Tool:  Bathing    Body parts bathed by patient: Chest, Abdomen, Right upper leg, Left upper leg, Right lower leg, Face   Body parts bathed by helper: Front perineal area, Buttocks, Left lower leg, Left arm, Right lower leg     Bathing assist Assist Level: Moderate Assistance - Patient 50 - 74%     Upper Body Dressing/Undressing Upper body dressing   What is the patient wearing?: Bra, Pull over shirt    Upper body assist Assist Level: Moderate Assistance - Patient 50 - 74%    Lower Body Dressing/Undressing Lower body dressing      What is the patient wearing?: Pants, Incontinence brief     Lower body assist Assist for lower body dressing: Maximal Assistance - Patient 25 - 49%     Toileting Toileting    Toileting assist Assist for toileting: Maximal Assistance - Patient 25 - 49%     Transfers Chair/bed transfer  Transfers assist     Chair/bed transfer assist level: Minimal Assistance - Patient > 75%     Locomotion Ambulation   Ambulation assist      Assist level: Moderate Assistance - Patient 50 - 74% Assistive device: Walker-rolling Max distance: 20'   Walk 10 feet activity   Assist     Assist level: Moderate Assistance - Patient - 50 - 74% Assistive device: Walker-rolling, Orthosis   Walk 50 feet activity   Assist Walk 50 feet with 2 turns activity did not occur: Safety/medical concerns         Walk 150 feet activity   Assist Walk 150 feet activity did not occur: Safety/medical concerns         Walk 10 feet on uneven surface  activity  Assist Walk 10 feet on uneven surfaces activity did not occur: Safety/medical concerns         Wheelchair     Assist Will patient use wheelchair at discharge?: Yes Type of Wheelchair: Manual    Wheelchair assist level: Moderate Assistance - Patient 50 - 74% Max wheelchair distance: 75    Wheelchair 50 feet with 2 turns activity    Assist        Assist Level: Moderate Assistance - Patient 50 - 74%   Wheelchair 150 feet activity     Assist  Wheelchair 150 feet activity did not occur:  Safety/medical concerns       Blood pressure 118/72, pulse 74, temperature 97.9 F (36.6 C), resp. rate 16, height 5\' 5"  (1.651 m), weight 124 kg, SpO2 96 %.    Medical Problem List and Plan: 1.  Impaired mobility and strength secondary to R MCA infarct             -patient may shower             --Continue CIR therapies including PT, OT, and SLP   -left WHO ordered, PRAFO ordered today 2.  Antithrombotics: -DVT/anticoagulation:  Pharmaceutical: Lovenox             -antiplatelet therapy: ASA/Brillinta 3. Chronic R>L knee pain/Pain Management: will add Sportscreme prn. May need right knee sleeve for support.              Chronic right shoulder scapular dyskinesis--likely worsened by increased use due to flaccid left extremity. No real benefits with TPI's. Rhomboids most involved  -tizanidine has proven very helpful--continue 2mg  tid side effect permitting  -continue kpad  -Scapular ROM with therapies  -lidoderm patch Left calf pain likely tone related at noc should improve with AFO  4. Mood: LCSW to follow for evaluation and support.              -antipsychotic agents: N/A             -resumed wellbutrin per home dose  -on ritalin at home too ---consider resuming at some point  -neuropsych eval/treat would be helpful.  5. Neuropsych: This patient is capable of making decisions on her own behalf. 6. Skin/Wound Care: Routine pressure relief measures.  7. Fluids/Electrolytes/Nutrition: Monitor I/O. Check lytes in am.  8. Chronic HA: Takes goody powders/Motrin/Ultram. Continue tylenol prn.   - low dose topamax added--continue for now 9. T2DM: Hgb A1c-8.0.  Monitor diet.   Resumed home dose metformin and Victoza    -cbg's ok with decrease in levemir. Will stop levemir tonight    11. H/o band failure/gastric sleeve: Continue multivitamin. Appetite has been good.  12.  Sinus pressure: Felt to be due to allergies. Resumed Flonase and allegra to help manage symptoms. Resume allergy  injections after discharge.   LOS: 4 days A FACE TO FACE EVALUATION WAS PERFORMED  Kathryn Coffey 07/26/2019, 8:06 AM

## 2019-07-26 NOTE — Progress Notes (Signed)
Orthopedic Tech Progress Note Patient Details:  Kathryn Coffey Oct 26, 1970 [id number]  Ortho Devices Type of Ortho Device: Prafo boot/shoe Ortho Device/Splint Location: LLE Ortho Device/Splint Interventions: Application, Ordered   Post Interventions Patient Tolerated: Well Instructions Provided: Care of device, Adjustment of device   Janit Pagan 07/26/2019, 9:38 AM

## 2019-07-26 NOTE — Progress Notes (Signed)
   07/26/19 1900  What Happened  Was fall witnessed? Yes  Who witnessed fall? Viri NT  Patients activity before fall bathroom-assisted  Point of contact other (comment) (body)  Was patient injured? No  Follow Up  MD notified Dr. Letta Pate   Time MD notified 1910  Family notified Yes - comment (husband with patient in room)  Time family notified 1900  Additional tests No  Simple treatment Other (comment)  Progress note created (see row info) Yes  Adult Fall Risk Assessment  Risk Factor Category (scoring not indicated) High fall risk per protocol (document High fall risk)  Patient Fall Risk Level High fall risk  Adult Fall Risk Interventions  Required Bundle Interventions *See Row Information* High fall risk - low, moderate, and high requirements implemented  Additional Interventions Other (Comment)  Screening for Fall Injury Risk (To be completed on HIGH fall risk patients) - Assessing Need for Low Bed  Risk For Fall Injury- Low Bed Criteria None identified - Continue screening  Will Implement Low Bed and Floor Mats Low bed contraindicated, floor mats in place  Specialty Low Bed Contraindicated Requires therapeutic low air loss mattress  Pain Assessment  Pain Scale 0-10  Pain Score 0  Neurological  Neuro (WDL) X (right mca)  Level of Consciousness Alert  Orientation Level Oriented X4  Cognition Appropriate at baseline  Speech Clear  Pupil Assessment  No  R Hand Grip Moderate  L Hand Grip Absent   RUE Motor Response Purposeful movement  RUE Sensation Full sensation  RUE Motor Strength 5  LUE Motor Response Non-purposeful movement  LUE Sensation Decreased  LUE Motor Strength 0  RLE Motor Response Purposeful movement  RLE Sensation Full sensation  RLE Motor Strength 4  LLE Motor Response Purposeful movement  LLE Sensation Decreased  LLE Motor Strength 3  Neuro symptoms relieved by Rest  Musculoskeletal  Musculoskeletal (WDL) X  Assistive Device Wheelchair   Generalized Weakness Yes  Weight Bearing Restrictions No  Musculoskeletal Details  LUE Paralysis  LLE Weakness  Integumentary  Integumentary (WDL) WDL  Skin Color Appropriate for ethnicity  Skin Condition Dry  Skin Integrity Intact;Ecchymosis  Ecchymosis Location Abdomen  Ecchymosis Location Orientation Right;Left  Ecchymosis Intervention Other (Comment)  Pain Assessment  Date Pain First Started  (no pain)  Result of Injury No  Pain Assessment  Work-Related Injury No

## 2019-07-26 NOTE — Progress Notes (Signed)
Occupational Therapy Session Note  Patient Details  Name: Kathryn Coffey MRN: [id number] Date of Birth: [birth year]  Today's Date: 07/26/2019 OT Individual Time: 1040-1140 OT Individual Time Calculation (min): 60 min    Short Term Goals: Week 1:  OT Short Term Goal 1 (Week 1): Pt will manage LUE prior to transfers with no VC to demo improved L attention OT Short Term Goal 2 (Week 1): Pt will consistently transfer to toilet with MIN A OT Short Term Goal 3 (Week 1): Pt will don shirt wiht MIN A  Skilled Therapeutic Interventions/Progress Updates: participation as follows:  For donning blouse, Patient had difficulty with topographical orientation and hemi techniques after 3 demonstrations.   She required tactile and verbal cues for visual processing, comprehension, topographical orientation and motor planning.     UB donning of blouse = moderate assistance extratime for processing and completing task;    peribathing standing at sink due to urine soaked brief =maximum assist.  Patient unable to reach front periarea due to girth and minimally wash right buttock with one swipe though she could reach it.   (left UE with hemiparesis and not functional for this task) issued long handled sponge to patient to ease access for cleansing periarea and buttocks  LB donning of shorts.   Required minA to maintain figure 4 crossing left lower leg over right to don pants over weaker left lower extremity and then once standing required minimal verbal cues to attend to puling up pants over left hip.  For sit to stand in prep for self care and transfers, patient required cues to scooot forward to front of surface to ease the sit to stand movement and rquired cues to push up from surface rather than grab for sink, walker or caregiver.  Sit to stand= cues and CGA to close S  Static unsupported sitting balance this session= distant S Dynamic standing balance= CGA as patient tended required cues to bear weight  through LLE    At end of session, patient was asssisted to recliner as requested (CGA for stand step transfers to her right.  She maintained L ankle integrity as she took her time to be turn left foot during transfer).  She was left with her husband nearby and call bell within reach.  Continue OT Plan of Care      Therapy Documentation Precautions:  Precautions Precautions: Fall Precaution Comments: left hemiparesis. L inattention Restrictions Weight Bearing Restrictions: No Pain: denied     Therapy/Group: Individual Therapy  Alfredia Ferguson [facility] 07/26/2019, 12:48 PM

## 2019-07-26 NOTE — Progress Notes (Signed)
Physical Therapy Session Note  Patient Details  Name: Kathryn Coffey MRN: [id number] Date of Birth: 05/28/1971  Today's Date: 07/26/2019 PT Individual Time: 0738-0902 PT Individual Time Calculation (min): 84 min   Short Term Goals: Week 1:  PT Short Term Goal 1 (Week 1): Pt will consistently perform bed mobility with mod assist PT Short Term Goal 2 (Week 1): Pt will ambulate >58ft with mod assist and LRAD PT Short Term Goal 3 (Week 1): Pt will propell WC 12ft with min assist PT Short Term Goal 4 (Week 1): Pt will initiate stair training.  Skilled Therapeutic Interventions/Progress Updates:  Pt received in bathroom in handoff from NT. Pt with continent BM & void & performs peri hygiene without assistance. Pt transfers to w/c via stand pivot with min assist & performs hand hygiene at sink from w/c level with set up assist. Transported pt to gym via w/c dependent assist & therapist dons B shoes, L air cast & L AFO. Pt transfers sit>stand with min assist & ambulates 10 ft + 10 ft with RW & min assist with initially L foot external rotation then with slightly more neutral alignment but L ankle inverting and pt impulsive with movements requiring cuing for safety. Discussed AFO consult for Monday with [facility] orthotist) and benefits of AFO consult, as at this time, pt with impaired control of L ankle and hesitant to progress gait so as not to have L ankle injury. Chris plans to come during [place] PT session for AFO consult. In dayroom, pt transfers w/c<>kinetron via stand pivot with min assist. Pt reports significant fatigue & slight dizziness with sit>stand, BP = 123/74 mmHg (LUE, sitting), HR = 82 bpm. Pt utilized kinetron in sitting 1 minute x 4 bouts with task focusing on LLE strengthening & NMR. Pt propelled w/c dayroom>gym with BLE & min assist with min assist for attention to L side of environment with max cuing for technique & to advance LLE with task focusing on LLE strengthening & NMR. Pt  performed 5x sit<>stand x 2 sets without BUE support & min assist with cuing for technique with task focusing on BLE strengthening with pt demonstrating increased weight shift to R in standing during activity. Pt returned to room and transferred w/c>bed via stand pivot min assist and sit>suipne with min assist for LLE. Pt left in bed with alarm set & all needs at hand.  Therapy Documentation Precautions:  Precautions Precautions: Fall Precaution Comments: left hemiparesis. L inattention Restrictions Weight Bearing Restrictions: No  Pain: Pt reports R scapular pain while sitting EOM, so assisted pt to sit in w/c with back support for increased comfort.  Therapy/Group: Individual Therapy  Waunita Schooner 07/26/2019, 9:07 AM

## 2019-07-26 NOTE — Progress Notes (Signed)
Speech Language Pathology Daily Session Note  Patient Details  Name: ORQUIDIA FRYDRYCH MRN: [id number] Date of Birth: September 11, 1970  Today's Date: 07/26/2019 SLP Individual Time: [id number] SLP Individual Time Calculation (min): 52 min  Short Term Goals: Week 1: SLP Short Term Goal 1 (Week 1): Patient will demonstrate functional problem solving for complex tasks with Mod I. SLP Short Term Goal 2 (Week 1): Patient will demonstrate selective attention to a task in a mildly distracting enviornment for 20 minutes with supervision level verbal cues for redirection. SLP Short Term Goal 3 (Week 1): Patient will attend/scan to left field of enviornment during functional tasks with Mod verbal cues. SLP Short Term Goal 4 (Week 1): Patient will consume current diet without overt s/s of aspiration and overall Mod I for use of swallowing compensatory strategies. SLP Short Term Goal 5 (Week 1): Patient will demonstrate efficient mastication with complete oral clearance with trials of regular textures without overt s/s of aspiration over 2 sessions prior to upgrade.  Skilled Therapeutic Interventions:  Skilled treatment session focused on cognition goals. SLP facilitated session by providing 2 complex Christmas crafts for pt to complete. Pt instructed to alternate between the 2 task every 5 minutes then changed to every 10 minutes. Pt independently able to complete hand-drawn cards as well as navigate Word document to create festive signage. Pt was independent with alerting SLP to change tasks. Pt left in recliner with her husband present.      Pain    Therapy/Group: Individual Therapy  Marcelyn Ruppe 07/26/2019, 2:48 PM

## 2019-07-26 NOTE — Progress Notes (Signed)
Orthopedic Tech Progress Note Patient Details:  SOVANNA LUQUETTE 1971/04/11 [id number] Called in order to [facility] for an AFO CONSULT Patient ID: RHONDI NOLD, female   DOB: 1970-10-27, 48 y.o.   MRN: [id number]   Janit Pagan 07/26/2019, 9:24 AM

## 2019-07-27 ENCOUNTER — Inpatient Hospital Stay (HOSPITAL_COMMUNITY): Payer: No Typology Code available for payment source

## 2019-07-27 LAB — GLUCOSE, CAPILLARY
Glucose-Capillary: 87 mg/dL (ref 70–99)
Glucose-Capillary: 86 mg/dL (ref 70–99)
Glucose-Capillary: 109 mg/dL — ABNORMAL HIGH (ref 70–99)
Glucose-Capillary: 112 mg/dL — ABNORMAL HIGH (ref 70–99)

## 2019-07-27 NOTE — Progress Notes (Addendum)
Physical Therapy Session Note  Patient Details  Name: Kathryn Coffey MRN: [id number] Date of Birth: [birth year]  Today's Date: 07/27/2019 PT Individual Time: [id number] PT Individual Time Calculation (min): 90 min   Short Term Goals: Week 1:  PT Short Term Goal 1 (Week 1): Pt will consistently perform bed mobility with mod assist PT Short Term Goal 2 (Week 1): Pt will ambulate >22ft with mod assist and LRAD PT Short Term Goal 3 (Week 1): Pt will propell WC 141ft with min assist PT Short Term Goal 4 (Week 1): Pt will initiate stair training.  Skilled Therapeutic Interventions/Progress Updates:     Patient in bed upon PT arrival. Patient alert and agreeable to PT session. Patient denied pain, reported feeling "swimy headed" from taking Keflex prior to session, RN made aware. Patient's husband present for last 30 min of PT session and participated in hands on training and education.   Therapeutic Activity: Bed Mobility: Patient performed supine to/from sit x1 with PT assist and x2 with her husband assisting with min A in a flat bed with min [place] of bed rails. Provided verbal cues for sliding LEs off the bed then pushing throguth UEs to sit up. Corrected patient and her husbands technique after first trial together when patient pulling up on her husband to sit up. Educated on proper body mechanics and back safety with lifting assist and provided cues for patient pushing up with her R UE with husband providing min A for trunk support for patient to sit up. The demonstrated proper technique on second trial. Transfers: Patient performed sit to/from stand x5 perpendicular to 1 // bar and stand pivot x1 with PT and x2 with her husband assisting with min A for safety/balance. Provided verbal cues for Kathryn foot placement to reduce foot inversion and set up for w/c and recliner to reduce transfer distance for safe transfers. Patient and her husband demonstrated safe technique during both transfers. Cleared patient  to transfer in the room with her husband, RN in agreement and safety sheet updated. Educated on wearing non-skid socks in the room during transfers and informing nursing staff when her husband leaves to reset alarms when patient is alone. Patient and her husband receptive and in agreement with all education.   Wheelchair Mobility:  Patient was transported in the w/c with total A throughout session for energy conservation and time management.  Neuromuscular Re-ed: Patient performed standing balance x5 forward facing 1 // bar holding on with her R hand. Place a quarter folded wash cloth with dyson under the lateral side of her Kathryn foot to promote ankle eversion in standing on trials 2-5.  Trial 1: standing balance with weight shifts to midline x1 min Trial 2: standing balance with weight shifts R and Kathryn with multi-modal cues for B knee control to reduce recurvatum x2 min Trial 3-4: standing balance with R foot placed on // bar platform for ~2" step up 1-2 min to promote Kathryn weight bearing with multi-modal cues for Kathryn knee control to prevent recurvatum. Progressed to gastroc stretch on Kathryn during second trial Trial 5: standing balance while performing mini squats with multi modal cues for B knee control and equal weight bearing Kathryn knee extension in sitting 2x5 with cues for controlled motion Kathryn shoulder elevation, protraction, retraction x8 each direction Kathryn elbow flexion/extension with PNF tapping to biceps and triceps with cues for increased visualization of motion during PROM  Patient in recliner with her husband in the room at end of session  with breaks locked and all needs within reach. Patient and her husband had several questions at end of session. PT provided education on inpatient rehab schedule, different therapeutic disciplines, general stroke education and stroke recovery, stroke symptoms, tone, emotional lability, depression and coping, POC per chart review, and team conference.     Therapy  Documentation Precautions:  Precautions Precautions: Fall Precaution Comments: left hemiparesis. Kathryn inattention Restrictions Weight Bearing Restrictions: No    Therapy/Group: Individual Therapy  Kathryn Coffey Kathryn Coffey PT, DPT  07/27/2019, 12:45 PM

## 2019-07-27 NOTE — Progress Notes (Addendum)
[facility] PHYSICAL MEDICINE & REHABILITATION PROGRESS NOTE   Subjective/Complaints: Assisted fall last pm no injury , states she thought her husband was on her left but he stepped out for wash cloth  Slid off toilet  ROS: Patient denies , nausea, vomiting, diarrhea, cough, shortness of breath or chest pain   Objective:   No results found. No results for input(s): WBC, HGB, HCT, PLT in the last 72 hours. No results for input(s): NA, K, CL, CO2, GLUCOSE, BUN, CREATININE, CALCIUM in the last 72 hours.  Intake/Output Summary (Last 24 hours) at 07/27/2019 0906 Last data filed at 07/27/2019 0758 Gross per 24 hour  Intake 240 ml  Output -  Net 240 ml     Physical Exam: Vital Signs Blood pressure 131/86, pulse 66, temperature 97.8 F (36.6 C), resp. rate 16, height 5\' 5"  (1.651 m), weight 124 kg, SpO2 98 %. Constitutional: No distress . Vital signs reviewed. HEENT: EOMI, oral membranes moist Neck: supple Cardiovascular: RRR without murmur. No JVD    Respiratory: CTA Bilaterally without wheezes or rales. Normal effort    GI: BS +, non-tender, non-distended  Skin: intact Neuro: AOx3. Left facial droop. CN 2-12 otherwise intact.  Sensation 1+/2 LUE and LLE 5/5 strength on right side. LUE: 0/5 prox to distal--no change.  LLE: 3- HF, KE, ADF - no change  Musc: right shoulder/scapula moving much more freely. ABD/ER/IR with minimal pain this morning. No palpable trigger points   Psych: pleasant  Assessment/Plan: 1. Functional deficits secondary to acute ischemic right MCA stroke which require 3+ hours per day of interdisciplinary therapy in a comprehensive inpatient rehab setting.  Physiatrist is providing close team supervision and 24 hour management of active medical problems listed below.  Physiatrist and rehab team continue to assess barriers to discharge/monitor patient progress toward functional and medical goals  Care Tool:  Bathing    Body parts bathed by patient: Chest,  Abdomen, Right upper leg, Left upper leg, Right lower leg, Face   Body parts bathed by helper: Front perineal area, Buttocks, Left lower leg, Left arm, Right lower leg     Bathing assist Assist Level: Moderate Assistance - Patient 50 - 74%     Upper Body Dressing/Undressing Upper body dressing   What is the patient wearing?: Bra, Pull over shirt    Upper body assist Assist Level: Moderate Assistance - Patient 50 - 74%    Lower Body Dressing/Undressing Lower body dressing      What is the patient wearing?: Pants, Incontinence brief     Lower body assist Assist for lower body dressing: Maximal Assistance - Patient 25 - 49%     Toileting Toileting    Toileting assist Assist for toileting: Maximal Assistance - Patient 25 - 49%     Transfers Chair/bed transfer  Transfers assist     Chair/bed transfer assist level: Minimal Assistance - Patient > 75%     Locomotion Ambulation   Ambulation assist      Assist level: Minimal Assistance - Patient > 75% Assistive device: Walker-rolling(orthosis, air cast) Max distance: 10 ft   Walk 10 feet activity   Assist     Assist level: Minimal Assistance - Patient > 75% Assistive device: Walker-rolling, Orthosis(orthosis, air cast)   Walk 50 feet activity   Assist Walk 50 feet with 2 turns activity did not occur: Safety/medical concerns         Walk 150 feet activity   Assist Walk 150 feet activity did not occur: Safety/medical  concerns         Walk 10 feet on uneven surface  activity   Assist Walk 10 feet on uneven surfaces activity did not occur: Safety/medical concerns         Wheelchair     Assist Will patient use wheelchair at discharge?: Yes Type of Wheelchair: Manual    Wheelchair assist level: Minimal Assistance - Patient > 75% Max wheelchair distance: 75 ft    Wheelchair 50 feet with 2 turns activity    Assist        Assist Level: Minimal Assistance - Patient > 75%    Wheelchair 150 feet activity     Assist  Wheelchair 150 feet activity did not occur: Safety/medical concerns       Blood pressure 131/86, pulse 66, temperature 97.8 F (36.6 C), resp. rate 16, height 5\' 5"  (1.651 m), weight 124 kg, SpO2 98 %.    Medical Problem List and Plan: 1.  Impaired mobility and strength secondary to R MCA infarct             -patient may shower             --Continue CIR therapies including PT, OT, and SLP   -left WHO ordered, PRAFO ordered today 2.  Antithrombotics: -DVT/anticoagulation:  Pharmaceutical: Lovenox             -antiplatelet therapy: ASA/Brillinta 3. Chronic R>L knee pain/Pain Management: will add Sportscreme prn. May need right knee sleeve for support.              Chronic right shoulder scapular dyskinesis--likely worsened by increased use due to flaccid left extremity. No real benefits with TPI's. Rhomboids most involved  -tizanidine has proven very helpful--continue 2mg  tid side effect permitting  -continue kpad  -Scapular ROM with therapies  -lidoderm patch Left calf pain likely tone related at noc should improve with AFO  4. Mood: LCSW to follow for evaluation and support.              -antipsychotic agents: N/A             -resumed wellbutrin per home dose  -on ritalin at home too ---consider resuming at some point  -neuropsych eval/treat would be helpful.  5. Neuropsych: This patient is capable of making decisions on her own behalf. 6. Skin/Wound Care: Routine pressure relief measures.  7. Fluids/Electrolytes/Nutrition: Monitor I/O. Check lytes in am.  8. Chronic HA: Takes goody powders/Motrin/Ultram. Continue tylenol prn.   - low dose topamax added--continue for now 9. T2DM: Hgb A1c-8.0.  Monitor diet.   Resumed home dose metformin and Victoza    -cbg's ok with decrease in levemir. Will stop levemir tonight    11. H/o band failure/gastric sleeve: Continue multivitamin. Appetite has been good.  12.  Sinus pressure: Felt to  be due to allergies. Resumed Flonase and allegra to help manage symptoms. Resume allergy injections after discharge.   LOS: 5 days A FACE TO FACE EVALUATION WAS PERFORMED  Charlett Blake 07/27/2019, 9:06 AM

## 2019-07-28 ENCOUNTER — Inpatient Hospital Stay (HOSPITAL_COMMUNITY): Payer: No Typology Code available for payment source | Admitting: Speech Pathology

## 2019-07-28 ENCOUNTER — Inpatient Hospital Stay (HOSPITAL_COMMUNITY): Payer: No Typology Code available for payment source | Admitting: Physical Therapy

## 2019-07-28 ENCOUNTER — Encounter (HOSPITAL_COMMUNITY): Payer: No Typology Code available for payment source | Admitting: Psychology

## 2019-07-28 ENCOUNTER — Inpatient Hospital Stay (HOSPITAL_COMMUNITY): Payer: No Typology Code available for payment source | Admitting: Occupational Therapy

## 2019-07-28 LAB — CBC
HCT: 30.6 % — ABNORMAL LOW (ref 36.0–46.0)
Hemoglobin: 9.3 g/dL — ABNORMAL LOW (ref 12.0–15.0)
MCH: 23.4 pg — ABNORMAL LOW (ref 26.0–34.0)
MCHC: 30.4 g/dL (ref 30.0–36.0)
MCV: 76.9 fL — ABNORMAL LOW (ref 80.0–100.0)
Platelets: 571 10*3/uL — ABNORMAL HIGH (ref 150–400)
RBC: 3.98 MIL/uL (ref 3.87–5.11)
RDW: 17.8 % — ABNORMAL HIGH (ref 11.5–15.5)
WBC: 11 10*3/uL — ABNORMAL HIGH (ref 4.0–10.5)
nRBC: 0 % (ref 0.0–0.2)

## 2019-07-28 LAB — BASIC METABOLIC PANEL
Anion gap: 11 (ref 5–15)
BUN: 16 mg/dL (ref 6–20)
CO2: 22 mmol/L (ref 22–32)
Calcium: 8.9 mg/dL (ref 8.9–10.3)
Chloride: 106 mmol/L (ref 98–111)
Creatinine, Ser: 0.64 mg/dL (ref 0.44–1.00)
GFR calc Af Amer: >60 mL/min (ref >60)
GFR calc non Af Amer: >60 mL/min (ref >60)
Glucose, Bld: 102 mg/dL — ABNORMAL HIGH (ref 70–99)
Potassium: 3.6 mmol/L (ref 3.5–5.1)
Sodium: 139 mmol/L (ref 135–145)

## 2019-07-28 LAB — GLUCOSE, CAPILLARY
Glucose-Capillary: 95 mg/dL (ref 70–99)
Glucose-Capillary: 83 mg/dL (ref 70–99)
Glucose-Capillary: 99 mg/dL (ref 70–99)
Glucose-Capillary: 90 mg/dL (ref 70–99)

## 2019-07-28 MED ORDER — TIZANIDINE HCL 2 MG PO TABS
2.0000 mg | ORAL_TABLET | Freq: Every day | ORAL | Status: DC
  Administered 2019-07-29 – 2019-08-05 (×8): 2 mg via ORAL
  Filled 2019-07-28 (×9): qty 1

## 2019-07-28 NOTE — Progress Notes (Signed)
Physical Therapy Session Note  Patient Details  Name: Kathryn Coffey MRN: [id number] Date of Birth: 15-Feb-1971  Today's Date: 07/28/2019 PT Individual Time: 1000-1055 AND 1300-1330 PT Individual Time Calculation (min): 55 min AND 30 min  Short Term Goals: Week 1:  PT Short Term Goal 1 (Week 1): Pt will consistently perform bed mobility with mod assist PT Short Term Goal 2 (Week 1): Pt will ambulate >44f with mod assist and LRAD PT Short Term Goal 3 (Week 1): Pt will propell WC 1049fwith min assist PT Short Term Goal 4 (Week 1): Pt will initiate stair training.  Skilled Therapeutic Interventions/Progress Updates:   Session 1:  Pt in supine and agreeable to therapy, denies pain. Pt reported her brief was soiled of urine. Supine>sit w/ min assist and sit<>stands to RW w/ min assist while therapist removed brief. Pt's brief was full and bed linens were also soaked. Donned new brief total assist in stance and removed bed linens, stand pivot to w/c w/ min-mod assist using RW. Donned pants, shirt, and socks in seated position. Verbal, tactile, and visual cues for hemi dressing technique, needed mod-max assist overall for dressing. Pt self-propelled w/c to therapy gym w/ supervision via R hemi technique. Worked on sit<>stands at rail w/ min assist to stand and min-mod assist to maintain standing for pre-gait tasks. Performed blocked practice of lateral weight shifting in stance w/ emphasis on L knee control w/ weight acceptance. Tactile and verbal cues to maintain slight knee flexion. Min assist overall for L knee flexion. Pt either would hyperextend knee for stability or buckle. Max encouragement for her to trust her L leg and therapist to support her w/ weight shifting. Once pt was performing adequate L weight shifting, added in R forward and backward stepping. Pt w/ fluid and controlled steps, pt very encouraged by this as she didn't think her L leg would support her. Mirror for visual feedback  throughout pre-gait tasks. Returned to room total assist via w/c. Ended session in w/c and in care of husband, all needs met.   Session 2:  Pt in w/c and agreeable to therapy, no c/o pain. Total assist w/c transport to/from therapy gym. Worked on gaPersonnel officer/ orthotist present. Pt w/ improved medial/lateral ankle stability today vs previous therapy sessions over weekend. Ambulated in 4-5 bouts w/ R rail, 10-15' at a time w/ min-mod assist for upright balance and min assist for LLE management. Most assist needed to block L knee and for L step placement. Added in L GRAFO and pt needed no assist to block L knee, still needed occasional assist for L step placement. No hyperextension noted with this AFO, pt did have a few bouts w/o any AFO on. Orthotist recommending just AFO to help w/ L foot clearance and knee stability, recommend pt wear neoprene sleeves on knees if they improve pain. Orthotist to bring 2 different GRAFOs to trial during tomorrow's PT session. Total assist w/c transport back to room, ended session in supine and all needs in reach.   Therapy Documentation Precautions:  Precautions Precautions: Fall Precaution Comments: left hemiparesis. L inattention Restrictions Weight Bearing Restrictions: No Pain: Pain Assessment Pain Scale: Faces Pain Score: 0-No pain Faces Pain Scale: No hurt  Therapy/Group: Individual Therapy  Shakia Sebastiano K Ronnett Pullin 07/28/2019, 11:00 AM

## 2019-07-28 NOTE — Progress Notes (Addendum)
[place] [facility] PHYSICAL MEDICINE & REHABILITATION PROGRESS NOTE   Subjective/Complaints: Had a fair night. Says tizanidine helps but makes her sleepy. PRAFO uncomfortable to wear at HS  ROS: Patient denies fever, rash, sore throat, blurred vision, nausea, vomiting, diarrhea, cough, shortness of breath or chest pain,  headache, or mood change.    Objective:   No results found. Recent Labs    07/28/19 0626  WBC 11.0*  HGB 9.3*  HCT 30.6*  PLT 571*   Recent Labs    07/28/19 0626  NA 139  K 3.6  CL 106  CO2 22  GLUCOSE 102*  BUN 16  CREATININE 0.64  CALCIUM 8.9    Intake/Output Summary (Last 24 hours) at 07/28/2019 0917 Last data filed at 07/28/2019 0836 Gross per 24 hour  Intake 350 ml  Output -  Net 350 ml     Physical Exam: Vital Signs Blood pressure 124/90, pulse 89, temperature (!) 97.5 F (36.4 C), temperature source Oral, resp. rate 18, height 5\' 5"  (1.651 m), weight 124 kg, SpO2 99 %. Constitutional: No distress . Vital signs reviewed. HEENT: EOMI, oral membranes moist Neck: supple Cardiovascular: RRR without murmur. No JVD    Respiratory: CTA Bilaterally without wheezes or rales. Normal effort    GI: BS +, non-tender, non-distended  Skin: intact Neuro: AOx3. Left facial droop. CN 2-12 otherwise intact.  Sensation 1+/2 LUE and LLE 5/5 strength on right side. LUE: 1/5 pec/bicep.  LLE: 3- HF, KE, ADF - no change  Musc: improved right shoulder ROM Psych: pleasant  Assessment/Plan: 1. Functional deficits secondary to acute ischemic right MCA stroke which require 3+ hours per day of interdisciplinary therapy in a comprehensive inpatient rehab setting.  Physiatrist is providing close team supervision and 24 hour management of active medical problems listed below.  Physiatrist and rehab team continue to assess barriers to discharge/monitor patient progress toward functional and medical goals  Care Tool:  Bathing    Body parts bathed by patient: Chest,  Abdomen, Right upper leg, Left upper leg, Right lower leg, Face   Body parts bathed by helper: Front perineal area, Buttocks, Left lower leg, Left arm, Right lower leg     Bathing assist Assist Level: Moderate Assistance - Patient 50 - 74%     Upper Body Dressing/Undressing Upper body dressing   What is the patient wearing?: Bra, Pull over shirt    Upper body assist Assist Level: Moderate Assistance - Patient 50 - 74%    Lower Body Dressing/Undressing Lower body dressing      What is the patient wearing?: Pants, Incontinence brief     Lower body assist Assist for lower body dressing: Maximal Assistance - Patient 25 - 49%     Toileting Toileting    Toileting assist Assist for toileting: Maximal Assistance - Patient 25 - 49%     Transfers Chair/bed transfer  Transfers assist     Chair/bed transfer assist level: Minimal Assistance - Patient > 75%     Locomotion Ambulation   Ambulation assist      Assist level: Minimal Assistance - Patient > 75% Assistive device: Walker-rolling(orthosis, air cast) Max distance: 10 ft   Walk 10 feet activity   Assist     Assist level: Minimal Assistance - Patient > 75% Assistive device: Walker-rolling, Orthosis(orthosis, air cast)   Walk 50 feet activity   Assist Walk 50 feet with 2 turns activity did not occur: Safety/medical concerns         Walk 150 feet  activity   Assist Walk 150 feet activity did not occur: Safety/medical concerns         Walk 10 feet on uneven surface  activity   Assist Walk 10 feet on uneven surfaces activity did not occur: Safety/medical concerns         Wheelchair     Assist Will patient use wheelchair at discharge?: Yes Type of Wheelchair: Manual    Wheelchair assist level: Minimal Assistance - Patient > 75% Max wheelchair distance: 75 ft    Wheelchair 50 feet with 2 turns activity    Assist        Assist Level: Minimal Assistance - Patient > 75%    Wheelchair 150 feet activity     Assist  Wheelchair 150 feet activity did not occur: Safety/medical concerns       Blood pressure 124/90, pulse 89, temperature (!) 97.5 F (36.4 C), temperature source Oral, resp. rate 18, height 5\' 5"  (1.651 m), weight 124 kg, SpO2 99 %.    Medical Problem List and Plan: 1.  Impaired mobility and strength secondary to R MCA infarct             -patient may shower             --Continue CIR therapies including PT, OT, and SLP   -left WHO ordered, can wear PRAFO as tolerated 2.  Antithrombotics: -DVT/anticoagulation:  Pharmaceutical: Lovenox             -antiplatelet therapy: ASA/Brillinta 3. Chronic R>L knee pain/Pain Management: will add Sportscreme prn. May need right knee sleeve for support.              Chronic right shoulder scapular dyskinesis--likely worsened by increased use due to flaccid left extremity. No real benefits with TPI's. Rhomboids most involved  -tizanidine has proven very helpful--reduce to QHS to limit sedation  -continue kpad  -Scapular ROM with therapies  -lidoderm patch  Left calf pain d/t tone?--PRAFO as tolerated 4. Mood: LCSW to follow for evaluation and support.              -antipsychotic agents: N/A             -resumed wellbutrin per home dose  -on ritalin at home too ---consider resuming at some point  -neuropsych eval pending.  5. Neuropsych: This patient is capable of making decisions on her own behalf. 6. Skin/Wound Care: Routine pressure relief measures.  7. Fluids/Electrolytes/Nutrition: Monitor I/O.   -I personally reviewed the patient's labs today.  .  8. Chronic HA: Takes goody powders/Motrin/Ultram. Continue tylenol prn.   - low dose topamax added--continue for now 9. T2DM: Hgb A1c-8.0.  Monitor diet.   Resumed home dose metformin and Victoza    -cbg's ok off levemir    11. H/o band failure/gastric sleeve: Continue multivitamin. Appetite has been good.  12.  Sinus pressure: Felt to be due to  allergies. Resumed Flonase and allegra to help manage symptoms. Resume allergy injections after discharge.   LOS: 6 days A FACE TO FACE EVALUATION WAS PERFORMED  Meredith Staggers 07/28/2019, 9:17 AM

## 2019-07-28 NOTE — Progress Notes (Signed)
Social Work Assessment and Plan   Patient Details  Name: Kathryn Coffey MRN: [id number] Date of Birth: 1970/12/11  Today's Date: 07/24/2019  Problem List:  Patient Active Problem List   Diagnosis Date Noted  . Acute ischemic right middle cerebral artery (MCA) stroke ([place]) 07/22/2019  . Carotid artery dissection  (HCC) s/p stent placement 07/21/2019  . Diabetes mellitus type II, uncontrolled ([place]) 07/21/2019  . Acute blood loss anemia 07/21/2019  . Leukocytosis 07/21/2019  . Stroke (cerebrum) ([place]) - R MCA infarct s/p tenecteplase and mechanical thrombectomy w/ d/t ICA dissection  07/15/2019  . Middle cerebral artery embolism, right 07/15/2019  . Controlled type 2 diabetes mellitus without complication, without long-term current use of insulin ([place]) 05/28/2017  . Attention deficit hyperactivity disorder (ADHD), combined type 04/18/2017  . Morbid obesity ([place]) 01/25/2016  . Umbilical hernia [id number]  . Hiatal hernia 12/08/2015  . History of Clostridium difficile colitis 06/19/2014  . HTN (hypertension) 12/23/2013  . PCOS (polycystic ovarian syndrome) 12/23/2013  . Bariatric surgery status 01/28/2013   Past Medical History:  Past Medical History:  Diagnosis Date  . Abdominal wall cellulitis 2013 and 2014  . Asthma    allergic to grasses  . B12 deficiency   . Complication of anesthesia   . Costochondritis   . Diabetes mellitus without complication ([place])   . Gastric anomaly    multiple small ulcers  . GERD (gastroesophageal reflux disease)   . Herpes 06/2018   POSSIBLY IN EYE- PT TAKING VALTREX AND WILL SEE OPTHAMOLOGIST TO SEE IF VALTREX IS WORKING  . History of abnormal cervical Pap smear   . History of Clostridium difficile colitis   . History of hiatal hernia   . History of kidney stones   . Hypertension   . IBS (irritable bowel syndrome)   . Migraine   . Morbid obesity ([place])    s/p attempted gastric banding now decompressed  . PCOS (polycystic ovarian syndrome)    . PONV (postoperative nausea and vomiting)    WITH SPINAL ONLY   Past Surgical History:  Past Surgical History:  Procedure Laterality Date  . BREAST EXCISIONAL BIOPSY Left    cyst excision - Dr. Patty Sermons  . CESAREAN SECTION    . CHOLECYSTECTOMY    . COLONOSCOPY    . EAR CYST EXCISION Right 07/04/2018   Procedure: EXCISION GLOMUS TUMOR THUMBNAIL;  Surgeon: Corky Mull, MD;  Location: [facility] ORS;  Service: Orthopedics;  Laterality: Right;  . ESOPHAGOGASTRODUODENOSCOPY    . ESOPHAGOGASTRODUODENOSCOPY (EGD) WITH PROPOFOL N/A 12/06/2015   Procedure: ESOPHAGOGASTRODUODENOSCOPY (EGD) WITH PROPOFOL;  Surgeon: Manya Silvas, MD;  Location: [facility] ENDOSCOPY;  Service: Endoscopy;  Laterality: N/A;  . IR ANGIO INTRA EXTRACRAN SEL INTERNAL CAROTID UNI L MOD SED  07/15/2019  . IR ANGIO VERTEBRAL SEL SUBCLAVIAN INNOMINATE UNI R MOD SED  07/15/2019  . IR CT HEAD LTD  07/15/2019  . IR INTRAVSC STENT CERV CAROTID W/O EMB-PROT MOD SED INC ANGIO  07/15/2019  . IR PERCUTANEOUS ART THROMBECTOMY/INFUSION INTRACRANIAL INC DIAG ANGIO  07/15/2019  . LAPAROSCOPIC GASTRIC BANDING    . LAPAROSCOPIC GASTRIC RESTRICTIVE DUODENAL PROCEDURE (DUODENAL SWITCH) N/A 01/25/2016   Procedure: LAPAROSCOPIC GASTRIC RESTRICTIVE DUODENAL PROCEDURE (DUODENAL SWITCH);  Surgeon: Ladora Daniel, MD;  Location: [facility] ORS;  Service: General;  Laterality: N/A;  . OVARY SURGERY     x2  . RADIOLOGY WITH ANESTHESIA N/A 07/15/2019   Procedure: IR WITH ANESTHESIA;  Surgeon: Luanne Bras, MD;  Location: [facility];  Service:  Radiology;  Laterality: N/A;  . TONSILLECTOMY    . TUBAL LIGATION    . UMBILICAL HERNIA REPAIR N/A 01/25/2016   Procedure: LAPAROSCOPIC UMBILICAL HERNIA;  Surgeon: Ladora Daniel, MD;  Location: [facility] ORS;  Service: General;  Laterality: N/A;  . UMBILICAL HERNIA REPAIR N/A 10/20/2016   Procedure: HERNIA REPAIR UMBILICAL ADULT;  Surgeon: Leonie Green, MD;  Location: [facility] ORS;  Service: General;  Laterality: N/A;    Social History:  reports that she has never smoked. She has never used smokeless tobacco. She reports current alcohol use. She reports that she does not use drugs.  Family / Support Systems Marital Status: Married Patient Roles: Spouse, Parent Spouse/Significant Other: spouse, Kortnei Basden @ [phone] Children: 2 sons in the home Anticipated Caregiver: husband Ability/Limitations of Caregiver: Min/Mod A Caregiver Availability: 24/7 Family Dynamics: Spouse at bedside and very supportive and encouraging.  He is prepared to provide any assistance needed.  Social History Preferred language: English Religion: Baptist Cultural Background: NA Read: Yes Write: Yes Employment Status: Employed Name of Employer: [employer] Return to Work Plans: Pt hopeful but concerned Public relations account executive Issues: None Guardian/Conservator: None - per MD, pt is capable of making decision son her own behalf.   Abuse/Neglect Abuse/Neglect Assessment Can Be Completed: Yes Physical Abuse: Denies Verbal Abuse: Denies Sexual Abuse: Denies Exploitation of patient/patient's resources: Denies Self-Neglect: Denies  Emotional Status Pt's affect, behavior and adjustment status: Pt sitting up in bed and spouse at bedside.  She is able to complete assessment interview without much difficulty, however, her attention is shortened and she moves through topics very quickly.  She is briefly tearful when she talks of her fears with this CVA and "I never thought this would be happening to me."  Pt would likely benefit from neuropsychology - will refer. Recent Psychosocial Issues: None Psychiatric History: None Substance Abuse History: None  Patient / Family Perceptions, Expectations & Goals Pt/Family understanding of illness & functional limitations: Pt and spouse with good, general understanding of her CVA and current functional limitations/ need for CIR. 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 Anticipated changes in roles/activities/participation: Return to work is questionable dependent on cognitive gains. Pt/family expectations/goals: "I just want to get into a bigger room."  [employer]: None Premorbid Home Care/DME Agencies: None Transportation available at discharge: yes Resource referrals recommended: Neuropsychology  Discharge Planning Living Arrangements: Spouse/significant other, Children Support Systems: Spouse/significant other, Children, Other relatives, Friends/neighbors Type of Residence: Private residence Insurance Resources: Multimedia programmer (specify)([employer]) Financial Resources: Employment Financial Screen Referred: No Living Expenses: Medical laboratory scientific officer Management: Spouse Does the patient have any problems obtaining your medications?: No Home Management: pt and family share responsibilities Patient/Family Preliminary Plans: Pt to return home with spouse and sons to assist. Expected length of stay: 14-18 days  Clinical Impression Unfortunate woman here following a CVA and with husband at bedside offering much support.  She is tearful at times, especially, as she notes she will be in the hospital over the holidays.  Neuropsychology to consult.  Spouse notes family prepared to provide 24/7 assistance.  Will follow for support and d/c planning needs.  Aiyanah Kalama 07/24/2019, 5:08 PM

## 2019-07-28 NOTE — Progress Notes (Signed)
Occupational Therapy Session Note  Patient Details  Name: Kathryn Coffey MRN: [id number] Date of Birth: [birth year]  Today's Date: 07/28/2019 OT Individual Time: [id number] OT Individual Time Calculation (min): 60 min    Short Term Goals: Week 1:  OT Short Term Goal 1 (Week 1): Pt will manage LUE prior to transfers with no VC to demo improved L attention OT Short Term Goal 2 (Week 1): Pt will consistently transfer to toilet with MIN A OT Short Term Goal 3 (Week 1): Pt will don shirt wiht MIN A  Skilled Therapeutic Interventions/Progress Updates:    Pt greeted semi-reclined in bed with eyes closed and husband present. Pt initially declines to get OOB, but with encouragement, agreeable to work on functional use of L UE. Applied 1:1 NMES to CH1 supraspinatus and middle deltoid to help approximate shoulder joint, and Ch 2 wrist extensors.  Ratio 1:1 Rate 35 pps Waveform- Asymmetric Ramp 1.0 Pulse 300 CH1 Intensity- 20  Duration -  15  CH2 Intensity- 26 Duration -  15  OT provided joint input through elbow and wrist to bring pt through elbow/wrist/hand shoulder ROM. Pt with improved scapula elevation/depression. OT asked pt if she needed to go to the bathroom, and pt stated " I already went." Pt had already been incontinent of urine in brief and soaked the bed. OT discussed incontinence with pt and she stated is just happens and she can't stop it. OT discussed timed toileting with pt and OT will discuss this with team during conference. Sit<>stand at EOB with min A, then pt needed assistance to get pants off of L side. OT handed pt a wash cloth and she could wash buttocks but unable to reach peri-area 2/2 body habitus. Reviewed hemi-dressing techniques with pt able to achieve figure 4 with min A on L side to thread new depends and clean pants. Min A sit<>stand and mod A to pull up pants. Mod A to then pivot over to wc so OT could change soiled bed linens. Mod A to pivot back to bed without  AD. Pt left semi-reclined in bed with spouse present, bed alarm on, and call bell in reach.  Therapy Documentation Precautions:  Precautions Precautions: Fall Precaution Comments: left hemiparesis. L inattention Restrictions Weight Bearing Restrictions: No Pain: Denies pain  Therapy/Group: Individual Therapy  Valma Cava 07/28/2019, 3:14 PM

## 2019-07-28 NOTE — Plan of Care (Signed)
  Problem: Consults Goal: RH STROKE PATIENT EDUCATION Description: See Patient Education module for education specifics  Outcome: Progressing   Problem: RH BOWEL ELIMINATION Goal: RH STG MANAGE BOWEL W/MEDICATION W/ASSISTANCE Description: STG Manage Bowel with Medication with [place]. Outcome: Progressing   Problem: RH SKIN INTEGRITY Goal: RH STG MAINTAIN SKIN INTEGRITY WITH ASSISTANCE Description: STG Maintain Skin Integrity With Mod I Assistance. Outcome: Progressing   Problem: RH SAFETY Goal: RH STG ADHERE TO SAFETY PRECAUTIONS W/ASSISTANCE/DEVICE Description: STG Adhere to Safety Precautions With Mod I Assistance/Device. Outcome: Progressing Goal: RH STG DECREASED RISK OF FALL WITH ASSISTANCE Description: STG Decreased Risk of Fall With Mod I Assistance. Outcome: Progressing   Problem: RH PAIN MANAGEMENT Goal: RH STG PAIN MANAGED AT OR BELOW PT'S PAIN GOAL Outcome: Progressing

## 2019-07-28 NOTE — Care Management (Signed)
[facility] Individual Statement of Services  Patient Name:  Kathryn Coffey  Date:  07/25/2019  Welcome to the [facility].  Our goal is to provide you with an individualized program based on your diagnosis and situation, designed to meet your specific needs.  With this comprehensive rehabilitation program, you will be expected to participate in at least 3 hours of rehabilitation therapies Monday-Friday, with modified therapy programming on the weekends.  Your rehabilitation program will include the following services:  Physical Therapy (PT), Occupational Therapy (OT), Speech Therapy (ST), 24 hour per day rehabilitation nursing, Therapeutic Recreaction (TR), Neuropsychology, Case Management (Social Worker), Rehabilitation Medicine, Nutrition Services and Pharmacy Services  Weekly team conferences will be held on Tuesdays to discuss your progress.  Your Social Worker will talk with you frequently to get your input and to update you on team discussions.  Team conferences with you and your family in attendance may also be held.  Expected length of stay: 17-21 days   Overall anticipated outcome: supervision/ contact guard  Depending on your progress and recovery, your program may change. Your Social Worker will coordinate services and will keep you informed of any changes. Your Social Worker's name and contact numbers are listed  below.  The following services may also be recommended but are not provided by the [facility] [place] will be made to provide these services after discharge if needed.  Arrangements include referral to agencies that provide these services.  Your insurance has been verified to be:  [employer] [facility] [employer] Your primary doctor is:  Sabra Heck  Pertinent information will be shared with your  doctor and your insurance company.  Social Worker:  [place], [place] or ([phone]   Information discussed with and copy given to patient by: Lennart Pall, 07/25/2019, 5:11 PM

## 2019-07-28 NOTE — Progress Notes (Signed)
Speech Language Pathology Daily Session Note  Patient Details  Name: Kathryn Coffey MRN: [id number] Date of Birth: 03-23-1971  Today's Date: 07/28/2019 SLP Individual Time: 0829-0926 SLP Individual Time Calculation (min): 57 min  Short Term Goals: Week 1: SLP Short Term Goal 1 (Week 1): Patient will demonstrate functional problem solving for complex tasks with Mod I. SLP Short Term Goal 2 (Week 1): Patient will demonstrate selective attention to a task in a mildly distracting enviornment for 20 minutes with supervision level verbal cues for redirection. SLP Short Term Goal 3 (Week 1): Patient will attend/scan to left field of enviornment during functional tasks with Mod verbal cues. SLP Short Term Goal 4 (Week 1): Patient will consume current diet without overt s/s of aspiration and overall Mod I for use of swallowing compensatory strategies. SLP Short Term Goal 5 (Week 1): Patient will demonstrate efficient mastication with complete oral clearance with trials of regular textures without overt s/s of aspiration over 2 sessions prior to upgrade.  Skilled Therapeutic Interventions: Pt was seen for skilled ST targeting dysphagia and cognitive goals. Upon entrance to room, pt expressed desire to work toward diet advancement, therefore SLP provided regular texture trial with thin liquids to assess readiness for solid upgrade. No overt s/sx aspiration were observed throughout solid or liquid intake. Pt demonstrated efficient mastication and oral clearance of trace buccal residue Mod I via use of finger sweep (pt reports lateral lingual ROM reduced and prefers to use finger) and liquid wash. Per chart review, primary SLP suspected pt could upgrade solids but had preferred Dys 3 texture until now. Based on her presentation today and notes from primary SLP, recommend pt upgrade to regular solids (heart healthy), continue thin liquids, and please provide set up assistance for pt with meals. Of note, pt also  reported experiencing symptoms consistent with globus sensation with intake of meats and dense breads - educated pt regarding esophageal precautions and correlation between globus sensation and her GERD dx. SLP further facilitated session with 2 complex reasoning and scheduling worksheets. Pt required Supervision A verbal cues for attention to detail, planning, and organization, Mod I for problem solving throughout both tasks. Pt demonstrated ability to alternate attention between conversation with SLP and worksheets Mod I. She also exhibited one instance of divided attention when MD arrived - she answered some of his questions and completed part of worksheet simultaneously. Pt was left laying comfortably in bed with alarm set and all needs within reach. Continue per current plan of care.       Pain Pain Assessment Pain Scale: Faces Faces Pain Scale: No hurt  Therapy/Group: Individual Therapy  Arbutus Leas 07/28/2019, 9:40 AM

## 2019-07-29 ENCOUNTER — Inpatient Hospital Stay (HOSPITAL_COMMUNITY): Payer: No Typology Code available for payment source | Admitting: Physical Therapy

## 2019-07-29 ENCOUNTER — Inpatient Hospital Stay (HOSPITAL_COMMUNITY): Payer: No Typology Code available for payment source | Admitting: Speech Pathology

## 2019-07-29 ENCOUNTER — Inpatient Hospital Stay (HOSPITAL_COMMUNITY): Payer: No Typology Code available for payment source | Admitting: Occupational Therapy

## 2019-07-29 LAB — GLUCOSE, CAPILLARY
Glucose-Capillary: 84 mg/dL (ref 70–99)
Glucose-Capillary: 93 mg/dL (ref 70–99)
Glucose-Capillary: 80 mg/dL (ref 70–99)
Glucose-Capillary: 90 mg/dL (ref 70–99)

## 2019-07-29 MED ORDER — AMPHETAMINE-DEXTROAMPHETAMINE 10 MG PO TABS
10.0000 mg | ORAL_TABLET | Freq: Every day | ORAL | Status: DC
  Administered 2019-07-30 – 2019-08-13 (×15): 10 mg via ORAL
  Filled 2019-07-29 (×15): qty 1

## 2019-07-29 NOTE — Progress Notes (Signed)
Occupational Therapy Session Note  Patient Details  Name: Kathryn Coffey MRN: [id number] Date of Birth: 15-Jul-1971  Today's Date: 07/29/2019 OT Individual Time: 1102-1204 OT Individual Time Calculation (min): 62 min   Short Term Goals: Week 1:  OT Short Term Goal 1 (Week 1): Pt will manage LUE prior to transfers with no VC to demo improved L attention OT Short Term Goal 2 (Week 1): Pt will consistently transfer to toilet with MIN A OT Short Term Goal 3 (Week 1): Pt will don shirt wiht MIN A  Skilled Therapeutic Interventions/Progress Updates:   Pt greeted sitting in wc with spouse present and agreeable to OT treatment session. Pt requests to shower today. Stand-pivot into shower using grab bars and min/mod A. Pt sat on tub bench with cut out. Hand over hand A to integrate L UE into bathing tasks for neuro re-ed. Pt with increase scapula activation today. OT provided pt with LH sponge and worked on use of LH sponge to wash peri-area and buttocks in standing with mod A for balance in shower. Worked on hemi-dressing techniques with good recall of strategies. Pt was able to thread pant legs with min A, but needed mod A to pull pants up over hips. Max A for donning shoes and AFO. Pt brought to therapy gym stood in standing frame with min A. Weight bearing towel pushes for L UE neuro re-ed. Pt returned to room and left seated in wc with alarm belt on, husband present, and needs met.    Therapy Documentation Precautions:  Precautions Precautions: Fall Precaution Comments: left hemiparesis. L inattention Restrictions Weight Bearing Restrictions: No Pain:  denies pain   Therapy/Group: Individual Therapy  Valma Cava 07/29/2019, 12:25 PM

## 2019-07-29 NOTE — Progress Notes (Signed)
Physical Therapy Weekly Progress Note  Patient Details  Name: Kathryn Coffey MRN: [id number] Date of Birth: 1971/06/20  Beginning of progress report period: July 23, 2019 End of progress report period: July 29, 2019  Today's Date: 07/29/2019  Patient has met 1 of 4 short term goals.  Pt is making fair progress towards LTG's. Pt currently requires min assist overall for transfers & min assist for short distance gait with RW, L hand orthosis, & L AFO. [employer] account manager) has been present for 2 AFO consults & current recommendation is pt use GRAFO with medial strut but with lateral t-strap to provide stability at ankle to prevent inversion. Pt demonstrates decreased endurance as she fatigues quickly with all tasks & some decreased attention to L side of body & slight impulsivity (MD to add medication to attempt to assist with this). Pt would benefit from ongoing PT services to focus on L NMR, strengthening, balance, endurance, transfers, bed mobility, gait, safety awareness & pt education.   Patient continues to demonstrate the following deficits muscle weakness, decreased cardiorespiratoy endurance, decreased coordination, decreased attention to left, decreased attention, decreased awareness, decreased problem solving, decreased safety awareness and decreased memory, and decreased sitting balance, decreased standing balance, decreased postural control, decreased balance strategies and L hemiparesis and therefore will continue to benefit from skilled PT intervention to increase functional independence with mobility.  Patient progressing toward long term goals..  Continue plan of care.  PT Short Term Goals Week 1:  PT Short Term Goal 1 (Week 1): Pt will consistently perform bed mobility with mod assist PT Short Term Goal 1 - Progress (Week 1): Met PT Short Term Goal 2 (Week 1): Pt will ambulate >33f with mod assist and LRAD PT Short Term Goal 2 - Progress (Week 1): Not met PT Short Term  Goal 3 (Week 1): Pt will propell WC 1058fwith min assist PT Short Term Goal 3 - Progress (Week 1): Not met PT Short Term Goal 4 (Week 1): Pt will initiate stair training. PT Short Term Goal 4 - Progress (Week 1): Not met Week 2:  PT Short Term Goal 1 (Week 2): Pt will ambulate 75 ft with LRAD & min assist. PT Short Term Goal 2 (Week 2): Pt will initiate stair training. PT Short Term Goal 3 (Week 2): Pt will complete car transfer with LRAD & min assist.   Therapy Documentation Precautions:  Precautions Precautions: Fall Precaution Comments: left hemiparesis. L inattention Restrictions Weight Bearing Restrictions: No   Therapy/Group: Individual Therapy  ViWaunita Schooner2/03/2019, 3:22 PM

## 2019-07-29 NOTE — Progress Notes (Signed)
[facility] PHYSICAL MEDICINE & REHABILITATION PROGRESS NOTE   Subjective/Complaints: Slept better with cool room last night. Told me that she has a history of being "impulsive". Hence the adderall prescription. Notes that therapy has given her feedback that she's impulsive at times  ROS: Patient denies fever, rash, sore throat, blurred vision, nausea, vomiting, diarrhea, cough, shortness of breath or chest pain,   headache, or mood change.      Objective:   No results found. Recent Labs    07/28/19 0626  WBC 11.0*  HGB 9.3*  HCT 30.6*  PLT 571*   Recent Labs    07/28/19 0626  NA 139  K 3.6  CL 106  CO2 22  GLUCOSE 102*  BUN 16  CREATININE 0.64  CALCIUM 8.9    Intake/Output Summary (Last 24 hours) at 07/29/2019 0913 Last data filed at 07/28/2019 1856 Gross per 24 hour  Intake 500 ml  Output -  Net 500 ml     Physical Exam: Vital Signs Blood pressure (!) 135/91, pulse 74, temperature 97.8 F (36.6 C), resp. rate 17, height 5\' 5"  (1.651 m), weight 124 kg, SpO2 100 %. Constitutional: No distress . Vital signs reviewed. HEENT: EOMI, oral membranes moist Neck: supple Cardiovascular: RRR without murmur. No JVD    Respiratory: CTA Bilaterally without wheezes or rales. Normal effort    GI: BS +, non-tender, non-distended  Skin: intact Neuro: AOx3. Left facial droop. CN 2-12 otherwise intact.  Sensation 1+/2 LUE and LLE 5/5 strength on right side. LUE: 1/5 pec/bicep.  LLE: 3- to 3/5 HF, KE, ADF -stable Musc: improved right shoulder ROM A/P Psych: pleasant  Assessment/Plan: 1. Functional deficits secondary to acute ischemic right MCA stroke which require 3+ hours per day of interdisciplinary therapy in a comprehensive inpatient rehab setting.  Physiatrist is providing close team supervision and 24 hour management of active medical problems listed below.  Physiatrist and rehab team continue to assess barriers to discharge/monitor patient progress toward  functional and medical goals  Care Tool:  Bathing    Body parts bathed by patient: Chest, Abdomen, Right upper leg, Left upper leg, Right lower leg, Face   Body parts bathed by helper: Front perineal area, Buttocks, Left lower leg, Left arm, Right lower leg     Bathing assist Assist Level: Moderate Assistance - Patient 50 - 74%     Upper Body Dressing/Undressing Upper body dressing   What is the patient wearing?: Bra, Pull over shirt    Upper body assist Assist Level: Moderate Assistance - Patient 50 - 74%    Lower Body Dressing/Undressing Lower body dressing      What is the patient wearing?: Pants, Incontinence brief     Lower body assist Assist for lower body dressing: Maximal Assistance - Patient 25 - 49%     Toileting Toileting    Toileting assist Assist for toileting: Maximal Assistance - Patient 25 - 49%     Transfers Chair/bed transfer  Transfers assist     Chair/bed transfer assist level: Minimal Assistance - Patient > 75%     Locomotion Ambulation   Ambulation assist      Assist level: Minimal Assistance - Patient > 75% Assistive device: Walker-rolling(orthosis, air cast) Max distance: 10 ft   Walk 10 feet activity   Assist     Assist level: Minimal Assistance - Patient > 75% Assistive device: Walker-rolling, Orthosis(orthosis, air cast)   Walk 50 feet activity   Assist Walk 50 feet with 2 turns activity  did not occur: Safety/medical concerns         Walk 150 feet activity   Assist Walk 150 feet activity did not occur: Safety/medical concerns         Walk 10 feet on uneven surface  activity   Assist Walk 10 feet on uneven surfaces activity did not occur: Safety/medical concerns         Wheelchair     Assist Will patient use wheelchair at discharge?: Yes Type of Wheelchair: Manual    Wheelchair assist level: Supervision/Verbal cueing Max wheelchair distance: 21'    Wheelchair 50 feet with 2 turns  activity    Assist        Assist Level: Supervision/Verbal cueing   Wheelchair 150 feet activity     Assist  Wheelchair 150 feet activity did not occur: Safety/medical concerns       Blood pressure (!) 135/91, pulse 74, temperature 97.8 F (36.6 C), resp. rate 17, height 5\' 5"  (1.651 m), weight 124 kg, SpO2 100 %.    Medical Problem List and Plan: 1.  Impaired mobility and strength secondary to R MCA infarct             -patient may shower             --Continue CIR therapies including PT, OT, and SLP   -left WHO ordered, can wear PRAFO as tolerated  --Interdisciplinary Team Conference today  -spent extensive time yesterday reviewing rehab, neuro interventions, pathology, and prognosis with pt/husband.    2.  Antithrombotics: -DVT/anticoagulation:  Pharmaceutical: Lovenox             -antiplatelet therapy: ASA/Brillinta 3. Chronic R>L knee pain/Pain Management: will add Sportscreme prn. May need right knee sleeve for support.              Chronic right shoulder scapular dyskinesis--likely worsened by increased use due to flaccid left extremity. No real benefits with TPI's. Rhomboids most involved  -tizanidine has proven very helpful--reduced to QHS to help limit sedation  -continue kpad  -Scapular ROM with therapies  -lidoderm patch  Left calf pain d/t tone?--PRAFO as tolerated 4. Mood: LCSW to follow for evaluation and support.              -antipsychotic agents: N/A             -resumed wellbutrin per home dose  -on ritalin at home too for "impulsivity"--will d/w therapy today  -neuropsych eval pending.  5. Neuropsych: This patient is capable of making decisions on her own behalf. 6. Skin/Wound Care: Routine pressure relief measures.  7. Fluids/Electrolytes/Nutrition: Monitor I/O.   -encourage PO  8. Chronic HA: Takes goody powders/Motrin/Ultram. Continue tylenol prn.   - low dose topamax added--continue for now 9. T2DM: Hgb A1c-8.0.  Monitor diet.   Resumed  home dose metformin and Victoza    -cbg's ok off levemir    11. H/o band failure/gastric sleeve: Continue multivitamin. Appetite has been good.  12.  Sinus pressure: Felt to be due to allergies. Resumed Flonase and allegra to help manage symptoms. Resume allergy injections after discharge.   LOS: 7 days A FACE TO FACE EVALUATION WAS PERFORMED  Meredith Staggers 07/29/2019, 9:13 AM

## 2019-07-29 NOTE — Progress Notes (Signed)
Speech Language Pathology Discharge Summary  Patient Details  Name: Kathryn Coffey MRN: [id number] Date of Birth: [birth year]  Today's Date: 07/29/2019 SLP Individual Time: 0725-0820 SLP Individual Time Calculation (min): 55 min   Skilled Therapeutic Interventions:  Skilled treatment session focused on dysphagia and cognitive goals. SLP facilitated session by providing skilled observation with breakfast meal of regular textures with thin liquids. Patient consumed meal with overt cough X 1, suspect due to talking with a full oral cavity but patient was overall Mod I for use of swallowing compensatory strategies. Patient requested to sit upright in the chair for her meal and was able to direct her care in regards to transfers. However, Min A verbal cues were needed for attention to LUE with transfer. Patient transferred back to bed at end of session and left upright in bed with alarm on and all needs within reach. Continue with current plan of care.   Patient has met 5 of 5 long term goals.  Patient to discharge at overall Modified Independent;Supervision level.   Reasons goals not met: N/A   Clinical Impression/Discharge Summary: Patient has made functional gains and has met 5 of 5 LTGs this admission. Currently, patient is consuming regular textures with minimal overt s/s of aspiration and overall mod I for use of swallowing compensatory strategies. Patient is also overall supervision-Mod I to complete functional and familiar tasks safely in regards to attention, awareness and problem solving. Suspect patient is at her cognitive baseline, therefore, she will be discharged from skilled SLP intervention. Education is complete and patient verbalized understanding and agreement.   Recommendation:  None      Equipment: N/A   Reasons for discharge: Treatment goals met   Patient/Family Agrees with Progress Made and Goals Achieved: Yes    Adisa Vigeant, [place] 07/29/2019, 10:36 AM

## 2019-07-29 NOTE — Consult Note (Signed)
Neuropsychological Consultation   Patient:   Kathryn Coffey   DOB:   Jun 30, 1971  MR Number:  [id number]  Location:  [facility] [address] C[facility] [address] [place] [id number] [place] [address] Dept: [place]: [phone]           Date of Service:   07/28/2019  Start Time:   11 AM End Time:   12 PM  Provider/Observer:  Ilean Skill, Psy.D.       Clinical Neuropsychologist       Billing Code/Service: [phone]   Chief Complaint:    Kathryn Coffey is a 48 year old female with history of HTN, Diabetes, morbid obesity, sinus issues/allergies, ocular herpes one year ago.  Admitted on 07/15/2019 with reports of all night prior to admission with onset of HA and developed flaccid left hemiparesis a couple hours later.  CT showed acute non-hemorrhagic infarct in right BG and hyperdense R-MCA.  MRI showed acute infarct in right basal ganglia with hemorrhagic transformation in right caudate and right lenticular nucleus extending into insula and right temporal lobe and small acute infarct in right frontal lobe and occluded R-ICA.  Patient with resultant left sided weakness with left inattention affecting morbility and ADL.    Reason for Service:  Patient referred for neuropsychological consultation due to coping and adjustment.  Below is the HPI for the current admission.  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 CT head showed acute non-hemorrhageic infarct in right basal ganglia with hyperdense R-MCA signt. CTA/P head neck showed perfusion deficit with occluded right ICA and slow flow M!. She underwent cerebral angio with revascularization of R-MCA with T1C1 revascularization and  repair of R-ICA with flow diverter device. Post procedure CT  Head negative for bleed and repeat CTA head/neck showed reocclusion of R- ICA at origin with occlusion of R-ICA stent but now with patent R-MCA. 2 D echo showed normal EF with grade 3 diastolic dysfunction. She tolerated extubation 11/24 and follow up MRI brain 11/26 showed acute infarct in right basal ganglia with hemorrhagic transformation in right caudate and right lenticular nucleus extending into insula and right temporal lobe and small acute infarct in right frontal lobe and occluded R-ICA.  To continue ASA/Brillinta due to stent and Dr. Erlinda Hong recommends 30 day event monitor after discharge. Long term BP goal 130-150 range given stent occlusion.  Patient with resultant left sided weakness with left inattention affecting mobility and ADLs. CIR recommended due to functional decline.   Current Status:  Patient with anxiety and worry about coping with severe motor deficits in left arm.  Is improving left leg.  Anxiety and coping issues.  Behavioral Observation: Kathryn Coffey  presents as a 48 y.o.-year-old Right Caucasian Female who appeared her stated age. her dress was Appropriate and she was Well Groomed and her manners were Appropriate to the situation.  her participation was indicative of Appropriate and Redirectable behaviors.  There were any physical disabilities noted.  she displayed an appropriate level of cooperation and motivation.     Interactions:    Active Appropriate and Redirectable  Attention:   abnormal and attention span appeared shorter than expected for age  Memory:   within normal  limits; recent and remote memory intact  Visuo-spatial:  not examined  Speech (Volume):  normal  Speech:   normal; normal  Thought Process:  Coherent and Relevant  Though Content:  WNL; not suicidal and not homicidal  Orientation:   person, place, time/date and  situation  Judgment:   Fair  Planning:   Poor  Affect:    Anxious  Mood:    Anxious  Insight:   Fair  Intelligence:   normal  Medical History:   Past Medical History:  Diagnosis Date  . Abdominal wall cellulitis 2013 and 2014  . Asthma    allergic to grasses  . B12 deficiency   . Complication of anesthesia   . Costochondritis   . Diabetes mellitus without complication ([place])   . Gastric anomaly    multiple small ulcers  . GERD (gastroesophageal reflux disease)   . Herpes 06/2018   POSSIBLY IN EYE- PT TAKING VALTREX AND WILL SEE OPTHAMOLOGIST TO SEE IF VALTREX IS WORKING  . History of abnormal cervical Pap smear   . History of Clostridium difficile colitis   . History of hiatal hernia   . History of kidney stones   . Hypertension   . IBS (irritable bowel syndrome)   . Migraine   . Morbid obesity ([place])    s/p attempted gastric banding now decompressed  . PCOS (polycystic ovarian syndrome)   . PONV (postoperative nausea and vomiting)    WITH SPINAL ONLY    Psychiatric History:  No prior psychiatric history.  Family Med/Psych History:  Family History  Problem Relation Age of Onset  . Breast cancer Maternal Grandmother 49  . Diabetes Mother   . 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  Admitted on 07/15/2019 with reports of all night prior to admission with onset of HA and developed flaccid left hemiparesis a couple hours later.  CT showed acute non-hemorrhagic infarct in right BG and hyperdense R-MCA.  MRI showed acute infarct in right basal ganglia with hemorrhagic transformation in right caudate and right lenticular nucleus extending into insula and right temporal lobe and small acute infarct in right frontal lobe and occluded R-ICA.  Patient with resultant left sided weakness with left inattention affecting morbility and ADL.     Disposition/Plan:  Patient with coping issues with loss of function.  Will follow-up next week.  Diagnosis:    Acute ischemic right middle cerebral artery (MCA) stroke ([place]) - Plan: Ambulatory referral to Physical Medicine Rehab, Ambulatory referral to Cardiology         Electronically Signed   _______________________ Ilean Skill, Psy.D.

## 2019-07-29 NOTE — Progress Notes (Signed)
Physical Therapy Session Note  Patient Details  Name: Kathryn Coffey MRN: [id number] Date of Birth: [birth year]  Today's Date: 07/29/2019 PT Individual Time: [id number] and [id number] PT Individual Time Calculation (min): 41 min and 49 min  Short Term Goals: Week 1:  PT Short Term Goal 1 (Week 1): Pt will consistently perform bed mobility with mod assist PT Short Term Goal 2 (Week 1): Pt will ambulate >54ft with mod assist and LRAD PT Short Term Goal 3 (Week 1): Pt will propell WC 149ft with min assist PT Short Term Goal 4 (Week 1): Pt will initiate stair training.  Skilled Therapeutic Interventions/Progress Updates:  Treatment 1: Pt received in bed & agreeable to tx, denying c/o pain. Pt completes bed mobility with hospital bed features & supervision. Therapist provides total assist for donning B shoes & L AFO & pt completes stand pivot to w/c with min assist. Pt reports some urine leakage so pt assisted to toilet via stand pivot with min assist & max cuing. Therapist & pt manage brief & pt with continent void on toilet - urine with strong pungent odor, which pt has been ongoing for ~1 week now. Assisted pt with donning clean brief. Pt performs hand hygiene at sink from w/c level with supervision. Pt reports dizziness when sitting on toilet & when sitting in w/c without head support - will notify MD in team conference today & will request vestibular eval as pt reports she bumped her head on her nightstand at the time of the medical event at home. Transported pt to/from dayroom via w/c dependent assist for time management. Pt utilized kinetron from w/c level with cuing for increased LLE activation with task focusing on LLE strengthening & NMR. At end of session pt left sitting in w/c in room with chair alarm donned & all needs in reach.  Treatment 2: Pt received in w/c & agreeable to tx. No c/o pain reported during session but pt does note significant fatigue after 2 therapy sessions & sitting up all  day. Transported pt to hallway outside of gym via w/c dependent assist. Gerald [employer] account manager) present for AFO consult. Pt ambulated 15 ft - 40 ft with RW & min assist with pt frequently bumping L wheel with LLE with cuing for RW management & LLE foot placement. Also focused on pt managing L hand on L hand orthosis with therapist providing ongoing instructional cuing. Trialed 2 GRAFO's, a hybrid AFO, and PLS with most stability noted with one GRAFO with medial support with Chris's recommendation of applying a t-strap to provide lateral support to ankle to prevent inversion. Chris plans to bring pt's AFO with t-strap tomorrow. Pt did report urinary incontinence during session & at end of session pt returned to room via w/c dependent assist & NT made aware of pt's need to be cleaned up & assisted back to bed. Pt left in w/c in care of NT.  Therapy Documentation Precautions:  Precautions Precautions: Fall Precaution Comments: left hemiparesis. L inattention Restrictions Weight Bearing Restrictions: No    Therapy/Group: Individual Therapy  Waunita Schooner 07/29/2019, 3:14 PM

## 2019-07-29 NOTE — Patient Care Conference (Signed)
Inpatient RehabilitationTeam Conference and Plan of Care Update Date: 07/29/2019   Time: 10:40 AM   Patient Name: Kathryn Coffey      Medical Record Number: [id number]  Date of Birth: 12-Jan-1971 Sex: Female         Room/Bed: 4M08C/4M08C-01 Payor Info: Payor: [employer] / Plan:  FOCUS / Product Type: *No Product type* /    Admit Date/Time:  07/22/2019  6:05 PM  Primary Diagnosis:  Acute ischemic right middle cerebral artery (MCA) stroke [facility])  Patient Active Problem List   Diagnosis Date Noted  . Acute ischemic right middle cerebral artery (MCA) stroke ([place]) 07/22/2019  . Carotid artery dissection  (HCC) s/p stent placement 07/21/2019  . Diabetes mellitus type II, uncontrolled ([place]) 07/21/2019  . Acute blood loss anemia 07/21/2019  . Leukocytosis 07/21/2019  . Stroke (cerebrum) ([place]) - R MCA infarct s/p tenecteplase and mechanical thrombectomy w/ d/t ICA dissection  07/15/2019  . Middle cerebral artery embolism, right 07/15/2019  . Controlled type 2 diabetes mellitus without complication, without long-term current use of insulin ([place]) 05/28/2017  . Attention deficit hyperactivity disorder (ADHD), combined type 04/18/2017  . Morbid obesity ([place]) 01/25/2016  . Umbilical hernia 56/38/7564  . Hiatal hernia 12/08/2015  . History of Clostridium difficile colitis 06/19/2014  . HTN (hypertension) 12/23/2013  . PCOS (polycystic ovarian syndrome) 12/23/2013  . Bariatric surgery status 01/28/2013    Expected Discharge Date: Expected Discharge Date: 08/13/19  Team Members Present: Physician leading conference: Dr. Alger Simons Social Worker Present: Lennart Pall, LCSW Nurse Present: Rozetta Nunnery, RN Case Manager: Karene Fry, RN PT Present: Lavone Nian, PT OT Present: Cherylynn Ridges, OT SLP Present: Weston Anna, SLP PPS Coordinator present : Gunnar Fusi, SLP     Current Status/Progress Goal Weekly Team Focus  Bowel/Bladder   Continent of bowel and bladder.  LBM 12/6  remain continent of B&B  assess and monitor q shift   Swallow/Nutrition/ Hydration   Regular textures with thin liquids, Mod I  Mod I  goals Met, D/C from SLP   ADL's   Mod A transfers, mod A UB ADLs, Mod/Max A LB ADLs, Max A toileting  Supervision, may downgrade to min A  L NMR, self-care retraining, activity tolerance, pt/family ed, functional use of L UE   Mobility   min assist transfers, min/mod assist very short distance gait with RW, LLE instability with ongoing AFO consult, slightly impulsive but this may be baseline, supervision w/c mobility  supervision overall with LRAD except CGA 3 steps with 1 rail for home access  LLE NMR, strengthening, balance, attention, coordination, gait, transfers, awareness, pt education   Communication             Safety/Cognition/ Behavioral Observations  Supervision-Mod I  Supervision-Mod I  Goals Met, D/C from SLP   Pain   c/o of head and shoulder pain 8/10. Tylenol q 4 and tramadol q6 for pain relief  2/10  assess and monitor q shift   Skin   no skin issues  skin to remain intact, prevent infection  assess and monitor q shift    Rehab Goals Patient on target to meet rehab goals: Yes *See Care Plan and progress notes for long and short-term goals.     Barriers to Discharge  Current Status/Progress Possible Resolutions Date Resolved   Nursing                  PT  Inaccessible home environment;Decreased caregiver support;Home environment access/layout  unsure  if family can provide 24 hr supervision/assist at d/c.              OT                  SLP                SW                Discharge Planning/Teaching Needs:  Home with spouse and teen-aged children providing 24/7 assistance.  Teaching to be planned prior to d/c.   Team Discussion: R MCA infarct, failed stenting, talked to husband, chronic shoulder pain, anxiety, mood issues, was on adderal PTA, need to check urine.  RN - taking tramadol for pain, incontinent of  bladder, needs timed toileting q2h.  OT mod A ADLs, doing estim.  PT min A overall, AFO consult done, dizzyness, vestibular eval ordered, hit head at home with CVA, urine strong smell.  SLP constant reaching for cell phone, reg textures with thins.   Revisions to Treatment Plan: N/A     Medical Summary Current Status: right mca infarct, left hemiparesis, U>L. pain issues, right shoulder dyskinesias, anxiety/depression, Weekly Focus/Goal: treating pain, stroke education, bp control, see progress notes  Barriers to Discharge: Medical stability;Behavior       Continued Need for Acute Rehabilitation Level of Care: The patient requires daily medical management by a physician with specialized training in physical medicine and rehabilitation for the following reasons: Direction of a multidisciplinary physical rehabilitation program to maximize functional independence : Yes Medical management of patient stability for increased activity during participation in an intensive rehabilitation regime.: Yes Analysis of laboratory values and/or radiology reports with any subsequent need for medication adjustment and/or medical intervention. : Yes   I attest that I was present, lead the team conference, and concur with the assessment and plan of the team.   Retta Diones 07/29/2019, 3:24 PM  Team conference was held via web/ teleconference due to [place] - 19

## 2019-07-30 ENCOUNTER — Inpatient Hospital Stay (HOSPITAL_COMMUNITY): Payer: No Typology Code available for payment source | Admitting: Physical Therapy

## 2019-07-30 ENCOUNTER — Inpatient Hospital Stay (HOSPITAL_COMMUNITY): Payer: No Typology Code available for payment source | Admitting: Occupational Therapy

## 2019-07-30 ENCOUNTER — Inpatient Hospital Stay (HOSPITAL_COMMUNITY): Payer: No Typology Code available for payment source

## 2019-07-30 LAB — URINALYSIS, COMPLETE (UACMP) WITH MICROSCOPIC
Bacteria, UA: NONE SEEN
Bilirubin Urine: NEGATIVE
Glucose, UA: NEGATIVE mg/dL
Hgb urine dipstick: NEGATIVE
Ketones, ur: NEGATIVE mg/dL
Leukocytes,Ua: NEGATIVE
Nitrite: NEGATIVE
Protein, ur: NEGATIVE mg/dL
Specific Gravity, Urine: 1.008 (ref 1.005–1.030)
pH: 5 (ref 5.0–8.0)

## 2019-07-30 LAB — GLUCOSE, CAPILLARY
Glucose-Capillary: 96 mg/dL (ref 70–99)
Glucose-Capillary: 87 mg/dL (ref 70–99)
Glucose-Capillary: 94 mg/dL (ref 70–99)
Glucose-Capillary: 103 mg/dL — ABNORMAL HIGH (ref 70–99)

## 2019-07-30 NOTE — Progress Notes (Signed)
Occupational Therapy Session Note  Patient Details  Name: Kathryn Coffey MRN: [id number] Date of Birth: 09-18-1970  Today's Date: 07/30/2019 OT Individual Time: [id number] OT Individual Time Calculation (min): 58 min    Short Term Goals: Week 1:  OT Short Term Goal 1 (Week 1): Pt will manage LUE prior to transfers with no VC to demo improved L attention OT Short Term Goal 2 (Week 1): Pt will consistently transfer to toilet with MIN A OT Short Term Goal 3 (Week 1): Pt will don shirt wiht MIN A  Skilled Therapeutic Interventions/Progress Updates:    1:1. Pt received in w/c agreeable to tx. Pt declines showering but wants to dress/groom at sink. Pt requires ecouragement to use hemi techqniues with S for sequencing shirt and impaired selective attention with pt looking at tv screen frequently requiring min VC for rredirection. Pt requires min A to advance skort past hips in standing with L knee block for safety. Pt applies make up and grooms seated with LUE on counter for WB and HOH A to use functionally to hold/stabilize containers placed in L for L attention. Pt completes supine/sidelying NMR for improved shoulder/scapula activation with tactile cues at scap and min manual resistance at wrist for proprioceptive input. Exited session with pt seated in w/c, call light in reach and all need smet  Therapy Documentation Precautions:  Precautions Precautions: Fall Precaution Comments: left hemiparesis. L inattention Restrictions Weight Bearing Restrictions: No General:   Vital Signs: Therapy Vitals Temp: 97.8 F (36.6 C) Temp Source: Oral Pulse Rate: 69 Resp: 17 BP: 127/87 Oxygen Therapy SpO2: 96 % Pain:   ADL: ADL Grooming: Minimal assistance(A to position LUE as stabilizer) Where Assessed-Grooming: Sitting at sink Upper Body Bathing: Minimal assistance Where Assessed-Upper Body Bathing: Sitting at sink Lower Body Bathing: Moderate assistance Where Assessed-Lower Body Bathing:  Standing at sink, Sitting at sink Upper Body Dressing: Moderate assistance Where Assessed-Upper Body Dressing: Sitting at sink Lower Body Dressing: Maximal assistance Where Assessed-Lower Body Dressing: Sitting at sink, Standing at sink Toileting: Minimal assistance Where Assessed-Toileting: Glass blower/designer: Moderate assistance Toilet Transfer Method: Stand pivot Toilet Transfer Equipment: Systems analyst    Praxis   Exercises:   Other Treatments:     Therapy/Group: Individual Therapy  Tonny Branch 07/30/2019, 6:59 AM

## 2019-07-30 NOTE — Progress Notes (Signed)
Physical Therapy Session Note  Patient Details  Name: Kathryn Coffey MRN: [id number] Date of Birth: June 24, 1971  Today's Date: 07/30/2019 PT Individual Time: 1000-1055 PT Individual Time Calculation (min): 55 min   Short Term Goals: Week 2:  PT Short Term Goal 1 (Week 2): Pt will ambulate 75 ft with LRAD & min assist. PT Short Term Goal 2 (Week 2): Pt will initiate stair training. PT Short Term Goal 3 (Week 2): Pt will complete car transfer with LRAD & min assist.  Skilled Therapeutic Interventions/Progress Updates:   Pt in w/c and agreeable to therapy, no c/o pain. Total assist w/c transport to therapy gym. Therapist donned LAFO and shoes w/ total assist. Upon first stance to RW, pt reported she was incontinent of void. Returned to room and performed multiple sit<>stands at sink w/ min assist, CGA for static standing balance while therapist performed LE garment management, pericare, and donning new brief. Verbal and tactile cues for upright stance and equal LE weight distribution. Returned to gym after [employer] and worked on sit<>stands from [employer] w/ Geologist, engineering for [employer] of weight distribution on BLEs. Once in stance, all of pt's weight on RLE. Needs max verbal, tactile, and manual cues to weight shift onto L, unable to maintain for long 2/2 fear of L knee buckling. Sit<>stands w/ RLE on 3" step to bias L weight bearing. Would hold static stance for 10-20 sec at a time. Performed 10+ reps of this w/ min-mod assist fading to CGA w/ repetition. All standing performed w/o UE support. Pt's standing tolerance limited by L quad fatigue. Verbal and tactile cues for slow and controlled sit>stand. Performed same task w/ peach theraband under LLE and pulled taut. Pt cued to not let theraband snap upon standing, as this would indicate she was putting enough weight through LLE during sit>stand transition. Performed x3 reps w/ this, pt adequately bearing weight on LLE w/ cues. Returned to room total assist  via w/c. Ended session in supine, all needs in reach. Spoke w/ RN and NT about initiating timed-toileting, every 3 hrs. All in agreement.   Therapy Documentation Precautions:  Precautions Precautions: Fall Precaution Comments: left hemiparesis. L inattention Restrictions Weight Bearing Restrictions: No Pain: Pain Assessment Pain Scale: 0-10 Pain Score: 0-No pain  Therapy/Group: Individual Therapy  Niesha Bame Clent Demark 07/30/2019, 10:57 AM

## 2019-07-30 NOTE — Plan of Care (Signed)
  Problem: Consults Goal: RH STROKE PATIENT EDUCATION Description: See Patient Education module for education specifics  Outcome: Progressing   Problem: RH BOWEL ELIMINATION Goal: RH STG MANAGE BOWEL W/MEDICATION W/ASSISTANCE Description: STG Manage Bowel with Medication with [place]. Outcome: Progressing   Problem: RH SKIN INTEGRITY Goal: RH STG MAINTAIN SKIN INTEGRITY WITH ASSISTANCE Description: STG Maintain Skin Integrity With Mod I Assistance. Outcome: Progressing   Problem: RH SAFETY Goal: RH STG ADHERE TO SAFETY PRECAUTIONS W/ASSISTANCE/DEVICE Description: STG Adhere to Safety Precautions With Mod I Assistance/Device. Outcome: Progressing Goal: RH STG DECREASED RISK OF FALL WITH ASSISTANCE Description: STG Decreased Risk of Fall With Mod I Assistance. Outcome: Progressing   Problem: RH PAIN MANAGEMENT Goal: RH STG PAIN MANAGED AT OR BELOW PT'S PAIN GOAL Outcome: Progressing

## 2019-07-30 NOTE — Progress Notes (Addendum)
[place] PHYSICAL MEDICINE & REHABILITATION PROGRESS NOTE   Subjective/Complaints: Has no complaints this morning. Says her shoulder pain is much improved.  Sleeping well at night--she is on trazodone and tizanidine and she finds the latter particularly helpful. Denies constipation--had a large BM this morning.   ROS: Patient denies fever, rash, sore throat, blurred vision, nausea, vomiting, diarrhea, cough, shortness of breath or chest pain,   headache, or mood change.    Objective:   No results found. Recent Labs    07/28/19 0626  WBC 11.0*  HGB 9.3*  HCT 30.6*  PLT 571*   Recent Labs    07/28/19 0626  NA 139  K 3.6  CL 106  CO2 22  GLUCOSE 102*  BUN 16  CREATININE 0.64  CALCIUM 8.9    Intake/Output Summary (Last 24 hours) at 07/30/2019 0911 Last data filed at 07/30/2019 0700 Gross per 24 hour  Intake 360 ml  Output 200 ml  Net 160 ml     Physical Exam: Vital Signs Blood pressure 127/87, pulse 69, temperature 97.8 F (36.6 C), temperature source Oral, resp. rate 17, height 5\' 5"  (1.651 m), weight 124 kg, SpO2 96 %. Constitutional: No distress . Vital signs reviewed. Sitting in front of sink and engaged in self-care with therapist at side.  HEENT: EOMI, oral membranes moist Neck: supple Cardiovascular: RRR without murmur. No JVD    Respiratory: CTA Bilaterally without wheezes or rales. Normal effort    GI: BS +, non-tender, non-distended  Skin: intact Neuro: AOx3. Left facial droop. CN 2-12 otherwise intact.  Sensation 1+/2 LUE and LLE 5/5 strength on right side. LUE: 1/5 pec/bicep.  LLE: 3- to 3/5 HF, KE, ADF -stable Musc: improved right shoulder ROM A/P Psych: pleasant  Assessment/Plan: 1. Functional deficits secondary to acute ischemic right MCA stroke which require 3+ hours per day of interdisciplinary therapy in a comprehensive inpatient rehab setting.  Physiatrist is providing close team supervision and 24 hour management of active medical  problems listed below.  Physiatrist and rehab team continue to assess barriers to discharge/monitor patient progress toward functional and medical goals  Care Tool:  Bathing    Body parts bathed by patient: Chest, Abdomen, Right upper leg, Left upper leg, Right lower leg, Face   Body parts bathed by helper: Front perineal area, Buttocks, Left lower leg, Left arm, Right lower leg     Bathing assist Assist Level: Moderate Assistance - Patient 50 - 74%     Upper Body Dressing/Undressing Upper body dressing   What is the patient wearing?: Bra, Pull over shirt    Upper body assist Assist Level: Moderate Assistance - Patient 50 - 74%    Lower Body Dressing/Undressing Lower body dressing      What is the patient wearing?: Pants, Incontinence brief     Lower body assist Assist for lower body dressing: Maximal Assistance - Patient 25 - 49%     Toileting Toileting    Toileting assist Assist for toileting: Maximal Assistance - Patient 25 - 49%     Transfers Chair/bed transfer  Transfers assist     Chair/bed transfer assist level: Minimal Assistance - Patient > 75%     Locomotion Ambulation   Ambulation assist      Assist level: Minimal Assistance - Patient > 75% Assistive device: Walker-rolling Max distance: 40 ft   Walk 10 feet activity   Assist     Assist level: Minimal Assistance - Patient > 75% Assistive device: Walker-rolling  Walk 50 feet activity   Assist Walk 50 feet with 2 turns activity did not occur: Safety/medical concerns         Walk 150 feet activity   Assist Walk 150 feet activity did not occur: Safety/medical concerns         Walk 10 feet on uneven surface  activity   Assist Walk 10 feet on uneven surfaces activity did not occur: Safety/medical concerns         Wheelchair     Assist Will patient use wheelchair at discharge?: Yes Type of Wheelchair: Manual    Wheelchair assist level: Supervision/Verbal  cueing Max wheelchair distance: 74'    Wheelchair 50 feet with 2 turns activity    Assist        Assist Level: Supervision/Verbal cueing   Wheelchair 150 feet activity     Assist  Wheelchair 150 feet activity did not occur: Safety/medical concerns       Blood pressure 127/87, pulse 69, temperature 97.8 F (36.6 C), temperature source Oral, resp. rate 17, height 5\' 5"  (1.651 m), weight 124 kg, SpO2 96 %.    Medical Problem List and Plan: 1.  Impaired mobility and strength secondary to R MCA infarct             -patient may shower             --Continue CIR therapies including PT, OT, and SLP   -left WHO ordered, can wear PRAFO as tolerated  -spent extensive time yesterday reviewing rehab, neuro interventions, pathology, and prognosis with pt/husband.    2.  Antithrombotics: -DVT/anticoagulation:  Pharmaceutical: Lovenox             -antiplatelet therapy: ASA/Brillinta 3. Chronic R>L knee pain/Pain Management: will add Sportscreme prn. May need right knee sleeve for support.              Chronic right shoulder scapular dyskinesis--likely worsened by increased use due to flaccid left extremity. No real benefits with TPI's. Rhomboids most involved  -tizanidine has proven very helpful--reduced to QHS to help limit sedation  -continue kpad  -Scapular ROM with therapies  -lidoderm patch  Left calf pain d/t tone?--PRAFO as tolerated 4. Mood: LCSW to follow for evaluation and support.              -antipsychotic agents: N/A             -resumed wellbutrin per home dose  -on ritalin at home too for "impulsivity"  -appreciate neuropsych eval 5. Neuropsych: This patient is capable of making decisions on her own behalf. 6. Skin/Wound Care: Routine pressure relief measures.  7. Fluids/Electrolytes/Nutrition: Monitor I/O.   -encourage PO  8. Chronic HA: Takes goody powders/Motrin/Ultram. Continue tylenol prn.   - low dose topamax added--continue for now 9. T2DM: Hgb A1c-8.0.   Monitor diet.   Resumed home dose metformin and Victoza    -cbg's ok off levemir 11. H/o band failure/gastric sleeve: Continue multivitamin. Appetite has been good.  12.  Sinus pressure: Felt to be due to allergies. Resumed Flonase and allegra to help manage symptoms. Resume allergy injections after discharge.   LOS: 8 days A FACE TO FACE EVALUATION WAS PERFORMED  Ashantee Deupree P Doll Frazee 07/30/2019, 9:11 AM

## 2019-07-30 NOTE — Progress Notes (Signed)
Occupational Therapy Session Note  Patient Details  Name: Kathryn Coffey MRN: [id number] Date of Birth: [birth year]  Today's Date: 07/30/2019 OT Individual Time: [id number] OT Individual Time Calculation (min): 84 min    Short Term Goals: Week 1:  OT Short Term Goal 1 (Week 1): Pt will manage LUE prior to transfers with no VC to demo improved L attention OT Short Term Goal 2 (Week 1): Pt will consistently transfer to toilet with MIN A OT Short Term Goal 3 (Week 1): Pt will don shirt wiht MIN A  Skilled Therapeutic Interventions/Progress Updates:    Pt seen for vestibular evaluation and treatment.  Pt in bed to start session.  Had her complete inner ear canal testing in supine.  All tests negative for BPPV.  Pt reports not having dizziness or a room spinning sensation with any rolling but usually will come on with bending forward from standing position.  She transitioned from supine to sitting for further testing with mod assist and mod instructional cueing to sequence movement.  Min assist for stand pivot transfer to the wheelchair.  She did report a slight uneasy feeling with supine to sitting but no nystagmus noted and sensation was very brief, lasting less than 2 seconds.  No report of uneasiness or dizziness with transfer to the wheelchair.  Occular AROM WFLS as well as saccades.  Visual tracking [place] for all quadrants as well as gross peripheral vision testing.  She demonstrated intact VOR.  VOR cancellation test did exhibit some slight jerky tracking with reports of slight uneasiness but she was able to follow without any nystagmus.  Had pt bend forward as when reaching down to her feet and she then reported dizziness feeling after holding for a few seconds.  Noted pt tends to hold her breath as well.  Had her then sit up and complete valsalva maneuver for which she reported dizziness or uneasiness feeling for a few seconds as well.  She reports a history of right ear pain as well as a history of  dizziness when she would be working in the yard and bending down to the ground.  No nystagmus noted with any movement or test completed.  Feel pt's uneasy feeling is likely related to her right ear and her history of increased inner ear fluid and allergies.  No testing demonstrated central or peripheral cause for her dizziness outside of the positive valsalva maneuver and straining when bending forward to reach down to her feet.  She reported that her feeling was more like her head getting heavy as opposed to the room spinning sensation.  Discussed the need for her to be sure and avoid holding her breath with exertion, especially with transitional movements and when attempting to reach her feet.  Pt reports getting an allergy shot every couple of weeks as well and she has not had it since being in the hospital.  Discussed the need to talk to the MD about the allergy shot to see if it could be given in the hospital.  Therapist also followed up with the PA on this as well.   She finished session with functional mobility outside of the room and back to the bed with max assist wearing her new left AFO.  Mod facilitation for placement of the LLE and for stabilization of the LLE with weightbearing.  Pt transferred from sitting to supine with min assist and call button and phone in reach.  Bed alarm and call button in place with her spouse  also present.     Therapy Documentation Precautions:  Precautions Precautions: Fall Precaution Comments: left hemiparesis. L inattention Restrictions Weight Bearing Restrictions: No  Pain:   No report of pain during session  ADL: See Care Tool Section for some details of selfcare and mobility  Therapy/Group: Individual Therapy  Izek Corvino OTR/L 07/30/2019, 3:36 PM

## 2019-07-31 ENCOUNTER — Inpatient Hospital Stay (HOSPITAL_COMMUNITY): Payer: No Typology Code available for payment source | Admitting: Occupational Therapy

## 2019-07-31 ENCOUNTER — Inpatient Hospital Stay (HOSPITAL_COMMUNITY): Payer: No Typology Code available for payment source

## 2019-07-31 LAB — GLUCOSE, CAPILLARY
Glucose-Capillary: 87 mg/dL (ref 70–99)
Glucose-Capillary: 69 mg/dL — ABNORMAL LOW (ref 70–99)
Glucose-Capillary: 72 mg/dL (ref 70–99)
Glucose-Capillary: 106 mg/dL — ABNORMAL HIGH (ref 70–99)
Glucose-Capillary: 114 mg/dL — ABNORMAL HIGH (ref 70–99)

## 2019-07-31 NOTE — Progress Notes (Signed)
Physical Therapy Session Note  Patient Details  Name: Kathryn Coffey MRN: [id number] Date of Birth: 1970-08-26  Today's Date: 07/31/2019 PT Individual Time: 1030-1149 PT Individual Time Calculation (min): 79 min   Short Term Goals: Week 2:  PT Short Term Goal 1 (Week 2): Pt will ambulate 75 ft with LRAD & min assist. PT Short Term Goal 2 (Week 2): Pt will initiate stair training. PT Short Term Goal 3 (Week 2): Pt will complete car transfer with LRAD & min assist.     Skilled Therapeutic Interventions/Progress Updates:  Pt seated in w/c; she rated pain L shoulder 4/10.  LUE support used at all times while pt in w/c.  Husband Ray present.  Pt wearing new LAFO; she was unsure if it was donned correctly.  PT doffed it. PT educated pt and husband on how to don it; they both need more practice.  neuromuscular re-education via demo, multimodal cues for L ankle DF focusing on eversion, within pain-free range as pt has calcaneal soreness due to plantar fasciatis, per pt.  Use of Kinetron for alternating reciprocal movements at 50 cm/sec x 50 cycles x 2 targeting quadriceps, using LLE unilaterally, x 10 cycles x 3 targeting gluteal muscles. Use of BITS system, seated in w/c, for lateral wt shift to L/R as she scanned visually in all quadrants and tapped screen with R hand.   Pt reported that reaching and moving had caused a bladder accident.   PT returned pt to room.  Toilet transfer with mod assist, stand pivot using wall bar and w/c armrests.  Pt voided small amount of urine continently.  Recorded on yellow sheet in her room. Pt managed clothes with max assist to doff and don.  Pants were soaked due to pull up not being positioned well. Pt performed peri care with close supervision.    At end of session, pt seated in w/c with needs at hand, seat belt alarm set and husband present.     Therapy Documentation Precautions:  Precautions Precautions: Fall Precaution Comments: left hemiparesis. L  inattention Restrictions Weight Bearing Restrictions: No       Therapy/Group: Individual Therapy  Kristine Chahal 07/31/2019, 12:37 PM

## 2019-07-31 NOTE — Progress Notes (Signed)
Social Work Patient ID: Kathryn Coffey, female   DOB: [birth year], 48 y.o.   MRN: [id number]  Have reviewed team conference with pt and spouse.  Both aware and agreeable with targeted d/c date of 12/23 and goals of supervision to min assist overall.  Pt hopeful she might reach goals and d/c sooner.  Continue to follow.  Sanyla Summey, LCSW

## 2019-07-31 NOTE — Plan of Care (Signed)
  Problem: Consults Goal: RH STROKE PATIENT EDUCATION Description: See Patient Education module for education specifics  Outcome: Progressing   Problem: RH BOWEL ELIMINATION Goal: RH STG MANAGE BOWEL W/MEDICATION W/ASSISTANCE Description: STG Manage Bowel with Medication with [place]. Outcome: Progressing   Problem: RH SKIN INTEGRITY Goal: RH STG MAINTAIN SKIN INTEGRITY WITH ASSISTANCE Description: STG Maintain Skin Integrity With Mod I Assistance. Outcome: Progressing   Problem: RH SAFETY Goal: RH STG ADHERE TO SAFETY PRECAUTIONS W/ASSISTANCE/DEVICE Description: STG Adhere to Safety Precautions With Mod I Assistance/Device. Outcome: Progressing Goal: RH STG DECREASED RISK OF FALL WITH ASSISTANCE Description: STG Decreased Risk of Fall With Mod I Assistance. Outcome: Progressing   Problem: RH PAIN MANAGEMENT Goal: RH STG PAIN MANAGED AT OR BELOW PT'S PAIN GOAL Outcome: Progressing

## 2019-07-31 NOTE — Progress Notes (Signed)
Hypoglycemic Event  CBG: 69  Treatment: 4 oz juice/soda  Symptoms: None  Follow-up CBG: Time: 1245 CBG Result: 72  Possible Reasons for Event: Inadequate meal intake  Comments/MD notified: Reesa Chew    Darien Kading A

## 2019-07-31 NOTE — Progress Notes (Signed)
[facility] PHYSICAL MEDICINE & REHABILITATION PROGRESS NOTE   Subjective/Complaints: No new complaints this morning. Likes regular diet better. Pain controlled  ROS: Patient denies fever, rash, sore throat, blurred vision, nausea, vomiting, diarrhea, cough, shortness of breath or chest pain,   headache, or mood change.   Objective:   No results found. No results for input(s): WBC, HGB, HCT, PLT in the last 72 hours. No results for input(s): NA, K, CL, CO2, GLUCOSE, BUN, CREATININE, CALCIUM in the last 72 hours.  Intake/Output Summary (Last 24 hours) at 07/31/2019 0921 Last data filed at 07/30/2019 2020 Gross per 24 hour  Intake 480 ml  Output -  Net 480 ml     Physical Exam: Vital Signs Blood pressure (!) 136/56, pulse 66, temperature 98.1 F (36.7 C), temperature source Oral, resp. rate 19, height 5\' 5"  (1.651 m), weight 124 kg, SpO2 100 %. Constitutional: No distress . Vital signs reviewed. HEENT: EOMI, oral membranes moist Neck: supple Cardiovascular: RRR without murmur. No JVD    Respiratory: CTA Bilaterally without wheezes or rales. Normal effort    GI: BS +, non-tender, non-distended  Skin: intact Neuro: AOx3. Left facial droop. CN 2-12 otherwise intact.  Sensation 1+/2 LUE and LLE--stable 5/5 strength on right side. LUE: 1/5 pec/bicep--o/5 distal.  Arm in w/c trough LLE: 3- to 3/5 HF, KE, ADF -stable Musc: improved right shoulder ROM A/P Psych: pleasant  Assessment/Plan: 1. Functional deficits secondary to acute ischemic right MCA stroke which require 3+ hours per day of interdisciplinary therapy in a comprehensive inpatient rehab setting.  Physiatrist is providing close team supervision and 24 hour management of active medical problems listed below.  Physiatrist and rehab team continue to assess barriers to discharge/monitor patient progress toward functional and medical goals  Care Tool:  Bathing    Body parts bathed by patient: Chest, Abdomen, Right upper  leg, Left upper leg, Right lower leg, Face   Body parts bathed by helper: Front perineal area, Buttocks, Left lower leg, Left arm, Right lower leg     Bathing assist Assist Level: Moderate Assistance - Patient 50 - 74%     Upper Body Dressing/Undressing Upper body dressing   What is the patient wearing?: Bra, Pull over shirt    Upper body assist Assist Level: Moderate Assistance - Patient 50 - 74%    Lower Body Dressing/Undressing Lower body dressing      What is the patient wearing?: Pants, Incontinence brief     Lower body assist Assist for lower body dressing: Maximal Assistance - Patient 25 - 49%     Toileting Toileting    Toileting assist Assist for toileting: Maximal Assistance - Patient 25 - 49%     Transfers Chair/bed transfer  Transfers assist     Chair/bed transfer assist level: Minimal Assistance - Patient > 75%     Locomotion Ambulation   Ambulation assist      Assist level: Minimal Assistance - Patient > 75% Assistive device: Walker-rolling Max distance: 40 ft   Walk 10 feet activity   Assist     Assist level: Minimal Assistance - Patient > 75% Assistive device: Walker-rolling   Walk 50 feet activity   Assist Walk 50 feet with 2 turns activity did not occur: Safety/medical concerns         Walk 150 feet activity   Assist Walk 150 feet activity did not occur: Safety/medical concerns         Walk 10 feet on uneven surface  activity  Assist Walk 10 feet on uneven surfaces activity did not occur: Safety/medical concerns         Wheelchair     Assist Will patient use wheelchair at discharge?: Yes Type of Wheelchair: Manual    Wheelchair assist level: Supervision/Verbal cueing Max wheelchair distance: 76'    Wheelchair 50 feet with 2 turns activity    Assist        Assist Level: Supervision/Verbal cueing   Wheelchair 150 feet activity     Assist  Wheelchair 150 feet activity did not occur:  Safety/medical concerns       Blood pressure (!) 136/56, pulse 66, temperature 98.1 F (36.7 C), temperature source Oral, resp. rate 19, height 5\' 5"  (1.651 m), weight 124 kg, SpO2 100 %.    Medical Problem List and Plan: 1.  Impaired mobility and strength secondary to R MCA infarct             -patient may shower             --Continue CIR therapies including PT, OT, and SLP   -left WHO ordered, can wear PRAFO as tolerated     2.  Antithrombotics: -DVT/anticoagulation:  Pharmaceutical: Lovenox             -antiplatelet therapy: ASA/Brillinta 3. Chronic R>L knee pain/Pain Management: will add Sportscreme prn. May need right knee sleeve for support.              Chronic right shoulder scapular dyskinesis--likely worsened by increased use due to flaccid left extremity. No real benefits with TPI's. Rhomboids most involved  -tizanidine has proven very helpful--reduced to QHS to help limit sedation  -continue kpad  -Scapular ROM with therapies  -lidoderm patch  Left calf pain d/t tone?--PRAFO as tolerated 4. Mood: LCSW to follow for evaluation and support.              -antipsychotic agents: N/A             -resumed wellbutrin per home dose  -resumed her adderall per home dose which she uses for impulsivity  -appreciate neuropsych eval 5. Neuropsych: This patient is capable of making decisions on her own behalf. 6. Skin/Wound Care: Routine pressure relief measures.  7. Fluids/Electrolytes/Nutrition: Monitor I/O.   -encourage PO  8. Chronic HA: Takes goody powders/Motrin/Ultram. Continue tylenol prn.   - low dose topamax added--continue for now 9. T2DM: Hgb A1c-8.0.  Monitor diet.   Resumed home dose metformin and Victoza    -cbg's ok off levemir 11. H/o band failure/gastric sleeve: Continue multivitamin. Appetite has been good.  12.  Sinus pressure: Felt to be due to allergies. Resumed Flonase and allegra to help manage symptoms. Resume allergy injections after discharge.  13.  Bowels and bladder:  -still incontinent of urine  -timed voids  -recent ua negative  -would like to see afew more pvr's  LOS: 9 days A FACE TO FACE EVALUATION WAS PERFORMED  Meredith Staggers 07/31/2019, 9:21 AM

## 2019-07-31 NOTE — Progress Notes (Signed)
Occupational Therapy Weekly Progress Note  Patient Details  Name: Kathryn Coffey MRN: [id number] Date of Birth: 01-11-1971  Beginning of progress report period: July 23, 2019 End of progress report period: July 31, 2019  Today's Date: 07/31/2019  Session 1 OT Individual Time: 0814-4818 OT Individual Time Calculation (min): 72 min   Session 2 OT Individual Time: 5631-4970 OT Individual Time Calculation (min): 60 min    Patient has met 2 of 3 short term goals. Pt is making steady progress towards OT goals. Pt continues to have issues with incontinence and we have implemented timed toileting schedule. Pt is more consistently transferring with min A, but still needs mod A at times for toilet transfers. Pt needs Max A for toileting 2/2 body habitus and L hemiplegia- working towards modifications for this. L UE has some increased activation in scapula/shoulder, but no activation in distal L UE. Continue current POC.  Patient continues to demonstrate the following deficits: muscle weakness, decreased cardiorespiratoy endurance, impaired timing and sequencing, abnormal tone, unbalanced muscle activation, ataxia, decreased coordination and decreased motor planning, decreased attention to left, decreased safety awareness and decreased sitting balance, decreased standing balance, decreased postural control, hemiplegia and decreased balance strategies and therefore will continue to benefit from skilled OT intervention to enhance overall performance with BADL and Reduce care partner burden.  Patient progressing toward long term goals..  Continue plan of care.  OT Short Term Goals Week 1:  OT Short Term Goal 1 (Week 1): Pt will manage LUE prior to transfers with no VC to demo improved L attention OT Short Term Goal 1 - Progress (Week 1): Met OT Short Term Goal 2 (Week 1): Pt will consistently transfer to toilet with MIN A OT Short Term Goal 2 - Progress (Week 1): Progressing toward goal OT  Short Term Goal 3 (Week 1): Pt will don shirt wiht MIN A OT Short Term Goal 3 - Progress (Week 1): Met Week 2:  OT Short Term Goal 1 (Week 2): Pt will consistently transfer to toilet with min A OT Short Term Goal 2 (Week 2): Pt will complete 1/3 toileting steps OT Short Term Goal 3 (Week 2): Pt will demonstrate use of L UE as a stabilizer with min questioning cuesq OT Short Term Goal 4 (Week 2): Pt will complete LB dressing with no more than mod A  Skilled Therapeutic Interventions/Progress Updates:  Session 1   Pt greeted seated in wc and agreable to OT Treatement sessin. Pt had recently returned from the bathroom with nurse tech and had a BM her pt and nurse tech report. Updated time toileting chart and reviewed again with nurse tech. Pt declined bathing/dressing this am, but wanted to wash face and don makeup. Pt brought to the sink in wc. Educated on use of L UE as a stabilizer for opening containers, but pt continued to need cues on stabilizing positions. Pt donned makeup with min A to open some containers. Pt reported feeling wc was tight and uncomfortable. OT exchanged current wc for slightly larger one to better fit pt's body habitus and increase back support. Pt completed 3 sit<>stands at the sink for OT to exchange wc cushions. Worked on LB dressing strategies with AFO and socks. OT placed shoe buttons and educated on use of shoe funnel for donning AFO. Pt propelled wc to therapy gym using hemi technique. L UE NMR with joint input through elbow/wrsit. Pt with much improved shoulder activation today! Worked on isolated pronation/supination with pt able to  activate slight pronation, but needed OT assist to achieve supination. Pt returned to room and left seated in wc with alarm belt on and call bell in reach.   Session 2 Pt greeted semi-reclined in bed with eyes closed, pt easy to wake and agreeable to OT treatment session. Pt came to sitting EOB with max A. Stand-pivot to wc with mod A. Pt  brought into bathroom for timed toileting schedule. Pt did not have L AFO on, and when went to stand-pivot< L ankle began to roll. OT brought pt back to sitting and donned shoes and L AFO with total A for time management. Rec therapist joined and brought in drop arm BSC. Pt completed stand-pivot to [facility] with mod A. Pt able to assist with pulling down pants, but needed OT assist to help get them the rest of the way down. Pt voided bladder successfully. Pt reported she usually swings her leg out to the side to clean peri-area, but was unable to do so with the size of toilet bowl. Pt became tearful that she can't clean herself and was upset that she feels like she can't do anything for herself. Provided encouragement and emotional support to patient. Total A to don new brief and pants for time management. Pt brought down to therapy gym. Had pt crawl up onto mat facing mat with pt able to manage R side and needed max A to lift LLE onto mat +2 to bring pt into tall kneeling with UEs propped on padded stool. Worked on pushing up through L UE for weight bearing neuro re-ed. Pt then brought down into sidelying. Brought pt up to L shoulder/elbow in sidelying with max A for weight bearing. Pt completed sidelying L elbow prop 3x. Pt brought back into sitting with max A. Stand-pivot back to wc with mod A to the stronger R side. L UE propped up with arm trough and towels placed under L UE for comfort. Pt returned to room and agreeable to stay up in wc. Chair alarm on and husband present.  Therapy Documentation Precautions:  Precautions Precautions: Fall Precaution Comments: left hemiparesis. L inattention Restrictions Weight Bearing Restrictions: No Pain:  no pain reported   Therapy/Group: Individual Therapy  Valma Cava 07/31/2019, 3:26 PM

## 2019-07-31 NOTE — Evaluation (Signed)
Recreational Therapy Assessment and Plan  Patient Details  Name: Kathryn Coffey MRN: [id number] Date of Birth: 11-22-1970 Today's Date: 07/31/2019  Rehab Potential: Good ELOS: 12/23   Assessment  Problem List:      Patient Active Problem List   Diagnosis Date Noted  . Acute ischemic right middle cerebral artery (MCA) stroke ([place]) 07/22/2019  . Carotid artery dissection ([facility]) s/p stent placement 07/21/2019  . Diabetes mellitus type II, uncontrolled ([place]) 07/21/2019  . Acute blood loss anemia 07/21/2019  . Leukocytosis 07/21/2019  . Stroke (cerebrum) ([place]) - R MCA infarct s/p tenecteplase and mechanical thrombectomy w/ d/t ICA dissection  07/15/2019  . Middle cerebral artery embolism, right 07/15/2019  . Controlled type 2 diabetes mellitus without complication, without long-term current use of insulin ([place]) 05/28/2017  . Attention deficit hyperactivity disorder (ADHD), combined type 04/18/2017  . Morbid obesity ([place]) 01/25/2016  . Umbilical hernia 54/27/0623  . Hiatal hernia 12/08/2015  . History of Clostridium difficile colitis 06/19/2014  . HTN (hypertension) 12/23/2013  . PCOS (polycystic ovarian syndrome) 12/23/2013  . Bariatric surgery status 01/28/2013   Past Medical History:      Past Medical History:  Diagnosis Date  . Abdominal wall cellulitis 2013 and 2014  . Asthma    allergic to grasses  . B12 deficiency   . Complication of anesthesia   . Costochondritis   . Diabetes mellitus without complication ([place])   . Gastric anomaly    multiple small ulcers  . GERD (gastroesophageal reflux disease)   . Herpes 06/2018   POSSIBLY IN EYE- PT TAKING VALTREX AND WILL SEE OPTHAMOLOGIST TO SEE IF VALTREX IS WORKING  . History of abnormal cervical Pap smear   . History of Clostridium difficile colitis   . History of hiatal hernia   . History of kidney stones   . Hypertension   . IBS (irritable bowel syndrome)   . Migraine   . Morbid obesity ([place])    s/p attempted  gastric banding now decompressed  . PCOS (polycystic ovarian syndrome)   . PONV (postoperative nausea and vomiting)    WITH SPINAL ONLY   Past Surgical History:       Past Surgical History:  Procedure Laterality Date  . BREAST EXCISIONAL BIOPSY Left    cyst excision - Dr. Patty Sermons  . CESAREAN SECTION    . CHOLECYSTECTOMY    . COLONOSCOPY    . EAR CYST EXCISION Right 07/04/2018   Procedure: EXCISION GLOMUS TUMOR THUMBNAIL; Surgeon: Corky Mull, MD; Location: [facility] ORS; Service: Orthopedics; Laterality: Right;  . ESOPHAGOGASTRODUODENOSCOPY    . ESOPHAGOGASTRODUODENOSCOPY (EGD) WITH PROPOFOL N/A 12/06/2015   Procedure: ESOPHAGOGASTRODUODENOSCOPY (EGD) WITH PROPOFOL; Surgeon: Manya Silvas, MD; Location: [facility] ENDOSCOPY; Service: Endoscopy; Laterality: N/A;  . IR ANGIO INTRA EXTRACRAN SEL INTERNAL CAROTID UNI L MOD SED  07/15/2019  . IR ANGIO VERTEBRAL SEL SUBCLAVIAN INNOMINATE UNI R MOD SED  07/15/2019  . IR CT HEAD LTD  07/15/2019  . IR INTRAVSC STENT CERV CAROTID W/O EMB-PROT MOD SED INC ANGIO  07/15/2019  . IR PERCUTANEOUS ART THROMBECTOMY/INFUSION INTRACRANIAL INC DIAG ANGIO  07/15/2019  . LAPAROSCOPIC GASTRIC BANDING    . LAPAROSCOPIC GASTRIC RESTRICTIVE DUODENAL PROCEDURE (DUODENAL SWITCH) N/A 01/25/2016   Procedure: LAPAROSCOPIC GASTRIC RESTRICTIVE DUODENAL PROCEDURE (DUODENAL SWITCH); Surgeon: Ladora Daniel, MD; Location: [facility] ORS; Service: General; Laterality: N/A;  . OVARY SURGERY     x2  . RADIOLOGY WITH ANESTHESIA N/A 07/15/2019   Procedure: IR WITH ANESTHESIA; Surgeon: Luanne Bras, MD;  Location: [facility]; Service: Radiology; Laterality: N/A;  . TONSILLECTOMY    . TUBAL LIGATION    . UMBILICAL HERNIA REPAIR N/A 01/25/2016   Procedure: LAPAROSCOPIC UMBILICAL HERNIA; Surgeon: Ladora Daniel, MD; Location: [facility] ORS; Service: General; Laterality: N/A;  . UMBILICAL HERNIA REPAIR N/A 10/20/2016   Procedure: HERNIA REPAIR UMBILICAL ADULT; Surgeon: Leonie Green, MD;  Location: [facility] ORS; Service: General; Laterality: N/A;   Assessment & Plan  Clinical Impression: Patient is a 48 year old Rh-female with history of HTN, T2DM, morbid obesity, sinus issues/allergies, ocular herpes a year ago who was admitted on 07/15/19 with reports of all night prior to admission with onset of HA and developed flaccid Left hemiparesis a couple of hours later. CT head showed acute non-hemorrhageic infarct in right basal ganglia with hyperdense R-MCA signt. CTA/P head neck showed perfusion deficit with occluded right ICA and slow flow M!. She underwent cerebral angio with revascularization of R-MCA with T1C1 revascularization and repair of R-ICA with flow diverter device. Post procedure CT Head negative for bleed and repeat CTA head/neck showed reocclusion of R- ICA at origin with occlusion of R-ICA stent but now with patent R-MCA. 2 D echo showed normal EF with grade 3 diastolic dysfunction. She tolerated extubation 11/24 and follow up MRI brain 11/26 showed acute infarct in right basal ganglia with hemorrhagic transformation in right caudate and right lenticular nucleus extending into insula and right temporal lobe and small acute infarct in right frontal lobe and occluded R-ICA. To continue ASA/Brillinta due to stent and Dr. Erlinda Hong recommends 30 day event monitor after discharge. Long term BP goal 130-150 range given stent occlusion. Patient with resultant left sided weakness with left inattention affecting mobility and ADLs Patient transferred to CIR on 07/22/2019 .   Pt presents with decreased activity tolerance, decreased functional mobility, decreased balance, decreased coordination Limiting pt's independence with leisure/community pursuits.   Leisure History/Participation Other Leisure Interests: Television;Movies;Cooking/Baking Leisure Participation Style: With Family/Friends Awareness of Community Resources: Good-identify 3 post discharge leisure resources Psychosocial /  Spiritual Social interaction - Mood/Behavior: Cooperative Academic librarian Appropriate for Education?: Yes Strengths/Weaknesses Patient Strengths/Abilities: Willingness to participate Patient weaknesses: Physical limitations TR Patient demonstrates impairments in the following area(s): Endurance;Motor  Plan Rec Therapy Plan Is patient appropriate for Therapeutic Recreation?: Yes Rehab Potential: Good Treatment times per week: Min 1 TR session >20 minutes during LOS Estimated Length of Stay: 12/23 TR Treatment/Interventions: Adaptive equipment instruction;Community reintegration;Patient/family education;1:1 session;Functional mobility training;UE/LE Coordination activities;Balance/vestibular training;Recreation/leisure participation;Leisure education;Therapeutic activities;Wheelchair propulsion/positioning Recommendations for other services: Neuropsych  Recommendations for other services: Neuropsych  Discharge Criteria: Patient will be discharged from TR if patient refuses treatment 3 consecutive times without medical reason.  If treatment goals not met, if there is a change in medical status, if patient makes no progress towards goals or if patient is discharged from hospital.  The above assessment, treatment plan, treatment alternatives and goals were discussed and mutually agreed upon: by patient  [place] 07/31/2019, 3:48 PM

## 2019-08-01 ENCOUNTER — Inpatient Hospital Stay (HOSPITAL_COMMUNITY): Payer: No Typology Code available for payment source | Admitting: Occupational Therapy

## 2019-08-01 ENCOUNTER — Inpatient Hospital Stay (HOSPITAL_COMMUNITY): Payer: No Typology Code available for payment source | Admitting: Physical Therapy

## 2019-08-01 DIAGNOSIS — R350 Frequency of micturition: Secondary | ICD-10-CM

## 2019-08-01 LAB — GLUCOSE, CAPILLARY
Glucose-Capillary: 101 mg/dL — ABNORMAL HIGH (ref 70–99)
Glucose-Capillary: 66 mg/dL — ABNORMAL LOW (ref 70–99)
Glucose-Capillary: 79 mg/dL (ref 70–99)
Glucose-Capillary: 76 mg/dL (ref 70–99)
Glucose-Capillary: 90 mg/dL (ref 70–99)

## 2019-08-01 MED ORDER — MIRABEGRON ER 25 MG PO TB24
25.0000 mg | ORAL_TABLET | Freq: Every day | ORAL | Status: DC
  Administered 2019-08-01 – 2019-08-13 (×13): 25 mg via ORAL
  Filled 2019-08-01 (×13): qty 1

## 2019-08-01 NOTE — Progress Notes (Signed)
[place] PHYSICAL MEDICINE & REHABILITATION PROGRESS NOTE   Subjective/Complaints: Pt stil c/o congestion. Hasn't received allergy shots from home yet. Uses allegra and nascort at home and asked if she could get these specifically here. Team notes ongoing urinary frequency and incontinence  ROS: Patient denies fever, rash, sore throat, blurred vision, nausea, vomiting, diarrhea, cough, shortness of breath or chest pain, joint or back pain, headache, or mood change.   .   Objective:   No results found. No results for input(s): WBC, HGB, HCT, PLT in the last 72 hours. No results for input(s): NA, K, CL, CO2, GLUCOSE, BUN, CREATININE, CALCIUM in the last 72 hours.  Intake/Output Summary (Last 24 hours) at 08/01/2019 0938 Last data filed at 07/31/2019 1815 Gross per 24 hour  Intake 352 ml  Output --  Net 352 ml     Physical Exam: Vital Signs Blood pressure 125/77, pulse 94, temperature 98.2 F (36.8 C), resp. rate 17, height 5\' 5"  (1.651 m), weight 124 kg, SpO2 97 %. Constitutional: No distress . Vital signs reviewed. Does not sound congested HEENT: EOMI, oral membranes moist Neck: supple Cardiovascular: RRR without murmur. No JVD    Respiratory: CTA Bilaterally with occ wheeze exp. Normal effort    GI: BS +, non-tender, non-distended  Skin: intact Neuro: AOx3. Left facial droop. CN 2-12 otherwise intact.  Sensation 1+/2 LUE and LLE--stable 5/5 strength on right side. LUE: 1/5 pec/bicep--o/5 distal. No changes today LLE: 3- to 3/5 HF, KE, ADF -stable Musc: improved right shoulder ROM A/P Psych: pleasant  Assessment/Plan: 1. Functional deficits secondary to acute ischemic right MCA stroke which require 3+ hours per day of interdisciplinary therapy in a comprehensive inpatient rehab setting.  Physiatrist is providing close team supervision and 24 hour management of active medical problems listed below.  Physiatrist and rehab team continue to assess barriers to  discharge/monitor patient progress toward functional and medical goals  Care Tool:  Bathing    Body parts bathed by patient: Chest, Abdomen, Right upper leg, Left upper leg, Right lower leg, Face   Body parts bathed by helper: Front perineal area, Buttocks, Left lower leg, Left arm, Right lower leg     Bathing assist Assist Level: Moderate Assistance - Patient 50 - 74%     Upper Body Dressing/Undressing Upper body dressing   What is the patient wearing?: Bra, Pull over shirt    Upper body assist Assist Level: Moderate Assistance - Patient 50 - 74%    Lower Body Dressing/Undressing Lower body dressing      What is the patient wearing?: Pants, Incontinence brief     Lower body assist Assist for lower body dressing: Maximal Assistance - Patient 25 - 49%     Toileting Toileting    Toileting assist Assist for toileting: Maximal Assistance - Patient 25 - 49%     Transfers Chair/bed transfer  Transfers assist     Chair/bed transfer assist level: Minimal Assistance - Patient > 75%     Locomotion Ambulation   Ambulation assist      Assist level: Minimal Assistance - Patient > 75% Assistive device: Walker-rolling Max distance: 40 ft   Walk 10 feet activity   Assist     Assist level: Minimal Assistance - Patient > 75% Assistive device: Walker-rolling   Walk 50 feet activity   Assist Walk 50 feet with 2 turns activity did not occur: Safety/medical concerns         Walk 150 feet activity   Assist Walk  150 feet activity did not occur: Safety/medical concerns         Walk 10 feet on uneven surface  activity   Assist Walk 10 feet on uneven surfaces activity did not occur: Safety/medical concerns         Wheelchair     Assist Will patient use wheelchair at discharge?: Yes Type of Wheelchair: Manual    Wheelchair assist level: Supervision/Verbal cueing Max wheelchair distance: 67'    Wheelchair 50 feet with 2 turns  activity    Assist        Assist Level: Supervision/Verbal cueing   Wheelchair 150 feet activity     Assist  Wheelchair 150 feet activity did not occur: Safety/medical concerns       Blood pressure 125/77, pulse 94, temperature 98.2 F (36.8 C), resp. rate 17, height 5\' 5"  (1.651 m), weight 124 kg, SpO2 97 %.    Medical Problem List and Plan: 1.  Impaired mobility and strength secondary to R MCA infarct             -patient may shower             --Continue CIR therapies including PT, OT, and SLP   -left WHO ordered, can wear PRAFO as tolerated     2.  Antithrombotics: -DVT/anticoagulation:  Pharmaceutical: Lovenox             -antiplatelet therapy: ASA/Brillinta 3. Chronic R>L knee pain/Pain Management: will add Sportscreme prn. May need right knee sleeve for support.              Chronic right shoulder scapular dyskinesis--likely worsened by increased use due to flaccid left extremity. No real benefits with TPI's. Rhomboids most involved  -tizanidine  QHS only to help limit sedation  -continue kpad  -Scapular ROM with therapies   -lidoderm patch  Left calf pain d/t tone?--PRAFO as tolerated 4. Mood: LCSW to follow for evaluation and support.              -antipsychotic agents: N/A             -resumed wellbutrin per home dose  -resumed her adderall per home dose which she uses for impulsivity  -appreciate neuropsych eval 5. Neuropsych: This patient is capable of making decisions on her own behalf. 6. Skin/Wound Care: Routine pressure relief measures.  7. Fluids/Electrolytes/Nutrition: Monitor I/O.   -encourage PO  8. Chronic HA: Takes goody powders/Motrin/Ultram. Continue tylenol prn.   - low dose topamax added--continue for now 9. T2DM: Hgb A1c-8.0.  Monitor diet.   Resumed home dose metformin and Victoza    -cbg's controlled off levemir 11. H/o band failure/gastric sleeve: Continue multivitamin. Appetite has been good.  12.  Sinus pressure: Felt to be due  to allergies. began Flonase and allegra to help manage symptoms. (explained that these were formulary meds and should be equivalent)  -I advised her that she may have allergy shots brought from home and can have them if cleared by pharmacy.   -intermittent dizziness, vertigo d/t this?  -anxiety piece as well.  13. Bowels and bladder:  -still incontinent of urine  -timed voids  -recent ua negative, will check ucx  -PVR scans are low so far.   -begin trial of myrbetriq 25mg  daily  LOS: 10 days A FACE TO FACE EVALUATION WAS PERFORMED  Meredith Staggers 08/01/2019, 9:38 AM

## 2019-08-01 NOTE — Progress Notes (Signed)
Notified provider that patient had a hypoglycemic event of 66 at 1133 this morning, but it came up to 79. She is currently eating lunch. No new orders

## 2019-08-01 NOTE — Progress Notes (Signed)
Occupational Therapy Session Note  Patient Details  Name: Kathryn Coffey MRN: [id number] Date of Birth: March 28, 1971  Today's Date: 08/01/2019  Session 1 OT Individual Time: 2542-7062 OT Individual Time Calculation (min): 71 min   Session 2 OT Individual Time: 1300-1345 OT Individual Time Calculation (min): 45 min    Short Term Goals: Week 2:  OT Short Term Goal 1 (Week 2): Pt will consistently transfer to toilet with min A OT Short Term Goal 2 (Week 2): Pt will complete 1/3 toileting steps OT Short Term Goal 3 (Week 2): Pt will demonstrate use of L UE as a stabilizer with min questioning cuesq OT Short Term Goal 4 (Week 2): Pt will complete LB dressing with no more than mod A  Skilled Therapeutic Interventions/Progress Updates:  Session 1   Pt greeted semi-reclined in bed and agreeable to OT treatment session. Pt agreeable to shower level bathing/dressing this morning. Pt said she was incontinent of urine around 4 am where nursing got her up and changed her and the bed, but she had not been to the bathroom since and was already incontinent again. OT discussed again timed toileting schedule and that she needs to get up and go to the bathroom first thing before she does anything else. Pt completed bed mobility using hook lying technique and min A. Stand-pivot to wc with RW and min A. Stand-pivot into shower using grab bars with min A. Pt voided bladder while in the shower. OT issued wash mit and had pt use L UE during bathing tasks for neuro re-ed. Pt with difficulty translating her shoulder activation into functional activity. Pt reports feeling tired 2/2 having to hold her body up, she says she feels like her whole equilibrium is off. Pt reported history of this before from allergies and inner ear issues. Pt reports she gets an allergy shot weekly, and has not had it in two weeks. Per pt, her husband is bringing the shots in to see if pharamcy can fill them. Pt also reports feeling nauseous  from the dizziness. Pt stated " I feel like I am always car sick." Nursing notified and requested nausea meds. Dressing from wc at the sink with focus on hemi techniques. Pt with good recall of UB dressing techniques with min A. OT applied 2 incontinence briefs today as pt keeps soaking through her pants. Pt was able to don both socks today using one handed technique, then OT assist to don shoes and AFO for time management. Stand-pivot from bed to recliner with RW and min A. Pt left in care of nursing with chair alarm on and needs met.   Session 2 Pt greeted asleep in recliner asleep with spouse present and agreeable to OT treatment session. Pt completed stand-pivot to [facility] with RW and min A + verbal cues for L hand placement on hemi walker. Pt needed assistance for clothing management. Pt was unable to void. Worked on pulling pants up in standing, but still needed assist to get pants up L side. Pt propelled wc using hemi-techniques to nurses station. Applied 1:1 NMES to CH1 supraspinatus and middle deltoid to help approximate shoulder joint, and Ch 2 wrist extensors.  Ratio 1:1 Rate 35 pps Waveform- Asymmetric Ramp 1.0 Pulse 300 CH1 Intensity- 25  Duration -  15  CH 2 Intensity- 20 Duration -  15  Pt returned to room and completed stand-pivot back to bed on strong R side with min A. Pt able to doff R shoe with verbal cues,  and OT assist for doffing L shoe and AFO. OT provided pt's husband with information on [employer] as he had been asking about e-stim options for private purchase. Pt left semi-reclined in bed with needs met, bed alarm on, and call bell in reach.   Therapy Documentation Precautions:  Precautions Precautions: Fall Precaution Comments: left hemiparesis. L inattention Restrictions Weight Bearing Restrictions: No Pain:  Denies pain   Therapy/Group: Individual Therapy  Valma Cava 08/01/2019, 3:16 PM

## 2019-08-01 NOTE — Progress Notes (Signed)
Physical Therapy Session Note  Patient Details  Name: MARTASIA WILKING MRN: [id number] Date of Birth: [birth year]  Today's Date: 08/01/2019 PT Individual Time: [id number] PT Individual Time Calculation (min): 88 min   Short Term Goals: Week 2:  PT Short Term Goal 1 (Week 2): Pt will ambulate 75 ft with LRAD & min assist. PT Short Term Goal 2 (Week 2): Pt will initiate stair training. PT Short Term Goal 3 (Week 2): Pt will complete car transfer with LRAD & min assist.  Skilled Therapeutic Interventions/Progress Updates:  Pt received in recliner & agreeable to tx. Pt reports dizziness & head ache & head heaviness - MD aware. Pt reports she received allergy shots weekly prior to admission & reports they helped ease inner ear symptoms - pt's husband to bring in shots this week. Pt transfers sit<>stand with min assist with ongoing instructional cuing & focus on pt managing LUE on hand orthosis. Pt ambulates ~5 steps to w/c with min assist with L AFO already donned with improved knee & ankle control. Transported pt to gym via w/c dependent assist and pt ambulates ~12 ft with RW & min assist with cuing for LLE neutral alignment as pt frequently external rotates LE. Pt reports significant dizziness & has to return to sitting. In gym, pt performed standing taps to colored targets on floor with BUE support on RW & min assist with task focusing on LLE neutral alignment vs external rotation, weight shifting L<>R, LLE NMR & coordination. Attempted to progress to taps to 3" step but pt with great difficulty lifting LLE 2/2 hip flexor weakness. Pt reports she has a [employer] explorer SUV & her husband has a [employer] F150 with a running board as options to ride home in. In ortho gym pt practiced car transfer at SUV simulated height (31") with pt completing transfer with RW via stand pivot with min assist with max instructional cuing for technique and assist to transfer LLE in/out of car. Pt requires education re: need to  decrease impulsivity & work together with caregiver as pt reports she twisted L knee when attempting to scoot back on seat without assistance, but pt reports pain quickly goes away. Pt will benefit from practicing real car transfer prior to d/c. Pt transfers w/c<>mat table via stand pivot with min assist and sit>R sidelying with light min assist for LLE, R sidelying>sitting with supervision. With pt in R sidelying and use of powder board pt focused on L hip flexion, knee extension and hip extension in gravity eliminated position with pt able to perform significant number of repetitions to fatigue. Pt with improved ROM in sidelying as compared to standing. Returned pt to room & pt completes stand pivot to toilet with min assist with therapist assisting with clothing management. Pt with slight incontinent void in brief & requires extra time to void on toilet with pt reporting burning with urination - RN made aware. Therapist provides assistance with peri care (pt unable in standing 2/2 body habitus). At end of session pt left in w/c with chair alarm donned & husband present.  Pt requires frequent seated rest breaks throughout session to help alleviate feeling dizzy/nauseous.    Upon returning to room pt's husband present with mask below chin but does position it properly when asked.    Therapy Documentation Precautions:  Precautions Precautions: Fall Precaution Comments: left hemiparesis. L inattention Restrictions Weight Bearing Restrictions: No   Therapy/Group: Individual Therapy  Waunita Schooner 08/01/2019, 10:47 AM

## 2019-08-01 NOTE — Plan of Care (Signed)
  Problem: Consults Goal: RH STROKE PATIENT EDUCATION Description: See Patient Education module for education specifics  Outcome: Progressing   Problem: RH BOWEL ELIMINATION Goal: RH STG MANAGE BOWEL W/MEDICATION W/ASSISTANCE Description: STG Manage Bowel with Medication with [place]. Outcome: Progressing   Problem: RH SKIN INTEGRITY Goal: RH STG MAINTAIN SKIN INTEGRITY WITH ASSISTANCE Description: STG Maintain Skin Integrity With Mod I Assistance. Outcome: Progressing   Problem: RH SAFETY Goal: RH STG ADHERE TO SAFETY PRECAUTIONS W/ASSISTANCE/DEVICE Description: STG Adhere to Safety Precautions With Mod I Assistance/Device. Outcome: Progressing Goal: RH STG DECREASED RISK OF FALL WITH ASSISTANCE Description: STG Decreased Risk of Fall With Mod I Assistance. Outcome: Progressing   Problem: RH PAIN MANAGEMENT Goal: RH STG PAIN MANAGED AT OR BELOW PT'S PAIN GOAL Outcome: Progressing

## 2019-08-02 ENCOUNTER — Inpatient Hospital Stay (HOSPITAL_COMMUNITY): Payer: No Typology Code available for payment source | Admitting: Occupational Therapy

## 2019-08-02 LAB — GLUCOSE, CAPILLARY
Glucose-Capillary: 97 mg/dL (ref 70–99)
Glucose-Capillary: 83 mg/dL (ref 70–99)
Glucose-Capillary: 93 mg/dL (ref 70–99)
Glucose-Capillary: 94 mg/dL (ref 70–99)

## 2019-08-02 MED ORDER — TRAZODONE HCL 50 MG PO TABS
50.0000 mg | ORAL_TABLET | Freq: Every day | ORAL | Status: DC
  Administered 2019-08-02 – 2019-08-12 (×11): 50 mg via ORAL
  Filled 2019-08-02 (×11): qty 1

## 2019-08-02 NOTE — Progress Notes (Signed)
Occupational Therapy Session Note  Patient Details  Name: Kathryn Coffey MRN: [id number] Date of Birth: 02/22/1971  Today's Date: 08/02/2019 OT Individual Time: 1660-6004 OT Individual Time Calculation (min): 90 min   Short Term Goals: Week 2:  OT Short Term Goal 1 (Week 2): Pt will consistently transfer to toilet with min A OT Short Term Goal 2 (Week 2): Pt will complete 1/3 toileting steps OT Short Term Goal 3 (Week 2): Pt will demonstrate use of L UE as a stabilizer with min questioning cuesq OT Short Term Goal 4 (Week 2): Pt will complete LB dressing with no more than mod A  Skilled Therapeutic Interventions/Progress Updates:    Pt greeted sitting in wc with spouse present and agreeable to OT treatment session. Pt reported she was incontinent of urine at 11:00 this morning, that was the last time she went to the bathroom. Discussed setting timer on her phone to remind her to call to get to the bathroom every 2 hours if nurse techs are not coming in to check. Pt completed stand-pivot to toilet in bathroom with RW and min A. Mod A for clothing management. Pt with successful BM and continent void of bladder. Pt able to open legs enough sitting on regular commode to complete peri-care and wash buttocks with min A for sitting balance when leaning. Stand-pivot off of toilet with mod A to get to standing, then min A for pivot w/ RW. Worked on one-handed strategies to don socks with pt able to achieve figure 4 position with min A for LLE placement. Worked on donning shoes and L AFO with pt needing max A to don L AFO. Pt brought to therapy gym and complete stand-pivot with mod A to therapy mat. Worked on functional use of L UE with cup stacking activity. OT provided hadn over hadn A for grasp/release of cups, then pt able to actively push across table from shoulder. No hand/wrist/elbow activation noted. OT placed e-stim on biceps to elicit activation. Utilized mirror therapy to take pt through shoulder,  elbow, wrist, hand flex/ext.Applied 1:1 NMES to CH1 supraspinatus and middle deltoid to help approximate shoulder joint, and Ch 2 wrist extensors.  Ratio 1:1 Rate 35 pps Waveform- Asymmetric Ramp 1.0 Pulse 300 CH1 Intensity- 15  Duration -  15  CH2 Intensity- 20 Duration -  15  Pt returned to room and nursing notified to take pt to the bathroom once more prior to getting back in the bed. Pt left with husband present and needs met.  Therapy Documentation Precautions:  Precautions Precautions: Fall Precaution Comments: left hemiparesis. L inattention Restrictions Weight Bearing Restrictions: No Pain: Pain Assessment Pain Scale: 0-10 Pain Score: 0-No pain   Therapy/Group: Individual Therapy  Valma Cava 08/02/2019, 2:02 PM

## 2019-08-02 NOTE — Progress Notes (Signed)
[place] PHYSICAL MEDICINE & REHABILITATION PROGRESS NOTE   Subjective/Complaints: Asked to have trazodone scheduled at HS. Otherwise some congestion. No new problems  ROS: Patient denies fever, rash, sore throat, blurred vision, nausea, vomiting, diarrhea, cough, shortness of breath or chest pain,   headache, or mood change.   .   Objective:   No results found. No results for input(s): WBC, HGB, HCT, PLT in the last 72 hours. No results for input(s): NA, K, CL, CO2, GLUCOSE, BUN, CREATININE, CALCIUM in the last 72 hours.  Intake/Output Summary (Last 24 hours) at 08/02/2019 0952 Last data filed at 08/02/2019 0809 Gross per 24 hour  Intake 420 ml  Output --  Net 420 ml     Physical Exam: Vital Signs Blood pressure (!) 138/95, pulse 67, temperature 97.7 F (36.5 C), resp. rate 19, height 5\' 5"  (1.651 m), weight 117.9 kg, SpO2 97 %. Constitutional: No distress . Vital signs reviewed. HEENT: EOMI, oral membranes moist Neck: supple Cardiovascular: RRR without murmur. No JVD    Respiratory: CTA Bilaterally without wheezes or rales. Normal effort    GI: BS +, non-tender, non-distended  Skin: intact Neuro: AOx3. Left facial droop. CN 2-12 otherwise intact.  Sensation 1+/2 LUE and LLE--stable 5/5 strength on right side. LUE: 1/5 pec/bicep--tr to 0/5 distal. stable LLE: 3- to 3/5 HF, KE, ADF -stable Musc: improved right shoulder ROM A/P Psych: pleasant  Assessment/Plan: 1. Functional deficits secondary to acute ischemic right MCA stroke which require 3+ hours per day of interdisciplinary therapy in a comprehensive inpatient rehab setting.  Physiatrist is providing close team supervision and 24 hour management of active medical problems listed below.  Physiatrist and rehab team continue to assess barriers to discharge/monitor patient progress toward functional and medical goals  Care Tool:  Bathing    Body parts bathed by patient: Chest, Abdomen, Right upper leg, Left  upper leg, Right lower leg, Face   Body parts bathed by helper: Front perineal area, Buttocks, Left lower leg, Left arm, Right lower leg     Bathing assist Assist Level: Moderate Assistance - Patient 50 - 74%     Upper Body Dressing/Undressing Upper body dressing   What is the patient wearing?: Bra, Pull over shirt    Upper body assist Assist Level: Moderate Assistance - Patient 50 - 74%    Lower Body Dressing/Undressing Lower body dressing      What is the patient wearing?: Pants, Incontinence brief     Lower body assist Assist for lower body dressing: Maximal Assistance - Patient 25 - 49%     Toileting Toileting    Toileting assist Assist for toileting: Maximal Assistance - Patient 25 - 49%     Transfers Chair/bed transfer  Transfers assist     Chair/bed transfer assist level: Minimal Assistance - Patient > 75%     Locomotion Ambulation   Ambulation assist      Assist level: Minimal Assistance - Patient > 75% Assistive device: Walker-rolling Max distance: 12 ft   Walk 10 feet activity   Assist     Assist level: Minimal Assistance - Patient > 75% Assistive device: Walker-rolling   Walk 50 feet activity   Assist Walk 50 feet with 2 turns activity did not occur: Safety/medical concerns         Walk 150 feet activity   Assist Walk 150 feet activity did not occur: Safety/medical concerns         Walk 10 feet on uneven surface  activity  Assist Walk 10 feet on uneven surfaces activity did not occur: Safety/medical concerns         Wheelchair     Assist Will patient use wheelchair at discharge?: Yes Type of Wheelchair: Manual    Wheelchair assist level: Supervision/Verbal cueing Max wheelchair distance: 102'    Wheelchair 50 feet with 2 turns activity    Assist        Assist Level: Supervision/Verbal cueing   Wheelchair 150 feet activity     Assist  Wheelchair 150 feet activity did not occur:  Safety/medical concerns       Blood pressure (!) 138/95, pulse 67, temperature 97.7 F (36.5 C), resp. rate 19, height 5\' 5"  (1.651 m), weight 117.9 kg, SpO2 97 %.    Medical Problem List and Plan: 1.  Impaired mobility and strength secondary to R MCA infarct             -patient may shower             --Continue CIR therapies including PT, OT, and SLP   -left WHO and PRAFO at night     2.  Antithrombotics: -DVT/anticoagulation:  Pharmaceutical: Lovenox             -antiplatelet therapy: ASA/Brillinta 3. Chronic R>L knee pain/Pain Management: will add Sportscreme prn. May need right knee sleeve for support.              Chronic right shoulder scapular dyskinesis--likely worsened by increased use due to flaccid left extremity. No real benefits with TPI's. Rhomboids most involved  -tizanidine  QHS only to help limit sedation  -continue kpad  -Scapular ROM with therapies   -lidoderm patch  Left calf pain d/t tone?--PRAFO as tolerated 4. Mood: LCSW to follow for evaluation and support.              -antipsychotic agents: N/A             -resumed wellbutrin per home dose  -resumed her adderall per home dose which she uses for impulsivity  -appreciate neuropsych eval  -will schedule trazodone to help with sleep 5. Neuropsych: This patient is capable of making decisions on her own behalf. 6. Skin/Wound Care: Routine pressure relief measures.  7. Fluids/Electrolytes/Nutrition: Monitor I/O.   -encourage PO  8. Chronic HA: Takes goody powders/Motrin/Ultram. Continue tylenol prn.   - low dose topamax added--continue for now 9. T2DM: Hgb A1c-8.0.  Monitor diet.   Resumed home dose metformin and Victoza    -cbg's actually quite low  -needs to eat more consistently or we'll have to reduce dose of meds 11. H/o band failure/gastric sleeve: Continue multivitamin. Appetite has been good.  12.  Sinus pressure: Felt to be due to allergies. began Flonase and allegra to help manage symptoms.  (explained that these were formulary meds and should be equivalent)  -no allergy shots while in hospital d/t compounded nature 13. Bowels and bladder:  -frequency and incontinence a little better  -timed voids  -recent ua negative, still waiting on ucx  -PVR scans are low so far.   -continue trial of myrbetriq 25mg  daily  LOS: 11 days A FACE TO FACE EVALUATION WAS PERFORMED  Meredith Staggers 08/02/2019, 9:52 AM

## 2019-08-02 NOTE — Plan of Care (Signed)
  Problem: Consults Goal: RH STROKE PATIENT EDUCATION Description: See Patient Education module for education specifics  Outcome: Progressing   Problem: RH BOWEL ELIMINATION Goal: RH STG MANAGE BOWEL W/MEDICATION W/ASSISTANCE Description: STG Manage Bowel with Medication with [place]. Outcome: Progressing   Problem: RH SKIN INTEGRITY Goal: RH STG MAINTAIN SKIN INTEGRITY WITH ASSISTANCE Description: STG Maintain Skin Integrity With Mod I Assistance. Outcome: Progressing   Problem: RH SAFETY Goal: RH STG ADHERE TO SAFETY PRECAUTIONS W/ASSISTANCE/DEVICE Description: STG Adhere to Safety Precautions With Mod I Assistance/Device. Outcome: Progressing Goal: RH STG DECREASED RISK OF FALL WITH ASSISTANCE Description: STG Decreased Risk of Fall With Mod I Assistance. Outcome: Progressing   Problem: RH PAIN MANAGEMENT Goal: RH STG PAIN MANAGED AT OR BELOW PT'S PAIN GOAL Outcome: Progressing

## 2019-08-03 LAB — GLUCOSE, CAPILLARY
Glucose-Capillary: 94 mg/dL (ref 70–99)
Glucose-Capillary: 83 mg/dL (ref 70–99)
Glucose-Capillary: 111 mg/dL — ABNORMAL HIGH (ref 70–99)
Glucose-Capillary: 103 mg/dL — ABNORMAL HIGH (ref 70–99)

## 2019-08-03 MED ORDER — FERROUS SULFATE 325 (65 FE) MG PO TABS
325.0000 mg | ORAL_TABLET | Freq: Every day | ORAL | Status: DC
  Administered 2019-08-04 – 2019-08-13 (×10): 325 mg via ORAL
  Filled 2019-08-03 (×10): qty 1

## 2019-08-03 NOTE — Progress Notes (Signed)
[place] PHYSICAL MEDICINE & REHABILITATION PROGRESS NOTE   Subjective/Complaints: Slept a little better. Concerned that she's not taking vitamins and fe++ supp that she uses at home.   ROS: Patient denies fever, rash, sore throat, blurred vision, nausea, vomiting, diarrhea, cough, shortness of breath or chest pain, joint or back pain, headache, or mood change.  .   Objective:   No results found. No results for input(s): WBC, HGB, HCT, PLT in the last 72 hours. No results for input(s): NA, K, CL, CO2, GLUCOSE, BUN, CREATININE, CALCIUM in the last 72 hours.  Intake/Output Summary (Last 24 hours) at 08/03/2019 0907 Last data filed at 08/03/2019 0816 Gross per 24 hour  Intake 560 ml  Output --  Net 560 ml     Physical Exam: Vital Signs Blood pressure 120/78, pulse 76, temperature 98.2 F (36.8 C), temperature source Oral, resp. rate 20, height 5\' 5"  (1.651 m), weight 113.8 kg, SpO2 98 %. Constitutional: No distress . Vital signs reviewed. HEENT: EOMI, oral membranes moist Neck: supple Cardiovascular: RRR without murmur. No JVD    Respiratory: CTA Bilaterally without wheezes or rales. Normal effort    GI: BS +, non-tender, non-distended  Skin: intact Neuro: AOx3. Left facial droop. CN 2-12 otherwise intact.  Sensation 1+/2 LUE and LLE--stable 5/5 strength on right side. LUE: 1/5 pec/bicep--tr to 0/5 distal. Exam unchanged LLE: 3- to 3/5 HF, KE, ADF -exam unchange Musc: improved right shoulder ROM A/P Psych: pleasant  Assessment/Plan: 1. Functional deficits secondary to acute ischemic right MCA stroke which require 3+ hours per day of interdisciplinary therapy in a comprehensive inpatient rehab setting.  Physiatrist is providing close team supervision and 24 hour management of active medical problems listed below.  Physiatrist and rehab team continue to assess barriers to discharge/monitor patient progress toward functional and medical goals  Care Tool:  Bathing     Body parts bathed by patient: Chest, Abdomen, Right upper leg, Left upper leg, Right lower leg, Face   Body parts bathed by helper: Front perineal area, Buttocks, Left lower leg, Left arm, Right lower leg     Bathing assist Assist Level: Moderate Assistance - Patient 50 - 74%     Upper Body Dressing/Undressing Upper body dressing   What is the patient wearing?: Bra, Pull over shirt    Upper body assist Assist Level: Moderate Assistance - Patient 50 - 74%    Lower Body Dressing/Undressing Lower body dressing      What is the patient wearing?: Pants, Incontinence brief     Lower body assist Assist for lower body dressing: Maximal Assistance - Patient 25 - 49%     Toileting Toileting    Toileting assist Assist for toileting: Maximal Assistance - Patient 25 - 49%     Transfers Chair/bed transfer  Transfers assist     Chair/bed transfer assist level: Minimal Assistance - Patient > 75%     Locomotion Ambulation   Ambulation assist      Assist level: Minimal Assistance - Patient > 75% Assistive device: Walker-rolling Max distance: 12 ft   Walk 10 feet activity   Assist     Assist level: Minimal Assistance - Patient > 75% Assistive device: Walker-rolling   Walk 50 feet activity   Assist Walk 50 feet with 2 turns activity did not occur: Safety/medical concerns         Walk 150 feet activity   Assist Walk 150 feet activity did not occur: Safety/medical concerns  Walk 10 feet on uneven surface  activity   Assist Walk 10 feet on uneven surfaces activity did not occur: Safety/medical concerns         Wheelchair     Assist Will patient use wheelchair at discharge?: Yes Type of Wheelchair: Manual    Wheelchair assist level: Supervision/Verbal cueing Max wheelchair distance: 45'    Wheelchair 50 feet with 2 turns activity    Assist        Assist Level: Supervision/Verbal cueing   Wheelchair 150 feet activity      Assist  Wheelchair 150 feet activity did not occur: Safety/medical concerns       Blood pressure 120/78, pulse 76, temperature 98.2 F (36.8 C), temperature source Oral, resp. rate 20, height 5\' 5"  (1.651 m), weight 113.8 kg, SpO2 98 %.    Medical Problem List and Plan: 1.  Impaired mobility and strength secondary to R MCA infarct             -patient may shower             -Continue CIR therapies including PT, OT, and SLP   -left WHO and PRAFO at night     2.  Antithrombotics: -DVT/anticoagulation:  Pharmaceutical: Lovenox             -antiplatelet therapy: ASA/Brillinta 3. Chronic R>L knee pain/Pain Management: will add Sportscreme prn. May need right knee sleeve for support.              Chronic right shoulder scapular dyskinesis--likely worsened by increased use due to flaccid left extremity. No real benefits with TPI's. Rhomboids most involved  -tizanidine  QHS only to help limit sedation  -continue kpad  -Scapular ROM with therapies   -lidoderm patch    4. Mood: LCSW to follow for evaluation and support.              -antipsychotic agents: N/A             -resumed wellbutrin per home dose  -resumed her adderall per home dose which she uses for impulsivity  -appreciate neuropsych eval  -will schedule trazodone to help with sleep 5. Neuropsych: This patient is capable of making decisions on her own behalf. 6. Skin/Wound Care: Routine pressure relief measures.  7. Fluids/Electrolytes/Nutrition: Monitor I/O.   -encourage PO   -discussed with pt that we can use multivit, fe+ but not her proprietary mixture 8. Chronic HA: Takes goody powders/Motrin/Ultram. Continue tylenol prn.   - low dose topamax added--continue for now 9. T2DM: Hgb A1c-8.0.  Monitor diet.   Resumed home dose metformin and Victoza    -cbg's actually quite low  -needs to eat more consistently or we'll have to reduce dose of meds 11. H/o band failure/gastric sleeve: Continue multivitamin. Appetite  has been good.  12.  Sinus pressure: Felt to be due to allergies. began Flonase and allegra to help manage symptoms. (explained that these were formulary meds and should be equivalent)  -no allergy shots while in hospital d/t compounded nature 13. Bowels and bladder:  -frequency and incontinence seems a little better  -timed voids  -recent ua negative, ucx in process  -PVR scans are low .   -continue trial of myrbetriq 25mg  daily  LOS: 12 days A FACE TO FACE EVALUATION WAS PERFORMED  Kathryn Coffey 08/03/2019, 9:07 AM

## 2019-08-04 ENCOUNTER — Inpatient Hospital Stay (HOSPITAL_COMMUNITY): Payer: No Typology Code available for payment source | Admitting: Occupational Therapy

## 2019-08-04 ENCOUNTER — Inpatient Hospital Stay (HOSPITAL_COMMUNITY): Payer: No Typology Code available for payment source | Admitting: Physical Therapy

## 2019-08-04 LAB — BASIC METABOLIC PANEL
Anion gap: 11 (ref 5–15)
BUN: 13 mg/dL (ref 6–20)
CO2: 22 mmol/L (ref 22–32)
Calcium: 9.2 mg/dL (ref 8.9–10.3)
Chloride: 107 mmol/L (ref 98–111)
Creatinine, Ser: 0.72 mg/dL (ref 0.44–1.00)
GFR calc Af Amer: >60 mL/min (ref >60)
GFR calc non Af Amer: >60 mL/min (ref >60)
Glucose, Bld: 105 mg/dL — ABNORMAL HIGH (ref 70–99)
Potassium: 3.9 mmol/L (ref 3.5–5.1)
Sodium: 140 mmol/L (ref 135–145)

## 2019-08-04 LAB — CBC
HCT: 32.2 % — ABNORMAL LOW (ref 36.0–46.0)
Hemoglobin: 9.7 g/dL — ABNORMAL LOW (ref 12.0–15.0)
MCH: 23.5 pg — ABNORMAL LOW (ref 26.0–34.0)
MCHC: 30.1 g/dL (ref 30.0–36.0)
MCV: 78 fL — ABNORMAL LOW (ref 80.0–100.0)
Platelets: 508 10*3/uL — ABNORMAL HIGH (ref 150–400)
RBC: 4.13 MIL/uL (ref 3.87–5.11)
RDW: 17.4 % — ABNORMAL HIGH (ref 11.5–15.5)
WBC: 11.4 10*3/uL — ABNORMAL HIGH (ref 4.0–10.5)
nRBC: 0 % (ref 0.0–0.2)

## 2019-08-04 LAB — GLUCOSE, CAPILLARY
Glucose-Capillary: 97 mg/dL (ref 70–99)
Glucose-Capillary: 94 mg/dL (ref 70–99)
Glucose-Capillary: 105 mg/dL — ABNORMAL HIGH (ref 70–99)
Glucose-Capillary: 102 mg/dL — ABNORMAL HIGH (ref 70–99)

## 2019-08-04 LAB — URINE CULTURE: Culture: >=100000 — AB

## 2019-08-04 MED ORDER — NITROFURANTOIN MONOHYD MACRO 100 MG PO CAPS
100.0000 mg | ORAL_CAPSULE | Freq: Two times a day (BID) | ORAL | Status: AC
  Administered 2019-08-04 – 2019-08-08 (×10): 100 mg via ORAL
  Filled 2019-08-04 (×10): qty 1

## 2019-08-04 NOTE — Progress Notes (Addendum)
Physical Therapy Weekly Progress Note  Patient Details  Name: Kathryn Coffey MRN: [id number] Date of Birth: 08-19-1971  Beginning of progress report period: July 29, 2019 End of progress report period: August 04, 2019  Today's Date: 08/04/2019 PT Individual Time: 3335-4562 PT Individual Time Calculation (min): 70 min   Patient has met 1 of 3 short term goals.  Pt is making fair progress towards LTG's. Pt continues to require min assist for transfers & min assist for short distance gait with RW, L AFO & L hand orthosis. Pt continues to require encouragement & education. Pt does demonstrate some impaired attention to L & impaired memory as she requires ongoing cuing for LUE management during transfers. Pt would benefit from continued skilled PT treatment to focus on increasing independence with all functional mobility tasks as well as hands on caregiver training prior to d/c. Pt will benefit from practicing real car transfer prior to d/c as she has a large SUV to ride home in. Another barrier that pt has is multiple steps to enter home, so pt will require education & practice with task when safe & able.   Patient continues to demonstrate the following deficits muscle weakness, decreased cardiorespiratory endurance, decreased coordination, decreased attention to left, decreased attention, decreased awareness, decreased problem solving, decreased safety awareness and decreased memory, and decreased standing balance, decreased postural control, decreased balance strategies and L hemiparesis and therefore will continue to benefit from skilled PT intervention to increase functional independence with mobility.  Patient progressing toward long term goals..  Plan of care revisions: standing balance & transfer goals downgraded to CGA 2/2 slow progress, car transfer & stair goal downgraded to min assist 2/2 slow progress.  PT Short Term Goals Week 2:  PT Short Term Goal 1 (Week 2): Pt will ambulate 75  ft with LRAD & min assist. PT Short Term Goal 1 - Progress (Week 2): Not met PT Short Term Goal 2 (Week 2): Pt will initiate stair training. PT Short Term Goal 2 - Progress (Week 2): Not met PT Short Term Goal 3 (Week 2): Pt will complete car transfer with LRAD & min assist. PT Short Term Goal 3 - Progress (Week 2): Met Week 3:  PT Short Term Goal 1 (Week 3): STG = LTG due to estimated d/c date.  Skilled Therapeutic Interventions/Progress Updates:  Pt received in bed, requesting to attempt ambulating with different pair of shoes & with aircast vs AFO 2/2 added weight of AFO. Discussed benefits of AFO & upon further discussion with pt she becomes emotional re: progress and expresses that she wants to be able to ambulate around her house, reporting her husband states he cannot push her in a w/c & she needs to be able to perform tasks for herself, & pt also discouraged at minimal progress with LUE. Therapist provides emotional support & encouragement & educates her on what a stroke is, and stroke recovery and discussed progress thus far. Educated pt on need of AFO & risks of not using it for standing/gait & pt voiced understanding. Pt reports her husband has to return to work on 12/29 & pt feels she needs someone with her to assist - therapist strongly agrees. Discussed potential of other family/friends assisting her with pt reporting her mother is close by but unsure if she can provide physical assist. Discussed SNF level of care following d/c from CIR & educated pt on ability to hire caregivers (pt has teenage sons at home but state they cannot assist  her with toileting/personal care tasks) -- LCSW made aware of discussion with pt. Pt transferred supine>sitting EOB with bed flat, no rails with supervision & encouragement to attempt task. Pt is able to sit EOB with supervision and therapist provides assistance for donning bra, shirt, shoes & L AFO for time management. Pt transfers sit<>stand with CGA  throughout session with ongoing cuing for LUE management on hand orthosis and proper hand placement for safety with stand>sit transfers. Pt ambulates to w/c with RW & min assist then propels w/c to gym with R hemi technique and supervision with rest breaks PRN 2/2 RLE fatigue. Pt ambulates ~55 ft in hallway with min assist with RLE in center of BOS causing LLE to frequently kick RW with minimal weight shifting to L. Therapist provides education & demonstration for current gait pattern & positioning within RW & how to correct. Pt transfers to EOM with min assist without AD. From EOM pt performs sit>stand with RLE elevated on 3" step with focus on anterior/L weight shifting and increased forced use of LLE for sit<>stands with increased weight shifting to LLE vs RLE - pt anxious regarding task but able to perform multiple reps with min assist. Pt propels w/c part of the way back to room in same manner noted. Pt left in w/c with chair alarm donned & all needs in reach.  Therapy Documentation Precautions:  Precautions Precautions: Fall Precaution Comments: left hemiparesis. L inattention Restrictions Weight Bearing Restrictions: No  Pain: No c/o pain.  Therapy/Group: Individual Therapy  Waunita Schooner 08/04/2019, 12:28 PM

## 2019-08-04 NOTE — Progress Notes (Signed)
Physical Therapy Session Note  Patient Details  Name: Kathryn Coffey MRN: [id number] Date of Birth: [birth year]  Today's Date: 08/04/2019 PT Individual Time: 1426-1536 PT Individual Time Calculation (min): 70 min   Short Term Goals: Week 3:  PT Short Term Goal 1 (Week 3): STG = LTG due to estimated d/c date.  Skilled Therapeutic Interventions/Progress Updates:  Pt received in bed & agreeable to tx. Pt's husband present with mask down below chin & therapist educates him on visitor mask policy & he dons mask appropriately. Pt requires cuing for supine>sit with compensatory technique as pt initially attempts to pull her trunk up. Pt is able to roll R with cuing to attend to LUE and transfer R sidelying>sitting with supervision and extra time. Therapist dons B shoes & L AFO total assist for time management. Therapist educates pt & husband on need for pt to wear AFO for safety with standing/gait & PT goals & anticipated level of care at d/c. Pt transfers bed>w/c with min assist and therapist transports pt to gym where she transfers to [facility] in same manner.  While standing EOM pt engaged in reaching overhead to L with RUE to retreive clothespins with task focusing on standing balance & weight shifting L. Pt reports feeling uncomfortable & unstable with task & therapist educated her on safety with task with therapist assistance as well as purpose of activity. Standing at stairs with RUE for support and therapist blocking L knee to prevent A/P instability pt performed RLE taps to 3" step with task focusing on increased weight bearing through LLE. Pt then with incontinence of urine so assisted back to room. Pt transfers w/c<>toilet with min assist stand pivot & therapist assists with changing brief & clothes with pt requiring max cuing to thread LLE first. Pt also with continent void on toilet & pt's husband partially assists with toileting pt. Pt performs hand hygiene at sink from w/c level. Pt left in w/c with  chair alarm donned, all needs at hand & husband present to supervise.  Therapy Documentation Precautions:  Precautions Precautions: Fall Precaution Comments: left hemiparesis. L inattention Restrictions Weight Bearing Restrictions: No  Pain: Pt denies c/o pain.    Therapy/Group: Individual Therapy  Waunita Schooner 08/04/2019, 3:43 PM

## 2019-08-04 NOTE — Progress Notes (Signed)
Occupational Therapy Session Note  Patient Details  Name: Kathryn Coffey MRN: [id number] Date of Birth: November 14, 1970  Today's Date: 08/04/2019 OT Individual Time: 1000-1100 OT Individual Time Calculation (min): 60 min    Short Term Goals: Week 2:  OT Short Term Goal 1 (Week 2): Pt will consistently transfer to toilet with min A OT Short Term Goal 2 (Week 2): Pt will complete 1/3 toileting steps OT Short Term Goal 3 (Week 2): Pt will demonstrate use of L UE as a stabilizer with min questioning cuesq OT Short Term Goal 4 (Week 2): Pt will complete LB dressing with no more than mod A  Skilled Therapeutic Interventions/Progress Updates:    Patient seated in w/c, alert and ready for therapy session.  Sit pivot transfer to/from w/c and mat table with CG/min A.  Unsupported sitting for balance activities with CS.  Completed NMRE for left UE and trunk in SSP and supine positions with hand on flat paddle - she is able to initiate shoulder and scapular mobility in all planes, occ trace elbow, able to facilitate wrist extension.  SSP to/from supine on mat with min A.   Reviewed AAROM exercises with husband upon return to room.  She remained seated in w/c with husband at close of session     Therapy Documentation Precautions:  Precautions Precautions: Fall Precaution Comments: left hemiparesis. L inattention Restrictions Weight Bearing Restrictions: No General:   Vital Signs:   Pain: Pain Assessment Pain Scale: 0-10 Pain Score: 0-No pain   Other Treatments:     Therapy/Group: Individual Therapy  Carlos Levering 08/04/2019, 12:35 PM

## 2019-08-04 NOTE — Plan of Care (Signed)
  Problem: Consults Goal: RH STROKE PATIENT EDUCATION Description: See Patient Education module for education specifics  Outcome: Progressing   Problem: RH BOWEL ELIMINATION Goal: RH STG MANAGE BOWEL W/MEDICATION W/ASSISTANCE Description: STG Manage Bowel with Medication with mod I Assistance. Outcome: Progressing   Problem: RH SKIN INTEGRITY Goal: RH STG MAINTAIN SKIN INTEGRITY WITH ASSISTANCE Description: STG Maintain Skin Integrity With min Assistance. Outcome: Progressing   Problem: RH SAFETY Goal: RH STG ADHERE TO SAFETY PRECAUTIONS W/ASSISTANCE/DEVICE Description: STG Adhere to Safety Precautions With Mod I Assistance/Device. Outcome: Progressing Goal: RH STG DECREASED RISK OF FALL WITH ASSISTANCE Description: STG Decreased Risk of Fall With Mod I Assistance. Outcome: Progressing   Problem: RH PAIN MANAGEMENT Goal: RH STG PAIN MANAGED AT OR BELOW PT'S PAIN GOAL Description: Less than 3 on 0-10 scale Outcome: Progressing

## 2019-08-04 NOTE — Progress Notes (Signed)
[place] PHYSICAL MEDICINE & REHABILITATION PROGRESS NOTE   Subjective/Complaints:   Pt reports still having signs of UTI- smelling and burning when voids.  Also had a bout of diarhea- got in vaginal area- burning and itching "LIKE YEAST infection"- husband brought in monistat already for Sx's.   ROS: Patient denies fever, rash, sore throat, blurred vision, nausea, vomiting, diarrhea, cough, shortness of breath or chest pain, joint or back pain, headache, or mood change.  .   Objective:   No results found. Recent Labs    08/04/19 0603  WBC 11.4*  HGB 9.7*  HCT 32.2*  PLT 508*   Recent Labs    08/04/19 0603  NA 140  K 3.9  CL 107  CO2 22  GLUCOSE 105*  BUN 13  CREATININE 0.72  CALCIUM 9.2    Intake/Output Summary (Last 24 hours) at 08/04/2019 1643 Last data filed at 08/04/2019 1331 Gross per 24 hour  Intake 477 ml  Output -  Net 477 ml     Physical Exam: Vital Signs Blood pressure 132/86, pulse 92, temperature 98.2 F (36.8 C), resp. rate 18, height 5\' 5"  (1.651 m), weight 116.5 kg, SpO2 100 %. Constitutional: No distress . Vital signs reviewed. Laying in bed on L side facing door, NAD HEENT: EOMI, oral membranes moist Neck: supple Cardiovascular: RRR without murmur. No JVD    Respiratory: CTA Bilaterally without wheezes or rales. Normal effort    GI: BS +, non-tender, non-distended  Skin: intact Neuro: AOx3. Left facial droop. CN 2-12 otherwise intact.  Sensation 1+/2 LUE and LLE--stable 5/5 strength on right side. LUE: 1/5 pec/bicep--tr to 0/5 distal. Exam unchanged LLE: 3- to 3/5 HF, KE, ADF -exam unchange Musc: improved right shoulder ROM A/P Psych: pleasant  Assessment/Plan: 1. Functional deficits secondary to acute ischemic right MCA stroke which require 3+ hours per day of interdisciplinary therapy in a comprehensive inpatient rehab setting.  Physiatrist is providing close team supervision and 24 hour management of active medical problems  listed below.  Physiatrist and rehab team continue to assess barriers to discharge/monitor patient progress toward functional and medical goals  Care Tool:  Bathing    Body parts bathed by patient: Chest, Abdomen, Right upper leg, Left upper leg, Right lower leg, Face   Body parts bathed by helper: Front perineal area, Buttocks, Left lower leg, Left arm, Right lower leg     Bathing assist Assist Level: Moderate Assistance - Patient 50 - 74%     Upper Body Dressing/Undressing Upper body dressing   What is the patient wearing?: Bra, Pull over shirt    Upper body assist Assist Level: Moderate Assistance - Patient 50 - 74%    Lower Body Dressing/Undressing Lower body dressing      What is the patient wearing?: Pants, Incontinence brief     Lower body assist Assist for lower body dressing: Maximal Assistance - Patient 25 - 49%     Toileting Toileting    Toileting assist Assist for toileting: Maximal Assistance - Patient 25 - 49%     Transfers Chair/bed transfer  Transfers assist     Chair/bed transfer assist level: Minimal Assistance - Patient > 75%     Locomotion Ambulation   Ambulation assist      Assist level: Minimal Assistance - Patient > 75% Assistive device: Walker-rolling Max distance: 55 ft   Walk 10 feet activity   Assist     Assist level: Minimal Assistance - Patient > 75% Assistive device: Walker-rolling  Walk 50 feet activity   Assist Walk 50 feet with 2 turns activity did not occur: Safety/medical concerns  Assist level: Minimal Assistance - Patient > 75% Assistive device: Walker-rolling    Walk 150 feet activity   Assist Walk 150 feet activity did not occur: Safety/medical concerns         Walk 10 feet on uneven surface  activity   Assist Walk 10 feet on uneven surfaces activity did not occur: Safety/medical concerns         Wheelchair     Assist Will patient use wheelchair at discharge?: Yes Type of  Wheelchair: Manual    Wheelchair assist level: Supervision/Verbal cueing Max wheelchair distance: 100 ft    Wheelchair 50 feet with 2 turns activity    Assist        Assist Level: Supervision/Verbal cueing   Wheelchair 150 feet activity     Assist  Wheelchair 150 feet activity did not occur: Safety/medical concerns       Blood pressure 132/86, pulse 92, temperature 98.2 F (36.8 C), resp. rate 18, height 5\' 5"  (1.651 m), weight 116.5 kg, SpO2 100 %.    Medical Problem List and Plan: 1.  Impaired mobility and strength secondary to R MCA infarct             -patient may shower             -Continue CIR therapies including PT, OT, and SLP   -left WHO and PRAFO at night     2.  Antithrombotics: -DVT/anticoagulation:  Pharmaceutical: Lovenox             -antiplatelet therapy: ASA/Brillinta 3. Chronic R>L knee pain/Pain Management: will add Sportscreme prn. May need right knee sleeve for support.              Chronic right shoulder scapular dyskinesis--likely worsened by increased use due to flaccid left extremity. No real benefits with TPI's. Rhomboids most involved  -tizanidine  QHS only to help limit sedation  -continue kpad  -Scapular ROM with therapies   -lidoderm patch    4. Mood: LCSW to follow for evaluation and support.              -antipsychotic agents: N/A             -resumed wellbutrin per home dose  -resumed her adderall per home dose which she uses for impulsivity  -appreciate neuropsych eval  -will schedule trazodone to help with sleep 5. Neuropsych: This patient is capable of making decisions on her own behalf. 6. Skin/Wound Care: Routine pressure relief measures.  7. Fluids/Electrolytes/Nutrition: Monitor I/O.   -encourage PO   -discussed with pt that we can use multivit, fe+ but not her proprietary mixture 8. Chronic HA: Takes goody powders/Motrin/Ultram. Continue tylenol prn.   - low dose topamax added--continue for now 9. T2DM: Hgb  A1c-8.0.  Monitor diet.   Resumed home dose metformin and Victoza    -cbg's actually quite low  -needs to eat more consistently or we'll have to reduce dose of meds 11. H/o band failure/gastric sleeve: Continue multivitamin. Appetite has been good.  12.  Sinus pressure: Felt to be due to allergies. began Flonase and allegra to help manage symptoms. (explained that these were formulary meds and should be equivalent)  -no allergy shots while in hospital d/t compounded nature 13. Bowels and bladder:  -frequency and incontinence seems a little better  -timed voids  -recent ua negative, ucx in process  -PVR  scans are low .   -continue trial of myrbetriq 25mg  daily  12/14- Urine Cx (+) for Ecoli >100k- and still had Sx's and WBC slightly elevated at 11.4- started Macrobid 100 mg BID  LOS: 13 days A FACE TO FACE EVALUATION WAS PERFORMED  Delisa Finck 08/04/2019, 4:43 PM

## 2019-08-05 ENCOUNTER — Inpatient Hospital Stay (HOSPITAL_COMMUNITY): Payer: No Typology Code available for payment source | Admitting: Occupational Therapy

## 2019-08-05 ENCOUNTER — Encounter (HOSPITAL_COMMUNITY): Payer: No Typology Code available for payment source | Admitting: Psychology

## 2019-08-05 ENCOUNTER — Inpatient Hospital Stay (HOSPITAL_COMMUNITY): Payer: No Typology Code available for payment source | Admitting: Physical Therapy

## 2019-08-05 LAB — GLUCOSE, CAPILLARY
Glucose-Capillary: 111 mg/dL — ABNORMAL HIGH (ref 70–99)
Glucose-Capillary: 96 mg/dL (ref 70–99)
Glucose-Capillary: 88 mg/dL (ref 70–99)
Glucose-Capillary: 107 mg/dL — ABNORMAL HIGH (ref 70–99)

## 2019-08-05 NOTE — Patient Care Conference (Signed)
Inpatient RehabilitationTeam Conference and Plan of Care Update Date: 08/05/2019   Time:10:50 AM   Patient Name: Kathryn Coffey      Medical Record Number: [id number]  Date of Birth: 1971-03-18 Sex: Female         Room/Bed: 4M08C/4M08C-01 Payor Info: Payor: [employer] / Plan: [place] [employer] / Product Type: *No Product type* /    Admit Date/Time:  07/22/2019  6:05 PM  Primary Diagnosis:  Acute ischemic right middle cerebral artery (MCA) stroke [facility])  Patient Active Problem List   Diagnosis Date Noted  . Acute ischemic right middle cerebral artery (MCA) stroke ([place]) 07/22/2019  . Carotid artery dissection  (HCC) s/p stent placement 07/21/2019  . Diabetes mellitus type II, uncontrolled ([place]) 07/21/2019  . Acute blood loss anemia 07/21/2019  . Leukocytosis 07/21/2019  . Stroke (cerebrum) ([place]) - R MCA infarct s/p tenecteplase and mechanical thrombectomy w/ d/t ICA dissection  07/15/2019  . Middle cerebral artery embolism, right 07/15/2019  . Controlled type 2 diabetes mellitus without complication, without long-term current use of insulin ([place]) 05/28/2017  . Attention deficit hyperactivity disorder (ADHD), combined type 04/18/2017  . Morbid obesity ([place]) 01/25/2016  . Umbilical hernia [id number]  . Hiatal hernia 12/08/2015  . History of Clostridium difficile colitis 06/19/2014  . HTN (hypertension) 12/23/2013  . PCOS (polycystic ovarian syndrome) 12/23/2013  . Bariatric surgery status 01/28/2013    Expected Discharge Date: Expected Discharge Date: 08/13/19  Team Members Present: Physician leading conference: Dr. Alger Simons Social Worker Present: Lennart Pall, LCSW Nurse Present: Rayetta Pigg, RN Case Manager: Karene Fry, RN PT Present: Lavone Nian, PT OT Present: Cherylynn Ridges, OT SLP Present: Weston Anna, SLP PPS Coordinator present : Gunnar Fusi, Novella Olive, PT     Current Status/Progress Goal Weekly Team Focus  Bowel/Bladder   can be  incontinent at times, LBM 12/14  improve continence  assess toileting q shift and prn   Swallow/Nutrition/ Hydration             ADL's   Min A transfers, min A UB ADLs, Mod A LB ADLs, Mod A toileting  CGA overall  L NMR, self-care retraining, functional use of L UE, pt/family edu   Mobility   min assist transfers, min assist short distance gait with RW, supervision w/c mobility  CGA transfers, min assist car & stairs, CGA gait with LRAD, supervision w/c mobility  LLE NMR, strengthening, balance, awareness, transfers, gait, stairs as able, pt education & d/c planning, w/c mobility   Communication             Safety/Cognition/ Behavioral Observations            Pain   no c/o pain during shift, has prns  pain less than 4  assess pain q shift and prn   Skin   bruising on abdomen  prevent skin breakdown  assess skin q shift and prn    Rehab Goals Patient on target to meet rehab goals: Yes *See Care Plan and progress notes for long and short-term goals.     Barriers to Discharge  Current Status/Progress Possible Resolutions Date Resolved   Nursing                  PT  Inaccessible home environment;Decreased caregiver support;Home environment access/layout  unsure if family can provide 24 hr supervision/assist at d/c              OT  SLP                SW                Discharge Planning/Teaching Needs:  Home with spouse and teen-aged children providing 24/7 assistance.  Teaching to be planned prior to d/c.   Team Discussion: R MCA infarct, pain issues, chronic dizzy/allergy issues, L UE weak, working on B/B, UTI.  RN BS okay, husband can help, UTI, incontinent, not calling nursing for toileting needs.  OT min A transfers, min UB/mod LB ADLs, downgrade to CGA goals.  PT min A transfers and ambulation, min A stairs, goals CGA/min A, needs encouragement.  SLP signed off last week.   Revisions to Treatment Plan: N/A     Medical Summary Current Status: right  mca infarct. improving cbg's. urinary frequency and incontinence, UTI. on macrobid now as well as myrbetriq, pain better Weekly Focus/Goal: nutrition, bladder, uti, pain, dm  Barriers to Discharge: Medical stability   Possible Resolutions to Barriers: see medical progress notes   Continued Need for Acute Rehabilitation Level of Care: The patient requires daily medical management by a physician with specialized training in physical medicine and rehabilitation for the following reasons: Direction of a multidisciplinary physical rehabilitation program to maximize functional independence : Yes Medical management of patient stability for increased activity during participation in an intensive rehabilitation regime.: Yes Analysis of laboratory values and/or radiology reports with any subsequent need for medication adjustment and/or medical intervention. : Yes   I attest that I was present, lead the team conference, and concur with the assessment and plan of the team.   Retta Diones 08/05/2019, 9:18 PM  Team conference was held via web/ teleconference due to [place] - 19

## 2019-08-05 NOTE — Progress Notes (Signed)
Physical Therapy Session Note  Patient Details  Name: Kathryn Coffey MRN: [id number] Date of Birth: 1971-04-30  Today's Date: 08/05/2019 PT Individual Time: 0805-0915 PT Individual Time Calculation (min): 70 min   Short Term Goals: Week 3:  PT Short Term Goal 1 (Week 3): STG = LTG due to estimated d/c date.  Skilled Therapeutic Interventions/Progress Updates:  Pt received in bed & agreeable to tx. Pt is able to thread shorts on BLE in bed without assistance and pull over R hips. Pt rolls R to to allow therapist to pull shorts over L hips then pt transfers supine>sit with supervision & bed rails, HOB slightly elevated. Therapist dons B shoes & L AFO total assist for time management.  Pt reports incontinent void and transfers sit<>stand with CGA, and ambulates into bathroom with RW & min assist with cuing for increased weight bearing through LLE & step through pattern with improving demo. Pt completes toilet transfer with min assist and pt with continent void on toilet, performing peri hygiene with CGA for sitting balance. Pt returns to w/c via ambulation in same manner noted above. Pt performs hand hygiene sitting at sink with supervision. Transported pt to gym via w/c dependent assist. Demonstrated & provided instruction for stair negotiation laterally with L ascending rail. Pt then negotiates 4 steps (6") x 2 trials with seated rest break in between with min assist overall with focus on LLE placement and sequencing. Pt then performed L lateral side stepping at rail in hallway with min assist with focus on LLE placement for NMR & coordination & weight bearing through LLE, as well as L hip flexor & abductor strengthening. At end of session pt left in w/c with chair alarm donned & all needs at hand.  Therapy Documentation Precautions:  Precautions Precautions: Fall Precaution Comments: left hemiparesis. L inattention Restrictions Weight Bearing Restrictions: No  Pain: No c/o pain.     Therapy/Group: Individual Therapy  Waunita Schooner 08/05/2019, 9:17 AM

## 2019-08-05 NOTE — Progress Notes (Signed)
[facility] PHYSICAL MEDICINE & REHABILITATION PROGRESS NOTE   Subjective/Complaints:  Still with urinary frequency, dysuria, a little better. Loose stools  ROS: Patient denies fever, rash, sore throat, blurred vision, nausea, vomiting,   cough, shortness of breath or chest pain, joint or back pain, headache, or mood change. .   Objective:   No results found. Recent Labs    08/04/19 0603  WBC 11.4*  HGB 9.7*  HCT 32.2*  PLT 508*   Recent Labs    08/04/19 0603  NA 140  K 3.9  CL 107  CO2 22  GLUCOSE 105*  BUN 13  CREATININE 0.72  CALCIUM 9.2    Intake/Output Summary (Last 24 hours) at 08/05/2019 1206 Last data filed at 08/05/2019 0900 Gross per 24 hour  Intake 694 ml  Output --  Net 694 ml     Physical Exam: Vital Signs Blood pressure 118/81, pulse 68, temperature 98.5 F (36.9 C), resp. rate 18, height 5\' 5"  (1.651 m), weight 116.5 kg, SpO2 98 %. Constitutional: No distress . Vital signs reviewed. HEENT: EOMI, oral membranes moist Neck: supple Cardiovascular: RRR without murmur. No JVD    Respiratory: CTA Bilaterally without wheezes or rales. Normal effort    GI: BS +, non-tender, non-distended  Skin: intact Neuro: AOx3. Left facial droop. CN 2-12 otherwise intact.  Sensation 1+/2 LUE and LLE--stable 5/5 strength on right side. LUE: 1/5 pec/bicep--tr to 0/5 distal.stable exam LLE: 3- to 3/5 HF, KE, ADF -no changes Musc: improved right shoulder ROM A/P Psych: pleasant  Assessment/Plan: 1. Functional deficits secondary to acute ischemic right MCA stroke which require 3+ hours per day of interdisciplinary therapy in a comprehensive inpatient rehab setting.  Physiatrist is providing close team supervision and 24 hour management of active medical problems listed below.  Physiatrist and rehab team continue to assess barriers to discharge/monitor patient progress toward functional and medical goals  Care Tool:  Bathing    Body parts bathed by patient:  Chest, Abdomen, Right upper leg, Left upper leg, Right lower leg, Face   Body parts bathed by helper: Front perineal area, Buttocks, Left lower leg, Left arm, Right lower leg     Bathing assist Assist Level: Moderate Assistance - Patient 50 - 74%     Upper Body Dressing/Undressing Upper body dressing   What is the patient wearing?: Bra, Pull over shirt    Upper body assist Assist Level: Moderate Assistance - Patient 50 - 74%    Lower Body Dressing/Undressing Lower body dressing      What is the patient wearing?: Pants, Incontinence brief     Lower body assist Assist for lower body dressing: Maximal Assistance - Patient 25 - 49%     Toileting Toileting    Toileting assist Assist for toileting: Maximal Assistance - Patient 25 - 49%     Transfers Chair/bed transfer  Transfers assist     Chair/bed transfer assist level: Minimal Assistance - Patient > 75%     Locomotion Ambulation   Ambulation assist      Assist level: Minimal Assistance - Patient > 75% Assistive device: Walker-rolling Max distance: 20 ft   Walk 10 feet activity   Assist     Assist level: Minimal Assistance - Patient > 75% Assistive device: Walker-rolling   Walk 50 feet activity   Assist Walk 50 feet with 2 turns activity did not occur: Safety/medical concerns  Assist level: Minimal Assistance - Patient > 75% Assistive device: Walker-rolling    Walk 150 feet  activity   Assist Walk 150 feet activity did not occur: Safety/medical concerns         Walk 10 feet on uneven surface  activity   Assist Walk 10 feet on uneven surfaces activity did not occur: Safety/medical concerns         Wheelchair     Assist Will patient use wheelchair at discharge?: Yes Type of Wheelchair: Manual    Wheelchair assist level: Supervision/Verbal cueing Max wheelchair distance: 100 ft    Wheelchair 50 feet with 2 turns activity    Assist        Assist Level:  Supervision/Verbal cueing   Wheelchair 150 feet activity     Assist  Wheelchair 150 feet activity did not occur: Safety/medical concerns       Blood pressure 118/81, pulse 68, temperature 98.5 F (36.9 C), resp. rate 18, height 5\' 5"  (1.651 m), weight 116.5 kg, SpO2 98 %.    Medical Problem List and Plan: 1.  Impaired mobility and strength secondary to R MCA infarct             -patient may shower             -Continue CIR therapies including PT, OT, and SLP   -left WHO and PRAFO at night   --Interdisciplinary Team Conference today   2.  Antithrombotics: -DVT/anticoagulation:  Pharmaceutical: Lovenox             -antiplatelet therapy: ASA/Brillinta 3. Chronic R>L knee pain/Pain Management: will add Sportscreme prn. May need right knee sleeve for support.              Chronic right shoulder scapular dyskinesis--likely worsened by increased use due to flaccid left extremity. No real benefits with TPI's. Rhomboids most involved  -tizanidine  QHS only to help limit sedation  -continue kpad  -Scapular ROM with therapies   -lidoderm patch    4. Mood: LCSW to follow for evaluation and support.              -antipsychotic agents: N/A             -resumed wellbutrin per home dose  -resumed her adderall per home dose which she uses for impulsivity  -appreciate neuropsych eval  -will schedule trazodone to help with sleep 5. Neuropsych: This patient is capable of making decisions on her own behalf. 6. Skin/Wound Care: Routine pressure relief measures.  7. Fluids/Electrolytes/Nutrition: Monitor I/O.   -encourage PO   -discussed with pt that we can use multivit, fe+ but not her proprietary mixture 8. Chronic HA: Takes goody powders/Motrin/Ultram. Continue tylenol prn.   - low dose topamax added--continue for now 9. T2DM: Hgb A1c-8.0.  Monitor diet.   Resumed home dose metformin and Victoza    -cbg's low normal 11. H/o band failure/gastric sleeve: Continue multivitamin. Appetite has  been good.  12.  Sinus pressure: Felt to be due to allergies. began Flonase and allegra to help manage symptoms. (explained that these were formulary meds and should be equivalent)  -no allergy shots while in hospital d/t compounded nature 13. Bowels and bladder:  -worsening incontinence noted by staff  -timed voids  -PVR scans are low .   -continue trial of myrbetriq 25mg  daily  E Coli: macrobid started 12/14-- continue for 7 days  LOS: 14 days A FACE TO FACE EVALUATION WAS PERFORMED  Meredith Staggers 08/05/2019, 12:06 PM

## 2019-08-05 NOTE — Consult Note (Signed)
Neuropsychological Consultation   Patient:   Kathryn Coffey   DOB:   14-Apr-1971  MR Number:  [id number]  Location:  [facility] [address] C[facility] [address] [place] [id number] [place] [address] Dept: [place]: [phone]           Date of Service:   08/05/2019  Start Time:   2 PM End Time:   3 PM  Provider/Observer:  Ilean Skill, Psy.D.       Clinical Neuropsychologist       Billing Code/Service: [id number]   Chief Complaint:    Kathryn Coffey is a 48 year old female with history of HTN, Diabetes, morbid obesity, sinus issues/allergies, ocular herpes one year ago.  Admitted on 07/15/2019 with reports of all night prior to admission with onset of HA and developed flaccid left hemiparesis a couple hours later.  CT showed acute non-hemorrhagic infarct in right BG and hyperdense R-MCA.  MRI showed acute infarct in right basal ganglia with hemorrhagic transformation in right caudate and right lenticular nucleus extending into insula and right temporal lobe and small acute infarct in right frontal lobe and occluded R-ICA.  Patient with resultant left sided weakness with left inattention affecting morbility and ADL.    Reason for Service:  Patient referred for neuropsychological consultation due to coping and adjustment.  Below is the HPI for the current admission.  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 CT head showed acute non-hemorrhageic infarct in right basal ganglia with hyperdense R-MCA signt. CTA/P head neck showed perfusion deficit with occluded right ICA and slow flow M!. She underwent cerebral angio with revascularization of R-MCA with T1C1 revascularization and  repair of R-ICA with flow diverter device. Post procedure CT  Head negative for bleed and repeat CTA head/neck showed reocclusion of R- ICA at origin with occlusion of R-ICA stent but now with patent R-MCA. 2 D echo showed normal EF with grade 3 diastolic dysfunction. She tolerated extubation 11/24 and follow up MRI brain 11/26 showed acute infarct in right basal ganglia with hemorrhagic transformation in right caudate and right lenticular nucleus extending into insula and right temporal lobe and small acute infarct in right frontal lobe and occluded R-ICA.  To continue ASA/Brillinta due to stent and Dr. Erlinda Hong recommends 30 day event monitor after discharge. Long term BP goal 130-150 range given stent occlusion.  Patient with resultant left sided weakness with left inattention affecting mobility and ADLs. CIR recommended due to functional decline.   Current Status:  Patient reports that anxiety has improved significantly especially with the planned discharge on the 23rd.  The patient is seeing improvement in her left leg but still has spasticity in her foot.  She has been standing in therapies.  The patient reports that her left hand is still more problematic than her foot.  The patient, however, reports that as she is seeing improvements that her mood is improved and she is worrying less.  However, anxiety does continue to be a problematic situation but much improved..  Behavioral Observation: Kathryn Coffey  presents as a 48 y.o.-year-old Right Caucasian Female who appeared her stated age. her dress was Appropriate and she was Well Groomed and her manners  were Appropriate to the situation.  her participation was indicative of Appropriate and Redirectable behaviors.  There were any physical disabilities noted.  she displayed an appropriate level of cooperation and motivation.     Interactions:    Active Appropriate and Redirectable  Attention:   abnormal and attention span appeared shorter than expected for  age  Memory:   within normal limits; recent and remote memory intact  Visuo-spatial:  not examined  Speech (Volume):  normal  Speech:   normal; normal  Thought Process:  Coherent and Relevant  Though Content:  WNL; not suicidal and not homicidal  Orientation:   person, place, time/date and situation  Judgment:   Fair  Planning:   Poor  Affect:    Anxious  Mood:    Anxious  Insight:   Fair  Intelligence:   normal  Medical History:   Past Medical History:  Diagnosis Date  . Abdominal wall cellulitis 2013 and 2014  . Asthma    allergic to grasses  . B12 deficiency   . Complication of anesthesia   . Costochondritis   . Diabetes mellitus without complication ([place])   . Gastric anomaly    multiple small ulcers  . GERD (gastroesophageal reflux disease)   . Herpes 06/2018   POSSIBLY IN EYE- PT TAKING VALTREX AND WILL SEE OPTHAMOLOGIST TO SEE IF VALTREX IS WORKING  . History of abnormal cervical Pap smear   . History of Clostridium difficile colitis   . History of hiatal hernia   . History of kidney stones   . Hypertension   . IBS (irritable bowel syndrome)   . Migraine   . Morbid obesity ([place])    s/p attempted gastric banding now decompressed  . PCOS (polycystic ovarian syndrome)   . PONV (postoperative nausea and vomiting)    WITH SPINAL ONLY    Psychiatric History:  No prior psychiatric history.  Family Med/Psych History:  Family History  Problem Relation Age of Onset  . Breast cancer Maternal Grandmother 35  . Diabetes Mother   . 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  Admitted on 07/15/2019 with reports of all night prior to admission with onset of HA and developed flaccid left hemiparesis a couple hours later.  CT showed acute non-hemorrhagic infarct in right BG and hyperdense R-MCA.  MRI showed acute infarct in right  basal ganglia with hemorrhagic transformation in right caudate and right lenticular nucleus extending into insula and right temporal lobe and small acute infarct in right frontal lobe and occluded R-ICA.  Patient with resultant left sided weakness with left inattention affecting morbility and ADL.    Patient reports that anxiety has improved significantly especially with the planned discharge on the 23rd.  The patient is seeing improvement in her left leg but still has spasticity in her foot.  She has been standing in therapies.  The patient reports that her left hand is still more problematic than her foot.  The patient, however, reports that as she is seeing improvements that her mood is improved and she is worrying less.  However, anxiety does continue to be a problematic situation but much improved.  Disposition/Plan:  Patient with coping issues with loss of function.    Diagnosis:    Acute ischemic right middle cerebral artery (MCA) stroke ([place]) - Plan: Ambulatory referral to Physical Medicine Rehab, Ambulatory referral  to Cardiology         Electronically Signed   _______________________ Ilean Skill, Psy.D.

## 2019-08-05 NOTE — Plan of Care (Signed)
  Problem: Sit to Stand Goal: LTG:  Patient will perform sit to stand in prep for activites of daily living with assistance level (OT) Description: LTG:  Patient will perform sit to stand in prep for activites of daily living with assistance level (OT) Flowsheets (Taken 08/05/2019 1242) LTG: PT will perform sit to stand in prep for activites of daily living with assistance level: Contact Guard/Touching assist Note: Goal downgraded 12/15 2/2 lack of progress   Problem: RH Bathing Goal: LTG Patient will bathe all body parts with assist levels (OT) Description: LTG: Patient will bathe all body parts with assist levels (OT) Flowsheets (Taken 08/05/2019 1242) LTG: Pt will perform bathing with assistance level/cueing: Contact Guard/Touching assist Note: Goal downgraded 12/15 2/2 lack of progress   Problem: RH Dressing Goal: LTG Patient will perform lower body dressing w/assist (OT) Description: LTG: Patient will perform lower body dressing with assist, with/without cues in positioning using equipment (OT) Flowsheets (Taken 08/05/2019 1242) LTG: Pt will perform lower body dressing with assistance level of: Minimal Assistance - Patient > 75% Note: Goal downgraded 12/15 2/2 lack of progress   Problem: RH Toileting Goal: LTG Patient will perform toileting task (3/3 steps) with assistance level (OT) Description: LTG: Patient will perform toileting task (3/3 steps) with assistance level (OT)  Flowsheets (Taken 08/05/2019 1242) LTG: Pt will perform toileting task (3/3 steps) with assistance level: Contact Guard/Touching assist Note: Goal downgraded 12/15 2/2 lack of progress   Problem: RH Toilet Transfers Goal: LTG Patient will perform toilet transfers w/assist (OT) Description: LTG: Patient will perform toilet transfers with assist, with/without cues using equipment (OT) Flowsheets (Taken 08/05/2019 1242) LTG: Pt will perform toilet transfers with assistance level of: Contact Guard/Touching  assist Note: Goal downgraded 12/15 2/2 lack of progress   Problem: RH Tub/Shower Transfers Goal: LTG Patient will perform tub/shower transfers w/assist (OT) Description: LTG: Patient will perform tub/shower transfers with assist, with/without cues using equipment (OT) Flowsheets (Taken 08/05/2019 1242) LTG: Pt will perform tub/shower stall transfers with assistance level of: Contact Guard/Touching assist Note: Goal downgraded 12/15 2/2 lack of progress

## 2019-08-05 NOTE — Progress Notes (Signed)
Occupational Therapy Weekly Progress Note  Patient Details  Name: Kathryn Coffey MRN: [id number] Date of Birth: June 27, 1971  Beginning of progress report period: July 23, 2019 End of progress report period: August 05, 2019  Today's Date: 08/05/2019  Session 1 OT Individual Time: 1100-1200 OT Individual Time Calculation (min): 60 min   Session 2 OT Individual Time: 1302-1400 OT Individual Time Calculation (min): 58 min    Patient has met 3 of 3 short term goals.  Pt has made steady progress towards OT goals this week. Pt is at an overall min A level for functional transfers. She has progressed with LB dressing this week using adaptive strategies and min/mod A. Balance has also improved to Min A overall. Pt has good shoulder/scapula activation and is doing better using this in functional way. Distal motor function is slow to return with no hand activation yet.   Patient continues to demonstrate the following deficits: muscle weakness, decreased cardiorespiratoy endurance, impaired timing and sequencing, abnormal tone, unbalanced muscle activation, decreased coordination and decreased motor planning,  and decreased sitting balance, decreased standing balance, decreased postural control, hemiplegia and decreased balance strategies and therefore will continue to benefit from skilled OT intervention to enhance overall performance with BADL and Reduce care partner burden.  Patient not progressing toward long term goals.  See goal revision..  Plan of care revisions: standing balance and transfer goals downgraded to CGA. Min A for LB dressing 2/2 L AFO.  OT Short Term Goals Week 2:  OT Short Term Goal 1 (Week 2): Pt will consistently transfer to toilet with min A OT Short Term Goal 2 (Week 2): Pt will complete 1/3 toileting steps OT Short Term Goal 2 - Progress (Week 2): Met OT Short Term Goal 3 (Week 2): Pt will demonstrate use of L UE as a stabilizer with min questioning cuesq OT Short  Term Goal 3 - Progress (Week 2): Met OT Short Term Goal 4 (Week 2): Pt will complete LB dressing with no more than mod A OT Short Term Goal 4 - Progress (Week 2): Met Week 3:  OT Short Term Goal 1 (Week 3): LTG=STG 2/2 ELOS  Skilled Therapeutic Interventions/Progress Updates:  Session 1   Pt greeted sitting in wc with husband present and agreeable to OT treatment session. Pt agreeable to shower today. Pt ambulated into bathroom with RW and min A. Focus on LLE positioning with each step. Pt able to turn and sit on commode with min A. Pt also able to manage clothing with CGA for balance. Pt voided bladder continently and was able to complete peri-care with set-up A while sitting on commode. Pt needed OT assist to doff pants sitting on commode. Pt then stood from commode with mod A. Pt ambulated to shower bench with RW and Min A. Bathing completed with use of LH sponge to wash LEs. Pt stood with min A in shower with CGA for balance when reaching to wash peri area and buttocks with LH sponge. Pt unable to access bottom and peri-area in sitting 2/2 body habitus. Pt needed OT assist to don L wash mit, then worked on L NMR with weight bearing while washing. Worked on hemi-dressing techniques with pt able to don shirt today with only set-up A. Issed pt a reacher today to assist with threading pant legs. Pt able to thread pants with set-up A, min A sit<>stand at the sink, then pt able to partially pull up pants. Pt reported being frustrated with arm trough. OT  adjusted L arm rest to raise up higher, then tried a 1/2 lap tray. Lap tray was able to now fit with arm rest adjustment. Pt much more comfortable with lap tray. Worked on ways to don L AFO with pt likely to need assistance at dc with this. Pt left seated in wc with spouse present and needs met.   Session 2 Pt greeted sitting in wc and reported need to urinate. Pt had not been incontinent!! Pt ambulated into bathroom w/ RW and CGA. Pt able to manage clothing  and voided continently in commode. Pt able to complete peri-care with set-up A. Min A to pull up pants after voiding. Pt ambulated back to wc in similar fashion. Pt brought down to therapy gym. L UE NMR with standing towel pushes up wedge. OT then placed e-stim for wrist extension, while continuing weight bearing towel pushes. Pt reported LE fatigue, so returned to sitting.  Baby powder placed on therapy mat and pt worked on L UE horizontal adduction and abduction across therapy mat in sitting. Pt returned to room and pivoted back bed with CGA and discussed need for prescription for saebo e-stim. Will pass info on to MD.   Therapy Documentation Precautions:  Precautions Precautions: Fall Precaution Comments: left hemiparesis. L inattention Restrictions Weight Bearing Restrictions: No Pain:  Denies pain  Therapy/Group: Individual Therapy  Valma Cava 08/05/2019, 3:23 PM

## 2019-08-05 NOTE — Plan of Care (Signed)
  Problem: Consults Goal: RH STROKE PATIENT EDUCATION Description: See Patient Education module for education specifics  Outcome: Progressing   Problem: RH BOWEL ELIMINATION Goal: RH STG MANAGE BOWEL W/MEDICATION W/ASSISTANCE Description: STG Manage Bowel with Medication with mod I Assistance. Outcome: Progressing   Problem: RH SKIN INTEGRITY Goal: RH STG MAINTAIN SKIN INTEGRITY WITH ASSISTANCE Description: STG Maintain Skin Integrity With min Assistance. Outcome: Progressing   Problem: RH SAFETY Goal: RH STG ADHERE TO SAFETY PRECAUTIONS W/ASSISTANCE/DEVICE Description: STG Adhere to Safety Precautions With Mod I Assistance/Device. Outcome: Progressing Goal: RH STG DECREASED RISK OF FALL WITH ASSISTANCE Description: STG Decreased Risk of Fall With Mod I Assistance. Outcome: Progressing   Problem: RH PAIN MANAGEMENT Goal: RH STG PAIN MANAGED AT OR BELOW PT'S PAIN GOAL Description: Less than 3 on 0-10 scale Outcome: Progressing

## 2019-08-06 ENCOUNTER — Inpatient Hospital Stay (HOSPITAL_COMMUNITY): Payer: No Typology Code available for payment source | Admitting: Physical Therapy

## 2019-08-06 ENCOUNTER — Inpatient Hospital Stay (HOSPITAL_COMMUNITY): Payer: No Typology Code available for payment source

## 2019-08-06 ENCOUNTER — Inpatient Hospital Stay (HOSPITAL_COMMUNITY): Payer: No Typology Code available for payment source | Admitting: Occupational Therapy

## 2019-08-06 LAB — GLUCOSE, CAPILLARY
Glucose-Capillary: 112 mg/dL — ABNORMAL HIGH (ref 70–99)
Glucose-Capillary: 93 mg/dL (ref 70–99)
Glucose-Capillary: 129 mg/dL — ABNORMAL HIGH (ref 70–99)
Glucose-Capillary: 96 mg/dL (ref 70–99)

## 2019-08-06 MED ORDER — TIZANIDINE HCL 2 MG PO TABS
4.0000 mg | ORAL_TABLET | Freq: Every day | ORAL | Status: DC
  Administered 2019-08-06 – 2019-08-12 (×7): 4 mg via ORAL
  Filled 2019-08-06 (×6): qty 2

## 2019-08-06 NOTE — Progress Notes (Signed)
Recreational Therapy Session Note  Patient Details  Name: Kathryn Coffey MRN: [id number] Date of Birth: [birth year]Today's Date: 08/06/2019  Pain: no c/o Skilled Therapeutic Interventions/Progress Updates: Session focused on discharge planning & use of leisure time at discharge.  Pt stated she wanted to get back to her recently remodeled kitchen to cook/bake as she really enjoyed this PTA.  Discussed a simple Christmas themed  kitchen task with plans to make this treat prior to discharge with a focus on activity modification.  Pt agreeable.  Therapy/Group: Individual Therapy Braelen Sproule 08/06/2019, 3:42 PM

## 2019-08-06 NOTE — Progress Notes (Signed)
Physical Therapy Session Note  Patient Details  Name: Kathryn Coffey MRN: [id number] Date of Birth: 1971/04/09  Today's Date: 08/06/2019 PT Individual Time: [id number] PT Individual Time Calculation (min): 24 min   Short Term Goals: Week 3:  PT Short Term Goal 1 (Week 3): STG = LTG due to estimated d/c date.  Skilled Therapeutic Interventions/Progress Updates:  Pt received in w/c & agreeable to tx. Transported pt to ortho gym via w/c dependent assist for time management. Pt completes car transfer at SUV simulated height with min assist with ongoing cuing for sequencing and technique and assistance for placing LLE in/out of car. Pt then ambulates 75 ft with RW & min assist with ongoing cuing for normal step width BLE, step through pattern and increased weight bearing LLE, and focusing on LLE foot placement. Pt demonstrates LLE knee hyperextension with increased step length and step through pattern. Notified PT that's seeing pt later to try heel wedge. At end of session pt left in w/c with chair alarm donned, all needs in reach, & husband present in room.   Therapy Documentation Precautions:  Precautions Precautions: Fall Precaution Comments: left hemiparesis. L inattention Restrictions Weight Bearing Restrictions: No  Pain: Pt denies c/o pain.   Therapy/Group: Individual Therapy  Waunita Schooner 08/06/2019, 12:32 PM

## 2019-08-06 NOTE — Progress Notes (Signed)
Occupational Therapy Session Note  Patient Details  Name: Kathryn Coffey MRN: [id number] Date of Birth: 08-30-1970  Today's Date: 08/06/2019 OT Individual Time: 0946-1100 OT Individual Time Calculation (min): 74 min    Short Term Goals: Week 3:  OT Short Term Goal 1 (Week 3): LTG=STG 2/2 ELOS  Skilled Therapeutic Interventions/Progress Updates:    Pt greeted seated in wc and agreeable to OT treatment session. Pt reprted need to go to the bathroom but declined bathing/dressing. Pt ambulated to bathroom w/ RW and CGA,  Min A when stepping over threshold and when turning to sit on commode. Pt able to doff pants then sit down on commode. Pt voided bladder and completed peri-care with set-up A. Pt ambulated out of bathroom in similar fashion. Pt completed hand washing at the sink with set-up A. Pt brought to therapy gym and completed stand-pivot to mat with CGA. L UE NMR with weight bearing through L hand and shoulder with OT providing elbow support. Continued with weight bearing activity while L e-stim placed. Incorporated R UE wrist extension at the same time as e-stim for L. OT could feel trace muscle activation in hands, but pt could not elicit any movement. Pt returned to room and left seated in wc with L UE on lap tray, husband present, and needs met.  Therapy Documentation Precautions:  Precautions Precautions: Fall Precaution Comments: left hemiparesis. L inattention Restrictions Weight Bearing Restrictions: No  Pain:  denies pain  Therapy/Group: Individual Therapy  Valma Cava 08/06/2019, 10:39 AM

## 2019-08-06 NOTE — Progress Notes (Signed)
Physical Therapy Session Note  Patient Details  Name: Kathryn Coffey MRN: [id number] Date of Birth: 1971-07-27  Today's Date: 08/06/2019 PT Individual Time: [id number] PT Individual Time Calculation (min): 57 min   Short Term Goals:  Week 3:  PT Short Term Goal 1 (Week 3): STG = LTG due to estimated d/c date.     Skilled Therapeutic Interventions/Progress Updates:  Pt sitting EOB.  Her husband was attempting to don pt's LADO and shoe.  PT educated pt and husband about importance of sitting on flat bed, with L foot flat on floor and knee flexed, to don both AFO and shoe.  Both voiced understanding.   PT completed.  PT added a heel lift in L shoe, as well, to address knee hyperextension, even when wearing AFO, during gait training.  Sit> stand iwht supervision.  Stand pivot with RW bed> w/c to L, CGA and mod cues for attending to LLE.  neuromuscular re-education via forced use, multimodal cues for propulsion of w/c, using bil LEs only x 50'.  Pt performed with supervision, mod cues for attention to LLE, with improved technique during task. On firm mat, further neuromuscular re-education in supine for bil bridging, L unilateral bridging.  In R side lying: bil hip flexion/extension, L isolated hip extension with flexed knee, and hip abduction via clam shells (flexed hips and knees).   Segmental lateral scooting on mat for upper trunk/lower trunk dissociation, to ensure that she had enough room to roll over.  Cues for hemi techniques for LUE and LLE for rolling L/R with supervision.  R side lying> sitting with cues to flex hips to chest as much as possible, then kick feet off of mat, then push up with RUE, min assist.   Pt reported she felt that she had had some leakage.  Gait training distance limited due to this, x 20' with CGA, RW, LAFO.   At end of session, PT instructed husband further about use of T strap on AFO.  Pt to use call bell to ask for help changing clothes and transferring back  to bed.  PT informed husband not to do this, as he has not been checked off on Safety Plan.  Pt and husband voiced understanding.      Therapy Documentation Precautions:  Precautions Precautions: Fall Precaution Comments: left hemiparesis. L inattention Restrictions Weight Bearing Restrictions: No  Pain: no c/o         Therapy/Group: Individual Therapy  Tyler Cubit 08/06/2019, 4:26 PM

## 2019-08-06 NOTE — Progress Notes (Signed)
Physical Therapy Session Note  Patient Details  Name: Kathryn Coffey MRN: [id number] Date of Birth: 02-01-1971  Today's Date: 08/06/2019 PT Individual Time: 0805-0900 PT Individual Time Calculation (min): 55 min   Short Term Goals: Week 3:  PT Short Term Goal 1 (Week 3): STG = LTG due to estimated d/c date.  Skilled Therapeutic Interventions/Progress Updates:  Pt received in w/c & agreeable to tx. Therapist provides total assist for threading pants on BLE & donning B shoes & L AFO. Pt transfers sit<>stand with CGA and is able to pull pants over hips with CGA for standing balance. Transported pt to gym via w/c dependent assist for time management. Pt ambulates ~55 ft with RW & min assist with 1-2 instances of L knee hyperextension with ongoing education & cuing for normal stance as pt stands with RLE in center of RW and pt primarily weight bearing through RLE. Also focused on step through pattern and increased weight bearing through LLE with pt demonstrating some improvement in this. Pt then transfers to [place] and uses device in standing with RUE support and min<>mod assist for balance with therapist blocking L knee to prevent buckling & assisting with providing neutral alignment as LLE tends to significantly externally rotate, with pt maintaining L knee flexion throughout with task focusing on weight shifting L and LLE strengthening & NMR. Pt requires seated rest breaks PRN 2/2 fatigue. Pt requires cuing for terminal LLE knee extension when weight shifting to L. At end of session pt left in w/c with chair alarm donned & all needs at hand.  Therapy Documentation Precautions:  Precautions Precautions: Fall Precaution Comments: left hemiparesis. L inattention Restrictions Weight Bearing Restrictions: No  Pain: No c/o pain reported.   Therapy/Group: Individual Therapy  Waunita Schooner 08/06/2019, 9:05 AM

## 2019-08-06 NOTE — Progress Notes (Addendum)
Kathryn Coffey PHYSICAL MEDICINE & REHABILITATION PROGRESS NOTE   Subjective/Complaints:  Bladder seems to be slowing down. Moved bowels this am. Had spasm on left last night, PRAFO rubbed up against right foot, was uncomfortable  ROS: Patient denies fever, rash, sore throat, blurred vision, nausea, vomiting, diarrhea, cough, shortness of breath or chest pain,  headache, or mood change. .   Objective:   No results found. Recent Labs    08/04/19 0603  WBC 11.4*  HGB 9.7*  HCT 32.2*  PLT 508*   Recent Labs    08/04/19 0603  NA 140  K 3.9  CL 107  CO2 22  GLUCOSE 105*  BUN 13  CREATININE 0.72  CALCIUM 9.2    Intake/Output Summary (Last 24 hours) at 08/06/2019 0829 Last data filed at 08/06/2019 0752 Gross per 24 hour  Intake 1280 ml  Output --  Net 1280 ml     Physical Exam: Vital Signs Blood pressure 133/67, pulse 77, temperature 98.3 F (36.8 C), resp. rate 17, height 5\' 5"  (1.651 m), weight 116.5 kg, SpO2 98 %. Constitutional: No distress . Vital signs reviewed. HEENT: EOMI, oral membranes moist Neck: supple Cardiovascular: RRR without murmur. No JVD    Respiratory: CTA Bilaterally without wheezes or rales. Normal effort    GI: BS +, non-tender, non-distended  Skin: intact Neuro: AOx3. Left facial droop. CN 2-12 otherwise intact.  Sensation 1+/2 LUE and LLE--stable 5/5 strength on right side. LUE: 1/5 pec/bicep--tr to 0/5 distal.stable exam LLE: 3- to 3/5 HF, KE, ADF stable at present Musc: improved right shoulder ROM A/P Psych: in good spirits.   Assessment/Plan: 1. Functional deficits secondary to acute ischemic right MCA stroke which require 3+ hours per day of interdisciplinary therapy in a comprehensive inpatient rehab setting.  Physiatrist is providing close team supervision and 24 hour management of active medical problems listed below.  Physiatrist and rehab team continue to assess barriers to discharge/monitor patient progress toward functional  and medical goals  Care Tool:  Bathing    Body parts bathed by patient: Chest, Abdomen, Right upper leg, Left upper leg, Right lower leg, Face   Body parts bathed by helper: Front perineal area, Buttocks, Left lower leg, Left arm, Right lower leg     Bathing assist Assist Level: Moderate Assistance - Patient 50 - 74%     Upper Body Dressing/Undressing Upper body dressing   What is the patient wearing?: Bra, Pull over shirt    Upper body assist Assist Level: Moderate Assistance - Patient 50 - 74%    Lower Body Dressing/Undressing Lower body dressing      What is the patient wearing?: Pants, Incontinence brief     Lower body assist Assist for lower body dressing: Maximal Assistance - Patient 25 - 49%     Toileting Toileting    Toileting assist Assist for toileting: Maximal Assistance - Patient 25 - 49%     Transfers Chair/bed transfer  Transfers assist     Chair/bed transfer assist level: Minimal Assistance - Patient > 75%     Locomotion Ambulation   Ambulation assist      Assist level: Minimal Assistance - Patient > 75% Assistive device: Walker-rolling Max distance: 20 ft   Walk 10 feet activity   Assist     Assist level: Minimal Assistance - Patient > 75% Assistive device: Walker-rolling   Walk 50 feet activity   Assist Walk 50 feet with 2 turns activity did not occur: Safety/medical concerns  Assist level: Minimal  Assistance - Patient > 75% Assistive device: Walker-rolling    Walk 150 feet activity   Assist Walk 150 feet activity did not occur: Safety/medical concerns         Walk 10 feet on uneven surface  activity   Assist Walk 10 feet on uneven surfaces activity did not occur: Safety/medical concerns         Wheelchair     Assist Will patient use wheelchair at discharge?: Yes Type of Wheelchair: Manual    Wheelchair assist level: Supervision/Verbal cueing Max wheelchair distance: 100 ft    Wheelchair 50  feet with 2 turns activity    Assist        Assist Level: Supervision/Verbal cueing   Wheelchair 150 feet activity     Assist  Wheelchair 150 feet activity did not occur: Safety/medical concerns       Blood pressure 133/67, pulse 77, temperature 98.3 F (36.8 C), resp. rate 17, height 5\' 5"  (1.651 m), weight 116.5 kg, SpO2 98 %.    Medical Problem List and Plan: 1.  Impaired mobility and strength secondary to R MCA infarct             -patient may shower             -Continue CIR therapies including PT, OT, and SLP   -left WHO and PRAFO at night   -making consistent progress  2.  Antithrombotics: -DVT/anticoagulation:  Pharmaceutical: Lovenox             -antiplatelet therapy: ASA/Brillinta 3. Chronic R>L knee pain/Pain Management: will add Sportscreme prn. May need right knee sleeve for support.              Chronic right shoulder scapular dyskinesis--likely worsened by increased use due to flaccid left extremity. No real benefits with TPI's. Rhomboids most involved  -tizanidine  QHS only to help limit sedation, will increase to 4mg  at HS to help with left foot, hand spasms  -continue kpad  -Scapular ROM with therapies   -lidoderm patch    4. Mood: LCSW to follow for evaluation and support.              -antipsychotic agents: N/A             -resumed wellbutrin per home dose  -resumed her adderall per home dose which she uses for impulsivity  -appreciate neuropsych eval  -continue trazodone to help with sleep 5. Neuropsych: This patient is capable of making decisions on her own behalf. 6. Skin/Wound Care: Routine pressure relief measures.  7. Fluids/Electrolytes/Nutrition: Monitor I/O.   -encourage PO   -discussed with pt that we can use multivit, fe+ but not her proprietary mixture 8. Chronic HA: Takes goody powders/Motrin/Ultram. Continue tylenol prn.   - low dose topamax added--continue for now 9. T2DM: Hgb A1c-8.0.  Monitor diet.   Resumed home dose  metformin and Victoza    -cbg's low normal 11. H/o band failure/gastric sleeve: Continue multivitamin. Appetite has been good.  12.  Sinus pressure: Felt to be due to allergies. began Flonase and allegra to help manage symptoms. (explained that these were formulary meds and should be equivalent)  -no allergy shots while in hospital d/t compounded nature 13. Bowels and bladder:  -12/16 some improvement  -timed voids  -PVR scans are low .   -continue trial of myrbetriq 25mg  daily for now  E Coli UTI: macrobid started 12/14--continue for 7 days.    LOS: 15 days A FACE TO FACE EVALUATION  WAS PERFORMED  Meredith Staggers 08/06/2019, 8:29 AM

## 2019-08-06 NOTE — Plan of Care (Signed)
  Problem: RH BOWEL ELIMINATION Goal: RH STG MANAGE BOWEL W/MEDICATION W/ASSISTANCE Description: STG Manage Bowel with Medication with mod I Assistance. Outcome: Progressing   Problem: RH SKIN INTEGRITY Goal: RH STG MAINTAIN SKIN INTEGRITY WITH ASSISTANCE Description: STG Maintain Skin Integrity With min Assistance. Outcome: Progressing   Problem: RH SAFETY Goal: RH STG ADHERE TO SAFETY PRECAUTIONS W/ASSISTANCE/DEVICE Description: STG Adhere to Safety Precautions With Mod I Assistance/Device. Outcome: Progressing Goal: RH STG DECREASED RISK OF FALL WITH ASSISTANCE Description: STG Decreased Risk of Fall With Mod I Assistance. Outcome: Progressing   Problem: RH PAIN MANAGEMENT Goal: RH STG PAIN MANAGED AT OR BELOW PT'S PAIN GOAL Description: Less than 3 on 0-10 scale Outcome: Progressing

## 2019-08-07 ENCOUNTER — Inpatient Hospital Stay (HOSPITAL_COMMUNITY): Payer: No Typology Code available for payment source

## 2019-08-07 ENCOUNTER — Inpatient Hospital Stay (HOSPITAL_COMMUNITY): Payer: No Typology Code available for payment source | Admitting: Occupational Therapy

## 2019-08-07 LAB — GLUCOSE, CAPILLARY
Glucose-Capillary: 93 mg/dL (ref 70–99)
Glucose-Capillary: 134 mg/dL — ABNORMAL HIGH (ref 70–99)
Glucose-Capillary: 82 mg/dL (ref 70–99)
Glucose-Capillary: 94 mg/dL (ref 70–99)

## 2019-08-07 NOTE — Progress Notes (Signed)
Physical Therapy Session Note  Patient Details  Name: Kathryn Coffey MRN: [id number] Date of Birth: 1970/10/23  Today's Date: 08/07/2019 PT Individual Time: 1500-1600 PT Individual Time Calculation (min): 60 min   Short Term Goals:  Week 3:  PT Short Term Goal 1 (Week 3): STG = LTG due to estimated d/c date.  Skilled Therapeutic Interventions/Progress Updates:  Pt seated in w/c.  No c/o pain.  L AFO already donned.    With cue, pt locks/unlocks R brake of wc, and with hand- over -hand assistance, locks/unlocks L brake with L hand.    Stand pivot wc> mat with CGA with RW.    neuromuscular re-education via demo, multimodal cues for seated: 15 x 2 bil adductor squeezes against bolster, 15 x 1 bil scapular retraction. L lateral leans over bolster in sitting, for wt bearing LUE. In R side lying: L clam shells for hip abducction, PNF for L pelvic anterior elevation/posterior depression.  Pt had excellent recruitment of pelvic muslces, but fair/poor carry over into gait.  Reciprocal scooting sitting on mat,  for pelvic dissociation, forward/backward.  Seated activity reaching out of BOS with R/L hand (assist with LUE support) to facilitate L trunk shortening/lengthneing/rotating.  Sit> supine with supervision.  Rolling L><R iwht supervision and cues.  Supine> R side lying with cues for hemi technique and L hemibody attention.  R side lying> sitting with CGA for trunk, cues for bil hip flexion, then feet off of mat, then push up with RUE.    Gait training (4) 6" high steps, L rail as per home set-up, mod assist overall for mgt of LUE on railing, L foot positioning at times, mod cues for technique for safety.  Pt was pleased with improvements on stairs.   At end of session, pt in w/c with husband attending her.     Therapy Documentation Precautions:  Precautions Precautions: Fall Precaution Comments: left hemiparesis. L inattention Restrictions Weight Bearing Restrictions: No        Therapy/Group: Individual Therapy  Sila Sarsfield 08/07/2019, 4:25 PM

## 2019-08-07 NOTE — Progress Notes (Signed)
Social Work Patient ID: Kathryn Coffey, female   DOB: 1971/02/11, 48 y.o.   MRN: [id number]  Met with pt and spouse yesterday to review team conference.  Both aware and agreeable with targeted d/c date still for 12/23, however, pt prefers earlier and spouse prefers later than that date.  Have reviewed [place] and DME referrals.  Pleased with progress.  Will continue to follow.  Tyrah Broers, LCSW

## 2019-08-07 NOTE — Plan of Care (Signed)
  Problem: Consults Goal: RH STROKE PATIENT EDUCATION Description: See Patient Education module for education specifics  Outcome: Progressing   Problem: RH BOWEL ELIMINATION Goal: RH STG MANAGE BOWEL W/MEDICATION W/ASSISTANCE Description: STG Manage Bowel with Medication with mod I Assistance. Outcome: Progressing   Problem: RH SKIN INTEGRITY Goal: RH STG MAINTAIN SKIN INTEGRITY WITH ASSISTANCE Description: STG Maintain Skin Integrity With min Assistance. Outcome: Progressing   Problem: RH SAFETY Goal: RH STG ADHERE TO SAFETY PRECAUTIONS W/ASSISTANCE/DEVICE Description: STG Adhere to Safety Precautions With Mod I Assistance/Device. Outcome: Progressing Goal: RH STG DECREASED RISK OF FALL WITH ASSISTANCE Description: STG Decreased Risk of Fall With Mod I Assistance. Outcome: Progressing   Problem: RH PAIN MANAGEMENT Goal: RH STG PAIN MANAGED AT OR BELOW PT'S PAIN GOAL Description: Less than 3 on 0-10 scale Outcome: Progressing

## 2019-08-07 NOTE — Progress Notes (Signed)
  Patient ID: NATILYN VALVANO, female   DOB: 11/19/1970, 48 y.o.   MRN: [id number]    Diagnosis codes:  I63.411;  I61.8; I69.354  Height:    5'5"            Weight:     273 lbs       Patient suffers from significant left hemiplegia followinga right CVA and ICH which impairs their ability to perform daily activities like bathing, dressing and mobility in the home.  A walker will not resolve issue with performing activities of daily living.  A wheelchair will allow patient to safely perform daily activities.  Patient is not able to propel themselves in the home using a standard weight wheelchair due to hemiplegia.  Patient can self propel in the lightweight wheelchair.   Reesa Chew, PA-C

## 2019-08-07 NOTE — Progress Notes (Signed)
Physical Therapy Session Note  Patient Details  Name: Kathryn Coffey MRN: [id number] Date of Birth: 01/23/1971  Today's Date: 08/07/2019 PT Individual Time: 1303-1403 PT Individual Time Calculation (min): 60 min   Short Term Goals: Week 2:  PT Short Term Goal 1 (Week 2): Pt will ambulate 75 ft with LRAD & min assist. PT Short Term Goal 1 - Progress (Week 2): Not met PT Short Term Goal 2 (Week 2): Pt will initiate stair training. PT Short Term Goal 2 - Progress (Week 2): Not met PT Short Term Goal 3 (Week 2): Pt will complete car transfer with LRAD & min assist. PT Short Term Goal 3 - Progress (Week 2): Met Week 3:  PT Short Term Goal 1 (Week 3): STG = LTG due to estimated d/c date.  Skilled Therapeutic Interventions/Progress Updates:    Pt reported just using bathroom prior to therapy session. NMR during w/c mobility with BLE only with focus on coordination and timing of activation of LLE to self propel - requires mod cues for slowing speed to focus on controlling movement of LLE and attention to it's placement not letting it get stuck under the wheelchair. Pt performs CGA sit <> stands with RW and transfers throughout session with cues for correct hand placement and attention to LUE management during transitional movements. NMR during gait trial x 60' with focus on controlled LLE placement and knee control in stance (2 episodes of L knee hyperextending). Tendency for L foot to ER and hitting RW. Bed mobility NMR re-training to L side per pt report this is where she would prefer to sleep at home, requiring min assist for sit -> sidelying -> rolling to the R (min assist due to LUE management) -> rolling L (supervision) and mod assist for L sidelying to sit with facilitation at trunk and cues for use of RUE to push through. In R sidelying using powder board focused on hip flexion/extension, knee flexion/extension, clamshells x 15-20 reps with assist achieving full ROM. In supine performed isometric  hip adduction squeezes with ball and cues and education on activation through pelvic floor and use of breath (activation during exhale). Educated on carryover of this to transitional movements as well. End of session transferred to recliner with CGA using RW and cues for attention to L hand placement and positioning before sitting. All needs in reach and husband in room.  Therapy Documentation Precautions:  Precautions Precautions: Fall Precaution Comments: left hemiparesis. L inattention Restrictions Weight Bearing Restrictions: No Pain:  Denies pain.   Therapy/Group: Individual Therapy  Canary Brim Ivory Broad, PT, DPT, CBIS  08/07/2019, 3:02 PM

## 2019-08-07 NOTE — Progress Notes (Signed)
[place] PHYSICAL MEDICINE & REHABILITATION PROGRESS NOTE   Subjective/Complaints:  Bladder slowing down. Had a better night with decreased spasms. Walked 75'  ROS: Patient denies fever, rash, sore throat, blurred vision, nausea, vomiting, diarrhea, cough, shortness of breath or chest pain, joint or back pain, headache, or mood change.   Objective:   No results found. No results for input(s): WBC, HGB, HCT, PLT in the last 72 hours. No results for input(s): NA, K, CL, CO2, GLUCOSE, BUN, CREATININE, CALCIUM in the last 72 hours.  Intake/Output Summary (Last 24 hours) at 08/07/2019 1045 Last data filed at 08/07/2019 0821 Gross per 24 hour  Intake 642 ml  Output --  Net 642 ml     Physical Exam: Vital Signs Blood pressure 122/72, pulse 66, temperature 97.7 F (36.5 C), resp. rate 17, height 5\' 5"  (1.651 m), weight 116.5 kg, SpO2 96 %. Constitutional: No distress . Vital signs reviewed. HEENT: EOMI, oral membranes moist Neck: supple Cardiovascular: RRR without murmur. No JVD    Respiratory: CTA Bilaterally without wheezes or rales. Normal effort    GI: BS +, non-tender, non-distended   Skin: intact Neuro: AOx3. Mild Left facial droop. CN 2-12 otherwise intact.  Sensation 1+/2 LUE and LLE--stable 5/5 strength on right side. LUE: 1/5 pec/bicep--tr to 0/5 no changes LLE: 3- to 3/5 HF, KE, ADF stable at present Musc: improved right shoulder ROM A/P Psych: pleasant  Assessment/Plan: 1. Functional deficits secondary to acute ischemic right MCA stroke which require 3+ hours per day of interdisciplinary therapy in a comprehensive inpatient rehab setting.  Physiatrist is providing close team supervision and 24 hour management of active medical problems listed below.  Physiatrist and rehab team continue to assess barriers to discharge/monitor patient progress toward functional and medical goals  Care Tool:  Bathing    Body parts bathed by patient: Chest, Abdomen, Right upper  leg, Left upper leg, Right lower leg, Face   Body parts bathed by helper: Front perineal area, Buttocks, Left lower leg, Left arm, Right lower leg     Bathing assist Assist Level: Moderate Assistance - Patient 50 - 74%     Upper Body Dressing/Undressing Upper body dressing   What is the patient wearing?: Bra, Pull over shirt    Upper body assist Assist Level: Moderate Assistance - Patient 50 - 74%    Lower Body Dressing/Undressing Lower body dressing      What is the patient wearing?: Pants, Incontinence brief     Lower body assist Assist for lower body dressing: Maximal Assistance - Patient 25 - 49%     Toileting Toileting    Toileting assist Assist for toileting: Maximal Assistance - Patient 25 - 49%     Transfers Chair/bed transfer  Transfers assist     Chair/bed transfer assist level: Contact Guard/Touching assist     Locomotion Ambulation   Ambulation assist      Assist level: Contact Guard/Touching assist Assistive device: Walker-rolling Max distance: 20   Walk 10 feet activity   Assist     Assist level: Contact Guard/Touching assist Assistive device: Walker-rolling, Orthosis   Walk 50 feet activity   Assist Walk 50 feet with 2 turns activity did not occur: Safety/medical concerns  Assist level: Minimal Assistance - Patient > 75% Assistive device: Walker-rolling    Walk 150 feet activity   Assist Walk 150 feet activity did not occur: Safety/medical concerns         Walk 10 feet on uneven surface  activity  Assist Walk 10 feet on uneven surfaces activity did not occur: Safety/medical concerns         Wheelchair     Assist Will patient use wheelchair at discharge?: Yes Type of Wheelchair: Manual    Wheelchair assist level: Supervision/Verbal cueing Max wheelchair distance: 100 ft    Wheelchair 50 feet with 2 turns activity    Assist        Assist Level: Supervision/Verbal cueing   Wheelchair 150 feet  activity     Assist  Wheelchair 150 feet activity did not occur: Safety/medical concerns       Blood pressure 122/72, pulse 66, temperature 97.7 F (36.5 C), resp. rate 17, height 5\' 5"  (1.651 m), weight 116.5 kg, SpO2 96 %.    Medical Problem List and Plan: 1.  Impaired mobility and strength secondary to R MCA infarct             -patient may shower             -Continue CIR therapies including PT, OT, and SLP   -left WHO and PRAFO at night  -progressing with gait. AFO has improved gait quality  2.  Antithrombotics: -DVT/anticoagulation:  Pharmaceutical: Lovenox             -antiplatelet therapy: ASA/Brillinta 3. Chronic R>L knee pain/Pain Management: will add Sportscreme prn. May need right knee sleeve for support.              Chronic right shoulder scapular dyskinesis--likely worsened by increased use due to flaccid left extremity. No real benefits with TPI's. Rhomboids most involved  -tizanidine  4mg  QHS only to help limit sedation,  to help with left foot, hand spasms  -continue kpad  -Scapular ROM with therapies   -lidoderm patch    4. Mood: LCSW to follow for evaluation and support.              -antipsychotic agents: N/A             -resumed wellbutrin per home dose  -resumed her adderall per home dose which she uses for impulsivity  -appreciate neuropsych eval  -continue trazodone to help with sleep 5. Neuropsych: This patient is capable of making decisions on her own behalf. 6. Skin/Wound Care: Routine pressure relief measures.  7. Fluids/Electrolytes/Nutrition: Monitor I/O.   -encourage PO   -discussed with pt that we can use multivit, fe+ but not her proprietary mixture 8. Chronic HA: Takes goody powders/Motrin/Ultram. Continue tylenol prn.   - low dose topamax added--continue for now 9. T2DM: Hgb A1c-8.0.  Monitor diet.   Resumed home dose metformin and Victoza    -cbg's low normal 11. H/o band failure/gastric sleeve: Continue multivitamin. Appetite has been  good.  12.  Sinus pressure: Felt to be due to allergies. began Flonase and allegra to help manage symptoms. (explained that these were formulary meds and should be equivalent)  -no allergy shots while in hospital d/t compounded nature 13. Bowels and bladder:  -12/17 slowly improving, still incontinent  -timed voids  -PVR scans are low .   -continue trial of myrbetriq 25mg  daily for now  E Coli UTI: macrobid started 12/14--continue for 7 days.    LOS: 16 days A FACE TO FACE EVALUATION WAS PERFORMED  Kathryn Coffey 08/07/2019, 10:45 AM

## 2019-08-07 NOTE — Progress Notes (Signed)
Occupational Therapy Session Note  Patient Details  Name: Kathryn Coffey MRN: [id number] Date of Birth: 09-Mar-1971  Today's Date: 08/07/2019 OT Individual Time: 0900-1000 OT Individual Time Calculation (min): 60 min    Short Term Goals: Week 1:  OT Short Term Goal 1 (Week 1): Pt will manage LUE prior to transfers with no VC to demo improved L attention OT Short Term Goal 1 - Progress (Week 1): Met OT Short Term Goal 2 (Week 1): Pt will consistently transfer to toilet with MIN A OT Short Term Goal 2 - Progress (Week 1): Progressing toward goal OT Short Term Goal 3 (Week 1): Pt will don shirt wiht MIN A OT Short Term Goal 3 - Progress (Week 1): Met Week 2:  OT Short Term Goal 1 (Week 2): Pt will consistently transfer to toilet with min A OT Short Term Goal 2 (Week 2): Pt will complete 1/3 toileting steps OT Short Term Goal 2 - Progress (Week 2): Met OT Short Term Goal 3 (Week 2): Pt will demonstrate use of L UE as a stabilizer with min questioning cuesq OT Short Term Goal 3 - Progress (Week 2): Met OT Short Term Goal 4 (Week 2): Pt will complete LB dressing with no more than mod A OT Short Term Goal 4 - Progress (Week 2): Met Week 3:  OT Short Term Goal 1 (Week 3): LTG=STG 2/2 ELOS Week 4:     Skilled Therapeutic Interventions/Progress Updates:    1:1 Pt up and in the chair when Ot arrived. Pt had already put on her makeup and curled her hair on her own!!  Focus on dressing sit to stand; using hemi dressing techniques taught. Pt able to don shirt with setup A with extra time. (Pt already wearing bra and depend). Pt able to thread pants with extra time and pull up with steadying A. Pt able to don bilateral socks with setup. Pt able to don right shoe but requires A for left shoe with AFO.  Pt ambulated from her room with RW ~ 40 feet down the hallway with min guard with cues for weight shifting and maintaining hip and knee terminal extension with stepping through with right LE (without  "hopping"); used cue to step quietly with right shoe.   Taken to ADL apartment. Pt performed functional ambulation on the low pile carpet with RW with  Min guard again with min cues. Performed bed mobility - getting in on left side of bed. Pt able to get into supine with min guard and then onto her right side to "sleep" as she would at home with min A to be positioned straight. Discussed recommendations for toileting in the night including clothing and BSC at bedside. Pt able to get up into sitting from supine with min A with cues for sequencing and placement of right hand to assist self.   Ambulated with min guard with RW over to recliner. Pt able to perform sit to stand from rocking recliner with min guard with extra time. Pt able to propel her w/c short distance with bilateral LEs ~ 30 feet with setup.   Therapy Documentation Precautions:  Precautions Precautions: Fall Precaution Comments: left hemiparesis. L inattention Restrictions Weight Bearing Restrictions: No Pain: Pain Assessment Pain Scale: 0-10 Pain Score: 0-No pain   Therapy/Group: Individual Therapy  [name] [facility] 08/07/2019, 12:25 PM

## 2019-08-08 ENCOUNTER — Inpatient Hospital Stay (HOSPITAL_COMMUNITY): Payer: No Typology Code available for payment source | Admitting: Physical Therapy

## 2019-08-08 ENCOUNTER — Inpatient Hospital Stay (HOSPITAL_COMMUNITY): Payer: No Typology Code available for payment source | Admitting: Occupational Therapy

## 2019-08-08 LAB — GLUCOSE, CAPILLARY
Glucose-Capillary: 106 mg/dL — ABNORMAL HIGH (ref 70–99)
Glucose-Capillary: 93 mg/dL (ref 70–99)
Glucose-Capillary: 94 mg/dL (ref 70–99)
Glucose-Capillary: 97 mg/dL (ref 70–99)

## 2019-08-08 NOTE — Progress Notes (Signed)
Pt slept well during night, continues to need encouragement to monitor her left arm during transfers and while laying down.

## 2019-08-08 NOTE — Progress Notes (Signed)
Recreational Therapy Session Note  Patient Details  Name: Kathryn Coffey MRN: [id number] Date of Birth: 03/24/1971 Today's Date: 08/08/2019 Time:  11-12  Pain: no c/o Skilled Therapeutic Interventions/Progress Updates: Session focused on activity tolerance, standing balance, activity modifications/education for simple kitchen task during co-treat with OT.  Pt stood with RW & ambulated ~10' with Min assist.  Pt stood to retrieve items from cabinet shelving stabilizing through LUE on RW.  Dycem used to secure bowl to countertop, pt poured and mixed ingredients for no bake recipe with set up assistance & contact guard assist for balance..  Pt transported bowl of mixed ingredients from the countertop to kitchen table from a w/c level using half lap tray with supervision, min cues.  Education provided on kitchen safety and set up to promote independence at home. Therapy/Group: Co-Treatment Angeliki Mates 08/08/2019, 12:15 PM

## 2019-08-08 NOTE — Progress Notes (Signed)
Physical Therapy Session Note  Patient Details  Name: Kathryn Coffey MRN: [id number] Date of Birth: [birth year]  Today's Date: 08/08/2019 PT Individual Time: 0930-1030 PT Individual Time Calculation (min): 60 min   Short Term Goals: Week 3:  PT Short Term Goal 1 (Week 3): STG = LTG due to estimated d/c date.  Skilled Therapeutic Interventions/Progress Updates: Patient presents supine in bed but agreeable to therapy.  Patient transfers sup to sit w/ CGA, but verbal cues for attention to left UE.  Patient impulsive and pulls up using side rail.  Patient sat at EOB and donned pants and assist needed for donning left AFO and shoes and socks.  Patient performed sit to stand w/ CGA/SBA and for pulling up/management of pants.  Patient performed step-pivot to w/c w/ RW and CGA/SBA.  Patient able to negotiate w/c in room and hallways up to 50' using bilateral LEs for improved reciprocal movement.  Patient required verbal cues for scoot forward to front of w/c and for attention to left LE.  Patient amb multiple trials w/ RW and CGA w/ 40% verbal cues for left LE placement, especially to decrease ER.  Patient amb up to 85' including turns to return to seat.  Education on stair negotiation, as right hand rail is being installed today.  Patient negotiated 4 stairs w/ right rail ascending and left descending .  Patient able to perform w/ min A and encouragement straight (vs facing rail).  Patient tearful during therapy re: progress.  Encouragement given on progress.     Therapy Documentation Precautions:  Precautions Precautions: Fall Precaution Comments: left hemiparesis. L inattention Restrictions Weight Bearing Restrictions: No General:   Vital Signs:   Pain:      Therapy/Group: Individual Therapy  Kathryn Coffey 08/08/2019, 12:57 PM

## 2019-08-08 NOTE — Progress Notes (Signed)
Occupational Therapy Session Note  Patient Details  Name: Kathryn Coffey MRN: [id number] Date of Birth: September 27, 1970  Today's Date: 08/08/2019  Session 1 OT Individual Time: 1100-1200 OT Individual Time Calculation (min): 60 min   Session 2 OT Individual Time: 1405-1506 OT Individual Time Calculation (min): 61 min   Short Term Goals: Week 3:  OT Short Term Goal 1 (Week 3): LTG=STG 2/2 ELOS  Skilled Therapeutic Interventions/Progress Updates:    Session 1 Pt greeted sitting in wc and agreeable to OT treatment session focused on simple meal prep in rehab kitchen. Pt brought to therapy apartment in wc for time management. Cotx with RT throughout session. OT educated on RW management and positioning at counters to access upper cabinets safely. OT provided pt with RW bag to help transport items and also educated on using counter to push items down to desired location. Discussed pt's home kitchen set-up and problem solved positioning and ease of access to cooking items. Pt ambulated throughout session w/ RW and CGA/min A. Worked on standing balance and endurance while standing to stir ingredients. Educated on use of dysom to hold bowl still while stirring with one hand. Pt needed verbal cues and facilitation to shift weight more onto LLE in standing. Pt able to place bowl on 1/2 lap tray and propel wc over to the table without assistance. OT and RT helped pt scoop out ingredients onto wax paper for time management. Pt reports feeling more confident to complete cooking tasks at home with family. Pt propelled wc back to room with supervision. Pt left seated in wc with husband present and needs met.   Session 2 Pt greeted seated in wc and agreeable to OT treatment session. Worked on one-handed strategies and use of L hand as a stabilizer to place candies in zip-lock baggies. Pt brought to therapy gym and complete stand-pivots with CGA. Pt brought into supine and OT taped L UE to dowel rod. Worked on  chest presses with OT guided A to maintain shoulder positioning. Applied 1:1 NMES to CH1wrist extensors.  Ratio 1:1 Rate 35 pps Waveform- Asymmetric Ramp 1.0 Pulse 300 CH1 Intensity- 15  Duration -  16  OT adjusted L AFO and educated on how brace should look as some of the straps were not applied correctly. OT also adjusted shoe buttons. Pt returned to room and was left in care of nursing and husband for toilet transfer.    Therapy Documentation Precautions:  Precautions Precautions: Fall Precaution Comments: left hemiparesis. L inattention Restrictions Weight Bearing Restrictions: No Pain:    Therapy/Group: Individual Therapy  Valma Cava 08/08/2019, 3:23 PM

## 2019-08-08 NOTE — Progress Notes (Addendum)
[place] PHYSICAL MEDICINE & REHABILITATION PROGRESS NOTE   Subjective/Complaints:  Continues to improve with ambulation and balance. Slept well again last night. Urinary frequency less and more continent  ROS: Patient denies fever, rash, sore throat, blurred vision, nausea, vomiting, diarrhea, cough, shortness of breath or chest pain, joint or back pain, headache, or mood change.   Objective:   No results found. No results for input(s): WBC, HGB, HCT, PLT in the last 72 hours. No results for input(s): NA, K, CL, CO2, GLUCOSE, BUN, CREATININE, CALCIUM in the last 72 hours.  Intake/Output Summary (Last 24 hours) at 08/08/2019 0848 Last data filed at 08/08/2019 0720 Gross per 24 hour  Intake 680 ml  Output --  Net 680 ml     Physical Exam: Vital Signs Blood pressure 117/73, pulse 66, temperature 97.9 F (36.6 C), resp. rate 18, height 5\' 5"  (1.651 m), weight 116.5 kg, SpO2 99 %. Constitutional: No distress . Vital signs reviewed. HEENT: EOMI, oral membranes moist Neck: supple Cardiovascular: RRR without murmur. No JVD    Respiratory: CTA Bilaterally without wheezes or rales. Normal effort    GI: BS +, non-tender, non-distended  Skin: intact Neuro: AOx3. Mild Left facial droop. CN 2-12 otherwise intact.  Sensation 1+/2 LUE and LLE--stable 5/5 strength on right side. LUE: 1/5 pec/bicep--tr/5; 0/5 wrist, triceps, hand LLE: 3- to 3/5 HF, KE, ADF stable exam Musc: improved right shoulder ROM A/P Psych: pleasant  Assessment/Plan: 1. Functional deficits secondary to acute ischemic right MCA stroke which require 3+ hours per day of interdisciplinary therapy in a comprehensive inpatient rehab setting.  Physiatrist is providing close team supervision and 24 hour management of active medical problems listed below.  Physiatrist and rehab team continue to assess barriers to discharge/monitor patient progress toward functional and medical goals  Care Tool:  Bathing    Body  parts bathed by patient: Chest, Abdomen, Right upper leg, Left upper leg, Right lower leg, Face   Body parts bathed by helper: Front perineal area, Buttocks, Left lower leg, Left arm, Right lower leg     Bathing assist Assist Level: Moderate Assistance - Patient 50 - 74%     Upper Body Dressing/Undressing Upper body dressing   What is the patient wearing?: Bra, Pull over shirt    Upper body assist Assist Level: Moderate Assistance - Patient 50 - 74%    Lower Body Dressing/Undressing Lower body dressing      What is the patient wearing?: Pants, Incontinence brief     Lower body assist Assist for lower body dressing: Maximal Assistance - Patient 25 - 49%     Toileting Toileting    Toileting assist Assist for toileting: Maximal Assistance - Patient 25 - 49%     Transfers Chair/bed transfer  Transfers assist     Chair/bed transfer assist level: Contact Guard/Touching assist     Locomotion Ambulation   Ambulation assist      Assist level: Minimal Assistance - Patient > 75% Assistive device: Walker-rolling Max distance: 60'   Walk 10 feet activity   Assist     Assist level: Minimal Assistance - Patient > 75% Assistive device: Walker-rolling, Orthosis   Walk 50 feet activity   Assist Walk 50 feet with 2 turns activity did not occur: Safety/medical concerns  Assist level: Minimal Assistance - Patient > 75% Assistive device: Orthosis, Walker-rolling    Walk 150 feet activity   Assist Walk 150 feet activity did not occur: Safety/medical concerns  Walk 10 feet on uneven surface  activity   Assist Walk 10 feet on uneven surfaces activity did not occur: Safety/medical concerns         Wheelchair     Assist Will patient use wheelchair at discharge?: Yes Type of Wheelchair: Manual    Wheelchair assist level: Supervision/Verbal cueing Max wheelchair distance: 100 ft    Wheelchair 50 feet with 2 turns  activity    Assist        Assist Level: Supervision/Verbal cueing   Wheelchair 150 feet activity     Assist  Wheelchair 150 feet activity did not occur: Safety/medical concerns       Blood pressure 117/73, pulse 66, temperature 97.9 F (36.6 C), resp. rate 18, height 5\' 5"  (1.651 m), weight 116.5 kg, SpO2 99 %.    Medical Problem List and Plan: 1.  Impaired mobility and strength secondary to R MCA infarct             -patient may shower             -Continue CIR therapies including PT, OT, and SLP   -left WHO and PRAFO at night  -progressing with gait. AFO has improved gait quality  2.  Antithrombotics: -DVT/anticoagulation:  Pharmaceutical: Lovenox             -antiplatelet therapy: ASA/Brillinta 3. Chronic R>L knee pain/Pain Management: will add Sportscreme prn. May need right knee sleeve for support.              Chronic right shoulder scapular dyskinesis--likely worsened by increased use due to flaccid left extremity. No real benefits with TPI's. Rhomboids most involved  -tizanidine  4mg  QHS only to help limit sedation,  to help with left foot, hand spasms  -continue kpad  -Scapular ROM with therapies   -lidoderm patch    4. Mood: LCSW to follow for evaluation and support.              -antipsychotic agents: N/A             -resumed wellbutrin per home dose  -resumed her adderall per home dose which she uses for impulsivity  -appreciate neuropsych eval  -continue trazodone to help with sleep 5. Neuropsych: This patient is capable of making decisions on her own behalf. 6. Skin/Wound Care: Routine pressure relief measures.  7. Fluids/Electrolytes/Nutrition: Monitor I/O.   -encourage PO   -discussed with pt that we can use multivit, fe+ but not her proprietary mixture  -12/18 recheck labs monday 8. Chronic HA: Takes goody powders/Motrin/Ultram. Continue tylenol prn.   - low dose topamax added--continue for now 9. T2DM: Hgb A1c-8.0.  Monitor diet.   Resumed  home dose metformin and Victoza    12/18. cbg's tightly controlled. Might even be able to back off some medication 11. H/o band failure/gastric sleeve: Continue multivitamin. Appetite has been good.  12.  Sinus pressure: generally better.  Felt to be due to allergies. began Flonase and allegra to help manage symptoms. (explained that these were formulary meds and should be equivalent)  -no allergy shots while in hospital d/t compounded nature 13. Bowels and bladder:  -12/18 slowly improving, less incontinence  -continue timed voids  -PVR scans are low .   -continue trial of myrbetriq 25mg  daily for now  E Coli UTI: macrobid started 12/14--continue for 7 days.    LOS: 17 days A FACE TO [place] 08/08/2019, 8:48 AM

## 2019-08-09 ENCOUNTER — Inpatient Hospital Stay (HOSPITAL_COMMUNITY): Payer: No Typology Code available for payment source | Admitting: Physical Therapy

## 2019-08-09 ENCOUNTER — Inpatient Hospital Stay (HOSPITAL_COMMUNITY): Payer: No Typology Code available for payment source

## 2019-08-09 ENCOUNTER — Inpatient Hospital Stay (HOSPITAL_COMMUNITY): Payer: No Typology Code available for payment source | Admitting: Occupational Therapy

## 2019-08-09 DIAGNOSIS — G8929 Other chronic pain: Secondary | ICD-10-CM

## 2019-08-09 DIAGNOSIS — M25561 Pain in right knee: Secondary | ICD-10-CM

## 2019-08-09 DIAGNOSIS — B9620 Unspecified Escherichia coli [E. coli] as the cause of diseases classified elsewhere: Secondary | ICD-10-CM

## 2019-08-09 DIAGNOSIS — E119 Type 2 diabetes mellitus without complications: Secondary | ICD-10-CM

## 2019-08-09 DIAGNOSIS — E1169 Type 2 diabetes mellitus with other specified complication: Secondary | ICD-10-CM

## 2019-08-09 DIAGNOSIS — D62 Acute posthemorrhagic anemia: Secondary | ICD-10-CM

## 2019-08-09 DIAGNOSIS — I69354 Hemiplegia and hemiparesis following cerebral infarction affecting left non-dominant side: Secondary | ICD-10-CM

## 2019-08-09 DIAGNOSIS — E669 Obesity, unspecified: Secondary | ICD-10-CM

## 2019-08-09 DIAGNOSIS — G8320 Monoplegia of upper limb affecting unspecified side: Secondary | ICD-10-CM

## 2019-08-09 DIAGNOSIS — N390 Urinary tract infection, site not specified: Secondary | ICD-10-CM

## 2019-08-09 LAB — GLUCOSE, CAPILLARY
Glucose-Capillary: 88 mg/dL (ref 70–99)
Glucose-Capillary: 97 mg/dL (ref 70–99)
Glucose-Capillary: 94 mg/dL (ref 70–99)
Glucose-Capillary: 104 mg/dL — ABNORMAL HIGH (ref 70–99)

## 2019-08-09 NOTE — Progress Notes (Signed)
Physical Therapy Session Note  Patient Details  Name: Kathryn Coffey MRN: [id number] Date of Birth: 08/10/1971  Today's Date: 08/09/2019 PT Individual Time: 1300-1400 PT Individual Time Calculation (min): 60 min   Short Term Goals: Week 3:  PT Short Term Goal 1 (Week 3): STG = LTG due to estimated d/c date.  Skilled Therapeutic Interventions/Progress Updates:   Pt received sitting in WC and agreeable to PT. PT reset t-strap on AFO to improve ankle stability. Pt transported to rehab gym. Stand pivot transfers with RW and min assist from PT for safety and cues for increased step width. Dynamic gait training in parallel bars forward and backward 2 x 10ft as well as laterally 2 x 10ft each.cues for appropriate step length and improved L side hip control to prevent excessive fource in heel contact on the R. Dynamic standing balance with LUE support only while engaged in Wii bowling, min assist overall from PT with intermittent cues for improved attention to the LLE and increased terminal knee extension. Able to tolerate 3 frams +4 frames with restbreak in between while standing. Gait training with TRW x 40ft with min assist for safety and cues to prevent GR by improving weight shift and adequate step length. Patient returned to room and left sitting in WC with call bell in reach and all needs met.         Therapy Documentation Precautions:  Precautions Precautions: Fall Precaution Comments: left hemiparesis. L inattention Restrictions Weight Bearing Restrictions: No    Pain:   denies   Therapy/Group: Individual Therapy  Austin E Tucker 08/09/2019, 2:02 PM  

## 2019-08-09 NOTE — Progress Notes (Signed)
Occupational Therapy Session Note  Patient Details  Name: Kathryn Coffey MRN: [id number] Date of Birth: 10/13/1970  Today's Date: 08/09/2019 OT Individual Time: 1536-1630 OT Individual Time Calculation (min): 54 min    Short Term Goals: Week 3:  OT Short Term Goal 1 (Week 3): LTG=STG 2/2 ELOS  Skilled Therapeutic Interventions/Progress Updates:    Pt received sitting up in the w/c with no c/o pain. Pt agreeable to session. Pt taken down to the therapy gym total A for time management.   Pt sat at elevated table and was agreeable to NMES. Activation present in 2nd and 3rd finger, 3rd finger especially. Gentle AAROM provided for full finger flexion.  1:1 NMES applied to anterior forearm to activate finger flexion and wrist flexors.   Ratio 1:3 Rate 35 pps Waveform- Asymmetric Ramp 1.0 Pulse 300 Intensity- 16 Duration -  10 min  Report of pain at the beginning of session: 0/10 Report of pain at the end of session: 0/10  No adverse reactions after treatment and is skin intact.   Pt completed several standing level BUE weightbearing activities with mod facilitation required at the L elbow to maintain elbow extension. Pt then completed seated level facilitated scapular protraction and retraction. Cueing provided for correct muscle activation. Pt was returned to her room via w/c and left sitting up with husband present, all needs met.    Therapy Documentation Precautions:  Precautions Precautions: Fall Precaution Comments: left hemiparesis. L inattention Restrictions Weight Bearing Restrictions: No  Therapy/Group: Individual Therapy  Curtis Sites 08/09/2019, 4:38 PM

## 2019-08-09 NOTE — Progress Notes (Signed)
Pt slept well throughout night without issue 

## 2019-08-09 NOTE — Progress Notes (Signed)
[facility] PHYSICAL MEDICINE & REHABILITATION PROGRESS NOTE   Subjective/Complaints: Patient seen sitting up in her chair this morning.  She states she slept well overnight.  ROS: Denies CP, SOB, N/V/D  Objective:   No results found. No results for input(s): WBC, HGB, HCT, PLT in the last 72 hours. No results for input(s): NA, K, CL, CO2, GLUCOSE, BUN, CREATININE, CALCIUM in the last 72 hours.  Intake/Output Summary (Last 24 hours) at 08/09/2019 1405 Last data filed at 08/09/2019 0830 Gross per 24 hour  Intake 480 ml  Output --  Net 480 ml     Physical Exam: Vital Signs Blood pressure 116/74, pulse 78, temperature 97.8 F (36.6 C), temperature source Oral, resp. rate 20, height 5\' 5"  (1.651 m), weight 116.5 kg, SpO2 98 %. Constitutional: No distress . Vital signs reviewed. HENT: Normocephalic.  Atraumatic. Eyes: EOMI. No discharge. Cardiovascular: No JVD. Respiratory: Normal effort.  No stridor. GI: Non-distended. Skin: Warm and dry.  Intact. Psych: Normal mood.  Normal behavior. Musc: No edema in extremities.  No tenderness in extremities. Neuro: Alert Left facial droop Motor: RUE/RLE: 5/5 proximal distal LUE: 0/5 proximal distal LLE: 4 -/5 proximal to distal  No increase in tone appreciated  Assessment/Plan: 1. Functional deficits secondary to acute ischemic right MCA stroke which require 3+ hours per day of interdisciplinary therapy in a comprehensive inpatient rehab setting.  Physiatrist is providing close team supervision and 24 hour management of active medical problems listed below.  Physiatrist and rehab team continue to assess barriers to discharge/monitor patient progress toward functional and medical goals  Care Tool:  Bathing    Body parts bathed by patient: Chest, Abdomen, Right upper leg, Left upper leg, Right lower leg, Face   Body parts bathed by helper: Front perineal area, Buttocks, Left lower leg, Left arm, Right lower leg     Bathing  assist Assist Level: Moderate Assistance - Patient 50 - 74%     Upper Body Dressing/Undressing Upper body dressing   What is the patient wearing?: Bra, Pull over shirt    Upper body assist Assist Level: Moderate Assistance - Patient 50 - 74%    Lower Body Dressing/Undressing Lower body dressing      What is the patient wearing?: Pants, Incontinence brief     Lower body assist Assist for lower body dressing: Maximal Assistance - Patient 25 - 49%     Toileting Toileting    Toileting assist Assist for toileting: Maximal Assistance - Patient 25 - 49%     Transfers Chair/bed transfer  Transfers assist     Chair/bed transfer assist level: Contact Guard/Touching assist     Locomotion Ambulation   Ambulation assist      Assist level: Minimal Assistance - Patient > 75% Assistive device: Walker-rolling Max distance: 60'   Walk 10 feet activity   Assist     Assist level: Minimal Assistance - Patient > 75% Assistive device: Walker-rolling, Orthosis   Walk 50 feet activity   Assist Walk 50 feet with 2 turns activity did not occur: Safety/medical concerns  Assist level: Minimal Assistance - Patient > 75% Assistive device: Orthosis, Walker-rolling    Walk 150 feet activity   Assist Walk 150 feet activity did not occur: Safety/medical concerns         Walk 10 feet on uneven surface  activity   Assist Walk 10 feet on uneven surfaces activity did not occur: Safety/medical concerns         Wheelchair  Assist Will patient use wheelchair at discharge?: Yes Type of Wheelchair: Manual    Wheelchair assist level: Supervision/Verbal cueing Max wheelchair distance: 100 ft    Wheelchair 50 feet with 2 turns activity    Assist        Assist Level: Supervision/Verbal cueing   Wheelchair 150 feet activity     Assist  Wheelchair 150 feet activity did not occur: Safety/medical concerns       Blood pressure 116/74, pulse 78,  temperature 97.8 F (36.6 C), temperature source Oral, resp. rate 20, height 5\' 5"  (1.651 m), weight 116.5 kg, SpO2 98 %.    Medical Problem List and Plan: 1.    Left hemiparesis with flaccid monoplegia, impaired mobility and strength secondary to R MCA infarct            Continue CIR  -left WHO and PRAFO at night 2.  Antithrombotics: -DVT/anticoagulation:  Pharmaceutical: Lovenox             -antiplatelet therapy: ASA/Brillinta 3. Chronic R>L knee pain/Pain Management: Added Sportscreme prn. May need right knee sleeve for support.              Chronic right shoulder scapular dyskinesis--likely worsened by increased use due to flaccid left extremity. No real benefits with TPI's. Rhomboids most involved  -tizanidine  4mg  QHS only to help limit sedation,  to help with left foot, hand spasms  -continue kpad  -Scapular ROM with therapies   -lidoderm patch  Controlled on 12/19 with medications 4. Mood: LCSW to follow for evaluation and support.              -antipsychotic agents: N/A             -resumed wellbutrin per home dose  -resumed her adderall per home dose which she uses for impulsivity  -appreciate neuropsych eval  -continue trazodone to help with sleep 5. Neuropsych: This patient is capable of making decisions on her own behalf. 6. Skin/Wound Care: Routine pressure relief measures.  7. Fluids/Electrolytes/Nutrition: Monitor I/O.   -encourage PO   -discussed with pt that we can use multivit, fe+ but not her proprietary mixture  Labs ordered for Monday 8. Chronic HA: Takes goody powders/Motrin/Ultram. Continue tylenol prn.   - low dose topamax added--continue for now  Appears controlled on 12/19 9. T2DM: Hgb A1c-8.0.  Monitor diet.   Resumed home dose metformin and Victoza    Controlled on 12/19 11. H/o band failure/gastric sleeve: Continue multivitamin. Appetite has been good.   Morbid obesity-encouraged weight loss 12.  Sinus pressure: generally better.  Felt to be due to  allergies. Flonase and allegra to help manage symptoms 13. Bowels and bladder:  -continue trial of myrbetriq 25mg  daily for now  See #14 14.  E Coli UTI: Macrobid started 12/14--continue for 7 days.   WBCs 11.4 on 12/14, labs ordered for Monday  Afebrile 15.  Acute blood loss anemia  Hemoglobin 9.7 on 12/14  Labs ordered for Monday   LOS: 18 days A FACE TO FACE EVALUATION WAS PERFORMED  Mattew Chriswell Lorie Phenix 08/09/2019, 2:05 PM

## 2019-08-10 ENCOUNTER — Inpatient Hospital Stay (HOSPITAL_COMMUNITY): Payer: No Typology Code available for payment source | Admitting: Occupational Therapy

## 2019-08-10 ENCOUNTER — Inpatient Hospital Stay (HOSPITAL_COMMUNITY): Payer: No Typology Code available for payment source | Admitting: Physical Therapy

## 2019-08-10 LAB — GLUCOSE, CAPILLARY
Glucose-Capillary: 102 mg/dL — ABNORMAL HIGH (ref 70–99)
Glucose-Capillary: 72 mg/dL (ref 70–99)
Glucose-Capillary: 101 mg/dL — ABNORMAL HIGH (ref 70–99)
Glucose-Capillary: 117 mg/dL — ABNORMAL HIGH (ref 70–99)

## 2019-08-10 IMAGING — MR MR KNEE*R* W/O CM
6 series · 40 of 40 positions shown · non-contrast
Comparison: None.

CLINICAL DATA: Hyperextension injury of the right knee 2 years ago
with pop. Chronic right knee pain with instability.

EXAM:
MRI OF THE RIGHT KNEE WITHOUT CONTRAST
TECHNIQUE: Multiplanar, multisequence MR imaging of the knee was performed. No
intravenous contrast was administered.

[Series 28: T2 fat-sat · axial · right · 4.0mm · 0.50mm/px · z∈[-97,+25]mm · 7 of 26 slices shown (1 of 3)]
[im 1/26]
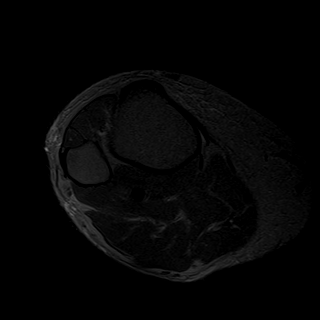
[im 5/26]
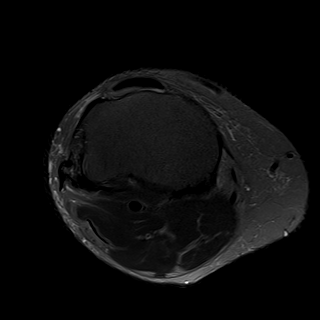
[im 9/26]
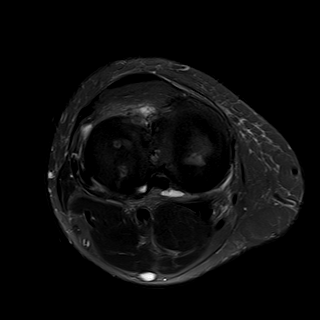
[im 13/26]
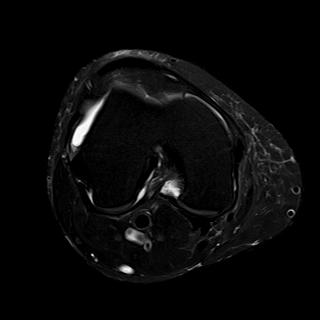
[im 17/26]
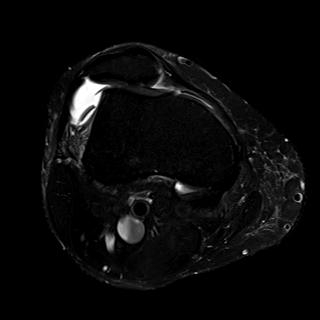
[im 21/26]
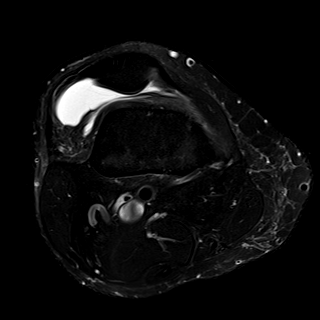
[im 26/26]
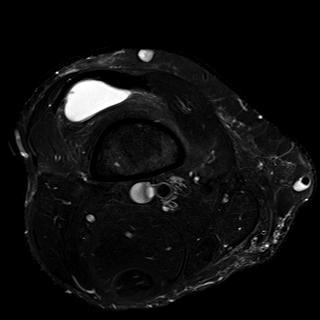

[Series 29: T1 · coronal · right · 4.0mm · 0.59mm/px · 6 of 26 slices shown]
[im 1/26]
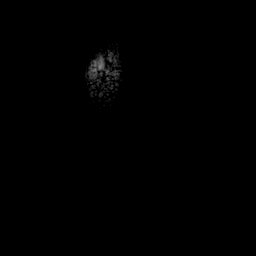
[im 6/26]
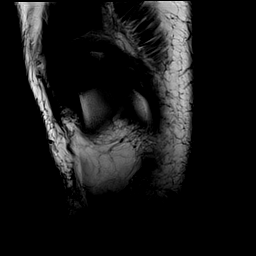
[im 11/26]
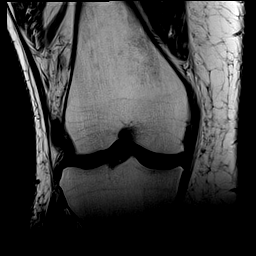
[im 16/26]
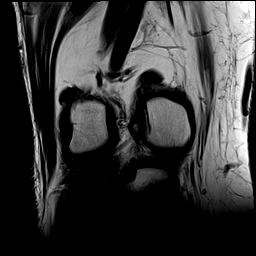
[im 21/26]
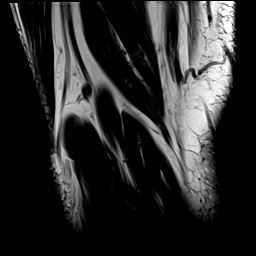
[im 26/26]
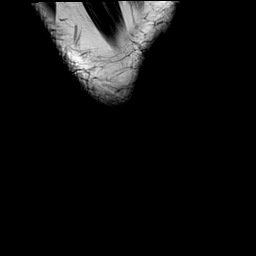

[Series 30: PD fat-sat · coronal · right · 4.0mm · 0.59mm/px · 6 of 26 slices shown (1 of 2)]
[im 1/26]
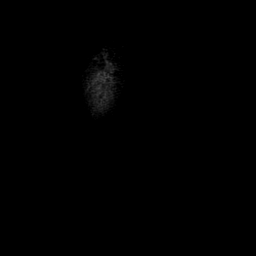
[im 6/26]
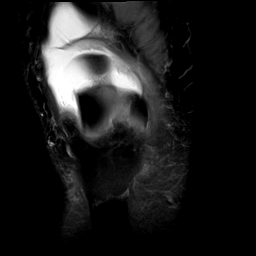
[im 11/26]
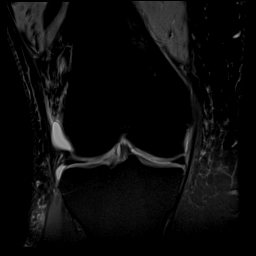
[im 16/26]
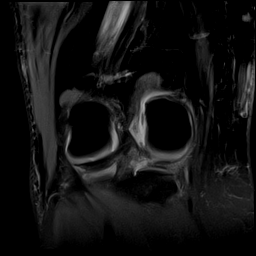
[im 21/26]
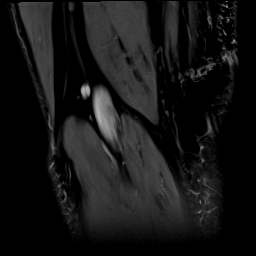
[im 26/26]
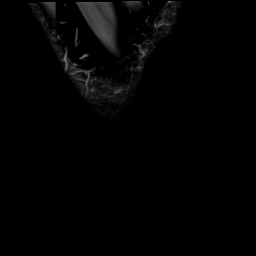

[Series 31: PD fat-sat · sagittal · right · 3.0mm · 0.59mm/px · 7 of 32 slices shown (2 of 2)]
[im 1/32]
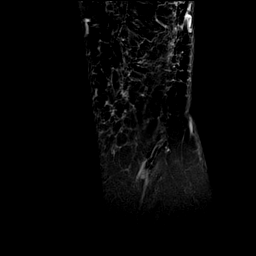
[im 6/32]
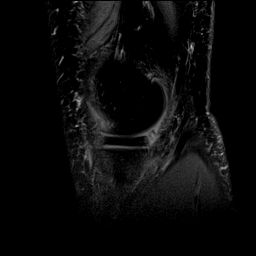
[im 11/32]
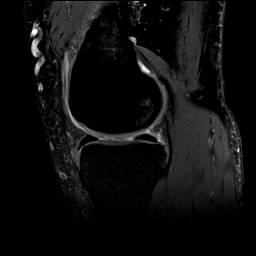
[im 16/32]
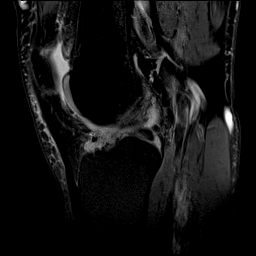
[im 21/32]
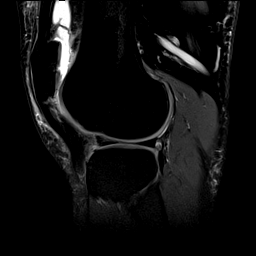
[im 26/32]
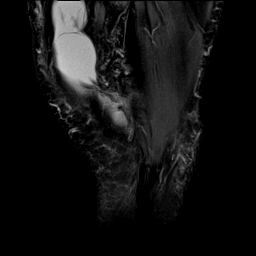
[im 32/32]
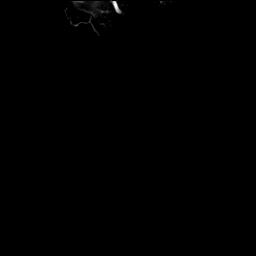

[Series 32: T2 fat-sat · sagittal · right · 3.0mm · 0.59mm/px · 8 of 36 slices shown (2 of 3)]
[im 1/36]
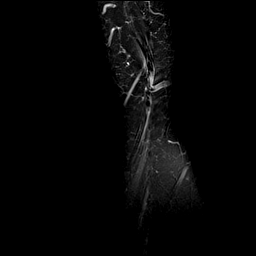
[im 6/36]
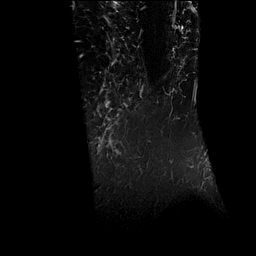
[im 11/36]
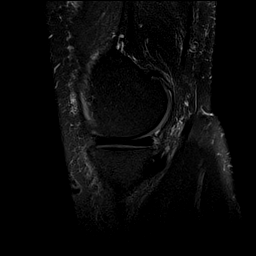
[im 16/36]
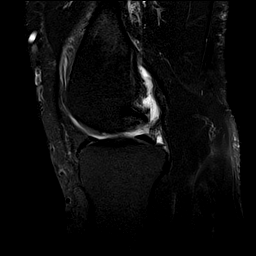
[im 21/36]
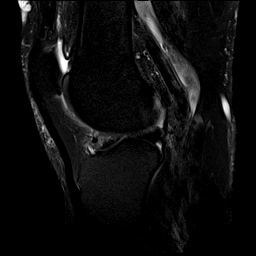
[im 26/36]
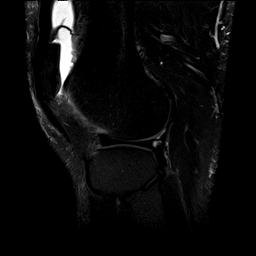
[im 31/36]
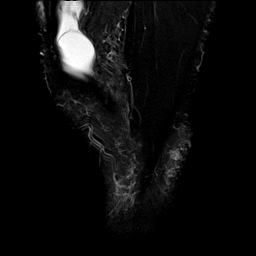
[im 36/36]
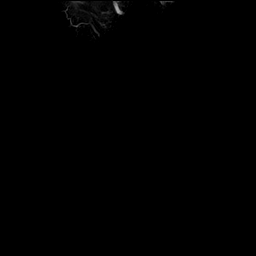

[Series 40: T2 fat-sat · coronal · right · 4.0mm · 0.59mm/px · 6 of 25 slices shown (3 of 3)]
[im 1/25]
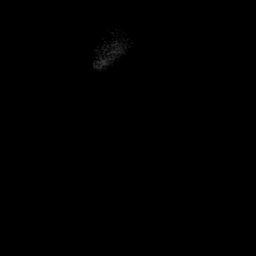
[im 5/25]
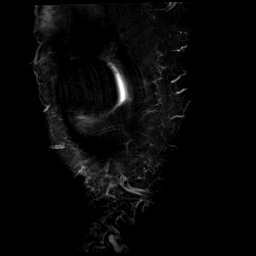
[im 10/25]
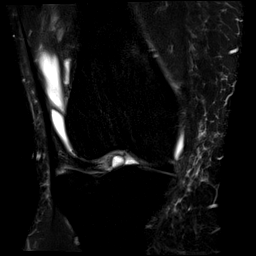
[im 15/25]
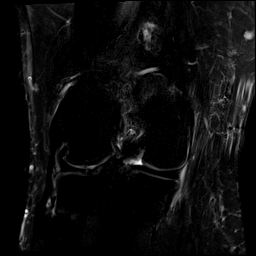
[im 20/25]
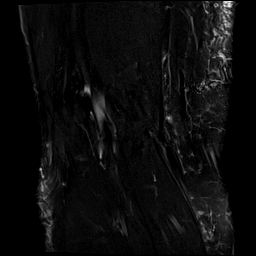
[im 25/25]
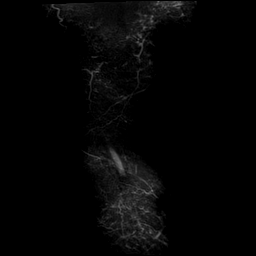

[40 of 40 positions shown; findings below may reference images not displayed]

FINDINGS: MENISCI

Medial meniscus: Free edge fraying/irregularity in the posterior
horn adjacent to the midbody.

Lateral meniscus: Mild degenerative tearing of the anterior horn
along the superior surface and extending into the anterior root.

LIGAMENTS

Cruciates:  Unremarkable

Collaterals:  Unremarkable

CARTILAGE

Patellofemoral: Partial thickness chondral irregularity and chondral
fissuring especially along the lateral patellar facet and extending
into the posterior patellar ridge. Chondral fissuring and
irregularity is also present along the femoral trochlear groove.

Medial: 1.0 by 0.7 cm chondral defect centrally along the medial
femoral condyle, image 13/40. Mild marginal spurring.

Lateral: 0.5 by 1.5 cm partial-thickness chondral defect centrally
along the posterior portion of the lateral femoral condyle with mild
marginal spurring.

Joint: Moderate to large knee joint effusion with superior plica and
mildly thickened medial plica.

On image [DATE], a 1.2 by 0.3 by 1.2 cm loose chondral fragment is
present laterally in the joint.

Popliteal Fossa:  Unremarkable

Extensor Mechanism: The patella is mildly laterally subluxed. Tibial
tubercle-trochlear groove distance 0.6 cm. Borderline thickening of
the distal iliotibial band but without surrounding edema.

Bones:  Spurring of the tibial spine.

Other: No supplemental non-categorized findings.
IMPRESSION: 1. Chondral irregularity chondral defects all 3 compartments, with a
1.2 by 0.3 by 1.2 cm loose chondral fragment laterally in the joint.
2. Mild degenerative tearing of the anterior horn lateral meniscus
along the superior surface extending into the meniscal root.
3. Free edge fraying/irregularity along the posterior horn medial
meniscus adjacent to the midbody.
4. Moderate to large knee joint effusion with superior plica and
mildly thickened medial plica.
5. Mild lateral subluxation of the patella.

## 2019-08-10 NOTE — Progress Notes (Signed)
Kathryn Coffey PHYSICAL MEDICINE & REHABILITATION PROGRESS NOTE   Subjective/Complaints: Patient seen sitting up in bed this morning.  Kathryn Coffey states Kathryn Coffey slept fairly overnight.  Left arm is "still on vacation".  ROS: Denies CP, SOB, N/V/D  Objective:   No results found. No results for input(s): WBC, HGB, HCT, PLT in the last 72 hours. No results for input(s): NA, K, CL, CO2, GLUCOSE, BUN, CREATININE, CALCIUM in the last 72 hours.  Intake/Output Summary (Last 24 hours) at 08/10/2019 0842 Last data filed at 08/09/2019 1700 Gross per 24 hour  Intake 480 ml  Output --  Net 480 ml     Physical Exam: Vital Signs Blood pressure 121/70, pulse 77, temperature 98.2 F (36.8 C), resp. rate 19, height 5\' 5"  (1.651 m), weight 116.5 kg, SpO2 99 %. Constitutional: No distress . Vital signs reviewed. HENT: Normocephalic.  Atraumatic. Eyes: EOMI. No discharge. Cardiovascular: No JVD. Respiratory: Normal effort.  No stridor. GI: Non-distended. Skin: Warm and dry.  Intact. Psych: Normal mood.  Normal behavior. Musc: No edema in extremities.  No tenderness in extremities. Neuro: Alert Left facial droop Motor: RUE/RLE: 5/5 proximal distal LUE: 0/5 proximal distal, unchanged LLE: 4--4/5 proximal to distal  No increase in tone appreciated  Assessment/Plan: 1. Functional deficits secondary to acute ischemic right MCA stroke which require 3+ hours per day of interdisciplinary therapy in a comprehensive inpatient rehab setting.  Physiatrist is providing close team supervision and 24 hour management of active medical problems listed below.  Physiatrist and rehab team continue to assess barriers to discharge/monitor patient progress toward functional and medical goals  Care Tool:  Bathing    Body parts bathed by patient: Chest, Abdomen, Right upper leg, Left upper leg, Right lower leg, Face   Body parts bathed by helper: Front perineal area, Buttocks, Left lower leg, Left arm, Right lower leg      Bathing assist Assist Level: Moderate Assistance - Patient 50 - 74%     Upper Body Dressing/Undressing Upper body dressing   What is the patient wearing?: Bra, Pull over shirt    Upper body assist Assist Level: Moderate Assistance - Patient 50 - 74%    Lower Body Dressing/Undressing Lower body dressing      What is the patient wearing?: Pants, Incontinence brief     Lower body assist Assist for lower body dressing: Maximal Assistance - Patient 25 - 49%     Toileting Toileting    Toileting assist Assist for toileting: Maximal Assistance - Patient 25 - 49%     Transfers Chair/bed transfer  Transfers assist     Chair/bed transfer assist level: Contact Guard/Touching assist     Locomotion Ambulation   Ambulation assist      Assist level: Minimal Assistance - Patient > 75% Assistive device: Walker-rolling Max distance: 60'   Walk 10 feet activity   Assist     Assist level: Minimal Assistance - Patient > 75% Assistive device: Walker-rolling, Orthosis   Walk 50 feet activity   Assist Walk 50 feet with 2 turns activity did not occur: Safety/medical concerns  Assist level: Minimal Assistance - Patient > 75% Assistive device: Orthosis, Walker-rolling    Walk 150 feet activity   Assist Walk 150 feet activity did not occur: Safety/medical concerns         Walk 10 feet on uneven surface  activity   Assist Walk 10 feet on uneven surfaces activity did not occur: Safety/medical concerns         Wheelchair  Assist Will patient use wheelchair at discharge?: Yes Type of Wheelchair: Manual    Wheelchair assist level: Supervision/Verbal cueing Max wheelchair distance: 100 ft    Wheelchair 50 feet with 2 turns activity    Assist        Assist Level: Supervision/Verbal cueing   Wheelchair 150 feet activity     Assist  Wheelchair 150 feet activity did not occur: Safety/medical concerns       Blood pressure 121/70,  pulse 77, temperature 98.2 F (36.8 C), resp. rate 19, height 5\' 5"  ([id number] m), weight 116.5 kg, SpO2 99 %.    Medical Problem List and Plan: 1.    Left hemiparesis with flaccid monoplegia, impaired mobility and strength secondary to R MCA infarct            Continue CIR  -left WHO and PRAFO at night 2.  Antithrombotics: -DVT/anticoagulation:  Pharmaceutical: Lovenox             -antiplatelet therapy: ASA/Brillinta 3. Chronic R>L knee pain/Pain Management: Added Sportscreme prn. May need right knee sleeve for support.              Chronic right shoulder scapular dyskinesis--likely worsened by increased use due to flaccid left extremity. No real benefits with TPI's. Rhomboids most involved  -tizanidine  4mg  QHS only to help limit sedation,  to help with left foot, hand spasms  -continue kpad  -Scapular ROM with therapies   -lidoderm patch  Controlled on 12/20 with medications 4. Mood: LCSW to follow for evaluation and support.              -antipsychotic agents: N/A             -resumed wellbutrin per home dose  -resumed her adderall per home dose which Kathryn Coffey uses for impulsivity  -appreciate neuropsych eval  -continue trazodone to help with sleep 5. Neuropsych: This patient is capable of making decisions on her own behalf. 6. Skin/Wound Care: Routine pressure relief measures.  7. Fluids/Electrolytes/Nutrition: Monitor I/O.   -encourage PO   -discussed with pt that we can use multivit, fe+ but not her proprietary mixture  Labs ordered for tomorrow 8. Chronic HA: Takes goody powders/Motrin/Ultram. Continue tylenol prn.   Low dose topamax added--continue for now  Controlled on 12/20 9. T2DM: Hgb A1c-8.0.  Monitor diet.   Resumed home dose metformin and Victoza    Controlled on 12/20 11. H/o band failure/gastric sleeve: Continue multivitamin. Appetite has been good.   Morbid obesity-encourage weight loss 12.  Sinus pressure: generally better.  Felt to be due to allergies. Flonase and  allegra to help manage symptoms 13. Bowels and bladder:  -continue trial of myrbetriq 25mg  daily for now  See #14 14.  E Coli UTI: Macrobid started 12/14--continue for 7 days.   WBCs 11.4 on 12/14, labs ordered for tomorrow  Afebrile 15.  Acute blood loss anemia  Hemoglobin 9.7 on 12/14, labs ordered for tomorrow  LOS: 19 days A FACE TO [place] 08/10/2019, 8:42 AM

## 2019-08-10 NOTE — Progress Notes (Signed)
Pt slept well throughout night without issue 

## 2019-08-10 NOTE — Progress Notes (Signed)
Physical Therapy Session Note  Patient Details  Name: Kathryn Coffey MRN: [id number] Date of Birth: 03/30/1971  Today's Date: 08/10/2019 PT Individual Time: 1003-1105 PT Individual Time Calculation (min): 62 min   Short Term Goals: Week 3:  PT Short Term Goal 1 (Week 3): STG = LTG due to estimated d/c date.  Skilled Therapeutic Interventions/Progress Updates: Pt presented in w/c agreeable to therapy. Pt transported to rehab gym for energy conservation. Performed Stand pivot transfer to mat CGA with good management of RW. Participated in toe taps to target to 2 in step with emphasis on controlled movement and placement of LLE. The performed alternating toe taps with more emphasis on controlled movement vs momentum and attempting to maintain L foot neutral. Pt then transferred to supine on mat and participated in LLE strengthening as follows: SAQ, 2 x 10, bridges x 10, SL bridge for LLE recruitment x 10. Sidelying clamshells 2 x 10, AA hip abd 2 x 10, sidelying hip extension with knee flexion 2 x 10. Pt was able to roll to L/R with supervision and required min/modA sidelying to sitting on L. Participated in ambulation 50ft with CGA with verbal cues for L hip alignment to improve L foot. Once completed pt transferred back to w/c in same manner as prior and transported back to room. Pt remained in w/c at end of session with half lap tray in place, call bell within reach and needs met.      Therapy Documentation Precautions:  Precautions Precautions: Fall Precaution Comments: left hemiparesis. L inattention Restrictions Weight Bearing Restrictions: No General:   Vital Signs: Therapy Vitals Temp: 97.7 F (36.5 C) Pulse Rate: 77 Resp: 16 BP: 118/71 Patient Position (if appropriate): Lying Oxygen Therapy SpO2: 99 % O2 Device: Room Air    Therapy/Group: Individual Therapy  Rosita DeChalus 08/10/2019, 4:09 PM  

## 2019-08-10 NOTE — Progress Notes (Signed)
Occupational Therapy Session Note  Patient Details  Name: Kathryn Coffey MRN: [id number] Date of Birth: Sep 10, 1970  Today's Date: 08/10/2019 OT Individual Time: 0815-0920 OT Individual Time Calculation (min): 65 min    Short Term Goals: Week 3:  OT Short Term Goal 1 (Week 3): LTG=STG 2/2 ELOS  Skilled Therapeutic Interventions/Progress Updates:    Pt seen for OT session focusing on L UE neuro re-ed and ADL re-training. Pt awake in supine upon arrival, agreeable to tx session and denying pain. She reports having just completed toileting task with nursing and declined attempt to void.  She transferred to sitting EOB with supervision using hospital bed functions. Don B socks and shoes seated EOB with assist for L shoe/AFO.  Short distance ambulation to w/c with CGA using RW. Self propelled w/c ~20ft using B LEs before fatiguing and requiring assist for remainder of way to therapy gym.  Completed dynamic standing task at high/low table with facilitation provided for WBing through L UE/LE while pt reached across midline to place clothes pins on target. CGA overall for balance. Completed x2 trials with seated rest break required btwn trials.  Complete e-stim on L UE per pt request.   1:1 NMES applied to dorsal aspect of forearm for wrist and finger extension. Attempted trial with pads placed on anterior aspect for wrist and finger flexion, however with limited success.    Ratio 1:3 Rate 35 pps Waveform- Asymmetric Ramp 1.0 Pulse 300 Intensity- 28 Duration -  10 min  Report of pain at the beginning of session: 0/10 Report of pain at the end of session: 0/10  No adverse reactions after treatment and is skin intact.   Pt returned to room and following set-up of all supplies voiced need for urgent toileting task. Min A stand pivot to toilet, pt incontinent of small amount of urine and then continent void into toilet. Total A for time management to change brief and pants. Pt completed  toileting task with steadying assist. She returned to w/c at end of session and left seated with all needs in reach and L lap tray applied.     Therapy Documentation Precautions:  Precautions Precautions: Fall Precaution Comments: left hemiparesis. L inattention Restrictions Weight Bearing Restrictions: No   Therapy/Group: Individual Therapy  Lavoris Sparling L 08/10/2019, 6:36 AM

## 2019-08-11 ENCOUNTER — Ambulatory Visit (HOSPITAL_COMMUNITY): Payer: No Typology Code available for payment source | Admitting: Physical Therapy

## 2019-08-11 ENCOUNTER — Encounter (HOSPITAL_COMMUNITY): Payer: No Typology Code available for payment source

## 2019-08-11 LAB — GLUCOSE, CAPILLARY
Glucose-Capillary: 101 mg/dL — ABNORMAL HIGH (ref 70–99)
Glucose-Capillary: 65 mg/dL — ABNORMAL LOW (ref 70–99)
Glucose-Capillary: 94 mg/dL (ref 70–99)
Glucose-Capillary: 92 mg/dL (ref 70–99)
Glucose-Capillary: 115 mg/dL — ABNORMAL HIGH (ref 70–99)

## 2019-08-11 LAB — BASIC METABOLIC PANEL
Anion gap: 10 (ref 5–15)
BUN: 15 mg/dL (ref 6–20)
CO2: 23 mmol/L (ref 22–32)
Calcium: 9.1 mg/dL (ref 8.9–10.3)
Chloride: 106 mmol/L (ref 98–111)
Creatinine, Ser: 0.63 mg/dL (ref 0.44–1.00)
GFR calc Af Amer: >60 mL/min (ref >60)
GFR calc non Af Amer: >60 mL/min (ref >60)
Glucose, Bld: 115 mg/dL — ABNORMAL HIGH (ref 70–99)
Potassium: 3.6 mmol/L (ref 3.5–5.1)
Sodium: 139 mmol/L (ref 135–145)

## 2019-08-11 LAB — CBC
HCT: 31.9 % — ABNORMAL LOW (ref 36.0–46.0)
Hemoglobin: 9.5 g/dL — ABNORMAL LOW (ref 12.0–15.0)
MCH: 23.3 pg — ABNORMAL LOW (ref 26.0–34.0)
MCHC: 29.8 g/dL — ABNORMAL LOW (ref 30.0–36.0)
MCV: 78.4 fL — ABNORMAL LOW (ref 80.0–100.0)
Platelets: 422 10*3/uL — ABNORMAL HIGH (ref 150–400)
RBC: 4.07 MIL/uL (ref 3.87–5.11)
RDW: 17.3 % — ABNORMAL HIGH (ref 11.5–15.5)
WBC: 7.3 10*3/uL (ref 4.0–10.5)
nRBC: 0 % (ref 0.0–0.2)

## 2019-08-11 MED ORDER — BUPROPION HCL 100 MG PO TABS
100.0000 mg | ORAL_TABLET | Freq: Two times a day (BID) | ORAL | Status: DC
  Administered 2019-08-11 – 2019-08-13 (×4): 100 mg via ORAL
  Filled 2019-08-11 (×4): qty 1

## 2019-08-11 NOTE — Plan of Care (Signed)
  Problem: Consults Goal: RH STROKE PATIENT EDUCATION Description: See Patient Education module for education specifics  Outcome: Progressing   Problem: RH BOWEL ELIMINATION Goal: RH STG MANAGE BOWEL W/MEDICATION W/ASSISTANCE Description: STG Manage Bowel with Medication with mod I Assistance. Outcome: Progressing   Problem: RH SKIN INTEGRITY Goal: RH STG MAINTAIN SKIN INTEGRITY WITH ASSISTANCE Description: STG Maintain Skin Integrity With min Assistance. Outcome: Progressing   Problem: RH SAFETY Goal: RH STG ADHERE TO SAFETY PRECAUTIONS W/ASSISTANCE/DEVICE Description: STG Adhere to Safety Precautions With Mod I Assistance/Device. Outcome: Progressing Goal: RH STG DECREASED RISK OF FALL WITH ASSISTANCE Description: STG Decreased Risk of Fall With Mod I Assistance. Outcome: Progressing   Problem: RH PAIN MANAGEMENT Goal: RH STG PAIN MANAGED AT OR BELOW PT'S PAIN GOAL Description: Less than 3 on 0-10 scale Outcome: Progressing

## 2019-08-11 NOTE — Progress Notes (Signed)
Occupational Therapy Session Note  Patient Details  Name: Kathryn Coffey MRN: [id number] Date of Birth: 29-May-1971  Today's Date: 08/11/2019 OT Individual Time: 0925-1020 OT Individual Time Calculation (min): 55 min    Short Term Goals: Week 3:  OT Short Term Goal 1 (Week 3): LTG=STG 2/2 ELOS  Skilled Therapeutic Interventions/Progress Updates:    Pt received sitting up in the w /c with husband Kathryn Coffey present for family education. With Kathryn Coffey guarding, pt completed 10 ft of functional mobility into the bathroom with the RW to transfer into walk in shower. Edu/cueing provided for safety, body mechanics, and techniques to increase ease with ADL transfers at home. Discussed home set up and walk in shower techniques vs tub transfers with a TTB. Pt completed shower from sit > stand with use of anterior grab bars with close (S). Pt required initiation cues for bathing LB. Pt able to use AE (LH sponge and wash mit) to wash self thoroughly without physical assist. Pt completed transfer out of shower with assist of Kathryn Coffey and to w/c. Pt's husband assisted with dressing from w/c level. Pt returned to bed and was left sitting up with all needs met, husband present.   Therapy Documentation Precautions:  Precautions Precautions: Fall Precaution Comments: left hemiparesis. L inattention Restrictions Weight Bearing Restrictions: No   Therapy/Group: Individual Therapy  Curtis Sites 08/11/2019, 6:49 AM

## 2019-08-11 NOTE — Progress Notes (Signed)
Pt slept well thorughout night, incont x1. Up in w/c performing adls

## 2019-08-11 NOTE — Progress Notes (Signed)
[facility] PHYSICAL MEDICINE & REHABILITATION PROGRESS NOTE   Subjective/Complaints: Patient seen sitting up in bed this WC this morning. She states she slept fairly overnight.  Denies pain, constipation. She feels anxious-excited about going home this week.  Labs stable this morning.   ROS: Denies CP, SOB, N/V/D  Objective:   No results found. Recent Labs    08/11/19 0600  WBC 7.3  HGB 9.5*  HCT 31.9*  PLT 422*   Recent Labs    08/11/19 0600  NA 139  K 3.6  CL 106  CO2 23  GLUCOSE 115*  BUN 15  CREATININE 0.63  CALCIUM 9.1    Intake/Output Summary (Last 24 hours) at 08/11/2019 0905 Last data filed at 08/11/2019 0720 Gross per 24 hour  Intake 480 ml  Output --  Net 480 ml     Physical Exam: Vital Signs Blood pressure 119/79, pulse 68, temperature 98.3 F (36.8 C), resp. rate 18, height 5\' 5"  (1.651 m), weight 116.5 kg, SpO2 100 %. Constitutional: No distress . Vital signs reviewed. HENT: Normocephalic.  Atraumatic. Eyes: EOMI. No discharge. Cardiovascular: No JVD. Respiratory: Normal effort.  No stridor. GI: Non-distended. Skin: Warm and dry.  Intact. Psych: Normal mood.  Normal behavior. Musc: No edema in extremities.  No tenderness in extremities. Neuro: Alert Left facial droop Motor: RUE/RLE: 5/5 proximal distal LUE: 0/5 proximal distal, unchanged LLE: 4--4/5 proximal to distal  No increase in tone appreciated  Assessment/Plan: 1. Functional deficits secondary to acute ischemic right MCA stroke which require 3+ hours per day of interdisciplinary therapy in a comprehensive inpatient rehab setting.  Physiatrist is providing close team supervision and 24 hour management of active medical problems listed below.  Physiatrist and rehab team continue to assess barriers to discharge/monitor patient progress toward functional and medical goals  Care Tool:  Bathing    Body parts bathed by patient: Chest, Abdomen, Right upper leg, Left upper leg, Right  lower leg, Face   Body parts bathed by helper: Front perineal area, Buttocks, Left lower leg, Left arm, Right lower leg     Bathing assist Assist Level: Moderate Assistance - Patient 50 - 74%     Upper Body Dressing/Undressing Upper body dressing   What is the patient wearing?: Bra, Pull over shirt    Upper body assist Assist Level: Moderate Assistance - Patient 50 - 74%    Lower Body Dressing/Undressing Lower body dressing      What is the patient wearing?: Pants, Incontinence brief     Lower body assist Assist for lower body dressing: Maximal Assistance - Patient 25 - 49%     Toileting Toileting    Toileting assist Assist for toileting: Minimal Assistance - Patient > 75%     Transfers Chair/bed transfer  Transfers assist     Chair/bed transfer assist level: Contact Guard/Touching assist     Locomotion Ambulation   Ambulation assist      Assist level: Minimal Assistance - Patient > 75% Assistive device: Walker-rolling Max distance: 60'   Walk 10 feet activity   Assist     Assist level: Minimal Assistance - Patient > 75% Assistive device: Walker-rolling, Orthosis   Walk 50 feet activity   Assist Walk 50 feet with 2 turns activity did not occur: Safety/medical concerns  Assist level: Minimal Assistance - Patient > 75% Assistive device: Orthosis, Walker-rolling    Walk 150 feet activity   Assist Walk 150 feet activity did not occur: Safety/medical concerns  Walk 10 feet on uneven surface  activity   Assist Walk 10 feet on uneven surfaces activity did not occur: Safety/medical concerns         Wheelchair     Assist Will patient use wheelchair at discharge?: Yes Type of Wheelchair: Manual    Wheelchair assist level: Supervision/Verbal cueing Max wheelchair distance: 100 ft    Wheelchair 50 feet with 2 turns activity    Assist        Assist Level: Supervision/Verbal cueing   Wheelchair 150 feet activity      Assist  Wheelchair 150 feet activity did not occur: Safety/medical concerns       Blood pressure 119/79, pulse 68, temperature 98.3 F (36.8 C), resp. rate 18, height 5\' 5"  (1.651 m), weight 116.5 kg, SpO2 100 %.    Medical Problem List and Plan: 1.    Left hemiparesis with flaccid monoplegia, impaired mobility and strength secondary to R MCA infarct            Continue CIR  -left WHO and PRAFO at night 2.  Antithrombotics: -DVT/anticoagulation:  Pharmaceutical: Lovenox             -antiplatelet therapy: ASA/Brillinta 3. Chronic R>L knee pain/Pain Management: Added Sportscreme prn. May need right knee sleeve for support.              Chronic right shoulder scapular dyskinesis--likely worsened by increased use due to flaccid left extremity. No real benefits with TPI's. Rhomboids most involved  -tizanidine  4mg  QHS only to help limit sedation,  to help with left foot, hand spasms  -continue kpad  -Scapular ROM with therapies   -lidoderm patch  Controlled on 12/20 with medications 4. Mood: LCSW to follow for evaluation and support.              -antipsychotic agents: N/A             -resumed wellbutrin per home dose  -resumed her adderall per home dose which she uses for impulsivity  -appreciate neuropsych eval  -continue trazodone to help with sleep 5. Neuropsych: This patient is capable of making decisions on her own behalf. 6. Skin/Wound Care: Routine pressure relief measures.  7. Fluids/Electrolytes/Nutrition: Monitor I/O.   -encourage PO   -discussed with pt that we can use multivit, fe+ but not her proprietary mixture  Labs stable this morning.  8. Chronic HA: Takes goody powders/Motrin/Ultram. Continue tylenol prn.   Low dose topamax added--continue for now  Controlled on 12/20-12/21 9. T2DM: Hgb A1c-8.0.  Monitor diet.   Resumed home dose metformin and Victoza    Controlled on 12/20-12/21 11. H/o band failure/gastric sleeve: Continue multivitamin. Appetite has  been good.   Morbid obesity-encourage weight loss 12.  Sinus pressure: generally better.  Felt to be due to allergies. Flonase and allegra to help manage symptoms 13. Bowels and bladder:  -continue trial of myrbetriq 25mg  daily for now  See #14 14.  E Coli UTI: Macrobid started 12/14--continue for 7 days.   WBCs 11.4 on 12/14, stable 12/21  Afebrile 15.  Acute blood loss anemia  Hemoglobin 9.7 on 12/14, stable 12/21  LOS: 20 days A FACE TO [place] Zedrick Springsteen 08/11/2019, 9:05 AM

## 2019-08-11 NOTE — Progress Notes (Signed)
Hypoglycemic Event  CBG: 65  Treatment: 4 oz juice/soda  Symptoms: None  Follow-up CBG: Time:1158 CBG Result: 94  Possible Reasons for Event: Inadequate meal intake  Comments/MD notified: PA Made aware    Bertrum Helmstetter A

## 2019-08-11 NOTE — Progress Notes (Signed)
Physical Therapy Session Note  Patient Details  Name: Kathryn Coffey MRN: [id number] Date of Birth: [birth year]  Today's Date: 08/11/2019 PT Individual Time: 0805-0900 PT Individual Time Calculation (min): 55 min   Short Term Goals: Week 3:  PT Short Term Goal 1 (Week 3): STG = LTG due to estimated d/c date.  Skilled Therapeutic Interventions/Progress Updates:  Pt received in w/c & agreeable to tx. Therapist dons B socks & R tennis shoe total assist. [name] arrives for caregiver training. Therapist provides demonstration & education for donning L AFO & educates pt & husband on importance and purpose of wearing L AFO with all mobility with Ray return demonstrating ability to don orthotic. Pt emotional re: d/c home at [place] therapist provides encouragement & education. Discussed home modifications to increase safety with mobility (remove/secure all throw rugs, need to use AFO & have hands on assistance for all mobility) and discussed w/c parts management with Ray reporting he feels comfortable with this. Educated pt on need to wear resting hand splint & L PRAFO at night - recommended pt place pillow between feet to prevent RLE from hitting PRAFO as pt reports this occurs & she has discomfort so she has not been wearing PRAFO. Discussed DME recommendations & HHPT f/u. Therapist provides pt with gait belt & educates them on how to don it & use it for safety with mobility. Pt transfers sit<>stand with CGA with improved recall & ability to manage LUE on hand orthosis. Pt ambulates ~40 ft with CGA & decreased gait speed with less L knee hyperextension noted with use of heel wedge in addition to AFO. Pt & husband then return demonstrate gait with good safety awareness & use of gait belt. At end of session pt left in w/c with chair alarm donned & all needs in reach, husband present to supervise.  Ray plans to return tomorrow for caregiver training to focus on stair negotiation & real car transfer  to SUV.  Pt does report feeling anxious & excited re: d/c home this week & MD made aware. Pt perseverative on this & PA made aware of pt's request for pain medication.  Therapy Documentation Precautions:  Precautions Precautions: Fall Precaution Comments: left hemiparesis. L inattention Restrictions Weight Bearing Restrictions: No  Pain: No c/o pain reported during session.    Therapy/Group: Individual Therapy  Waunita Schooner 08/11/2019, 11:38 AM

## 2019-08-11 NOTE — Progress Notes (Signed)
Recreational Therapy Session Note  Patient Details  Name: Kathryn Coffey MRN: [id number] Date of Birth: 24-Mar-1971 Today's Date: 08/11/2019 Time:  [id number] Pain: no c/o Skilled Therapeutic Interventions/Progress Updates: Session focused on education and Designer, industrial/product.  Pt ambulated over and around various obstacles using RW with min-mod assist/instructional cues.  Pt requested need for toileting at the end of session.  Pt transferred to toilet with min assist using grab bar. Pt required assistance with getting pants down due to urgency, performed hygiene with supervision.  Pt returned to w/c with min assist and then into bed with min assist. Therapy/Group: Co-Treatment Shantanu Strauch 08/11/2019, 4:04 PM

## 2019-08-11 NOTE — Progress Notes (Signed)
Physical Therapy Session Note  Patient Details  Name: Kathryn Coffey MRN: [id number] Date of Birth: May 07, 1971  Today's Date: 08/11/2019 PT Co-Treatment Time:  - 50' w/ RT   PT total treatment time: 63'  Short Term Goals: Week 3:  PT Short Term Goal 1 (Week 3): STG = LTG due to estimated d/c date.  Skilled Therapeutic Interventions/Progress Updates: Patient presents sitting in w/c and agreeable to therapy.  Patient donned bilateral socks w/ set-up w/ figure-4 as well as right shoe.  Patient required mod A for donning left shoe w/ AFO.  Patient negotiated w/c in hallways up to 63' w/ supervision and cueing for left foot placement and advancement PRN for reciprocal LE movement.  Patient transferred sit to stand multiple trials w/ CGA to SBA.  Patient able to place left hand on RW.  Patient amb multiple trials up to 40' per trial w/ verbal cueing for left foot placement and decreasing ER.  Patient performed standing toe taps on 1 3/4" and the 3 3/4" platform to improve weight-bearing for left LE but also purposeful placement of left on 1 3/4" platform only. Patient negotiated 4 steps w/ 1 rail (right) and min A ascending and descending.  Co-treatment w/ RT for negotiation of obstacles including cone drill, amb on cushioned surface, stepping over canes and stepping on/off 4" platform.  Patient requires verbal cues for safe negotiation of same as well as mod A for raising left side of walker over/on obstacle.  Patient performed up/down ramped surface w/ min A.  Continue to progress patient for safe return to home.     Therapy Documentation Precautions:  Precautions Precautions: Fall Precaution Comments: left hemiparesis. L inattention Restrictions Weight Bearing Restrictions: No General:   Vital Signs:        Therapy/Group: Co-Treatment  Ladoris Gene 08/11/2019, 3:34 PM

## 2019-08-12 ENCOUNTER — Inpatient Hospital Stay (HOSPITAL_COMMUNITY): Payer: No Typology Code available for payment source | Admitting: Physical Therapy

## 2019-08-12 ENCOUNTER — Inpatient Hospital Stay (HOSPITAL_COMMUNITY): Payer: No Typology Code available for payment source | Admitting: Occupational Therapy

## 2019-08-12 LAB — PREGNANCY, URINE: Preg Test, Ur: NEGATIVE

## 2019-08-12 LAB — GLUCOSE, CAPILLARY
Glucose-Capillary: 109 mg/dL — ABNORMAL HIGH (ref 70–99)
Glucose-Capillary: 82 mg/dL (ref 70–99)
Glucose-Capillary: 93 mg/dL (ref 70–99)
Glucose-Capillary: 94 mg/dL (ref 70–99)

## 2019-08-12 MED ORDER — TICAGRELOR 90 MG PO TABS: 90.0000 mg | ORAL_TABLET | Freq: Two times a day (BID) | ORAL | 1 refills | Status: DC

## 2019-08-12 MED ORDER — TIZANIDINE HCL 4 MG PO TABS: 4.0000 mg | ORAL_TABLET | Freq: Every day | ORAL | 0 refills | Status: DC

## 2019-08-12 MED ORDER — LIRAGLUTIDE 18 MG/3ML ~~LOC~~ SOPN: 1.2000 mg | PEN_INJECTOR | Freq: Every day | SUBCUTANEOUS | Status: DC

## 2019-08-12 MED ORDER — BUPROPION HCL 100 MG PO TABS: 100.0000 mg | ORAL_TABLET | Freq: Two times a day (BID) | ORAL | 0 refills | Status: DC

## 2019-08-12 MED ORDER — MUSCLE RUB 10-15 % EX CREA: 1.0000 "application " | TOPICAL_CREAM | Freq: Two times a day (BID) | CUTANEOUS | 0 refills | Status: DC | PRN

## 2019-08-12 MED ORDER — TRAZODONE HCL 50 MG PO TABS: 50.0000 mg | ORAL_TABLET | Freq: Every day | ORAL | 0 refills | Status: DC

## 2019-08-12 MED ORDER — FERROUS SULFATE 325 (65 FE) MG PO TABS: 325.0000 mg | ORAL_TABLET | Freq: Every day | ORAL | 0 refills | Status: DC

## 2019-08-12 MED ORDER — AMPHETAMINE-DEXTROAMPHETAMINE 10 MG PO TABS: 10.0000 mg | ORAL_TABLET | Freq: Every day | ORAL | 0 refills | Status: DC

## 2019-08-12 MED ORDER — PANTOPRAZOLE SODIUM 40 MG PO TBEC: 40.0000 mg | DELAYED_RELEASE_TABLET | Freq: Every day | ORAL | 0 refills | Status: DC

## 2019-08-12 MED ORDER — MIRABEGRON ER 25 MG PO TB24: 25.0000 mg | ORAL_TABLET | Freq: Every day | ORAL | 0 refills | Status: DC

## 2019-08-12 MED ORDER — TOPIRAMATE 25 MG PO TABS: 25.0000 mg | ORAL_TABLET | Freq: Every day | ORAL | 0 refills | Status: DC

## 2019-08-12 NOTE — Patient Care Conference (Signed)
Inpatient RehabilitationTeam Conference and Plan of Care Update Date: 08/12/2019   Time: 10:45 AM   Patient Name: Kathryn Coffey      Medical Record Number: [id number]  Date of Birth: 1970-09-05 Sex: Female         Room/Bed: 4M08C/4M08C-01 Payor Info: Payor: [employer]OY[employer] / Plan: [place] FOCUS / Product Type: *No Product type* /    Admit Date/Time:  07/22/2019  6:05 PM  Primary Diagnosis:  Acute ischemic right middle cerebral artery (MCA) stroke [facility])  Patient Active Problem List   Diagnosis Date Noted  . E. coli UTI   . Diabetes mellitus type 2 in obese ([place])   . Chronic pain of right knee   . Flaccid monoplegia of upper extremity ([place])   . Hemiparesis affecting left side as late effect of stroke ([place])   . Acute ischemic right middle cerebral artery (MCA) stroke ([place]) 07/22/2019  . Carotid artery dissection  (HCC) s/p stent placement 07/21/2019  . Diabetes mellitus type II, uncontrolled ([place]) 07/21/2019  . Acute blood loss anemia 07/21/2019  . Leukocytosis 07/21/2019  . Stroke (cerebrum) ([place]) - R MCA infarct s/p tenecteplase and mechanical thrombectomy w/ d/t ICA dissection  07/15/2019  . Middle cerebral artery embolism, right 07/15/2019  . Controlled type 2 diabetes mellitus without complication, without long-term current use of insulin ([place]) 05/28/2017  . Attention deficit hyperactivity disorder (ADHD), combined type 04/18/2017  . Morbid obesity ([place]) 01/25/2016  . Umbilical hernia [id number]  . Hiatal hernia 12/08/2015  . History of Clostridium difficile colitis 06/19/2014  . HTN (hypertension) 12/23/2013  . PCOS (polycystic ovarian syndrome) 12/23/2013  . Bariatric surgery status 01/28/2013    Expected Discharge Date: Expected Discharge Date: 08/13/19  Team Members Present: Physician leading conference: Dr. Leeroy Cha Social Worker Present: Lennart Pall, LCSW Nurse Present: Other (comment)(Susan Truman Hayward) Case Manager: Karene Fry, RN PT Present: Lavone Nian, PT OT Present: Cherylynn Ridges, OT SLP Present: Jettie Booze, CF-SLP PPS Coordinator present : Gunnar Fusi, SLP     Current Status/Progress Goal Weekly Team Focus  Bowel/Bladder   mostly continent with very few incontinent episodes while asleep, bm 11/21  improve continence  assess toileting q shift and prn   Swallow/Nutrition/ Hydration             ADL's   CGA overall  CGA  L NMR, self-care retraining, pt/family eductaion,  dc planning   Mobility   supervision<>CGA bed mobility, CGA short distance gait with RW, min assist car transfer, min assist 4 steps with 1 rail  CGA transfers, min assist car & stairs, CGA gait with LRAD, supervision w/c mobility  pt/family education, d/c planning, balance, transfers, gait, stairs, L NMR   Communication             Safety/Cognition/ Behavioral Observations            Pain   no c/o pain   pain 0  assess pain q shift and prn   Skin   skin CDI, moitor skin beneath brace  prevent skin breakdown  assess skin q shift and prn    Rehab Goals Patient on target to meet rehab goals: Yes *See Care Plan and progress notes for long and short-term goals.     Barriers to Discharge  Current Status/Progress Possible Resolutions Date Resolved   Nursing                  PT  Inaccessible home environment;Decreased caregiver support;Home environment access/layout  unsure  if family can provide 24 hr supervision/assist at d/c              OT                  SLP                SW                Discharge Planning/Teaching Needs:  Home with spouse and teen-aged children providing 24/7 assistance.  Teaching completed with spouse   Team Discussion: Doing well, medically stable.  RN BS stable, doing well.  OT doing great, fam ed with husband done, doing ADLs in kitchen, at goal level.  PT at goal level, fam ed done.   Revisions to Treatment Plan: N/A     Medical Summary Current Status: Medically stable, pain well controlled, having regular  BM Weekly Focus/Goal: Ensure BM before discharge, last on 20th.  Barriers to Discharge: Medical stability   Possible Resolutions to Barriers: Can proceed with planned DC tomorrow.   Continued Need for Acute Rehabilitation Level of Care: The patient requires daily medical management by a physician with specialized training in physical medicine and rehabilitation for the following reasons: Direction of a multidisciplinary physical rehabilitation program to maximize functional independence : Yes Medical management of patient stability for increased activity during participation in an intensive rehabilitation regime.: Yes Analysis of laboratory values and/or radiology reports with any subsequent need for medication adjustment and/or medical intervention. : Yes   I attest that I was present, lead the team conference, and concur with the assessment and plan of the team.   Retta Diones 08/12/2019, 8:35 PM  Team conference was held via web/ teleconference due to [place] - 19

## 2019-08-12 NOTE — Progress Notes (Signed)
Pt slept well throughout night without issue 

## 2019-08-12 NOTE — Plan of Care (Signed)
  Problem: Consults Goal: RH STROKE PATIENT EDUCATION Description: See Patient Education module for education specifics  Outcome: Progressing   Problem: RH BOWEL ELIMINATION Goal: RH STG MANAGE BOWEL W/MEDICATION W/ASSISTANCE Description: STG Manage Bowel with Medication with mod I Assistance. Outcome: Progressing   Problem: RH SKIN INTEGRITY Goal: RH STG MAINTAIN SKIN INTEGRITY WITH ASSISTANCE Description: STG Maintain Skin Integrity With min Assistance. Outcome: Progressing   Problem: RH SAFETY Goal: RH STG ADHERE TO SAFETY PRECAUTIONS W/ASSISTANCE/DEVICE Description: STG Adhere to Safety Precautions With Mod I Assistance/Device. Outcome: Progressing Goal: RH STG DECREASED RISK OF FALL WITH ASSISTANCE Description: STG Decreased Risk of Fall With Mod I Assistance. Outcome: Progressing   Problem: RH PAIN MANAGEMENT Goal: RH STG PAIN MANAGED AT OR BELOW PT'S PAIN GOAL Description: Less than 3 on 0-10 scale Outcome: Progressing

## 2019-08-12 NOTE — Progress Notes (Signed)
Occupational Therapy Discharge Summary  Patient Details  Name: ALAJA GOLDINGER MRN: [id number] Date of Birth: [birth year]  Today's Date: 08/12/2019 OT Individual Time: 1300-1400 OT Individual Time Calculation (min): 60 min   Pt greeted sitting in wc with spouse present. Pt propelled wc to therapy apartment and ambulated with spouse providing CGA into bathroom. Pt practiced tub bench transfer with spouse assist and CGA. Pt then ambulated with RW and CGA from spouse to the gym. Pt took rest break and OT  Pushed pt to dayroom for holiday crafts and UB NMR. Worked on standing balanece/endurance while weight bearing through L hand to make christmas ornament. Also wiorked on ways to use L hand as a stabilizer using dysom to help hold ornament. L UE NMR with weight bearing onto L elbow, then pushing back up while standing at raised therapy mat. Rec therapist present during session and also discussed leisure awareness with pt/spouse. Pt pushed to her hallway for time management, then ambulated back to her room with spouse providing CGA and RW. Pt sat EOB and was able to doff shoes and AFO today without physical assist and min verbal cues for technique in figure 4 position. Pt and spouse feel prepared for dc home tomorrow.    Patient has met 13 of 13 long term goals due to improved activity tolerance, improved balance, postural control, ability to compensate for deficits, functional use of  LEFT upper and LEFT lower extremity, improved attention, improved awareness and improved coordination.  Patient to discharge at overall Contact Guard Assistance/Supervision level.  Patient's care partner is independent to provide the necessary physical assistance at discharge.    Reasons goals not met: n/a  Recommendation:  Patient will benefit from ongoing skilled OT services in home health setting to continue to advance functional skills in the area of BADL.  Equipment: wc, L 1/2 lap tray, tub transfer bench, drop arm  BSC, RW  Reasons for discharge: treatment goals met and discharge from hospital  Patient/family agrees with progress made and goals achieved: Yes  OT Discharge Precautions/Restrictions  Precautions Precautions: Fall Precaution Comments: left hemiparesis Restrictions Weight Bearing Restrictions: No Pain  Denies pain ADL ADL Eating: Set up Grooming: Setup Where Assessed-Grooming: Sitting at sink Upper Body Bathing: Contact guard Where Assessed-Upper Body Bathing: Sitting at sink Lower Body Bathing: Contact guard Where Assessed-Lower Body Bathing: Standing at sink, Sitting at sink Upper Body Dressing: Supervision/safety Where Assessed-Upper Body Dressing: Sitting at sink Lower Body Dressing: Minimal assistance Where Assessed-Lower Body Dressing: Sitting at sink, Standing at sink Toileting: Setup, Supervision/safety Where Assessed-Toileting: Glass blower/designer: Therapist, music Method: Arts development officer: Designer, television/film set  Perception: Impaired Inattention/Neglect: (Mild L innattention) Praxis Praxis: Intact Cognition Overall Cognitive Status: Within Functional Limits for tasks assessed Arousal/Alertness: Awake/alert Orientation Level: Oriented X4 Memory: Impaired Memory Impairment: Decreased recall of new information Awareness: Appears intact Problem Solving: Appears intact Safety/Judgment: Appears intact Sensation Sensation Light Touch Impaired Details: (continues to report mild numbness in L UE, but improved) Coordination Gross Motor Movements are Fluid and Coordinated: No Fine Motor Movements are Fluid and Coordinated: No Coordination and Movement Description: 2/2 L hemiparesis affecting UE & LE Motor  Motor Motor - Discharge Observations: generalized deconditioning, L hemiparesis Mobility  Bed Mobility Bed Mobility: Rolling Right;Sit to Supine;Supine to Sit Rolling Right:  Supervision/verbal cueing Supine to Sit: Supervision/Verbal cueing Sit to Supine: Supervision/Verbal cueing Transfers Sit to Stand: Contact Guard/Touching assist Stand to Sit: Contact Guard/Touching assist  Balance Balance Balance Assessed: Yes Static Sitting Balance Static Sitting - Balance Support: Feet supported;Right upper extremity supported Static Sitting - Level of Assistance: 5: Stand by assistance Dynamic Sitting Balance Dynamic Sitting - Level of Assistance: 5: Stand by assistance Static Standing Balance Static Standing - Balance Support: No upper extremity supported;During functional activity Static Standing - Level of Assistance: Other (comment)(CGA) Dynamic Standing Balance Dynamic Standing - Balance Support: During functional activity Dynamic Standing - Level of Assistance: Other (comment)(CGA) Extremity/Trunk Assessment RUE Assessment RUE Assessment: Within Functional Limits LUE Assessment LUE Assessment: Exceptions to [facility] General Strength Comments: Impropved proximal shoulder and scapula strength LUE Body System: Neuro Brunstrum levels for arm and hand: Arm;Hand Brunstrum level for arm: Stage III Synergy is performed voluntarily Brunstrum level for hand: Stage I Flaccidity(some synergy developing at times, but very inconsistent)   Daneen Schick Peola Joynt 08/12/2019, 3:36 PM

## 2019-08-12 NOTE — Progress Notes (Signed)
[place] PHYSICAL MEDICINE & REHABILITATION PROGRESS NOTE   Subjective/Complaints: Patient seen sitting up in bed this WC this morning. She states she slept fairly overnight.  Denies pain, constipation. She feels anxious-excited about going home this week.    ROS: Denies CP, SOB, N/V/D  Objective:   No results found. Recent Labs    08/11/19 0600  WBC 7.3  HGB 9.5*  HCT 31.9*  PLT 422*   Recent Labs    08/11/19 0600  NA 139  K 3.6  CL 106  CO2 23  GLUCOSE 115*  BUN 15  CREATININE 0.63  CALCIUM 9.1    Intake/Output Summary (Last 24 hours) at 08/12/2019 0908 Last data filed at 08/12/2019 0847 Gross per 24 hour  Intake 480 ml  Output --  Net 480 ml     Physical Exam: Vital Signs Blood pressure 121/80, pulse 64, temperature 97.9 F (36.6 C), temperature source Oral, resp. rate 18, height 5\' 5"  (1.651 m), weight 116.5 kg, SpO2 99 %. Constitutional: No distress . Vital signs reviewed. Sitting up in [place]. HENT: Normocephalic.  Atraumatic. Eyes: EOMI. No discharge. Cardiovascular: No JVD. Respiratory: Normal effort.  No stridor. GI: Non-distended. Skin: Warm and dry.  Intact. Psych: Normal mood.  Normal behavior. Musc: No edema in extremities.  No tenderness in extremities. Neuro: Alert Left facial droop Motor: RUE/RLE: 5/5 proximal distal LUE: 0/5 proximal distal, unchanged LLE: 4--4/5 proximal to distal  No increase in tone appreciated  Assessment/Plan: 1. Functional deficits secondary to acute ischemic right MCA stroke which require 3+ hours per day of interdisciplinary therapy in a comprehensive inpatient rehab setting.  Physiatrist is providing close team supervision and 24 hour management of active medical problems listed below.  Physiatrist and rehab team continue to assess barriers to discharge/monitor patient progress toward functional and medical goals  Care Tool:  Bathing    Body parts bathed by patient: Chest, Abdomen, Right upper leg, Left  upper leg, Right lower leg, Face, Right arm, Left arm, Left lower leg, Front perineal area, Buttocks   Body parts bathed by helper: Front perineal area, Buttocks, Left lower leg, Left arm, Right lower leg     Bathing assist Assist Level: Contact Guard/Touching assist     Upper Body Dressing/Undressing Upper body dressing   What is the patient wearing?: Bra, Pull over shirt    Upper body assist Assist Level: Moderate Assistance - Patient 50 - 74%    Lower Body Dressing/Undressing Lower body dressing      What is the patient wearing?: Pants, Incontinence brief     Lower body assist Assist for lower body dressing: Moderate Assistance - Patient 50 - 74%     Toileting Toileting    Toileting assist Assist for toileting: Minimal Assistance - Patient > 75%     Transfers Chair/bed transfer  Transfers assist     Chair/bed transfer assist level: Contact Guard/Touching assist     Locomotion Ambulation   Ambulation assist      Assist level: Contact Guard/Touching assist Assistive device: Walker-rolling Max distance: 40   Walk 10 feet activity   Assist     Assist level: Contact Guard/Touching assist Assistive device: Walker-rolling, Orthosis   Walk 50 feet activity   Assist Walk 50 feet with 2 turns activity did not occur: Safety/medical concerns  Assist level: Minimal Assistance - Patient > 75% Assistive device: Orthosis, Walker-rolling    Walk 150 feet activity   Assist Walk 150 feet activity did not occur: Safety/medical concerns  Walk 10 feet on uneven surface  activity   Assist Walk 10 feet on uneven surfaces activity did not occur: Safety/medical concerns         Wheelchair     Assist Will patient use wheelchair at discharge?: Yes Type of Wheelchair: Manual    Wheelchair assist level: Supervision/Verbal cueing Max wheelchair distance: 50    Wheelchair 50 feet with 2 turns activity    Assist        Assist  Level: Supervision/Verbal cueing   Wheelchair 150 feet activity     Assist  Wheelchair 150 feet activity did not occur: Safety/medical concerns       Blood pressure 121/80, pulse 64, temperature 97.9 F (36.6 C), temperature source Oral, resp. rate 18, height 5\' 5"  (1.651 m), weight 116.5 kg, SpO2 99 %.    Medical Problem List and Plan: 1.    Left hemiparesis with flaccid monoplegia, impaired mobility and strength secondary to R MCA infarct            Continue CIR  -left WHO and PRAFO at night 2.  Antithrombotics: -DVT/anticoagulation:  Pharmaceutical: Lovenox             -antiplatelet therapy: ASA/Brillinta 3. Chronic R>L knee pain/Pain Management: Added Sportscreme prn. May need right knee sleeve for support.              Chronic right shoulder scapular dyskinesis--likely worsened by increased use due to flaccid left extremity. No real benefits with TPI's. Rhomboids most involved  -tizanidine  4mg  QHS only to help limit sedation,  to help with left foot, hand spasms  -continue kpad  -Scapular ROM with therapies   -lidoderm patch  Controlled on 12/20 with medications 4. Mood: LCSW to follow for evaluation and support.              -antipsychotic agents: N/A             -resumed wellbutrin per home dose  -resumed her adderall per home dose which she uses for impulsivity  -appreciate neuropsych eval  -continue trazodone to help with sleep 5. Neuropsych: This patient is capable of making decisions on her own behalf. 6. Skin/Wound Care: Routine pressure relief measures.  7. Fluids/Electrolytes/Nutrition: Monitor I/O.   -encourage PO   -discussed with pt that we can use multivit, fe+ but not her proprietary mixture  Labs stable this morning.  8. Chronic HA: Takes goody powders/Motrin/Ultram. Continue tylenol prn.   Low dose topamax added--continue for now  Controlled on 12/20-12/21 9. T2DM: Hgb A1c-8.0.  Monitor diet.   Resumed home dose metformin and Victoza    Controlled  on 12/20-12/21 11. H/o band failure/gastric sleeve: Continue multivitamin. Appetite has been good.   Morbid obesity-encourage weight loss 12.  Sinus pressure: generally better.  Felt to be due to allergies. Flonase and allegra to help manage symptoms 13. Bowels and bladder:  -continue trial of myrbetriq 25mg  daily for now  See #14 14.  E Coli UTI: Macrobid started 12/14--continue for 7 days.   WBCs 11.4 on 12/14, stable 12/21  Afebrile 15.  Acute blood loss anemia  Hemoglobin 9.7 on 12/14, stable 12/21  LOS: 21 days A FACE TO [place] 08/12/2019, 9:08 AM

## 2019-08-12 NOTE — Discharge Instructions (Signed)
Inpatient Rehab Discharge Instructions  KELILA DEVAUL Discharge date and time:  08/13/19  Activities/Precautions/ Functional Status: Activity: no lifting, driving, or strenuous exercise for till cleared by MD Diet: cardiac diet and diabetic diet Wound Care: none needed    Functional status:  ___ No restrictions     ___ Walk up steps independently ___ 24/7 supervision/assistance   ___ Walk up steps with assistance ___ Intermittent supervision/assistance  ___ Bathe/dress independently ___ Walk with walker     ___ Bathe/dress with assistance ___ Walk Independently    ___ Shower independently _X__ Walk with assistance    _X__ Shower with assistance _X__ No alcohol     ___ Return to work/school ________   COMMUNITY REFERRALS UPON DISCHARGE:    Home Health:   PT     OT                       Agency:  [facility]    Phone: [phone]   Medical Equipment/Items Ordered:  Wheelchair, cushion, walker, commode and tub bench                                                      Agency/Supplier:  [facility] @ [phone]   Special Instructions: 1. Check blood sugars before meals and at bedtime. Take results to PCP. 2. Blood pressure goal   STROKE/TIA DISCHARGE INSTRUCTIONS SMOKING Cigarette smoking nearly doubles your risk of having a stroke & is the single most alterable risk factor  If you smoke or have smoked in the last 12 months, you are advised to quit smoking for your health.  Most of the excess cardiovascular risk related to smoking disappears within a year of stopping.  Ask you doctor about anti-smoking medications  [place] Quit Line: 1-800-QUIT NOW  Free Smoking Cessation Classes [phone]  CHOLESTEROL Know your levels; limit fat & cholesterol in your diet  Lipid Panel     Component Value Date/Time   CHOL 129 07/16/2019 0450   TRIG 309 (H) 07/16/2019 0450   TRIG 321 (H) 07/16/2019 0450   HDL 26 (L) 07/16/2019 0450   CHOLHDL 5.0 07/16/2019 0450   VLDL 62 (H)  07/16/2019 0450   LDLCALC 41 07/16/2019 0450      Many patients benefit from treatment even if their cholesterol is at goal.  Goal: Total Cholesterol (CHOL) less than 160  Goal:  Triglycerides (TRIG) less than 150  Goal:  HDL greater than 40  Goal:  LDL (LDLCALC) less than 100   BLOOD PRESSURE American Stroke Association blood pressure target is less that 120/80 mm/Hg  Your discharge blood pressure is:  BP: 108/76  Monitor your blood pressure  Limit your salt and alcohol intake  Many individuals will require more than one medication for high blood pressure  DIABETES (A1c is a blood sugar average for last 3 months) Goal HGBA1c is under 7% (HBGA1c is blood sugar average for last 3 months)  Diabetes:     Lab Results  Component Value Date   HGBA1C 8.0 (H) 07/16/2019     Your HGBA1c can be lowered with medications, healthy diet, and exercise.  Check your blood sugar as directed by your physician  Call your physician if you experience unexplained or low blood sugars.  PHYSICAL ACTIVITY/REHABILITATION Goal is 30 minutes at  least 4 days per week  Activity: No driving, Therapies: see above Return to work: N/A  Activity decreases your risk of heart attack and stroke and makes your heart stronger.  It helps control your weight and blood pressure; helps you relax and can improve your mood.  Participate in a regular exercise program.  Talk with your doctor about the best form of exercise for you (dancing, walking, swimming, cycling).  DIET/WEIGHT Goal is to maintain a healthy weight  Your discharge diet is:  Diet Order            Diet regular Room service appropriate? Yes; Fluid consistency: Thin  Diet effective now             liquids Your height is:  Height: 5\' 5"  (165.1 cm) Your current weight is: Weight: 116.5 kg Your Body Mass Index (BMI) is:  BMI (Calculated): 42.74  Following the type of diet specifically designed for you will help prevent another stroke.  Your  goal weight is:  150 lbs  Your goal Body Mass Index (BMI) is 19-24.  Healthy food habits can help reduce 3 risk factors for stroke:  High cholesterol, hypertension, and excess weight.  RESOURCES Stroke/Support Group:  Call [phone]   STROKE EDUCATION PROVIDED/REVIEWED AND GIVEN TO PATIENT Stroke warning signs and symptoms How to activate emergency medical system (call 911). Medications prescribed at discharge. Need for follow-up after discharge. Personal risk factors for stroke. Pneumonia vaccine given:  Flu vaccine given:  My questions have been answered, the writing is legible, and I understand these instructions.  I will adhere to these goals & educational materials that have been provided to me after my discharge from the hospital.    My questions have been answered and I understand these instructions. I will adhere to these goals and the provided educational materials after my discharge from the hospital.  Patient/Caregiver Signature _______________________________ Date __________  Clinician Signature _______________________________________ Date __________  Please bring this form and your medication list with you to all your follow-up doctor's appointments.

## 2019-08-12 NOTE — Plan of Care (Signed)
  Problem: RH Balance Goal: LTG: Patient will maintain dynamic sitting balance (OT) Description: LTG:  Patient will maintain dynamic sitting balance with assistance during activities of daily living (OT) Outcome: Completed/Met   Problem: Sit to Stand Goal: LTG:  Patient will perform sit to stand in prep for activites of daily living with assistance level (OT) Description: LTG:  Patient will perform sit to stand in prep for activites of daily living with assistance level (OT) Outcome: Completed/Met   Problem: RH Eating Goal: LTG Patient will perform eating w/assist, cues/equip (OT) Description: LTG: Patient will perform eating with assist, with/without cues using equipment (OT) Outcome: Completed/Met   Problem: RH Grooming Goal: LTG Patient will perform grooming w/assist,cues/equip (OT) Description: LTG: Patient will perform grooming with assist, with/without cues using equipment (OT) Outcome: Completed/Met   Problem: RH Bathing Goal: LTG Patient will bathe all body parts with assist levels (OT) Description: LTG: Patient will bathe all body parts with assist levels (OT) Outcome: Completed/Met   Problem: RH Dressing Goal: LTG Patient will perform upper body dressing (OT) Description: LTG Patient will perform upper body dressing with assist, with/without cues (OT). Outcome: Completed/Met Goal: LTG Patient will perform lower body dressing w/assist (OT) Description: LTG: Patient will perform lower body dressing with assist, with/without cues in positioning using equipment (OT) Outcome: Completed/Met   Problem: RH Toileting Goal: LTG Patient will perform toileting task (3/3 steps) with assistance level (OT) Description: LTG: Patient will perform toileting task (3/3 steps) with assistance level (OT)  Outcome: Completed/Met   Problem: RH Functional Use of Upper Extremity Goal: LTG Patient will use RT/LT upper extremity as a (OT) Description: LTG: Patient will use right/left upper  extremity as a stabilizer/gross assist/diminished/nondominant/dominant level with assist, with/without cues during functional activity (OT) Outcome: Completed/Met   Problem: RH Toilet Transfers Goal: LTG Patient will perform toilet transfers w/assist (OT) Description: LTG: Patient will perform toilet transfers with assist, with/without cues using equipment (OT) Outcome: Completed/Met   Problem: RH Tub/Shower Transfers Goal: LTG Patient will perform tub/shower transfers w/assist (OT) Description: LTG: Patient will perform tub/shower transfers with assist, with/without cues using equipment (OT) Outcome: Completed/Met   Problem: RH Awareness Goal: LTG: Patient will demonstrate awareness during functional activites type of (OT) Description: LTG: Patient will demonstrate awareness during functional activites type of (OT) Outcome: Completed/Met   Problem: RH Balance Goal: LTG Patient will maintain dynamic standing with ADLs (OT) Description: LTG:  Patient will maintain dynamic standing balance with assist during activities of daily living (OT)  Outcome: Completed/Met

## 2019-08-12 NOTE — Discharge Summary (Signed)
Physician Discharge Summary  Patient ID: Kathryn Coffey MRN: [id number] DOB/AGE: April 29, 1971 48 y.o.  Admit date: 07/22/2019 Discharge date: 08/13/2019  Discharge Diagnoses:  Principal Problem:   Acute ischemic right middle cerebral artery (MCA) stroke (HCC) Active Problems:   E. coli UTI   Diabetes mellitus type 2 in obese [name]   Chronic pain of right knee   Flaccid monoplegia of upper extremity (HCC)   Hemiparesis affecting left side as late effect of stroke ([place])   Discharged Condition: good  Significant Diagnostic Studies: No results found.  Labs:  Basic Metabolic Panel: BMP Latest Ref Rng & Units 08/11/2019 08/04/2019 07/28/2019  Glucose 70 - 99 mg/dL 115(H) 105(H) 102(H)  BUN 6 - 20 mg/dL 15 13 16   Creatinine 0.44 - 1.00 mg/dL 0.63 0.72 0.64  Sodium 135 - 145 mmol/L 139 140 139  Potassium 3.5 - 5.1 mmol/L 3.6 3.9 3.6  Chloride 98 - 111 mmol/L 106 107 106  CO2 22 - 32 mmol/L 23 22 22   Calcium 8.9 - 10.3 mg/dL 9.1 9.2 8.9    CBC: CBC Latest Ref Rng & Units 08/11/2019 08/04/2019 07/28/2019  WBC 4.0 - 10.5 K/uL 7.3 11.4(H) 11.0(H)  Hemoglobin 12.0 - 15.0 g/dL 9.5(L) 9.7(L) 9.3(L)  Hematocrit 36.0 - 46.0 % 31.9(L) 32.2(L) 30.6(L)  Platelets 150 - 400 K/uL 422(H) 508(H) 571(H)    CBG: Recent Labs  Lab 08/12/19 0626 08/12/19 1127 08/12/19 1627 08/12/19 2117 08/13/19 0652  GLUCAP 109* 82 93 94 144*    Brief HPI:   Kathryn Coffey is a 48 y.o. female with history of HTN, T2DM, obesity who was admitted on 07/15/19 with onset of HA progressing to flaccid left hemiparesis a few hours later.  CT head showed acute nonhemorrhagic infarct in right basal ganglia with hyperdense right MCA sign.  CT A/P head neck showed perfusion deficit with occluded right ICA and slow flow in M1.  She underwent cerebral angiogram with revascularization of right MCA with T1C1 revascularization and repair of right ICA with flow diverter device. Stroke felt to be embolic likely due to ICA  dissection.   Postprocedure head CT was negative for bleed repeat CTA head neck showed reocclusion of right ICA origin and occlusion of right ICA stent but now with patent right MCA.    She tolerated extubation on 1127 and follow-up MRI 11/26 showed hemorrhagic transformation right caudate and right lenticular nucleus extending into the insula and right temporal lobe.  Dr. Erlinda Hong recommended continuing ASA/Brilinta as well as 30-day event monitor after discharge.  Long-term BP goals 130- 50 range given stent occlusion.  Patient with resultant left-sided weakness with left inattention affecting mobility and ADLs.  CIR was recommended due to functional decline.   Hospital Course: Kathryn Coffey was admitted to rehab 07/22/2019 for inpatient therapies to consist of PT, ST and OT at least three hours five days a week. Past admission physiatrist, therapy team and rehab RN have worked together to provide customized collaborative inpatient rehab. Diabetes has been monitored with ac/hs CBG checks and SSI was use prn for tighter BS control. Home dose Victoza and metformin was resumed with improvement in blood sugars. She reported chronic right shoulder pain which was worsened by flaccid LUE and tizanidine was added at bedtime to manage symptoms and limit SE.  Wellbutrin was resumed and titrated upwards to help with mood stabilization. Team has provided ego support and Dr. Sima Matas has been assisting patient on strategies for coping with anxiety and loss of function.  Home dose Adderall was resumed to help manage impulsivity. Headaches have improved with addition of Topamax. Blood pressure have been controlled. Follow up labs showed that renal status is stable and leucocytosis has resolved. H/H and platelets are stable on brilinta.  She reported frequency/urgency and was found to have E coli UTI. She was treated with 7 day course of Macrobid and Myrbetriq was added due to ongoing frequency. She is continent of bowel and  bladder.  She has made gains during rehab stay and is at supervision to min assist level. She will continue to receive follow up HHPT and [place] [facility] after discharge.   Rehab course: During patient's stay in rehab weekly team conferences were held to monitor patient's progress, set goals and discuss barriers to discharge. At admission, patient required max assist with mobility and ADL tasks. Cognitive evaluation revealed mild impairment affecting attention with mild dysphagia and weak vocal quality. She  has had improvement in activity tolerance, balance, postural control as well as ability to compensate for deficits. He/She has had improvement in functional use LUE  and LLE as well as improvement in awareness. She is able to complete ADL tasks with CGA. She requires CGA for transfers and to ambulate 120' with RW and increased time.   Discharge disposition: 01-Home or Self Care  Diet: Heart health/Carb modified.   Special Instructions: 1. Recommend repeat CBC in a couple of weeks for follow up on H/H.  2. Follow up with allergist for input on resuming allergy injections.  3. BP goal 130-150 range per neurology recommendations.    Discharge Instructions    Ambulatory referral to Cardiology   Complete by: As directed    Needs 30 day event monitor   Ambulatory referral to Physical Medicine Rehab   Complete by: As directed    1-2 weeks TC appt     Allergies as of 08/13/2019      Reactions   Azithromycin Other (See Comments)   Abdominal wall abcess   Ivp Dye [iodinated Diagnostic Agents] Hives   Lexapro [escitalopram] Other (See Comments)   migraine   Vicodin [hydrocodone-acetaminophen] Other (See Comments)   migraine      Medication List    STOP taking these medications   enoxaparin 40 MG/0.4ML injection Commonly known as: LOVENOX   insulin aspart 100 UNIT/ML injection Commonly known as: novoLOG   insulin detemir 100 UNIT/ML injection Commonly known as: LEVEMIR    omeprazole 20 MG capsule Commonly known as: PRILOSEC   oxyCODONE 5 MG immediate release tablet Commonly known as: Oxy IR/ROXICODONE   traMADol 50 MG tablet Commonly known as: ULTRAM     TAKE these medications   acetaminophen 325 MG tablet Commonly known as: TYLENOL Take 1-2 tablets (325-650 mg total) by mouth every 4 (four) hours as needed for mild pain.   albuterol 108 (90 Base) MCG/ACT inhaler Commonly known as: VENTOLIN HFA Inhale 2 puffs into the lungs every 6 (six) hours as needed for wheezing or shortness of breath.   amphetamine-dextroamphetamine 10 MG tablet Commonly known as: ADDERALL Take 1 tablet (10 mg total) by mouth daily with breakfast.   aspirin 81 MG chewable tablet Chew 1 tablet (81 mg total) by mouth daily.   buPROPion 100 MG tablet Commonly known as: WELLBUTRIN Take 1 tablet (100 mg total) by mouth 2 (two) times daily. What changed:   medication strength  how much to take   CALCIUM + VITAMIN D3 PO Take 1 tablet by mouth daily.  ferrous sulfate 325 (65 FE) MG tablet Take 1 tablet (325 mg total) by mouth daily with breakfast.   fexofenadine 180 MG tablet Commonly known as: ALLEGRA Take 180 mg by mouth daily as needed for allergies.   fluticasone 50 MCG/ACT nasal spray Commonly known as: FLONASE Place 2 sprays into both nostrils daily as needed for allergies or rhinitis.   liraglutide 18 MG/3ML Sopn Commonly known as: VICTOZA Inject 0.2 mLs (1.2 mg total) into the skin daily.   Melatonin 3 MG Tabs Take 2 tablets (6 mg total) by mouth at bedtime.   metFORMIN 500 MG tablet Commonly known as: GLUCOPHAGE Take 500 mg by mouth 2 (two) times daily.   mirabegron ER 25 MG Tb24 tablet Commonly known as: MYRBETRIQ Take 1 tablet (25 mg total) by mouth daily.   MULTIVITAMIN PO Take 2 tablets by mouth daily.   Muscle Rub 10-15 % Crea Apply 1 application topically 2 (two) times daily as needed for muscle pain.   NON FORMULARY 1 Dose once a  week. Wednesdays   pantoprazole 40 MG tablet Commonly known as: PROTONIX Take 1 tablet (40 mg total) by mouth at bedtime.   ticagrelor 90 MG Tabs tablet Commonly known as: BRILINTA Take 1 tablet (90 mg total) by mouth 2 (two) times daily.   tiZANidine 4 MG tablet Commonly known as: ZANAFLEX Take 1 tablet (4 mg total) by mouth at bedtime.   topiramate 25 MG tablet Commonly known as: TOPAMAX Take 1 tablet (25 mg total) by mouth at bedtime.   traZODone 50 MG tablet Commonly known as: DESYREL Take 1 tablet (50 mg total) by mouth at bedtime.      Follow-up Information    Meredith Staggers, MD Follow up.   Specialty: Physical Medicine and Rehabilitation Why: Office will call you with follow up appointment Contact information: [place] [address] [phone]        [facility]. Call.   Why: for post stroke follow up Contact information: [address]     [address] [place] [phone]       Rusty Aus, MD. Call.   Specialty: Internal Medicine Why: in 1-2 days for post hospital follow up Contact information: [place] [address] [place] [facility] Follow up.   Specialty: Cardiology Why: will call you to set up for a 30 day event monitor.  Contact information: [place] [id number] [place] [phone]          Signed: Bary Leriche 08/18/2019, 10:54 PM

## 2019-08-12 NOTE — Progress Notes (Signed)
Recreational Therapy Session Note  Patient Details  Name: Kathryn Coffey MRN: [id number] Date of Birth: [birth year] Today's Date: 08/12/2019 Time:  [id number] Pain: no c/o Skilled Therapeutic Interventions/Progress Updates: Session focused on activity modification, activity tolerance, and dynamic standing balance during co-treat with OT.  Pt stood with UE support for simple Christmas craft standing with contact guard assist.  Pt also used LUE as a stabilizer with min assist to complete another tabletop craft.  Educated pt and husband on how to use leisure tasks as a therapeutic intervention to address balance & LUE use.  Both stated understanding.  Pt anxious to discharge home.  Therapy/Group: Co-Treatment   Miloh Alcocer 08/12/2019, 2:54 PM

## 2019-08-12 NOTE — Progress Notes (Signed)
Physical Therapy Discharge Summary  Patient Details  Name: Kathryn Coffey MRN: [id number] Date of Birth: 30-May-1971  Today's Date: 08/12/2019 PT Individual Time: 0852-1000 PT Individual Time Calculation (min): 68 min    Patient has met 8 of 8 long term goals due to improved activity tolerance, improved balance, improved postural control, increased strength, ability to compensate for deficits, functional use of  left lower extremity, improved awareness and improved coordination.  Patient to discharge at ambulatory with RW & CGA level.   Patient's care partner is independent to provide the necessary physical assistance at discharge.  Reasons goals not met: n/a  Recommendation:  Patient will benefit from ongoing skilled PT services in home health setting to continue to advance safe functional mobility, address ongoing impairments in L NMR, transfers, gait, stair negotiation, balance, endurance, w/c mobility, and minimize fall risk.  Equipment: RW with hand orthosis, 20x18 w/c with half lap tray  Reasons for discharge: treatment goals met and discharge from hospital  Patient/family agrees with progress made and goals achieved: Yes   Skilled PT Treatment: Pt received in bed & agreeable. Pt reports unrated sore LLE calf & rest breaks provided PRN. Pt transfers supine>sit with supervision and extra time with pt attempting to use rail despite instruction not to do so. Therapist donned socks, shoes & L AFO total assist for time management. Pt transfers sit<>stand with CGA and ambulates 120 ft with RW & CGA with decreased gait speed. Pt completes bed mobility, supine<>sit and rolling R with supervision with bed flat and no rails and stand pivot to w/c on L with CGA.  [name] then arrived for caregiver training. Pt's husband assisted pt with doffing shoes & AFO then pt reports need to use restroom. Therapist educates pt & Ray on importance of using AFO & shoes to transfer at home but at  this time with PT supervision pt used non skid grip socks. Ray assisted pt with clothing management and transferring w/c<>toilet with min assist stand pivot. Ray assisted pt with donning brief, pants, shoes & L AFO. Pt then transported to gym and pt & therapist negotiated 4 steps with 1 rail (pt reports she has B rails installed now & prefers to negotiate stairs forwards) with min assist with instructional cuing for compensatory pattern. Pt & husband then return demonstrated stair negotiation with Ray assisting pt appropriately after instructional cuing & use of gait belt. Pt & Ray then completed car transfer at SUV simulated height with therapist providing step by step verbal instructional cuing and Ray providing min assist overall & pt requiring assistance for placing LLE in/out of car. Pt & Ray then ambulated ortho gym>room with RW & min assist. At end of session pt left in w/c with husband present to supervise. Pt requesting to sit in recliner but therapist instructed her to wait until floor dried as it had just been cleaned by housekeeping; pt & husband already checked off to perform stand pivot transfers. Therapist also encouraged pt to stay active while at home and walk as much as possible with husband assistance, but to limit ambulation to indoors until she has practiced ambulating over uneven surfaces more with HHPT.  PT Discharge Precautions/Restrictions Precautions Precautions: Fall Precaution Comments: left hemiparesis Restrictions Weight Bearing Restrictions: No  Vision/Perception  Pt wears glasses at home while working on computer 2/2 astigmatism. Pt denies changes in baseline vision. Perception Perception: Impaired Inattention/Neglect: (decreased attention to L side of body)   Cognition Overall Cognitive Status: Within Functional  Limits for tasks assessed Arousal/Alertness: Awake/alert Orientation Level: Oriented X4 Memory: Impaired Memory Impairment: Decreased recall of new  information Awareness: Appears intact Problem Solving: Appears intact Safety/Judgment: Appears intact   Sensation Sensation Light Touch: Not tested Additional Comments: pt reports "a little numbness" on the L side of her face Coordination Gross Motor Movements are Fluid and Coordinated: No Fine Motor Movements are Fluid and Coordinated: No Coordination and Movement Description: 2/2 L hemiparesis affecting UE & LE   Motor  Motor Motor: Abnormal postural alignment and control Motor - Discharge Observations: generalized deconditioning, L hemiparesis   Mobility Bed Mobility Bed Mobility: Rolling Right;Sit to Supine;Supine to Sit(bed flat, no rails, decreased attention to LUE leaving it behind her) Rolling Right: Supervision/verbal cueing Supine to Sit: Supervision/Verbal cueing Sit to Supine: Supervision/Verbal cueing Transfers Transfers: Sit to Stand;Stand to Sit;Stand Pivot Transfers Sit to Stand: Contact Guard/Touching assist with RW Stand to Sit: Contact Guard/Touching assist with RW Stand Pivot Transfers: Contact Guard/Touching assist(bed rails, w/c armrests intermittently) Stand Pivot Transfer Details: intermittent Verbal cues for sequencing;Verbal cues for technique;Verbal cues for precautions/safety   Locomotion  Gait Ambulation: Yes Gait Assistance: Contact Guard/Touching assist Gait Distance (Feet): 120 Feet Assistive device: Rolling walker(L AFO with heel wedge, L hand orthosis) Gait Gait: Yes Gait Pattern: Impaired Gait Pattern: Decreased step length - left;Decreased stance time - left;Decreased stride length;Step-to pattern;Step-through pattern;Decreased weight shift to left;Decreased dorsiflexion - left;Decreased hip/knee flexion - left;Poor foot clearance - left(LLE externally rotated, increased weight shifting to R) Gait velocity: decreased Stairs / Additional Locomotion Stairs: Yes Stairs Assistance: Minimal Assistance - Patient > 75% Stair Management  Technique: One rail Number of Stairs: 4 Height of Stairs: 6(inches) Ramp: Minimal Assistance - Patient >75%(ambulatory with RW, L AFO, L hand orthosis) Wheelchair Mobility Wheelchair Mobility: Yes Wheelchair Assistance: Supervision/Verbal cueing Wheelchair Propulsion: Right upper extremity;Right lower extremity Wheelchair Parts Management: Needs assistance Distance: 100 ft   Trunk/Postural Assessment  Cervical Assessment Cervical Assessment: Exceptions to WFL(limited L cervical rotation, pt reports a little stiffness that radiates to R scapular region) Thoracic Assessment Thoracic Assessment: Exceptions to WFL(rounded shoulders) Lumbar Assessment Lumbar Assessment: Exceptions to WFL(posterior pelvic tilt) Postural Control Postural Control: Deficits on evaluation Righting Reactions: delayed Protective Responses: delayed   Balance Balance Balance Assessed: Yes Static Sitting Balance Static Sitting - Balance Support: Feet supported;Right upper extremity supported Static Sitting - Level of Assistance: 5: Stand by assistance Static Standing Balance Static Standing - Balance Support: No upper extremity supported;During functional activity Static Standing - Level of Assistance: 4: Min assist while Engineer, manufacturing systems Standing Balance Dynamic Standing - Balance Support: Bilateral upper extremity supported;During functional activity Dynamic Standing - Level of Assistance: (CGA) Dynamic Standing - Comments: gait with RW with BUE support    Extremity Assessment  RUE Assessment RUE Assessment: Within Functional Limits LUE Assessment LUE Assessment: Exceptions to [facility] General Strength Comments: Impropved proximal shoulder and scapula strength LUE Body System: Neuro Brunstrum levels for arm and hand: Arm;Hand Brunstrum level for arm: Stage III Synergy is performed voluntarily Brunstrum level for hand: Stage I Flaccidity(some synergy developing at times, but very inconsistent)    RLE Assessment RLE Assessment: Within Functional Limits LLE Assessment LLE Assessment: Exceptions to [facility] Passive Range of Motion (PROM) Comments: [facility] General Strength Comments: hip flexion = 2/5, ankle dorsiflexion = 2/5, knee extension = 3-/5    Waunita Schooner 08/12/2019, 3:48 PM

## 2019-08-12 NOTE — Progress Notes (Signed)
Occupational Therapy Session Note  Patient Details  Name: Kathryn Coffey MRN: [id number] Date of Birth: 1971-05-15  Today's Date: 08/12/2019 OT Individual Time: 1100-1155 OT Individual Time Calculation (min): 55 min   Short Term Goals: Week 3:  OT Short Term Goal 1 (Week 3): LTG=STG 2/2 ELOS  Skilled Therapeutic Interventions/Progress Updates:  Session 1  Pt greeted sitting in recliner with spouse present. Discussed dc plan, home management, and answered questions regarding BADL and iADL tasks. Pt's spouse stated that someone donated them a lift recliner. OT educated on importance of building strength with sit<>stands and encouraged pt NOT to use lift recliner. Pt and spouse agreeable. OT educated on S[employer] e-stim system that they have ordered for home use. OT also educated on Giv-Mor sling for if/when pt transitions to a cane. Educated on importance of supporting shoulder girdle. 1:1 NMES applied to wrist extensors. Ratio 1:3 Rate 35 pps Waveform- Asymmetric Ramp 1.0 Pulse 300 Intensity- 20 Duration - 15    Report of pain at the beginning of session 0 Report of pain at the end of session 0  No adverse reactions after treatment and is skin intact.   Educated pt and spouse on neuro re-ed exercises including weight bearing activities. Had pt practice pushing cup across table with slight wrist extension noted while horizontally adducting. Pt left sitting in recliner at end of session with spouse present and needs met.   Therapy Documentation Precautions:  Precautions Precautions: Fall Precaution Comments: left hemiparesis Restrictions Weight Bearing Restrictions: No Pain: Denies pain  Therapy/Group: Individual Therapy  Valma Cava 08/12/2019, 10:53 AM

## 2019-08-13 LAB — GLUCOSE, CAPILLARY: Glucose-Capillary: 144 mg/dL — ABNORMAL HIGH (ref 70–99)

## 2019-08-13 NOTE — Progress Notes (Signed)
Recreational Therapy Discharge Summary Patient Details  Name: Kathryn Coffey MRN: [id number] Date of Birth: 07/15/1971 Today's Date: 08/13/2019  Long term goals set: 1  Long term goals met: 1  Comments on progress toward goals: Pt has made good progress during LOS is ready for discharge home with husband to provide 24 hour supervision/assistance.  TR sessions focused on leisure education, activity analysis identifying potential adaptations & community reintegration. Goal met.  Reasons for discharge: discharge from hospital Patient/family agrees with progress made and goals achieved: Yes  Ballard Budney 08/13/2019, 11:06 AM

## 2019-08-13 NOTE — Progress Notes (Signed)
Provider discussed discharge instructions with patient and her husband. Patient refused her pneumonococcal 23 valent vaccine prior to discharge. All of patient's medications were returned to her. Patient left in her own clothes with all her belongings and equipment. Patient discharging with her husband home.

## 2019-08-13 NOTE — Plan of Care (Signed)
  Problem: Consults Goal: RH STROKE PATIENT EDUCATION Description: See Patient Education module for education specifics  Outcome: Progressing   Problem: RH BOWEL ELIMINATION Goal: RH STG MANAGE BOWEL W/MEDICATION W/ASSISTANCE Description: STG Manage Bowel with Medication with mod I Assistance. Outcome: Progressing   Problem: RH SKIN INTEGRITY Goal: RH STG MAINTAIN SKIN INTEGRITY WITH ASSISTANCE Description: STG Maintain Skin Integrity With min Assistance. Outcome: Progressing   Problem: RH SAFETY Goal: RH STG ADHERE TO SAFETY PRECAUTIONS W/ASSISTANCE/DEVICE Description: STG Adhere to Safety Precautions With Mod I Assistance/Device. Outcome: Progressing Goal: RH STG DECREASED RISK OF FALL WITH ASSISTANCE Description: STG Decreased Risk of Fall With Mod I Assistance. Outcome: Progressing   Problem: RH PAIN MANAGEMENT Goal: RH STG PAIN MANAGED AT OR BELOW PT'S PAIN GOAL Description: Less than 3 on 0-10 scale Outcome: Progressing

## 2019-08-14 ENCOUNTER — Encounter: Payer: Self-pay | Admitting: *Deleted

## 2019-08-14 ENCOUNTER — Telehealth (HOSPITAL_COMMUNITY): Payer: Self-pay

## 2019-08-14 ENCOUNTER — Other Ambulatory Visit: Payer: Self-pay | Admitting: *Deleted

## 2019-08-14 NOTE — Patient Outreach (Signed)
[facility]) Care Management  08/14/2019  SHILYNN RASCHER 1970/10/12 [id number]  Transition of care call/case closure   Referral received:07/16/19 Initial outreach:08/14/19 Insurance: [place] [employer] Plan    Subjective: Initial successful telephone call to patient's preferred number in order to complete transition of care assessment; 2 HIPAA identifiers verified. Explained purpose of call and completed transition of care assessment.  Davona states that she is doing so much better, she discussed hard work during rehab but it was worth it and determined that she will continue to make good progress,glad to be at home for her birthday on today.   She discussed being able to get up and walk in home with walker and her son or husband for support states she is wearing a AFO brace to left leg  She reports living in a one level home . She reports tolerating diet without any problems. She reports improvement in control of bladder incontinence since starting on medication mybetriq, she has a bedside commode for use at home and plan to use it over the commode due to her commode at home being low.   Spouse/teenage sons are assisting with her  recovery. He has placed patient medication in a pill box and assist her with insulin administration.. Patient discussed monitoring blood sugar twice daily , reading this am was 120 states that is a little higher than in the hospital. Discussed plan for monitoring blood pressure for MD follow up visit, she has an appointment with Dr. Sabra Heck on 12/28 and will discuss readings . She discussed plan to speak with Dr. Sabra Heck about plan for her to wear a 30 day event monitor, she understands reasoning to evaluate for heart rhythms concerns that may contribute to stroke . She discussed being active with [facility] program for diabetes management and is aware that program ended,unsure about next step hasn't been on the live life well website in a while . Explained  [place] Chronic disease telephonic  management program with health coach  for ongoing  health issues she is agreeable to being referred to program. .  She says she does  not have the hospital indemnity, states she did not take out shortterm disability either  she is on  FMLA, through 10/15/19, that will be 90 days, she discussed possibly  looking into long term . She was working from home prior to having a stroke, due to limit in movement with left side unsure about when she will be able to return to work.  She uses  a [facility] at [facility].  She  denies educational needs related to staying safe during the [place] 19 pandemic.    Objective:  Mrs. Nissim  was hospitalized at [facility] from 11/24- 12/23, in the inpatient rehab from 12/1- 08/13/19 for Acute ischemic right middle s/p tenecteplase and mechanical thrombectomy with due to ICA dissection.  cerebral artery stroke hemiparesis affecting left side,  Comorbidities include:Diabetes type 2, chronic pain right knee, hypertension and obesity, carotid artery dissection stent placement .   She was discharged to home on 08/13/19 with Home health physical and occupation therapy, by [facility] , they have contacted her and initial visit on be on Saturday, 12/26. She has home equipment that includes wheelchair with cushion, bedside commode, tub bench, rolling walker.    Assessment:  Patient voices good understanding of all discharge instructions.  See transition of care flowsheet for assessment details.   Plan:  Reviewed hospital discharge diagnosis of Ischemic stroke  left hemiparesis  and discharge treatment plan using hospital discharge instructions, assessing medication adherence, reviewing problems requiring provider notification, and discussing the importance of follow up with surgeon, primary care provider and/or specialists as directed. Reviewed [place] healthy lifestyle program information to keep premiums low for  2022:  Step 1: Get annual physical between August 21, 2018 and February 19, 2020; Step 2: Complete your health assessment between August 22, 2019 and April 21, 2020 at [email or web address] Step 3:Identify your current health status and complete the corresponding action step between January 1, and April 21, 2020.   Using Active Health Management ActiveAdv[employer] View website,placed patient referral for chronic condition management of Diabetes. Patient verifies that she will be able to access [place]  website at [email or web address]    No ongoing care management needs identified so will close case to [place] Management services and route successful outreach letter with [place] Management pamphlet and 24 Hour Nurse Line Magnet to [place] [facility] clinical pool to be mailed to patient's home address. Thanked patient for their services to [facility].   Joylene Draft, RN, [place] Management Coordinator  [phone]- Mobile [phone]- Toll Free Main Office

## 2019-08-14 NOTE — Progress Notes (Signed)
Social Work Discharge Note   The overall goal for the admission was met for:   Discharge location: Yes - home with spouse and teen sons who will provide 24/7 assistance  Length of Stay: Yes - 22 days  Discharge activity level: Yes - supervision to min assist overall  Home/community participation: Yes  Services provided included: MD, RD, PT, OT, SLP, RN, TR, Pharmacy, Neuropsych and SW  Financial Services: Private Insurance: [employer]  Follow-up services arranged: Home Health: PT, OT via [facility], DME: 20x18 lightweight w/c (hemi height) with left 1/2 laptray, cushion, walker, 3n1 commode and tub bench via [place] and Patient/Family has no preference for HH/DME agencies  Comments (or additional information):  Contact info:  Pt @ [phone] or spouse, Maecie Sevcik @ [phone]  Patient/Family verbalized understanding of follow-up arrangements: Yes  Individual responsible for coordination of the follow-up plan: pt  Confirmed correct DME delivered: Clarrisa Kaylor 08/14/2019    Geri Hepler

## 2019-08-14 NOTE — Telephone Encounter (Signed)
Called to schedule 4 wk f/u, no answer, no vm. AW

## 2019-08-19 ENCOUNTER — Telehealth: Payer: Self-pay | Admitting: Registered Nurse

## 2019-08-19 ENCOUNTER — Telehealth: Payer: Self-pay | Admitting: Physical Medicine and Rehabilitation

## 2019-08-19 NOTE — Telephone Encounter (Signed)
Called patient to inform her that cardiology and IR has been trying to reach her to set follow up appointment. She is questioning need for 30 day event monitor--discussed neurology recommendations for full work up as well as the fact that the monitor is very unobtrusive. Gave her telephone # for Dr.Deveshwar and Cardiology office to set follow up. She thinks her VM is full and plans to clear it so she will be able to get messages.

## 2019-08-19 NOTE — Telephone Encounter (Signed)
Transitional Care call  Patient name: Kathryn Coffey  DOB: 12-09-1970 1. Are you/is patient experiencing any problems since coming home? No a. Are there any questions regarding any aspect of care? No 2. Are there any questions regarding medications administration/dosing? No a. Are meds being taken as prescribed? Yes b. "Patient should review meds with caller to confirm" Medication List Reviewed 3. Have there been any falls? No 4. Has Home Health been to the house and/or have they contacted you? Yes: [facility] a. If not, have you tried to contact them? NA b. Can we help you contact them? NA 5. Are bowels and bladder emptying properly? Yes a. Are there any unexpected incontinence issues? No b. If applicable, is patient following bowel/bladder programs? NA 6. Any fevers, problems with breathing, unexpected pain? No 7. Are there any skin problems or new areas of breakdown? No 8. Has the patient/family member arranged specialty MD follow up (ie cardiology/neurology/renal/surgical/etc.)?  [name] has aa HFU appointment with neurology, She was instructed to call [facility] to schedule HFU appointment for cardiac monitor. She verbalizes understanding.  a. Can we help arrange? NA 9. Does the patient need any other services or support that we can help arrange? No 10. Are caregivers following through as expected in assisting the patient? Yes 11. Has the patient quit smoking, drinking alcohol, or using drugs as recommended? [name] states she doesn't smoke or use illicit drugs, prior to hospitalization she would have an occasional drink. She was instructed not to drink at this time, she verbalizes understanding.   Appointment date/time 08/25/2019  arrival time 2:20  appointment time 2:40 with Dr. Ranell Patrick. At. [address]

## 2019-08-25 ENCOUNTER — Other Ambulatory Visit: Payer: Self-pay

## 2019-08-25 ENCOUNTER — Encounter
Payer: No Typology Code available for payment source | Attending: Physical Medicine and Rehabilitation | Admitting: Physical Medicine and Rehabilitation

## 2019-08-25 ENCOUNTER — Encounter: Payer: Self-pay | Admitting: Physical Medicine and Rehabilitation

## 2019-08-25 VITALS — BP 135/87 | HR 93 | Temp 97.7°F | Ht 65.0 in | Wt 251.0 lb

## 2019-08-25 DIAGNOSIS — R519 Headache, unspecified: Secondary | ICD-10-CM | POA: Insufficient documentation

## 2019-08-25 DIAGNOSIS — S46119A Strain of muscle, fascia and tendon of long head of biceps, unspecified arm, initial encounter: Secondary | ICD-10-CM | POA: Diagnosis present

## 2019-08-25 DIAGNOSIS — I6601 Occlusion and stenosis of right middle cerebral artery: Secondary | ICD-10-CM | POA: Diagnosis not present

## 2019-08-25 DIAGNOSIS — R232 Flushing: Secondary | ICD-10-CM | POA: Diagnosis present

## 2019-08-25 DIAGNOSIS — I10 Essential (primary) hypertension: Secondary | ICD-10-CM | POA: Insufficient documentation

## 2019-08-25 MED ORDER — AMPHETAMINE-DEXTROAMPHETAMINE 10 MG PO TABS: 10.0000 mg | ORAL_TABLET | Freq: Two times a day (BID) | ORAL | 0 refills | Status: DC

## 2019-08-25 NOTE — Progress Notes (Signed)
Subjective:    Patient ID: Kathryn Coffey, female    DOB: 08-18-1971, 49 y.o.   MRN: [id number]  HPI  Mrs. Mccreedy presents for follow-up of R MCA stroke after CIR admission.   She continues to have minimal left upper extremity strength. She is able to flex and extend her arm with gravity eliminated. She has purchased a stimulator and applies it to her arm, but at high levels it pains her. She has not completed home PT and OT and would like to transition to outpatient PT and OT, with an emphasis on her left upper arm strength.   She has anxiety at times but denies depression.   She has some pain in her left shoulder but the Tizanidine 4mg  HS helps. She does not need refills. She also has an arm sling that she uses. She was recently downgraded from her walker to a cane.   Her husband plans to return to work in a few days and she will be home with her mom and 44 year old son. 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  She has not been able to do much around the house, though she is ambulating within her home. She has difficulty getting dressed, washing herself, cooking.    Pain Inventory Average Pain 0 Pain Right Now 0 My pain is no pain  In the last 24 hours, has pain interfered with the following? General activity 0 Relation with others 0 Enjoyment of life 0 What TIME of day is your pain at its worst? no pain Sleep (in general) Fair  Pain is worse with: no pain Pain improves with: no pain Relief from Meds: no pain  Mobility walk without assistance use a cane how many minutes can you walk? 10 ability to climb steps?  yes do you drive?  no use a wheelchair needs help with transfers transfers alone Do you have any goals in this area?  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 Breast cancer Maternal Grandmother 48  . Diabetes Mother   . Diabetes Father    Social History   Socioeconomic History  . Marital status: Married    Spouse name: Not on file  . Number of children: Not on file  . Years of education: Not on file  . Highest education level: Not on file  Occupational History  . Not on file  Tobacco Use  . Smoking status: Never Smoker  . Smokeless tobacco: Never Used  Substance and Sexual Activity  . Alcohol use: Yes    Alcohol/week: 0.0 standard drinks    Comment: SOCIAL  . Drug use: No  . Sexual activity: Yes    Birth control/protection: I.U.D., Other-see comments    Comment: BTL  Other Topics Concern  . Not on file  Social History Narrative  . Not on file   Social Determinants of Health   Financial Resource Strain:   . Difficulty of Paying Living Expenses: Not on file  Food Insecurity:   . Worried About [employer] of  Food in the Last Year: Not on file  . Ran Out of Food in the Last Year: Not on file  Transportation Needs:   . Lack of Transportation (Medical): Not on file  . Lack of Transportation (Non-Medical): Not on file  Physical Activity:   . Days of Exercise per Week: Not on file  . Minutes of Exercise per Session: Not on file  Stress:   . Feeling of Stress : Not on file  Social Connections:   . Frequency of Communication with Friends and Family: Not on file  . Frequency of Social Gatherings with Friends and Family: Not on file  . Attends Religious Services: Not on file  . Active Member of Clubs or Organizations: Not on file  . Attends Archivist Meetings: Not on file  . Marital Status: Not on  file   Past Surgical History:  Procedure Laterality Date  . BREAST EXCISIONAL BIOPSY Left    cyst excision - Dr. Patty Sermons  . CESAREAN SECTION    . CHOLECYSTECTOMY    . COLONOSCOPY    . EAR CYST EXCISION Right 07/04/2018   Procedure: EXCISION GLOMUS TUMOR THUMBNAIL;  Surgeon: Corky Mull, MD;  Location: [facility] ORS;  Service: Orthopedics;  Laterality: Right;  . ESOPHAGOGASTRODUODENOSCOPY    . ESOPHAGOGASTRODUODENOSCOPY (EGD) WITH PROPOFOL N/A 12/06/2015   Procedure: ESOPHAGOGASTRODUODENOSCOPY (EGD) WITH PROPOFOL;  Surgeon: Manya Silvas, MD;  Location: [facility] ENDOSCOPY;  Service: Endoscopy;  Laterality: N/A;  . IR ANGIO INTRA EXTRACRAN SEL INTERNAL CAROTID UNI L MOD SED  07/15/2019  . IR ANGIO VERTEBRAL SEL SUBCLAVIAN INNOMINATE UNI R MOD SED  07/15/2019  . IR CT HEAD LTD  07/15/2019  . IR INTRAVSC STENT CERV CAROTID W/O EMB-PROT MOD SED INC ANGIO  07/15/2019  . IR PERCUTANEOUS ART THROMBECTOMY/INFUSION INTRACRANIAL INC DIAG ANGIO  07/15/2019  . LAPAROSCOPIC GASTRIC BANDING    . LAPAROSCOPIC GASTRIC RESTRICTIVE DUODENAL PROCEDURE (DUODENAL SWITCH) N/A 01/25/2016   Procedure: LAPAROSCOPIC GASTRIC RESTRICTIVE DUODENAL PROCEDURE (DUODENAL SWITCH);  Surgeon: Ladora Daniel, MD;  Location: [facility] ORS;  Service: General;  Laterality: N/A;  . OVARY SURGERY     x2  . RADIOLOGY WITH ANESTHESIA N/A 07/15/2019   Procedure: IR WITH ANESTHESIA;  Surgeon: Luanne Bras, MD;  Location: [place];  Service: Radiology;  Laterality: N/A;  . TONSILLECTOMY    . TUBAL LIGATION    . UMBILICAL HERNIA REPAIR N/A 01/25/2016   Procedure: LAPAROSCOPIC UMBILICAL HERNIA;  Surgeon: Ladora Daniel, MD;  Location: [facility] ORS;  Service: General;  Laterality: N/A;  . UMBILICAL HERNIA REPAIR N/A 10/20/2016   Procedure: HERNIA REPAIR UMBILICAL ADULT;  Surgeon: Leonie Green, MD;  Location: [facility] ORS;  Service: General;  Laterality: N/A;   Past Medical History:  Diagnosis Date  . Abdominal wall cellulitis 2013 and 2014    . Asthma    allergic to grasses  . B12 deficiency   . Complication of anesthesia   . Costochondritis   . Diabetes mellitus without complication ([place])   . Gastric anomaly    multiple small ulcers  . GERD (gastroesophageal reflux disease)   . Herpes 06/2018   POSSIBLY IN EYE- PT TAKING VALTREX AND WILL SEE OPTHAMOLOGIST TO SEE IF VALTREX IS WORKING  . History of abnormal cervical Pap smear   . History of Clostridium difficile colitis   . History of hiatal hernia   . History of kidney stones   . Hypertension   . IBS (irritable bowel syndrome)   . Migraine   .  Morbid obesity ([place])    s/p attempted gastric banding now decompressed  . PCOS (polycystic ovarian syndrome)   . PONV (postoperative nausea and vomiting)    WITH SPINAL ONLY  . Stroke [facility])    There were no vitals taken for this visit.  Opioid Risk Score:   Fall Risk Score:  `1  Depression screen PHQ 2/9  Depression screen [facility] 2/9 06/06/2017 10/11/2015  Decreased Interest 1 0  Down, Depressed, Hopeless 0 0  PHQ - 2 Score 1 0    Review of Systems  Constitutional: Negative.   HENT: Negative.   Eyes: Negative.   Respiratory: Negative.   Gastrointestinal: Positive for nausea.  Endocrine: Negative.   Genitourinary: Negative.   Musculoskeletal: Positive for gait problem.  Skin: Negative.   Allergic/Immunologic: Positive for immunocompromised state.  Neurological: Positive for weakness.       Tingling   Hematological: Negative.   Psychiatric/Behavioral: Positive for dysphoric mood. The patient is nervous/anxious.   All other systems reviewed and are negative.      Objective:   Physical Exam Constitutional: No distress . Vital signs reviewed. Sitting up in [place]. HENT: Normocephalic.  Atraumatic. Eyes: EOMI. No discharge. Cardiovascular: No JVD. Respiratory: Normal effort.  No stridor. GI: Non-distended. Skin: Warm and dry.  Intact. Psych: Normal mood.  Normal behavior. Musc: No edema in extremities.  No  tenderness in extremities. Neuro: Alert Left facial droop Motor: RUE/RLE: 5/5 proximal distal LUE: 0/5 proximal distal. LLE: 4--4/5 proximal to distal  No increase in tone appreciated  Assessment & Plan:  Mrs. Earles presents for follow-up of R MCA stroke after CIR admission.   --Provided scripts for outpatient PT and OT to focus on strengthening of left upper extremity and participation in ADLs such as grooming, dressing, and cooking.  --Given patient's fatigue, discussed increasing her Adderall dose to 10mg  BID. She is agreeable. --Discussed prognosis and answered her questions regarding stimulator machine. --Continue salonpas and tizanidine for left shoulder pain. If worsens, can consider adding diclofenac gel.   Thirty minutes of face to face patient care time were spent during this visit. All questions were encouraged and answered. Follow up with me in 2 months.

## 2019-08-30 ENCOUNTER — Other Ambulatory Visit: Payer: Self-pay

## 2019-08-30 DIAGNOSIS — I6601 Occlusion and stenosis of right middle cerebral artery: Secondary | ICD-10-CM

## 2019-09-01 ENCOUNTER — Other Ambulatory Visit: Payer: Self-pay | Admitting: Physical Medicine and Rehabilitation

## 2019-09-01 ENCOUNTER — Other Ambulatory Visit: Payer: Self-pay | Admitting: *Deleted

## 2019-09-01 MED ORDER — BUPROPION HCL 100 MG PO TABS: 100.0000 mg | ORAL_TABLET | Freq: Two times a day (BID) | ORAL | 0 refills | Status: DC

## 2019-09-01 MED ORDER — TRAZODONE HCL 50 MG PO TABS: 50.0000 mg | ORAL_TABLET | Freq: Every day | ORAL | 0 refills | Status: DC

## 2019-09-01 MED ORDER — TIZANIDINE HCL 4 MG PO TABS: 4.0000 mg | ORAL_TABLET | Freq: Every day | ORAL | 0 refills | Status: DC

## 2019-09-01 MED ORDER — MIRABEGRON ER 25 MG PO TB24: 25.0000 mg | ORAL_TABLET | Freq: Every day | ORAL | 0 refills | Status: DC

## 2019-09-01 MED ORDER — TOPIRAMATE 25 MG PO TABS: 25.0000 mg | ORAL_TABLET | Freq: Every day | ORAL | 0 refills | Status: DC

## 2019-09-01 NOTE — Telephone Encounter (Signed)
Please refill everything by Adderall- should be getting from PCP or Psychiatrist, probably was refilled because she couldn't get in immediately.

## 2019-09-08 ENCOUNTER — Encounter: Payer: Self-pay | Admitting: Occupational Therapy

## 2019-09-08 ENCOUNTER — Encounter: Payer: Self-pay | Admitting: Physical Therapy

## 2019-09-08 ENCOUNTER — Ambulatory Visit
Payer: No Typology Code available for payment source | Attending: Physical Medicine and Rehabilitation | Admitting: Occupational Therapy

## 2019-09-08 ENCOUNTER — Other Ambulatory Visit: Payer: Self-pay

## 2019-09-08 ENCOUNTER — Ambulatory Visit: Payer: No Typology Code available for payment source | Admitting: Physical Therapy

## 2019-09-08 DIAGNOSIS — R2689 Other abnormalities of gait and mobility: Secondary | ICD-10-CM

## 2019-09-08 DIAGNOSIS — M6281 Muscle weakness (generalized): Secondary | ICD-10-CM | POA: Insufficient documentation

## 2019-09-08 DIAGNOSIS — I63511 Cerebral infarction due to unspecified occlusion or stenosis of right middle cerebral artery: Secondary | ICD-10-CM

## 2019-09-08 DIAGNOSIS — R262 Difficulty in walking, not elsewhere classified: Secondary | ICD-10-CM

## 2019-09-08 NOTE — Therapy (Signed)
[place] MAIN [facility] SERVICES [address] [place], [place], [address] Phone: [phone]   Fax:  [phone]  Physical Therapy Evaluation  Patient Details  Name: Kathryn Coffey MRN: [id number] Date of Birth: 1971/04/20 No data recorded  Encounter Date: 09/08/2019  PT End of Session - 09/08/19 1113    Visit Number  1    Number of Visits  36    Date for PT Re-Evaluation  11/03/19    PT Start Time  1110    PT Stop Time  1155    PT Time Calculation (min)  45 min    Equipment Utilized During Treatment  Gait belt    Activity Tolerance  Patient tolerated treatment well       Past Medical History:  Diagnosis Date  . Abdominal wall cellulitis 2013 and 2014  . Asthma    allergic to grasses  . B12 deficiency   . Complication of anesthesia   . Costochondritis   . Diabetes mellitus without complication ([place])   . Gastric anomaly    multiple small ulcers  . GERD (gastroesophageal reflux disease)   . Herpes 06/2018   POSSIBLY IN EYE- PT TAKING VALTREX AND WILL SEE OPTHAMOLOGIST TO SEE IF VALTREX IS WORKING  . History of abnormal cervical Pap smear   . History of Clostridium difficile colitis   . History of hiatal hernia   . History of kidney stones   . Hypertension   . IBS (irritable bowel syndrome)   . Migraine   . Morbid obesity ([place])    s/p attempted gastric banding now decompressed  . PCOS (polycystic ovarian syndrome)   . PONV (postoperative nausea and vomiting)    WITH SPINAL ONLY  . Stroke [facility])     Past Surgical History:  Procedure Laterality Date  . BREAST EXCISIONAL BIOPSY Left    cyst excision - Dr. Patty Sermons  . CESAREAN SECTION    . CHOLECYSTECTOMY    . COLONOSCOPY    . EAR CYST EXCISION Right 07/04/2018   Procedure: EXCISION GLOMUS TUMOR THUMBNAIL;  Surgeon: Corky Mull, MD;  Location: [facility] ORS;  Service: Orthopedics;  Laterality: Right;  . ESOPHAGOGASTRODUODENOSCOPY    . ESOPHAGOGASTRODUODENOSCOPY (EGD) WITH  PROPOFOL N/A 12/06/2015   Procedure: ESOPHAGOGASTRODUODENOSCOPY (EGD) WITH PROPOFOL;  Surgeon: Manya Silvas, MD;  Location: [facility] ENDOSCOPY;  Service: Endoscopy;  Laterality: N/A;  . IR ANGIO INTRA EXTRACRAN SEL INTERNAL CAROTID UNI L MOD SED  07/15/2019  . IR ANGIO VERTEBRAL SEL SUBCLAVIAN INNOMINATE UNI R MOD SED  07/15/2019  . IR CT HEAD LTD  07/15/2019  . IR INTRAVSC STENT CERV CAROTID W/O EMB-PROT MOD SED INC ANGIO  07/15/2019  . IR PERCUTANEOUS ART THROMBECTOMY/INFUSION INTRACRANIAL INC DIAG ANGIO  07/15/2019  . LAPAROSCOPIC GASTRIC BANDING    . LAPAROSCOPIC GASTRIC RESTRICTIVE DUODENAL PROCEDURE (DUODENAL SWITCH) N/A 01/25/2016   Procedure: LAPAROSCOPIC GASTRIC RESTRICTIVE DUODENAL PROCEDURE (DUODENAL SWITCH);  Surgeon: Ladora Daniel, MD;  Location: [facility] ORS;  Service: General;  Laterality: N/A;  . OVARY SURGERY     x2  . RADIOLOGY WITH ANESTHESIA N/A 07/15/2019   Procedure: IR WITH ANESTHESIA;  Surgeon: Luanne Bras, MD;  Location: [place];  Service: Radiology;  Laterality: N/A;  . TONSILLECTOMY    . TUBAL LIGATION    . UMBILICAL HERNIA REPAIR N/A 01/25/2016   Procedure: LAPAROSCOPIC UMBILICAL HERNIA;  Surgeon: Ladora Daniel, MD;  Location: [facility] ORS;  Service: General;  Laterality: N/A;  . UMBILICAL HERNIA REPAIR  N/A 10/20/2016   Procedure: HERNIA REPAIR UMBILICAL ADULT;  Surgeon: Leonie Green, MD;  Location: [facility] ORS;  Service: General;  Laterality: N/A;    There were no vitals filed for this visit.   Subjective Assessment - 09/08/19 1226    Subjective  Patient is unsteady and uses a small base quad cane.    Pertinent History  Patient was in the hospital [place] and then in patient rehab 3 weeks. She was dicharged 08/13/19 and then HHPT and was discharged 09/03/19. She has not had any falls.    Currently in Pain?  Yes    Pain Score  2     Pain Location  Arm    Pain Orientation  Left    Pain Descriptors / Indicators  Aching    Pain Type  Chronic pain    Pain  Onset  More than a month ago    Pain Frequency  Constant         [facility] PT Assessment - 09/08/19 1108      Assessment   Medical Diagnosis  CVA    Onset Date/Surgical Date  07/14/20    Hand Dominance  Right      Precautions   Precautions  None      Restrictions   Weight Bearing Restrictions  No      Balance Screen   Has the patient fallen in the past 6 months  No    Has the patient had a decrease in activity level because of a fear of falling?   Yes    Is the patient reluctant to leave their home because of a fear of falling?   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    Leisure  being outside      Cognition   Overall Cognitive Status  Within Functional Limits for tasks assessed      Standardized Balance Assessment   Standardized Balance Assessment  Chief Technology Officer Test   Sit to Stand  Able to stand without using hands and stabilize independently    Standing Unsupported  Able to stand safely 2 minutes    Sitting with Back Unsupported but Feet Supported on Floor or Stool  Able to sit safely and securely 2 minutes    Stand to Sit  Sits safely with minimal use of hands    Transfers  Able to transfer safely, minor use of hands    Standing Unsupported with Eyes Closed  Able to stand 10 seconds safely    Standing Unsupported with Feet Together  Able to place feet together independently and stand  1 minute safely    From Standing, Reach Forward with Outstretched Arm  Can reach confidently >25 cm (10")    From  Standing Position, Pick up Object from Floor  Able to pick up shoe safely and easily    From Standing Position, Turn to Look Behind Over each Shoulder  Looks behind from both sides and weight shifts well    Turn 360 Degrees  Able to turn 360 degrees safely in 4 seconds or less    Standing Unsupported, Alternately Place Feet on Step/Stool  Able to stand independently and safely and complete 8 steps in 20 seconds    Standing Unsupported, One Foot in [employer] balance while stepping or standing    Standing on One Leg  Unable to try or needs assist to prevent fall    Total Score  48        PAIN: L shoulder 0/10-8/10   POSTURE:WFN   PROM/AROM: WFL  STRENGTH:  Graded on a 0-5 scale Muscle Group Left Right                          Hip Flex 3/5 4/5  Hip Abd -3/5 4/5  Hip Add 1/5 3/5  Hip Ext 2+/5 3/5      Knee Flex 3/5 4/5  Knee Ext 3+/5 5/5  Ankle DF 3+5 5/5  Ankle PF 3+/5 5/5   SENSATION: WNL   FUNCTIONAL MOBILITY: Difficulty with rolling left and right and difficulty with spine to sit and sit to supine   BALANCE: Berg balance 48/56  GAIT:  Patient has slow gait speed with small base quad cane, L ankle DF shuffle with increased distance  OUTCOME MEASURES: TEST Outcome Interpretation  5 times sit<>stand 15.28sec >30 yo, >15 sec indicates increased risk for falls  10 meter walk test     .51            m/s <1.0 m/s indicates increased risk for falls; limited community ambulator  Timed up and Go   19.36              sec <14 sec indicates increased risk for falls  6 minute walk test           525     Feet 1000 feet is community Conservator, museum/gallery Assessment 48/56 <36/56 (100% risk for falls), 37-45 (80% risk for falls); 46-51 (>50% risk for falls); 52-55 (lower risk <25% of falls)        Treatment: Heel raises bilaterally with 2 sec hold c 20 Leg  press 60 lbs x 20 reps, L ankle instability  Patient performed with instruction, verbal cues, tactile cues of therapist: goal: increase tissue extensibility, promote proper posture, improve mobility       Objective measurements completed on examination: See above findings.              PT Education - 09/08/19 1112    Education Details  plan of care    Person(s) Educated  Patient    Methods  Explanation    Comprehension  Verbalized understanding       PT Short Term Goals - 09/08/19 1218      PT SHORT TERM GOAL #1   Title  Patient will be independent in home exercise program to improve strength/mobility for better functional independence with ADLs.    Time  6    Period  Weeks    Status  New    Target Date  10/20/19      PT SHORT TERM GOAL #2   Title  Patient (> 15 years old) will  complete five times sit to stand test in < 15 seconds indicating an increased LE strength and improved balance.    Time  6    Period  Weeks    Status  New    Target Date  10/20/19        PT Long Term Goals - 09/08/19 1222      PT LONG TERM GOAL #1   Title  Patient will increase Berg Balance score by > 6 points to demonstrate decreased fall risk during functional activities.    Time  12    Period  Weeks    Status  New    Target Date  12/01/19      PT LONG TERM GOAL #2   Title  Patient will increase six minute walk test distance to >1200 for progression to community ambulator and improve gait ability    Time  12    Period  Weeks    Status  New    Target Date  12/01/19      PT LONG TERM GOAL #3   Title  Patient will increase 10 meter walk test to >1.1m/s as to improve gait speed for better community ambulation and to reduce fall risk    Time  12    Period  Weeks    Status  New    Target Date  12/01/19             Plan - 09/08/19 1114    Clinical Impression Statement  Patient presents with decreased BLE strength, decreased gait speed, decreased standing balance.   Patient's main complaint is pain and inability to participate in desired activities. Patient w ill benefit from skilled therapy in order to increase LE strength and standing balance and increase gait speed and improve her ability to participate in desired activities and decrease her falls risk.   Personal Factors and Comorbidities  Comorbidity 1    Comorbidities  diabetes, high blood pressure, reflux,    Examination-Activity Limitations  Carry;Caring for Others;Bend;Locomotion Level;Self Feeding;Sleep;Toileting    Examination-Participation Restrictions  Cleaning;Community Activity;Driving;Laundry;Shop    Stability/Clinical Decision Making  Stable/Uncomplicated    Clinical Decision Making  Low    Rehab Potential  Good    PT Frequency  2x / week    PT Duration  12 weeks    PT Treatment/Interventions  Manual techniques;Functional mobility training;Stair training;Gait training;Electrical Stimulation;Moist Heat;Ultrasound;Therapeutic exercise;Balance training;Neuromuscular re-education    PT Next Visit Plan  strengthening and balance    PT Home Exercise Plan  standing hip abd,    Consulted and Agree with Plan of Care  Family member/caregiver;Patient       Patient will benefit from skilled therapeutic intervention in order to improve the following deficits and impairments:  Abnormal gait, Decreased knowledge of use of DME, Dizziness, Pain, Decreased coordination, Impaired UE functional use, Decreased strength, Decreased endurance, Decreased activity tolerance, Decreased balance, Difficulty walking, Obesity  Visit Diagnosis: Acute ischemic right middle cerebral artery (MCA) stroke (HCC) - Plan: PT plan of care cert/re-cert  Difficulty in walking, not elsewhere classified - Plan: PT plan of care cert/re-cert  Other abnormalities of gait and mobility - Plan: PT plan of care cert/re-cert  Muscle weakness (generalized) - Plan: PT plan of care cert/re-cert     Problem List Patient Active Problem  List   Diagnosis Date Noted  . E. coli UTI   . Diabetes mellitus type 2 in obese ([place])   . Chronic pain of right knee   . 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 Hemiparesis affecting left side as late effect of stroke ([place])   . Acute ischemic right middle cerebral artery (MCA) stroke ([place]) 07/22/2019  . Carotid artery dissection  (HCC) s/p stent placement 07/21/2019  . Diabetes mellitus type II, uncontrolled ([place]) 07/21/2019  . Acute blood loss anemia 07/21/2019  . Leukocytosis 07/21/2019  . Stroke (cerebrum) ([place]) - R MCA infarct s/p tenecteplase and mechanical thrombectomy w/ d/t ICA dissection  07/15/2019  . Middle cerebral artery embolism, right 07/15/2019  . Controlled type 2 diabetes mellitus without complication, without long-term current use of insulin ([place]) 05/28/2017  . Attention deficit hyperactivity disorder (ADHD), combined type 04/18/2017  . Morbid obesity ([place]) 01/25/2016  . Umbilical hernia [id number]  . Hiatal hernia 12/08/2015  . History of Clostridium difficile colitis 06/19/2014  . HTN (hypertension) 12/23/2013  . PCOS (polycystic ovarian syndrome) 12/23/2013  . Bariatric surgery status 01/28/2013    Alanson Puls, PT DPT 09/08/2019, 2:09 PM  [place] MAIN [facility] SERVICES [address] [place], [place], [address] Phone: [phone]   Fax:  [phone]  Name: Kathryn Coffey MRN: [id number] Date of Birth: [birth year]

## 2019-09-08 NOTE — Therapy (Addendum)
[place] MAIN [facility] SERVICES [address] [place], [place], [address] Phone: [phone]   Fax:  [phone]  Occupational Therapy Evaluation  Patient Details  Name: Kathryn Coffey MRN: [id number] Date of Birth: [birth year] Referring Provider (OT): Raulkar   Encounter Date: 09/08/2019  OT End of Session - 09/08/19 1130    Visit Number  1    Number of Visits  24    Date for OT Re-Evaluation  12/01/19    Authorization Type  Progress report period starting  on 09/08/2019    Authorization Time Period  FOTO on the 6th, 12th, 18th, 24th, 30th, and 36th visit.    OT Start Time  1100    OT Stop Time  1200    OT Time Calculation (min)  60 min    Activity Tolerance  Patient tolerated treatment well    Behavior During Therapy  WFL for tasks assessed/performed       Past Medical History:  Diagnosis Date  . Abdominal wall cellulitis 2013 and 2014  . Asthma    allergic to grasses  . B12 deficiency   . Complication of anesthesia   . Costochondritis   . Diabetes mellitus without complication ([place])   . Gastric anomaly    multiple small ulcers  . GERD (gastroesophageal reflux disease)   . Herpes 06/2018   POSSIBLY IN EYE- PT TAKING VALTREX AND WILL SEE OPTHAMOLOGIST TO SEE IF VALTREX IS WORKING  . History of abnormal cervical Pap smear   . History of Clostridium difficile colitis   . History of hiatal hernia   . History of kidney stones   . Hypertension   . IBS (irritable bowel syndrome)   . Migraine   . Morbid obesity ([place])    s/p attempted gastric banding now decompressed  . PCOS (polycystic ovarian syndrome)   . PONV (postoperative nausea and vomiting)    WITH SPINAL ONLY  . Stroke [facility])     Past Surgical History:  Procedure Laterality Date  . BREAST EXCISIONAL BIOPSY Left    cyst excision - Dr. Patty Sermons  . CESAREAN SECTION    . CHOLECYSTECTOMY    . COLONOSCOPY    . EAR CYST EXCISION Right 07/04/2018   Procedure: EXCISION  GLOMUS TUMOR THUMBNAIL;  Surgeon: Corky Mull, MD;  Location: [facility] ORS;  Service: Orthopedics;  Laterality: Right;  . ESOPHAGOGASTRODUODENOSCOPY    . ESOPHAGOGASTRODUODENOSCOPY (EGD) WITH PROPOFOL N/A 12/06/2015   Procedure: ESOPHAGOGASTRODUODENOSCOPY (EGD) WITH PROPOFOL;  Surgeon: Manya Silvas, MD;  Location: [facility] ENDOSCOPY;  Service: Endoscopy;  Laterality: N/A;  . IR ANGIO INTRA EXTRACRAN SEL INTERNAL CAROTID UNI L MOD SED  07/15/2019  . IR ANGIO VERTEBRAL SEL SUBCLAVIAN INNOMINATE UNI R MOD SED  07/15/2019  . IR CT HEAD LTD  07/15/2019  . IR INTRAVSC STENT CERV CAROTID W/O EMB-PROT MOD SED INC ANGIO  07/15/2019  . IR PERCUTANEOUS ART THROMBECTOMY/INFUSION INTRACRANIAL INC DIAG ANGIO  07/15/2019  . LAPAROSCOPIC GASTRIC BANDING    . LAPAROSCOPIC GASTRIC RESTRICTIVE DUODENAL PROCEDURE (DUODENAL SWITCH) N/A 01/25/2016   Procedure: LAPAROSCOPIC GASTRIC RESTRICTIVE DUODENAL PROCEDURE (DUODENAL SWITCH);  Surgeon: Ladora Daniel, MD;  Location: [facility] ORS;  Service: General;  Laterality: N/A;  . OVARY SURGERY     x2  . RADIOLOGY WITH ANESTHESIA N/A 07/15/2019   Procedure: IR WITH ANESTHESIA;  Surgeon: Luanne Bras, MD;  Location: [place];  Service: Radiology;  Laterality: N/A;  . TONSILLECTOMY    . TUBAL LIGATION    .  UMBILICAL HERNIA REPAIR N/A 01/25/2016   Procedure: LAPAROSCOPIC UMBILICAL HERNIA;  Surgeon: Ladora Daniel, MD;  Location: [facility] ORS;  Service: General;  Laterality: N/A;  . UMBILICAL HERNIA REPAIR N/A 10/20/2016   Procedure: HERNIA REPAIR UMBILICAL ADULT;  Surgeon: Leonie Green, MD;  Location: [facility] ORS;  Service: General;  Laterality: N/A;    There were no vitals filed for this visit.  Subjective Assessment - 09/08/19 1118    Subjective   Pt. was accompanied by her husband.    Pertinent History Per pt. Chart. Pt. is a 49 y.o. female with history of HTN, T2DM, obesity who was admitted on 07/15/19 with onset of HA progressing to flaccid left hemiparesis a few hours  later.  CT head showed acute nonhemorrhagic infarct in right basal ganglia with hyperdense right MCA sign.  CT A/P head neck showed perfusion deficit with occluded right ICA and slow flow in M1.  She underwent cerebral angiogram with revascularization of right MCA with T1C1 revascularization and repair of right ICA with flow diverter device. Stroke felt to be embolic likely due to ICA dissection. Postprocedure head CT was negative for bleed repeat CTA head neck showed reocclusion of right ICA origin and occlusion of right ICA stent but now with patent right MCA. Pt. received inpatient rehab services for 3 weeks, and home health services.    Limitations  LUE AROM    Currently in Pain?  Yes    Pain Score  0-No pain   0/10 pain at inital eval. Pt. reports pain at rest is 0/10, at its worse 8/10.   Pain Location  Shoulder    Pain Orientation  Left    Pain Type  Chronic pain    Pain Onset  More than a month ago    Pain Frequency  Constant        [facility] OT Assessment - 09/08/19 0001      Assessment   Medical Diagnosis  CVA    Referring Provider (OT)  Raulkar    Onset Date/Surgical Date  07/15/19    Hand Dominance  Right      Precautions   Precautions  None      Restrictions   Weight Bearing Restrictions  No      Balance Screen   Has the patient fallen in the past 6 months  Yes    How many times?  1    Has the patient had a decrease in activity level because of a fear of falling?   Yes    Is the patient reluctant to leave their home because of a fear of falling?   Yes      Home  Environment   Family/patient expects to be discharged to:  Private residence    Living Arrangements  Spouse/significant other    Available Help at Discharge  Family    Type of [place]  One level    Bathroom Shower/Tub  Walk-in Shower    Bathroom Toilet  Standard    [place] Accessibility  Yes    How accessible  Other (Comment)   Stage manager - 2  wheels;Cane -quad;Bedside commode;Grab bars - toilet;Grab bars - tub/shower;Hand held shower head;Wheelchair - manual    Lives With  Spouse      Prior Function   Level of Independence  Independent    Vocation  Full time employment    Vocation  Requirements  Hospital Coder    Leisure  Being outside      ADL   Eating/Feeding  Independent   Assist with cutting meat. Pt. uses her right hand.   Grooming  Independent   Uses her right hand   Upper Body Bathing  Min guard    Lower Body Bathing  Min guard    Upper Body Dressing  Min guard    Lower Body Dressing  Minimal assistance    Toilet Transfer  Min guard    Toileting - Clothing Manipulation  Minimal assistance    Toileting -  Hygiene  Minimal assistance      IADL   Prior Level of Function Shopping  --   Independent   Shopping  Needs to be accompanied on any shopping trip    Prior Level of Function Light Housekeeping  Independent    Light Housekeeping  Needs help with all home maintenance tasks    Prior Level of Function Meal Prep  Independent    Meal Prep  Able to complete simple cold meal and snack prep    Prior Level of Function Sales executive  Relies on family or friends for transportation    Prior Level of Function Medication Managment  Independent    Medication Management  Is responsible for taking medication in correct dosages at correct time    Prior Level of Function Loss adjuster, chartered financial matters independently (budgets, writes checks, pays rent, bills goes to bank), collects and keeps track of income      Mobility   Mobility Status  Needs assist    Mobility Status Comments  Uses a quad cane   Uses quad cane     Written Expression   Dominant Hand  Right    Handwriting  100% legible      Vision - History   Baseline Vision  Wears glasses only for reading   Computer   Additional Comments  No change      Sensation   Light  Touch  Appears Intact    Proprioception  Appears Intact      Coordination   Gross Motor Movements are Fluid and Coordinated  No    Fine Motor Movements are Fluid and Coordinated  No      AROM   Overall AROM Comments  Trace  active scapular elevation, elbow flexion, extension, digit flexion. RUE WNL      PROM   Overall PROM Comments  Left shoulder flexion 80 degrees painfree range,  abduction 102. RUE WNL                      OT Education - 09/08/19 1122    Education Details  OT services, POC, LUE ROM, LUE positioning    Person(s) Educated  Patient;Spouse    Methods  Demonstration    Comprehension  Returned demonstration;Verbalized understanding          OT Long Term Goals - 09/08/19 1402      OT LONG TERM GOAL #1   Title  Pt. will increase left shoulder flexion PROM by 20 degrees to assist with UE dressing    Baseline  Eval: Shoulder flexion PROM 80 degrees    Time  12    Period  Weeks    Status  New    Target Date  12/01/19      OT LONG TERM GOAL #2  Title  Pt. will be able to actively stabilize left shoulder for 10 seconds in supine with shoulde flexed to 90 degrees, and elbow in extension in preparation for functional reaching.    Baseline  Eval: Pt.is unable to stabilize left shoulder    Time  12    Period  Weeks    Status  New    Target Date  12/01/19      OT LONG TERM GOAL #3   Title  Pt. will independently perform hand to face patterns with the LUE in preparation for light self grooming tasks.    Baseline  Eval: Pt. is unable to perform hand to face patterns with the LUE.    Time  12    Period  Weeks    Status  New    Target Date  12/01/19      OT LONG TERM GOAL #4   Title  Pt. will initiate 10 degrees of active left wrist extension  in preparation for actively lifting her hand off of a surface.    Baseline  Eval: Pt. is unable    Time  12    Period  Weeks    Status  New    Target Date  12/01/19      OT LONG TERM GOAL #5   Title  Pt.  will initiate left hand digit extension in order to be able to actively release an objects during ADL tasks.    Baseline  Eval: Pt. is unable    Time  12    Period  Weeks    Status  New    Target Date  12/01/19      Long Term Additional Goals   Additional Long Term Goals  Yes      OT LONG TERM GOAL #6   Title  Pt. increase left hand digit flexion in order to be able to hold her toothbrush while applying toothpaste with her left right hand.    Baseline  Eval: Pt. is unable    Time  12    Period  Weeks    Status  New    Target Date  12/01/19      OT LONG TERM GOAL #7   Title  --            Plan - 09/08/19 1124    Clinical Impression Statement  Pt. is a 49 y.o. female who was diagnosed with a CVA on 07/14/2020. Pt. presents with impaired LUE functioning, Pt. presents with trace active initiation for scapular elevation, depression, elbow flexion, extension, and digit flexion. Pt. has left shoulder pain from subluxation. Pt. has a [employer] sling that she wears when standing/walking. Pt. has a left resting  hand splint, and a [employer]. Pt. is unable to engage her nondominant LUE in any ADL, or IADL tasks.  Pt. FOTO score: 28. Pt. will benefit from OT services to improve, and facilitate active volitional movement in order to improve LUE functioning during ADLs, and IDL tasks.    Occupational performance deficits (Please refer to evaluation for details):  ADL's;IADL's    Rehab Potential  Good    Clinical Decision Making  Several treatment options, min-mod task modification necessary    Comorbidities Affecting Occupational Performance:  May have comorbidities impacting occupational performance    Modification or Assistance to Complete Evaluation   Min-Moderate modification of tasks or assist with assess necessary to complete eval    OT Frequency  2x / week    OT  Duration  12 weeks    OT Treatment/Interventions  Self-care/ADL training;Moist Heat;DME and/or AE instruction;Neuromuscular  education;Therapeutic exercise;Therapeutic activities;Energy conservation;Patient/family education    Consulted and Agree with Plan of Care  Patient       Patient will benefit from skilled therapeutic intervention in order to improve the following deficits and impairments:           Visit Diagnosis: Muscle weakness (generalized)    Problem List Patient Active Problem List   Diagnosis Date Noted  . E. coli UTI   . Diabetes mellitus type 2 in obese ([place])   . Chronic pain of right knee   . 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 Hemiparesis affecting left side as late effect of stroke ([place])   . Acute ischemic right middle cerebral artery (MCA) stroke ([place]) 07/22/2019  . Carotid artery dissection  (H[facility]) s/p stent placement 07/21/2019  . Diabetes mellitus type II, uncontrolled ([place]) 07/21/2019  . Acute blood loss anemia 07/21/2019  . Leukocytosis 07/21/2019  . Stroke (cerebrum) ([place]) - R MCA infarct s/p tenecteplase and mechanical thrombectomy w/ d/t ICA dissection  07/15/2019  . Middle cerebral artery embolism, right 07/15/2019  . Controlled type 2 diabetes mellitus without complication, without long-term current use of insulin ([place]) 05/28/2017  . Attention deficit hyperactivity disorder (ADHD), combined type 04/18/2017  . Morbid obesity ([place]) 01/25/2016  . Umbilical hernia [id number]  . Hiatal hernia 12/08/2015  . History of Clostridium difficile colitis 06/19/2014  . HTN (hypertension) 12/23/2013  . PCOS (polycystic ovarian syndrome) 12/23/2013  . Bariatric surgery status 01/28/2013    Harrel Carina, MS, OTR/L 09/08/2019, 2:34 PM  [place] MAIN [facility] SERVICES [address] [place], [place], [address] Phone: [phone]   Fax:  [phone]  Name: Kathryn Coffey MRN: [id number] Date of Birth: 1971/01/15

## 2019-09-08 NOTE — Addendum Note (Signed)
Addended by: Lucia Bitter on: 09/08/2019 02:46 PM   Modules accepted: Orders

## 2019-09-10 ENCOUNTER — Other Ambulatory Visit: Payer: Self-pay

## 2019-09-10 ENCOUNTER — Ambulatory Visit (HOSPITAL_COMMUNITY)
Admission: RE | Admit: 2019-09-10 | Discharge: 2019-09-10 | Disposition: A | Discharge status: Home / Self Care | Payer: No Typology Code available for payment source | Source: Ambulatory Visit | Attending: Physician Assistant | Admitting: Physician Assistant

## 2019-09-10 DIAGNOSIS — I639 Cerebral infarction, unspecified: Secondary | ICD-10-CM

## 2019-09-10 NOTE — Progress Notes (Signed)
Chief Complaint: Patient was seen in consultation today for proximal right ICA occlusion/dissection s/p revascularization.  Referring Physician(s): Code Stroke- Greta Doom  Supervising Physician: Luanne Bras  Patient Status: [facility] - Out-pt  History of Present Illness: Kathryn Coffey is a 49 y.o. female with a past medical history as below, with pertinent past medical history including CVA 06/2019, migraines, asthma, GERD/hiatal hernia, gastric ulcers, and morbid obesity. She is known to [facility] and has been followed by Dr. Estanislado Pandy since 06/2019. She first presented to our department as an active code stroke at the request of Dr. Leonel Ramsay. She underwent an image-guided cerebral arteriogram with emergent mechanical thrombectomy of right MCA occlusion achieving a TICI 3 revascularization, along with revascularization of acutely occluded proximal right ICA secondary to dissection using a pipeline flex flow diverter 07/15/2019 by Dr. Estanislado Pandy. She was discharged to [facility] 07/22/2019, and discharged home 08/13/2019.  Patient presents today for follow-up regarding her recent procedure 07/15/2019. Patient awake and alert sitting in chair. Accompanied by husband. Complains of left-sided weakness, LUE>LLE. States that this has improved since discharge. States that she is seeing PT/OT for this. Denies headache, numbness/tingling, dizziness, vision changes, hearing changes, tinnitus, or speech difficulty.  Currently taking Brilinta 90 mg twice daily and Aspirin 81 mg once daily.   Past Medical History:  Diagnosis Date  . Abdominal wall cellulitis 2013 and 2014  . Asthma    allergic to grasses  . B12 deficiency   . Complication of anesthesia   . Costochondritis   . Diabetes mellitus without complication ([place])   . Gastric anomaly    multiple small ulcers  . GERD (gastroesophageal reflux disease)   . Herpes 06/2018   POSSIBLY IN EYE- PT TAKING VALTREX AND WILL SEE  OPTHAMOLOGIST TO SEE IF VALTREX IS WORKING  . History of abnormal cervical Pap smear   . History of Clostridium difficile colitis   . History of hiatal hernia   . History of kidney stones   . Hypertension   . IBS (irritable bowel syndrome)   . Migraine   . Morbid obesity ([place])    s/p attempted gastric banding now decompressed  . PCOS (polycystic ovarian syndrome)   . PONV (postoperative nausea and vomiting)    WITH SPINAL ONLY  . Stroke [facility])     Past Surgical History:  Procedure Laterality Date  . BREAST EXCISIONAL BIOPSY Left    cyst excision - Dr. Patty Sermons  . CESAREAN SECTION    . CHOLECYSTECTOMY    . COLONOSCOPY    . EAR CYST EXCISION Right 07/04/2018   Procedure: EXCISION GLOMUS TUMOR THUMBNAIL;  Surgeon: Corky Mull, MD;  Location: [facility] ORS;  Service: Orthopedics;  Laterality: Right;  . ESOPHAGOGASTRODUODENOSCOPY    . ESOPHAGOGASTRODUODENOSCOPY (EGD) WITH PROPOFOL N/A 12/06/2015   Procedure: ESOPHAGOGASTRODUODENOSCOPY (EGD) WITH PROPOFOL;  Surgeon: Manya Silvas, MD;  Location: [facility] ENDOSCOPY;  Service: Endoscopy;  Laterality: N/A;  . IR ANGIO INTRA EXTRACRAN SEL INTERNAL CAROTID UNI L MOD SED  07/15/2019  . IR ANGIO VERTEBRAL SEL SUBCLAVIAN INNOMINATE UNI R MOD SED  07/15/2019  . IR CT HEAD LTD  07/15/2019  . IR INTRAVSC STENT CERV CAROTID W/O EMB-PROT MOD SED INC ANGIO  07/15/2019  . IR PERCUTANEOUS ART THROMBECTOMY/INFUSION INTRACRANIAL INC DIAG ANGIO  07/15/2019  . LAPAROSCOPIC GASTRIC BANDING    . LAPAROSCOPIC GASTRIC RESTRICTIVE DUODENAL PROCEDURE (DUODENAL SWITCH) N/A 01/25/2016   Procedure: LAPAROSCOPIC GASTRIC RESTRICTIVE DUODENAL PROCEDURE (DUODENAL SWITCH);  Surgeon: Ladora Daniel, MD;  Location: [facility] ORS;  Service: General;  Laterality: N/A;  . OVARY SURGERY     x2  . RADIOLOGY WITH ANESTHESIA N/A 07/15/2019   Procedure: IR WITH ANESTHESIA;  Surgeon: Luanne Bras, MD;  Location: [place];  Service: Radiology;  Laterality: N/A;  . TONSILLECTOMY    .  TUBAL LIGATION    . UMBILICAL HERNIA REPAIR N/A 01/25/2016   Procedure: LAPAROSCOPIC UMBILICAL HERNIA;  Surgeon: Ladora Daniel, MD;  Location: [facility] ORS;  Service: General;  Laterality: N/A;  . UMBILICAL HERNIA REPAIR N/A 10/20/2016   Procedure: HERNIA REPAIR UMBILICAL ADULT;  Surgeon: Leonie Green, MD;  Location: [facility] ORS;  Service: General;  Laterality: N/A;    Allergies: Azithromycin, Ivp dye [iodinated diagnostic agents], Lexapro [escitalopram], and Vicodin [hydrocodone-acetaminophen]  Medications: Prior to Admission medications   Medication Sig Start Date End Date Taking? Authorizing Provider  acetaminophen (TYLENOL) 325 MG tablet Take 1-2 tablets (325-650 mg total) by mouth every 4 (four) hours as needed for mild pain. 07/23/19   Love, Ivan Anchors, PA-C  albuterol (PROVENTIL HFA;VENTOLIN HFA) 108 (90 Base) MCG/ACT inhaler Inhale 2 puffs into the lungs every 6 (six) hours as needed for wheezing or shortness of breath.    [provider]  amphetamine-dextroamphetamine (ADDERALL) 10 MG tablet Take 1 tablet (10 mg total) by mouth 2 (two) times daily with a meal. 08/25/19   Raulkar, Clide Deutscher, MD  aspirin 81 MG chewable tablet Chew 1 tablet (81 mg total) by mouth daily. 07/23/19   Donzetta Starch, NP  buPROPion (WELLBUTRIN) 100 MG tablet Take 1 tablet (100 mg total) by mouth 2 (two) times daily. 09/01/19   Raulkar, Clide Deutscher, MD  Calcium Carb-Cholecalciferol (CALCIUM + VITAMIN D3 PO) Take 1 tablet by mouth daily.    [provider]  ferrous sulfate 325 (65 FE) MG tablet Take 1 tablet (325 mg total) by mouth daily with breakfast. 08/13/19   Love, Ivan Anchors, PA-C  fexofenadine (ALLEGRA) 180 MG tablet Take 180 mg by mouth daily as needed for allergies.     [provider]  fluticasone (FLONASE) 50 MCG/ACT nasal spray Place 2 sprays into both nostrils daily as needed for allergies or rhinitis.     [provider]  liraglutide (VICTOZA) 18 MG/3ML SOPN Inject 0.2 mLs  (1.2 mg total) into the skin daily. 08/12/19   Love, Ivan Anchors, PA-C  Melatonin 3 MG TABS Take 2 tablets (6 mg total) by mouth at bedtime. 07/22/19   Donzetta Starch, NP  Menthol-Methyl Salicylate (MUSCLE RUB) 10-15 % CREA Apply 1 application topically 2 (two) times daily as needed for muscle pain. 08/12/19   Love, Ivan Anchors, PA-C  metFORMIN (GLUCOPHAGE) 500 MG tablet Take 500 mg by mouth 2 (two) times daily. 06/20/18   [provider]  mirabegron ER (MYRBETRIQ) 25 MG TB24 tablet Take 1 tablet (25 mg total) by mouth daily. 09/01/19   Raulkar, Clide Deutscher, MD  Multiple Vitamins-Minerals (MULTIVITAMIN PO) Take 2 tablets by mouth daily.    [provider]  NON FORMULARY 1 Dose once a week. Wednesdays    [provider]  pantoprazole (PROTONIX) 40 MG tablet Take 1 tablet (40 mg total) by mouth at bedtime. 08/12/19   Love, Ivan Anchors, PA-C  ticagrelor (BRILINTA) 90 MG TABS tablet Take 1 tablet (90 mg total) by mouth 2 (two) times daily. 08/12/19   Love, Ivan Anchors, PA-C  tiZANidine (ZANAFLEX) 4 MG tablet Take 1 tablet (4 mg total) by mouth at bedtime.  09/01/19   Raulkar, Clide Deutscher, MD  topiramate (TOPAMAX) 25 MG tablet Take 1 tablet (25 mg total) by mouth at bedtime. 09/01/19   Raulkar, Clide Deutscher, MD  traZODone (DESYREL) 50 MG tablet Take 1 tablet (50 mg total) by mouth at bedtime. 09/01/19   Raulkar, Clide Deutscher, MD     Family History  Problem Relation Age of Onset  . Breast cancer Maternal Grandmother 22  . Diabetes Mother   . Diabetes Father     Social History   Socioeconomic History  . Marital status: Married    Spouse name: Not on file  . Number of children: Not on file  . Years of education: Not on file  . Highest education level: Not on file  Occupational History  . Not on file  Tobacco Use  . Smoking status: Never Smoker  . Smokeless tobacco: Never Used  Substance and Sexual Activity  . Alcohol use: Yes    Alcohol/week: 0.0 standard drinks    Comment: SOCIAL  .  Drug use: No  . Sexual activity: Yes    Birth control/protection: I.U.D., Other-see comments    Comment: BTL  Other Topics Concern  . Not on file  Social History Narrative  . Not on file   Social Determinants of Health   Financial Resource Strain:   . Difficulty of Paying Living Expenses: Not on file  Food Insecurity:   . Worried About Charity fundraiser in the Last Year: Not on file  . Ran Out of Food in the Last Year: Not on file  Transportation Needs:   . Lack of Transportation (Medical): Not on file  . Lack of Transportation (Non-Medical): Not on file  Physical Activity:   . Days of Exercise per Week: Not on file  . Minutes of Exercise per Session: Not on file  Stress:   . Feeling of Stress : Not on file  Social Connections:   . Frequency of Communication with Friends and Family: Not on file  . Frequency of Social Gatherings with Friends and Family: Not on file  . Attends Religious Services: Not on file  . Active Member of Clubs or Organizations: Not on file  . Attends Archivist Meetings: Not on file  . Marital Status: Not on file     Review of Systems: A 12 point ROS discussed and pertinent positives are indicated in the HPI above.  All other systems are negative.  Review of Systems  Constitutional: Negative for chills and fever.  HENT: Negative for hearing loss and tinnitus.   Eyes: Negative for visual disturbance.  Respiratory: Negative for shortness of breath and wheezing.   Cardiovascular: Negative for chest pain and palpitations.  Neurological: Positive for weakness. Negative for dizziness, speech difficulty, numbness and headaches.  Psychiatric/Behavioral: Negative for behavioral problems and confusion.    Vital Signs: There were no vitals taken for this visit.  Physical Exam Constitutional:      General: She is not in acute distress.    Appearance: Normal appearance.  Pulmonary:     Effort: Pulmonary effort is normal. No respiratory  distress.  Musculoskeletal:     Comments: Can spontaneously move right side and LLE; can slightly bend LUE at elbow.  Skin:    General: Skin is warm and dry.  Neurological:     Mental Status: She is alert and oriented to person, place, and time.  Psychiatric:        Mood and Affect: Mood normal.  Behavior: Behavior normal.      Imaging: No results found.  Labs:  CBC: Recent Labs    07/23/19 0521 07/28/19 0626 08/04/19 0603 08/11/19 0600  WBC 11.9* 11.0* 11.4* 7.3  HGB 9.0* 9.3* 9.7* 9.5*  HCT 29.5* 30.6* 32.2* 31.9*  PLT 483* 571* 508* 422*    COAGS: Recent Labs    07/15/19 1225  INR QUESTIONABLE RESULTS, RECOMMEND RECOLLECT TO VERIFY  APTT QUESTIONABLE RESULTS, RECOMMEND RECOLLECT TO VERIFY    BMP: Recent Labs    07/23/19 0521 07/28/19 0626 08/04/19 0603 08/11/19 0600  NA 136 139 140 139  K 3.6 3.6 3.9 3.6  CL 102 106 107 106  CO2 24 22 22 23   GLUCOSE 144* 102* 105* 115*  BUN 14 16 13 15   CALCIUM 8.7* 8.9 9.2 9.1  CREATININE 0.52 0.64 0.72 0.63  GFRNONAA >60 >60 >60 >60  GFRAA >60 >60 >60 >60    LIVER FUNCTION TESTS: Recent Labs    07/15/19 1225 07/23/19 0521  BILITOT 0.7 0.6  AST 58* 22  ALT 50* 27  ALKPHOS 115 97  PROT 7.0 6.1*  ALBUMIN 3.8 2.9*     Assessment and Plan:  Acute CVA s/p cerebral arteriogram with emergent mechanical thrombectomy of right MCA occlusion achieving a TICI 3 revascularization, along with revascularization of acutely occluded proximal right ICA secondary to dissection using a pipeline flex flow diverter 07/15/2019 by Dr. Estanislado Pandy. Dr. Estanislado Pandy was present for consultation.  Discussed ADLs post-stroke. Patient states that she needs assistance with showering, cutting food, and dressing. Recommend that patient follow-up with PT/OT regularly.   Discussed proximal right ICA stent. Explained that the best course of management for her stent at this time is continued conservative management, including  continuing DAPT and routine imaging scans to monitor for changes. Plan for follow-up with a carotid US 6 months from procedure 07/15/2019. Informed patient that our schedulers will call her to set up this imaging scan. Instructed patient to continue taking Brilinta 90 mg twice daily and Aspirin 81 mg once daily.  Discussed secondary stroke prevention. Recommend that patient follow-up with her PCP regularly regarding diabetes mellitus management. Recommend that patient follow-up with neurology regularly.  All questions answered and concerns addressed. Patient and husband convey understanding and agree with plan.  Thank you for this interesting consult.  I greatly enjoyed meeting Kathryn Coffey and look forward to participating in their care.  A copy of this report was sent to the requesting provider on this date.  Electronically Signed: Earley Abide, PA-C 09/10/2019, 8:24 AM   I spent a total of 40 Minutes in face to face in clinical consultation, greater than 50% of which was counseling/coordinating care for proximal right ICA occlusion/dissection s/p revascularization.

## 2019-09-11 ENCOUNTER — Ambulatory Visit: Payer: No Typology Code available for payment source

## 2019-09-11 VITALS — BP 141/96 | HR 78

## 2019-09-11 DIAGNOSIS — I63511 Cerebral infarction due to unspecified occlusion or stenosis of right middle cerebral artery: Secondary | ICD-10-CM

## 2019-09-11 DIAGNOSIS — R262 Difficulty in walking, not elsewhere classified: Secondary | ICD-10-CM

## 2019-09-11 DIAGNOSIS — M6281 Muscle weakness (generalized): Secondary | ICD-10-CM | POA: Diagnosis not present

## 2019-09-11 NOTE — Therapy (Signed)
[place] MAIN [facility] SERVICES [address] [place], [place], [address] Phone: [phone]   Fax:  [phone]  Physical Therapy Treatment  Patient Details  Name: Kathryn Coffey MRN: [id number] Date of Birth: 1970-09-20 No data recorded  Encounter Date: 09/11/2019  PT End of Session - 09/11/19 1732    Visit Number  2    Number of Visits  36    Date for PT Re-Evaluation  11/03/19    PT Start Time  1430    PT Stop Time  1515    PT Time Calculation (min)  45 min    Equipment Utilized During Treatment  Gait belt    Activity Tolerance  Patient tolerated treatment well    Behavior During Therapy  WFL for tasks assessed/performed       Past Medical History:  Diagnosis Date  . Abdominal wall cellulitis 2013 and 2014  . Asthma    allergic to grasses  . B12 deficiency   . Complication of anesthesia   . Costochondritis   . Diabetes mellitus without complication ([place])   . Gastric anomaly    multiple small ulcers  . GERD (gastroesophageal reflux disease)   . Herpes 06/2018   POSSIBLY IN EYE- PT TAKING VALTREX AND WILL SEE OPTHAMOLOGIST TO SEE IF VALTREX IS WORKING  . History of abnormal cervical Pap smear   . History of Clostridium difficile colitis   . History of hiatal hernia   . History of kidney stones   . Hypertension   . IBS (irritable bowel syndrome)   . Migraine   . Morbid obesity ([place])    s/p attempted gastric banding now decompressed  . PCOS (polycystic ovarian syndrome)   . PONV (postoperative nausea and vomiting)    WITH SPINAL ONLY  . Stroke [facility])     Past Surgical History:  Procedure Laterality Date  . BREAST EXCISIONAL BIOPSY Left    cyst excision - Dr. Patty Sermons  . CESAREAN SECTION    . CHOLECYSTECTOMY    . COLONOSCOPY    . EAR CYST EXCISION Right 07/04/2018   Procedure: EXCISION GLOMUS TUMOR THUMBNAIL;  Surgeon: Corky Mull, MD;  Location: [facility] ORS;  Service: Orthopedics;  Laterality: Right;  .  ESOPHAGOGASTRODUODENOSCOPY    . ESOPHAGOGASTRODUODENOSCOPY (EGD) WITH PROPOFOL N/A 12/06/2015   Procedure: ESOPHAGOGASTRODUODENOSCOPY (EGD) WITH PROPOFOL;  Surgeon: Manya Silvas, MD;  Location: [facility] ENDOSCOPY;  Service: Endoscopy;  Laterality: N/A;  . IR ANGIO INTRA EXTRACRAN SEL INTERNAL CAROTID UNI L MOD SED  07/15/2019  . IR ANGIO VERTEBRAL SEL SUBCLAVIAN INNOMINATE UNI R MOD SED  07/15/2019  . IR CT HEAD LTD  07/15/2019  . IR INTRAVSC STENT CERV CAROTID W/O EMB-PROT MOD SED INC ANGIO  07/15/2019  . IR PERCUTANEOUS ART THROMBECTOMY/INFUSION INTRACRANIAL INC DIAG ANGIO  07/15/2019  . LAPAROSCOPIC GASTRIC BANDING    . LAPAROSCOPIC GASTRIC RESTRICTIVE DUODENAL PROCEDURE (DUODENAL SWITCH) N/A 01/25/2016   Procedure: LAPAROSCOPIC GASTRIC RESTRICTIVE DUODENAL PROCEDURE (DUODENAL SWITCH);  Surgeon: Ladora Daniel, MD;  Location: [facility] ORS;  Service: General;  Laterality: N/A;  . OVARY SURGERY     x2  . RADIOLOGY WITH ANESTHESIA N/A 07/15/2019   Procedure: IR WITH ANESTHESIA;  Surgeon: Luanne Bras, MD;  Location: [place];  Service: Radiology;  Laterality: N/A;  . TONSILLECTOMY    . TUBAL LIGATION    . UMBILICAL HERNIA REPAIR N/A 01/25/2016   Procedure: LAPAROSCOPIC UMBILICAL HERNIA;  Surgeon: Ladora Daniel, MD;  Location: [facility] ORS;  Service: General;  Laterality: N/A;  . UMBILICAL HERNIA REPAIR N/A 10/20/2016   Procedure: HERNIA REPAIR UMBILICAL ADULT;  Surgeon: Leonie Green, MD;  Location: [facility] ORS;  Service: General;  Laterality: N/A;    Vitals:   09/11/19 1635  BP: (!) 141/96  Pulse: 78  SpO2: 98%    Subjective Assessment - 09/11/19 1629    Subjective  Pt is doing well today. She continues to report L anterior shoulder/bicep pain but not at rest upon arrival. No specific questions or concerns currently.    Pertinent History  Patient was in the hospital [place] and then in patient rehab 3 weeks. She was dicharged 08/13/19 and then HHPT and was discharged 09/03/19. She has  not had any falls.    Currently in Pain?  No/denies    Pain Onset  --          TREATMENT   Ther-ex  NuStep L2 x 5 minutes for warm-up during history (4 minutes unbilled); Supine L SLR 2 x 10; Supine L straight leg abduction and adduction with manual resistance from therapist 2 x 10 each; Hooklying marches 2 x 10; Hooklying clams with manual resistance from therapist 2 x 10; Hooklying adductor squeeze with manual resistance from therapist 2 x 10; Hooklying bridges with therapist stabilizing L knee to prevent IR/ER 2 x 10; Supine manually resisted L leg press 2 x 10; Sit to stand from elevated mat table without UE support and RLE on 6" step to bias WB through LLE 2 x 10; Seated LLE LAQ with manual resistance from therapist x 10; Standing heel/toe raises with handheld support from therapist x 10 each direction; Verbally reviewed HEP with patient;   Pt educated throughout session about proper posture and technique with exercises. Improved exercise technique, movement at target joints, use of target muscles after min to mod verbal, visual, tactile cues.    Pt demonstrates excellent motivation during therapy session today. She is able to complete all supine, hooklying, and seated strengthening exercises with therapist. She does demonstrate some hip flexor substitution with supine hip abduction requiring tactile assist to correct. Verbally reviewed patient's HEP which is very comprehensive. Pt encouraged to continue her home program and follow-up as scheduled. Pt has a NMES unit for her LUE and advised her to bring it with her to next session so we could assess whether it could be used for L dorsiflexor strengthening as well. Pt will benefit from PT services to address deficits in strength, balance, and mobility in order to return to full function at home.                          PT Short Term Goals - 09/08/19 1218      PT SHORT TERM GOAL #1   Title  Patient will  be independent in home exercise program to improve strength/mobility for better functional independence with ADLs.    Time  6    Period  Weeks    Status  New    Target Date  10/20/19      PT SHORT TERM GOAL #2   Title  Patient (> 26 years old) will complete five times sit to stand test in < 15 seconds indicating an increased LE strength and improved balance.    Time  6    Period  Weeks    Status  New    Target Date  10/20/19        PT  Long Term Goals - 09/08/19 1222      PT LONG TERM GOAL #1   Title  Patient will increase Berg Balance score by > 6 points to demonstrate decreased fall risk during functional activities.    Time  12    Period  Weeks    Status  New    Target Date  12/01/19      PT LONG TERM GOAL #2   Title  Patient will increase six minute walk test distance to >1200 for progression to community ambulator and improve gait ability    Time  12    Period  Weeks    Status  New    Target Date  12/01/19      PT LONG TERM GOAL #3   Title  Patient will increase 10 meter walk test to >1.42m/s as to improve gait speed for better community ambulation and to reduce fall risk    Time  12    Period  Weeks    Status  New    Target Date  12/01/19            Plan - 09/11/19 1731    Clinical Impression Statement  Pt demonstrates excellent motivation during therapy session today. She is able to complete all supine, hooklying, and seated strengthening exercises with therapist. She does demonstrate some hip flexor substitution with supine hip abduction requiring tactile assist to correct. Verbally reviewed patient's HEP which is very comprehensive. Pt encouraged to continue her home program and follow-up as scheduled. Pt has a NMES unit for her LUE and advised her to bring it with her to next session so we could assess whether it could be used for L dorsiflexor strengthening as well. Pt will benefit from PT services to address deficits in strength, balance, and mobility in order  to return to full function at home.    Personal Factors and Comorbidities  Comorbidity 1    Comorbidities  diabetes, high blood pressure, reflux,    Examination-Activity Limitations  Carry;Caring for Others;Bend;Locomotion Level;Self Feeding;Sleep;Toileting    Examination-Participation Restrictions  Cleaning;Community Activity;Driving;Laundry;Shop    Stability/Clinical Decision Making  Stable/Uncomplicated    Rehab Potential  Good    PT Frequency  2x / week    PT Duration  12 weeks    PT Treatment/Interventions  Manual techniques;Functional mobility training;Stair training;Gait training;Electrical Stimulation;Moist Heat;Ultrasound;Therapeutic exercise;Balance training;Neuromuscular re-education    PT Next Visit Plan  strengthening and balance    PT Home Exercise Plan  standing hip abd,    Consulted and Agree with Plan of Care  Family member/caregiver;Patient       Patient will benefit from skilled therapeutic intervention in order to improve the following deficits and impairments:  Abnormal gait, Decreased knowledge of use of DME, Dizziness, Pain, Decreased coordination, Impaired UE functional use, Decreased strength, Decreased endurance, Decreased activity tolerance, Decreased balance, Difficulty walking, Obesity  Visit Diagnosis: Acute ischemic right middle cerebral artery (MCA) stroke (HCC)  Difficulty in walking, not elsewhere classified  Muscle weakness (generalized)     Problem List Patient Active Problem List   Diagnosis Date Noted  . E. coli UTI   . Diabetes mellitus type 2 in obese ([place])   . Chronic pain of right knee   . 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 Hemiparesis affecting left side as late effect of stroke ([place])   . Acute ischemic right middle cerebral artery (MCA) stroke ([place]) 07/22/2019  . Carotid artery dissection  (HCC) s/p stent placement  07/21/2019  . Diabetes mellitus type II, uncontrolled ([place]) 07/21/2019  . Acute blood loss anemia 07/21/2019   . Leukocytosis 07/21/2019  . Stroke (cerebrum) ([place]) - R MCA infarct s/p tenecteplase and mechanical thrombectomy w/ d/t ICA dissection  07/15/2019  . Middle cerebral artery embolism, right 07/15/2019  . Controlled type 2 diabetes mellitus without complication, without long-term current use of insulin ([place]) 05/28/2017  . Attention deficit hyperactivity disorder (ADHD), combined type 04/18/2017  . Morbid obesity ([place]) 01/25/2016  . Umbilical hernia [id number]  . Hiatal hernia 12/08/2015  . History of Clostridium difficile colitis 06/19/2014  . HTN (hypertension) 12/23/2013  . PCOS (polycystic ovarian syndrome) 12/23/2013  . Bariatric surgery status 01/28/2013   Phillips Grout PT, DPT, GCS  Yanuel Tagg 09/11/2019, 5:34 PM  [place] MAIN [facility] SERVICES [address] [place], [place], [address] Phone: [phone]   Fax:  [phone]  Name: ARCILIA AEBERSOLD MRN: [id number] Date of Birth: [birth year]

## 2019-09-12 ENCOUNTER — Ambulatory Visit
Admission: RE | Admit: 2019-09-12 | Discharge: 2019-09-12 | Disposition: A | Discharge status: Home / Self Care | Payer: No Typology Code available for payment source | Source: Ambulatory Visit | Attending: Internal Medicine | Admitting: Internal Medicine

## 2019-09-12 DIAGNOSIS — Z1231 Encounter for screening mammogram for malignant neoplasm of breast: Secondary | ICD-10-CM | POA: Insufficient documentation

## 2019-09-15 ENCOUNTER — Encounter: Payer: Self-pay | Admitting: Cardiology

## 2019-09-15 ENCOUNTER — Ambulatory Visit (INDEPENDENT_AMBULATORY_CARE_PROVIDER_SITE_OTHER): Payer: No Typology Code available for payment source | Admitting: Cardiology

## 2019-09-15 ENCOUNTER — Other Ambulatory Visit: Payer: Self-pay

## 2019-09-15 ENCOUNTER — Ambulatory Visit (INDEPENDENT_AMBULATORY_CARE_PROVIDER_SITE_OTHER): Payer: No Typology Code available for payment source

## 2019-09-15 VITALS — BP 138/88 | HR 87 | Ht 65.0 in | Wt 241.8 lb

## 2019-09-15 DIAGNOSIS — I639 Cerebral infarction, unspecified: Secondary | ICD-10-CM

## 2019-09-15 NOTE — Patient Instructions (Signed)
Medication Instructions:  Your physician recommends that you continue on your current medications as directed. Please refer to the Current Medication list given to you today.  *If you need a refill on your cardiac medications before your next appointment, please call your pharmacy*  Lab Work: none If you have labs (blood work) drawn today and your tests are completely normal, you will receive your results only by: Marland Kitchen MyChart Message (if you have MyChart) OR . A paper copy in the mail If you have any lab test that is abnormal or we need to change your treatment, we will call you to review the results.  Testing/Procedures: Your physician has recommended that you wear an 14 day ZIO monitor. Event monitors are medical devices that record the heart's electrical activity. Doctors most often [place] these monitors to diagnose arrhythmias. Arrhythmias are problems with the speed or rhythm of the heartbeat. The monitor is a small, portable device. You can wear one while you do your normal daily activities. This is usually used to diagnose what is causing palpitations/syncope (passing out). A Zio Patch Event Heart monitor will be applied to your chest today.  You will wear the patch for 14 days. After 24 hours, you may shower with the heart monitor on. If you feel any symptoms, you may press and release the button in the middle of the monitor.  Follow-Up: At [facility], you and your health needs are our priority.  As part of our continuing mission to provide you with exceptional heart care, we have created designated Provider Care Teams.  These Care Teams include your primary Cardiologist (physician) and Advanced Practice Providers (APPs -  Physician Assistants and Nurse Practitioners) who all work together to provide you with the care you need, when you need it.  Your next appointment:   1 month(s)  The format for your next appointment:   In Person  Provider:    You may see Kate Sable, MD  or one of the following Advanced Practice Providers on your designated Care Team:    Murray Hodgkins, NP  Christell Faith, PA-C  Marrianne Mood, PA-C

## 2019-09-15 NOTE — Progress Notes (Signed)
Cardiology Office Note:    Date:  09/15/2019   ID:  Kathryn Coffey, DOB Jan 30, 1971, MRN [id number]  PCP:  Rusty Aus, MD  Cardiologist:  Kate Sable, MD  Electrophysiologist:  None   Referring MD: Bary Leriche, PA-C   Chief Complaint  Patient presents with  . New Patient (Initial Visit)    Hospital F/U; Meds verbally reviewed with patient.    History of Present Illness:    Kathryn Coffey is a 49 y.o. female with a hx of diabetes, hypertension, obesity, CVA who presents due to recent CVA.  Patient was admitted to the hospital/[place] for headaches 2 months ago.  Headaches progressed to flaccid left hemiparesis a few hours later.  Head CT showed acute nonhemorrhagic infarct in the right basal ganglia with hyperdense right MCA suggesting thrombus.  Patient diagnosed with occluded right middle cerebral artery M1 segment and underwent an emergent large vessel occlusion thrombolysis.  Stroke was deemed likely cardioembolic.  Lower extremity Doppler was negative for DVT.   Echocardiogram showed normal ejection fraction with restrictive diastolic function.  Doppler evidence of ASD noted.  Patient has since been working with physical therapy on discharge.  Still has difficulty lifting left arm, able to walk with walker with slight weakness on left leg.  Past Medical History:  Diagnosis Date  . Abdominal wall cellulitis 2013 and 2014  . Asthma    allergic to grasses  . B12 deficiency   . Complication of anesthesia   . Costochondritis   . Diabetes mellitus without complication ([place])   . Gastric anomaly    multiple small ulcers  . GERD (gastroesophageal reflux disease)   . Herpes 06/2018   POSSIBLY IN EYE- PT TAKING VALTREX AND WILL SEE OPTHAMOLOGIST TO SEE IF VALTREX IS WORKING  . History of abnormal cervical Pap smear   . History of Clostridium difficile colitis   . History of hiatal hernia   . History of kidney stones   . Hypertension   . IBS (irritable bowel  syndrome)   . Migraine   . Morbid obesity ([place])    s/p attempted gastric banding now decompressed  . PCOS (polycystic ovarian syndrome)   . PONV (postoperative nausea and vomiting)    WITH SPINAL ONLY  . Stroke [facility])     Past Surgical History:  Procedure Laterality Date  . BREAST EXCISIONAL BIOPSY Left    cyst excision - Dr. Patty Sermons  . CESAREAN SECTION    . CHOLECYSTECTOMY    . COLONOSCOPY    . EAR CYST EXCISION Right 07/04/2018   Procedure: EXCISION GLOMUS TUMOR THUMBNAIL;  Surgeon: Corky Mull, MD;  Location: [facility] ORS;  Service: Orthopedics;  Laterality: Right;  . ESOPHAGOGASTRODUODENOSCOPY    . ESOPHAGOGASTRODUODENOSCOPY (EGD) WITH PROPOFOL N/A 12/06/2015   Procedure: ESOPHAGOGASTRODUODENOSCOPY (EGD) WITH PROPOFOL;  Surgeon: Manya Silvas, MD;  Location: [facility] ENDOSCOPY;  Service: Endoscopy;  Laterality: N/A;  . IR ANGIO INTRA EXTRACRAN SEL INTERNAL CAROTID UNI L MOD SED  07/15/2019  . IR ANGIO VERTEBRAL SEL SUBCLAVIAN INNOMINATE UNI R MOD SED  07/15/2019  . IR CT HEAD LTD  07/15/2019  . IR INTRAVSC STENT CERV CAROTID W/O EMB-PROT MOD SED INC ANGIO  07/15/2019  . IR PERCUTANEOUS ART THROMBECTOMY/INFUSION INTRACRANIAL INC DIAG ANGIO  07/15/2019  . LAPAROSCOPIC GASTRIC BANDING    . LAPAROSCOPIC GASTRIC RESTRICTIVE DUODENAL PROCEDURE (DUODENAL SWITCH) N/A 01/25/2016   Procedure: LAPAROSCOPIC GASTRIC RESTRICTIVE DUODENAL PROCEDURE (DUODENAL SWITCH);  Surgeon: Ladora Daniel, MD;  Location: [facility] ORS;  Service: General;  Laterality: N/A;  . OVARY SURGERY     x2  . RADIOLOGY WITH ANESTHESIA N/A 07/15/2019   Procedure: IR WITH ANESTHESIA;  Surgeon: Luanne Bras, MD;  Location: [place];  Service: Radiology;  Laterality: N/A;  . TONSILLECTOMY    . TUBAL LIGATION    . UMBILICAL HERNIA REPAIR N/A 01/25/2016   Procedure: LAPAROSCOPIC UMBILICAL HERNIA;  Surgeon: Ladora Daniel, MD;  Location: [facility] ORS;  Service: General;  Laterality: N/A;  . UMBILICAL HERNIA REPAIR N/A 10/20/2016    Procedure: HERNIA REPAIR UMBILICAL ADULT;  Surgeon: Leonie Green, MD;  Location: [facility] ORS;  Service: General;  Laterality: N/A;    Current Medications: Current Meds  Medication Sig  . albuterol (PROVENTIL HFA;VENTOLIN HFA) 108 (90 Base) MCG/ACT inhaler Inhale 2 puffs into the lungs every 6 (six) hours as needed for wheezing or shortness of breath.  . amphetamine-dextroamphetamine (ADDERALL) 10 MG tablet Take 1 tablet (10 mg total) by mouth 2 (two) times daily with a meal.  . aspirin 81 MG chewable tablet Chew 1 tablet (81 mg total) by mouth daily.  Marland Kitchen buPROPion (WELLBUTRIN) 100 MG tablet Take 1 tablet (100 mg total) by mouth 2 (two) times daily.  . Calcium Carb-Cholecalciferol (CALCIUM + VITAMIN D3 PO) Take 1 tablet by mouth daily.  . ferrous sulfate 325 (65 FE) MG tablet Take 1 tablet (325 mg total) by mouth daily with breakfast.  . fexofenadine (ALLEGRA) 180 MG tablet Take 180 mg by mouth daily as needed for allergies.   . fluticasone (FLONASE) 50 MCG/ACT nasal spray Place 2 sprays into both nostrils daily as needed for allergies or rhinitis.   Marland Kitchen liraglutide (VICTOZA) 18 MG/3ML SOPN Inject 0.2 mLs (1.2 mg total) into the skin daily.  . Menthol-Methyl Salicylate (MUSCLE RUB) 10-15 % CREA Apply 1 application topically 2 (two) times daily as needed for muscle pain.  . metFORMIN (GLUCOPHAGE) 500 MG tablet Take 500 mg by mouth 2 (two) times daily.  . mirabegron ER (MYRBETRIQ) 25 MG TB24 tablet Take 1 tablet (25 mg total) by mouth daily.  . Multiple Vitamins-Minerals (MULTIVITAMIN PO) Take 2 tablets by mouth daily.  . NON FORMULARY 1 Dose once a week. Wednesdays  . omeprazole (PRILOSEC) 40 MG capsule Take 40 mg by mouth daily. Dose unknown  . ticagrelor (BRILINTA) 90 MG TABS tablet Take 1 tablet (90 mg total) by mouth 2 (two) times daily.  Marland Kitchen tiZANidine (ZANAFLEX) 4 MG tablet Take 1 tablet (4 mg total) by mouth at bedtime.  . topiramate (TOPAMAX) 25 MG tablet Take 1 tablet (25 mg total) by  mouth at bedtime.     Allergies:   Azithromycin, Ivp dye [iodinated diagnostic agents], Lexapro [escitalopram], and Vicodin [hydrocodone-acetaminophen]   Social History   Socioeconomic History  . Marital status: Married    Spouse name: Not on file  . Number of children: Not on file  . Years of education: Not on file  . Highest education level: Not on file  Occupational History  . Not on file  Tobacco Use  . Smoking status: Never Smoker  . Smokeless tobacco: Never Used  Substance and Sexual Activity  . Alcohol use: Yes    Alcohol/week: 0.0 standard drinks    Comment: SOCIAL  . Drug use: No  . Sexual activity: Yes    Birth control/protection: I.U.D., Other-see comments    Comment: BTL  Other Topics Concern  . Not on file  Social History Narrative  . Not  on file   Social Determinants of Health   Financial Resource Strain:   . Difficulty of Paying Living Expenses: Not on file  Food Insecurity:   . Worried About Charity fundraiser in the Last Year: Not on file  . Ran Out of Food in the Last Year: Not on file  Transportation Needs:   . Lack of Transportation (Medical): Not on file  . Lack of Transportation (Non-Medical): Not on file  Physical Activity:   . Days of Exercise per Week: Not on file  . Minutes of Exercise per Session: Not on file  Stress:   . Feeling of Stress : Not on file  Social Connections:   . Frequency of Communication with Friends and Family: Not on file  . Frequency of Social Gatherings with Friends and Family: Not on file  . Attends Religious Services: Not on file  . Active Member of Clubs or Organizations: Not on file  . Attends Archivist Meetings: Not on file  . Marital Status: Not on file     Family History: The patient's family history includes Breast cancer (age of onset: 66) in her maternal grandmother; Diabetes in her father and mother; Heart attack in her father.  ROS:   Please see the history of present illness.     All  other systems reviewed and are negative.  EKGs/Labs/Other Studies Reviewed:    The following studies were reviewed today:   EKG:  EKG is  ordered today.  The ekg ordered today demonstrates normal sinus rhythm, cannot rule out anterior infarct.  Recent Labs: 07/23/2019: ALT 27 08/11/2019: BUN 15; Creatinine, Ser 0.63; Hemoglobin 9.5; Platelets 422; Potassium 3.6; Sodium 139  Recent Lipid Panel    Component Value Date/Time   CHOL 129 07/16/2019 0450   TRIG 309 (H) 07/16/2019 0450   TRIG 321 (H) 07/16/2019 0450   HDL 26 (L) 07/16/2019 0450   CHOLHDL 5.0 07/16/2019 0450   VLDL 62 (H) 07/16/2019 0450   LDLCALC 41 07/16/2019 0450    Physical Exam:    VS:  BP 138/88 (BP Location: Right Arm, Patient Position: Sitting, Cuff Size: Normal)   Pulse 87   Ht 5\' 5"  (1.651 m)   Wt 241 lb 12 oz (109.7 kg)   SpO2 97%   BMI 40.23 kg/m     Wt Readings from Last 3 Encounters:  09/15/19 241 lb 12 oz (109.7 kg)  08/25/19 251 lb (113.9 kg)  08/13/19 251 lb 15.8 oz (114.3 kg)     GEN:  Well nourished, well developed in no acute distress, obese HEENT: Normal NECK: No JVD; No carotid bruits LYMPHATICS: No lymphadenopathy CARDIAC: RRR, no murmurs, rubs, gallops RESPIRATORY:  Clear to auscultation without rales, wheezing or rhonchi  ABDOMEN: Soft, non-tender, non-distended MUSCULOSKELETAL:  No edema; left arm weakness noted SKIN: Warm and dry NEUROLOGIC:  Alert and oriented x 3 PSYCHIATRIC:  Normal affect   ASSESSMENT:    1. Cerebrovascular accident (CVA), unspecified mechanism ([place])   2. Morbid obesity ([place])    PLAN:    In order of problems listed above:  1. Patient with right MCA infarct 2 months ago.  Echocardiogram with normal ejection fraction and restrictive filling pattern on report.  EKG so far with no A. fib or flutter.  Will get a cardiac monitor to evaluate presence of atrial fibrillation.  Patient takes aspirin 81 mg daily.  Cholesterol levels shows normal total cholesterol  and low LDL at 41.  Triglycerides level elevated and  patient is being managed by primary care provider.  Repeat lipid panel scheduled for 2 months.  She states losing roughly 20 pounds since stroke. 2. Morbid obesity.  Weight loss advised.  Follow-up after cardiac monitoring 1 month.  This note was generated in part or whole with voice recognition software. Voice recognition is usually quite accurate but there are transcription errors that can and very often do occur. I apologize for any typographical errors that were not detected and corrected.  Medication Adjustments/Labs and Tests Ordered: Current medicines are reviewed at length with the patient today.  Concerns regarding medicines are outlined above.  Orders Placed This Encounter  Procedures  . LONG TERM MONITOR (3-14 DAYS)  . EKG 12-Lead   No orders of the defined types were placed in this encounter.   Patient Instructions  Medication Instructions:  Your physician recommends that you continue on your current medications as directed. Please refer to the Current Medication list given to you today.  *If you need a refill on your cardiac medications before your next appointment, please call your pharmacy*  Lab Work: none If you have labs (blood work) drawn today and your tests are completely normal, you will receive your results only by: Marland Kitchen MyChart Message (if you have MyChart) OR . A paper copy in the mail If you have any lab test that is abnormal or we need to change your treatment, we will call you to review the results.  Testing/Procedures: Your physician has recommended that you wear an 14 day ZIO monitor. Event monitors are medical devices that record the heart's electrical activity. Doctors most often [place] these monitors to diagnose arrhythmias. Arrhythmias are problems with the speed or rhythm of the heartbeat. The monitor is a small, portable device. You can wear one while you do your normal daily activities. This is usually  used to diagnose what is causing palpitations/syncope (passing out). A Zio Patch Event Heart monitor will be applied to your chest today.  You will wear the patch for 14 days. After 24 hours, you may shower with the heart monitor on. If you feel any symptoms, you may press and release the button in the middle of the monitor.  Follow-Up: At [facility], you and your health needs are our priority.  As part of our continuing mission to provide you with exceptional heart care, we have created designated Provider Care Teams.  These Care Teams include your primary Cardiologist (physician) and Advanced Practice Providers (APPs -  Physician Assistants and Nurse Practitioners) who all work together to provide you with the care you need, when you need it.  Your next appointment:   1 month(s)  The format for your next appointment:   In Person  Provider:    You may see Kate Sable, MD or one of the following Advanced Practice Providers on your designated Care Team:    Murray Hodgkins, NP  Christell Faith, PA-C  Marrianne Mood, PA-C      Signed, Kate Sable, MD  09/15/2019 4:59 PM    [place]

## 2019-09-17 ENCOUNTER — Ambulatory Visit: Payer: No Typology Code available for payment source

## 2019-09-17 ENCOUNTER — Other Ambulatory Visit: Payer: Self-pay

## 2019-09-17 ENCOUNTER — Ambulatory Visit: Payer: No Typology Code available for payment source | Admitting: Occupational Therapy

## 2019-09-17 DIAGNOSIS — M6281 Muscle weakness (generalized): Secondary | ICD-10-CM | POA: Diagnosis not present

## 2019-09-17 DIAGNOSIS — R262 Difficulty in walking, not elsewhere classified: Secondary | ICD-10-CM

## 2019-09-17 DIAGNOSIS — R2689 Other abnormalities of gait and mobility: Secondary | ICD-10-CM

## 2019-09-17 NOTE — Therapy (Addendum)
[place] MAIN [facility] SERVICES [address] [place], [place], [address] Phone: [phone]   Fax:  [phone]  Occupational Therapy Note  Patient Details  Name: Kathryn Coffey MRN: [id number] Date of Birth: [birth year] Referring Provider (OT): Raulkar   Encounter Date: 09/17/2019   Pt. arrived for PT, however left prior to OT session secondary to 10/10 LUE pain. Pt. plans to be assessed by her MD tomorrow. Will see pt. once cleared to return for OT services for UE ther. Ex     Past Medical History:  Diagnosis Date  . Abdominal wall cellulitis 2013 and 2014  . Asthma    allergic to grasses  . B12 deficiency   . Complication of anesthesia   . Costochondritis   . Diabetes mellitus without complication ([place])   . Gastric anomaly    multiple small ulcers  . GERD (gastroesophageal reflux disease)   . Herpes 06/2018   POSSIBLY IN EYE- PT TAKING VALTREX AND WILL SEE OPTHAMOLOGIST TO SEE IF VALTREX IS WORKING  . History of abnormal cervical Pap smear   . History of Clostridium difficile colitis   . History of hiatal hernia   . History of kidney stones   . Hypertension   . IBS (irritable bowel syndrome)   . Migraine   . Morbid obesity ([place])    s/p attempted gastric banding now decompressed  . PCOS (polycystic ovarian syndrome)   . PONV (postoperative nausea and vomiting)    WITH SPINAL ONLY  . Stroke [facility])     Past Surgical History:  Procedure Laterality Date  . BREAST EXCISIONAL BIOPSY Left    cyst excision - Dr. Patty Sermons  . CESAREAN SECTION    . CHOLECYSTECTOMY    . COLONOSCOPY    . EAR CYST EXCISION Right 07/04/2018   Procedure: EXCISION GLOMUS TUMOR THUMBNAIL;  Surgeon: Corky Mull, MD;  Location: [facility] ORS;  Service: Orthopedics;  Laterality: Right;  . ESOPHAGOGASTRODUODENOSCOPY    . ESOPHAGOGASTRODUODENOSCOPY (EGD) WITH PROPOFOL N/A 12/06/2015   Procedure: ESOPHAGOGASTRODUODENOSCOPY (EGD) WITH PROPOFOL;  Surgeon: Manya Silvas, MD;  Location: [facility] ENDOSCOPY;  Service: Endoscopy;  Laterality: N/A;  . IR ANGIO INTRA EXTRACRAN SEL INTERNAL CAROTID UNI L MOD SED  07/15/2019  . IR ANGIO VERTEBRAL SEL SUBCLAVIAN INNOMINATE UNI R MOD SED  07/15/2019  . IR CT HEAD LTD  07/15/2019  . IR INTRAVSC STENT CERV CAROTID W/O EMB-PROT MOD SED INC ANGIO  07/15/2019  . IR PERCUTANEOUS ART THROMBECTOMY/INFUSION INTRACRANIAL INC DIAG ANGIO  07/15/2019  . LAPAROSCOPIC GASTRIC BANDING    . LAPAROSCOPIC GASTRIC RESTRICTIVE DUODENAL PROCEDURE (DUODENAL SWITCH) N/A 01/25/2016   Procedure: LAPAROSCOPIC GASTRIC RESTRICTIVE DUODENAL PROCEDURE (DUODENAL SWITCH);  Surgeon: Ladora Daniel, MD;  Location: [facility] ORS;  Service: General;  Laterality: N/A;  . OVARY SURGERY     x2  . RADIOLOGY WITH ANESTHESIA N/A 07/15/2019   Procedure: IR WITH ANESTHESIA;  Surgeon: Luanne Bras, MD;  Location: [place];  Service: Radiology;  Laterality: N/A;  . TONSILLECTOMY    . TUBAL LIGATION    . UMBILICAL HERNIA REPAIR N/A 01/25/2016   Procedure: LAPAROSCOPIC UMBILICAL HERNIA;  Surgeon: Ladora Daniel, MD;  Location: [facility] ORS;  Service: General;  Laterality: N/A;  . UMBILICAL HERNIA REPAIR N/A 10/20/2016   Procedure: HERNIA REPAIR UMBILICAL ADULT;  Surgeon: Leonie Green, MD;  Location: [facility] ORS;  Service: General;  Laterality: N/A;    There were no vitals filed for this visit.  OT Long Term Goals - 09/08/19 1402      OT LONG TERM GOAL #1   Title  Pt. will increase left shoulder flexion PROM by 20 degrees to assist with UE dressing    Baseline  Eval: Shoulder flexion PROM 80 degrees    Time  12    Period  Weeks    Status  New    Target Date  12/01/19      OT LONG TERM GOAL #2   Title  Pt. will be able to actively stabilize left shoulder for 10 seconds in supine with shoulde flexed to 90 degrees, and elbow in extension in preparation for functional reaching.    Baseline  Eval: Pt.is unable  to stabilize left shoulder    Time  12    Period  Weeks    Status  New    Target Date  12/01/19      OT LONG TERM GOAL #3   Title  Pt. will independently perform hand to face patterns with the LUE in preparation for light self grooming tasks.    Baseline  Eval: Pt. is unable to perform hand to face patterns with the LUE.    Time  12    Period  Weeks    Status  New    Target Date  12/01/19      OT LONG TERM GOAL #4   Title  Pt. will initiate 10 degrees of active left wrist extension  in preparation for actively lifting her hand off of a surface.    Baseline  Eval: Pt. is unable    Time  12    Period  Weeks    Status  New    Target Date  12/01/19      OT LONG TERM GOAL #5   Title  Pt. will initiate left hand digit extension in order to be able to actively release an objects during ADL tasks.    Baseline  Eval: Pt. is unable    Time  12    Period  Weeks    Status  New    Target Date  12/01/19      Long Term Additional Goals   Additional Long Term Goals  Yes      OT LONG TERM GOAL #6   Title  Pt. increase left hand digit flexion in order to be able to hold her toothbrush while applying toothpaste with her left right hand.    Baseline  Eval: Pt. is unable    Time  12    Period  Weeks    Status  New    Target Date  12/01/19      OT LONG TERM GOAL #7   Title  --            Plan - 09/17/19 1637    Clinical Impression Statement  Pt. arrived for PT, however left prior to OT session secondary to 10/10 LUE pain. Pt. plans to be assessed by her MD tomorrow. Will see pt. once cleared to return for UE ther. ex.       Patient will benefit from skilled therapeutic intervention in order to improve the following deficits and impairments:           Visit Diagnosis: Muscle weakness (generalized)    Problem List Patient Active Problem List   Diagnosis Date Noted  . E. coli UTI   . Diabetes mellitus type 2 in obese ([place])   . Chronic pain of right knee   .  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 Hemiparesis affecting left side as late effect of stroke ([place])   . Acute ischemic right middle cerebral artery (MCA) stroke ([place]) 07/22/2019  . Carotid artery dissection  (HCC) s/p stent placement 07/21/2019  . Diabetes mellitus type II, uncontrolled ([place]) 07/21/2019  . Acute blood loss anemia 07/21/2019  . Leukocytosis 07/21/2019  . Stroke (cerebrum) ([place]) - R MCA infarct s/p tenecteplase and mechanical thrombectomy w/ d/t ICA dissection  07/15/2019  . Middle cerebral artery embolism, right 07/15/2019  . Controlled type 2 diabetes mellitus without complication, without long-term current use of insulin ([place]) 05/28/2017  . Attention deficit hyperactivity disorder (ADHD), combined type 04/18/2017  . Morbid obesity ([place]) 01/25/2016  . Umbilical hernia [id number]  . Hiatal hernia 12/08/2015  . History of Clostridium difficile colitis 06/19/2014  . HTN (hypertension) 12/23/2013  . PCOS (polycystic ovarian syndrome) 12/23/2013  . Bariatric surgery status 01/28/2013    Harrel Carina, MS, OTR/L 09/17/2019, 4:41 PM  [place] MAIN [facility] SERVICES [address] [place], [place], [address] Phone: [phone]   Fax:  [phone]  Name: Kathryn Coffey MRN: [id number] Date of Birth: 1970/09/09

## 2019-09-17 NOTE — Therapy (Signed)
[place] MAIN [facility] SERVICES [address] [place], [place], [address] Phone: [phone]   Fax:  [phone]  Physical Therapy Treatment  Patient Details  Name: Kathryn Coffey MRN: [id number] Date of Birth: June 10, 1971 No data recorded  Encounter Date: 09/17/2019  PT End of Session - 09/17/19 1709    Visit Number  3    Number of Visits  36    Date for PT Re-Evaluation  11/03/19    PT Start Time  1350    PT Stop Time  1430    PT Time Calculation (min)  40 min    Equipment Utilized During Treatment  Gait belt    Activity Tolerance  Patient tolerated treatment well    Behavior During Therapy  WFL for tasks assessed/performed       Past Medical History:  Diagnosis Date  . Abdominal wall cellulitis 2013 and 2014  . Asthma    allergic to grasses  . B12 deficiency   . Complication of anesthesia   . Costochondritis   . Diabetes mellitus without complication ([place])   . Gastric anomaly    multiple small ulcers  . GERD (gastroesophageal reflux disease)   . Herpes 06/2018   POSSIBLY IN EYE- PT TAKING VALTREX AND WILL SEE OPTHAMOLOGIST TO SEE IF VALTREX IS WORKING  . History of abnormal cervical Pap smear   . History of Clostridium difficile colitis   . History of hiatal hernia   . History of kidney stones   . Hypertension   . IBS (irritable bowel syndrome)   . Migraine   . Morbid obesity ([place])    s/p attempted gastric banding now decompressed  . PCOS (polycystic ovarian syndrome)   . PONV (postoperative nausea and vomiting)    WITH SPINAL ONLY  . Stroke [facility])     Past Surgical History:  Procedure Laterality Date  . BREAST EXCISIONAL BIOPSY Left    cyst excision - Dr. Patty Sermons  . CESAREAN SECTION    . CHOLECYSTECTOMY    . COLONOSCOPY    . EAR CYST EXCISION Right 07/04/2018   Procedure: EXCISION GLOMUS TUMOR THUMBNAIL;  Surgeon: Corky Mull, MD;  Location: [facility] ORS;  Service: Orthopedics;  Laterality: Right;  .  ESOPHAGOGASTRODUODENOSCOPY    . ESOPHAGOGASTRODUODENOSCOPY (EGD) WITH PROPOFOL N/A 12/06/2015   Procedure: ESOPHAGOGASTRODUODENOSCOPY (EGD) WITH PROPOFOL;  Surgeon: Manya Silvas, MD;  Location: [facility] ENDOSCOPY;  Service: Endoscopy;  Laterality: N/A;  . IR ANGIO INTRA EXTRACRAN SEL INTERNAL CAROTID UNI L MOD SED  07/15/2019  . IR ANGIO VERTEBRAL SEL SUBCLAVIAN INNOMINATE UNI R MOD SED  07/15/2019  . IR CT HEAD LTD  07/15/2019  . IR INTRAVSC STENT CERV CAROTID W/O EMB-PROT MOD SED INC ANGIO  07/15/2019  . IR PERCUTANEOUS ART THROMBECTOMY/INFUSION INTRACRANIAL INC DIAG ANGIO  07/15/2019  . LAPAROSCOPIC GASTRIC BANDING    . LAPAROSCOPIC GASTRIC RESTRICTIVE DUODENAL PROCEDURE (DUODENAL SWITCH) N/A 01/25/2016   Procedure: LAPAROSCOPIC GASTRIC RESTRICTIVE DUODENAL PROCEDURE (DUODENAL SWITCH);  Surgeon: Ladora Daniel, MD;  Location: [facility] ORS;  Service: General;  Laterality: N/A;  . OVARY SURGERY     x2  . RADIOLOGY WITH ANESTHESIA N/A 07/15/2019   Procedure: IR WITH ANESTHESIA;  Surgeon: Luanne Bras, MD;  Location: [place];  Service: Radiology;  Laterality: N/A;  . TONSILLECTOMY    . TUBAL LIGATION    . UMBILICAL HERNIA REPAIR N/A 01/25/2016   Procedure: LAPAROSCOPIC UMBILICAL HERNIA;  Surgeon: Ladora Daniel, MD;  Location: [facility] ORS;  Service: General;  Laterality: N/A;  . UMBILICAL HERNIA REPAIR N/A 10/20/2016   Procedure: HERNIA REPAIR UMBILICAL ADULT;  Surgeon: Leonie Green, MD;  Location: [facility] ORS;  Service: General;  Laterality: N/A;    There were no vitals filed for this visit.  Subjective Assessment - 09/17/19 1412    Subjective  Pt is doing alright today but is having severe L shoulder/bicep pain. She reports her pain is a 10/10 upon arrival. She called her MD and has an appointment tomorrow to evaluate.The pain is better when her LUE is supported and she has been using her gait belt as a makeshift sling.    Pertinent History  Patient was in the hospital [place] and then in  patient rehab 3 weeks. She was dicharged 08/13/19 and then HHPT and was discharged 09/03/19. She has not had any falls.    Currently in Pain?  Yes    Pain Score  10-Worst pain ever    Pain Location  Shoulder    Pain Orientation  Left    Pain Descriptors / Indicators  Sore    Pain Type  Acute pain    Pain Onset  More than a month ago             TREATMENT   Ther-ex  Limited evaluation of L shoulder/bicep pain. Pt is tender to palpation to L anterior shoulder and bicep. She has a large palpable mass along the mid belly of her L biceps. No redness, warmth, or edema noted. No distal swelling in LUE noted. Pt denies any prior swelling in wrist/hand. She demonstrates no active elbow flexion with fully pronated or neutral forearm position. She is able to perform some AROM L elbow flexion in full supination/palm up position. Pt provided a sling to support her LUE for comfort. Unfortunately the only sling available is an XL with is much too large but pt reports significant relief with her LUE supported in the sling; Reviewed SAEBO NMES unit with patient and educated patient about placement on anterior tibialis/peroneus longus and brevis to work on L ankle dorsilfexion/eversion strengthening. Pt reports that she has a manual at home with diagrams of placements; Supine L SLR 2 x 10; Supine L straight leg abduction and adduction with manual resistance from therapist 2 x 10 each; Hooklying clams with manual resistance from therapist 2 x 10; Hooklying adductor squeeze with manual resistance from therapist 2 x 10; Hooklying bridges with therapist stabilizing L knee to prevent IR/ER 2 x 10; Supine manually resisted L leg press 2 x 10; Sit to stand from elevated mat table without UE support and RLE on 6" step to bias WB through LLE 2 x 10, table height lowered for second set;   Pt educated throughout session about proper posture and technique with exercises. Improved exercise technique, movement at  target joints, use of target muscles after min to mod verbal, visual, tactile cues.    Pt demonstrates excellent motivation during therapy session today. She arrives complaining of 10/10 L shoulder pain. Limited evaluation of L shoulder/bicep pain. Pt is tender to palpation to L anterior shoulder and bicep. She has a large palpable mass along the mid belly of her L biceps. No redness, warmth, or edema noted. No distal swelling in LUE noted. Pt denies any prior swelling in wrist/hand. She demonstrates no active elbow flexion with fully pronated or neutral forearm position. She is able to perform some AROM L elbow flexion in full supination/palm up position. Pt provided a  sling to support her LUE for comfort. Unfortunately the only sling available is an XL with is much too large but pt reports significant relief with her LUE supported in the sling. Pt also provided education today about placement of SAEBO NMES unit on LLE for ankle dorsiflexion/eversion strengthening. She is able to complete all supine/hooklying exercises as instructed today. Pt encouraged to follow-up with her MD tomorrow regarding her severe L shoulder pain. Pt will benefit from PT services to address deficits in strength, balance, and mobility in order to return to full function at home.                      PT Short Term Goals - 09/08/19 1218      PT SHORT TERM GOAL #1   Title  Patient will be independent in home exercise program to improve strength/mobility for better functional independence with ADLs.    Time  6    Period  Weeks    Status  New    Target Date  10/20/19      PT SHORT TERM GOAL #2   Title  Patient (> 92 years old) will complete five times sit to stand test in < 15 seconds indicating an increased LE strength and improved balance.    Time  6    Period  Weeks    Status  New    Target Date  10/20/19        PT Long Term Goals - 09/08/19 1222      PT LONG TERM GOAL #1   Title  Patient will  increase Berg Balance score by > 6 points to demonstrate decreased fall risk during functional activities.    Time  12    Period  Weeks    Status  New    Target Date  12/01/19      PT LONG TERM GOAL #2   Title  Patient will increase six minute walk test distance to >1200 for progression to community ambulator and improve gait ability    Time  12    Period  Weeks    Status  New    Target Date  12/01/19      PT LONG TERM GOAL #3   Title  Patient will increase 10 meter walk test to >1.32m/s as to improve gait speed for better community ambulation and to reduce fall risk    Time  12    Period  Weeks    Status  New    Target Date  12/01/19            Plan - 09/17/19 1710    Clinical Impression Statement  Pt demonstrates excellent motivation during therapy session today. She arrives complaining of 10/10 L shoulder pain. Limited evaluation of L shoulder/bicep pain. Pt is tender to palpation to L anterior shoulder and bicep. She has a large palpable mass along the mid belly of her L biceps. No redness, warmth, or edema noted. No distal swelling in LUE noted. Pt denies any prior swelling in wrist/hand. She demonstrates no active elbow flexion with fully pronated or neutral forearm position. She is able to perform some AROM L elbow flexion in full supination/palm up position. Pt provided a sling to support her LUE for comfort. Unfortunately the only sling available is an XL with is much too large but pt reports significant relief with her LUE supported in the sling. Pt also provided education today about placement of SAEBO NMES unit on  LLE for ankle dorsiflexion/eversion strengthening. She is able to complete all supine/hooklying exercises as instructed today. Pt encouraged to follow-up with her MD tomorrow regarding her severe L shoulder pain. Pt will benefit from PT services to address deficits in strength, balance, and mobility in order to return to full function at home.    Personal Factors and  Comorbidities  Comorbidity 1    Comorbidities  diabetes, high blood pressure, reflux,    Examination-Activity Limitations  Carry;Caring for Others;Bend;Locomotion Level;Self Feeding;Sleep;Toileting    Examination-Participation Restrictions  Cleaning;Community Activity;Driving;Laundry;Shop    Stability/Clinical Decision Making  Stable/Uncomplicated    Rehab Potential  Good    PT Frequency  2x / week    PT Duration  12 weeks    PT Treatment/Interventions  Manual techniques;Functional mobility training;Stair training;Gait training;Electrical Stimulation;Moist Heat;Ultrasound;Therapeutic exercise;Balance training;Neuromuscular re-education    PT Next Visit Plan  strengthening and balance    PT Home Exercise Plan  standing hip abd,    Consulted and Agree with Plan of Care  Family member/caregiver;Patient       Patient will benefit from skilled therapeutic intervention in order to improve the following deficits and impairments:  Abnormal gait, Decreased knowledge of use of DME, Dizziness, Pain, Decreased coordination, Impaired UE functional use, Decreased strength, Decreased endurance, Decreased activity tolerance, Decreased balance, Difficulty walking, Obesity  Visit Diagnosis: Difficulty in walking, not elsewhere classified  Muscle weakness (generalized)  Other abnormalities of gait and mobility     Problem List Patient Active Problem List   Diagnosis Date Noted  . E. coli UTI   . Diabetes mellitus type 2 in obese ([place])   . Chronic pain of right knee   . 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 Hemiparesis affecting left side as late effect of stroke ([place])   . Acute ischemic right middle cerebral artery (MCA) stroke ([place]) 07/22/2019  . Carotid artery dissection  (HCC) s/p stent placement 07/21/2019  . Diabetes mellitus type II, uncontrolled ([place]) 07/21/2019  . Acute blood loss anemia 07/21/2019  . Leukocytosis 07/21/2019  . Stroke (cerebrum) ([place]) - R MCA infarct s/p  tenecteplase and mechanical thrombectomy w/ d/t ICA dissection  07/15/2019  . Middle cerebral artery embolism, right 07/15/2019  . Controlled type 2 diabetes mellitus without complication, without long-term current use of insulin ([place]) 05/28/2017  . Attention deficit hyperactivity disorder (ADHD), combined type 04/18/2017  . Morbid obesity ([place]) 01/25/2016  . Umbilical hernia [id number]  . Hiatal hernia 12/08/2015  . History of Clostridium difficile colitis 06/19/2014  . HTN (hypertension) 12/23/2013  . PCOS (polycystic ovarian syndrome) 12/23/2013  . Bariatric surgery status 01/28/2013   Phillips Grout PT, DPT, GCS  Katai Marsico 09/17/2019, 5:20 PM  [place] MAIN [facility] SERVICES [address] [place], [place], [address] Phone: [phone]   Fax:  [phone]  Name: Kathryn Coffey MRN: [id number] Date of Birth: 1971-06-17

## 2019-09-17 NOTE — Telephone Encounter (Signed)
Message from patient

## 2019-09-18 ENCOUNTER — Other Ambulatory Visit: Payer: Self-pay

## 2019-09-18 ENCOUNTER — Encounter: Payer: Self-pay | Admitting: Neurology

## 2019-09-18 ENCOUNTER — Encounter: Payer: Self-pay | Admitting: Physical Medicine and Rehabilitation

## 2019-09-18 ENCOUNTER — Other Ambulatory Visit: Payer: Self-pay | Admitting: Physical Medicine and Rehabilitation

## 2019-09-18 ENCOUNTER — Encounter: Payer: No Typology Code available for payment source | Admitting: Physical Medicine and Rehabilitation

## 2019-09-18 ENCOUNTER — Ambulatory Visit (INDEPENDENT_AMBULATORY_CARE_PROVIDER_SITE_OTHER): Payer: No Typology Code available for payment source | Admitting: Neurology

## 2019-09-18 VITALS — BP 160/107 | HR 73 | Temp 97.8°F | Ht 65.0 in | Wt 239.0 lb

## 2019-09-18 VITALS — BP 171/106 | HR 101 | Temp 97.1°F | Ht 65.0 in | Wt 240.0 lb

## 2019-09-18 DIAGNOSIS — I10 Essential (primary) hypertension: Secondary | ICD-10-CM | POA: Diagnosis not present

## 2019-09-18 DIAGNOSIS — R519 Headache, unspecified: Secondary | ICD-10-CM | POA: Diagnosis not present

## 2019-09-18 DIAGNOSIS — I6521 Occlusion and stenosis of right carotid artery: Secondary | ICD-10-CM

## 2019-09-18 DIAGNOSIS — S46119A Strain of muscle, fascia and tendon of long head of biceps, unspecified arm, initial encounter: Secondary | ICD-10-CM

## 2019-09-18 DIAGNOSIS — G8194 Hemiplegia, unspecified affecting left nondominant side: Secondary | ICD-10-CM

## 2019-09-18 DIAGNOSIS — M25512 Pain in left shoulder: Secondary | ICD-10-CM

## 2019-09-18 DIAGNOSIS — I7771 Dissection of carotid artery: Secondary | ICD-10-CM | POA: Diagnosis not present

## 2019-09-18 DIAGNOSIS — I6601 Occlusion and stenosis of right middle cerebral artery: Secondary | ICD-10-CM

## 2019-09-18 DIAGNOSIS — R232 Flushing: Secondary | ICD-10-CM

## 2019-09-18 MED ORDER — LOSARTAN POTASSIUM 25 MG PO TABS: 25.0000 mg | ORAL_TABLET | Freq: Every day | ORAL | 1 refills | Status: DC

## 2019-09-18 MED ORDER — METHOCARBAMOL 500 MG PO TABS: 500.0000 mg | ORAL_TABLET | Freq: Four times a day (QID) | ORAL | 1 refills | Status: DC

## 2019-09-18 NOTE — Patient Instructions (Signed)
I had a long d/w patient and her husband about her recent stroke,right carotid dissection and occlusion, risk for recurrent stroke/TIAs, personally independently reviewed imaging studies and stroke evaluation results and answered questions.Continue aspirin 81 mg daily and Brilinta 90 mg twice daily for another month and then discontinue Brilinta and stay on aspirin alone for secondary stroke prevention and maintain strict control of hypertension with blood pressure goal below 130/90, diabetes with hemoglobin A1c goal below 6.5% and lipids with LDL cholesterol goal below 70 mg/dL. I also advised the patient to eat a healthy diet with plenty of whole grains, cereals, fruits and vegetables, exercise regularly and maintain ideal body weight.  Continue outpatient physical and occupational therapy.  Consider participation in the TRANSPORT 2 study at [place] with Dr Burr Medico for transcranial magnetic stimulation for improvement of left left upper extremity weakness following stroke later.  Check lipid profile and hemoglobin A1c today.  Followup in the future with me in stroke research clinic in 30 days for end of the [facility] stroke study visit.  Stroke Prevention Some medical conditions and behaviors are associated with a higher chance of having a stroke. You can help prevent a stroke by making nutrition, lifestyle, and other changes, including managing any medical conditions you may have. What nutrition changes can be made?   Eat healthy foods. You can do this by: ? Choosing foods high in fiber, such as fresh fruits and vegetables and whole grains. ? Eating at least 5 or more servings of fruits and vegetables a day. Try to fill half of your plate at each meal with fruits and vegetables. ? Choosing lean protein foods, such as lean cuts of meat, poultry without skin, fish, tofu, beans, and nuts. ? Eating low-fat dairy products. ? Avoiding foods that are high in salt (sodium). This can help lower blood  pressure. ? Avoiding foods that have saturated fat, trans fat, and cholesterol. This can help prevent high cholesterol. ? Avoiding processed and premade foods.  Follow your health care provider's specific guidelines for losing weight, controlling high blood pressure (hypertension), lowering high cholesterol, and managing diabetes. These may include: ? Reducing your daily calorie intake. ? Limiting your daily sodium intake to 1,500 milligrams (mg). ? Using only healthy fats for cooking, such as olive oil, canola oil, or sunflower oil. ? Counting your daily carbohydrate intake. What lifestyle changes can be made?  Maintain a healthy weight. Talk to your health care provider about your ideal weight.  Get at least 30 minutes of moderate physical activity at least 5 days a week. Moderate activity includes brisk walking, biking, and swimming.  Do not use any products that contain nicotine or tobacco, such as cigarettes and e-cigarettes. If you need help quitting, ask your health care provider. It may also be helpful to avoid exposure to secondhand smoke.  Limit alcohol intake to no more than 1 drink a day for nonpregnant women and 2 drinks a day for men. One drink equals 12 oz of beer, 5 oz of wine, or 1 oz of hard liquor.  Stop any illegal drug use.  Avoid taking birth control pills. Talk to your health care provider about the risks of taking birth control pills if: ? You are over 21 years old. ? You smoke. ? You get migraines. ? You have ever had a blood clot. What other changes can be made?  Manage your cholesterol levels. ? Eating a healthy diet is important for preventing high cholesterol. If cholesterol cannot be managed through  diet alone, you may also need to take medicines. ? Take any prescribed medicines to control your cholesterol as told by your health care provider.  Manage your diabetes. ? Eating a healthy diet and exercising regularly are important parts of managing your  blood sugar. If your blood sugar cannot be managed through diet and exercise, you may need to take medicines. ? Take any prescribed medicines to control your diabetes as told by your health care provider.  Control your hypertension. ? To reduce your risk of stroke, try to keep your blood pressure below 130/80. ? Eating a healthy diet and exercising regularly are an important part of controlling your blood pressure. If your blood pressure cannot be managed through diet and exercise, you may need to take medicines. ? Take any prescribed medicines to control hypertension as told by your health care provider. ? Ask your health care provider if you should monitor your blood pressure at home. ? Have your blood pressure checked every year, even if your blood pressure is normal. Blood pressure increases with age and some medical conditions.  Get evaluated for sleep disorders (sleep apnea). Talk to your health care provider about getting a sleep evaluation if you snore a lot or have excessive sleepiness.  Take over-the-counter and prescription medicines only as told by your health care provider. Aspirin or blood thinners (antiplatelets or anticoagulants) may be recommended to reduce your risk of forming blood clots that can lead to stroke.  Make sure that any other medical conditions you have, such as atrial fibrillation or atherosclerosis, are managed. What are the warning signs of a stroke? The warning signs of a stroke can be easily remembered as BEFAST.  B is for balance. Signs include: ? Dizziness. ? Loss of balance or coordination. ? Sudden trouble walking.  E is for eyes. Signs include: ? A sudden change in vision. ? Trouble seeing.  F is for face. Signs include: ? Sudden weakness or numbness of the face. ? The face or eyelid drooping to one side.  A is for arms. Signs include: ? Sudden weakness or numbness of the arm, usually on one side of the body.  S is for speech. Signs  include: ? Trouble speaking (aphasia). ? Trouble understanding.  T is for time. ? These symptoms may represent a serious problem that is an emergency. Do not wait to see if the symptoms will go away. Get medical help right away. Call your local emergency services (911 in the [place] Do not drive yourself to the hospital.  Other signs of stroke may include: ? A sudden, severe headache with no known cause. ? Nausea or vomiting. ? Seizure. Where to find more information For more information, visit:  American Stroke Association: www.strokeassociation.org  National Stroke Association: www.stroke.org Summary  You can prevent a stroke by eating healthy, exercising, not smoking, limiting alcohol intake, and managing any medical conditions you may have.  Do not use any products that contain nicotine or tobacco, such as cigarettes and e-cigarettes. If you need help quitting, ask your health care provider. It may also be helpful to avoid exposure to secondhand smoke.  Remember BEFAST for warning signs of stroke. Get help right away if you or a loved one has any of these signs. This information is not intended to replace advice given to you by your health care provider. Make sure you discuss any questions you have with your health care provider. Document Revised: 07/20/2017 Document Reviewed: 09/12/2016 Elsevier Patient Education  2020 Elsevier  Inc.

## 2019-09-18 NOTE — Progress Notes (Signed)
Subjective:    Patient ID: Kathryn Coffey, female    DOB: Oct 12, 1970, 49 y.o.   MRN: [id number]  HPI  Kathryn Coffey presents for 2nd follow-up of R MCA stroke after CIR admission.   Her left upper strength was improving but she was having pain and received a corticosteroid shot, after which she experienced intense upper arm pain and a bulge in her LUE. She denies any pain or bruising at the site, but the pain is intense. She has taken her Tizanidine which helps with the pain, but it makes her too sleepy to take during the day. Her therapist thought she had a biceps rupture and recommended no therapy this week. She has purchased a stimulator and applies it to her arm, but at high levels it pains her. She asks about how this will affect the prognosis of her strength and how it will be treated. She has been having associated nausea and worsening weakness in her LUE.   Her husband plans to return to work in a few days and she will be home with her mom and 89 year old son. 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  She has not been able to do much around the house, though she is ambulating within her home. She has difficulty getting dressed, washing herself, cooking.   She has also been having hot flashes and headache, which she thinks may be related to HTN. Her SBP is up to 160s on today's office visit.     Pain Inventory Average Pain 8 Pain Right Now 10 My pain is sharp and aching  In the last 24 hours, has pain interfered with the following? General activity 10 Relation with others 8 Enjoyment of life 10 What TIME of day is your pain at its worst? na Sleep (in general) Poor  Pain is worse with: some activites Pain improves with: rest and medication Relief from Meds: 9  Mobility walk without assistance walk with assistance use a cane ability to climb steps?  yes do you  drive?  no  Function employed # of hrs/week 0\ I need assistance with the following:  dressing, bathing, meal prep, household duties and shopping  Neuro/Psych weakness spasms anxiety  Prior Studies Any changes since last visit?  no  Physicians involved in your care Any changes since last visit?  no   Family History  Problem Relation Age of Onset  . Breast cancer Maternal Grandmother 30  . Diabetes Mother   . Diabetes Father   . Heart attack Father    Social History   Socioeconomic History  . Marital status: Married    Spouse name: Not on file  . Number of children: Not on file  . Years of education: Not on file  . Highest education level: Not on file  Occupational History  . Not on file  Tobacco Use  . Smoking status: Never Smoker  . Smokeless tobacco: Never Used  Substance and Sexual Activity  . Alcohol use: Yes    Alcohol/week: 0.0 standard drinks    Comment: SOCIAL  . Drug use: No  . Sexual activity: Yes    Birth control/protection: I.U.D., Other-see comments    Comment: BTL  Other Topics Concern  . Not on file  Social History Narrative  . Not on file   Social Determinants of Health   Financial Resource Strain:   .  Difficulty of Paying Living Expenses: Not on file  Food Insecurity:   . Worried About Charity fundraiser in the Last Year: Not on file  . Ran Out of Food in the Last Year: Not on file  Transportation Needs:   . Lack of Transportation (Medical): Not on file  . Lack of Transportation (Non-Medical): Not on file  Physical Activity:   . Days of Exercise per Week: Not on file  . Minutes of Exercise per Session: Not on file  Stress:   . Feeling of Stress : Not on file  Social Connections:   . Frequency of Communication with Friends and Family: Not on file  . Frequency of Social Gatherings with Friends and Family: Not on file  . Attends Religious Services: Not on file  . Active Member of Clubs or Organizations: Not on file  . Attends English as a second language teacher Meetings: Not on file  . Marital Status: Not on file   Past Surgical History:  Procedure Laterality Date  . BREAST EXCISIONAL BIOPSY Left    cyst excision - Dr. Patty Sermons  . CESAREAN SECTION    . CHOLECYSTECTOMY    . COLONOSCOPY    . EAR CYST EXCISION Right 07/04/2018   Procedure: EXCISION GLOMUS TUMOR THUMBNAIL;  Surgeon: Corky Mull, MD;  Location: [facility] ORS;  Service: Orthopedics;  Laterality: Right;  . ESOPHAGOGASTRODUODENOSCOPY    . ESOPHAGOGASTRODUODENOSCOPY (EGD) WITH PROPOFOL N/A 12/06/2015   Procedure: ESOPHAGOGASTRODUODENOSCOPY (EGD) WITH PROPOFOL;  Surgeon: Manya Silvas, MD;  Location: [facility] ENDOSCOPY;  Service: Endoscopy;  Laterality: N/A;  . IR ANGIO INTRA EXTRACRAN SEL INTERNAL CAROTID UNI L MOD SED  07/15/2019  . IR ANGIO VERTEBRAL SEL SUBCLAVIAN INNOMINATE UNI R MOD SED  07/15/2019  . IR CT HEAD LTD  07/15/2019  . IR INTRAVSC STENT CERV CAROTID W/O EMB-PROT MOD SED INC ANGIO  07/15/2019  . IR PERCUTANEOUS ART THROMBECTOMY/INFUSION INTRACRANIAL INC DIAG ANGIO  07/15/2019  . LAPAROSCOPIC GASTRIC BANDING    . LAPAROSCOPIC GASTRIC RESTRICTIVE DUODENAL PROCEDURE (DUODENAL SWITCH) N/A 01/25/2016   Procedure: LAPAROSCOPIC GASTRIC RESTRICTIVE DUODENAL PROCEDURE (DUODENAL SWITCH);  Surgeon: Ladora Daniel, MD;  Location: [facility] ORS;  Service: General;  Laterality: N/A;  . OVARY SURGERY     x2  . RADIOLOGY WITH ANESTHESIA N/A 07/15/2019   Procedure: IR WITH ANESTHESIA;  Surgeon: Luanne Bras, MD;  Location: [facility];  Service: Radiology;  Laterality: N/A;  . TONSILLECTOMY    . TUBAL LIGATION    . UMBILICAL HERNIA REPAIR N/A 01/25/2016   Procedure: LAPAROSCOPIC UMBILICAL HERNIA;  Surgeon: Ladora Daniel, MD;  Location: [facility] ORS;  Service: General;  Laterality: N/A;  . UMBILICAL HERNIA REPAIR N/A 10/20/2016   Procedure: HERNIA REPAIR UMBILICAL ADULT;  Surgeon: Leonie Green, MD;  Location: [facility] ORS;  Service: General;  Laterality: N/A;   Past Medical  History:  Diagnosis Date  . Abdominal wall cellulitis 2013 and 2014  . Asthma    allergic to grasses  . B12 deficiency   . Complication of anesthesia   . Costochondritis   . Diabetes mellitus without complication ([place])   . Gastric anomaly    multiple small ulcers  . GERD (gastroesophageal reflux disease)   . Herpes 06/2018   POSSIBLY IN EYE- PT TAKING VALTREX AND WILL SEE OPTHAMOLOGIST TO SEE IF VALTREX IS WORKING  . History of abnormal cervical Pap smear   . History of Clostridium difficile colitis   . History of hiatal hernia   . History  of kidney stones   . Hypertension   . IBS (irritable bowel syndrome)   . Migraine   . Morbid obesity ([place])    s/p attempted gastric banding now decompressed  . PCOS (polycystic ovarian syndrome)   . PONV (postoperative nausea and vomiting)    WITH SPINAL ONLY  . Stroke ([place])    BP (!) 160/107   Pulse 73   Temp 97.8 F (36.6 C)   Ht 5\' 5"  (1.651 m)   Wt 239 lb (108.4 kg)   SpO2 97%   BMI 39.77 kg/m   Opioid Risk Score:   Fall Risk Score:  `1  Depression screen PHQ 2/9  Depression screen [facility] 2/9 06/06/2017 10/11/2015  Decreased Interest 1 0  Down, Depressed, Hopeless 0 0  PHQ - 2 Score 1 0     Review of Systems  Constitutional: Negative.   HENT: Negative.   Eyes: Negative.   Respiratory: Negative.   Cardiovascular: Negative.   Gastrointestinal: Positive for nausea.  Endocrine: Negative.   Genitourinary: Negative.   Musculoskeletal: Positive for arthralgias and myalgias.  Skin: Negative.   Allergic/Immunologic: Negative.   Neurological: Positive for weakness.  Hematological: Negative.   Psychiatric/Behavioral: The patient is nervous/anxious.   All other systems reviewed and are negative.      Objective:   Physical Exam Constitutional: No distress . Vital signs reviewed.Sitting up in [place]. HENT: Normocephalic. Atraumatic. Eyes: EOMI. No discharge. Cardiovascular: No JVD. Respiratory: Normal effort. No  stridor. GI: Non-distended. Skin: Warm and dry. Intact. Psych: Normal mood. Normal behavior. Musc: No edema in extremities. No tenderness in extremities. Neuro: Alert Left facial droop Motor: RUE/RLE: 5/5 proximal distal LUE: 0/5 proximal distal. Bulge and tightness at proximal biceps location with point tenderness. LLE: 4--4/5 proximal to distal  No increase in tone appreciated     Assessment & Plan:  Kathryn Coffey presents for 2nd follow-up of R MCA stroke after CIR admission.   1) Suspected left proximal biceps tendon rupture --Patient presents with exquisite LUE pain following corticosteroid injection, with bulge near proximal biceps tendon on exam, with point tenderness. Suspect proximal biceps tendon rupture. Recommend MRI for diagnosis but patient says she can not tolerate machine. I have ordered stat US of LUE to assess and will call patient with results. She will obtain [facility] at [facility].   2) R MCA stroke --Avoid therapy of LUE at this time while assessing for tendon rupture.   3) Headache, hot flashes 2/2 HTN --SBP in 160s in office. Patient was on Losartan in past and tolerated well. Prescribed 25mg  daily. Advised that she check her BP daily at home.    Thirty minutes of face to face patient care time were spent during this visit. All questions were encouraged and answered. Follow up with me at next scheduled appointment. I will call today with [place] results.

## 2019-09-18 NOTE — Progress Notes (Signed)
[facility] [address] [place]. [place] [address] [phone]       OFFICE FOLLOW-UP NOTE  Kathryn Coffey Date of Birth:  1971/05/18 Medical Record Number:  [id number]   HPI: Kathryn Coffey is a pleasant 49 year old Caucasian lady seen today for initial office follow-up visit following hospital admission for stroke in November 2020.  History is obtained from the patient and her husband was accompanying her as well as review of electronic medical records and have personally reviewed imaging films in PACS.  She presented with last seen normal at 5 AM on 07/15/2019 when she went to the bathroom and then fell asleep.  When she woke up at 10 AM she fell down and refused to go to the hospital initially when EMS got there.  Subsequently complained of significant headache and was noted to be flaccid in the left upper and lower extremity in the ambulance was called back code stroke was called.  Vital signs were stable on admission.  CT angiogram of the head and neck showed right terminal ICA and right middle cerebral artery occlusion in the M1 with significant penumbra.  She was not a TPA candidate as she presented beyond TPA window but she was enrolled in the timeless trial ( iv tenecteplase versus placebo) as well as taken for emergent mechanical thrombectomy which was performed successfully with revascularization of right M1 and placement of pipeline stent in the right terminal carotid over the site of underlying dissection.  Repeat CTA of the head and neck performed as per the timeless trial protocol showed repeat occlusion of the right carotid artery and the stent near its origin but the right middle cerebral artery was patent.  MRI scan showed right basal ganglia infarct with trace hemorrhagic transformation and infarct also involving still in the right temporal lobe as well as small amount in the right frontal lobe.  2D echo showed normal ejection fraction.  Hemoglobin A1c was elevated  at 8.0.  LDL cholesterol was 41 mg percent.  She was not on a statin.  Patient was transferred to inpatient rehab and after being started on aspirin and Brilinta and did well in rehab and was discharged home.  She is now able to walk with a 4 pronged cane and left leg strength is coming back.  She was obtaining improvement in the left arm and was able to do bicep curls but the for the last few days she has noticed severe pain in the left biceps with a knot and there is concern for biceps tendon rupture and rehab physician has ordered an ultrasound of the arm which is not been done yet.  Patient is on aspirin and Brilinta and tolerating it well without bruising or bleeding.  She recently saw Dr. Estanislado Pandy for follow-up was ordered a carotid ultrasound for follow-up.  Patient states she has been eating healthy and has lost 36 pounds.  She has been quite active and finished home therapy and is now started outpatient therapy.  She states her sugars are doing much better and we plan to repeat hemoglobin A1c and lipid profile today.  ROS:   14 system review of systems is positive for left arm pain, tenderness, not, weakness, stiffness, gait difficulty and all other systems negative PMH:  Past Medical History:  Diagnosis Date  . Abdominal wall cellulitis 2013 and 2014  . Asthma    allergic to grasses  . B12 deficiency   . Complication of anesthesia   . Costochondritis   .  Diabetes mellitus without complication ([place])   . Gastric anomaly    multiple small ulcers  . GERD (gastroesophageal reflux disease)   . Herpes 06/2018   POSSIBLY IN EYE- PT TAKING VALTREX AND WILL SEE OPTHAMOLOGIST TO SEE IF VALTREX IS WORKING  . History of abnormal cervical Pap smear   . History of Clostridium difficile colitis   . History of hiatal hernia   . History of kidney stones   . Hypertension   . IBS (irritable bowel syndrome)   . Migraine   . Morbid obesity ([place])    s/p attempted gastric banding now decompressed  .  PCOS (polycystic ovarian syndrome)   . PONV (postoperative nausea and vomiting)    WITH SPINAL ONLY  . Stroke [facility])     Social History:  Social History   Socioeconomic History  . Marital status: Married    Spouse name: Not on file  . Number of children: Not on file  . Years of education: Not on file  . Highest education level: Not on file  Occupational History  . Not on file  Tobacco Use  . Smoking status: Never Smoker  . Smokeless tobacco: Never Used  Substance and Sexual Activity  . Alcohol use: Yes    Alcohol/week: 0.0 standard drinks    Comment: SOCIAL  . Drug use: No  . Sexual activity: Yes    Birth control/protection: I.U.D., Other-see comments    Comment: BTL  Other Topics Concern  . Not on file  Social History Narrative  . Not on file   Social Determinants of Health   Financial Resource Strain:   . Difficulty of Paying Living Expenses: Not on file  Food Insecurity:   . Worried About Charity fundraiser in the Last Year: Not on file  . Ran Out of Food in the Last Year: Not on file  Transportation Needs:   . Lack of Transportation (Medical): Not on file  . Lack of Transportation (Non-Medical): Not on file  Physical Activity:   . Days of Exercise per Week: Not on file  . Minutes of Exercise per Session: Not on file  Stress:   . Feeling of Stress : Not on file  Social Connections:   . Frequency of Communication with Friends and Family: Not on file  . Frequency of Social Gatherings with Friends and Family: Not on file  . Attends Religious Services: Not on file  . Active Member of Clubs or Organizations: Not on file  . Attends Archivist Meetings: Not on file  . Marital Status: Not on file  Intimate Partner Violence:   . Fear of Current or Ex-Partner: Not on file  . Emotionally Abused: Not on file  . Physically Abused: Not on file  . Sexually Abused: Not on file    Medications:   Current Outpatient Medications on File Prior to Visit    Medication Sig Dispense Refill  . acetaminophen (TYLENOL) 325 MG tablet Take 1-2 tablets (325-650 mg total) by mouth every 4 (four) hours as needed for mild pain.    Marland Kitchen albuterol (PROVENTIL HFA;VENTOLIN HFA) 108 (90 Base) MCG/ACT inhaler Inhale 2 puffs into the lungs every 6 (six) hours as needed for wheezing or shortness of breath.    . amphetamine-dextroamphetamine (ADDERALL) 10 MG tablet Take 1 tablet (10 mg total) by mouth 2 (two) times daily with a meal. 120 tablet 0  . aspirin 81 MG chewable tablet Chew 1 tablet (81 mg total) by mouth daily.    Marland Kitchen  buPROPion (WELLBUTRIN) 100 MG tablet Take 1 tablet (100 mg total) by mouth 2 (two) times daily. 60 tablet 0  . Calcium Carb-Cholecalciferol (CALCIUM + VITAMIN D3 PO) Take 1 tablet by mouth daily.    . ferrous sulfate 325 (65 FE) MG tablet Take 1 tablet (325 mg total) by mouth daily with breakfast. 30 tablet 0  . fexofenadine (ALLEGRA) 180 MG tablet Take 180 mg by mouth daily as needed for allergies.     . fluticasone (FLONASE) 50 MCG/ACT nasal spray Place 2 sprays into both nostrils daily as needed for allergies or rhinitis.     Marland Kitchen liraglutide (VICTOZA) 18 MG/3ML SOPN Inject 0.2 mLs (1.2 mg total) into the skin daily.    . Menthol-Methyl Salicylate (MUSCLE RUB) 10-15 % CREA Apply 1 application topically 2 (two) times daily as needed for muscle pain.  0  . metFORMIN (GLUCOPHAGE) 500 MG tablet Take 500 mg by mouth 2 (two) times daily.  11  . mirabegron ER (MYRBETRIQ) 25 MG TB24 tablet Take 1 tablet (25 mg total) by mouth daily. 30 tablet 0  . Multiple Vitamins-Minerals (MULTIVITAMIN PO) Take 2 tablets by mouth daily.    . NON FORMULARY 1 Dose once a week. Wednesdays    . omeprazole (PRILOSEC) 40 MG capsule Take 40 mg by mouth daily. Dose unknown    . ticagrelor (BRILINTA) 90 MG TABS tablet Take 1 tablet (90 mg total) by mouth 2 (two) times daily. 60 tablet 1  . tiZANidine (ZANAFLEX) 4 MG tablet Take 1 tablet (4 mg total) by mouth at bedtime. 30 tablet  0  . topiramate (TOPAMAX) 25 MG tablet Take 1 tablet (25 mg total) by mouth at bedtime. 30 tablet 0   No current facility-administered medications on file prior to visit.    Allergies:   Allergies  Allergen Reactions  . Codeine Other (See Comments)    Headaches and hot flashes  . Azithromycin Other (See Comments)    Abdominal wall abcess  . Ivp Dye [Iodinated Diagnostic Agents] Hives  . Lexapro [Escitalopram] Other (See Comments)    migraine  . Vicodin [Hydrocodone-Acetaminophen] Other (See Comments)    migraine    Physical Exam General: Obese middle-aged Caucasian lady, seated, in no evident distress Head: head normocephalic and atraumatic.  Neck: supple with no carotid or supraclavicular bruits Cardiovascular: regular rate and rhythm, no murmurs Musculoskeletal: no deformity.  Left upper extremity is held in a sling. Skin:  no rash/petichiae Vascular:  Normal pulses all extremities Vitals:   09/18/19 1300  BP: (!) 171/106  Pulse: (!) 101  Temp: (!) 97.1 F (36.2 C)   Neurologic Exam Mental Status: Awake and fully alert. Oriented to place and time. Recent and remote memory intact. Attention span, concentration and fund of knowledge appropriate. Mood and affect appropriate.  Cranial Nerves: Fundoscopic exam reveals sharp disc margins. Pupils equal, briskly reactive to light. Extraocular movements full without nystagmus. Visual fields full to confrontation. Hearing intact. Facial sensation intact.  Mild left lower facial asymmetry.  Tongue, palate moves normally and symmetrically.  Motor: Spastic left hemiplegia with left upper extremity 1-2/5 strength proximally at the shoulder and hand.  Significant weakness of biceps with pain and tenderness.  Left lower extremity strength is 4+/5 with mild weakness of hip flexor and ankle dorsiflexors.  Tone is increased on the left compared to the right.    sensory.: intact to touch ,pinprick .position and vibratory sensation.   Coordination: Rapid alternating movements normal in all extremities. Finger-to-nose and heel-to-shin  performed accurately bilaterally. Gait and Station: Arises from chair without difficulty. Stance is normal. Gait demonstrates circumduction with mild left foot drop.   Reflexes: 1+ and symmetric. Toes downgoing.   NIHSS  5 Modified Rankin  3   ASSESSMENT: 49 year old lady with embolic right MCA infarct in November 2020 secondary to right M1 occlusion embolization from terminal right carotid stenosis from underlying dissection treated with successful mechanical thrombectomy and placement of pipeline stent as well as participating in the timeless stroke study (IV tenecteplase versus placebo) .  Unfortunately the right carotid stent occluded but without significant neurological worsening.  Patient has residual spastic left hemiplegia with mostly upper extremity weakness.  Vascular risk factors of diabetes, obesity, hypertension and carotid dissection.     PLAN: I had a long d/w patient and her husband about her recent stroke,right carotid dissection and occlusion, risk for recurrent stroke/TIAs, personally independently reviewed imaging studies and stroke evaluation results and answered questions.Continue aspirin 81 mg daily and Brilinta 90 mg twice daily for another month and then discontinue Brilinta and stay on aspirin alone for secondary stroke prevention and maintain strict control of hypertension with blood pressure goal below 130/90, diabetes with hemoglobin A1c goal below 6.5% and lipids with LDL cholesterol goal below 70 mg/dL. I also advised the patient to eat a healthy diet with plenty of whole grains, cereals, fruits and vegetables, exercise regularly and maintain ideal body weight.  Continue outpatient physical and occupational therapy.  Consider participation in the TRANSPORT 2 study at [place] with Dr Burr Medico for transcranial magnetic stimulation for improvement of left left upper extremity  weakness following stroke later.  Check lipid profile and hemoglobin A1c today.  Followup in the future with me in stroke research clinic in 30 days for end of the Timeless stroke study visit. Greater than 50% of time during this 25 minute visit was spent on counseling,explanation of diagnosis, planning of further management, discussion with patient and family and coordination of care Antony Contras, MD Note: This document was prepared with digital dictation and possible smart phrase technology. Any transcriptional errors that result from this process are unintentional

## 2019-09-19 LAB — HEMOGLOBIN A1C
Est. average glucose Bld gHb Est-mCnc: 137 mg/dL
Hgb A1c MFr Bld: 6.4 % — ABNORMAL HIGH (ref 4.8–5.6)

## 2019-09-19 LAB — LIPID PANEL
Chol/HDL Ratio: 2.9 ratio (ref 0.0–4.4)
Cholesterol, Total: 104 mg/dL (ref 100–199)
HDL: 36 mg/dL — ABNORMAL LOW (ref >39)
LDL Chol Calc (NIH): 40 mg/dL (ref 0–99)
Triglycerides: 170 mg/dL — ABNORMAL HIGH (ref 0–149)
VLDL Cholesterol Cal: 28 mg/dL (ref 5–40)

## 2019-09-19 MED ORDER — TIZANIDINE HCL 2 MG PO CAPS: 2.0000 mg | ORAL_CAPSULE | Freq: Three times a day (TID) | ORAL | 0 refills | Status: AC

## 2019-09-22 ENCOUNTER — Encounter: Payer: Self-pay | Admitting: Occupational Therapy

## 2019-09-22 ENCOUNTER — Ambulatory Visit: Payer: No Typology Code available for payment source | Admitting: Physical Therapy

## 2019-09-22 ENCOUNTER — Other Ambulatory Visit: Payer: Self-pay | Admitting: Physical Medicine and Rehabilitation

## 2019-09-22 ENCOUNTER — Ambulatory Visit: Payer: No Typology Code available for payment source | Admitting: Occupational Therapy

## 2019-09-22 ENCOUNTER — Other Ambulatory Visit: Payer: Self-pay

## 2019-09-22 ENCOUNTER — Ambulatory Visit
Payer: No Typology Code available for payment source | Attending: Physical Medicine and Rehabilitation | Admitting: Occupational Therapy

## 2019-09-22 ENCOUNTER — Ambulatory Visit
Admission: RE | Admit: 2019-09-22 | Discharge: 2019-09-22 | Disposition: A | Discharge status: Home / Self Care | Payer: No Typology Code available for payment source | Source: Ambulatory Visit | Attending: Physical Medicine and Rehabilitation | Admitting: Physical Medicine and Rehabilitation

## 2019-09-22 DIAGNOSIS — M6281 Muscle weakness (generalized): Secondary | ICD-10-CM | POA: Insufficient documentation

## 2019-09-22 DIAGNOSIS — M25512 Pain in left shoulder: Secondary | ICD-10-CM | POA: Insufficient documentation

## 2019-09-22 DIAGNOSIS — S46119A Strain of muscle, fascia and tendon of long head of biceps, unspecified arm, initial encounter: Secondary | ICD-10-CM | POA: Diagnosis not present

## 2019-09-22 DIAGNOSIS — R262 Difficulty in walking, not elsewhere classified: Secondary | ICD-10-CM | POA: Insufficient documentation

## 2019-09-22 DIAGNOSIS — R2689 Other abnormalities of gait and mobility: Secondary | ICD-10-CM | POA: Insufficient documentation

## 2019-09-22 DIAGNOSIS — I63511 Cerebral infarction due to unspecified occlusion or stenosis of right middle cerebral artery: Secondary | ICD-10-CM | POA: Insufficient documentation

## 2019-09-22 DIAGNOSIS — R278 Other lack of coordination: Secondary | ICD-10-CM | POA: Insufficient documentation

## 2019-09-22 DIAGNOSIS — S46212A Strain of muscle, fascia and tendon of other parts of biceps, left arm, initial encounter: Secondary | ICD-10-CM

## 2019-09-22 NOTE — Therapy (Addendum)
[place] MAIN [facility] SERVICES [address] [place], [place], [address] Phone: [phone]   Fax:  [phone]  Occupational Therapy Treatment  Patient Details  Name: Kathryn Coffey MRN: [id number] Date of Birth: July 09, 1971 Referring Provider (OT): Raulkar   Encounter Date: 09/22/2019  OT End of Session - 09/22/19 [id number]    Visit Number  2    Number of Visits  24    Date for OT Re-Evaluation  12/01/19    Authorization Type  Progress report period starting  on 09/08/2019    Authorization Time Period  FOTO on the 6th, 12th, 18th, 24th, 30th, and 36th visit.    OT Start Time  1518    OT Stop Time  1600    OT Time Calculation (min)  42 min    Activity Tolerance  Patient tolerated treatment well    Behavior During Therapy  WFL for tasks assessed/performed       Past Medical History:  Diagnosis Date  . Abdominal wall cellulitis 2013 and 2014  . Asthma    allergic to grasses  . B12 deficiency   . Complication of anesthesia   . Costochondritis   . Diabetes mellitus without complication ([place])   . Gastric anomaly    multiple small ulcers  . GERD (gastroesophageal reflux disease)   . Herpes 06/2018   POSSIBLY IN EYE- PT TAKING VALTREX AND WILL SEE OPTHAMOLOGIST TO SEE IF VALTREX IS WORKING  . History of abnormal cervical Pap smear   . History of Clostridium difficile colitis   . History of hiatal hernia   . History of kidney stones   . Hypertension   . IBS (irritable bowel syndrome)   . Migraine   . Morbid obesity ([place])    s/p attempted gastric banding now decompressed  . PCOS (polycystic ovarian syndrome)   . PONV (postoperative nausea and vomiting)    WITH SPINAL ONLY  . Stroke [facility])     Past Surgical History:  Procedure Laterality Date  . BREAST EXCISIONAL BIOPSY Left    cyst excision - Dr. Patty Coffey  . CESAREAN SECTION    . CHOLECYSTECTOMY    . COLONOSCOPY    . EAR CYST EXCISION Right 07/04/2018   Procedure: EXCISION GLOMUS  TUMOR THUMBNAIL;  Surgeon: Corky Mull, MD;  Location: [facility] ORS;  Service: Orthopedics;  Laterality: Right;  . ESOPHAGOGASTRODUODENOSCOPY    . ESOPHAGOGASTRODUODENOSCOPY (EGD) WITH PROPOFOL N/A 12/06/2015   Procedure: ESOPHAGOGASTRODUODENOSCOPY (EGD) WITH PROPOFOL;  Surgeon: Manya Silvas, MD;  Location: [facility] ENDOSCOPY;  Service: Endoscopy;  Laterality: N/A;  . IR ANGIO INTRA EXTRACRAN SEL INTERNAL CAROTID UNI L MOD SED  07/15/2019  . IR ANGIO VERTEBRAL SEL SUBCLAVIAN INNOMINATE UNI R MOD SED  07/15/2019  . IR CT HEAD LTD  07/15/2019  . IR INTRAVSC STENT CERV CAROTID W/O EMB-PROT MOD SED INC ANGIO  07/15/2019  . IR PERCUTANEOUS ART THROMBECTOMY/INFUSION INTRACRANIAL INC DIAG ANGIO  07/15/2019  . LAPAROSCOPIC GASTRIC BANDING    . LAPAROSCOPIC GASTRIC RESTRICTIVE DUODENAL PROCEDURE (DUODENAL SWITCH) N/A 01/25/2016   Procedure: LAPAROSCOPIC GASTRIC RESTRICTIVE DUODENAL PROCEDURE (DUODENAL SWITCH);  Surgeon: Ladora Daniel, MD;  Location: [facility] ORS;  Service: General;  Laterality: N/A;  . OVARY SURGERY     x2  . RADIOLOGY WITH ANESTHESIA N/A 07/15/2019   Procedure: IR WITH ANESTHESIA;  Surgeon: Luanne Bras, MD;  Location: [place];  Service: Radiology;  Laterality: N/A;  . TONSILLECTOMY    . TUBAL LIGATION    .  UMBILICAL HERNIA REPAIR N/A 01/25/2016   Procedure: LAPAROSCOPIC UMBILICAL HERNIA;  Surgeon: Ladora Daniel, MD;  Location: [facility] ORS;  Service: General;  Laterality: N/A;  . UMBILICAL HERNIA REPAIR N/A 10/20/2016   Procedure: HERNIA REPAIR UMBILICAL ADULT;  Surgeon: Leonie Green, MD;  Location: [facility] ORS;  Service: General;  Laterality: N/A;    There were no vitals filed for this visit.  Subjective Assessment - 09/22/19 1638    Subjective   Pt. was accompanied by her husband.    Pertinent History  Per pt. chart. Pt. is a 49 y.o. female with history of HTN, T2DM, obesity who was admitted on 07/15/19 with onset of HA progressing to flaccid left hemiparesis a few hours later.  CT  head showed acute nonhemorrhagic infarct in right basal ganglia with hyperdense right MCA sign.  CT A/P head neck showed perfusion deficit with occluded right ICA and slow flow in M1.  She underwent cerebral angiogram with revascularization of right MCA with T1C1 revascularization and repair of right ICA with flow diverter device. Stroke felt to be embolic likely due to ICA dissection. Postprocedure head CT was negative for bleed repeat CTA head neck showed reocclusion of right ICA origin and occlusion of right ICA stent but now with patent right MCA. Pt. received inpatient rehab services for 3 weeks, and home health services.    Currently in Pain?  Yes    Pain Score  6     Pain Location  Shoulder    Pain Orientation  Left      OT TREATMENT    Therapeutic Exercise:   Pt. worked on facilitation of active wrist, and digit extension, thumb palmar abduction, and adduction. Pt. was able to achieve trace digit extension, and  consistent thumb palmar abduction, and adduction in gravity eliminated plane. Pt. has active digit flexion, and worked on relaxing her digits after flexing. Pt. Worked while seated with the w/c support tray under her left UE.   Manual Therapy:  Pt. Tolerated soft tissues mobilizations in the wrist for carpal rolls, and metacarpal spread stretches independent of, and in preparation there. Ex. Of the wrist, and digits.   Response to Treatment:  Pt. had an ultrasound today secondary to a left biceps tendon tear. Pt. is  waiting for a response, and follow-up from her physican following the test. Pt. reports 2/10 pain at rest. 6/10 pain in her left shoulder during movement at home.  Pt. focused only on facilitating distal left wrist, and digit motion today with her left forearm supported on the w/c tray. Pt. presents with compensation from her trunk, and other extremities when attempting movements. Pt. continues to work on improving UE functioning in order to improve overall engagement   during ADLs and IADL tasks.                     OT Education - 09/22/19 1639    Education Details  OT services, POC, LUE ROM, LUE positioning    Person(s) Educated  Patient;Spouse    Methods  Demonstration    Comprehension  Returned demonstration;Verbalized understanding          OT Long Term Goals - 09/08/19 1402      OT LONG TERM GOAL #1   Title  Pt. will increase left shoulder flexion PROM by 20 degrees to assist with UE dressing    Baseline  Eval: Shoulder flexion PROM 80 degrees    Time  12    Period  Weeks    Status  New    Target Date  12/01/19      OT LONG TERM GOAL #2   Title  Pt. will be able to actively stabilize left shoulder for 10 seconds in supine with shoulde flexed to 90 degrees, and elbow in extension in preparation for functional reaching.    Baseline  Eval: Pt.is unable to stabilize left shoulder    Time  12    Period  Weeks    Status  New    Target Date  12/01/19      OT LONG TERM GOAL #3   Title  Pt. will independently perform hand to face patterns with the LUE in preparation for light self grooming tasks.    Baseline  Eval: Pt. is unable to perform hand to face patterns with the LUE.    Time  12    Period  Weeks    Status  New    Target Date  12/01/19      OT LONG TERM GOAL #4   Title  Pt. will initiate 10 degrees of active left wrist extension  in preparation for actively lifting her hand off of a surface.    Baseline  Eval: Pt. is unable    Time  12    Period  Weeks    Status  New    Target Date  12/01/19      OT LONG TERM GOAL #5   Title  Pt. will initiate left hand digit extension in order to be able to actively release an objects during ADL tasks.    Baseline  Eval: Pt. is unable    Time  12    Period  Weeks    Status  New    Target Date  12/01/19      Long Term Additional Goals   Additional Long Term Goals  Yes      OT LONG TERM GOAL #6   Title  Pt. increase left hand digit flexion in order to be able to hold  her toothbrush while applying toothpaste with her left right hand.    Baseline  Eval: Pt. is unable    Time  12    Period  Weeks    Status  New    Target Date  12/01/19      OT LONG TERM GOAL #7   Title  --            Plan - 09/22/19 1640    Clinical Impression Statement Pt. had an ultrasound today secondary to a left biceps tendon tear. Pt. is  waiting for a response, and follow-up from her physican following the test. Pt. reports 2/10 pain at rest. 6/10 pain in her left shoulder during movement at home.  Pt. focused only on facilitating distal left wrist, and digit motion today with her left forearm supported on the w/c tray. Pt. presents with compensation from her trunk, and other extremities when attempting movements. Pt. continues to work on improving UE functioning in order to improve overall engagement  during ADLs and IADL tasks.    Occupational performance deficits (Please refer to evaluation for details):  ADL's;IADL's    Rehab Potential  Good    Clinical Decision Making  Several treatment options, min-mod task modification necessary    Comorbidities Affecting Occupational Performance:  May have comorbidities impacting occupational performance    Modification or Assistance to Complete Evaluation   Min-Moderate modification of tasks or assist with assess necessary to  complete eval    OT Frequency  2x / week    OT Duration  12 weeks    OT Treatment/Interventions  Self-care/ADL training;Moist Heat;DME and/or AE instruction;Neuromuscular education;Therapeutic exercise;Therapeutic activities;Energy conservation;Patient/family education    Consulted and Agree with Plan of Care  Patient       Patient will benefit from skilled therapeutic intervention in order to improve the following deficits and impairments:           Visit Diagnosis: Muscle weakness (generalized)    Problem List Patient Active Problem List   Diagnosis Date Noted  . E. coli UTI   . Diabetes mellitus  type 2 in obese ([place])   . Chronic pain of right knee   . 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 Hemiparesis affecting left side as late effect of stroke ([place])   . Acute ischemic right middle cerebral artery (MCA) stroke ([place]) 07/22/2019  . Carotid artery dissection  (HCC) s/p stent placement 07/21/2019  . Diabetes mellitus type II, uncontrolled ([place]) 07/21/2019  . Acute blood loss anemia 07/21/2019  . Leukocytosis 07/21/2019  . Stroke (cerebrum) ([place]) - R MCA infarct s/p tenecteplase and mechanical thrombectomy w/ d/t ICA dissection  07/15/2019  . Middle cerebral artery embolism, right 07/15/2019  . Controlled type 2 diabetes mellitus without complication, without long-term current use of insulin ([place]) 05/28/2017  . Attention deficit hyperactivity disorder (ADHD), combined type 04/18/2017  . Morbid obesity ([place]) 01/25/2016  . Umbilical hernia [id number]  . Hiatal hernia 12/08/2015  . History of Clostridium difficile colitis 06/19/2014  . HTN (hypertension) 12/23/2013  . PCOS (polycystic ovarian syndrome) 12/23/2013  . Bariatric surgery status 01/28/2013    Kathryn Carina, MS, OTR/L 09/22/2019, 5:45 PM  [place] MAIN [facility] SERVICES [address] [place], [place], [address] Phone: [phone]   Fax:  [phone]  Name: Kathryn Coffey MRN: [id number] Date of Birth: 1971/05/09

## 2019-09-23 NOTE — Telephone Encounter (Signed)
Message from patient

## 2019-09-24 ENCOUNTER — Other Ambulatory Visit: Payer: Self-pay

## 2019-09-24 ENCOUNTER — Ambulatory Visit: Payer: No Typology Code available for payment source | Admitting: Occupational Therapy

## 2019-09-24 ENCOUNTER — Ambulatory Visit: Payer: No Typology Code available for payment source

## 2019-09-24 ENCOUNTER — Encounter: Payer: Self-pay | Admitting: Occupational Therapy

## 2019-09-24 DIAGNOSIS — R278 Other lack of coordination: Secondary | ICD-10-CM | POA: Diagnosis present

## 2019-09-24 DIAGNOSIS — R262 Difficulty in walking, not elsewhere classified: Secondary | ICD-10-CM | POA: Diagnosis present

## 2019-09-24 DIAGNOSIS — I63511 Cerebral infarction due to unspecified occlusion or stenosis of right middle cerebral artery: Secondary | ICD-10-CM

## 2019-09-24 DIAGNOSIS — R2689 Other abnormalities of gait and mobility: Secondary | ICD-10-CM

## 2019-09-24 DIAGNOSIS — M6281 Muscle weakness (generalized): Secondary | ICD-10-CM | POA: Diagnosis not present

## 2019-09-24 NOTE — Therapy (Signed)
[place] MAIN [facility] SERVICES [address] [place], [place], [address] Phone: [phone]   Fax:  [phone]  Physical Therapy Treatment  Patient Details  Name: Kathryn Coffey MRN: [id number] Date of Birth: [birth year] No data recorded  Encounter Date: 09/24/2019  PT End of Session - 09/24/19 1649    Visit Number  4    Number of Visits  36    Date for PT Re-Evaluation  11/03/19    PT Start Time  1514    PT Stop Time  1600    PT Time Calculation (min)  46 min    Equipment Utilized During Treatment  Gait belt    Activity Tolerance  Patient tolerated treatment well    Behavior During Therapy  WFL for tasks assessed/performed       Past Medical History:  Diagnosis Date  . Abdominal wall cellulitis 2013 and 2014  . Asthma    allergic to grasses  . B12 deficiency   . Complication of anesthesia   . Costochondritis   . Diabetes mellitus without complication ([place])   . Gastric anomaly    multiple small ulcers  . GERD (gastroesophageal reflux disease)   . Herpes 06/2018   POSSIBLY IN EYE- PT TAKING VALTREX AND WILL SEE OPTHAMOLOGIST TO SEE IF VALTREX IS WORKING  . History of abnormal cervical Pap smear   . History of Clostridium difficile colitis   . History of hiatal hernia   . History of kidney stones   . Hypertension   . IBS (irritable bowel syndrome)   . Migraine   . Morbid obesity ([place])    s/p attempted gastric banding now decompressed  . PCOS (polycystic ovarian syndrome)   . PONV (postoperative nausea and vomiting)    WITH SPINAL ONLY  . Stroke [facility])     Past Surgical History:  Procedure Laterality Date  . BREAST EXCISIONAL BIOPSY Left    cyst excision - Dr. Patty Sermons  . CESAREAN SECTION    . CHOLECYSTECTOMY    . COLONOSCOPY    . EAR CYST EXCISION Right 07/04/2018   Procedure: EXCISION GLOMUS TUMOR THUMBNAIL;  Surgeon: Corky Mull, MD;  Location: [facility] ORS;  Service: Orthopedics;  Laterality: Right;  .  ESOPHAGOGASTRODUODENOSCOPY    . ESOPHAGOGASTRODUODENOSCOPY (EGD) WITH PROPOFOL N/A 12/06/2015   Procedure: ESOPHAGOGASTRODUODENOSCOPY (EGD) WITH PROPOFOL;  Surgeon: Manya Silvas, MD;  Location: [facility] ENDOSCOPY;  Service: Endoscopy;  Laterality: N/A;  . IR ANGIO INTRA EXTRACRAN SEL INTERNAL CAROTID UNI L MOD SED  07/15/2019  . IR ANGIO VERTEBRAL SEL SUBCLAVIAN INNOMINATE UNI R MOD SED  07/15/2019  . IR CT HEAD LTD  07/15/2019  . IR INTRAVSC STENT CERV CAROTID W/O EMB-PROT MOD SED INC ANGIO  07/15/2019  . IR PERCUTANEOUS ART THROMBECTOMY/INFUSION INTRACRANIAL INC DIAG ANGIO  07/15/2019  . LAPAROSCOPIC GASTRIC BANDING    . LAPAROSCOPIC GASTRIC RESTRICTIVE DUODENAL PROCEDURE (DUODENAL SWITCH) N/A 01/25/2016   Procedure: LAPAROSCOPIC GASTRIC RESTRICTIVE DUODENAL PROCEDURE (DUODENAL SWITCH);  Surgeon: Ladora Daniel, MD;  Location: [facility] ORS;  Service: General;  Laterality: N/A;  . OVARY SURGERY     x2  . RADIOLOGY WITH ANESTHESIA N/A 07/15/2019   Procedure: IR WITH ANESTHESIA;  Surgeon: Luanne Bras, MD;  Location: [place];  Service: Radiology;  Laterality: N/A;  . TONSILLECTOMY    . TUBAL LIGATION    . UMBILICAL HERNIA REPAIR N/A 01/25/2016   Procedure: LAPAROSCOPIC UMBILICAL HERNIA;  Surgeon: Ladora Daniel, MD;  Location: [facility] ORS;  Service: General;  Laterality: N/A;  . UMBILICAL HERNIA REPAIR N/A 10/20/2016   Procedure: HERNIA REPAIR UMBILICAL ADULT;  Surgeon: Leonie Green, MD;  Location: [facility] ORS;  Service: General;  Laterality: N/A;    There were no vitals filed for this visit.  Subjective Assessment - 09/24/19 1648    Subjective  Patient reports having talked to the physician about her imaging with tear of left bicep muscle. Will be getting an MRI and meeting with orthopedic surgeon about plan of care. Is told to keep it as immobile as possible.    Pertinent History  Patient was in the hospital [place] and then in patient rehab 3 weeks. She was dicharged 08/13/19 and then  HHPT and was discharged 09/03/19. She has not had any falls.    Currently in Pain?  Yes    Pain Score  5     Pain Location  Shoulder    Pain Orientation  Left    Pain Descriptors / Indicators  Aching    Pain Type  Acute pain    Pain Onset  More than a month ago    Pain Frequency  Constant      Patient reports having talked to the physician about her imaging with tear of left bicep muscle. Will be getting an MRI and meeting with orthopedic surgeon about plan of care. Is told to keep it as immobile as possible.   Supine:  2.5 ankle weight SLR 10x tactile cueing to external portion of foot for neutral alignment  2.5 ankle weight SAQ over bolster LLE 15x [place] 12x with PT stabilization to feet. Cueing for posterior pelvic tilt, gluteal squeeze, then press through LE's.  Contract relax L leg into PT shoulder 10x 5 seconds.   Seated: 6" step toe taps 30 seconds for coordination, spatial awareness, muscle recruitment activation timing, more challenging with LLE.  RTB hamstring curl with focus on slow activation 12x LLE.  Soccer ball hamstring curl with mod A for positioning of ball under foot LLE alphabet ; challenging Sit to stand with 6" step under RLE for weight shift onto LLE 10x, 2 sets  Standing: Knee hyperextension RTB resistance end range extension 15x with SPT holding band and PT providing Static stand with RLE on 6" step for weight shift onto LLE, PT guidance with use of mirror 10 seconds, x 3 trials Static stand on airex pad: RUE support weight shifts "booty bump" laterally side to side 60 seconds.  Toe taps with RUE support tapping spot on 7" step for coordination and spatial awareness.    Walking with cane. L UE tucked into belt. Noticeable foot slap with knee hyperextension with weightbearing.  30 ft x 2 trials        Pt educated throughout session about proper posture and technique with exercises. Improved exercise technique, movement at target joints, use of target  muscles after min to mod verbal, visual, tactile cues.                        PT Education - 09/24/19 1648    Education Details  exercise technique, weight shift/weight acceptance LLE    Person(s) Educated  Patient    Methods  Explanation;Demonstration;Tactile cues;Verbal cues    Comprehension  Verbalized understanding;Returned demonstration;Verbal cues required;Tactile cues required       PT Short Term Goals - 09/08/19 1218      PT SHORT TERM GOAL #1   Title  Patient will be independent  in home exercise program to improve strength/mobility for better functional independence with ADLs.    Time  6    Period  Weeks    Status  New    Target Date  10/20/19      PT SHORT TERM GOAL #2   Title  Patient (> 74 years old) will complete five times sit to stand test in < 15 seconds indicating an increased LE strength and improved balance.    Time  6    Period  Weeks    Status  New    Target Date  10/20/19        PT Long Term Goals - 09/08/19 1222      PT LONG TERM GOAL #1   Title  Patient will increase Berg Balance score by > 6 points to demonstrate decreased fall risk during functional activities.    Time  12    Period  Weeks    Status  New    Target Date  12/01/19      PT LONG TERM GOAL #2   Title  Patient will increase six minute walk test distance to >1200 for progression to community ambulator and improve gait ability    Time  12    Period  Weeks    Status  New    Target Date  12/01/19      PT LONG TERM GOAL #3   Title  Patient will increase 10 meter walk test to >1.91m/s as to improve gait speed for better community ambulation and to reduce fall risk    Time  12    Period  Weeks    Status  New    Target Date  12/01/19            Plan - 09/24/19 1654    Clinical Impression Statement  Patient presents to physical therapy with excellent motivation. Awaiting MRI and meeting with orthopedic surgeon for plan of care in regards to bicep tendon/UE.  She demonstrates limited weight acceptance onto LLE with episodes of knee hyperextension upon weight acceptance indicating instability and need for continued strengthening of LLE. Patient demonstrates continued progression of mobility of LLE with increased ankle active range of motion that fatigues quickly with repeated motion. Pt will benefit from PT services to address deficits in strength, balance, and mobility in order to return to full function at home.    Personal Factors and Comorbidities  Comorbidity 1    Comorbidities  diabetes, high blood pressure, reflux,    Examination-Activity Limitations  Carry;Caring for Others;Bend;Locomotion Level;Self Feeding;Sleep;Toileting    Examination-Participation Restrictions  Cleaning;Community Activity;Driving;Laundry;Shop    Stability/Clinical Decision Making  Stable/Uncomplicated    Rehab Potential  Good    PT Frequency  2x / week    PT Duration  12 weeks    PT Treatment/Interventions  Manual techniques;Functional mobility training;Stair training;Gait training;Electrical Stimulation;Moist Heat;Ultrasound;Therapeutic exercise;Balance training;Neuromuscular re-education    PT Next Visit Plan  strengthening and balance    PT Home Exercise Plan  standing hip abd,    Consulted and Agree with Plan of Care  Family member/caregiver;Patient       Patient will benefit from skilled therapeutic intervention in order to improve the following deficits and impairments:  Abnormal gait, Decreased knowledge of use of DME, Dizziness, Pain, Decreased coordination, Impaired UE functional use, Decreased strength, Decreased endurance, Decreased activity tolerance, Decreased balance, Difficulty walking, Obesity  Visit Diagnosis: Muscle weakness (generalized)  Difficulty in walking, not elsewhere classified  Other abnormalities of gait  and mobility  Acute ischemic right middle cerebral artery (MCA) stroke [facility])     Problem List Patient Active Problem List    Diagnosis Date Noted  . E. coli UTI   . Diabetes mellitus type 2 in obese ([place])   . Chronic pain of right knee   . 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 Hemiparesis affecting left side as late effect of stroke ([place])   . Acute ischemic right middle cerebral artery (MCA) stroke ([place]) 07/22/2019  . Carotid artery dissection  (HCC) s/p stent placement 07/21/2019  . Diabetes mellitus type II, uncontrolled ([place]) 07/21/2019  . Acute blood loss anemia 07/21/2019  . Leukocytosis 07/21/2019  . Stroke (cerebrum) ([place]) - R MCA infarct s/p tenecteplase and mechanical thrombectomy w/ d/t ICA dissection  07/15/2019  . Middle cerebral artery embolism, right 07/15/2019  . Controlled type 2 diabetes mellitus without complication, without long-term current use of insulin ([place]) 05/28/2017  . Attention deficit hyperactivity disorder (ADHD), combined type 04/18/2017  . Morbid obesity ([place]) 01/25/2016  . Umbilical hernia [id number]  . Hiatal hernia 12/08/2015  . History of Clostridium difficile colitis 06/19/2014  . HTN (hypertension) 12/23/2013  . PCOS (polycystic ovarian syndrome) 12/23/2013  . Bariatric surgery status 01/28/2013   Janna Arch, PT, DPT   09/24/2019, 4:55 PM  [place] MAIN [facility] SERVICES [address] [place], [place], [address] Phone: [phone]   Fax:  [phone]  Name: Kathryn Coffey MRN: [id number] Date of Birth: 1971/07/11

## 2019-09-24 NOTE — Therapy (Signed)
[place] MAIN [facility] SERVICES [address] [place], [place], [address] Phone: [phone]   Fax:  [phone]  Occupational Therapy Treatment  Patient Details  Name: Kathryn Coffey MRN: [id number] Date of Birth: 1971/04/21 Referring Provider (OT): Raulkar   Encounter Date: 09/24/2019  OT End of Session - 09/24/19 1740    Visit Number  3    Number of Visits  24    Date for OT Re-Evaluation  12/01/19    Authorization Type  Progress report period starting  on 09/08/2019    Authorization Time Period  FOTO on the 6th, 12th, 18th, 24th, 30th, and 36th visit.    OT Start Time  1602    OT Stop Time  1645    OT Time Calculation (min)  43 min    Activity Tolerance  Patient tolerated treatment well    Behavior During Therapy  WFL for tasks assessed/performed       Past Medical History:  Diagnosis Date  . Abdominal wall cellulitis 2013 and 2014  . Asthma    allergic to grasses  . B12 deficiency   . Complication of anesthesia   . Costochondritis   . Diabetes mellitus without complication ([place])   . Gastric anomaly    multiple small ulcers  . GERD (gastroesophageal reflux disease)   . Herpes 06/2018   POSSIBLY IN EYE- PT TAKING VALTREX AND WILL SEE OPTHAMOLOGIST TO SEE IF VALTREX IS WORKING  . History of abnormal cervical Pap smear   . History of Clostridium difficile colitis   . History of hiatal hernia   . History of kidney stones   . Hypertension   . IBS (irritable bowel syndrome)   . Migraine   . Morbid obesity ([place])    s/p attempted gastric banding now decompressed  . PCOS (polycystic ovarian syndrome)   . PONV (postoperative nausea and vomiting)    WITH SPINAL ONLY  . Stroke [facility])     Past Surgical History:  Procedure Laterality Date  . BREAST EXCISIONAL BIOPSY Left    cyst excision - Dr. Patty Sermons  . CESAREAN SECTION    . CHOLECYSTECTOMY    . COLONOSCOPY    . EAR CYST EXCISION Right 07/04/2018   Procedure: EXCISION GLOMUS  TUMOR THUMBNAIL;  Surgeon: Corky Mull, MD;  Location: [facility] ORS;  Service: Orthopedics;  Laterality: Right;  . ESOPHAGOGASTRODUODENOSCOPY    . ESOPHAGOGASTRODUODENOSCOPY (EGD) WITH PROPOFOL N/A 12/06/2015   Procedure: ESOPHAGOGASTRODUODENOSCOPY (EGD) WITH PROPOFOL;  Surgeon: Manya Silvas, MD;  Location: [facility] ENDOSCOPY;  Service: Endoscopy;  Laterality: N/A;  . IR ANGIO INTRA EXTRACRAN SEL INTERNAL CAROTID UNI L MOD SED  07/15/2019  . IR ANGIO VERTEBRAL SEL SUBCLAVIAN INNOMINATE UNI R MOD SED  07/15/2019  . IR CT HEAD LTD  07/15/2019  . IR INTRAVSC STENT CERV CAROTID W/O EMB-PROT MOD SED INC ANGIO  07/15/2019  . IR PERCUTANEOUS ART THROMBECTOMY/INFUSION INTRACRANIAL INC DIAG ANGIO  07/15/2019  . LAPAROSCOPIC GASTRIC BANDING    . LAPAROSCOPIC GASTRIC RESTRICTIVE DUODENAL PROCEDURE (DUODENAL SWITCH) N/A 01/25/2016   Procedure: LAPAROSCOPIC GASTRIC RESTRICTIVE DUODENAL PROCEDURE (DUODENAL SWITCH);  Surgeon: Ladora Daniel, MD;  Location: [facility] ORS;  Service: General;  Laterality: N/A;  . OVARY SURGERY     x2  . RADIOLOGY WITH ANESTHESIA N/A 07/15/2019   Procedure: IR WITH ANESTHESIA;  Surgeon: Luanne Bras, MD;  Location: [place];  Service: Radiology;  Laterality: N/A;  . TONSILLECTOMY    . TUBAL LIGATION    .  UMBILICAL HERNIA REPAIR N/A 01/25/2016   Procedure: LAPAROSCOPIC UMBILICAL HERNIA;  Surgeon: Ladora Daniel, MD;  Location: [facility] ORS;  Service: General;  Laterality: N/A;  . UMBILICAL HERNIA REPAIR N/A 10/20/2016   Procedure: HERNIA REPAIR UMBILICAL ADULT;  Surgeon: Leonie Green, MD;  Location: [facility] ORS;  Service: General;  Laterality: N/A;    There were no vitals filed for this visit.  Subjective Assessment - 09/24/19 1736    Subjective   Pt. reports the she heard back from her Physician    Patient is accompanied by:  Family member    Pertinent History  Per pt. chart. Pt. is a 49 y.o. female with history of HTN, T2DM, obesity who was admitted on 07/15/19 with onset of HA  progressing to flaccid left hemiparesis a few hours later.  CT head showed acute nonhemorrhagic infarct in right basal ganglia with hyperdense right MCA sign.  CT A/P head neck showed perfusion deficit with occluded right ICA and slow flow in M1.  She underwent cerebral angiogram with revascularization of right MCA with T1C1 revascularization and repair of right ICA with flow diverter device. Stroke felt to be embolic likely due to ICA dissection. Postprocedure head CT was negative for bleed repeat CTA head neck showed reocclusion of right ICA origin and occlusion of right ICA stent but now with patent right MCA. Pt. received inpatient rehab services for 3 weeks, and home health services.    Limitations  LUE AROM    Currently in Pain?  No/denies      OT TREATMENT    Therapeutic Exercise:   Pt. worked on facilitation of active forearm supination, wrist, and digit extension, thumb palmar abduction, and adduction. Pt. was able to achieve consistent digit extension actively 25 degrees, wrist extension actively to -40 from neutral, and active forearm supination. Pt. worked while seated with the w/c support tray under her left UE.  Manual Therapy:  Pt. Tolerated soft tissues mobilizations in the wrist for carpal rolls, and metacarpal spread stretches independent of, and in preparation for ther. Ex. of the wrist, and digits.   Response to Treatment:  Pt. reports that her Physician followed up with her, and is scheduling her for an MRI. Pt. will then follow up with an orthopedic Physician to determine if surgical intervention is needed. Pt. was able to actively able to achieve consistent left wrist extension, thumb radial abduction, and forearm supination with support. Pt. continues to work on facilitating active motion distally in her left UE in order to increase engagement in ADLs, and IADL tasks.                     OT Education - 09/24/19 1739    Education Details  Left wrist  positioning for active movement.    Person(s) Educated  Patient;Spouse    Methods  Demonstration    Comprehension  Returned demonstration;Verbalized understanding          OT Long Term Goals - 09/08/19 1402      OT LONG TERM GOAL #1   Title  Pt. will increase left shoulder flexion PROM by 20 degrees to assist with UE dressing    Baseline  Eval: Shoulder flexion PROM 80 degrees    Time  12    Period  Weeks    Status  New    Target Date  12/01/19      OT LONG TERM GOAL #2   Title  Pt. will be able to actively stabilize  left shoulder for 10 seconds in supine with shoulde flexed to 90 degrees, and elbow in extension in preparation for functional reaching.    Baseline  Eval: Pt.is unable to stabilize left shoulder    Time  12    Period  Weeks    Status  New    Target Date  12/01/19      OT LONG TERM GOAL #3   Title  Pt. will independently perform hand to face patterns with the LUE in preparation for light self grooming tasks.    Baseline  Eval: Pt. is unable to perform hand to face patterns with the LUE.    Time  12    Period  Weeks    Status  New    Target Date  12/01/19      OT LONG TERM GOAL #4   Title  Pt. will initiate 10 degrees of active left wrist extension  in preparation for actively lifting her hand off of a surface.    Baseline  Eval: Pt. is unable    Time  12    Period  Weeks    Status  New    Target Date  12/01/19      OT LONG TERM GOAL #5   Title  Pt. will initiate left hand digit extension in order to be able to actively release an objects during ADL tasks.    Baseline  Eval: Pt. is unable    Time  12    Period  Weeks    Status  New    Target Date  12/01/19      Long Term Additional Goals   Additional Long Term Goals  Yes      OT LONG TERM GOAL #6   Title  Pt. increase left hand digit extension in order to be able to hold her toothbrush while applying toothpaste with her left right hand.    Baseline  Eval: Pt. is unable    Time  12    Period   Weeks    Status  New    Target Date  12/01/19      OT LONG TERM GOAL #7   Title  --            Plan - 09/24/19 1740    Clinical Impression Statement Pt. reports that her Physician followed up with her, and is scheduling her for an MRI. Pt. will then follow up with an orthopedic Physician to determine if surgical intervention is needed. Pt. was able to actively able to achieve consistent left wrist extension, thumb radial abduction, and forearm supination with support. Pt. continues to work on facilitating active motion distally in her left UE in order to increase engagement in ADLs, and IADL tasks.    Occupational performance deficits (Please refer to evaluation for details):  ADL's;IADL's    Rehab Potential  Good    Clinical Decision Making  Several treatment options, min-mod task modification necessary    Comorbidities Affecting Occupational Performance:  May have comorbidities impacting occupational performance    Modification or Assistance to Complete Evaluation   Min-Moderate modification of tasks or assist with assess necessary to complete eval    OT Frequency  2x / week    OT Duration  12 weeks    OT Treatment/Interventions  Self-care/ADL training;Moist Heat;DME and/or AE instruction;Neuromuscular education;Therapeutic exercise;Therapeutic activities;Energy conservation;Patient/family education    Consulted and Agree with Plan of Care  Patient       Patient will benefit from  skilled therapeutic intervention in order to improve the following deficits and impairments:           Visit Diagnosis: Muscle weakness (generalized)    Problem List Patient Active Problem List   Diagnosis Date Noted  . E. coli UTI   . Diabetes mellitus type 2 in obese ([place])   . Chronic pain of right knee   . 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 Hemiparesis affecting left side as late effect of stroke ([place])   . Acute ischemic right middle cerebral artery (MCA) stroke ([place])  07/22/2019  . Carotid artery dissection  (HCC) s/p stent placement 07/21/2019  . Diabetes mellitus type II, uncontrolled ([place]) 07/21/2019  . Acute blood loss anemia 07/21/2019  . Leukocytosis 07/21/2019  . Stroke (cerebrum) ([place]) - R MCA infarct s/p tenecteplase and mechanical thrombectomy w/ d/t ICA dissection  07/15/2019  . Middle cerebral artery embolism, right 07/15/2019  . Controlled type 2 diabetes mellitus without complication, without long-term current use of insulin ([place]) 05/28/2017  . Attention deficit hyperactivity disorder (ADHD), combined type 04/18/2017  . Morbid obesity ([place]) 01/25/2016  . Umbilical hernia [id number]  . Hiatal hernia 12/08/2015  . History of Clostridium difficile colitis 06/19/2014  . HTN (hypertension) 12/23/2013  . PCOS (polycystic ovarian syndrome) 12/23/2013  . Bariatric surgery status 01/28/2013    Harrel Carina, MS, OTR/L 09/24/2019, 5:47 PM  [place] MAIN [facility] SERVICES [address] [place], [place], [address] Phone: [phone]   Fax:  [phone]  Name: MERTICE DEMARIO MRN: [id number] Date of Birth: 25-Jul-1971

## 2019-09-25 NOTE — Progress Notes (Signed)
Kindly inform pt that lipid profile is satisfactory. Diabetes control is borderline but improved from before

## 2019-09-29 ENCOUNTER — Other Ambulatory Visit: Payer: Self-pay

## 2019-09-29 MED ORDER — TIZANIDINE HCL 4 MG PO TABS: 4.0000 mg | ORAL_TABLET | Freq: Every day | ORAL | 2 refills | Status: DC

## 2019-09-30 ENCOUNTER — Encounter: Payer: Self-pay | Admitting: Occupational Therapy

## 2019-09-30 ENCOUNTER — Ambulatory Visit: Payer: No Typology Code available for payment source | Admitting: Occupational Therapy

## 2019-09-30 ENCOUNTER — Ambulatory Visit: Payer: No Typology Code available for payment source

## 2019-09-30 ENCOUNTER — Other Ambulatory Visit: Payer: Self-pay

## 2019-09-30 DIAGNOSIS — R262 Difficulty in walking, not elsewhere classified: Secondary | ICD-10-CM

## 2019-09-30 DIAGNOSIS — M6281 Muscle weakness (generalized): Secondary | ICD-10-CM | POA: Diagnosis not present

## 2019-09-30 DIAGNOSIS — R2689 Other abnormalities of gait and mobility: Secondary | ICD-10-CM

## 2019-09-30 NOTE — Therapy (Signed)
[place] MAIN [facility] SERVICES [address] [place], [place], [address] Phone: [phone]   Fax:  [phone]  Physical Therapy Treatment  Patient Details  Name: Kathryn Coffey MRN: [id number] Date of Birth: 1970-11-02 No data recorded  Encounter Date: 09/30/2019  PT End of Session - 09/30/19 1556    Visit Number  5    Number of Visits  36    Date for PT Re-Evaluation  11/03/19    PT Start Time  1600    PT Stop Time  1644    PT Time Calculation (min)  44 min    Equipment Utilized During Treatment  Gait belt    Activity Tolerance  Patient tolerated treatment well    Behavior During Therapy  WFL for tasks assessed/performed       Past Medical History:  Diagnosis Date  . Abdominal wall cellulitis 2013 and 2014  . Asthma    allergic to grasses  . B12 deficiency   . Complication of anesthesia   . Costochondritis   . Diabetes mellitus without complication ([place])   . Gastric anomaly    multiple small ulcers  . GERD (gastroesophageal reflux disease)   . Herpes 06/2018   POSSIBLY IN EYE- PT TAKING VALTREX AND WILL SEE OPTHAMOLOGIST TO SEE IF VALTREX IS WORKING  . History of abnormal cervical Pap smear   . History of Clostridium difficile colitis   . History of hiatal hernia   . History of kidney stones   . Hypertension   . IBS (irritable bowel syndrome)   . Migraine   . Morbid obesity ([place])    s/p attempted gastric banding now decompressed  . PCOS (polycystic ovarian syndrome)   . PONV (postoperative nausea and vomiting)    WITH SPINAL ONLY  . Stroke [facility])     Past Surgical History:  Procedure Laterality Date  . BREAST EXCISIONAL BIOPSY Left    cyst excision - Dr. Patty Sermons  . CESAREAN SECTION    . CHOLECYSTECTOMY    . COLONOSCOPY    . EAR CYST EXCISION Right 07/04/2018   Procedure: EXCISION GLOMUS TUMOR THUMBNAIL;  Surgeon: Corky Mull, MD;  Location: [facility] ORS;  Service: Orthopedics;  Laterality: Right;  .  ESOPHAGOGASTRODUODENOSCOPY    . ESOPHAGOGASTRODUODENOSCOPY (EGD) WITH PROPOFOL N/A 12/06/2015   Procedure: ESOPHAGOGASTRODUODENOSCOPY (EGD) WITH PROPOFOL;  Surgeon: Manya Silvas, MD;  Location: [facility] ENDOSCOPY;  Service: Endoscopy;  Laterality: N/A;  . IR ANGIO INTRA EXTRACRAN SEL INTERNAL CAROTID UNI L MOD SED  07/15/2019  . IR ANGIO VERTEBRAL SEL SUBCLAVIAN INNOMINATE UNI R MOD SED  07/15/2019  . IR CT HEAD LTD  07/15/2019  . IR INTRAVSC STENT CERV CAROTID W/O EMB-PROT MOD SED INC ANGIO  07/15/2019  . IR PERCUTANEOUS ART THROMBECTOMY/INFUSION INTRACRANIAL INC DIAG ANGIO  07/15/2019  . LAPAROSCOPIC GASTRIC BANDING    . LAPAROSCOPIC GASTRIC RESTRICTIVE DUODENAL PROCEDURE (DUODENAL SWITCH) N/A 01/25/2016   Procedure: LAPAROSCOPIC GASTRIC RESTRICTIVE DUODENAL PROCEDURE (DUODENAL SWITCH);  Surgeon: Ladora Daniel, MD;  Location: [facility] ORS;  Service: General;  Laterality: N/A;  . OVARY SURGERY     x2  . RADIOLOGY WITH ANESTHESIA N/A 07/15/2019   Procedure: IR WITH ANESTHESIA;  Surgeon: Luanne Bras, MD;  Location: [place];  Service: Radiology;  Laterality: N/A;  . TONSILLECTOMY    . TUBAL LIGATION    . UMBILICAL HERNIA REPAIR N/A 01/25/2016   Procedure: LAPAROSCOPIC UMBILICAL HERNIA;  Surgeon: Ladora Daniel, MD;  Location: [facility] ORS;  Service: General;  Laterality: N/A;  . UMBILICAL HERNIA REPAIR N/A 10/20/2016   Procedure: HERNIA REPAIR UMBILICAL ADULT;  Surgeon: Leonie Green, MD;  Location: [facility] ORS;  Service: General;  Laterality: N/A;    There were no vitals filed for this visit.  Subjective Assessment - 10/01/19 0813    Subjective  Patient reports compliance with HEP. Has been walking in her kitchen with decreasing use of AD. Received her COVID vaccine today and is feeling a little fatigued and warm.    Pertinent History  Patient was in the hospital [facility] and then in patient rehab 3 weeks. She was dicharged 08/13/19 and then HHPT and was discharged 09/03/19. She has not had  any falls.    Currently in Pain?  No/denies    Pain Onset  More than a month ago           In // bars: CGA for stability and safety awareness for decreased fall risk. Cueing for task orientation and body mechanics.   Airex pad: horizontal head turns 45 seconds, static stand 60 seconds one LOB   One foot on ground one foot on 6" step, static hold 30 seconds x 2 trials each LE  6" step toe taps onto sticky note for coordination/spatial awareness. 10x each LE   Non affected limb RLE on dynasic for weight shift onto affected left  leg 60 second holds x 2 trials  speed ladder one foot in each box for increased step length with decreasing UE support to single finger tip support 10x length of // bars with improved weight shift onto left LE.   1.5 ankle weight lateral stepping 4x length of // bars. Cueing for foot clearance bilaterally, challenging to LLE.     Seated: cues for body mechanics and neutral alignment   RTB resisted marches LLE 15x  RTB hamstring curl 15x LLE  RTB adduction against PT resistance 15x LLE RTB abduction against PT resistance 15x LLE RTB df 15x LLE RTB Inv 15x LLE RTB EV 15x LLE   Use of ice pack to cool body temperature down between sets of interventions performed.    Pt educated throughout session about proper posture and technique with exercises. Improved exercise technique, movement at target joints, use of target muscles after min to mod verbal, visual, tactile cues.          PT Education - 09/30/19 1555    Education Details  exercise technique, body mechanics, weight shift    Person(s) Educated  Patient    Methods  Explanation;Demonstration;Tactile cues;Verbal cues    Comprehension  Verbalized understanding;Returned demonstration;Verbal cues required;Tactile cues required       PT Short Term Goals - 09/08/19 1218      PT SHORT TERM GOAL #1   Title  Patient will be independent in home exercise program to improve strength/mobility for  better functional independence with ADLs.    Time  6    Period  Weeks    Status  New    Target Date  10/20/19      PT SHORT TERM GOAL #2   Title  Patient (> 79 years old) will complete five times sit to stand test in < 15 seconds indicating an increased LE strength and improved balance.    Time  6    Period  Weeks    Status  New    Target Date  10/20/19        PT Long Term Goals - 09/08/19 1222  PT LONG TERM GOAL #1   Title  Patient will increase Berg Balance score by > 6 points to demonstrate decreased fall risk during functional activities.    Time  12    Period  Weeks    Status  New    Target Date  12/01/19      PT LONG TERM GOAL #2   Title  Patient will increase six minute walk test distance to >1200 for progression to community ambulator and improve gait ability    Time  12    Period  Weeks    Status  New    Target Date  12/01/19      PT LONG TERM GOAL #3   Title  Patient will increase 10 meter walk test to >1.7m/s as to improve gait speed for better community ambulation and to reduce fall risk    Time  12    Period  Weeks    Status  New    Target Date  12/01/19            Plan - 10/01/19 [id number]    Clinical Impression Statement  Patient presents with excellent motivation. Progression of dynamic stability and mobility performed with more use of standing interventions than previous sessions. Introduction of ankle weight in standing performed with patient fatiguing quickly with good weight shift with UE support. Occasional instances of knee hyperextension of LLE upon weightbearing performed. Pt will benefit from PT services to address deficits in strength, balance, and mobility in order to return to full function at home.    Personal Factors and Comorbidities  Comorbidity 1    Comorbidities  diabetes, high blood pressure, reflux,    Examination-Activity Limitations  Carry;Caring for Others;Bend;Locomotion Level;Self Feeding;Sleep;Toileting     Examination-Participation Restrictions  Cleaning;Community Activity;Driving;Laundry;Shop    Stability/Clinical Decision Making  Stable/Uncomplicated    Rehab Potential  Good    PT Frequency  2x / week    PT Duration  12 weeks    PT Treatment/Interventions  Manual techniques;Functional mobility training;Stair training;Gait training;Electrical Stimulation;Moist Heat;Ultrasound;Therapeutic exercise;Balance training;Neuromuscular re-education    PT Next Visit Plan  strengthening and balance    PT Home Exercise Plan  standing hip abd,    Consulted and Agree with Plan of Care  Family member/caregiver;Patient       Patient will benefit from skilled therapeutic intervention in order to improve the following deficits and impairments:  Abnormal gait, Decreased knowledge of use of DME, Dizziness, Pain, Decreased coordination, Impaired UE functional use, Decreased strength, Decreased endurance, Decreased activity tolerance, Decreased balance, Difficulty walking, Obesity  Visit Diagnosis: Muscle weakness (generalized)  Difficulty in walking, not elsewhere classified  Other abnormalities of gait and mobility     Problem List Patient Active Problem List   Diagnosis Date Noted  . E. coli UTI   . Diabetes mellitus type 2 in obese ([place])   . Chronic pain of right knee   . 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 Hemiparesis affecting left side as late effect of stroke ([place])   . Acute ischemic right middle cerebral artery (MCA) stroke ([place]) 07/22/2019  . Carotid artery dissection  (HCC) s/p stent placement 07/21/2019  . Diabetes mellitus type II, uncontrolled ([place]) 07/21/2019  . Acute blood loss anemia 07/21/2019  . Leukocytosis 07/21/2019  . Stroke (cerebrum) ([place]) - R MCA infarct s/p tenecteplase and mechanical thrombectomy w/ d/t ICA dissection  07/15/2019  . Middle cerebral artery embolism, right 07/15/2019  . Controlled type 2 diabetes mellitus  without complication, without long-term  current use of insulin ([place]) 05/28/2017  . Attention deficit hyperactivity disorder (ADHD), combined type 04/18/2017  . Morbid obesity ([place]) 01/25/2016  . Umbilical hernia [id number]  . Hiatal hernia 12/08/2015  . History of Clostridium difficile colitis 06/19/2014  . HTN (hypertension) 12/23/2013  . PCOS (polycystic ovarian syndrome) 12/23/2013  . Bariatric surgery status 01/28/2013   Janna Arch, PT, DPT   10/01/2019, 8:22 AM  [place] MAIN [facility] SERVICES [address] [place], [place], [address] Phone: [phone]   Fax:  [phone]  Name: Kathryn Coffey MRN: [id number] Date of Birth: 10-Dec-1970

## 2019-09-30 NOTE — Therapy (Addendum)
[place] [facility] SERVICES [address] [place], [place], [address] Phone: [phone]   Fax:  [phone]  Occupational Therapy Treatment  Patient Details  Name: Kathryn Coffey MRN: [id number] Date of Birth: [birth year] Referring Provider (OT): Raulkar   Encounter Date: 09/30/2019  OT End of Session - 09/30/19 1751    Visit Number  4    Number of Visits  24    Date for OT Re-Evaluation  12/01/19    Authorization Type  Progress report period starting  on 09/08/2019    Authorization Time Period  FOTO on the 6th, 12th, 18th, 24th, 30th, and 36th visit.    OT Start Time  1645    OT Stop Time  1730    OT Time Calculation (min)  45 min    Activity Tolerance  Patient tolerated treatment well    Behavior During Therapy  WFL for tasks assessed/performed       Past Medical History:  Diagnosis Date  . Abdominal wall cellulitis 2013 and 2014  . Asthma    allergic to grasses  . B12 deficiency   . Complication of anesthesia   . Costochondritis   . Diabetes mellitus without complication ([place])   . Gastric anomaly    multiple small ulcers  . GERD (gastroesophageal reflux disease)   . Herpes 06/2018   POSSIBLY IN EYE- PT TAKING VALTREX AND WILL SEE OPTHAMOLOGIST TO SEE IF VALTREX IS WORKING  . History of abnormal cervical Pap smear   . History of Clostridium difficile colitis   . History of hiatal hernia   . History of kidney stones   . Hypertension   . IBS (irritable bowel syndrome)   . Migraine   . Morbid obesity ([place])    s/p attempted gastric banding now decompressed  . PCOS (polycystic ovarian syndrome)   . PONV (postoperative nausea and vomiting)    WITH SPINAL ONLY  . Stroke [facility])     Past Surgical History:  Procedure Laterality Date  . BREAST EXCISIONAL BIOPSY Left    cyst excision - Dr. Patty Sermons  . CESAREAN SECTION    . CHOLECYSTECTOMY    . COLONOSCOPY    . EAR CYST EXCISION Right 07/04/2018   Procedure: EXCISION GLOMUS  TUMOR THUMBNAIL;  Surgeon: Corky Mull, MD;  Location: [facility] ORS;  Service: Orthopedics;  Laterality: Right;  . ESOPHAGOGASTRODUODENOSCOPY    . ESOPHAGOGASTRODUODENOSCOPY (EGD) WITH PROPOFOL N/A 12/06/2015   Procedure: ESOPHAGOGASTRODUODENOSCOPY (EGD) WITH PROPOFOL;  Surgeon: Manya Silvas, MD;  Location: [facility] ENDOSCOPY;  Service: Endoscopy;  Laterality: N/A;  . IR ANGIO INTRA EXTRACRAN SEL INTERNAL CAROTID UNI L MOD SED  07/15/2019  . IR ANGIO VERTEBRAL SEL SUBCLAVIAN INNOMINATE UNI R MOD SED  07/15/2019  . IR CT HEAD LTD  07/15/2019  . IR INTRAVSC STENT CERV CAROTID W/O EMB-PROT MOD SED INC ANGIO  07/15/2019  . IR PERCUTANEOUS ART THROMBECTOMY/INFUSION INTRACRANIAL INC DIAG ANGIO  07/15/2019  . LAPAROSCOPIC GASTRIC BANDING    . LAPAROSCOPIC GASTRIC RESTRICTIVE DUODENAL PROCEDURE (DUODENAL SWITCH) N/A 01/25/2016   Procedure: LAPAROSCOPIC GASTRIC RESTRICTIVE DUODENAL PROCEDURE (DUODENAL SWITCH);  Surgeon: Ladora Daniel, MD;  Location: [facility] ORS;  Service: General;  Laterality: N/A;  . OVARY SURGERY     x2  . RADIOLOGY WITH ANESTHESIA N/A 07/15/2019   Procedure: IR WITH ANESTHESIA;  Surgeon: Luanne Bras, MD;  Location: [place];  Service: Radiology;  Laterality: N/A;  . TONSILLECTOMY    . TUBAL LIGATION    .  UMBILICAL HERNIA REPAIR N/A 01/25/2016   Procedure: LAPAROSCOPIC UMBILICAL HERNIA;  Surgeon: Ladora Daniel, MD;  Location: [facility] ORS;  Service: General;  Laterality: N/A;  . UMBILICAL HERNIA REPAIR N/A 10/20/2016   Procedure: HERNIA REPAIR UMBILICAL ADULT;  Surgeon: Leonie Green, MD;  Location: [facility] ORS;  Service: General;  Laterality: N/A;    There were no vitals filed for this visit.  Subjective Assessment - 09/30/19 1749    Subjective   Pt. reports that she received the 2nd dose of the vaccine today.    Patient is accompanied by:  Family member    Pertinent History  Per pt. chart. Pt. is a 49 y.o. female with history of HTN, T2DM, obesity who was admitted on 07/15/19 with  onset of HA progressing to flaccid left hemiparesis a few hours later.  CT head showed acute nonhemorrhagic infarct in right basal ganglia with hyperdense right MCA sign.  CT A/P head neck showed perfusion deficit with occluded right ICA and slow flow in M1.  She underwent cerebral angiogram with revascularization of right MCA with T1C1 revascularization and repair of right ICA with flow diverter device. Stroke felt to be embolic likely due to ICA dissection. Postprocedure head CT was negative for bleed repeat CTA head neck showed reocclusion of right ICA origin and occlusion of right ICA stent but now with patent right MCA. Pt. received inpatient rehab services for 3 weeks, and home health services.    Limitations  LUE AROM    Currently in Pain?  No/denies      OT TREATMENT    Therapeutic Exercise:  Pt. worked on facilitation of active forearm supination, wrist, and digit extension, thumb palmar abduction, and adduction. Pt. was able to achieve consistent triceps motion, wrist extension, active forearm supination, and thumb radial abduction. Pt. worked while seated with the w/c support tray under her left UE.  Response to Treatment:  Pt. reports that she has an MRI for her left shoulder scheduled for tomorrow night. Pt. reports that she has an appointment with the surgeon. Pt. reports having had the 2nd dose of the vaccine this afternoon. Pt. reported feeling flushed with her face feeling hot. Pt. appeared to have some flushing in her face, and neck. Pt. was using an ice pack secondary to being hot. Pt. tolerated the session well, and presented with active left elbow extension, wrist extension, thumb radial abduction, thumb IP flexion, and forearm supination. Pt. continues to work on facilitating active motion in her left forearm wrist, and digits.                       OT Education - 09/30/19 1751    Education Details  Left UE functioning    Person(s) Educated  Patient;Spouse     Methods  Demonstration    Comprehension  Returned demonstration;Verbalized understanding          OT Long Term Goals - 09/08/19 1402      OT LONG TERM GOAL #1   Title  Pt. will increase left shoulder flexion PROM by 20 degrees to assist with UE dressing    Baseline  Eval: Shoulder flexion PROM 80 degrees    Time  12    Period  Weeks    Status  New    Target Date  12/01/19      OT LONG TERM GOAL #2   Title  Pt. will be able to actively stabilize left shoulder for 10 seconds in supine  with shoulde flexed to 90 degrees, and elbow in extension in preparation for functional reaching.    Baseline  Eval: Pt.is unable to stabilize left shoulder    Time  12    Period  Weeks    Status  New    Target Date  12/01/19      OT LONG TERM GOAL #3   Title  Pt. will independently perform hand to face patterns with the LUE in preparation for light self grooming tasks.    Baseline  Eval: Pt. is unable to perform hand to face patterns with the LUE.    Time  12    Period  Weeks    Status  New    Target Date  12/01/19      OT LONG TERM GOAL #4   Title  Pt. will initiate 10 degrees of active left wrist extension  in preparation for actively lifting her hand off of a surface.    Baseline  Eval: Pt. is unable    Time  12    Period  Weeks    Status  New    Target Date  12/01/19      OT LONG TERM GOAL #5   Title  Pt. will initiate left hand digit extension in order to be able to actively release an objects during ADL tasks.    Baseline  Eval: Pt. is unable    Time  12    Period  Weeks    Status  New    Target Date  12/01/19      Long Term Additional Goals   Additional Long Term Goals  Yes      OT LONG TERM GOAL #6   Title  Pt. increase left hand digit flexion in order to be able to hold her toothbrush while applying toothpaste with her left right hand.    Baseline  Eval: Pt. is unable    Time  12    Period  Weeks    Status  New    Target Date  12/01/19      OT LONG TERM GOAL #7    Title  --            Plan - 09/30/19 1752    Clinical Impression Statement  Pt. reports that she has an MRI for her left shoulder scheduled for tomorrow night. Pt. reports that she has an appointment with the surgeon. Pt. reports having had the 2nd dose of the vaccine this afternoon. Pt. reported feeling flushed with her face feeling hot. Pt. appeared to have some flushing in her face, and neck. Pt. was using an ice pack secondary to being hot. Pt. tolerated the session well, and presented with active left elbow extension, wrist extension, thumb radial abduction, thumb IP flexion, and forearm supination. Pt. continues to work on facilitating active motion in her left forearm wrist, and digits.    Occupational performance deficits (Please refer to evaluation for details):  ADL's;IADL's    Rehab Potential  Good    Clinical Decision Making  Several treatment options, min-mod task modification necessary    Comorbidities Affecting Occupational Performance:  May have comorbidities impacting occupational performance    Modification or Assistance to Complete Evaluation   Min-Moderate modification of tasks or assist with assess necessary to complete eval    OT Frequency  2x / week    OT Duration  12 weeks    OT Treatment/Interventions  Self-care/ADL training;Moist Heat;DME and/or AE instruction;Neuromuscular education;Therapeutic exercise;Therapeutic activities;Energy  conservation;Patient/family education    Consulted and Agree with Plan of Care  Patient       Patient will benefit from skilled therapeutic intervention in order to improve the following deficits and impairments:           Visit Diagnosis: Muscle weakness (generalized)    Problem List Patient Active Problem List   Diagnosis Date Noted  . E. coli UTI   . Diabetes mellitus type 2 in obese ([place])   . Chronic pain of right knee   . 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 Hemiparesis affecting left side as late effect  of stroke ([place])   . Acute ischemic right middle cerebral artery (MCA) stroke ([place]) 07/22/2019  . Carotid artery dissection  (HCC) s/p stent placement 07/21/2019  . Diabetes mellitus type II, uncontrolled ([place]) 07/21/2019  . Acute blood loss anemia 07/21/2019  . Leukocytosis 07/21/2019  . Stroke (cerebrum) ([place]) - R MCA infarct s/p tenecteplase and mechanical thrombectomy w/ d/t ICA dissection  07/15/2019  . Middle cerebral artery embolism, right 07/15/2019  . Controlled type 2 diabetes mellitus without complication, without long-term current use of insulin ([place]) 05/28/2017  . Attention deficit hyperactivity disorder (ADHD), combined type 04/18/2017  . Morbid obesity ([place]) 01/25/2016  . Umbilical hernia [id number]  . Hiatal hernia 12/08/2015  . History of Clostridium difficile colitis 06/19/2014  . HTN (hypertension) 12/23/2013  . PCOS (polycystic ovarian syndrome) 12/23/2013  . Bariatric surgery status 01/28/2013    Harrel Carina, MS, OTR/L 09/30/2019, 6:01 PM  [place] [facility] SERVICES [address] [place], [place], [address] Phone: [phone]   Fax:  [phone]  Name: Kathryn Coffey MRN: [id number] Date of Birth: Feb 22, 1971

## 2019-10-01 ENCOUNTER — Ambulatory Visit
Admission: RE | Admit: 2019-10-01 | Discharge: 2019-10-01 | Disposition: A | Discharge status: Home / Self Care | Payer: No Typology Code available for payment source | Source: Ambulatory Visit | Attending: Physical Medicine and Rehabilitation | Admitting: Physical Medicine and Rehabilitation

## 2019-10-01 ENCOUNTER — Other Ambulatory Visit: Payer: Self-pay

## 2019-10-01 DIAGNOSIS — S46212A Strain of muscle, fascia and tendon of other parts of biceps, left arm, initial encounter: Secondary | ICD-10-CM | POA: Diagnosis not present

## 2019-10-02 ENCOUNTER — Encounter: Payer: No Typology Code available for payment source | Admitting: Occupational Therapy

## 2019-10-02 ENCOUNTER — Ambulatory Visit: Payer: No Typology Code available for payment source

## 2019-10-03 ENCOUNTER — Ambulatory Visit: Payer: Self-pay

## 2019-10-03 ENCOUNTER — Other Ambulatory Visit: Payer: Self-pay

## 2019-10-03 ENCOUNTER — Ambulatory Visit (INDEPENDENT_AMBULATORY_CARE_PROVIDER_SITE_OTHER): Payer: No Typology Code available for payment source | Admitting: Orthopedic Surgery

## 2019-10-03 DIAGNOSIS — M25512 Pain in left shoulder: Secondary | ICD-10-CM

## 2019-10-03 NOTE — Progress Notes (Signed)
Subjective: Patient is here for ultrasound-guided intra-articular left glenohumeral injection.  He is status post stroke with left arm weakness.  She has diabetes as well.  She developed adhesive capsulitis in the left shoulder and is here for a glenohumeral injection.  Objective: Very limited passive range of motion.  Procedure: Ultrasound-guided left glenohumeral injection: After sterile prep with Betadine, injected 8 cc 1% lidocaine without epinephrine and 40 mg methylprednisolone using a 22-gauge spinal needle, passing the needle through approach into the glenohumeral joint.  Injectate was seen filling the joint capsule.  Follow-up as directed.

## 2019-10-04 ENCOUNTER — Encounter: Payer: Self-pay | Admitting: Orthopedic Surgery

## 2019-10-04 NOTE — Progress Notes (Signed)
Office Visit Note   Patient: Kathryn Coffey           Date of Birth: 1971-08-04           MRN: [id number] Visit Date: 10/03/2019 Requested by: Izora Ribas, MD [phone],  [place] [address] PCP: Rusty Aus, MD  Subjective: Chief Complaint  Patient presents with  . Left Shoulder - Pain    HPI: Kathryn Coffey is a patient with left shoulder pain.  Had acute onset of pain about 2 to 3 weeks ago.  Denies any history of injury.  Notably the patient did have a stroke affecting the left side of her body back in November.  She has been doing rehab 2 times a week due to the stroke.  She takes Zanaflex at night.  She has had an MRI scan which is reviewed.  Did show some biceps tendinosis but rotator cuff was intact except for mild strain of the infraspinatus muscle tendon junction.  No significant arthritis.              ROS: All systems reviewed are negative as they relate to the chief complaint within the history of present illness.  Patient denies  fevers or chills.   Assessment & Plan: Visit Diagnoses:  1. Left shoulder pain, unspecified chronicity     Plan: Impression is left sided stroke affecting predominantly left upper extremity as opposed to the lower extremity.  The left arm is fairly flaccid at this time and the diminished range of motion in that shoulder is likely due to early frozen shoulder as opposed to any operatively treatable pathology.  MRI scan confirms this.  Patient has pretty limited deltoid strength.  At this time I think she has an early frozen shoulder based on limitation of passive range of motion.  I did show her husband how to do some stretching exercises of forward flexion external rotation and abduction.  Also asked Dr. Junius Roads to do left shoulder intra-articular ultrasound-guided injection into the shoulder joint today to really help with those stretching exercises in terms of pain relief.  We will check her back in about 8 weeks for clinical  recheck.  This will be a difficult problem for the shoulder if the motor function of the muscles is not improved by then.  Follow-Up Instructions: Return in about 8 weeks (around 11/28/2019).   Orders:  Orders Placed This Encounter  Procedures  . US Guided Needle Placement - No Linked Charges   No orders of the defined types were placed in this encounter.     Procedures: No procedures performed   Clinical Data: No additional findings.  Objective: Vital Signs: There were no vitals taken for this visit.  Physical Exam:   Constitutional: Patient appears well-developed HEENT:  Head: Normocephalic Eyes:EOM are normal Neck: Normal range of motion Cardiovascular: Normal rate Pulmonary/chest: Effort normal Neurologic: Patient is alert Skin: Skin is warm Psychiatric: Patient has normal mood and affect    Ortho Exam: Ortho exam demonstrates fairly flaccid left arm.  Patient has about 5 out of 5 ankle dorsiflexion plantarflexion quad hamstring strength in the legs bilaterally.  Left arm does not really have much in terms of voluntary motion of the wrist elbow or shoulder.  No Popeye deformity in the arm.  Biceps and triceps also very weak.  Patient does have restricted passive range of motion of that left shoulder to about 30 degrees of external rotation compared to 60 on the  right.  Isolated glenohumeral abduction is 100 on the right compared to 75 on the left.  Forward flexion 180 on the right compared to about 95 on the left.  Deltoid does not fire.  Shoulder is located.  Specialty Comments:  No specialty comments available.  Imaging: US Guided Needle Placement - No Linked Charges  Result Date: 10/03/2019 Please see Notes tab for imaging impression.    PMFS History: Patient Active Problem List   Diagnosis Date Noted  . E. coli UTI   . Diabetes mellitus type 2 in obese ([place])   . Chronic pain of right knee   . 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 Hemiparesis  affecting left side as late effect of stroke ([place])   . Acute ischemic right middle cerebral artery (MCA) stroke ([place]) 07/22/2019  . Carotid artery dissection  (HCC) s/p stent placement 07/21/2019  . Diabetes mellitus type II, uncontrolled ([place]) 07/21/2019  . Acute blood loss anemia 07/21/2019  . Leukocytosis 07/21/2019  . Stroke (cerebrum) ([place]) - R MCA infarct s/p tenecteplase and mechanical thrombectomy w/ d/t ICA dissection  07/15/2019  . Middle cerebral artery embolism, right 07/15/2019  . Controlled type 2 diabetes mellitus without complication, without long-term current use of insulin ([place]) 05/28/2017  . Attention deficit hyperactivity disorder (ADHD), combined type 04/18/2017  . Morbid obesity ([place]) 01/25/2016  . Umbilical hernia [id number]  . Hiatal hernia 12/08/2015  . History of Clostridium difficile colitis 06/19/2014  . HTN (hypertension) 12/23/2013  . PCOS (polycystic ovarian syndrome) 12/23/2013  . Bariatric surgery status 01/28/2013   Past Medical History:  Diagnosis Date  . Abdominal wall cellulitis 2013 and 2014  . Asthma    allergic to grasses  . B12 deficiency   . Complication of anesthesia   . Costochondritis   . Diabetes mellitus without complication ([place])   . Gastric anomaly    multiple small ulcers  . GERD (gastroesophageal reflux disease)   . Herpes 06/2018   POSSIBLY IN EYE- PT TAKING VALTREX AND WILL SEE OPTHAMOLOGIST TO SEE IF VALTREX IS WORKING  . History of abnormal cervical Pap smear   . History of Clostridium difficile colitis   . History of hiatal hernia   . History of kidney stones   . Hypertension   . IBS (irritable bowel syndrome)   . Migraine   . Morbid obesity ([place])    s/p attempted gastric banding now decompressed  . PCOS (polycystic ovarian syndrome)   . PONV (postoperative nausea and vomiting)    WITH SPINAL ONLY  . Stroke [facility])     Family History  Problem Relation Age of Onset  . Breast cancer Maternal Grandmother 53  .  Diabetes Mother   . Diabetes Father   . Heart attack Father     Past Surgical History:  Procedure Laterality Date  . BREAST EXCISIONAL BIOPSY Left    cyst excision - Dr. Patty Sermons  . CESAREAN SECTION    . CHOLECYSTECTOMY    . COLONOSCOPY    . EAR CYST EXCISION Right 07/04/2018   Procedure: EXCISION GLOMUS TUMOR THUMBNAIL;  Surgeon: Corky Mull, MD;  Location: [facility] ORS;  Service: Orthopedics;  Laterality: Right;  . ESOPHAGOGASTRODUODENOSCOPY    . ESOPHAGOGASTRODUODENOSCOPY (EGD) WITH PROPOFOL N/A 12/06/2015   Procedure: ESOPHAGOGASTRODUODENOSCOPY (EGD) WITH PROPOFOL;  Surgeon: Manya Silvas, MD;  Location: [facility] ENDOSCOPY;  Service: Endoscopy;  Laterality: N/A;  . IR ANGIO INTRA EXTRACRAN SEL INTERNAL CAROTID UNI L MOD SED  07/15/2019  .  IR ANGIO VERTEBRAL SEL SUBCLAVIAN INNOMINATE UNI R MOD SED  07/15/2019  . IR CT HEAD LTD  07/15/2019  . IR INTRAVSC STENT CERV CAROTID W/O EMB-PROT MOD SED INC ANGIO  07/15/2019  . IR PERCUTANEOUS ART THROMBECTOMY/INFUSION INTRACRANIAL INC DIAG ANGIO  07/15/2019  . LAPAROSCOPIC GASTRIC BANDING    . LAPAROSCOPIC GASTRIC RESTRICTIVE DUODENAL PROCEDURE (DUODENAL SWITCH) N/A 01/25/2016   Procedure: LAPAROSCOPIC GASTRIC RESTRICTIVE DUODENAL PROCEDURE (DUODENAL SWITCH);  Surgeon: Ladora Daniel, MD;  Location: [facility] ORS;  Service: General;  Laterality: N/A;  . OVARY SURGERY     x2  . RADIOLOGY WITH ANESTHESIA N/A 07/15/2019   Procedure: IR WITH ANESTHESIA;  Surgeon: Luanne Bras, MD;  Location: [place];  Service: Radiology;  Laterality: N/A;  . TONSILLECTOMY    . TUBAL LIGATION    . UMBILICAL HERNIA REPAIR N/A 01/25/2016   Procedure: LAPAROSCOPIC UMBILICAL HERNIA;  Surgeon: Ladora Daniel, MD;  Location: [facility] ORS;  Service: General;  Laterality: N/A;  . UMBILICAL HERNIA REPAIR N/A 10/20/2016   Procedure: HERNIA REPAIR UMBILICAL ADULT;  Surgeon: Leonie Green, MD;  Location: [facility] ORS;  Service: General;  Laterality: N/A;   Social History    Occupational History  . Not on file  Tobacco Use  . Smoking status: Never Smoker  . Smokeless tobacco: Never Used  Substance and Sexual Activity  . Alcohol use: Yes    Alcohol/week: 0.0 standard drinks    Comment: SOCIAL  . Drug use: No  . Sexual activity: Yes    Birth control/protection: I.U.D., Other-see comments    Comment: BTL

## 2019-10-06 ENCOUNTER — Other Ambulatory Visit: Payer: Self-pay

## 2019-10-07 ENCOUNTER — Other Ambulatory Visit: Payer: Self-pay | Admitting: Physical Medicine and Rehabilitation

## 2019-10-07 ENCOUNTER — Ambulatory Visit: Payer: No Typology Code available for payment source | Admitting: Occupational Therapy

## 2019-10-07 ENCOUNTER — Other Ambulatory Visit: Payer: Self-pay

## 2019-10-07 ENCOUNTER — Ambulatory Visit: Payer: No Typology Code available for payment source

## 2019-10-07 DIAGNOSIS — M6281 Muscle weakness (generalized): Secondary | ICD-10-CM | POA: Diagnosis not present

## 2019-10-07 MED ORDER — TOPIRAMATE 25 MG PO TABS: 25.0000 mg | ORAL_TABLET | Freq: Every day | ORAL | 0 refills | Status: DC

## 2019-10-07 MED ORDER — MIRABEGRON ER 25 MG PO TB24: 25.0000 mg | ORAL_TABLET | Freq: Every day | ORAL | 0 refills | Status: DC

## 2019-10-08 ENCOUNTER — Encounter: Payer: Self-pay | Admitting: Occupational Therapy

## 2019-10-08 NOTE — Therapy (Addendum)
[place] MAIN [facility] SERVICES [address] [place], [place], [address] Phone: [phone]   Fax:  [phone]  Occupational Therapy Treatment  Patient Details  Name: Kathryn Coffey MRN: [id number] Date of Birth: 1971/08/15 Referring Provider (OT): Raulkar   Encounter Date: 10/07/2019  OT End of Session - 10/08/19 1016    Visit Number  5    Number of Visits  24    Date for OT Re-Evaluation  12/01/19    Authorization Type  Progress report period starting  on 09/08/2019    OT Start Time  1430    OT Stop Time  1515    OT Time Calculation (min)  45 min    Activity Tolerance  Patient tolerated treatment well    Behavior During Therapy  [facility] for tasks assessed/performed       Past Medical History:  Diagnosis Date  . Abdominal wall cellulitis 2013 and 2014  . Asthma    allergic to grasses  . B12 deficiency   . Complication of anesthesia   . Costochondritis   . Diabetes mellitus without complication ([place])   . Gastric anomaly    multiple small ulcers  . GERD (gastroesophageal reflux disease)   . Herpes 06/2018   POSSIBLY IN EYE- PT TAKING VALTREX AND WILL SEE OPTHAMOLOGIST TO SEE IF VALTREX IS WORKING  . History of abnormal cervical Pap smear   . History of Clostridium difficile colitis   . History of hiatal hernia   . History of kidney stones   . Hypertension   . IBS (irritable bowel syndrome)   . Migraine   . Morbid obesity ([place])    s/p attempted gastric banding now decompressed  . PCOS (polycystic ovarian syndrome)   . PONV (postoperative nausea and vomiting)    WITH SPINAL ONLY  . Stroke [facility])     Past Surgical History:  Procedure Laterality Date  . BREAST EXCISIONAL BIOPSY Left    cyst excision - Dr. Patty Sermons  . CESAREAN SECTION    . CHOLECYSTECTOMY    . COLONOSCOPY    . EAR CYST EXCISION Right 07/04/2018   Procedure: EXCISION GLOMUS TUMOR THUMBNAIL;  Surgeon: Corky Mull, MD;  Location: [facility] ORS;  Service:  Orthopedics;  Laterality: Right;  . ESOPHAGOGASTRODUODENOSCOPY    . ESOPHAGOGASTRODUODENOSCOPY (EGD) WITH PROPOFOL N/A 12/06/2015   Procedure: ESOPHAGOGASTRODUODENOSCOPY (EGD) WITH PROPOFOL;  Surgeon: Manya Silvas, MD;  Location: [facility] ENDOSCOPY;  Service: Endoscopy;  Laterality: N/A;  . IR ANGIO INTRA EXTRACRAN SEL INTERNAL CAROTID UNI L MOD SED  07/15/2019  . IR ANGIO VERTEBRAL SEL SUBCLAVIAN INNOMINATE UNI R MOD SED  07/15/2019  . IR CT HEAD LTD  07/15/2019  . IR INTRAVSC STENT CERV CAROTID W/O EMB-PROT MOD SED INC ANGIO  07/15/2019  . IR PERCUTANEOUS ART THROMBECTOMY/INFUSION INTRACRANIAL INC DIAG ANGIO  07/15/2019  . LAPAROSCOPIC GASTRIC BANDING    . LAPAROSCOPIC GASTRIC RESTRICTIVE DUODENAL PROCEDURE (DUODENAL SWITCH) N/A 01/25/2016   Procedure: LAPAROSCOPIC GASTRIC RESTRICTIVE DUODENAL PROCEDURE (DUODENAL SWITCH);  Surgeon: Ladora Daniel, MD;  Location: [facility] ORS;  Service: General;  Laterality: N/A;  . OVARY SURGERY     x2  . RADIOLOGY WITH ANESTHESIA N/A 07/15/2019   Procedure: IR WITH ANESTHESIA;  Surgeon: Luanne Bras, MD;  Location: [place];  Service: Radiology;  Laterality: N/A;  . TONSILLECTOMY    . TUBAL LIGATION    . UMBILICAL HERNIA REPAIR N/A 01/25/2016   Procedure: LAPAROSCOPIC UMBILICAL HERNIA;  Surgeon: Ladora Daniel,  MD;  Location: [facility] ORS;  Service: General;  Laterality: N/A;  . UMBILICAL HERNIA REPAIR N/A 10/20/2016   Procedure: HERNIA REPAIR UMBILICAL ADULT;  Surgeon: Leonie Green, MD;  Location: [facility] ORS;  Service: General;  Laterality: N/A;    There were no vitals filed for this visit.  Subjective Assessment - 10/08/19 1019    Subjective   Pt.  reports that she was sick following her second dose of the vaccine last week.    Patient is accompanied by:  Family member    Pertinent History  Per pt. chart. Pt. is a 49 y.o. female with history of HTN, T2DM, obesity who was admitted on 07/15/19 with onset of HA progressing to flaccid left hemiparesis a few  hours later.  CT head showed acute nonhemorrhagic infarct in right basal ganglia with hyperdense right MCA sign.  CT A/P head neck showed perfusion deficit with occluded right ICA and slow flow in M1.  She underwent cerebral angiogram with revascularization of right MCA with T1C1 revascularization and repair of right ICA with flow diverter device. Stroke felt to be embolic likely due to ICA dissection. Postprocedure head CT was negative for bleed repeat CTA head neck showed reocclusion of right ICA origin and occlusion of right ICA stent but now with patent right MCA. Pt. received inpatient rehab services for 3 weeks, and home health services.    Limitations  LUE AROM    Currently in Pain?  Yes    Pain Score  2     Pain Location  Shoulder    Pain Orientation  Left         Therapeutic Exercise:  Pt. tolerated left shoulder flexion, abduction, horizontal abduction, and adduction following moist heat modality. Pt. worked on facilitation of activeforearm supination,wrist, and digit extension, thumb palmar abduction, and adduction. Pt. was able to achieveconsistenttriceps motion, active forearm supination with her forearm  supported flat on a tabletop surface, and thumb radial abduction.Pt. worked while seated with the w/c support tray under her left UE.  Response to Treatment:   Pt. had an orthopedic appointment for her left shoulder following an MRI. Per pt., and chart pt. has a frozen shoulder, and received an injection in her left shoulder. Pt. has been doing left shoulder stretches with her husband at home. Pt. reports decreased pain, is improving with PROM in the shoulder. Pt. presents with improving with active supination starting with her forearm flat on a tabletop surface. Pt. presents with active elbow flexion, and extension, and worked on performing hand to face patterns with support at her wrist. Pt. is initiating  active responses for thumb radial deviation. Pt. conitnues to work on  facilitaing active motion in her left UE, and hand.                         OT Education - 10/08/19 1020    Education Details  Left UE functioning    Person(s) Educated  Patient;Spouse    Methods  Demonstration    Comprehension  Returned demonstration;Verbalized understanding          OT Long Term Goals - 09/08/19 1402      OT LONG TERM GOAL #1   Title  Pt. will increase left shoulder flexion PROM by 20 degrees to assist with UE dressing    Baseline  Eval: Shoulder flexion PROM 80 degrees    Time  12    Period  Weeks    Status  New    Target Date  12/01/19      OT LONG TERM GOAL #2   Title  Pt. will be able to actively stabilize left shoulder for 10 seconds in supine with shoulde flexed to 90 degrees, and elbow in extension in preparation for functional reaching.    Baseline  Eval: Pt.is unable to stabilize left shoulder    Time  12    Period  Weeks    Status  New    Target Date  12/01/19      OT LONG TERM GOAL #3   Title  Pt. will independently perform hand to face patterns with the LUE in preparation for light self grooming tasks.    Baseline  Eval: Pt. is unable to perform hand to face patterns with the LUE.    Time  12    Period  Weeks    Status  New    Target Date  12/01/19      OT LONG TERM GOAL #4   Title  Pt. will initiate 10 degrees of active left wrist extension  in preparation for actively lifting her hand off of a surface.    Baseline  Eval: Pt. is unable    Time  12    Period  Weeks    Status  New    Target Date  12/01/19      OT LONG TERM GOAL #5   Title  Pt. will initiate left hand digit extension in order to be able to actively release an objects during ADL tasks.    Baseline  Eval: Pt. is unable    Time  12    Period  Weeks    Status  New    Target Date  12/01/19      Long Term Additional Goals   Additional Long Term Goals  Yes      OT LONG TERM GOAL #6   Title  Pt. increase left hand digit flexion in order to be able to  hold her toothbrush while applying toothpaste with her left right hand.    Baseline  Eval: Pt. is unable    Time  12    Period  Weeks    Status  New    Target Date  12/01/19      OT LONG TERM GOAL #7   Title  --            Plan - 10/08/19 1017    Clinical Impression Statement  Pt. had an orthopedic appointment for her left shoulder following an MRI. Per pt., and chart pt. has a frozen shoulder, and received an injection in her left shoulder. Pt. has been doing left shoulder stretches with her husband at home. Pt. reports decreased pain, is improving with PROM in the shoulder. Pt. presents with improving with active supination starting with her forearm flat on a tabletop surface. Pt. presents with active elbow flexion, and extension, and worked on performing hand to face patterns with support at her wrist. Pt. is initiating  active responses for thumb radial deviation. Pt. conitnues to work on facilitaing active motion in her left UE, and hand.    Occupational performance deficits (Please refer to evaluation for details):  ADL's;IADL's    Rehab Potential  Good    Clinical Decision Making  Several treatment options, min-mod task modification necessary    Comorbidities Affecting Occupational Performance:  May have comorbidities impacting occupational performance    Modification or Assistance to Complete Evaluation  Min-Moderate modification of tasks or assist with assess necessary to complete eval    OT Duration  12 weeks    OT Treatment/Interventions  Self-care/ADL training;Moist Heat;DME and/or AE instruction;Neuromuscular education;Therapeutic exercise;Therapeutic activities;Energy conservation;Patient/family education    Consulted and Agree with Plan of Care  Patient       Patient will benefit from skilled therapeutic intervention in order to improve the following deficits and impairments:           Visit Diagnosis: Muscle weakness (generalized)    Problem List Patient  Active Problem List   Diagnosis Date Noted  . E. coli UTI   . Diabetes mellitus type 2 in obese ([place])   . Chronic pain of right knee   . 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 Hemiparesis affecting left side as late effect of stroke ([place])   . Acute ischemic right middle cerebral artery (MCA) stroke ([place]) 07/22/2019  . Carotid artery dissection  (HCC) s/p stent placement 07/21/2019  . Diabetes mellitus type II, uncontrolled ([place]) 07/21/2019  . Acute blood loss anemia 07/21/2019  . Leukocytosis 07/21/2019  . Stroke (cerebrum) ([place]) - R MCA infarct s/p tenecteplase and mechanical thrombectomy w/ d/t ICA dissection  07/15/2019  . Middle cerebral artery embolism, right 07/15/2019  . Controlled type 2 diabetes mellitus without complication, without long-term current use of insulin ([place]) 05/28/2017  . Attention deficit hyperactivity disorder (ADHD), combined type 04/18/2017  . Morbid obesity ([place]) 01/25/2016  . Umbilical hernia [id number]  . Hiatal hernia 12/08/2015  . History of Clostridium difficile colitis 06/19/2014  . HTN (hypertension) 12/23/2013  . PCOS (polycystic ovarian syndrome) 12/23/2013  . Bariatric surgery status 01/28/2013    Harrel Carina, MS, OTR/L 10/08/2019, 10:30 AM  [place] MAIN [facility] SERVICES [address] [place], [place], [address] Phone: [phone]   Fax:  [phone]  Name: Kathryn Coffey MRN: [id number] Date of Birth: 04-Jan-1971

## 2019-10-09 ENCOUNTER — Ambulatory Visit: Payer: No Typology Code available for payment source | Admitting: Physical Therapy

## 2019-10-10 NOTE — Telephone Encounter (Signed)
Message from patient

## 2019-10-14 ENCOUNTER — Encounter: Payer: Self-pay | Admitting: Occupational Therapy

## 2019-10-14 ENCOUNTER — Encounter: Payer: Self-pay | Admitting: Physical Therapy

## 2019-10-14 ENCOUNTER — Ambulatory Visit: Payer: No Typology Code available for payment source | Admitting: Physical Therapy

## 2019-10-14 ENCOUNTER — Other Ambulatory Visit: Payer: Self-pay

## 2019-10-14 ENCOUNTER — Ambulatory Visit: Payer: No Typology Code available for payment source | Admitting: Occupational Therapy

## 2019-10-14 DIAGNOSIS — R262 Difficulty in walking, not elsewhere classified: Secondary | ICD-10-CM

## 2019-10-14 DIAGNOSIS — M6281 Muscle weakness (generalized): Secondary | ICD-10-CM

## 2019-10-14 DIAGNOSIS — R278 Other lack of coordination: Secondary | ICD-10-CM

## 2019-10-14 NOTE — Therapy (Signed)
[place] MAIN [facility] SERVICES [address] [place], [place], [address] Phone: [phone]   Fax:  [phone]  Physical Therapy Treatment  Patient Details  Name: Kathryn Coffey MRN: [id number] Date of Birth: 05/04/1971 No data recorded  Encounter Date: 10/14/2019  PT End of Session - 10/14/19 1709    Visit Number  6    Number of Visits  36    Date for PT Re-Evaluation  11/03/19    PT Start Time  0145    PT Stop Time  0230    PT Time Calculation (min)  45 min    Equipment Utilized During Treatment  Gait belt    Activity Tolerance  Patient tolerated treatment well    Behavior During Therapy  WFL for tasks assessed/performed       Past Medical History:  Diagnosis Date  . Abdominal wall cellulitis 2013 and 2014  . Asthma    allergic to grasses  . B12 deficiency   . Complication of anesthesia   . Costochondritis   . Diabetes mellitus without complication ([place])   . Gastric anomaly    multiple small ulcers  . GERD (gastroesophageal reflux disease)   . Herpes 06/2018   POSSIBLY IN EYE- PT TAKING VALTREX AND WILL SEE OPTHAMOLOGIST TO SEE IF VALTREX IS WORKING  . History of abnormal cervical Pap smear   . History of Clostridium difficile colitis   . History of hiatal hernia   . History of kidney stones   . Hypertension   . IBS (irritable bowel syndrome)   . Migraine   . Morbid obesity ([place])    s/p attempted gastric banding now decompressed  . PCOS (polycystic ovarian syndrome)   . PONV (postoperative nausea and vomiting)    WITH SPINAL ONLY  . Stroke [facility])     Past Surgical History:  Procedure Laterality Date  . BREAST EXCISIONAL BIOPSY Left    cyst excision - Dr. Patty Sermons  . CESAREAN SECTION    . CHOLECYSTECTOMY    . COLONOSCOPY    . EAR CYST EXCISION Right 07/04/2018   Procedure: EXCISION GLOMUS TUMOR THUMBNAIL;  Surgeon: Corky Mull, MD;  Location: [facility] ORS;  Service: Orthopedics;  Laterality: Right;  .  ESOPHAGOGASTRODUODENOSCOPY    . ESOPHAGOGASTRODUODENOSCOPY (EGD) WITH PROPOFOL N/A 12/06/2015   Procedure: ESOPHAGOGASTRODUODENOSCOPY (EGD) WITH PROPOFOL;  Surgeon: Manya Silvas, MD;  [facility];  Service: Endoscopy;  Laterality: N/A;  . IR ANGIO INTRA EXTRACRAN SEL INTERNAL CAROTID UNI L MOD SED  07/15/2019  . IR ANGIO VERTEBRAL SEL SUBCLAVIAN INNOMINATE UNI R MOD SED  07/15/2019  . IR CT HEAD LTD  07/15/2019  . IR INTRAVSC STENT CERV CAROTID W/O EMB-PROT MOD SED INC ANGIO  07/15/2019  . IR PERCUTANEOUS ART THROMBECTOMY/INFUSION INTRACRANIAL INC DIAG ANGIO  07/15/2019  . LAPAROSCOPIC GASTRIC BANDING    . LAPAROSCOPIC GASTRIC RESTRICTIVE DUODENAL PROCEDURE (DUODENAL SWITCH) N/A 01/25/2016   Procedure: LAPAROSCOPIC GASTRIC RESTRICTIVE DUODENAL PROCEDURE (DUODENAL SWITCH);  Surgeon: Ladora Daniel, MD;  Location: [facility] ORS;  Service: General;  Laterality: N/A;  . OVARY SURGERY     x2  . RADIOLOGY WITH ANESTHESIA N/A 07/15/2019   Procedure: IR WITH ANESTHESIA;  Surgeon: Luanne Bras, MD;  Location: [place];  Service: Radiology;  Laterality: N/A;  . TONSILLECTOMY    . TUBAL LIGATION    . UMBILICAL HERNIA REPAIR N/A 01/25/2016   Procedure: LAPAROSCOPIC UMBILICAL HERNIA;  Surgeon: Ladora Daniel, MD;  Location: [facility] ORS;  Service: General;  Laterality: N/A;  . UMBILICAL HERNIA REPAIR N/A 10/20/2016   Procedure: HERNIA REPAIR UMBILICAL ADULT;  Surgeon: Leonie Green, MD;  Location: [facility] ORS;  Service: General;  Laterality: N/A;    There were no vitals filed for this visit.  Subjective Assessment - 10/14/19 1707    Subjective  Pt reports compliance with her HEP and reports no falls or problems since her last visit.    Pertinent History  Patient was in the hospital [place] and then in patient rehab 3 weeks. She was dicharged 08/13/19 and then HHPT and was discharged 09/03/19. She has not had any falls.    Currently in Pain?  No/denies         Treatment:  Therapeutic  Exercises:       Seated: cues for body mechanics and neutral alignment   RTB resisted marches LLE 15x  RTB hamstring curl 15x LLE  RTB adduction against PT resistance 15x LLE RTB abduction against PT resistance 15x LLE RTB df 15x LLE RTB Inv 15x LLE RTB EV 15x LLE   Supine on Plinth:    SLR 2x10 B Bridging with ball squeeze 2x10 hooklying abd/add BTB 20x Heel slides 2x10 B   Standing: Weightshifting in // bars on airex pad; mini squats in // bars with cueing to avoid L knee hyperextension 2x10                  PT Education - 10/14/19 1708    Education Details  exercise technique, body mechanics, weight shift    Person(s) Educated  Patient    Methods  Explanation;Demonstration;Tactile cues;Verbal cues    Comprehension  Verbalized understanding;Returned demonstration;Verbal cues required;Tactile cues required       PT Short Term Goals - 09/08/19 1218      PT SHORT TERM GOAL #1   Title  Patient will be independent in home exercise program to improve strength/mobility for better functional independence with ADLs.    Time  6    Period  Weeks    Status  New    Target Date  10/20/19      PT SHORT TERM GOAL #2   Title  Patient (> 70 years old) will complete five times sit to stand test in < 15 seconds indicating an increased LE strength and improved balance.    Time  6    Period  Weeks    Status  New    Target Date  10/20/19        PT Long Term Goals - 09/08/19 1222      PT LONG TERM GOAL #1   Title  Patient will increase Berg Balance score by > 6 points to demonstrate decreased fall risk during functional activities.    Time  12    Period  Weeks    Status  New    Target Date  12/01/19      PT LONG TERM GOAL #2   Title  Patient will increase six minute walk test distance to >1200 for progression to community ambulator and improve gait ability    Time  12    Period  Weeks    Status  New    Target Date  12/01/19      PT LONG TERM GOAL #3    Title  Patient will increase 10 meter walk test to >1.33m/s as to improve gait speed for better community ambulation and to reduce fall risk    Time  12  Period  Weeks    Status  New    Target Date  12/01/19            Plan - 10/14/19 1710    Clinical Impression Statement  Pt needed cueing for more slow and controlled motion with LE ther ex's during session today.  Was able to improve technique with verbal and manual cueing.  Worked on mini squats and L knee stability with flexion/extension and avoiding hyperextension in // bars today.    Personal Factors and Comorbidities  Comorbidity 1    Comorbidities  diabetes, high blood pressure, reflux,    Examination-Activity Limitations  Carry;Caring for Others;Bend;Locomotion Level;Self Feeding;Sleep;Toileting    Examination-Participation Restrictions  Cleaning;Community Activity;Driving;Laundry;Shop    Stability/Clinical Decision Making  Stable/Uncomplicated    Clinical Decision Making  Low    Rehab Potential  Good    PT Frequency  2x / week    PT Duration  12 weeks    PT Treatment/Interventions  Manual techniques;Functional mobility training;Stair training;Gait training;Electrical Stimulation;Moist Heat;Ultrasound;Therapeutic exercise;Balance training;Neuromuscular re-education    PT Next Visit Plan  strengthening and balance    PT Home Exercise Plan  standing hip abd,    Consulted and Agree with Plan of Care  Family member/caregiver;Patient       Patient will benefit from skilled therapeutic intervention in order to improve the following deficits and impairments:  Abnormal gait, Decreased knowledge of use of DME, Dizziness, Pain, Decreased coordination, Impaired UE functional use, Decreased strength, Decreased endurance, Decreased activity tolerance, Decreased balance, Difficulty walking, Obesity  Visit Diagnosis: Muscle weakness (generalized)  Other lack of coordination  Difficulty in walking, not elsewhere  classified     Problem List Patient Active Problem List   Diagnosis Date Noted  . E. coli UTI   . Diabetes mellitus type 2 in obese ([place])   . Chronic pain of right knee   . 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 Hemiparesis affecting left side as late effect of stroke ([place])   . Acute ischemic right middle cerebral artery (MCA) stroke ([place]) 07/22/2019  . Carotid artery dissection  (HCC) s/p stent placement 07/21/2019  . Diabetes mellitus type II, uncontrolled ([place]) 07/21/2019  . Acute blood loss anemia 07/21/2019  . Leukocytosis 07/21/2019  . Stroke (cerebrum) ([place]) - R MCA infarct s/p tenecteplase and mechanical thrombectomy w/ d/t ICA dissection  07/15/2019  . Middle cerebral artery embolism, right 07/15/2019  . Controlled type 2 diabetes mellitus without complication, without long-term current use of insulin ([place]) 05/28/2017  . Attention deficit hyperactivity disorder (ADHD), combined type 04/18/2017  . Morbid obesity ([place]) 01/25/2016  . Umbilical hernia [id number]  . Hiatal hernia 12/08/2015  . History of Clostridium difficile colitis 06/19/2014  . HTN (hypertension) 12/23/2013  . PCOS (polycystic ovarian syndrome) 12/23/2013  . Bariatric surgery status 01/28/2013    Theon Sobotka, MPT 10/14/2019, 5:15 PM  [place] MAIN [facility] SERVICES [address] [place], [place], [address] Phone: [phone]   Fax:  [phone]  Name: Kathryn Coffey MRN: [id number] Date of Birth: [birth year]

## 2019-10-14 NOTE — Therapy (Addendum)
[place] MAIN [facility] SERVICES [address] [place], [place], [address] Phone: [phone]   Fax:  [phone]  Occupational Therapy Treatment  Patient Details  Name: Kathryn Coffey MRN: [id number] Date of Birth: [birth year] Referring Provider (OT): Raulkar   Encounter Date: 10/14/2019  OT End of Session - 10/14/19 1440    Visit Number  6    Number of Visits  24    Date for OT Re-Evaluation  12/01/19    Authorization Type  Progress report period starting  on 09/08/2019    Authorization Time Period  FOTO Every10th visit    OT Start Time  1300    OT Stop Time  1345    OT Time Calculation (min)  45 min    Activity Tolerance  Patient tolerated treatment well    Behavior During Therapy  [facility] for tasks assessed/performed       Past Medical History:  Diagnosis Date  . Abdominal wall cellulitis 2013 and 2014  . Asthma    allergic to grasses  . B12 deficiency   . Complication of anesthesia   . Costochondritis   . Diabetes mellitus without complication ([place])   . Gastric anomaly    multiple small ulcers  . GERD (gastroesophageal reflux disease)   . Herpes 06/2018   POSSIBLY IN EYE- PT TAKING VALTREX AND WILL SEE OPTHAMOLOGIST TO SEE IF VALTREX IS WORKING  . History of abnormal cervical Pap smear   . History of Clostridium difficile colitis   . History of hiatal hernia   . History of kidney stones   . Hypertension   . IBS (irritable bowel syndrome)   . Migraine   . Morbid obesity ([place])    s/p attempted gastric banding now decompressed  . PCOS (polycystic ovarian syndrome)   . PONV (postoperative nausea and vomiting)    WITH SPINAL ONLY  . Stroke [facility])     Past Surgical History:  Procedure Laterality Date  . BREAST EXCISIONAL BIOPSY Left    cyst excision - Dr. Patty Sermons  . CESAREAN SECTION    . CHOLECYSTECTOMY    . COLONOSCOPY    . EAR CYST EXCISION Right 07/04/2018   Procedure: EXCISION GLOMUS TUMOR THUMBNAIL;  Surgeon: Corky Mull, MD;  Location: [facility] ORS;  Service: Orthopedics;  Laterality: Right;  . ESOPHAGOGASTRODUODENOSCOPY    . ESOPHAGOGASTRODUODENOSCOPY (EGD) WITH PROPOFOL N/A 12/06/2015   Procedure: ESOPHAGOGASTRODUODENOSCOPY (EGD) WITH PROPOFOL;  Surgeon: Manya Silvas, MD;  Location: [facility] ENDOSCOPY;  Service: Endoscopy;  Laterality: N/A;  . IR ANGIO INTRA EXTRACRAN SEL INTERNAL CAROTID UNI L MOD SED  07/15/2019  . IR ANGIO VERTEBRAL SEL SUBCLAVIAN INNOMINATE UNI R MOD SED  07/15/2019  . IR CT HEAD LTD  07/15/2019  . IR INTRAVSC STENT CERV CAROTID W/O EMB-PROT MOD SED INC ANGIO  07/15/2019  . IR PERCUTANEOUS ART THROMBECTOMY/INFUSION INTRACRANIAL INC DIAG ANGIO  07/15/2019  . LAPAROSCOPIC GASTRIC BANDING    . LAPAROSCOPIC GASTRIC RESTRICTIVE DUODENAL PROCEDURE (DUODENAL SWITCH) N/A 01/25/2016   Procedure: LAPAROSCOPIC GASTRIC RESTRICTIVE DUODENAL PROCEDURE (DUODENAL SWITCH);  Surgeon: Ladora Daniel, MD;  Location: [facility] ORS;  Service: General;  Laterality: N/A;  . OVARY SURGERY     x2  . RADIOLOGY WITH ANESTHESIA N/A 07/15/2019   Procedure: IR WITH ANESTHESIA;  Surgeon: Luanne Bras, MD;  Location: [place];  Service: Radiology;  Laterality: N/A;  . TONSILLECTOMY    . TUBAL LIGATION    . UMBILICAL HERNIA REPAIR N/A 01/25/2016  Procedure: LAPAROSCOPIC UMBILICAL HERNIA;  Surgeon: Ladora Daniel, MD;  Location: [facility] ORS;  Service: General;  Laterality: N/A;  . UMBILICAL HERNIA REPAIR N/A 10/20/2016   Procedure: HERNIA REPAIR UMBILICAL ADULT;  Surgeon: Leonie Green, MD;  Location: [facility] ORS;  Service: General;  Laterality: N/A;    There were no vitals filed for this visit.  Subjective Assessment - 10/14/19 1439    Subjective   Pt. reports feeling good today.    Patient is accompanied by:  Family member    Pertinent History  Per pt. chart. Pt. is a 49 y.o. female with history of HTN, T2DM, obesity who was admitted on 07/15/19 with onset of HA progressing to flaccid left hemiparesis a few hours  later.  CT head showed acute nonhemorrhagic infarct in right basal ganglia with hyperdense right MCA sign.  CT A/P head neck showed perfusion deficit with occluded right ICA and slow flow in M1.  She underwent cerebral angiogram with revascularization of right MCA with T1C1 revascularization and repair of right ICA with flow diverter device. Stroke felt to be embolic likely due to ICA dissection. Postprocedure head CT was negative for bleed repeat CTA head neck showed reocclusion of right ICA origin and occlusion of right ICA stent but now with patent right MCA. Pt. received inpatient rehab services for 3 weeks, and home health services.    Limitations  LUE AROM    Currently in Pain?  Yes      OT TREATMENT    Neuro muscular re-education:  Pt. Tolerated weightbearing, and proprioceptive awareness through the LUE, and hand. Pt. Required assist to position the LUE into position for weightbearing. Pt. also worked on alternating weightbearing with functional reaching, and grasping.  Weightbearing, and proprioceptive input was performed tin preparation for ROM, and facilitation of active movement.  Therapeutic Exercise:  Pt. worked on left shoulder stabilization exercises in supine with the shoulder flexed to 90 degrees, and the elbow in extension. Pt. was able to stabilize shoulder. Pt. was able to perform scapular protraction with support provided at the elbow, and forearm. Pt. Worked on hand to face patterns with support at the elbow, and wrist. Pt. Worked on wrist, and digit extension with alternating weightbearing. Pt. Worked on active supination with support at the forearm. Pt. Worked on functional reaching for a short dowel placed on a lower surface in a vertical position with support at the forearm. Pt. Was able to actively relax grip consistently.  Manual Therapy:  Pt. Tolerated scapular mobilizations in elevation, depression, abduction, and rotation in preparation for ROM. Manual techniques were  performed prior to, and independent of there. Ex.  Response to Treatment:  Pt. reports having had a teleconference video visit to determine her eligibility for a neurostimulator study at [facility]. Pt. reports that they determined her LUE was not ready for participation yet, and plans to follow up in a month. Pt. continues to present with LUE decreased LUE AROM, weakness. Pt. was able to actively initiate scapular protraction in supine, left shoulder abduction, active elbow flexion, and extension, active digit extension, and thumb abduction. Pt. continues to work on proximal UE holder stabiliziation/ ROM, and facilitating active wrist, and digit extension. Pt. is worked working towards initiating functional reaching for objects. Pt. continues to work on improving LUE functioning in order to improve,a nd increase engagement in ADLs, and IADL tasks.                     OT Education -  10/14/19 1440    Education Details  Left UE functioning    Person(s) Educated  Patient;Spouse    Methods  Demonstration    Comprehension  Returned demonstration;Verbalized understanding          OT Long Term Goals - 09/08/19 1402      OT LONG TERM GOAL #1   Title  Pt. will increase left shoulder flexion PROM by 20 degrees to assist with UE dressing    Baseline  Eval: Shoulder flexion PROM 80 degrees    Time  12    Period  Weeks    Status  New    Target Date  12/01/19      OT LONG TERM GOAL #2   Title  Pt. will be able to actively stabilize left shoulder for 10 seconds in supine with shoulde flexed to 90 degrees, and elbow in extension in preparation for functional reaching.    Baseline  Eval: Pt.is unable to stabilize left shoulder    Time  12    Period  Weeks    Status  New    Target Date  12/01/19      OT LONG TERM GOAL #3   Title  Pt. will independently perform hand to face patterns with the LUE in preparation for light self grooming tasks.    Baseline  Eval: Pt. is unable to perform  hand to face patterns with the LUE.    Time  12    Period  Weeks    Status  New    Target Date  12/01/19      OT LONG TERM GOAL #4   Title  Pt. will initiate 10 degrees of active left wrist extension  in preparation for actively lifting her hand off of a surface.    Baseline  Eval: Pt. is unable    Time  12    Period  Weeks    Status  New    Target Date  12/01/19      OT LONG TERM GOAL #5   Title  Pt. will initiate left hand digit extension in order to be able to actively release an objects during ADL tasks.    Baseline  Eval: Pt. is unable    Time  12    Period  Weeks    Status  New    Target Date  12/01/19      Long Term Additional Goals   Additional Long Term Goals  Yes      OT LONG TERM GOAL #6   Title  Pt. increase left hand digit flexion in order to be able to hold her toothbrush while applying toothpaste with her left right hand.    Baseline  Eval: Pt. is unable    Time  12    Period  Weeks    Status  New    Target Date  12/01/19      OT LONG TERM GOAL #7   Title  --            Plan - 10/14/19 1441    Clinical Impression Statement  Pt. reports having had a teleconference video visit to determine her eligibility for a neurostimulator study at [facility]. Pt. reports that they determined her LUE was not ready for participation yet, and plans to follow up in a month. Pt. continues to present with LUE decreased LUE AROM, weakness. Pt. was able to actively initiate scapular protraction in supine, left shoulder abduction, active elbow flexion, and extension, active  digit extension, and thumb abduction. Pt. continues to work on proximal UE holder stabiliziation/ ROM, and facilitating active wrist, and digit extension. Pt. is worked working towards initiating functional reaching for objects. Pt. continues to work on improving LUE functioning in order to improve, and increase engagement in ADLs, and IADL tasks.    Occupational performance deficits (Please refer to evaluation for  details):  ADL's;IADL's    Rehab Potential  Good    Clinical Decision Making  Several treatment options, min-mod task modification necessary    Comorbidities Affecting Occupational Performance:  May have comorbidities impacting occupational performance    Modification or Assistance to Complete Evaluation   Min-Moderate modification of tasks or assist with assess necessary to complete eval    OT Frequency  2x / week    OT Duration  12 weeks    OT Treatment/Interventions  Self-care/ADL training;Moist Heat;DME and/or AE instruction;Neuromuscular education;Therapeutic exercise;Therapeutic activities;Energy conservation;Patient/family education    Consulted and Agree with Plan of Care  Patient       Patient will benefit from skilled therapeutic intervention in order to improve the following deficits and impairments:           Visit Diagnosis: Muscle weakness (generalized)  Other lack of coordination    Problem List Patient Active Problem List   Diagnosis Date Noted  . E. coli UTI   . Diabetes mellitus type 2 in obese ([place])   . Chronic pain of right knee   . 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 Hemiparesis affecting left side as late effect of stroke ([place])   . Acute ischemic right middle cerebral artery (MCA) stroke ([place]) 07/22/2019  . Carotid artery dissection  (HCC) s/p stent placement 07/21/2019  . Diabetes mellitus type II, uncontrolled ([place]) 07/21/2019  . Acute blood loss anemia 07/21/2019  . Leukocytosis 07/21/2019  . Stroke (cerebrum) ([place]) - R MCA infarct s/p tenecteplase and mechanical thrombectomy w/ d/t ICA dissection  07/15/2019  . Middle cerebral artery embolism, right 07/15/2019  . Controlled type 2 diabetes mellitus without complication, without long-term current use of insulin ([place]) 05/28/2017  . Attention deficit hyperactivity disorder (ADHD), combined type 04/18/2017  . Morbid obesity ([place]) 01/25/2016  . Umbilical hernia [id number]  . Hiatal hernia  12/08/2015  . History of Clostridium difficile colitis 06/19/2014  . HTN (hypertension) 12/23/2013  . PCOS (polycystic ovarian syndrome) 12/23/2013  . Bariatric surgery status 01/28/2013    Harrel Carina, MS, OTR/L 10/14/2019, 3:00 PM  [place] MAIN [facility] SERVICES [address] [place], [place], [address] Phone: [phone]   Fax:  [phone]  Name: Kathryn Coffey MRN: [id number] Date of Birth: 09-Aug-1971

## 2019-10-16 ENCOUNTER — Ambulatory Visit: Payer: No Typology Code available for payment source | Admitting: Physical Therapy

## 2019-10-16 ENCOUNTER — Other Ambulatory Visit: Payer: Self-pay

## 2019-10-16 DIAGNOSIS — R262 Difficulty in walking, not elsewhere classified: Secondary | ICD-10-CM

## 2019-10-16 DIAGNOSIS — M6281 Muscle weakness (generalized): Secondary | ICD-10-CM | POA: Diagnosis not present

## 2019-10-16 DIAGNOSIS — R278 Other lack of coordination: Secondary | ICD-10-CM

## 2019-10-17 ENCOUNTER — Other Ambulatory Visit: Payer: Self-pay

## 2019-10-17 ENCOUNTER — Ambulatory Visit (INDEPENDENT_AMBULATORY_CARE_PROVIDER_SITE_OTHER): Payer: No Typology Code available for payment source | Admitting: Cardiology

## 2019-10-17 ENCOUNTER — Encounter: Payer: Self-pay | Admitting: Cardiology

## 2019-10-17 ENCOUNTER — Encounter: Payer: Self-pay | Admitting: Physical Therapy

## 2019-10-17 VITALS — BP 118/78 | HR 90 | Ht 65.0 in | Wt 233.0 lb

## 2019-10-17 DIAGNOSIS — I639 Cerebral infarction, unspecified: Secondary | ICD-10-CM

## 2019-10-17 DIAGNOSIS — M6281 Muscle weakness (generalized): Secondary | ICD-10-CM | POA: Diagnosis not present

## 2019-10-17 NOTE — Therapy (Signed)
[place] MAIN [facility] SERVICES [address] [place], [place], [address] Phone: [phone]   Fax:  [phone]  Physical Therapy Treatment  Patient Details  Name: Kathryn Coffey MRN: [id number] Date of Birth: [birth year] No data recorded  Encounter Date: 10/16/2019  PT End of Session - 10/17/19 0930    Visit Number  7    Number of Visits  36    Date for PT Re-Evaluation  11/03/19    PT Start Time  0145    PT Stop Time  0230    PT Time Calculation (min)  45 min    Equipment Utilized During Treatment  Gait belt    Activity Tolerance  Patient tolerated treatment well    Behavior During Therapy  WFL for tasks assessed/performed       Past Medical History:  Diagnosis Date  . Abdominal wall cellulitis 2013 and 2014  . Asthma    allergic to grasses  . B12 deficiency   . Complication of anesthesia   . Costochondritis   . Diabetes mellitus without complication ([place])   . Gastric anomaly    multiple small ulcers  . GERD (gastroesophageal reflux disease)   . Herpes 06/2018   POSSIBLY IN EYE- PT TAKING VALTREX AND WILL SEE OPTHAMOLOGIST TO SEE IF VALTREX IS WORKING  . History of abnormal cervical Pap smear   . History of Clostridium difficile colitis   . History of hiatal hernia   . History of kidney stones   . Hypertension   . IBS (irritable bowel syndrome)   . Migraine   . Morbid obesity ([place])    s/p attempted gastric banding now decompressed  . PCOS (polycystic ovarian syndrome)   . PONV (postoperative nausea and vomiting)    WITH SPINAL ONLY  . Stroke [facility])     Past Surgical History:  Procedure Laterality Date  . BREAST EXCISIONAL BIOPSY Left    cyst excision - Dr. Patty Sermons  . CESAREAN SECTION    . CHOLECYSTECTOMY    . COLONOSCOPY    . EAR CYST EXCISION Right 07/04/2018   Procedure: EXCISION GLOMUS TUMOR THUMBNAIL;  Surgeon: Corky Mull, MD;  Location: [facility] ORS;  Service: Orthopedics;  Laterality: Right;  .  ESOPHAGOGASTRODUODENOSCOPY    . ESOPHAGOGASTRODUODENOSCOPY (EGD) WITH PROPOFOL N/A 12/06/2015   Procedure: ESOPHAGOGASTRODUODENOSCOPY (EGD) WITH PROPOFOL;  Surgeon: Manya Silvas, MD;  Location: [facility] ENDOSCOPY;  Service: Endoscopy;  Laterality: N/A;  . IR ANGIO INTRA EXTRACRAN SEL INTERNAL CAROTID UNI L MOD SED  07/15/2019  . IR ANGIO VERTEBRAL SEL SUBCLAVIAN INNOMINATE UNI R MOD SED  07/15/2019  . IR CT HEAD LTD  07/15/2019  . IR INTRAVSC STENT CERV CAROTID W/O EMB-PROT MOD SED INC ANGIO  07/15/2019  . IR PERCUTANEOUS ART THROMBECTOMY/INFUSION INTRACRANIAL INC DIAG ANGIO  07/15/2019  . LAPAROSCOPIC GASTRIC BANDING    . LAPAROSCOPIC GASTRIC RESTRICTIVE DUODENAL PROCEDURE (DUODENAL SWITCH) N/A 01/25/2016   Procedure: LAPAROSCOPIC GASTRIC RESTRICTIVE DUODENAL PROCEDURE (DUODENAL SWITCH);  Surgeon: Ladora Daniel, MD;  Location: [facility] ORS;  Service: General;  Laterality: N/A;  . OVARY SURGERY     x2  . RADIOLOGY WITH ANESTHESIA N/A 07/15/2019   Procedure: IR WITH ANESTHESIA;  Surgeon: Luanne Bras, MD;  Location: [place];  Service: Radiology;  Laterality: N/A;  . TONSILLECTOMY    . TUBAL LIGATION    . UMBILICAL HERNIA REPAIR N/A 01/25/2016   Procedure: LAPAROSCOPIC UMBILICAL HERNIA;  Surgeon: Ladora Daniel, MD;  Location: [facility] ORS;  Service: General;  Laterality: N/A;  . UMBILICAL HERNIA REPAIR N/A 10/20/2016   Procedure: HERNIA REPAIR UMBILICAL ADULT;  Surgeon: Leonie Green, MD;  Location: [facility] ORS;  Service: General;  Laterality: N/A;    There were no vitals filed for this visit.  Subjective Assessment - 10/17/19 0926    Subjective  Pt reports that she was able to ambulate down the driveway with her quad cane earlier today without any problems or loss of balance.  She does state that the toes on her left foot have been "curling up" and she has been stretching them.    Pertinent History  Patient was in the hospital [place] and then in patient rehab 3 weeks. She was dicharged  08/13/19 and then HHPT and was discharged 09/03/19. She has not had any falls.    Currently in Pain?  No/denies        Treatment:  Gait Training:  Ambulation in lower level of hospital with [facility] (approximately 300 meters) with cueing for improved L foot placement and decreased L foot drag.  Therapeutic Exercise:  Bridging 2x10; SLR 2x10; LAQs 2x10; HS curls with BTB 2x10; hooklying abd/er with BTB 20x; heel/toe raises 20x.   NMR: Weightshifting onto L LE with R LE on foam pad in // bars without UE support; Alt toe tapping on 6" step without UE support; tandem stance on line in // bars without UE support; Step ups onto R LE 2x10; Weightshifts onto L LE into a lunge on 6" step 2x10.                       PT Education - 10/17/19 0929    Education Details  exercise technique, body mechanics, weigh shift    Person(s) Educated  Patient    Methods  Explanation;Demonstration;Tactile cues;Verbal cues    Comprehension  Verbalized understanding;Returned demonstration;Verbal cues required       PT Short Term Goals - 09/08/19 1218      PT SHORT TERM GOAL #1   Title  Patient will be independent in home exercise program to improve strength/mobility for better functional independence with ADLs.    Time  6    Period  Weeks    Status  New    Target Date  10/20/19      PT SHORT TERM GOAL #2   Title  Patient (> 54 years old) will complete five times sit to stand test in < 15 seconds indicating an increased LE strength and improved balance.    Time  6    Period  Weeks    Status  New    Target Date  10/20/19        PT Long Term Goals - 09/08/19 1222      PT LONG TERM GOAL #1   Title  Patient will increase Berg Balance score by > 6 points to demonstrate decreased fall risk during functional activities.    Time  12    Period  Weeks    Status  New    Target Date  12/01/19      PT LONG TERM GOAL #2   Title  Patient will increase six minute walk test distance to >1200  for progression to community ambulator and improve gait ability    Time  12    Period  Weeks    Status  New    Target Date  12/01/19      PT LONG TERM GOAL #3  Title  Patient will increase 10 meter walk test to >1.8m/s as to improve gait speed for better community ambulation and to reduce fall risk    Time  12    Period  Weeks    Status  New    Target Date  12/01/19            Plan - 10/17/19 0931    Clinical Impression Statement  Pt exhibited increased foot drag of L foot after focusing on weightshifting activities, especially L foot lunges onto step, today.  She continues to have some L knee instability with ambulation with SBQC, but has improved foot placement and heel strike.    Personal Factors and Comorbidities  Comorbidity 1    Comorbidities  diabetes, high blood pressure, reflux,    Examination-Activity Limitations  Carry;Caring for Others;Bend;Locomotion Level;Self Feeding;Sleep;Toileting    Examination-Participation Restrictions  Cleaning;Community Activity;Driving;Laundry;Shop    Stability/Clinical Decision Making  Stable/Uncomplicated    Clinical Decision Making  Low    Rehab Potential  Good    PT Frequency  2x / week    PT Duration  12 weeks    PT Treatment/Interventions  Manual techniques;Functional mobility training;Stair training;Gait training;Electrical Stimulation;Moist Heat;Ultrasound;Therapeutic exercise;Balance training;Neuromuscular re-education    PT Next Visit Plan  strengthening and balance    PT Home Exercise Plan  standing hip abd,    Consulted and Agree with Plan of Care  Patient       Patient will benefit from skilled therapeutic intervention in order to improve the following deficits and impairments:  Abnormal gait, Decreased knowledge of use of DME, Dizziness, Pain, Decreased coordination, Impaired UE functional use, Decreased strength, Decreased endurance, Decreased activity tolerance, Decreased balance, Difficulty walking, Obesity  Visit  Diagnosis: Muscle weakness (generalized)  Other lack of coordination  Difficulty in walking, not elsewhere classified     Problem List Patient Active Problem List   Diagnosis Date Noted  . E. coli UTI   . Diabetes mellitus type 2 in obese ([place])   . Chronic pain of right knee   . 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 Hemiparesis affecting left side as late effect of stroke ([place])   . Acute ischemic right middle cerebral artery (MCA) stroke ([place]) 07/22/2019  . Carotid artery dissection  (HCC) s/p stent placement 07/21/2019  . Diabetes mellitus type II, uncontrolled ([place]) 07/21/2019  . Acute blood loss anemia 07/21/2019  . Leukocytosis 07/21/2019  . Stroke (cerebrum) ([place]) - R MCA infarct s/p tenecteplase and mechanical thrombectomy w/ d/t ICA dissection  07/15/2019  . Middle cerebral artery embolism, right 07/15/2019  . Controlled type 2 diabetes mellitus without complication, without long-term current use of insulin ([place]) 05/28/2017  . Attention deficit hyperactivity disorder (ADHD), combined type 04/18/2017  . Morbid obesity ([place]) 01/25/2016  . Umbilical hernia [id number]  . Hiatal hernia 12/08/2015  . History of Clostridium difficile colitis 06/19/2014  . HTN (hypertension) 12/23/2013  . PCOS (polycystic ovarian syndrome) 12/23/2013  . Bariatric surgery status 01/28/2013    Branson Kranz, MPT 10/17/2019, 9:35 AM  [place] MAIN [facility] SERVICES [address] [place], [place], [address] Phone: [phone]   Fax:  [phone]  Name: Kathryn Coffey MRN: [id number] Date of Birth: [birth year]

## 2019-10-17 NOTE — Patient Instructions (Signed)
Medication Instructions:  Your physician recommends that you continue on your current medications as directed. Please refer to the Current Medication list given to you today.  *If you need a refill on your cardiac medications before your next appointment, please call your pharmacy*   Lab Work: None ordered  If you have labs (blood work) drawn today and your tests are completely normal, you will receive your results only by: Marland Kitchen MyChart Message (if you have MyChart) OR . A paper copy in the mail If you have any lab test that is abnormal or we need to change your treatment, we will call you to review the results.   Testing/Procedures: None ordered    Follow-Up: At [facility], you and your health needs are our priority.  As part of our continuing mission to provide you with exceptional heart care, we have created designated Provider Care Teams.  These Care Teams include your primary Cardiologist (physician) and Advanced Practice Providers (APPs -  Physician Assistants and Nurse Practitioners) who all work together to provide you with the care you need, when you need it.  We recommend signing up for the patient portal called "MyChart".  Sign up information is provided on this After Visit Summary.  MyChart is used to connect with patients for Virtual Visits (Telemedicine).  Patients are able to view lab/test results, encounter notes, upcoming appointments, etc.  Non-urgent messages can be sent to your provider as well.   To learn more about what you can do with MyChart, go to [email or web address].    Your next appointment:   PRN

## 2019-10-17 NOTE — Patient Outreach (Signed)
Telephone outreach to patient to obtain mRS was successfully completed. MRS=3   Kathryn Coffey Care Management Assistant  

## 2019-10-17 NOTE — Progress Notes (Signed)
Cardiology Office Note:    Date:  10/17/2019   ID:  Kathryn Coffey, DOB 1970-09-26, MRN [id number]  PCP:  Rusty Aus, MD  Cardiologist:  Kate Sable, MD  Electrophysiologist:  None   Referring MD: Rusty Aus, MD   Chief Complaint  Patient presents with  . office visit    Pt concerned w/ previous EKG & recent stroke. Meds verbally reviewed w/ pt.    History of Present Illness:    Kathryn Coffey is a 49 y.o. female with a hx of diabetes, hypertension, obesity, CVA who presents for follow-up.  She was last seen due to recent CVA.  Patient was admitted to the hospital/[place] for headaches.  Headaches progressed to flaccid left hemiparesis a few hours later.  Head CT showed acute nonhemorrhagic infarct in the right basal ganglia with hyperdense right MCA suggesting thrombus.  Patient diagnosed with occluded right middle cerebral artery M1 segment and underwent an emergent large vessel occlusion thrombolysis.  Stroke was deemed likely cardioembolic.  Lower extremity Doppler was negative for DVT.   Echocardiogram showed normal ejection fraction with restrictive diastolic function.  Doppler evidence of ASD noted.  Patient has since been working with physical therapy on discharge.  Still has difficulty lifting left arm, able to walk with walker with slight weakness on left leg.  Cardiac monitor was placed to evaluate for atrial fibrillation or any arrhythmias.  Past Medical History:  Diagnosis Date  . Abdominal wall cellulitis 2013 and 2014  . Asthma    allergic to grasses  . B12 deficiency   . Complication of anesthesia   . Costochondritis   . Diabetes mellitus without complication ([place])   . Gastric anomaly    multiple small ulcers  . GERD (gastroesophageal reflux disease)   . Herpes 06/2018   POSSIBLY IN EYE- PT TAKING VALTREX AND WILL SEE OPTHAMOLOGIST TO SEE IF VALTREX IS WORKING  . History of abnormal cervical Pap smear   . History of Clostridium difficile  colitis   . History of hiatal hernia   . History of kidney stones   . Hypertension   . IBS (irritable bowel syndrome)   . Migraine   . Morbid obesity ([place])    s/p attempted gastric banding now decompressed  . PCOS (polycystic ovarian syndrome)   . PONV (postoperative nausea and vomiting)    WITH SPINAL ONLY  . Stroke [facility])     Past Surgical History:  Procedure Laterality Date  . BREAST EXCISIONAL BIOPSY Left    cyst excision - Dr. Patty Sermons  . CESAREAN SECTION    . CHOLECYSTECTOMY    . COLONOSCOPY    . EAR CYST EXCISION Right 07/04/2018   Procedure: EXCISION GLOMUS TUMOR THUMBNAIL;  Surgeon: Corky Mull, MD;  Location: [facility] ORS;  Service: Orthopedics;  Laterality: Right;  . ESOPHAGOGASTRODUODENOSCOPY    . ESOPHAGOGASTRODUODENOSCOPY (EGD) WITH PROPOFOL N/A 12/06/2015   Procedure: ESOPHAGOGASTRODUODENOSCOPY (EGD) WITH PROPOFOL;  Surgeon: Manya Silvas, MD;  Location: [facility] ENDOSCOPY;  Service: Endoscopy;  Laterality: N/A;  . IR ANGIO INTRA EXTRACRAN SEL INTERNAL CAROTID UNI L MOD SED  07/15/2019  . IR ANGIO VERTEBRAL SEL SUBCLAVIAN INNOMINATE UNI R MOD SED  07/15/2019  . IR CT HEAD LTD  07/15/2019  . IR INTRAVSC STENT CERV CAROTID W/O EMB-PROT MOD SED INC ANGIO  07/15/2019  . IR PERCUTANEOUS ART THROMBECTOMY/INFUSION INTRACRANIAL INC DIAG ANGIO  07/15/2019  . LAPAROSCOPIC GASTRIC BANDING    . LAPAROSCOPIC GASTRIC RESTRICTIVE DUODENAL PROCEDURE (  DUODENAL SWITCH) N/A 01/25/2016   Procedure: LAPAROSCOPIC GASTRIC RESTRICTIVE DUODENAL PROCEDURE (DUODENAL SWITCH);  Surgeon: Ladora Daniel, MD;  Location: [facility] ORS;  Service: General;  Laterality: N/A;  . OVARY SURGERY     x2  . RADIOLOGY WITH ANESTHESIA N/A 07/15/2019   Procedure: IR WITH ANESTHESIA;  Surgeon: Luanne Bras, MD;  Location: [place];  Service: Radiology;  Laterality: N/A;  . TONSILLECTOMY    . TUBAL LIGATION    . UMBILICAL HERNIA REPAIR N/A 01/25/2016   Procedure: LAPAROSCOPIC UMBILICAL HERNIA;  Surgeon: Ladora Daniel, MD;  Location: [facility] ORS;  Service: General;  Laterality: N/A;  . UMBILICAL HERNIA REPAIR N/A 10/20/2016   Procedure: HERNIA REPAIR UMBILICAL ADULT;  Surgeon: Leonie Green, MD;  Location: [facility] ORS;  Service: General;  Laterality: N/A;    Current Medications: Current Meds  Medication Sig  . acetaminophen (TYLENOL) 325 MG tablet Take 1-2 tablets (325-650 mg total) by mouth every 4 (four) hours as needed for mild pain.  Marland Kitchen albuterol (PROVENTIL HFA;VENTOLIN HFA) 108 (90 Base) MCG/ACT inhaler Inhale 2 puffs into the lungs every 6 (six) hours as needed for wheezing or shortness of breath.  . amphetamine-dextroamphetamine (ADDERALL) 10 MG tablet Take 1 tablet (10 mg total) by mouth 2 (two) times daily with a meal.  . aspirin 81 MG chewable tablet Chew 1 tablet (81 mg total) by mouth daily.  Marland Kitchen buPROPion (WELLBUTRIN) 100 MG tablet TAKE 1 TABLET BY MOUTH TWICE DAILY  . Calcium Carb-Cholecalciferol (CALCIUM + VITAMIN D3 PO) Take 1 tablet by mouth daily.  . ferrous sulfate 325 (65 FE) MG tablet Take 1 tablet (325 mg total) by mouth daily with breakfast.  . fexofenadine (ALLEGRA) 180 MG tablet Take 180 mg by mouth daily as needed for allergies.   . fluticasone (FLONASE) 50 MCG/ACT nasal spray Place 2 sprays into both nostrils daily as needed for allergies or rhinitis.   Marland Kitchen liraglutide (VICTOZA) 18 MG/3ML SOPN Inject 0.2 mLs (1.2 mg total) into the skin daily.  Marland Kitchen losartan (COZAAR) 25 MG tablet Take 1 tablet (25 mg total) by mouth daily.  . Menthol-Methyl Salicylate (MUSCLE RUB) 10-15 % CREA Apply 1 application topically 2 (two) times daily as needed for muscle pain.  . metFORMIN (GLUCOPHAGE) 500 MG tablet Take 500 mg by mouth 2 (two) times daily.  . methocarbamol (ROBAXIN) 500 MG tablet Take 1 tablet (500 mg total) by mouth 4 (four) times daily.  . mirabegron ER (MYRBETRIQ) 25 MG TB24 tablet Take 1 tablet (25 mg total) by mouth daily.  . Multiple Vitamins-Minerals (MULTIVITAMIN PO) Take 2 tablets  by mouth daily.  . NON FORMULARY 1 Dose once a week. Wednesdays  . omeprazole (PRILOSEC) 40 MG capsule Take 40 mg by mouth daily. Dose unknown  . ticagrelor (BRILINTA) 90 MG TABS tablet Take 1 tablet (90 mg total) by mouth 2 (two) times daily.  Marland Kitchen tiZANidine (ZANAFLEX) 4 MG tablet Take 1 tablet (4 mg total) by mouth at bedtime.  . topiramate (TOPAMAX) 25 MG tablet Take 1 tablet (25 mg total) by mouth at bedtime.     Allergies:   Codeine, Azithromycin, Ivp dye [iodinated diagnostic agents], Lexapro [escitalopram], and Vicodin [hydrocodone-acetaminophen]   Social History   Socioeconomic History  . Marital status: Married    Spouse name: Not on file  . Number of children: Not on file  . Years of education: Not on file  . Highest education level: Not on file  Occupational History  . Not on file  Tobacco Use  . Smoking status: Never Smoker  . Smokeless tobacco: Never Used  Substance and Sexual Activity  . Alcohol use: Yes    Alcohol/week: 0.0 standard drinks    Comment: SOCIAL  . Drug use: No  . Sexual activity: Yes    Birth control/protection: I.U.D., Other-see comments    Comment: BTL  Other Topics Concern  . Not on file  Social History Narrative  . Not on file   Social Determinants of Health   Financial Resource Strain:   . Difficulty of Paying Living Expenses: Not on file  Food Insecurity:   . Worried About Charity fundraiser in the Last Year: Not on file  . Ran Out of Food in the Last Year: Not on file  Transportation Needs:   . Lack of Transportation (Medical): Not on file  . Lack of Transportation (Non-Medical): Not on file  Physical Activity:   . Days of Exercise per Week: Not on file  . Minutes of Exercise per Session: Not on file  Stress:   . Feeling of Stress : Not on file  Social Connections:   . Frequency of Communication with Friends and Family: Not on file  . Frequency of Social Gatherings with Friends and Family: Not on file  . Attends Religious  Services: Not on file  . Active Member of Clubs or Organizations: Not on file  . Attends Archivist Meetings: Not on file  . Marital Status: Not on file     Family History: The patient's family history includes Breast cancer (age of onset: 34) in her maternal grandmother; Diabetes in her father and mother; Heart attack in her father.  ROS:   Please see the history of present illness.     All other systems reviewed and are negative.  EKGs/Labs/Other Studies Reviewed:    The following studies were reviewed today:   EKG:  EKG is  ordered today.  The ekg ordered today demonstrates normal sinus rhythm, nonspecific T wave changes.  Recent Labs: 07/23/2019: ALT 27 08/11/2019: BUN 15; Creatinine, Ser 0.63; Hemoglobin 9.5; Platelets 422; Potassium 3.6; Sodium 139  Recent Lipid Panel    Component Value Date/Time   CHOL 104 09/18/2019 1344   TRIG 170 (H) 09/18/2019 1344   HDL 36 (L) 09/18/2019 1344   CHOLHDL 2.9 09/18/2019 1344   CHOLHDL 5.0 07/16/2019 0450   VLDL 62 (H) 07/16/2019 0450   LDLCALC 40 09/18/2019 1344    Physical Exam:    VS:  BP 118/78 (BP Location: Right Arm, Patient Position: Sitting, Cuff Size: Normal)   Pulse 90   Ht 5\' 5"  (1.651 m)   Wt 233 lb (105.7 kg)   SpO2 98%   BMI 38.77 kg/m     Wt Readings from Last 3 Encounters:  10/17/19 233 lb (105.7 kg)  09/18/19 240 lb (108.9 kg)  09/18/19 239 lb (108.4 kg)     GEN:  Well nourished, well developed in no acute distress, obese HEENT: Normal NECK: No JVD; No carotid bruits LYMPHATICS: No lymphadenopathy CARDIAC: RRR, no murmurs, rubs, gallops RESPIRATORY:  Clear to auscultation without rales, wheezing or rhonchi  ABDOMEN: Soft, non-tender, non-distended MUSCULOSKELETAL:  No edema; left arm weakness noted SKIN: Warm and dry NEUROLOGIC:  Alert and oriented x 3 PSYCHIATRIC:  Normal affect   ASSESSMENT:    1. Cerebrovascular accident (CVA), unspecified mechanism ([place])   2. Morbid obesity ([place])     PLAN:    In order of problems listed above:  1. Patient with right MCA infarct 2 months ago.  Echocardiogram with normal ejection fraction and restrictive filling pattern on report.  EKG so far with no A. fib or flutter.  Cardiac monitor x2 weeks did not reveal any evidence for atrial fib or flutter.   Patient takes aspirin 81 mg daily.  Last cholesterol levels shows normal total cholesterol and low LDL at 41.  Triglycerides level elevated and patient is being managed by primary care provider.  She also has an appointment to follow-up with neurology. 2. Morbid obesity.  Weight loss advised.  Follow-up as needed  This note was generated in part or whole with voice recognition software. Voice recognition is usually quite accurate but there are transcription errors that can and very often do occur. I apologize for any typographical errors that were not detected and corrected.  Medication Adjustments/Labs and Tests Ordered: Current medicines are reviewed at length with the patient today.  Concerns regarding medicines are outlined above.  Orders Placed This Encounter  Procedures  . EKG 12-Lead   No orders of the defined types were placed in this encounter.   Patient Instructions  Medication Instructions:  Your physician recommends that you continue on your current medications as directed. Please refer to the Current Medication list given to you today.  *If you need a refill on your cardiac medications before your next appointment, please call your pharmacy*   Lab Work: None ordered  If you have labs (blood work) drawn today and your tests are completely normal, you will receive your results only by: Marland Kitchen MyChart Message (if you have MyChart) OR . A paper copy in the mail If you have any lab test that is abnormal or we need to change your treatment, we will call you to review the results.   Testing/Procedures: None ordered    Follow-Up: At [facility], you and your health needs  are our priority.  As part of our continuing mission to provide you with exceptional heart care, we have created designated Provider Care Teams.  These Care Teams include your primary Cardiologist (physician) and Advanced Practice Providers (APPs -  Physician Assistants and Nurse Practitioners) who all work together to provide you with the care you need, when you need it.  We recommend signing up for the patient portal called "MyChart".  Sign up information is provided on this After Visit Summary.  MyChart is used to connect with patients for Virtual Visits (Telemedicine).  Patients are able to view lab/test results, encounter notes, upcoming appointments, etc.  Non-urgent messages can be sent to your provider as well.   To learn more about what you can do with MyChart, go to [email or web address].    Your next appointment:   PRN    Signed, Kate Sable, MD  10/17/2019 5:25 PM    [facility]

## 2019-10-20 ENCOUNTER — Ambulatory Visit: Payer: No Typology Code available for payment source

## 2019-10-20 ENCOUNTER — Ambulatory Visit
Payer: No Typology Code available for payment source | Attending: Physical Medicine and Rehabilitation | Admitting: Occupational Therapy

## 2019-10-20 ENCOUNTER — Encounter: Payer: Self-pay | Admitting: Occupational Therapy

## 2019-10-20 ENCOUNTER — Other Ambulatory Visit: Payer: Self-pay | Admitting: Physical Medicine and Rehabilitation

## 2019-10-20 ENCOUNTER — Other Ambulatory Visit: Payer: Self-pay

## 2019-10-20 DIAGNOSIS — M6281 Muscle weakness (generalized): Secondary | ICD-10-CM

## 2019-10-20 DIAGNOSIS — R262 Difficulty in walking, not elsewhere classified: Secondary | ICD-10-CM | POA: Diagnosis present

## 2019-10-20 DIAGNOSIS — R2689 Other abnormalities of gait and mobility: Secondary | ICD-10-CM

## 2019-10-20 DIAGNOSIS — R278 Other lack of coordination: Secondary | ICD-10-CM

## 2019-10-20 MED ORDER — TIZANIDINE HCL 2 MG PO CAPS: 2.0000 mg | ORAL_CAPSULE | Freq: Three times a day (TID) | ORAL | 2 refills | Status: DC | PRN

## 2019-10-20 MED ORDER — TIZANIDINE HCL 4 MG PO TABS: 4.0000 mg | ORAL_TABLET | Freq: Every day | ORAL | 2 refills | Status: DC

## 2019-10-20 NOTE — Therapy (Addendum)
[place] MAIN [facility] SERVICES [address] [place], [place], [address] Phone: [phone]   Fax:  [phone]  Occupational Therapy Treatment  Patient Details  Name: Kathryn Coffey MRN: [id number] Date of Birth: December 04, 1970 Referring Provider (OT): Raulkar   Encounter Date: 10/20/2019  OT End of Session - 10/20/19 1522    Visit Number  7    Number of Visits  24    Date for OT Re-Evaluation  12/01/19    Authorization Type  Progress report period starting  on 09/08/2019    OT Start Time  1435    OT Stop Time  1515    OT Time Calculation (min)  40 min    Activity Tolerance  Patient tolerated treatment well    Behavior During Therapy  [facility] for tasks assessed/performed       Past Medical History:  Diagnosis Date  . Abdominal wall cellulitis 2013 and 2014  . Asthma    allergic to grasses  . B12 deficiency   . Complication of anesthesia   . Costochondritis   . Diabetes mellitus without complication ([place])   . Gastric anomaly    multiple small ulcers  . GERD (gastroesophageal reflux disease)   . Herpes 06/2018   POSSIBLY IN EYE- PT TAKING VALTREX AND WILL SEE OPTHAMOLOGIST TO SEE IF VALTREX IS WORKING  . History of abnormal cervical Pap smear   . History of Clostridium difficile colitis   . History of hiatal hernia   . History of kidney stones   . Hypertension   . IBS (irritable bowel syndrome)   . Migraine   . Morbid obesity ([place])    s/p attempted gastric banding now decompressed  . PCOS (polycystic ovarian syndrome)   . PONV (postoperative nausea and vomiting)    WITH SPINAL ONLY  . Stroke [facility])     Past Surgical History:  Procedure Laterality Date  . BREAST EXCISIONAL BIOPSY Left    cyst excision - Dr. Patty Sermons  . CESAREAN SECTION    . CHOLECYSTECTOMY    . COLONOSCOPY    . EAR CYST EXCISION Right 07/04/2018   Procedure: EXCISION GLOMUS TUMOR THUMBNAIL;  Surgeon: Corky Mull, MD;  Location: [facility] ORS;  Service:  Orthopedics;  Laterality: Right;  . ESOPHAGOGASTRODUODENOSCOPY    . ESOPHAGOGASTRODUODENOSCOPY (EGD) WITH PROPOFOL N/A 12/06/2015   Procedure: ESOPHAGOGASTRODUODENOSCOPY (EGD) WITH PROPOFOL;  Surgeon: Manya Silvas, MD;  Location: [facility] ENDOSCOPY;  Service: Endoscopy;  Laterality: N/A;  . IR ANGIO INTRA EXTRACRAN SEL INTERNAL CAROTID UNI L MOD SED  07/15/2019  . IR ANGIO VERTEBRAL SEL SUBCLAVIAN INNOMINATE UNI R MOD SED  07/15/2019  . IR CT HEAD LTD  07/15/2019  . IR INTRAVSC STENT CERV CAROTID W/O EMB-PROT MOD SED INC ANGIO  07/15/2019  . IR PERCUTANEOUS ART THROMBECTOMY/INFUSION INTRACRANIAL INC DIAG ANGIO  07/15/2019  . LAPAROSCOPIC GASTRIC BANDING    . LAPAROSCOPIC GASTRIC RESTRICTIVE DUODENAL PROCEDURE (DUODENAL SWITCH) N/A 01/25/2016   Procedure: LAPAROSCOPIC GASTRIC RESTRICTIVE DUODENAL PROCEDURE (DUODENAL SWITCH);  Surgeon: Ladora Daniel, MD;  Location: [facility] ORS;  Service: General;  Laterality: N/A;  . OVARY SURGERY     x2  . RADIOLOGY WITH ANESTHESIA N/A 07/15/2019   Procedure: IR WITH ANESTHESIA;  Surgeon: Luanne Bras, MD;  Location: [place];  Service: Radiology;  Laterality: N/A;  . TONSILLECTOMY    . TUBAL LIGATION    . UMBILICAL HERNIA REPAIR N/A 01/25/2016   Procedure: LAPAROSCOPIC UMBILICAL HERNIA;  Surgeon: Ladora Daniel,  MD;  Location: [facility] ORS;  Service: General;  Laterality: N/A;  . UMBILICAL HERNIA REPAIR N/A 10/20/2016   Procedure: HERNIA REPAIR UMBILICAL ADULT;  Surgeon: Leonie Green, MD;  Location: [facility] ORS;  Service: General;  Laterality: N/A;    There were no vitals filed for this visit.  Subjective Assessment - 10/20/19 1521    Subjective   Pt. reports feeling good today.    Patient is accompanied by:  Family member    Pertinent History  Per pt. chart. Pt. is a 49 y.o. female with history of HTN, T2DM, obesity who was admitted on 07/15/19 with onset of HA progressing to flaccid left hemiparesis a few hours later.  CT head showed acute nonhemorrhagic  infarct in right basal ganglia with hyperdense right MCA sign.  CT A/P head neck showed perfusion deficit with occluded right ICA and slow flow in M1.  She underwent cerebral angiogram with revascularization of right MCA with T1C1 revascularization and repair of right ICA with flow diverter device. Stroke felt to be embolic likely due to ICA dissection. Postprocedure head CT was negative for bleed repeat CTA head neck showed reocclusion of right ICA origin and occlusion of right ICA stent but now with patent right MCA. Pt. received inpatient rehab services for 3 weeks, and home health services.    Currently in Pain?  No/denies       OT TREATMENT    Neuro muscular re-education:  Pt. Tolerated weightbearing, and proprioceptive awareness through the LUE, and hand while seated at the mat. Pt. Required assist to position the LUE into position for weightbearing. Pt. also worked on alternating weightbearing with functional reaching, and grasping.  Weightbearing, and proprioceptive input was performed in preparation for ROM, and facilitation of active movement.  Therapeutic Exercise:  Pt. worked on left shoulder stabilization exercises in supine with the shoulder flexed to 90 degrees, and the elbow in extension. Pt. was able to initiate active shoulder stabilization responses minimally with support provided proximally. Assist was also provided at the elbow to assist in maintaining elbow in extension. Pt. was able to perform scapular protraction with support provided at the elbow, and forearm. Pt. worked on hand to face patterns with support at the elbow, and hand. Pt. was able to perform elbow extension exercise against gravity. Pt. worked on wrist, and digit extension with facilitation, and alternating weightbearing. Pt. worked on active supination with support at the forearm.  Manual Therapy:  Pt.is making progress. Pt. reports that she was able to move her left thumb actively when holding her  husband's hand over the weekend. Pt. was able to achieve left shoulder for 110 degrees of painfree range in supine. Pt. is achieving more consistent active elbo flexion, extension, as well as active wrist, and digit extension responses with facilitation, and alternating weightbearing. Pt. compensates proximally, and with the trunk when attempting active movement. Pt. continues to work on facilitating active volitional movement in order to improve engagement in overall ADLs, and IADL tasks.                     OT Education - 10/20/19 1522    Education Details  Left UE  functioning    Person(s) Educated  Patient;Spouse    Methods  Demonstration    Comprehension  Returned demonstration;Verbalized understanding          OT Long Term Goals - 09/08/19 1402      OT LONG TERM GOAL #1   Title  Pt.  will increase left shoulder flexion PROM by 20 degrees to assist with UE dressing    Baseline  Eval: Shoulder flexion PROM 80 degrees    Time  12    Period  Weeks    Status  New    Target Date  12/01/19      OT LONG TERM GOAL #2   Title  Pt. will be able to actively stabilize left shoulder for 10 seconds in supine with shoulde flexed to 90 degrees, and elbow in extension in preparation for functional reaching.    Baseline  Eval: Pt.is unable to stabilize left shoulder    Time  12    Period  Weeks    Status  New    Target Date  12/01/19      OT LONG TERM GOAL #3   Title  Pt. will independently perform hand to face patterns with the LUE in preparation for light self grooming tasks.    Baseline  Eval: Pt. is unable to perform hand to face patterns with the LUE.    Time  12    Period  Weeks    Status  New    Target Date  12/01/19      OT LONG TERM GOAL #4   Title  Pt. will initiate 10 degrees of active left wrist extension  in preparation for actively lifting her hand off of a surface.    Baseline  Eval: Pt. is unable    Time  12    Period  Weeks    Status  New    Target  Date  12/01/19      OT LONG TERM GOAL #5   Title  Pt. will initiate left hand digit extension in order to be able to actively release an objects during ADL tasks.    Baseline  Eval: Pt. is unable    Time  12    Period  Weeks    Status  New    Target Date  12/01/19      Long Term Additional Goals   Additional Long Term Goals  Yes      OT LONG TERM GOAL #6   Title  Pt. increase left hand digit flexion in order to be able to hold her toothbrush while applying toothpaste with her left right hand.    Baseline  Eval: Pt. is unable    Time  12    Period  Weeks    Status  New    Target Date  12/01/19      OT LONG TERM GOAL #7   Title  --            Plan - 10/20/19 1523    Clinical Impression Statement  Pt.is making progress. Pt. reports that she was able to move her left thumb actively when holding her husband's hand over the weekend. Pt. was able to achieve left shoulder for 110 degrees of painfree range in supine. Pt. is achieving more consistent active elbo flexion, extension, as well as active wrist, and digit extension responses with facilitation,and alternating weightbearing. Pt. compensates proximally, and with the trunk when attempting active movement. Pt.continues to work on facilitating active volitional movement in order to improve engagement in overall ADLs, and IADL tasks.    OT Occupational Profile and History  Detailed Assessment- Review of Records and additional review of physical, cognitive, psychosocial history related to current functional performance    Occupational performance deficits (Please refer to evaluation for details):  ADL's;IADL's  Rehab Potential  Good    Clinical Decision Making  Several treatment options, min-mod task modification necessary    Comorbidities Affecting Occupational Performance:  May have comorbidities impacting occupational performance    Modification or Assistance to Complete Evaluation   Min-Moderate modification of tasks or assist  with assess necessary to complete eval    OT Frequency  2x / week    OT Duration  12 weeks    OT Treatment/Interventions  Self-care/ADL training;Moist Heat;DME and/or AE instruction;Neuromuscular education;Therapeutic exercise;Therapeutic activities;Energy conservation;Patient/family education    Consulted and Agree with Plan of Care  Patient       Patient will benefit from skilled therapeutic intervention in order to improve the following deficits and impairments:           Visit Diagnosis: Muscle weakness (generalized)    Problem List Patient Active Problem List   Diagnosis Date Noted  . E. coli UTI   . Diabetes mellitus type 2 in obese ([place])   . Chronic pain of right knee   . 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 Hemiparesis affecting left side as late effect of stroke ([place])   . Acute ischemic right middle cerebral artery (MCA) stroke ([place]) 07/22/2019  . Carotid artery dissection  (HCC) s/p stent placement 07/21/2019  . Diabetes mellitus type II, uncontrolled ([place]) 07/21/2019  . Acute blood loss anemia 07/21/2019  . Leukocytosis 07/21/2019  . Stroke (cerebrum) ([place]) - R MCA infarct s/p tenecteplase and mechanical thrombectomy w/ d/t ICA dissection  07/15/2019  . Middle cerebral artery embolism, right 07/15/2019  . Controlled type 2 diabetes mellitus without complication, without long-term current use of insulin ([place]) 05/28/2017  . Attention deficit hyperactivity disorder (ADHD), combined type 04/18/2017  . Morbid obesity ([place]) 01/25/2016  . Umbilical hernia [id number]  . Hiatal hernia 12/08/2015  . History of Clostridium difficile colitis 06/19/2014  . HTN (hypertension) 12/23/2013  . PCOS (polycystic ovarian syndrome) 12/23/2013  . Bariatric surgery status 01/28/2013    Harrel Carina, MS, OTR/L 10/20/2019, 10:39 PM  [place] MAIN [facility] SERVICES [address] [place], [place], [address] Phone: [phone]   Fax:   [phone]  Name: Kathryn Coffey MRN: [id number] Date of Birth: 18-Jun-1971

## 2019-10-20 NOTE — Therapy (Signed)
[place] MAIN [facility] SERVICES [address] [place], [place], [address] Phone: [phone]   Fax:  [phone]  Physical Therapy Treatment  Patient Details  Name: Kathryn Coffey MRN: [id number] Date of Birth: 1971-03-08 No data recorded  Encounter Date: 10/20/2019  PT End of Session - 10/20/19 1619    Visit Number  8    Number of Visits  36    Date for PT Re-Evaluation  11/03/19    PT Start Time  [id number]    PT Stop Time  1559    PT Time Calculation (min)  44 min    Equipment Utilized During Treatment  Gait belt    Activity Tolerance  Patient tolerated treatment well    Behavior During Therapy  WFL for tasks assessed/performed       Past Medical History:  Diagnosis Date  . Abdominal wall cellulitis 2013 and 2014  . Asthma    allergic to grasses  . B12 deficiency   . Complication of anesthesia   . Costochondritis   . Diabetes mellitus without complication ([place])   . Gastric anomaly    multiple small ulcers  . GERD (gastroesophageal reflux disease)   . Herpes 06/2018   POSSIBLY IN EYE- PT TAKING VALTREX AND WILL SEE OPTHAMOLOGIST TO SEE IF VALTREX IS WORKING  . History of abnormal cervical Pap smear   . History of Clostridium difficile colitis   . History of hiatal hernia   . History of kidney stones   . Hypertension   . IBS (irritable bowel syndrome)   . Migraine   . Morbid obesity ([place])    s/p attempted gastric banding now decompressed  . PCOS (polycystic ovarian syndrome)   . PONV (postoperative nausea and vomiting)    WITH SPINAL ONLY  . Stroke [facility])     Past Surgical History:  Procedure Laterality Date  . BREAST EXCISIONAL BIOPSY Left    cyst excision - Dr. Patty Sermons  . CESAREAN SECTION    . CHOLECYSTECTOMY    . COLONOSCOPY    . EAR CYST EXCISION Right 07/04/2018   Procedure: EXCISION GLOMUS TUMOR THUMBNAIL;  Surgeon: Corky Mull, MD;  Location: [facility] ORS;  Service: Orthopedics;  Laterality: Right;  .  ESOPHAGOGASTRODUODENOSCOPY    . ESOPHAGOGASTRODUODENOSCOPY (EGD) WITH PROPOFOL N/A 12/06/2015   Procedure: ESOPHAGOGASTRODUODENOSCOPY (EGD) WITH PROPOFOL;  Surgeon: Manya Silvas, MD;  Location: [facility] ENDOSCOPY;  Service: Endoscopy;  Laterality: N/A;  . IR ANGIO INTRA EXTRACRAN SEL INTERNAL CAROTID UNI L MOD SED  07/15/2019  . IR ANGIO VERTEBRAL SEL SUBCLAVIAN INNOMINATE UNI R MOD SED  07/15/2019  . IR CT HEAD LTD  07/15/2019  . IR INTRAVSC STENT CERV CAROTID W/O EMB-PROT MOD SED INC ANGIO  07/15/2019  . IR PERCUTANEOUS ART THROMBECTOMY/INFUSION INTRACRANIAL INC DIAG ANGIO  07/15/2019  . LAPAROSCOPIC GASTRIC BANDING    . LAPAROSCOPIC GASTRIC RESTRICTIVE DUODENAL PROCEDURE (DUODENAL SWITCH) N/A 01/25/2016   Procedure: LAPAROSCOPIC GASTRIC RESTRICTIVE DUODENAL PROCEDURE (DUODENAL SWITCH);  Surgeon: Ladora Daniel, MD;  Location: [facility] ORS;  Service: General;  Laterality: N/A;  . OVARY SURGERY     x2  . RADIOLOGY WITH ANESTHESIA N/A 07/15/2019   Procedure: IR WITH ANESTHESIA;  Surgeon: Luanne Bras, MD;  Location: [place];  Service: Radiology;  Laterality: N/A;  . TONSILLECTOMY    . TUBAL LIGATION    . UMBILICAL HERNIA REPAIR N/A 01/25/2016   Procedure: LAPAROSCOPIC UMBILICAL HERNIA;  Surgeon: Ladora Daniel, MD;  Location: [facility] ORS;  Service: General;  Laterality: N/A;  . UMBILICAL HERNIA REPAIR N/A 10/20/2016   Procedure: HERNIA REPAIR UMBILICAL ADULT;  Surgeon: Leonie Green, MD;  Location: [facility] ORS;  Service: General;  Laterality: N/A;    There were no vitals filed for this visit.  Subjective Assessment - 10/20/19 1618    Subjective  Patient reports compliance with HEP, is meeting with doctor on friday. Is challenged with keeping her left leg straight. No falls or LOB since last session. Compliant with HEP    Pertinent History  Patient was in the hospital [facility] and then in patient rehab 3 weeks. She was dicharged 08/13/19 and then HHPT and was discharged 09/03/19. She has not  had any falls.    Currently in Pain?  No/denies         In // bars: CGA for stability and safety awareness for decreased fall risk. Cueing for task orientation and body mechanics.    One foot on ground one foot on 6" step, static hold 30 seconds x 2 trials each LE; LLE hyperextension for stability, task transitioned to SUE support while performing left knee flexion extension alternations 60 seconds.    2.5 ankle weight: marches into band at hip height x12 each LE  2.5 ankle weight, lateral steps with SUE support, focus on weight shift and foot clearance bilaterally     Seated: cues for body mechanics and neutral alignment   RTB around ankles: LAQ alternating LE;s challenging to stabilize with LLE 12x each LE RTB around ankles; rainbow ball between knees: IR/ER 12x RTB around knees: abduction single LE at a time alternating pattern 10x each LE RTB around knees: abduct with pulse 3 second at end range 10x, challenging for fine motor coordination of LLE YTB 4 way ankle: df/pf/in/ev 12x each direction LLE 2.5 ankle weight LAQ LLE 12x focus on slow eccentric/concentric control  Straight leg abduction/adduction between PT's hands 10x  Bosu ball press downs 15x each LE    Supine: cues for body mechanics and neutral alignment Bridge: stabilization to LLE provided 10x, cues for posterior pelvic tilt Single LE bridge: RLE straight, stabilization to LLE 10x; very challenging for patient Straight leg abduction 12x LLE, min A for keeping foot neutral  Use of ice pack to cool body temperature down between sets of interventions performed.     Pt educated throughout session about proper posture and technique with exercises. Improved exercise technique, movement at target joints, use of target muscles after min to mod verbal, visual, tactile cues.                         PT Education - 10/20/19 1619    Education Details  exercise technique, body mechanics, stabilization     Person(s) Educated  Patient    Methods  Explanation;Demonstration;Tactile cues;Verbal cues    Comprehension  Verbalized understanding;Returned demonstration;Verbal cues required;Tactile cues required       PT Short Term Goals - 09/08/19 1218      PT SHORT TERM GOAL #1   Title  Patient will be independent in home exercise program to improve strength/mobility for better functional independence with ADLs.    Time  6    Period  Weeks    Status  New    Target Date  10/20/19      PT SHORT TERM GOAL #2   Title  Patient (> 11 years old) will complete five times sit to stand test in < 15 seconds  indicating an increased LE strength and improved balance.    Time  6    Period  Weeks    Status  New    Target Date  10/20/19        PT Long Term Goals - 09/08/19 1222      PT LONG TERM GOAL #1   Title  Patient will increase Berg Balance score by > 6 points to demonstrate decreased fall risk during functional activities.    Time  12    Period  Weeks    Status  New    Target Date  12/01/19      PT LONG TERM GOAL #2   Title  Patient will increase six minute walk test distance to >1200 for progression to community ambulator and improve gait ability    Time  12    Period  Weeks    Status  New    Target Date  12/01/19      PT LONG TERM GOAL #3   Title  Patient will increase 10 meter walk test to >1.37m/s as to improve gait speed for better community ambulation and to reduce fall risk    Time  12    Period  Weeks    Status  New    Target Date  12/01/19            Plan - 10/20/19 1620    Clinical Impression Statement  Patient presents to physical therapy with excellent motivation. Stabilization of L knee and ankle limited by fatigue, patient hyperextends when fatigues to stabilize self. Control of neutral alignment of LLE is very challenging with patient requiring frequent visual/tactile cueing for alignment. Pt will benefit from PT services to address deficits in strength, balance, and  mobility in order to return to full function at home.    Personal Factors and Comorbidities  Comorbidity 1    Comorbidities  diabetes, high blood pressure, reflux,    Examination-Activity Limitations  Carry;Caring for Others;Bend;Locomotion Level;Self Feeding;Sleep;Toileting    Examination-Participation Restrictions  Cleaning;Community Activity;Driving;Laundry;Shop    Stability/Clinical Decision Making  Stable/Uncomplicated    Rehab Potential  Good    PT Frequency  2x / week    PT Duration  12 weeks    PT Treatment/Interventions  Manual techniques;Functional mobility training;Stair training;Gait training;Electrical Stimulation;Moist Heat;Ultrasound;Therapeutic exercise;Balance training;Neuromuscular re-education    PT Next Visit Plan  strengthening and balance    PT Home Exercise Plan  standing hip abd,    Consulted and Agree with Plan of Care  Patient       Patient will benefit from skilled therapeutic intervention in order to improve the following deficits and impairments:  Abnormal gait, Decreased knowledge of use of DME, Dizziness, Pain, Decreased coordination, Impaired UE functional use, Decreased strength, Decreased endurance, Decreased activity tolerance, Decreased balance, Difficulty walking, Obesity  Visit Diagnosis: Muscle weakness (generalized)  Other lack of coordination  Difficulty in walking, not elsewhere classified  Other abnormalities of gait and mobility     Problem List Patient Active Problem List   Diagnosis Date Noted  . E. coli UTI   . Diabetes mellitus type 2 in obese ([place])   . Chronic pain of right knee   . 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 Hemiparesis affecting left side as late effect of stroke ([place])   . Acute ischemic right middle cerebral artery (MCA) stroke ([place]) 07/22/2019  . Carotid artery dissection  (HCC) s/p stent placement 07/21/2019  . Diabetes mellitus type II, uncontrolled (  [place]) 07/21/2019  . Acute blood loss anemia  07/21/2019  . Leukocytosis 07/21/2019  . Stroke (cerebrum) ([place]) - R MCA infarct s/p tenecteplase and mechanical thrombectomy w/ d/t ICA dissection  07/15/2019  . Middle cerebral artery embolism, right 07/15/2019  . Controlled type 2 diabetes mellitus without complication, without long-term current use of insulin ([place]) 05/28/2017  . Attention deficit hyperactivity disorder (ADHD), combined type 04/18/2017  . Morbid obesity ([place]) 01/25/2016  . Umbilical hernia [id number]  . Hiatal hernia 12/08/2015  . History of Clostridium difficile colitis 06/19/2014  . HTN (hypertension) 12/23/2013  . PCOS (polycystic ovarian syndrome) 12/23/2013  . Bariatric surgery status 01/28/2013   Janna Arch, PT, DPT   10/20/2019, 4:21 PM  [place] MAIN [facility] SERVICES [address] [place], [place], [address] Phone: [phone]   Fax:  [phone]  Name: Kathryn Coffey MRN: [id number] Date of Birth: 1971/01/12

## 2019-10-22 ENCOUNTER — Encounter: Payer: Self-pay | Admitting: Occupational Therapy

## 2019-10-22 ENCOUNTER — Ambulatory Visit: Payer: No Typology Code available for payment source | Admitting: Occupational Therapy

## 2019-10-22 ENCOUNTER — Other Ambulatory Visit: Payer: Self-pay

## 2019-10-22 DIAGNOSIS — M6281 Muscle weakness (generalized): Secondary | ICD-10-CM | POA: Diagnosis not present

## 2019-10-23 NOTE — Therapy (Addendum)
[place] MAIN [facility] SERVICES [address] [place], [place], [address] Phone: [phone]   Fax:  [phone]  Occupational Therapy Treatment  Patient Details  Name: Kathryn Coffey MRN: [id number] Date of Birth: [birth year] Referring Provider (OT): Raulkar   Encounter Date: 10/22/2019  OT End of Session - 10/22/19 1641    Visit Number  8    Number of Visits  24    Date for OT Re-Evaluation  12/01/19    Authorization Type  Progress report period starting  on 09/08/2019    OT Start Time  1433    OT Stop Time  1515    OT Time Calculation (min)  42 min    Activity Tolerance  Patient tolerated treatment well    Behavior During Therapy  [facility] for tasks assessed/performed       Past Medical History:  Diagnosis Date  . Abdominal wall cellulitis 2013 and 2014  . Asthma    allergic to grasses  . B12 deficiency   . Complication of anesthesia   . Costochondritis   . Diabetes mellitus without complication ([place])   . Gastric anomaly    multiple small ulcers  . GERD (gastroesophageal reflux disease)   . Herpes 06/2018   POSSIBLY IN EYE- PT TAKING VALTREX AND WILL SEE OPTHAMOLOGIST TO SEE IF VALTREX IS WORKING  . History of abnormal cervical Pap smear   . History of Clostridium difficile colitis   . History of hiatal hernia   . History of kidney stones   . Hypertension   . IBS (irritable bowel syndrome)   . Migraine   . Morbid obesity ([place])    s/p attempted gastric banding now decompressed  . PCOS (polycystic ovarian syndrome)   . PONV (postoperative nausea and vomiting)    WITH SPINAL ONLY  . Stroke [facility])     Past Surgical History:  Procedure Laterality Date  . BREAST EXCISIONAL BIOPSY Left    cyst excision - Dr. Patty Sermons  . CESAREAN SECTION    . CHOLECYSTECTOMY    . COLONOSCOPY    . EAR CYST EXCISION Right 07/04/2018   Procedure: EXCISION GLOMUS TUMOR THUMBNAIL;  Surgeon: Corky Mull, MD;  Location: [facility] ORS;  Service:  Orthopedics;  Laterality: Right;  . ESOPHAGOGASTRODUODENOSCOPY    . ESOPHAGOGASTRODUODENOSCOPY (EGD) WITH PROPOFOL N/A 12/06/2015   Procedure: ESOPHAGOGASTRODUODENOSCOPY (EGD) WITH PROPOFOL;  Surgeon: Manya Silvas, MD;  Location: [facility] ENDOSCOPY;  Service: Endoscopy;  Laterality: N/A;  . IR ANGIO INTRA EXTRACRAN SEL INTERNAL CAROTID UNI L MOD SED  07/15/2019  . IR ANGIO VERTEBRAL SEL SUBCLAVIAN INNOMINATE UNI R MOD SED  07/15/2019  . IR CT HEAD LTD  07/15/2019  . IR INTRAVSC STENT CERV CAROTID W/O EMB-PROT MOD SED INC ANGIO  07/15/2019  . IR PERCUTANEOUS ART THROMBECTOMY/INFUSION INTRACRANIAL INC DIAG ANGIO  07/15/2019  . LAPAROSCOPIC GASTRIC BANDING    . LAPAROSCOPIC GASTRIC RESTRICTIVE DUODENAL PROCEDURE (DUODENAL SWITCH) N/A 01/25/2016   Procedure: LAPAROSCOPIC GASTRIC RESTRICTIVE DUODENAL PROCEDURE (DUODENAL SWITCH);  Surgeon: Ladora Daniel, MD;  Location: [facility] ORS;  Service: General;  Laterality: N/A;  . OVARY SURGERY     x2  . RADIOLOGY WITH ANESTHESIA N/A 07/15/2019   Procedure: IR WITH ANESTHESIA;  Surgeon: Luanne Bras, MD;  Location: [place];  Service: Radiology;  Laterality: N/A;  . TONSILLECTOMY    . TUBAL LIGATION    . UMBILICAL HERNIA REPAIR N/A 01/25/2016   Procedure: LAPAROSCOPIC UMBILICAL HERNIA;  Surgeon: Ladora Daniel,  MD;  Location: [facility] ORS;  Service: General;  Laterality: N/A;  . UMBILICAL HERNIA REPAIR N/A 10/20/2016   Procedure: HERNIA REPAIR UMBILICAL ADULT;  Surgeon: Leonie Green, MD;  Location: [facility] ORS;  Service: General;  Laterality: N/A;    There were no vitals filed for this visit.  Subjective Assessment - 10/22/19 1640    Subjective   Pt. reports doing well today.    Patient is accompanied by:  Family member    Pertinent History  Per pt. chart. Pt. is a 49 y.o. female with history of HTN, T2DM, obesity who was admitted on 07/15/19 with onset of HA progressing to flaccid left hemiparesis a few hours later.  CT head showed acute nonhemorrhagic  infarct in right basal ganglia with hyperdense right MCA sign.  CT A/P head neck showed perfusion deficit with occluded right ICA and slow flow in M1.  She underwent cerebral angiogram with revascularization of right MCA with T1C1 revascularization and repair of right ICA with flow diverter device. Stroke felt to be embolic likely due to ICA dissection. Postprocedure head CT was negative for bleed repeat CTA head neck showed reocclusion of right ICA origin and occlusion of right ICA stent but now with patent right MCA. Pt. received inpatient rehab services for 3 weeks, and home health services.    Limitations  LUE AROM    Currently in Pain?  No/denies     OT Treatment  Neuro muscular re-education:  Pt. tolerated weightbearing, and proprioceptive awareness through the LUE, and hand. Pt. required assist to position the LUE into position for weightbearing. Pt. also worked on alternating weightbearing with functional reaching, and grasping.  Weightbearing, and proprioceptive input was performed in preparation for ROM, and facilitation of active movement. Pt. worked on initiating functional reaching with the LUE, and hand. Pt. required assist, ans support at the  Left elbow, and forearm. Pt. worked on grasping the the cones at her left side on the mat, and worked on reaching forward to place the cones on a tabletop, and abducting her thumb, and extending her digits with release the cone in an upright vertical position. Pt. was able to initiate active elbow extension, digit extension, and thumb abduction. Pt. worked on developing lateral grasp at the tabletop.  Therapeutic Exercise:  Pt. worked on left shoulder stabilization exercises in supine with the shoulder flexed to 90 degrees, and the elbow in extension. Pt. was unable to stabilize shoulder, and required support proximally. Pt. was able to perform scapular protraction with support provided at the elbow, and forearm. Pt. Worked on hand to face  patterns with support at the elbow, and wrist. Pt. Worked on wrist, and digit extension with alternating weightbearing. Pt. Worked on active supination with support at the forearm. Pt. Worked on functional reaching for a short dowel placed on a lower surface in a vertical position with support at the forearm. Pt. Was able to actively relax grip consistently.   Pt. is making steady progress. Pt. has improved with active left elbow flexion, and extension, forearm supination, pronation, active 2nd, 4th, and 5th digit MP extension from the tabletop surface with facilitation. Pt. presents with active thumb radial abduction, and adduction. Pt. is able to initiate grasping 1" foam cylinder with her thumb to the lateral aspect of her 2nd digit. Pt. continues to work on improving LUE functioning in order to work towards increasing LUE egagement during ADLs, and IADL tasks.  OT Education - 10/22/19 1641    Education Details  Left UE  functioning    Person(s) Educated  Patient;Spouse    Methods  Demonstration    Comprehension  Returned demonstration;Verbalized understanding          OT Long Term Goals - 09/08/19 1402      OT LONG TERM GOAL #1   Title  Pt. will increase left shoulder flexion PROM by 20 degrees to assist with UE dressing    Baseline  Eval: Shoulder flexion PROM 80 degrees    Time  12    Period  Weeks    Status  New    Target Date  12/01/19      OT LONG TERM GOAL #2   Title  Pt. will be able to actively stabilize left shoulder for 10 seconds in supine with shoulde flexed to 90 degrees, and elbow in extension in preparation for functional reaching.    Baseline  Eval: Pt.is unable to stabilize left shoulder    Time  12    Period  Weeks    Status  New    Target Date  12/01/19      OT LONG TERM GOAL #3   Title  Pt. will independently perform hand to face patterns with the LUE in preparation for light self grooming tasks.    Baseline  Eval: Pt.  is unable to perform hand to face patterns with the LUE.    Time  12    Period  Weeks    Status  New    Target Date  12/01/19      OT LONG TERM GOAL #4   Title  Pt. will initiate 10 degrees of active left wrist extension  in preparation for actively lifting her hand off of a surface.    Baseline  Eval: Pt. is unable    Time  12    Period  Weeks    Status  New    Target Date  12/01/19      OT LONG TERM GOAL #5   Title  Pt. will initiate left hand digit extension in order to be able to actively release an objects during ADL tasks.    Baseline  Eval: Pt. is unable    Time  12    Period  Weeks    Status  New    Target Date  12/01/19      Long Term Additional Goals   Additional Long Term Goals  Yes      OT LONG TERM GOAL #6   Title  Pt. increase left hand digit flexion in order to be able to hold her toothbrush while applying toothpaste with her left right hand.    Baseline  Eval: Pt. is unable    Time  12    Period  Weeks    Status  New    Target Date  12/01/19      OT LONG TERM GOAL #7   Title  --            Plan - 10/22/19 1642    Clinical Impression Statement  Pt. is making steady progress. Pt. has improved with active left elbow flexion, and extension, forearm supination, pronation, active 2nd, 4th, and 5th digit MP extension from the tabletop surface with facilitation. Pt. presents with active thumb radial abduction, and adduction. Pt. is able to initiate grasping 1" foam cylinder with her thumb to the lateral aspect of her 2nd digit. Pt. continues to  work on improving LUE functioning in order to work towards increasing LUE egagement during ADLs, and IADL tasks.    OT Occupational Profile and History  Detailed Assessment- Review of Records and additional review of physical, cognitive, psychosocial history related to current functional performance    Occupational performance deficits (Please refer to evaluation for details):  ADL's;IADL's    Rehab Potential  Good     Clinical Decision Making  Several treatment options, min-mod task modification necessary    Comorbidities Affecting Occupational Performance:  May have comorbidities impacting occupational performance    Modification or Assistance to Complete Evaluation   Min-Moderate modification of tasks or assist with assess necessary to complete eval    OT Frequency  2x / week    OT Duration  12 weeks    OT Treatment/Interventions  Self-care/ADL training;Moist Heat;DME and/or AE instruction;Neuromuscular education;Therapeutic exercise;Therapeutic activities;Energy conservation;Patient/family education    Consulted and Agree with Plan of Care  Patient       Patient will benefit from skilled therapeutic intervention in order to improve the following deficits and impairments:           Visit Diagnosis: Muscle weakness (generalized)    Problem List Patient Active Problem List   Diagnosis Date Noted  . E. coli UTI   . Diabetes mellitus type 2 in obese ([place])   . Chronic pain of right knee   . 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 Hemiparesis affecting left side as late effect of stroke ([place])   . Acute ischemic right middle cerebral artery (MCA) stroke ([place]) 07/22/2019  . Carotid artery dissection  (HCC) s/p stent placement 07/21/2019  . Diabetes mellitus type II, uncontrolled ([place]) 07/21/2019  . Acute blood loss anemia 07/21/2019  . Leukocytosis 07/21/2019  . Stroke (cerebrum) ([place]) - R MCA infarct s/p tenecteplase and mechanical thrombectomy w/ d/t ICA dissection  07/15/2019  . Middle cerebral artery embolism, right 07/15/2019  . Controlled type 2 diabetes mellitus without complication, without long-term current use of insulin ([place]) 05/28/2017  . Attention deficit hyperactivity disorder (ADHD), combined type 04/18/2017  . Morbid obesity ([place]) 01/25/2016  . Umbilical hernia [id number]  . Hiatal hernia 12/08/2015  . History of Clostridium difficile colitis 06/19/2014  . HTN  (hypertension) 12/23/2013  . PCOS (polycystic ovarian syndrome) 12/23/2013  . Bariatric surgery status 01/28/2013    Harrel Carina, MS, OTR/L 10/23/2019, 9:08 AM  [place] MAIN [facility] SERVICES [address] [place], [place], [address] Phone: [phone]   Fax:  [phone]  Name: Kathryn Coffey MRN: [id number] Date of Birth: 10/11/1970

## 2019-10-24 ENCOUNTER — Other Ambulatory Visit: Payer: Self-pay

## 2019-10-24 ENCOUNTER — Encounter: Payer: Self-pay | Admitting: Physical Medicine and Rehabilitation

## 2019-10-24 ENCOUNTER — Encounter
Payer: No Typology Code available for payment source | Attending: Physical Medicine and Rehabilitation | Admitting: Physical Medicine and Rehabilitation

## 2019-10-24 VITALS — BP 132/94 | HR 88 | Temp 97.7°F | Ht 65.0 in | Wt 232.0 lb

## 2019-10-24 DIAGNOSIS — I10 Essential (primary) hypertension: Secondary | ICD-10-CM | POA: Insufficient documentation

## 2019-10-24 DIAGNOSIS — M7522 Bicipital tendinitis, left shoulder: Secondary | ICD-10-CM | POA: Diagnosis not present

## 2019-10-24 DIAGNOSIS — I6601 Occlusion and stenosis of right middle cerebral artery: Secondary | ICD-10-CM | POA: Diagnosis not present

## 2019-10-24 DIAGNOSIS — R519 Headache, unspecified: Secondary | ICD-10-CM | POA: Insufficient documentation

## 2019-10-24 DIAGNOSIS — R29898 Other symptoms and signs involving the musculoskeletal system: Secondary | ICD-10-CM

## 2019-10-24 DIAGNOSIS — S46119A Strain of muscle, fascia and tendon of long head of biceps, unspecified arm, initial encounter: Secondary | ICD-10-CM | POA: Insufficient documentation

## 2019-10-24 DIAGNOSIS — R232 Flushing: Secondary | ICD-10-CM | POA: Insufficient documentation

## 2019-10-24 DIAGNOSIS — Q741 Congenital malformation of knee: Secondary | ICD-10-CM

## 2019-10-24 DIAGNOSIS — M67912 Unspecified disorder of synovium and tendon, left shoulder: Secondary | ICD-10-CM

## 2019-10-24 NOTE — Progress Notes (Signed)
Subjective:    Patient ID: Kathryn Coffey, female    DOB: 10/22/1970, 49 y.o.   MRN: [id number]  HPI  Kathryn Coffey presents for follow-up of her left arm flaccidity and biceps tendonitis following R MCA stroke.   She has been continuing with physical therapy and has seen improvements with her left arm strength! Continues to have flaccidity of left wrist and does not have a brace at home.   Her orthopedist mentioned that she has patellar dysplasia and that she would benefit from a knee brace.   She continues to have a bulge in her left arm and has been using Tizanidine 2mg  as needed for her pain. It still makes her sleepy but she is able to tolerate the dose better. I have sent refill recently.   She asks whether she should be on a statin. She is still not sure why she had a stroke. She wanted to participate in a trial with a robot at [facility] but unfortunately did not have enough left arm strength to qualify for the trial.    Pain Inventory Average Pain 3 Pain Right Now 3 My pain is dull and aching  In the last 24 hours, has pain interfered with the following? General activity 2 Relation with others 2 Enjoyment of life 3 What TIME of day is your pain at its worst? evening Sleep (in general) Fair  Pain is worse with: some activites Pain improves with: rest, heat/ice and medication Relief from Meds: 9  Mobility walk with assistance use a cane ability to climb steps?  yes do you drive?  no  Function employed # of hrs/week . I need assistance with the following:  dressing, bathing, meal prep, household duties and shopping  Neuro/Psych weakness trouble walking spasms loss of taste or smell  Prior Studies Any changes since last visit?  no  Physicians involved in your care Any changes since last visit?  no   Family History  Problem Relation Age of Onset  . Breast cancer Maternal Grandmother 33  . Diabetes Mother   . Diabetes Father   . Heart attack Father    Social  History   Socioeconomic History  . Marital status: Married    Spouse name: Not on file  . Number of children: Not on file  . Years of education: Not on file  . Highest education level: Not on file  Occupational History  . Not on file  Tobacco Use  . Smoking status: Never Smoker  . Smokeless tobacco: Never Used  Substance and Sexual Activity  . Alcohol use: Yes    Alcohol/week: 0.0 standard drinks    Comment: SOCIAL  . Drug use: No  . Sexual activity: Yes    Birth control/protection: I.U.D., Other-see comments    Comment: BTL  Other Topics Concern  . Not on file  Social History Narrative  . Not on file   Social Determinants of Health   Financial Resource Strain:   . Difficulty of Paying Living Expenses: Not on file  Food Insecurity:   . Worried About Charity fundraiser in the Last Year: Not on file  . Ran Out of Food in the Last Year: Not on file  Transportation Needs:   . Lack of Transportation (Medical): Not on file  . Lack of Transportation (Non-Medical): Not on file  Physical Activity:   . Days of Exercise per Week: Not on file  . Minutes of Exercise per Session: Not on file  Stress:   .  Feeling of Stress : Not on file  Social Connections:   . Frequency of Communication with Friends and Family: Not on file  . Frequency of Social Gatherings with Friends and Family: Not on file  . Attends Religious Services: Not on file  . Active Member of Clubs or Organizations: Not on file  . Attends Archivist Meetings: Not on file  . Marital Status: Not on file   Past Surgical History:  Procedure Laterality Date  . BREAST EXCISIONAL BIOPSY Left    cyst excision - Dr. Patty Sermons  . CESAREAN SECTION    . CHOLECYSTECTOMY    . COLONOSCOPY    . EAR CYST EXCISION Right 07/04/2018   Procedure: EXCISION GLOMUS TUMOR THUMBNAIL;  Surgeon: Corky Mull, MD;  Location: [facility] ORS;  Service: Orthopedics;  Laterality: Right;  . ESOPHAGOGASTRODUODENOSCOPY    .  ESOPHAGOGASTRODUODENOSCOPY (EGD) WITH PROPOFOL N/A 12/06/2015   Procedure: ESOPHAGOGASTRODUODENOSCOPY (EGD) WITH PROPOFOL;  Surgeon: Manya Silvas, MD;  Location: [facility] ENDOSCOPY;  Service: Endoscopy;  Laterality: N/A;  . IR ANGIO INTRA EXTRACRAN SEL INTERNAL CAROTID UNI L MOD SED  07/15/2019  . IR ANGIO VERTEBRAL SEL SUBCLAVIAN INNOMINATE UNI R MOD SED  07/15/2019  . IR CT HEAD LTD  07/15/2019  . IR INTRAVSC STENT CERV CAROTID W/O EMB-PROT MOD SED INC ANGIO  07/15/2019  . IR PERCUTANEOUS ART THROMBECTOMY/INFUSION INTRACRANIAL INC DIAG ANGIO  07/15/2019  . LAPAROSCOPIC GASTRIC BANDING    . LAPAROSCOPIC GASTRIC RESTRICTIVE DUODENAL PROCEDURE (DUODENAL SWITCH) N/A 01/25/2016   Procedure: LAPAROSCOPIC GASTRIC RESTRICTIVE DUODENAL PROCEDURE (DUODENAL SWITCH);  Surgeon: Ladora Daniel, MD;  Location: [facility] ORS;  Service: General;  Laterality: N/A;  . OVARY SURGERY     x2  . RADIOLOGY WITH ANESTHESIA N/A 07/15/2019   Procedure: IR WITH ANESTHESIA;  Surgeon: Luanne Bras, MD;  Location: [place];  Service: Radiology;  Laterality: N/A;  . TONSILLECTOMY    . TUBAL LIGATION    . UMBILICAL HERNIA REPAIR N/A 01/25/2016   Procedure: LAPAROSCOPIC UMBILICAL HERNIA;  Surgeon: Ladora Daniel, MD;  Location: [facility] ORS;  Service: General;  Laterality: N/A;  . UMBILICAL HERNIA REPAIR N/A 10/20/2016   Procedure: HERNIA REPAIR UMBILICAL ADULT;  Surgeon: Leonie Green, MD;  Location: [facility] ORS;  Service: General;  Laterality: N/A;   Past Medical History:  Diagnosis Date  . Abdominal wall cellulitis 2013 and 2014  . Asthma    allergic to grasses  . B12 deficiency   . Complication of anesthesia   . Costochondritis   . Diabetes mellitus without complication ([place])   . Gastric anomaly    multiple small ulcers  . GERD (gastroesophageal reflux disease)   . Herpes 06/2018   POSSIBLY IN EYE- PT TAKING VALTREX AND WILL SEE OPTHAMOLOGIST TO SEE IF VALTREX IS WORKING  . History of abnormal cervical Pap smear   .  History of Clostridium difficile colitis   . History of hiatal hernia   . History of kidney stones   . Hypertension   . IBS (irritable bowel syndrome)   . Migraine   . Morbid obesity ([place])    s/p attempted gastric banding now decompressed  . PCOS (polycystic ovarian syndrome)   . PONV (postoperative nausea and vomiting)    WITH SPINAL ONLY  . Stroke [facility])    There were no vitals taken for this visit.  Opioid Risk Score:   Fall Risk Score:  `1  Depression screen PHQ 2/9  Depression screen [facility] 2/9 06/06/2017 10/11/2015  Decreased Interest 1 0  Down, Depressed, Hopeless 0 0  PHQ - 2 Score 1 0     Review of Systems  Constitutional: Negative.   HENT: Negative.   Eyes: Negative.   Respiratory: Negative.   Cardiovascular: Negative.   Gastrointestinal: Positive for constipation and nausea.  Endocrine: Negative.   Genitourinary: Negative.   Musculoskeletal: Positive for gait problem and myalgias.  Skin: Negative.   Allergic/Immunologic: Negative.   Neurological: Positive for weakness.  Hematological: Negative.   Psychiatric/Behavioral: Negative.   All other systems reviewed and are negative.      Objective:   Physical Exam Constitutional: No distress . Vital signs reviewed.Sitting up in chair. HENT: Normocephalic. Atraumatic. Eyes: EOMI. No discharge. Cardiovascular: No JVD. Respiratory: Normal effort. No stridor. GI: Non-distended. Skin: Warm and dry. Intact. Psych: Normal mood. Normal behavior. Musc: No edema in extremities. No tenderness in extremities. Neuro: Alert Left facial droop Motor: RUE/RLE: 5/5 proximal distal LUE: 0/5 WF, 1/15 hand grip, 3/5 SA, 2/5 EF and EE. Bulge and tightness at proximal biceps location with point tenderness. LLE: 4--4/5 proximal to distal  No increase in tone appreciated    Assessment & Plan:  Kathryn Coffey presents for follow-up of bicipital tendonitis and L arm weakness following R MCA stroke.   1) Left biceps  tendonitis --Patient presented with exquisite LUE pain following corticosteroid injection, with bulge near proximal biceps tendon on exam, with point tenderness. Suspected proximal biceps tendon rupture, but MRI showed biceps tendonitis, personally reviewed and discussed with patient and husband.  2) R MCA stroke --Continue therapy for LUE strengthening, progressing very well! Current therapy will extend until the end of April -This month I provided refill for Tizanidine 2mg  for pain relief of muscle spasms.  -Prescribed L WHO to keep wrist in neutral position -Prescribed left knee brace given patella dysplasia.   All questions were encouraged and answered. Follow up with me PRN.

## 2019-10-27 ENCOUNTER — Ambulatory Visit: Payer: No Typology Code available for payment source

## 2019-10-27 ENCOUNTER — Encounter: Payer: Self-pay | Admitting: Occupational Therapy

## 2019-10-27 ENCOUNTER — Ambulatory Visit: Payer: No Typology Code available for payment source | Admitting: Occupational Therapy

## 2019-10-27 ENCOUNTER — Other Ambulatory Visit: Payer: Self-pay

## 2019-10-27 DIAGNOSIS — R262 Difficulty in walking, not elsewhere classified: Secondary | ICD-10-CM

## 2019-10-27 DIAGNOSIS — M6281 Muscle weakness (generalized): Secondary | ICD-10-CM

## 2019-10-27 NOTE — Therapy (Addendum)
[place] MAIN [facility] SERVICES [address] [place], [place], [address] Phone: [phone]   Fax:  [phone]  Occupational Therapy Treatment  Patient Details  Name: Kathryn Coffey MRN: [id number] Date of Birth: 05-22-1971 Referring Provider (OT): Raulkar   Encounter Date: 10/27/2019  OT End of Session - 10/27/19 2220    Visit Number  9    Number of Visits  24    Date for OT Re-Evaluation  12/01/19    Authorization Type  Progress report period starting  on 09/08/2019    Authorization Time Period  FOTO Every10th visit    OT Start Time  1434    OT Stop Time  1515    OT Time Calculation (min)  41 min    Activity Tolerance  Patient tolerated treatment well    Behavior During Therapy  [facility] for tasks assessed/performed       Past Medical History:  Diagnosis Date  . Abdominal wall cellulitis 2013 and 2014  . Asthma    allergic to grasses  . B12 deficiency   . Complication of anesthesia   . Costochondritis   . Diabetes mellitus without complication ([place])   . Gastric anomaly    multiple small ulcers  . GERD (gastroesophageal reflux disease)   . Herpes 06/2018   POSSIBLY IN EYE- PT TAKING VALTREX AND WILL SEE OPTHAMOLOGIST TO SEE IF VALTREX IS WORKING  . History of abnormal cervical Pap smear   . History of Clostridium difficile colitis   . History of hiatal hernia   . History of kidney stones   . Hypertension   . IBS (irritable bowel syndrome)   . Migraine   . Morbid obesity ([place])    s/p attempted gastric banding now decompressed  . PCOS (polycystic ovarian syndrome)   . PONV (postoperative nausea and vomiting)    WITH SPINAL ONLY  . Stroke [facility])     Past Surgical History:  Procedure Laterality Date  . BREAST EXCISIONAL BIOPSY Left    cyst excision - Dr. Patty Sermons  . CESAREAN SECTION    . CHOLECYSTECTOMY    . COLONOSCOPY    . EAR CYST EXCISION Right 07/04/2018   Procedure: EXCISION GLOMUS TUMOR THUMBNAIL;  Surgeon: Corky Mull, MD;  Location: [facility] ORS;  Service: Orthopedics;  Laterality: Right;  . ESOPHAGOGASTRODUODENOSCOPY    . ESOPHAGOGASTRODUODENOSCOPY (EGD) WITH PROPOFOL N/A 12/06/2015   Procedure: ESOPHAGOGASTRODUODENOSCOPY (EGD) WITH PROPOFOL;  Surgeon: Manya Silvas, MD;  Location[facility] ENDOSCOPY;  Service: Endoscopy;  Laterality: N/A;  . IR ANGIO INTRA EXTRACRAN SEL INTERNAL CAROTID UNI L MOD SED  07/15/2019  . IR ANGIO VERTEBRAL SEL SUBCLAVIAN INNOMINATE UNI R MOD SED  07/15/2019  . IR CT HEAD LTD  07/15/2019  . IR INTRAVSC STENT CERV CAROTID W/O EMB-PROT MOD SED INC ANGIO  07/15/2019  . IR PERCUTANEOUS ART THROMBECTOMY/INFUSION INTRACRANIAL INC DIAG ANGIO  07/15/2019  . LAPAROSCOPIC GASTRIC BANDING    . LAPAROSCOPIC GASTRIC RESTRICTIVE DUODENAL PROCEDURE (DUODENAL SWITCH) N/A 01/25/2016   Procedure: LAPAROSCOPIC GASTRIC RESTRICTIVE DUODENAL PROCEDURE (DUODENAL SWITCH);  Surgeon: Ladora Daniel, MD;  Location: [facility] ORS;  Service: General;  Laterality: N/A;  . OVARY SURGERY     x2  . RADIOLOGY WITH ANESTHESIA N/A 07/15/2019   Procedure: IR WITH ANESTHESIA;  Surgeon: Luanne Bras, MD;  Location: [place];  Service: Radiology;  Laterality: N/A;  . TONSILLECTOMY    . TUBAL LIGATION    . UMBILICAL HERNIA REPAIR N/A 01/25/2016  Procedure: LAPAROSCOPIC UMBILICAL HERNIA;  Surgeon: Ladora Daniel, MD;  Location: [facility] ORS;  Service: General;  Laterality: N/A;  . UMBILICAL HERNIA REPAIR N/A 10/20/2016   Procedure: HERNIA REPAIR UMBILICAL ADULT;  Surgeon: Leonie Green, MD;  Location: [facility] ORS;  Service: General;  Laterality: N/A;    There were no vitals filed for this visit.  Subjective Assessment - 10/27/19 2219    Subjective   Pt. reports that her hand has been stiffer over the  weekend.    Patient is accompanied by:  Family member    Pertinent History  Per pt. chart. Pt. is a 49 y.o. female with history of HTN, T2DM, obesity who was admitted on 07/15/19 with onset of HA progressing to flaccid  left hemiparesis a few hours later.  CT head showed acute nonhemorrhagic infarct in right basal ganglia with hyperdense right MCA sign.  CT A/P head neck showed perfusion deficit with occluded right ICA and slow flow in M1.  She underwent cerebral angiogram with revascularization of right MCA with T1C1 revascularization and repair of right ICA with flow diverter device. Stroke felt to be embolic likely due to ICA dissection. Postprocedure head CT was negative for bleed repeat CTA head neck showed reocclusion of right ICA origin and occlusion of right ICA stent but now with patent right MCA. Pt. received inpatient rehab services for 3 weeks, and home health services.    Limitations  LUE AROM    Currently in Pain?  No/denies       Neuro muscular re-education:  Pt. Tolerated weightbearing, and proprioceptive awareness through the LUE, and hand while seated at the mat. Pt. Required assist to position the LUE into position for weightbearing. Pt. also worked on alternating weightbearing with functional reaching, and grasping for cones, and placing them onto a low surface with cues, support, and assist for the LUE.Weightbearing, and proprioceptive input was performed in preparation for ROM, and facilitation of active movement.  Therapeutic Exercise:  Pt. worked on left shoulder stabilization exercises in supine with the shoulder flexed to 90 degrees, and the elbow in extension. Pt. was able to initiate active shoulder stabilization responses minimally with support provided proximally. Assist was also provided at the elbow to assist in maintaining elbow in extension. Pt. was able to perform scapular protraction with support provided at the elbow, and forearm. Pt. worked on hand to face patterns with support at the elbow, and hand. Pt. was able to perform elbow extension exercise against gravity. Pt. worked on wrist, and digit extension with facilitation, and alternating weightbearing. Pt. worked on active  supination with support at the forearm.   Manual Therapy:  Pt. Tolerated soft tissue mobilizations for metacarpal spread stretches independent of, and in preparation for ROM, and there. Ex.   Pt. reports that her hand was more stiff over the weekend, and that she hasn't been able to achieve digit extension as she was at the last session. Pt. Presented with limited active digit extension today. Pt. continues to work on facilitating active volitional ROM, shoulder stabilization, and increase engagement of the LUE during ADLs, and IADL tasks                 OT Education - 10/27/19 2220    Education Details  Left UE  functioning    Person(s) Educated  Patient;Spouse    Methods  Demonstration    Comprehension  Returned demonstration;Verbalized understanding          OT Long Term Goals -  09/08/19 1402      OT LONG TERM GOAL #1   Title  Pt. will increase left shoulder flexion PROM by 20 degrees to assist with UE dressing    Baseline  Eval: Shoulder flexion PROM 80 degrees    Time  12    Period  Weeks    Status  New    Target Date  12/01/19      OT LONG TERM GOAL #2   Title  Pt. will be able to actively stabilize left shoulder for 10 seconds in supine with shoulde flexed to 90 degrees, and elbow in extension in preparation for functional reaching.    Baseline  Eval: Pt.is unable to stabilize left shoulder    Time  12    Period  Weeks    Status  New    Target Date  12/01/19      OT LONG TERM GOAL #3   Title  Pt. will independently perform hand to face patterns with the LUE in preparation for light self grooming tasks.    Baseline  Eval: Pt. is unable to perform hand to face patterns with the LUE.    Time  12    Period  Weeks    Status  New    Target Date  12/01/19      OT LONG TERM GOAL #4   Title  Pt. will initiate 10 degrees of active left wrist extension  in preparation for actively lifting her hand off of a surface.    Baseline  Eval: Pt. is unable    Time   12    Period  Weeks    Status  New    Target Date  12/01/19      OT LONG TERM GOAL #5   Title  Pt. will initiate left hand digit extension in order to be able to actively release an objects during ADL tasks.    Baseline  Eval: Pt. is unable    Time  12    Period  Weeks    Status  New    Target Date  12/01/19      Long Term Additional Goals   Additional Long Term Goals  Yes      OT LONG TERM GOAL #6   Title  Pt. increase left hand digit flexion in order to be able to hold her toothbrush while applying toothpaste with her left right hand.    Baseline  Eval: Pt. is unable    Time  12    Period  Weeks    Status  New    Target Date  12/01/19      OT LONG TERM GOAL #7   Title  --            Plan - 10/27/19 2221    Clinical Impression Statement  Pt. reports that her hand was more stiff over the weekend, and that she hasn't been able to achieve digit extension as she was at the last session. Pt. Presented with limited active digit extension today. Pt. continues to work on facilitating active volitional ROM, shoulder stabilization, and increase engagement of the LUE during ADLs, and IADL tasks   OT Occupational Profile and History  Detailed Assessment- Review of Records and additional review of physical, cognitive, psychosocial history related to current functional performance    Occupational performance deficits (Please refer to evaluation for details):  ADL's;IADL's    Rehab Potential  Good    Clinical Decision Making  Several treatment  options, min-mod task modification necessary    Comorbidities Affecting Occupational Performance:  May have comorbidities impacting occupational performance    Modification or Assistance to Complete Evaluation   Min-Moderate modification of tasks or assist with assess necessary to complete eval    OT Frequency  2x / week    OT Duration  12 weeks    OT Treatment/Interventions  Self-care/ADL training;Moist Heat;DME and/or AE  instruction;Neuromuscular education;Therapeutic exercise;Therapeutic activities;Energy conservation;Patient/family education    Consulted and Agree with Plan of Care  Patient       Patient will benefit from skilled therapeutic intervention in order to improve the following deficits and impairments:           Visit Diagnosis: Muscle weakness (generalized)    Problem List Patient Active Problem List   Diagnosis Date Noted  . E. coli UTI   . Diabetes mellitus type 2 in obese ([place])   . Chronic pain of right knee   . 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 Hemiparesis affecting left side as late effect of stroke ([place])   . Acute ischemic right middle cerebral artery (MCA) stroke ([place]) 07/22/2019  . Carotid artery dissection  (HCC) s/p stent placement 07/21/2019  . Diabetes mellitus type II, uncontrolled ([place]) 07/21/2019  . Acute blood loss anemia 07/21/2019  . Leukocytosis 07/21/2019  . Stroke (cerebrum) ([place]) - R MCA infarct s/p tenecteplase and mechanical thrombectomy w/ d/t ICA dissection  07/15/2019  . Middle cerebral artery embolism, right 07/15/2019  . Controlled type 2 diabetes mellitus without complication, without long-term current use of insulin ([place]) 05/28/2017  . Attention deficit hyperactivity disorder (ADHD), combined type 04/18/2017  . Morbid obesity ([place]) 01/25/2016  . Umbilical hernia [id number]  . Hiatal hernia 12/08/2015  . History of Clostridium difficile colitis 06/19/2014  . HTN (hypertension) 12/23/2013  . PCOS (polycystic ovarian syndrome) 12/23/2013  . Bariatric surgery status 01/28/2013    Harrel Carina, MS, OTR/L 10/27/2019, 10:31 PM  [place] MAIN [facility] SERVICES [address] [place], [place], [address] Phone: [phone]   Fax:  [phone]  Name: CYRENITY AYLER MRN: [id number] Date of Birth: 06/06/1971

## 2019-10-27 NOTE — Therapy (Signed)
[place] MAIN [facility] SERVICES [address] [place], [place], [address] Phone: [phone]   Fax:  [phone]  Physical Therapy Treatment  Patient Details  Name: Kathryn Coffey MRN: [id number] Date of Birth: June 15, 1971 No data recorded  Encounter Date: 10/27/2019  PT End of Session - 10/28/19 1017    Visit Number  9    Number of Visits  36    Date for PT Re-Evaluation  11/03/19    PT Start Time  1345    PT Stop Time  1430    PT Time Calculation (min)  45 min    Equipment Utilized During Treatment  Gait belt    Activity Tolerance  Patient tolerated treatment well    Behavior During Therapy  WFL for tasks assessed/performed       Past Medical History:  Diagnosis Date  . Abdominal wall cellulitis 2013 and 2014  . Asthma    allergic to grasses  . B12 deficiency   . Complication of anesthesia   . Costochondritis   . Diabetes mellitus without complication ([place])   . Gastric anomaly    multiple small ulcers  . GERD (gastroesophageal reflux disease)   . Herpes 06/2018   POSSIBLY IN EYE- PT TAKING VALTREX AND WILL SEE OPTHAMOLOGIST TO SEE IF VALTREX IS WORKING  . History of abnormal cervical Pap smear   . History of Clostridium difficile colitis   . History of hiatal hernia   . History of kidney stones   . Hypertension   . IBS (irritable bowel syndrome)   . Migraine   . Morbid obesity ([place])    s/p attempted gastric banding now decompressed  . PCOS (polycystic ovarian syndrome)   . PONV (postoperative nausea and vomiting)    WITH SPINAL ONLY  . Stroke [facility])     Past Surgical History:  Procedure Laterality Date  . BREAST EXCISIONAL BIOPSY Left    cyst excision - Dr. Patty Sermons  . CESAREAN SECTION    . CHOLECYSTECTOMY    . COLONOSCOPY    . EAR CYST EXCISION Right 07/04/2018   Procedure: EXCISION GLOMUS TUMOR THUMBNAIL;  Surgeon: Corky Mull, MD;  Location: [facility] ORS;  Service: Orthopedics;  Laterality: Right;  .  ESOPHAGOGASTRODUODENOSCOPY    . ESOPHAGOGASTRODUODENOSCOPY (EGD) WITH PROPOFOL N/A 12/06/2015   Procedure: ESOPHAGOGASTRODUODENOSCOPY (EGD) WITH PROPOFOL;  Surgeon: Manya Silvas, MD;  Location: [facility] ENDOSCOPY;  Service: Endoscopy;  Laterality: N/A;  . IR ANGIO INTRA EXTRACRAN SEL INTERNAL CAROTID UNI L MOD SED  07/15/2019  . IR ANGIO VERTEBRAL SEL SUBCLAVIAN INNOMINATE UNI R MOD SED  07/15/2019  . IR CT HEAD LTD  07/15/2019  . IR INTRAVSC STENT CERV CAROTID W/O EMB-PROT MOD SED INC ANGIO  07/15/2019  . IR PERCUTANEOUS ART THROMBECTOMY/INFUSION INTRACRANIAL INC DIAG ANGIO  07/15/2019  . LAPAROSCOPIC GASTRIC BANDING    . LAPAROSCOPIC GASTRIC RESTRICTIVE DUODENAL PROCEDURE (DUODENAL SWITCH) N/A 01/25/2016   Procedure: LAPAROSCOPIC GASTRIC RESTRICTIVE DUODENAL PROCEDURE (DUODENAL SWITCH);  Surgeon: Ladora Daniel, MD;  Location: [facility] ORS;  Service: General;  Laterality: N/A;  . OVARY SURGERY     x2  . RADIOLOGY WITH ANESTHESIA N/A 07/15/2019   Procedure: IR WITH ANESTHESIA;  Surgeon: Luanne Bras, MD;  Location: [place];  Service: Radiology;  Laterality: N/A;  . TONSILLECTOMY    . TUBAL LIGATION    . UMBILICAL HERNIA REPAIR N/A 01/25/2016   Procedure: LAPAROSCOPIC UMBILICAL HERNIA;  Surgeon: Ladora Daniel, MD;  Location: [facility] ORS;  Service: General;  Laterality: N/A;  . UMBILICAL HERNIA REPAIR N/A 10/20/2016   Procedure: HERNIA REPAIR UMBILICAL ADULT;  Surgeon: Leonie Green, MD;  Location: [facility] ORS;  Service: General;  Laterality: N/A;    There were no vitals filed for this visit.  Subjective Assessment - 10/28/19 1012    Subjective  Patient reports she is doing well today. No resting pain reported upon arrival. No falls or LOB since last session. Compliant with HEP. She saw neurology since last therapy session with no significant changes in plan of care however she did get a prescription for a L knee cage orthosis to prevent hyperextension. She continues to have minimal function out  of LUE.    Pertinent History  Patient was in the hospital [place] and then in patient rehab 3 weeks. She was dicharged 08/13/19 and then HHPT and was discharged 09/03/19. She has not had any falls.    Currently in Pain?  No/denies           TREATMENT   Ther-ex  Supine L SLR x 15; Supine L manually resisted single leg press x 15; Supine L D1 flexion/extension with manual resistance from therapist x 15; Supine straight leg manually resisted hip abduction/adduction x 15 each; Attempted L single leg bridge however too difficulty for patient, instead performed with staggered foot position and RLE slightly extended x 10, stabilization required at L knee;  Seated exercises with 3# ankle weights: Marching x 15 bilateral; LAQ x 15 bilateral, assist to get L knee to terminal extension with hold;  Clams with red tband, therapist stabilizing R knee to encourage L hip strengthening x 15; YTB 3 way ankle: df//in/ev 15x each direction LLE  Sit to stand from elevated mat table with RLE slightly extended, verbal and tactile cues to minimize L knee hyperextension x 10; Standing L knee TKE with red tband resistance x 10;   Pt educated throughout session about proper posture and technique with exercises. Improved exercise technique, movement at target joints, use of target muscles after min to mod verbal, visual, tactile cues.   Pt demonstrates excellent motivation during session. She is able to complete supine, seated, and standing exercises with therapist. She demonstrates L quad weakness with L knee hyperextension in standing. Still unable to perform single leg bridge on LLE. Worked on terminal knee extension strength in sitting and standing. Pt encouraged to continue HEP. She will need a progress note at next visit. Pt will benefit from PT services to address deficits in strength, balance, and mobility in order to return to full function at home.                        PT  Short Term Goals - 09/08/19 1218      PT SHORT TERM GOAL #1   Title  Patient will be independent in home exercise program to improve strength/mobility for better functional independence with ADLs.    Time  6    Period  Weeks    Status  New    Target Date  10/20/19      PT SHORT TERM GOAL #2   Title  Patient (> 41 years old) will complete five times sit to stand test in < 15 seconds indicating an increased LE strength and improved balance.    Time  6    Period  Weeks    Status  New    Target Date  10/20/19  PT Long Term Goals - 09/08/19 1222      PT LONG TERM GOAL #1   Title  Patient will increase Berg Balance score by > 6 points to demonstrate decreased fall risk during functional activities.    Time  12    Period  Weeks    Status  New    Target Date  12/01/19      PT LONG TERM GOAL #2   Title  Patient will increase six minute walk test distance to >1200 for progression to community ambulator and improve gait ability    Time  12    Period  Weeks    Status  New    Target Date  12/01/19      PT LONG TERM GOAL #3   Title  Patient will increase 10 meter walk test to >1.46m/s as to improve gait speed for better community ambulation and to reduce fall risk    Time  12    Period  Weeks    Status  New    Target Date  12/01/19            Plan - 10/28/19 1018    Clinical Impression Statement  Pt demonstrates excellent motivation during session. She is able to complete supine, seated, and standing exercises with therapist. She demonstrates L quad weakness with L knee hyperextension in standing. Still unable to perform single leg bridge on LLE. Worked on terminal knee extension strength in sitting and standing. Pt encouraged to continue HEP. She will need a progress note at next visit. Pt will benefit from PT services to address deficits in strength, balance, and mobility in order to return to full function at home.    Personal Factors and Comorbidities  Comorbidity 1     Comorbidities  diabetes, high blood pressure, reflux,    Examination-Activity Limitations  Carry;Caring for Others;Bend;Locomotion Level;Self Feeding;Sleep;Toileting    Examination-Participation Restrictions  Cleaning;Community Activity;Driving;Laundry;Shop    Stability/Clinical Decision Making  Stable/Uncomplicated    Rehab Potential  Good    PT Frequency  2x / week    PT Duration  12 weeks    PT Treatment/Interventions  Manual techniques;Functional mobility training;Stair training;Gait training;Electrical Stimulation;Moist Heat;Ultrasound;Therapeutic exercise;Balance training;Neuromuscular re-education    PT Next Visit Plan  Update outcome measures and goals, progress note, strengthening and balance    PT Home Exercise Plan  standing hip abd,    Consulted and Agree with Plan of Care  Patient       Patient will benefit from skilled therapeutic intervention in order to improve the following deficits and impairments:  Abnormal gait, Decreased knowledge of use of DME, Dizziness, Pain, Decreased coordination, Impaired UE functional use, Decreased strength, Decreased endurance, Decreased activity tolerance, Decreased balance, Difficulty walking, Obesity  Visit Diagnosis: Muscle weakness (generalized)  Difficulty in walking, not elsewhere classified     Problem List Patient Active Problem List   Diagnosis Date Noted  . E. coli UTI   . Diabetes mellitus type 2 in obese ([place])   . Chronic pain of right knee   . 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 Hemiparesis affecting left side as late effect of stroke ([place])   . Acute ischemic right middle cerebral artery (MCA) stroke ([place]) 07/22/2019  . Carotid artery dissection  (HCC) s/p stent placement 07/21/2019  . Diabetes mellitus type II, uncontrolled ([place]) 07/21/2019  . Acute blood loss anemia 07/21/2019  . Leukocytosis 07/21/2019  . Stroke (cerebrum) ([place]) - R MCA infarct s/p tenecteplase  and mechanical thrombectomy w/ d/t ICA  dissection  07/15/2019  . Middle cerebral artery embolism, right 07/15/2019  . Controlled type 2 diabetes mellitus without complication, without long-term current use of insulin ([place]) 05/28/2017  . Attention deficit hyperactivity disorder (ADHD), combined type 04/18/2017  . Morbid obesity ([place]) 01/25/2016  . Umbilical hernia [id number]  . Hiatal hernia 12/08/2015  . History of Clostridium difficile colitis 06/19/2014  . HTN (hypertension) 12/23/2013  . PCOS (polycystic ovarian syndrome) 12/23/2013  . Bariatric surgery status 01/28/2013   Phillips Grout PT, DPT, GCS  Rosalia Mcavoy 10/28/2019, 10:23 AM  [place] MAIN [facility] SERVICES [address] [place], [place], [address] Phone: [phone]   Fax:  [phone]  Name: Kathryn Coffey MRN: [id number] Date of Birth: 10/14/1970

## 2019-10-29 ENCOUNTER — Ambulatory Visit: Payer: No Typology Code available for payment source | Admitting: Occupational Therapy

## 2019-10-29 ENCOUNTER — Encounter: Payer: Self-pay | Admitting: Occupational Therapy

## 2019-10-29 ENCOUNTER — Other Ambulatory Visit: Payer: Self-pay

## 2019-10-29 DIAGNOSIS — M6281 Muscle weakness (generalized): Secondary | ICD-10-CM | POA: Diagnosis not present

## 2019-10-29 NOTE — Therapy (Signed)
[place] MAIN [facility] SERVICES [address] [place], [place], [address] Phone: [phone]   Fax:  [phone]  Occupational Therapy Progress Note  Dates of reporting period  09/08/2019  to   12/01/2019  Patient Details  Name: AMIA DUPERE MRN: [id number] Date of Birth: Aug 10, 1971 Referring Provider (OT): Raulkar   Encounter Date: 10/29/2019  OT End of Session - 10/29/19 1716    Visit Number  10    Number of Visits  24    Date for OT Re-Evaluation  12/01/19    Authorization Type  Progress report period starting  on 09/08/2019    OT Start Time  1432    OT Stop Time  1515    OT Time Calculation (min)  43 min    Activity Tolerance  Patient tolerated treatment well    Behavior During Therapy  [facility] for tasks assessed/performed       Past Medical History:  Diagnosis Date  . Abdominal wall cellulitis 2013 and 2014  . Asthma    allergic to grasses  . B12 deficiency   . Complication of anesthesia   . Costochondritis   . Diabetes mellitus without complication ([place])   . Gastric anomaly    multiple small ulcers  . GERD (gastroesophageal reflux disease)   . Herpes 06/2018   POSSIBLY IN EYE- PT TAKING VALTREX AND WILL SEE OPTHAMOLOGIST TO SEE IF VALTREX IS WORKING  . History of abnormal cervical Pap smear   . History of Clostridium difficile colitis   . History of hiatal hernia   . History of kidney stones   . Hypertension   . IBS (irritable bowel syndrome)   . Migraine   . Morbid obesity ([place])    s/p attempted gastric banding now decompressed  . PCOS (polycystic ovarian syndrome)   . PONV (postoperative nausea and vomiting)    WITH SPINAL ONLY  . Stroke [facility])     Past Surgical History:  Procedure Laterality Date  . BREAST EXCISIONAL BIOPSY Left    cyst excision - Dr. Patty Sermons  . CESAREAN SECTION    . CHOLECYSTECTOMY    . COLONOSCOPY    . EAR CYST EXCISION Right 07/04/2018   Procedure: EXCISION GLOMUS TUMOR THUMBNAIL;   Surgeon: Corky Mull, MD;  Location: [facility] ORS;  Service: Orthopedics;  Laterality: Right;  . ESOPHAGOGASTRODUODENOSCOPY    . ESOPHAGOGASTRODUODENOSCOPY (EGD) WITH PROPOFOL N/A 12/06/2015   Procedure: ESOPHAGOGASTRODUODENOSCOPY (EGD) WITH PROPOFOL;  Surgeon: Manya Silvas, MD;  Location: [facility] ENDOSCOPY;  Service: Endoscopy;  Laterality: N/A;  . IR ANGIO INTRA EXTRACRAN SEL INTERNAL CAROTID UNI L MOD SED  07/15/2019  . IR ANGIO VERTEBRAL SEL SUBCLAVIAN INNOMINATE UNI R MOD SED  07/15/2019  . IR CT HEAD LTD  07/15/2019  . IR INTRAVSC STENT CERV CAROTID W/O EMB-PROT MOD SED INC ANGIO  07/15/2019  . IR PERCUTANEOUS ART THROMBECTOMY/INFUSION INTRACRANIAL INC DIAG ANGIO  07/15/2019  . LAPAROSCOPIC GASTRIC BANDING    . LAPAROSCOPIC GASTRIC RESTRICTIVE DUODENAL PROCEDURE (DUODENAL SWITCH) N/A 01/25/2016   Procedure: LAPAROSCOPIC GASTRIC RESTRICTIVE DUODENAL PROCEDURE (DUODENAL SWITCH);  Surgeon: Ladora Daniel, MD;  Location: [facility] ORS;  Service: General;  Laterality: N/A;  . OVARY SURGERY     x2  . RADIOLOGY WITH ANESTHESIA N/A 07/15/2019   Procedure: IR WITH ANESTHESIA;  Surgeon: Luanne Bras, MD;  Location: [place];  Service: Radiology;  Laterality: N/A;  . TONSILLECTOMY    . TUBAL LIGATION    . UMBILICAL HERNIA REPAIR  N/A 01/25/2016   Procedure: LAPAROSCOPIC UMBILICAL HERNIA;  Surgeon: Ladora Daniel, MD;  Location: [facility] ORS;  Service: General;  Laterality: N/A;  . UMBILICAL HERNIA REPAIR N/A 10/20/2016   Procedure: HERNIA REPAIR UMBILICAL ADULT;  Surgeon: Leonie Green, MD;  Location: [facility] ORS;  Service: General;  Laterality: N/A;    There were no vitals filed for this visit.  Subjective Assessment - 10/29/19 1715    Subjective   Pt. reports that she was able to stabilize her forearm during exercises at home with her husband.    Patient is accompanied by:  Family member    Pertinent History  Per pt. chart. Pt. is a 49 y.o. female with history of HTN, T2DM, obesity who was admitted  on 07/15/19 with onset of HA progressing to flaccid left hemiparesis a few hours later.  CT head showed acute nonhemorrhagic infarct in right basal ganglia with hyperdense right MCA sign.  CT A/P head neck showed perfusion deficit with occluded right ICA and slow flow in M1.  She underwent cerebral angiogram with revascularization of right MCA with T1C1 revascularization and repair of right ICA with flow diverter device. Stroke felt to be embolic likely due to ICA dissection. Postprocedure head CT was negative for bleed repeat CTA head neck showed reocclusion of right ICA origin and occlusion of right ICA stent but now with patent right MCA. Pt. received inpatient rehab services for 3 weeks, and home health services.    Currently in Pain?  No/denies       Neuro muscular re-education:  Pt. Tolerated weightbearing, and proprioceptive awareness through the LUE, and handwhile seated at the mat. Pt. Required assist to position the LUE into position for weightbearing. Pt. also worked on alternating weightbearing with functional reaching, and grasping for cones, and placing them onto a flat tabletop surface with cues, support, and assist for the LUE. Emphasis was placed on placing the the cones on the table, and actively relaxing grip on the cone in preparation for extending her hand off of the cones.Weightbearing, and proprioceptive input was performed in preparation for ROM, and facilitation of active movement.  Therapeutic Exercise:  Pt. worked on left shoulder stabilization exercises in supine with the shoulder flexed to 90 degrees, and the elbow in extension. Pt. was able toinitiate active shoulder stabilization responses minimally with support provided proximally.Assist was also provided at the elbow to assist in maintaining elbow in extension.Pt. was able to perform scapular protraction with support provided at the elbow, and forearm. Pt.worked on hand to face patterns with support at the elbow,  and hand. Pt. Worked on diagonal patterns with the LUE. Pt. was able to perform elbow extension exercise against gravity.Pt.worked on wrist, and digit extension with facilitation, andalternating weightbearing. Pt. worked on active supination with support at the forearm.   Manual Therapy:  Pt. Tolerated soft tissue mobilizations for metacarpal spread stretches independent of, and in preparation for ROM, and there. Ex.       Pt. is making steady progress. Pt. is able to consistently initiate active left shoulder horizontal abduction, elbow flexion, and extension, forearm supination, wrist extension, digit extension with facilitation, and thumb abduction. Pt. continues to work on improving LUE movements for hand to face pattens, and to increase LUE engagement during ADL, and IADL tasks.                OT Education - 10/29/19 1716    Education Details  Left UE  functioning    Person(s) Educated  Patient;Spouse    Methods  Demonstration    Comprehension  Returned demonstration;Verbalized understanding          OT Long Term Goals - 10/29/19 1726      OT LONG TERM GOAL #1   Title  Pt. will increase left shoulder flexion PROM by 20 degrees to assist with UE dressing    Baseline  Pt. continues to present with limited left shoulder PROM    Time  12    Period  Weeks    Status  On-going    Target Date  12/01/19      OT LONG TERM GOAL #2   Title  Pt. will be able to actively stabilize left shoulder for 10 seconds in supine with shoulde flexed to 90 degrees, and elbow in extension in preparation for functional reaching.    Baseline  Pt. conitnues to be unable to stabilize the left shoulder, although pt. is activating intermittent muscle responses.    Time  12    Period  Weeks    Status  On-going    Target Date  12/01/19      OT LONG TERM GOAL #3   Title  Pt. will independently perform hand to face patterns with the LUE in preparation for light self grooming tasks.     Baseline  Eval: Pt. is able to initiate performing hand to face patterns with the LUE however is unable to consitently reach, and susutain her hand at her face..    Time  12    Period  Weeks    Status  On-going    Target Date  12/01/19      OT LONG TERM GOAL #4   Title  Pt. will initiate 10 degrees of active left wrist extension  in preparation for actively lifting her hand off of a surface.    Baseline  Pt. is inconsistently able to activate wrist extensors with facilitation.    Time  12    Period  Weeks    Status  On-going    Target Date  12/01/19      OT LONG TERM GOAL #5   Title  Pt. will initiate left hand digit extension in order to be able to actively release an objects during ADL tasks.    Baseline  Pt. is able to initiate relaxing flexor grip consistently when holding objects.,    Time  12    Period  Weeks    Status  On-going    Target Date  12/01/19      OT LONG TERM GOAL #6   Title  Pt. increase left hand digit flexion in order to be able to hold her toothbrush while applying toothpaste with her left right hand.    Baseline  Pt. is initiating activating gross digit flexors.    Time  12    Period  Weeks    Status  On-going    Target Date  12/01/19            Plan - 10/29/19 1718    Clinical Impression Statement  Pt. is making steady progress. Pt. is able to consistently initiate active left shoulder horizontal abduction, elbow flexion, and extension, forearm supination, wrist extension, digit extension with facilitation, and thumb abduction. Pt. continues to work on improving LUE movements for hand to face pattens, and to increase LUE engagement during ADL, and IADL tasks.    OT Occupational Profile and History  Detailed Assessment- Review of Records and additional review of physical,  cognitive, psychosocial history related to current functional performance    Occupational performance deficits (Please refer to evaluation for details):  ADL's;IADL's    Rehab  Potential  Good    Clinical Decision Making  Several treatment options, min-mod task modification necessary    Comorbidities Affecting Occupational Performance:  May have comorbidities impacting occupational performance    Modification or Assistance to Complete Evaluation   Min-Moderate modification of tasks or assist with assess necessary to complete eval    OT Frequency  2x / week    OT Duration  12 weeks    OT Treatment/Interventions  Self-care/ADL training;Moist Heat;DME and/or AE instruction;Neuromuscular education;Therapeutic exercise;Therapeutic activities;Energy conservation;Patient/family education    Consulted and Agree with Plan of Care  Patient       Patient will benefit from skilled therapeutic intervention in order to improve the following deficits and impairments:           Visit Diagnosis: Muscle weakness (generalized)    Problem List Patient Active Problem List   Diagnosis Date Noted  . E. coli UTI   . Diabetes mellitus type 2 in obese ([place])   . Chronic pain of right knee   . 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 Hemiparesis affecting left side as late effect of stroke ([place])   . Acute ischemic right middle cerebral artery (MCA) stroke ([place]) 07/22/2019  . Carotid artery dissection  (HCC) s/p stent placement 07/21/2019  . Diabetes mellitus type II, uncontrolled ([place]) 07/21/2019  . Acute blood loss anemia 07/21/2019  . Leukocytosis 07/21/2019  . Stroke (cerebrum) ([place]) - R MCA infarct s/p tenecteplase and mechanical thrombectomy w/ d/t ICA dissection  07/15/2019  . Middle cerebral artery embolism, right 07/15/2019  . Controlled type 2 diabetes mellitus without complication, without long-term current use of insulin ([place]) 05/28/2017  . Attention deficit hyperactivity disorder (ADHD), combined type 04/18/2017  . Morbid obesity ([place]) 01/25/2016  . Umbilical hernia [id number]  . Hiatal hernia 12/08/2015  . History of Clostridium difficile colitis  06/19/2014  . HTN (hypertension) 12/23/2013  . PCOS (polycystic ovarian syndrome) 12/23/2013  . Bariatric surgery status 01/28/2013    Harrel Carina, MS, OTR/L 10/29/2019, 5:35 PM  [place] MAIN [facility] SERVICES [address] [place], [place], [address] Phone: [phone]   Fax:  [phone]  Name: SHALENA SEGRIST MRN: [id number] Date of Birth: 1971-06-25

## 2019-10-29 NOTE — Therapy (Addendum)
[place] MAIN [facility] SERVICES [address] [place], [place], [address] Phone: [phone]   Fax:  [phone]  Occupational Therapy Treatment  Patient Details  Name: Kathryn Coffey MRN: [id number] Date of Birth: 1970/12/15 Referring Provider (OT): Raulkar   Encounter Date: 09/24/2019    Past Medical History:  Diagnosis Date  . Abdominal wall cellulitis 2013 and 2014  . Asthma    allergic to grasses  . B12 deficiency   . Complication of anesthesia   . Costochondritis   . Diabetes mellitus without complication ([place])   . Gastric anomaly    multiple small ulcers  . GERD (gastroesophageal reflux disease)   . Herpes 06/2018   POSSIBLY IN EYE- PT TAKING VALTREX AND WILL SEE OPTHAMOLOGIST TO SEE IF VALTREX IS WORKING  . History of abnormal cervical Pap smear   . History of Clostridium difficile colitis   . History of hiatal hernia   . History of kidney stones   . Hypertension   . IBS (irritable bowel syndrome)   . Migraine   . Morbid obesity ([place])    s/p attempted gastric banding now decompressed  . PCOS (polycystic ovarian syndrome)   . PONV (postoperative nausea and vomiting)    WITH SPINAL ONLY  . Stroke [facility])     Past Surgical History:  Procedure Laterality Date  . BREAST EXCISIONAL BIOPSY Left    cyst excision - Dr. Patty Sermons  . CESAREAN SECTION    . CHOLECYSTECTOMY    . COLONOSCOPY    . EAR CYST EXCISION Right 07/04/2018   Procedure: EXCISION GLOMUS TUMOR THUMBNAIL;  Surgeon: Corky Mull, MD;  Location: [facility] ORS;  Service: Orthopedics;  Laterality: Right;  . ESOPHAGOGASTRODUODENOSCOPY    . ESOPHAGOGASTRODUODENOSCOPY (EGD) WITH PROPOFOL N/A 12/06/2015   Procedure: ESOPHAGOGASTRODUODENOSCOPY (EGD) WITH PROPOFOL;  Surgeon: Manya Silvas, MD;  Location: [facility] ENDOSCOPY;  Service: Endoscopy;  Laterality: N/A;  . IR ANGIO INTRA EXTRACRAN SEL INTERNAL CAROTID UNI L MOD SED  07/15/2019  . IR ANGIO VERTEBRAL SEL SUBCLAVIAN  INNOMINATE UNI R MOD SED  07/15/2019  . IR CT HEAD LTD  07/15/2019  . IR INTRAVSC STENT CERV CAROTID W/O EMB-PROT MOD SED INC ANGIO  07/15/2019  . IR PERCUTANEOUS ART THROMBECTOMY/INFUSION INTRACRANIAL INC DIAG ANGIO  07/15/2019  . LAPAROSCOPIC GASTRIC BANDING    . LAPAROSCOPIC GASTRIC RESTRICTIVE DUODENAL PROCEDURE (DUODENAL SWITCH) N/A 01/25/2016   Procedure: LAPAROSCOPIC GASTRIC RESTRICTIVE DUODENAL PROCEDURE (DUODENAL SWITCH);  Surgeon: Ladora Daniel, MD;  Location: [facility] ORS;  Service: General;  Laterality: N/A;  . OVARY SURGERY     x2  . RADIOLOGY WITH ANESTHESIA N/A 07/15/2019   Procedure: IR WITH ANESTHESIA;  Surgeon: Luanne Bras, MD;  Location: [place];  Service: Radiology;  Laterality: N/A;  . TONSILLECTOMY    . TUBAL LIGATION    . UMBILICAL HERNIA REPAIR N/A 01/25/2016   Procedure: LAPAROSCOPIC UMBILICAL HERNIA;  Surgeon: Ladora Daniel, MD;  Location: [facility] ORS;  Service: General;  Laterality: N/A;  . UMBILICAL HERNIA REPAIR N/A 10/20/2016   Procedure: HERNIA REPAIR UMBILICAL ADULT;  Surgeon: Leonie Green, MD;  Location: [facility] ORS;  Service: General;  Laterality: N/A;    There were no vitals filed for this visit.                             OT Long Term Goals - 10/29/19 1726      OT LONG TERM  GOAL #1   Title  Pt. will increase left shoulder flexion PROM by 20 degrees to assist with UE dressing    Baseline  Pt. continues to present with limited left shoulder PROM    Time  12    Period  Weeks    Status  On-going    Target Date  12/01/19      OT LONG TERM GOAL #2   Title  Pt. will be able to actively stabilize left shoulder for 10 seconds in supine with shoulde flexed to 90 degrees, and elbow in extension in preparation for functional reaching.    Baseline  Pt. conitnues to be unable to stabilize the left shoulder, although pt. is activating intermittent muscle responses.    Time  12    Period  Weeks    Status  On-going    Target Date   12/01/19      OT LONG TERM GOAL #3   Title  Pt. will independently perform hand to face patterns with the LUE in preparation for light self grooming tasks.    Baseline  Eval: Pt. is able to initiate performing hand to face patterns with the LUE however is unable to consitently reach, and susutain her hand at her face..    Time  12    Period  Weeks    Status  On-going    Target Date  12/01/19      OT LONG TERM GOAL #4   Title  Pt. will initiate 10 degrees of active left wrist extension  in preparation for actively lifting her hand off of a surface.    Baseline  Pt. is inconsistently able to activate wrist extensors with facilitation.    Time  12    Period  Weeks    Status  On-going    Target Date  12/01/19      OT LONG TERM GOAL #5   Title  Pt. will initiate left hand digit extension in order to be able to actively release an objects during ADL tasks.    Baseline  Pt. is able to initiate relaxing flexor grip consistently when holding objects.,    Time  12    Period  Weeks    Status  On-going    Target Date  12/01/19      OT LONG TERM GOAL #6   Title  Pt. increase left hand digit flexion in order to be able to hold her toothbrush while applying toothpaste with her left right hand.    Baseline  Pt. is initiating activating gross digit flexors.    Time  12    Period  Weeks    Status  On-going    Target Date  12/01/19              Patient will benefit from skilled therapeutic intervention in order to improve the following deficits and impairments:           Visit Diagnosis: Muscle weakness (generalized)    Problem List Patient Active Problem List   Diagnosis Date Noted  . E. coli UTI   . Diabetes mellitus type 2 in obese ([place])   . Chronic pain of right knee   . 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 Hemiparesis affecting left side as late effect of stroke ([place])   . Acute ischemic right middle cerebral artery (MCA) stroke ([place]) 07/22/2019  . Carotid  artery dissection  (HCC) s/p stent placement 07/21/2019  . Diabetes mellitus type II,  uncontrolled ([place]) 07/21/2019  . Acute blood loss anemia 07/21/2019  . Leukocytosis 07/21/2019  . Stroke (cerebrum) ([place]) - R MCA infarct s/p tenecteplase and mechanical thrombectomy w/ d/t ICA dissection  07/15/2019  . Middle cerebral artery embolism, right 07/15/2019  . Controlled type 2 diabetes mellitus without complication, without long-term current use of insulin ([place]) 05/28/2017  . Attention deficit hyperactivity disorder (ADHD), combined type 04/18/2017  . Morbid obesity ([place]) 01/25/2016  . Umbilical hernia [id number]  . Hiatal hernia 12/08/2015  . History of Clostridium difficile colitis 06/19/2014  . HTN (hypertension) 12/23/2013  . PCOS (polycystic ovarian syndrome) 12/23/2013  . Bariatric surgery status 01/28/2013    Harrel Carina 10/29/2019, 5:54 PM  [place] MAIN [facility] SERVICES [address] [place], [place], [address] Phone: [phone]   Fax:  [phone]  Name: KAYELYN MATTIA MRN: [id number] Date of Birth: Feb 27, 1971

## 2019-11-03 ENCOUNTER — Other Ambulatory Visit: Payer: Self-pay

## 2019-11-03 ENCOUNTER — Ambulatory Visit: Payer: No Typology Code available for payment source | Admitting: Occupational Therapy

## 2019-11-03 ENCOUNTER — Ambulatory Visit: Payer: No Typology Code available for payment source

## 2019-11-03 DIAGNOSIS — R262 Difficulty in walking, not elsewhere classified: Secondary | ICD-10-CM

## 2019-11-03 DIAGNOSIS — M6281 Muscle weakness (generalized): Secondary | ICD-10-CM

## 2019-11-03 NOTE — Therapy (Signed)
[place] MAIN [facility] SERVICES [address] [place], [place], [address] Phone: [phone]   Fax:  [phone]  Physical Therapy Treatment/Recertification/Progress Note   Dates of reporting period  09/08/19   to   11/03/19  Patient Details  Name: Kathryn Coffey MRN: [id number] Date of Birth: 05/30/1971 No data recorded  Encounter Date: 11/03/2019  PT End of Session - 11/03/19 1355    Visit Number  10    Number of Visits  36    Date for PT Re-Evaluation  01/26/20    PT Start Time  1350    PT Stop Time  1430    PT Time Calculation (min)  40 min    Equipment Utilized During Treatment  Gait belt    Activity Tolerance  Patient tolerated treatment well    Behavior During Therapy  WFL for tasks assessed/performed       Past Medical History:  Diagnosis Date  . Abdominal wall cellulitis 2013 and 2014  . Asthma    allergic to grasses  . B12 deficiency   . Complication of anesthesia   . Costochondritis   . Diabetes mellitus without complication ([place])   . Gastric anomaly    multiple small ulcers  . GERD (gastroesophageal reflux disease)   . Herpes 06/2018   POSSIBLY IN EYE- PT TAKING VALTREX AND WILL SEE OPTHAMOLOGIST TO SEE IF VALTREX IS WORKING  . History of abnormal cervical Pap smear   . History of Clostridium difficile colitis   . History of hiatal hernia   . History of kidney stones   . Hypertension   . IBS (irritable bowel syndrome)   . Migraine   . Morbid obesity ([place])    s/p attempted gastric banding now decompressed  . PCOS (polycystic ovarian syndrome)   . PONV (postoperative nausea and vomiting)    WITH SPINAL ONLY  . Stroke [facility])     Past Surgical History:  Procedure Laterality Date  . BREAST EXCISIONAL BIOPSY Left    cyst excision - Dr. Patty Sermons  . CESAREAN SECTION    . CHOLECYSTECTOMY    . COLONOSCOPY    . EAR CYST EXCISION Right 07/04/2018   Procedure: EXCISION GLOMUS TUMOR THUMBNAIL;  Surgeon: Corky Mull,  MD;  Location: [facility] ORS;  Service: Orthopedics;  Laterality: Right;  . ESOPHAGOGASTRODUODENOSCOPY    . ESOPHAGOGASTRODUODENOSCOPY (EGD) WITH PROPOFOL N/A 12/06/2015   Procedure: ESOPHAGOGASTRODUODENOSCOPY (EGD) WITH PROPOFOL;  Surgeon: Manya Silvas, MD;  Location: [facility] ENDOSCOPY;  Service: Endoscopy;  Laterality: N/A;  . IR ANGIO INTRA EXTRACRAN SEL INTERNAL CAROTID UNI L MOD SED  07/15/2019  . IR ANGIO VERTEBRAL SEL SUBCLAVIAN INNOMINATE UNI R MOD SED  07/15/2019  . IR CT HEAD LTD  07/15/2019  . IR INTRAVSC STENT CERV CAROTID W/O EMB-PROT MOD SED INC ANGIO  07/15/2019  . IR PERCUTANEOUS ART THROMBECTOMY/INFUSION INTRACRANIAL INC DIAG ANGIO  07/15/2019  . LAPAROSCOPIC GASTRIC BANDING    . LAPAROSCOPIC GASTRIC RESTRICTIVE DUODENAL PROCEDURE (DUODENAL SWITCH) N/A 01/25/2016   Procedure: LAPAROSCOPIC GASTRIC RESTRICTIVE DUODENAL PROCEDURE (DUODENAL SWITCH);  Surgeon: Ladora Daniel, MD;  Location: [facility] ORS;  Service: General;  Laterality: N/A;  . OVARY SURGERY     x2  . RADIOLOGY WITH ANESTHESIA N/A 07/15/2019   Procedure: IR WITH ANESTHESIA;  Surgeon: Luanne Bras, MD;  Location: [place];  Service: Radiology;  Laterality: N/A;  . TONSILLECTOMY    . TUBAL LIGATION    . UMBILICAL HERNIA REPAIR N/A 01/25/2016  Procedure: LAPAROSCOPIC UMBILICAL HERNIA;  Surgeon: Ladora Daniel, MD;  Location: [facility] ORS;  Service: General;  Laterality: N/A;  . UMBILICAL HERNIA REPAIR N/A 10/20/2016   Procedure: HERNIA REPAIR UMBILICAL ADULT;  Surgeon: Leonie Green, MD;  Location: [facility] ORS;  Service: General;  Laterality: N/A;    There were no vitals filed for this visit.  Subjective Assessment - 11/03/19 1352    Subjective  Patient reports she is doing well today. No resting pain reported upon arrival. No falls since last session but she does complain of some loss of balance. Compliant with HEP. She continues to have minimal function out of LUE.    Pertinent History  Patient was in the hospital [facility] and then in patient rehab 3 weeks. She was dicharged 08/13/19 and then HHPT and was discharged 09/03/19. She has not had any falls.    Currently in Pain?  No/denies         [facility] PT Assessment - 11/03/19 1406      6 Minute Walk- Baseline   6 Minute Walk- Baseline  yes    BP (mmHg)  (!) 143/92    HR (bpm)  86    02 Sat (%RA)  98 %    Modified Borg Scale for Dyspnea  0- Nothing at all    Perceived Rate of Exertion (Borg)  6-      6 Minute walk- Post Test   6 Minute Walk Post Test  yes    BP (mmHg)  (!) 158/94    HR (bpm)  110    02 Sat (%RA)  99 %    Modified Borg Scale for Dyspnea  2- Mild shortness of breath    Perceived Rate of Exertion (Borg)  8-      6 minute walk test results    Aerobic Endurance Distance Walked  817    Endurance additional comments  Quad cane in RUE, CGA throughout      Standardized Balance Assessment   Standardized Balance Assessment  Berg Balance Test      Berg Balance Test   Sit to Stand  Able to stand without using hands and stabilize independently    Standing Unsupported  Able to stand safely 2 minutes    Sitting with Back Unsupported but Feet Supported on Floor or Stool  Able to sit safely and securely 2 minutes    Stand to Sit  Sits safely with minimal use of hands    Transfers  Able to transfer safely, minor use of hands    Standing Unsupported with Eyes Closed  Able to stand 10 seconds safely    Standing Unsupported with Feet Together  Able to place feet together independently and stand 1 minute safely    From Standing, Reach Forward with Outstretched Arm  Can reach confidently >25 cm (10")    From Standing Position, Pick up Object from Floor  Able to pick up shoe safely and easily    From Standing Position, Turn to Look Behind Over each Shoulder  Looks behind from both sides and weight shifts well    Turn 360 Degrees  Able to turn 360 degrees safely but slowly    Standing Unsupported, Alternately Place Feet on Step/Stool  Able to stand  independently and safely and complete 8 steps in 20 seconds    Standing Unsupported, One Foot in Front  Able to plae foot ahead of the other independently and hold 30 seconds    Standing on One  Leg  Able to lift leg independently and hold equal to or more than 3 seconds    Total Score  51         TREATMENT   Neuromuscular Re-education   OUTCOME MEASURES: TEST Outcome Interpretation  5 times sit<>stand 12.2s (15.28) >60 yo, >15 sec indicates increased risk for falls  10 meter walk test   self-selected: 13.2s = 0.76 m/s, fastest: 12.5s = 0.8 m/s (0.51 m/s) <1.0 m/s indicates increased risk for falls; limited community ambulator  Timed up and Go   12.9s (19.36  sec) <14 sec indicates increased risk for falls  6 minute walk test  817'      (525     Feet) 1000 feet is community Water quality scientist 51/56; (48/56) <36/56 (100% risk for falls), 37-45 (80% risk for falls); 46-51 (>50% risk for falls); 52-55 (lower risk <25% of falls)         Ther-ex  Supine SLR with quad set to initiate x 10; Supine straight leg hip abduction/adduction with manual resistance by therapist x 10 each direction; Supine LLE leg press with manual resistance by therapist x 10;    Pt educated throughout session about proper posture and technique with exercises. Improved exercise technique, movement at target joints, use of target muscles after min to mod verbal, visual, tactile cues.    Outcome measures and goals updated with patient today. Pt demonstrates significant improvement in her 5TSTS, 6mgait speed (self-selected and fastest), TUG, 6MWT and BERG. She is making excellent progress toward all of her goals. She arrives to therapy with excellent motivation and has been performing her home exercise program. She will benefit from PT services to address deficits in strength, balance, and mobility in order to return to full function at home.                  PT Short Term Goals -  11/03/19 1356      PT SHORT TERM GOAL #1   Title  Patient will be independent in home exercise program to improve strength/mobility for better functional independence with ADLs.    Baseline  11/03/19: Pt performing HEP at least 5d/wk    Time  6    Period  Weeks    Status  Partially Met    Target Date  12/15/19      PT SHORT TERM GOAL #2   Title  Patient (> 616years old) will complete five times sit to stand test in < 15 seconds indicating an increased LE strength and improved balance.    Baseline  eval: 15.28s; 11/03/19: 12.2s    Time  6    Period  Weeks    Status  Achieved    Target Date  10/20/19        PT Long Term Goals - 11/03/19 1359      PT LONG TERM GOAL #1   Title  Patient will increase Berg Balance score by > 6 points to demonstrate decreased fall risk during functional activities.    Baseline  09/08/19: 48/56; 11/03/19: 51/56    Time  12    Period  Weeks    Status  Partially Met    Target Date  01/26/20      PT LONG TERM GOAL #2   Title  Patient will increase six minute walk test distance to >1200 for progression to community ambulator and improve gait ability    Baseline  09/08/19: 525', 11/03/19: 817';  Time  12    Period  Weeks    Status  Partially Met    Target Date  01/26/20      PT LONG TERM GOAL #3   Title  Patient will increase 10 meter walk test to >1.9ms as to improve gait speed for better community ambulation and to reduce fall risk    Baseline  09/08/19:  0.51 m/s; 11/03/19: self-selected: 13.2s = 0.76 m/s, fastest: 12.5s = 0.8 m/s    Time  12    Period  Weeks    Status  Partially Met    Target Date  01/26/20            Plan - 11/03/19 1355    Personal Factors and Comorbidities  Comorbidity 1    Comorbidities  diabetes, high blood pressure, reflux,    Examination-Activity Limitations  Carry;Caring for Others;Bend;Locomotion Level;Self Feeding;Sleep;Toileting    Examination-Participation Restrictions  Cleaning;Community  Activity;Driving;Laundry;Shop    Stability/Clinical Decision Making  Stable/Uncomplicated    Rehab Potential  Good    PT Frequency  2x / week    PT Duration  12 weeks    PT Treatment/Interventions  Manual techniques;Functional mobility training;Stair training;Gait training;Electrical Stimulation;Moist Heat;Ultrasound;Therapeutic exercise;Balance training;Neuromuscular re-education    PT Next Visit Plan  Update outcome measures and goals, progress note, strengthening and balance    PT Home Exercise Plan  standing hip abd,    Consulted and Agree with Plan of Care  Patient       Patient will benefit from skilled therapeutic intervention in order to improve the following deficits and impairments:  Abnormal gait, Decreased knowledge of use of DME, Dizziness, Pain, Decreased coordination, Impaired UE functional use, Decreased strength, Decreased endurance, Decreased activity tolerance, Decreased balance, Difficulty walking, Obesity  Visit Diagnosis: Muscle weakness (generalized)  Difficulty in walking, not elsewhere classified     Problem List Patient Active Problem List   Diagnosis Date Noted  . E. coli UTI   . Diabetes mellitus type 2 in obese ([place]   . Chronic pain of right knee   . Flaccid monoplegia of upper extremity ([place]   . Hemiparesis affecting left side as late effect of stroke ([place]   . Acute ischemic right middle cerebral artery (MCA) stroke ([place] 07/22/2019  . Carotid artery dissection  (HCC) s/p stent placement 07/21/2019  . Diabetes mellitus type II, uncontrolled ([place] 07/21/2019  . Acute blood loss anemia 07/21/2019  . Leukocytosis 07/21/2019  . Stroke (cerebrum) ([place] - R MCA infarct s/p tenecteplase and mechanical thrombectomy w/ d/t ICA dissection  07/15/2019  . Middle cerebral artery embolism, right 07/15/2019  . Controlled type 2 diabetes mellitus without complication, without long-term current use of insulin ([place] 05/28/2017  . Attention deficit hyperactivity  disorder (ADHD), combined type 04/18/2017  . Morbid obesity ([place] 01/25/2016  . Umbilical hernia 054/49/2010 . Hiatal hernia 12/08/2015  . History of Clostridium difficile colitis 06/19/2014  . HTN (hypertension) 12/23/2013  . PCOS (polycystic ovarian syndrome) 12/23/2013  . Bariatric surgery status 01/28/2013   JPhillips GroutPT, DPT, GCS  Dandra Velardi 11/03/2019, 5:15 PM  [place] [address] [address]Phone: [phone]  Fax:  [phone] Name: TMAKIA BOSSIMRN: [id number] of Birth: [birth year]

## 2019-11-04 ENCOUNTER — Encounter: Payer: Self-pay | Admitting: Occupational Therapy

## 2019-11-04 NOTE — Therapy (Signed)
[place] MAIN [facility] SERVICES [address] [place], [place], [address] Phone: [phone]   Fax:  [phone]  Occupational Therapy Treatment  Patient Details  Name: LIONELA ARAGONEZ MRN: [id number] Date of Birth: [birth year] Referring Provider (OT): Raulkar   Encounter Date: 11/03/2019  OT End of Session - 11/04/19 0937    Visit Number  11    Number of Visits  24    Date for OT Re-Evaluation  12/01/19    Authorization Type  Progress report period starting  on 09/08/2019    OT Start Time  1304    OT Stop Time  1345    OT Time Calculation (min)  41 min    Activity Tolerance  Patient tolerated treatment well    Behavior During Therapy  [facility] for tasks assessed/performed       Past Medical History:  Diagnosis Date  . Abdominal wall cellulitis 2013 and 2014  . Asthma    allergic to grasses  . B12 deficiency   . Complication of anesthesia   . Costochondritis   . Diabetes mellitus without complication ([place])   . Gastric anomaly    multiple small ulcers  . GERD (gastroesophageal reflux disease)   . Herpes 06/2018   POSSIBLY IN EYE- PT TAKING VALTREX AND WILL SEE OPTHAMOLOGIST TO SEE IF VALTREX IS WORKING  . History of abnormal cervical Pap smear   . History of Clostridium difficile colitis   . History of hiatal hernia   . History of kidney stones   . Hypertension   . IBS (irritable bowel syndrome)   . Migraine   . Morbid obesity ([place])    s/p attempted gastric banding now decompressed  . PCOS (polycystic ovarian syndrome)   . PONV (postoperative nausea and vomiting)    WITH SPINAL ONLY  . Stroke [facility])     Past Surgical History:  Procedure Laterality Date  . BREAST EXCISIONAL BIOPSY Left    cyst excision - Dr. Patty Sermons  . CESAREAN SECTION    . CHOLECYSTECTOMY    . COLONOSCOPY    . EAR CYST EXCISION Right 07/04/2018   Procedure: EXCISION GLOMUS TUMOR THUMBNAIL;  Surgeon: Corky Mull, MD;  Location: [facility] ORS;  Service:  Orthopedics;  Laterality: Right;  . ESOPHAGOGASTRODUODENOSCOPY    . ESOPHAGOGASTRODUODENOSCOPY (EGD) WITH PROPOFOL N/A 12/06/2015   Procedure: ESOPHAGOGASTRODUODENOSCOPY (EGD) WITH PROPOFOL;  Surgeon: Manya Silvas, MD;  Location: [facility] ENDOSCOPY;  Service: Endoscopy;  Laterality: N/A;  . IR ANGIO INTRA EXTRACRAN SEL INTERNAL CAROTID UNI L MOD SED  07/15/2019  . IR ANGIO VERTEBRAL SEL SUBCLAVIAN INNOMINATE UNI R MOD SED  07/15/2019  . IR CT HEAD LTD  07/15/2019  . IR INTRAVSC STENT CERV CAROTID W/O EMB-PROT MOD SED INC ANGIO  07/15/2019  . IR PERCUTANEOUS ART THROMBECTOMY/INFUSION INTRACRANIAL INC DIAG ANGIO  07/15/2019  . LAPAROSCOPIC GASTRIC BANDING    . LAPAROSCOPIC GASTRIC RESTRICTIVE DUODENAL PROCEDURE (DUODENAL SWITCH) N/A 01/25/2016   Procedure: LAPAROSCOPIC GASTRIC RESTRICTIVE DUODENAL PROCEDURE (DUODENAL SWITCH);  Surgeon: Ladora Daniel, MD;  Location: [facility] ORS;  Service: General;  Laterality: N/A;  . OVARY SURGERY     x2  . RADIOLOGY WITH ANESTHESIA N/A 07/15/2019   Procedure: IR WITH ANESTHESIA;  Surgeon: Luanne Bras, MD;  Location: [place];  Service: Radiology;  Laterality: N/A;  . TONSILLECTOMY    . TUBAL LIGATION    . UMBILICAL HERNIA REPAIR N/A 01/25/2016   Procedure: LAPAROSCOPIC UMBILICAL HERNIA;  Surgeon: Ladora Daniel,  MD;  Location: [facility] ORS;  Service: General;  Laterality: N/A;  . UMBILICAL HERNIA REPAIR N/A 10/20/2016   Procedure: HERNIA REPAIR UMBILICAL ADULT;  Surgeon: Leonie Green, MD;  Location: [facility] ORS;  Service: General;  Laterality: N/A;    There were no vitals filed for this visit.  Subjective Assessment - 11/04/19 0936    Subjective   Pt. reports that she is working with her hand at home    Patient is accompanied by:  Family member    Pertinent History  Per pt. chart. Pt. is a 49 y.o. female with history of HTN, T2DM, obesity who was admitted on 07/15/19 with onset of HA progressing to flaccid left hemiparesis a few hours later.  CT head showed  acute nonhemorrhagic infarct in right basal ganglia with hyperdense right MCA sign.  CT A/P head neck showed perfusion deficit with occluded right ICA and slow flow in M1.  She underwent cerebral angiogram with revascularization of right MCA with T1C1 revascularization and repair of right ICA with flow diverter device. Stroke felt to be embolic likely due to ICA dissection. Postprocedure head CT was negative for bleed repeat CTA head neck showed reocclusion of right ICA origin and occlusion of right ICA stent but now with patent right MCA. Pt. received inpatient rehab services for 3 weeks, and home health services.    Currently in Pain?  No/denies     OT Treatment   Neuro muscular re-education:  Pt. Tolerated weightbearing, and proprioceptive awareness through the LUE, and handwhile seated at the mat. Pt. Required assist to position the LUE into position for weightbearing. Pt. also worked on alternating weightbearing with functional reaching, and graspingfor cones, and placing them onto a flat tabletop surface with cues, support, and assist for the LUE. Emphasis was placed on placing the the cones on the table, and actively relaxing grip on the cone in preparation for extending her hand off of the cones.Weightbearing, and proprioceptive input was performed in preparation for ROM, and facilitation of active movement.  Therapeutic Exercise:  Pt. worked on left shoulder stabilization exercises in supine with the shoulder flexed to 90 degrees, and the elbow in extension. Pt. was able toinitiate active shoulder stabilization responses minimally with support provided proximally.Assist was also provided at the elbow to assist in maintaining elbow in extension.Pt. was able to perform scapular protraction with support provided at the elbow, and forearm. Pt.worked on hand to face patterns with support at the elbow, and hand. Pt. was able to perform elbow extension exercise against gravity.Pt. Worked on  [employer]  Using the [employer] from a seated position for shoulder flexion, and external rotation with support at the left elbow.Pt.worked on wrist, and digit extension with facilitation, andalternating weightbearing. Pt. worked on active supination with support at the forearm.   Pt. Continues to make steady progress. Pt. is able to consistently initiate active left shoulder horizontal abduction, elbow flexion, and extension, forearm supination, wrist extension, digit extension with facilitation, and thumb abduction. Pt. continues to work on improving LUE movements for hand to face pattens, and to increase LUE engagement during ADL, and IADL tasks.                       OT Education - 11/04/19 [phone]    Education Details  Left UE  functioning    Person(s) Educated  Patient;Spouse    Methods  Demonstration    Comprehension  Returned demonstration;Verbalized understanding  OT Long Term Goals - 10/29/19 1726      OT LONG TERM GOAL #1   Title  Pt. will increase left shoulder flexion PROM by 20 degrees to assist with UE dressing    Baseline  Pt. continues to present with limited left shoulder PROM    Time  12    Period  Weeks    Status  On-going    Target Date  12/01/19      OT LONG TERM GOAL #2   Title  Pt. will be able to actively stabilize left shoulder for 10 seconds in supine with shoulde flexed to 90 degrees, and elbow in extension in preparation for functional reaching.    Baseline  Pt. conitnues to be unable to stabilize the left shoulder, although pt. is activating intermittent muscle responses.    Time  12    Period  Weeks    Status  On-going    Target Date  12/01/19      OT LONG TERM GOAL #3   Title  Pt. will independently perform hand to face patterns with the LUE in preparation for light self grooming tasks.    Baseline  Eval: Pt. is able to initiate performing hand to face patterns with the LUE however is unable to consitently reach, and susutain  her hand at her face..    Time  12    Period  Weeks    Status  On-going    Target Date  12/01/19      OT LONG TERM GOAL #4   Title  Pt. will initiate 10 degrees of active left wrist extension  in preparation for actively lifting her hand off of a surface.    Baseline  Pt. is inconsistently able to activate wrist extensors with facilitation.    Time  12    Period  Weeks    Status  On-going    Target Date  12/01/19      OT LONG TERM GOAL #5   Title  Pt. will initiate left hand digit extension in order to be able to actively release an objects during ADL tasks.    Baseline  Pt. is able to initiate relaxing flexor grip consistently when holding objects.,    Time  12    Period  Weeks    Status  On-going    Target Date  12/01/19      OT LONG TERM GOAL #6   Title  Pt. increase left hand digit flexion in order to be able to hold her toothbrush while applying toothpaste with her left right hand.    Baseline  Pt. is initiating activating gross digit flexors.    Time  12    Period  Weeks    Status  On-going    Target Date  12/01/19            Plan - 11/04/19 0939    Clinical Impression Statement Pt. Continues to make steady progress. Pt. is able to consistently initiate active left shoulder horizontal abduction, elbow flexion, and extension, forearm supination, wrist extension, digit extension with facilitation, and thumb abduction. Pt. continues to work on improving LUE movements for hand to face pattens, and to increase LUE engagement during ADL, and IADL tasks.    OT Occupational Profile and History  Detailed Assessment- Review of Records and additional review of physical, cognitive, psychosocial history related to current functional performance    Occupational performance deficits (Please refer to evaluation for details):  ADL's;IADL's  Rehab Potential  Good    Clinical Decision Making  Several treatment options, min-mod task modification necessary    Comorbidities Affecting  Occupational Performance:  May have comorbidities impacting occupational performance    Modification or Assistance to Complete Evaluation   Min-Moderate modification of tasks or assist with assess necessary to complete eval    OT Frequency  2x / week    OT Duration  12 weeks    OT Treatment/Interventions  Self-care/ADL training;Moist Heat;DME and/or AE instruction;Neuromuscular education;Therapeutic exercise;Therapeutic activities;Energy conservation;Patient/family education    Consulted and Agree with Plan of Care  Patient       Patient will benefit from skilled therapeutic intervention in order to improve the following deficits and impairments:           Visit Diagnosis: Muscle weakness (generalized)    Problem List Patient Active Problem List   Diagnosis Date Noted  . E. coli UTI   . Diabetes mellitus type 2 in obese ([place])   . Chronic pain of right knee   . 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 Hemiparesis affecting left side as late effect of stroke ([place])   . Acute ischemic right middle cerebral artery (MCA) stroke ([place]) 07/22/2019  . Carotid artery dissection  (HCC) s/p stent placement 07/21/2019  . Diabetes mellitus type II, uncontrolled ([place]) 07/21/2019  . Acute blood loss anemia 07/21/2019  . Leukocytosis 07/21/2019  . Stroke (cerebrum) ([place]) - R MCA infarct s/p tenecteplase and mechanical thrombectomy w/ d/t ICA dissection  07/15/2019  . Middle cerebral artery embolism, right 07/15/2019  . Controlled type 2 diabetes mellitus without complication, without long-term current use of insulin ([place]) 05/28/2017  . Attention deficit hyperactivity disorder (ADHD), combined type 04/18/2017  . Morbid obesity ([place]) 01/25/2016  . Umbilical hernia [id number]  . Hiatal hernia 12/08/2015  . History of Clostridium difficile colitis 06/19/2014  . HTN (hypertension) 12/23/2013  . PCOS (polycystic ovarian syndrome) 12/23/2013  . Bariatric surgery status 01/28/2013     Harrel Carina, MS, OTR/L 11/04/2019, 9:45 AM  [place] MAIN [facility] SERVICES [address] [place], [place], [address] Phone: [phone]   Fax:  [phone]  Name: ZARLI BROOKSHIRE MRN: [id number] Date of Birth: 06/23/1971

## 2019-11-05 ENCOUNTER — Other Ambulatory Visit: Payer: Self-pay

## 2019-11-05 ENCOUNTER — Encounter: Payer: Self-pay | Admitting: Occupational Therapy

## 2019-11-05 ENCOUNTER — Ambulatory Visit: Payer: No Typology Code available for payment source | Admitting: Occupational Therapy

## 2019-11-05 DIAGNOSIS — M6281 Muscle weakness (generalized): Secondary | ICD-10-CM | POA: Diagnosis not present

## 2019-11-05 DIAGNOSIS — R278 Other lack of coordination: Secondary | ICD-10-CM

## 2019-11-05 NOTE — Therapy (Signed)
[place] [facility] SERVICES [address] [place], [place], [address] Phone: [phone]   Fax:  [phone]  Occupational Therapy Treatment  Patient Details  Name: Kathryn Coffey MRN: [id number] Date of Birth: 1970/10/29 Referring Provider (OT): Raulkar   Encounter Date: 11/05/2019  OT End of Session - 11/05/19 [id number]    Visit Number  12    Number of Visits  24    Date for OT Re-Evaluation  12/01/19    Authorization Type  Progress report period starting  on 09/08/2019    Authorization Time Period  FOTO: 21    OT Start Time  1348    OT Stop Time  1430    OT Time Calculation (min)  42 min    Activity Tolerance  Patient tolerated treatment well    Behavior During Therapy  [facility] for tasks assessed/performed       Past Medical History:  Diagnosis Date  . Abdominal wall cellulitis 2013 and 2014  . Asthma    allergic to grasses  . B12 deficiency   . Complication of anesthesia   . Costochondritis   . Diabetes mellitus without complication ([place])   . Gastric anomaly    multiple small ulcers  . GERD (gastroesophageal reflux disease)   . Herpes 06/2018   POSSIBLY IN EYE- PT TAKING VALTREX AND WILL SEE OPTHAMOLOGIST TO SEE IF VALTREX IS WORKING  . History of abnormal cervical Pap smear   . History of Clostridium difficile colitis   . History of hiatal hernia   . History of kidney stones   . Hypertension   . IBS (irritable bowel syndrome)   . Migraine   . Morbid obesity ([place])    s/p attempted gastric banding now decompressed  . PCOS (polycystic ovarian syndrome)   . PONV (postoperative nausea and vomiting)    WITH SPINAL ONLY  . Stroke [facility])     Past Surgical History:  Procedure Laterality Date  . BREAST EXCISIONAL BIOPSY Left    cyst excision - Dr. Patty Sermons  . CESAREAN SECTION    . CHOLECYSTECTOMY    . COLONOSCOPY    . EAR CYST EXCISION Right 07/04/2018   Procedure: EXCISION GLOMUS TUMOR THUMBNAIL;  Surgeon: Corky Mull, MD;   Location: [facility] ORS;  Service: Orthopedics;  Laterality: Right;  . ESOPHAGOGASTRODUODENOSCOPY    . ESOPHAGOGASTRODUODENOSCOPY (EGD) WITH PROPOFOL N/A 12/06/2015   Procedure: ESOPHAGOGASTRODUODENOSCOPY (EGD) WITH PROPOFOL;  Surgeon: Manya Silvas, MD;  Location: [facility] ENDOSCOPY;  Service: Endoscopy;  Laterality: N/A;  . IR ANGIO INTRA EXTRACRAN SEL INTERNAL CAROTID UNI L MOD SED  07/15/2019  . IR ANGIO VERTEBRAL SEL SUBCLAVIAN INNOMINATE UNI R MOD SED  07/15/2019  . IR CT HEAD LTD  07/15/2019  . IR INTRAVSC STENT CERV CAROTID W/O EMB-PROT MOD SED INC ANGIO  07/15/2019  . IR PERCUTANEOUS ART THROMBECTOMY/INFUSION INTRACRANIAL INC DIAG ANGIO  07/15/2019  . LAPAROSCOPIC GASTRIC BANDING    . LAPAROSCOPIC GASTRIC RESTRICTIVE DUODENAL PROCEDURE (DUODENAL SWITCH) N/A 01/25/2016   Procedure: LAPAROSCOPIC GASTRIC RESTRICTIVE DUODENAL PROCEDURE (DUODENAL SWITCH);  Surgeon: Ladora Daniel, MD;  Location: [facility] ORS;  Service: General;  Laterality: N/A;  . OVARY SURGERY     x2  . RADIOLOGY WITH ANESTHESIA N/A 07/15/2019   Procedure: IR WITH ANESTHESIA;  Surgeon: Luanne Bras, MD;  Location: [place];  Service: Radiology;  Laterality: N/A;  . TONSILLECTOMY    . TUBAL LIGATION    . UMBILICAL HERNIA REPAIR N/A 01/25/2016  Procedure: LAPAROSCOPIC UMBILICAL HERNIA;  Surgeon: Ladora Daniel, MD;  Location: [facility] ORS;  Service: General;  Laterality: N/A;  . UMBILICAL HERNIA REPAIR N/A 10/20/2016   Procedure: HERNIA REPAIR UMBILICAL ADULT;  Surgeon: Leonie Green, MD;  Location: [facility] ORS;  Service: General;  Laterality: N/A;    There were no vitals filed for this visit.  Subjective Assessment - 11/05/19 1531    Patient is accompanied by:  Family member    Pertinent History  Per pt. chart. Pt. is a 49 y.o. female with history of HTN, T2DM, obesity who was admitted on 07/15/19 with onset of HA progressing to flaccid left hemiparesis a few hours later.  CT head showed acute nonhemorrhagic infarct in right  basal ganglia with hyperdense right MCA sign.  CT A/P head neck showed perfusion deficit with occluded right ICA and slow flow in M1.  She underwent cerebral angiogram with revascularization of right MCA with T1C1 revascularization and repair of right ICA with flow diverter device. Stroke felt to be embolic likely due to ICA dissection. Postprocedure head CT was negative for bleed repeat CTA head neck showed reocclusion of right ICA origin and occlusion of right ICA stent but now with patent right MCA. Pt. received inpatient rehab services for 3 weeks, and home health services.    Currently in Pain?  Yes    Pain Score  --   Shoulder discomofort at the end range of shoulder flexion: Not rated.     OT Treatment  Neuro muscular re-education:  Pt. Tolerated weightbearing, and proprioceptive awareness through the LUE, and handwhile seated at the mat. Pt. required assist to position the LUE into position for weightbearing. Pt. also worked on alternating weightbearing with functional reaching, and graspingfor cones, and placing them onto aflat tabletopsurface with cues, support, and assist for the LUE. Emphasis was placed on placing the the cones on the table, and actively relaxing grip on the cone in preparation for extending herhand off of the cones.Weightbearing, and proprioceptive input was performed in preparation for ROM, and facilitation of active movement.  Therapeutic Exercise:  Pt. worked on left shoulder stabilization exercises in supine with the shoulder flexed to 90 degrees, and the elbow in extension. Pt. was able toinitiate active shoulder stabilization responses minimally with support provided proximally.Assist was also provided at the elbow to assist in maintaining elbow in extension.Pt. was able to perform scapular protraction with support provided at the elbow, and forearm. Pt.worked on hand to face patterns with support at the elbow, and hand.Pt. was able to perform elbow  extension exercise against gravity.Pt. Worked on [employer] from a seated position for shoulder flexion, and external rotation with support at the left elbow.Pt.worked on wrist, and digit extension with facilitation, andalternating weightbearing. Pt. worked on active supination with support at the forearm.     Pt. Education was provided about applying the GivMohr brace for the LUE, as well as other shoulder harness support brace options to allow more freedom of motion distally. Pt. continues to make steady progress. Pt. is able to consistently initiate active left shoulder horizontal abduction, elbow flexion, and extension, forearm supination, wrist extension, digit extension with facilitation, and thumb abduction. Pt. was able to achieve increased left wrist, aw well as 2nd and 5th digit extension. Pt. continues to work on improving LUE movements for hand to face pattens, and to increase LUE engagement during ADLs, and IADL tasks.Pt. was provided with red foam tubing to place a spoon onto  for practicing hand-to face patterns in preparation for self-feeding.                       OT Education - 11/05/19 1532    Education Details  Left UE  functioning    Person(s) Educated  Patient;Spouse    Methods  Demonstration    Comprehension  Returned demonstration;Verbalized understanding          OT Long Term Goals - 10/29/19 1726      OT LONG TERM GOAL #1   Title  Pt. will increase left shoulder flexion PROM by 20 degrees to assist with UE dressing    Baseline  Pt. continues to present with limited left shoulder PROM    Time  12    Period  Weeks    Status  On-going    Target Date  12/01/19      OT LONG TERM GOAL #2   Title  Pt. will be able to actively stabilize left shoulder for 10 seconds in supine with shoulde flexed to 90 degrees, and elbow in extension in preparation for functional reaching.    Baseline  Pt. conitnues to be unable to stabilize the  left shoulder, although pt. is activating intermittent muscle responses.    Time  12    Period  Weeks    Status  On-going    Target Date  12/01/19      OT LONG TERM GOAL #3   Title  Pt. will independently perform hand to face patterns with the LUE in preparation for light self grooming tasks.    Baseline  Eval: Pt. is able to initiate performing hand to face patterns with the LUE however is unable to consitently reach, and susutain her hand at her face..    Time  12    Period  Weeks    Status  On-going    Target Date  12/01/19      OT LONG TERM GOAL #4   Title  Pt. will initiate 10 degrees of active left wrist extension  in preparation for actively lifting her hand off of a surface.    Baseline  Pt. is inconsistently able to activate wrist extensors with facilitation.    Time  12    Period  Weeks    Status  On-going    Target Date  12/01/19      OT LONG TERM GOAL #5   Title  Pt. will initiate left hand digit extension in order to be able to actively release an objects during ADL tasks.    Baseline  Pt. is able to initiate relaxing flexor grip consistently when holding objects.,    Time  12    Period  Weeks    Status  On-going    Target Date  12/01/19      OT LONG TERM GOAL #6   Title  Pt. increase left hand digit flexion in order to be able to hold her toothbrush while applying toothpaste with her left right hand.    Baseline  Pt. is initiating activating gross digit flexors.    Time  12    Period  Weeks    Status  On-going    Target Date  12/01/19            Plan - 11/05/19 1533    Clinical Impression Statement Pt. Education was provided about applying the GivMohr brace for the LUE, as well as other shoulder harness support brace options to  allow more freedom of motion distally. Pt. continues to make steady progress. Pt. is able to consistently initiate active left shoulder horizontal abduction, elbow flexion, and extension, forearm supination, wrist extension, digit  extension with facilitation, and thumb abduction. Pt. was able to achieve increased left wrist, aw well as 2nd and 5th digit extension. Pt. continues to work on improving LUE movements for hand to face pattens, and to increase LUE engagement during ADLs, and IADL tasks.Pt. was provided with red foam tubing to place a spoon onto for practicing hand-to face patterns in preparation for self-feeding.    OT Occupational Profile and History  Detailed Assessment- Review of Records and additional review of physical, cognitive, psychosocial history related to current functional performance    Occupational performance deficits (Please refer to evaluation for details):  ADL's;IADL's    Rehab Potential  Good    Clinical Decision Making  Several treatment options, min-mod task modification necessary    Comorbidities Affecting Occupational Performance:  May have comorbidities impacting occupational performance    Modification or Assistance to Complete Evaluation   Min-Moderate modification of tasks or assist with assess necessary to complete eval    OT Frequency  2x / week    OT Duration  12 weeks    OT Treatment/Interventions  Self-care/ADL training;Moist Heat;DME and/or AE instruction;Neuromuscular education;Therapeutic exercise;Therapeutic activities;Energy conservation;Patient/family education    Consulted and Agree with Plan of Care  Patient       Patient will benefit from skilled therapeutic intervention in order to improve the following deficits and impairments:           Visit Diagnosis: Muscle weakness (generalized)  Other lack of coordination    Problem List Patient Active Problem List   Diagnosis Date Noted  . E. coli UTI   . Diabetes mellitus type 2 in obese ([place])   . Chronic pain of right knee   . 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 Hemiparesis affecting left side as late effect of stroke ([place])   . Acute ischemic right middle cerebral artery (MCA) stroke ([place])  07/22/2019  . Carotid artery dissection  (H[facility]) s/p stent placement 07/21/2019  . Diabetes mellitus type II, uncontrolled ([place]) 07/21/2019  . Acute blood loss anemia 07/21/2019  . Leukocytosis 07/21/2019  . Stroke (cerebrum) ([place]) - R MCA infarct s/p tenecteplase and mechanical thrombectomy w/ d/t ICA dissection  07/15/2019  . Middle cerebral artery embolism, right 07/15/2019  . Controlled type 2 diabetes mellitus without complication, without long-term current use of insulin ([place]) 05/28/2017  . Attention deficit hyperactivity disorder (ADHD), combined type 04/18/2017  . Morbid obesity ([place]) 01/25/2016  . Umbilical hernia [id number]  . Hiatal hernia 12/08/2015  . History of Clostridium difficile colitis 06/19/2014  . HTN (hypertension) 12/23/2013  . PCOS (polycystic ovarian syndrome) 12/23/2013  . Bariatric surgery status 01/28/2013    Harrel Carina, MS, OTR/L 11/05/2019, 3:39 PM  [place] [facility] SERVICES [address] [place], [place], [address] Phone: [phone]   Fax:  [phone]  Name: Kathryn Coffey MRN: [id number] Date of Birth: Apr 20, 1971

## 2019-11-08 ENCOUNTER — Other Ambulatory Visit: Payer: Self-pay

## 2019-11-10 ENCOUNTER — Ambulatory Visit: Payer: No Typology Code available for payment source | Admitting: Occupational Therapy

## 2019-11-10 ENCOUNTER — Encounter: Payer: Self-pay | Admitting: Occupational Therapy

## 2019-11-10 ENCOUNTER — Other Ambulatory Visit: Payer: Self-pay

## 2019-11-10 DIAGNOSIS — R278 Other lack of coordination: Secondary | ICD-10-CM

## 2019-11-10 DIAGNOSIS — M6281 Muscle weakness (generalized): Secondary | ICD-10-CM | POA: Diagnosis not present

## 2019-11-10 MED ORDER — TOPIRAMATE 25 MG PO TABS: 25.0000 mg | ORAL_TABLET | Freq: Every day | ORAL | 0 refills | Status: DC

## 2019-11-10 NOTE — Therapy (Addendum)
[place] MAIN [facility] SERVICES [address] [place], [place], [address] Phone: [phone]   Fax:  [phone]  Occupational Therapy Treatment  Patient Details  Name: Kathryn Coffey MRN: [id number] Date of Birth: September 30, 1970 Referring Provider (OT): Raulkar   Encounter Date: 11/10/2019  OT End of Session - 11/10/19 1958    Visit Number  13   Number of Visits  24    Date for OT Re-Evaluation  12/01/19    Authorization Type  Progress report period starting  on 09/08/2019    Authorization Time Period  FOTO: 37    OT Start Time  1518    OT Stop Time  1600    OT Time Calculation (min)  42 min    Activity Tolerance  Patient tolerated treatment well    Behavior During Therapy  [facility] for tasks assessed/performed       Past Medical History:  Diagnosis Date  . Abdominal wall cellulitis 2013 and 2014  . Asthma    allergic to grasses  . B12 deficiency   . Complication of anesthesia   . Costochondritis   . Diabetes mellitus without complication ([place])   . Gastric anomaly    multiple small ulcers  . GERD (gastroesophageal reflux disease)   . Herpes 06/2018   POSSIBLY IN EYE- PT TAKING VALTREX AND WILL SEE OPTHAMOLOGIST TO SEE IF VALTREX IS WORKING  . History of abnormal cervical Pap smear   . History of Clostridium difficile colitis   . History of hiatal hernia   . History of kidney stones   . Hypertension   . IBS (irritable bowel syndrome)   . Migraine   . Morbid obesity ([place])    s/p attempted gastric banding now decompressed  . PCOS (polycystic ovarian syndrome)   . PONV (postoperative nausea and vomiting)    WITH SPINAL ONLY  . Stroke [facility])     Past Surgical History:  Procedure Laterality Date  . BREAST EXCISIONAL BIOPSY Left    cyst excision - Dr. Patty Sermons  . CESAREAN SECTION    . CHOLECYSTECTOMY    . COLONOSCOPY    . EAR CYST EXCISION Right 07/04/2018   Procedure: EXCISION GLOMUS TUMOR THUMBNAIL;  Surgeon: Corky Mull, MD;   Location: [facility] ORS;  Service: Orthopedics;  Laterality: Right;  . ESOPHAGOGASTRODUODENOSCOPY    . ESOPHAGOGASTRODUODENOSCOPY (EGD) WITH PROPOFOL N/A 12/06/2015   Procedure: ESOPHAGOGASTRODUODENOSCOPY (EGD) WITH PROPOFOL;  Surgeon: Manya Silvas, MD;  Location: [facility] ENDOSCOPY;  Service: Endoscopy;  Laterality: N/A;  . IR ANGIO INTRA EXTRACRAN SEL INTERNAL CAROTID UNI L MOD SED  07/15/2019  . IR ANGIO VERTEBRAL SEL SUBCLAVIAN INNOMINATE UNI R MOD SED  07/15/2019  . IR CT HEAD LTD  07/15/2019  . IR INTRAVSC STENT CERV CAROTID W/O EMB-PROT MOD SED INC ANGIO  07/15/2019  . IR PERCUTANEOUS ART THROMBECTOMY/INFUSION INTRACRANIAL INC DIAG ANGIO  07/15/2019  . LAPAROSCOPIC GASTRIC BANDING    . LAPAROSCOPIC GASTRIC RESTRICTIVE DUODENAL PROCEDURE (DUODENAL SWITCH) N/A 01/25/2016   Procedure: LAPAROSCOPIC GASTRIC RESTRICTIVE DUODENAL PROCEDURE (DUODENAL SWITCH);  Surgeon: Ladora Daniel, MD;  Location: [facility] ORS;  Service: General;  Laterality: N/A;  . OVARY SURGERY     x2  . RADIOLOGY WITH ANESTHESIA N/A 07/15/2019   Procedure: IR WITH ANESTHESIA;  Surgeon: Luanne Bras, MD;  Location: [place];  Service: Radiology;  Laterality: N/A;  . TONSILLECTOMY    . TUBAL LIGATION    . UMBILICAL HERNIA REPAIR N/A 01/25/2016   Procedure:  LAPAROSCOPIC UMBILICAL HERNIA;  Surgeon: Ladora Daniel, MD;  Location: [facility] ORS;  Service: General;  Laterality: N/A;  . UMBILICAL HERNIA REPAIR N/A 10/20/2016   Procedure: HERNIA REPAIR UMBILICAL ADULT;  Surgeon: Leonie Green, MD;  Location: [facility] ORS;  Service: General;  Laterality: N/A;    There were no vitals filed for this visit.  Subjective Assessment - 11/10/19 1957    Subjective   Pt. reports that she was able to achieve wrist extension this weekend.    Patient is accompanied by:  Family member    Pertinent History  Per pt. chart. Pt. is a 49 y.o. female with history of HTN, T2DM, obesity who was admitted on 07/15/19 with onset of HA progressing to flaccid left  hemiparesis a few hours later.  CT head showed acute nonhemorrhagic infarct in right basal ganglia with hyperdense right MCA sign.  CT A/P head neck showed perfusion deficit with occluded right ICA and slow flow in M1.  She underwent cerebral angiogram with revascularization of right MCA with T1C1 revascularization and repair of right ICA with flow diverter device. Stroke felt to be embolic likely due to ICA dissection. Postprocedure head CT was negative for bleed repeat CTA head neck showed reocclusion of right ICA origin and occlusion of right ICA stent but now with patent right MCA. Pt. received inpatient rehab services for 3 weeks, and home health services.       OT Treatment  Neuro muscular re-education:  Pt. tolerated weightbearing, and proprioceptive awareness through the LUE, and handwhile seated at the mat. Pt. continues to require assist to position the LUE into position for weightbearing. Pt. also worked on alternating weightbearing with functional reaching, and graspingfor cones, and placing them onto an elevatedflat tabletopsurface with cues, support, and assist for the LUE. Emphasis was placed on placing the the cones on the table, and actively relaxing grip on the cone in preparation for extending herhand off of the cones.Weightbearing, and proprioceptive input was performed in preparation for ROM, and facilitation of active movement.  Therapeutic Exercise:  Pt. worked on left shoulder stabilization exercises in supine with the shoulder flexed to 90 degrees, and the elbow in extension. Pt. was able toinitiate active shoulder stabilization responses minimally with support provided proximally.Assist was also provided at the elbow to assist in maintaining elbow in extension.Pt. was able to perform scapular protraction with support provided at the elbow, and forearm. Pt.worked on hand to face patterns with support at the elbow, and hand with combinations of movement. Pt. was able  to perform elbow extension exercise against gravity.Pt. Worked on [employer] from a seated position for shoulder flexion, and external rotation with support at the left elbow.Pt.worked on wrist, and digit extension with facilitation, andalternating weightbearing. Pt. worked on active supination with support at the forearm.   Pt. started to practice driving woth her husband this past weekend. Pt. drove herself, and her 21 year old son to therapy today. Pt.continues to makesteady progress. Increased consistent active wrist extension, and digit extension. Pt. Was able to hold wrist extension during the initial reps. Pt. is able to consistently initiate active left shoulder horizontal abduction, elbow flexion, and extension, forearm supination, wrist extension, digit extension with facilitation, and thumb abduction. Pt. continues to work on improving shoulder stabilization, wrist, and digit movements for hand to face pattens, and to increase LUE engagement during ADLs, and IADL tasks.  OT Education - 11/10/19 1958    Education Details  Left UE  functioning    Person(s) Educated  Patient;Spouse    Methods  Demonstration    Comprehension  Returned demonstration;Verbalized understanding          OT Long Term Goals - 10/29/19 1726      OT LONG TERM GOAL #1   Title  Pt. will increase left shoulder flexion PROM by 20 degrees to assist with UE dressing    Baseline  Pt. continues to present with limited left shoulder PROM    Time  12    Period  Weeks    Status  On-going    Target Date  12/01/19      OT LONG TERM GOAL #2   Title  Pt. will be able to actively stabilize left shoulder for 10 seconds in supine with shoulde flexed to 90 degrees, and elbow in extension in preparation for functional reaching.    Baseline  Pt. conitnues to be unable to stabilize the left shoulder, although pt. is activating intermittent muscle responses.    Time  12     Period  Weeks    Status  On-going    Target Date  12/01/19      OT LONG TERM GOAL #3   Title  Pt. will independently perform hand to face patterns with the LUE in preparation for light self grooming tasks.    Baseline  Eval: Pt. is able to initiate performing hand to face patterns with the LUE however is unable to consitently reach, and susutain her hand at her face..    Time  12    Period  Weeks    Status  On-going    Target Date  12/01/19      OT LONG TERM GOAL #4   Title  Pt. will initiate 10 degrees of active left wrist extension  in preparation for actively lifting her hand off of a surface.    Baseline  Pt. is inconsistently able to activate wrist extensors with facilitation.    Time  12    Period  Weeks    Status  On-going    Target Date  12/01/19      OT LONG TERM GOAL #5   Title  Pt. will initiate left hand digit extension in order to be able to actively release an objects during ADL tasks.    Baseline  Pt. is able to initiate relaxing flexor grip consistently when holding objects.,    Time  12    Period  Weeks    Status  On-going    Target Date  12/01/19      OT LONG TERM GOAL #6   Title  Pt. increase left hand digit flexion in order to be able to hold her toothbrush while applying toothpaste with her left right hand.    Baseline  Pt. is initiating activating gross digit flexors.    Time  12    Period  Weeks    Status  On-going    Target Date  12/01/19            Plan - 11/10/19 1959    Clinical Impression Statement Pt. started to practice driving woth her husband this past weekend. Pt. drove herself, and her 31 year old son to therapy today. Pt.continues to makesteady progress. Increased consistent active wrist extension, and digit extension. Pt. Was able to hold wrist extension during the initial reps. Pt. is able to consistently initiate active  left shoulder horizontal abduction, elbow flexion, and extension, forearm supination, wrist extension, digit  extension with facilitation, and thumb abduction. Pt. continues to work on improving shoulder stabilization, wrist, and digit movements for hand to face pattens, and to increase LUE engagement during ADLs, and IADL tasks.   OT Occupational Profile and History  Detailed Assessment- Review of Records and additional review of physical, cognitive, psychosocial history related to current functional performance    Occupational performance deficits (Please refer to evaluation for details):  ADL's;IADL's    Rehab Potential  Good    Clinical Decision Making  Several treatment options, min-mod task modification necessary    Comorbidities Affecting Occupational Performance:  May have comorbidities impacting occupational performance    Modification or Assistance to Complete Evaluation   Min-Moderate modification of tasks or assist with assess necessary to complete eval    OT Frequency  2x / week    OT Duration  12 weeks    OT Treatment/Interventions  Self-care/ADL training;Moist Heat;DME and/or AE instruction;Neuromuscular education;Therapeutic exercise;Therapeutic activities;Energy conservation;Patient/family education    Consulted and Agree with Plan of Care  Patient       Patient will benefit from skilled therapeutic intervention in order to improve the following deficits and impairments:           Visit Diagnosis: Muscle weakness (generalized)  Other lack of coordination    Problem List Patient Active Problem List   Diagnosis Date Noted  . E. coli UTI   . Diabetes mellitus type 2 in obese ([place])   . Chronic pain of right knee   . 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 Hemiparesis affecting left side as late effect of stroke ([place])   . Acute ischemic right middle cerebral artery (MCA) stroke ([place]) 07/22/2019  . Carotid artery dissection  (HCC) s/p stent placement 07/21/2019  . Diabetes mellitus type II, uncontrolled ([place]) 07/21/2019  . Acute blood loss anemia 07/21/2019  .  Leukocytosis 07/21/2019  . Stroke (cerebrum) ([place]) - R MCA infarct s/p tenecteplase and mechanical thrombectomy w/ d/t ICA dissection  07/15/2019  . Middle cerebral artery embolism, right 07/15/2019  . Controlled type 2 diabetes mellitus without complication, without long-term current use of insulin ([place]) 05/28/2017  . Attention deficit hyperactivity disorder (ADHD), combined type 04/18/2017  . Morbid obesity ([place]) 01/25/2016  . Umbilical hernia [id number]  . Hiatal hernia 12/08/2015  . History of Clostridium difficile colitis 06/19/2014  . HTN (hypertension) 12/23/2013  . PCOS (polycystic ovarian syndrome) 12/23/2013  . Bariatric surgery status 01/28/2013    Harrel Carina, MS, OTR/L 11/10/2019, 8:01 PM  [place] MAIN [facility] SERVICES [address] [place], [place], [address] Phone: [phone]   Fax:  [phone]  Name: Kathryn Coffey MRN: [id number] Date of Birth: July 14, 1971

## 2019-11-12 ENCOUNTER — Ambulatory Visit: Payer: No Typology Code available for payment source | Admitting: Occupational Therapy

## 2019-11-12 ENCOUNTER — Encounter: Payer: Self-pay | Admitting: Occupational Therapy

## 2019-11-12 ENCOUNTER — Other Ambulatory Visit: Payer: Self-pay

## 2019-11-12 ENCOUNTER — Ambulatory Visit: Payer: No Typology Code available for payment source

## 2019-11-12 DIAGNOSIS — M6281 Muscle weakness (generalized): Secondary | ICD-10-CM

## 2019-11-12 DIAGNOSIS — R262 Difficulty in walking, not elsewhere classified: Secondary | ICD-10-CM

## 2019-11-12 NOTE — Therapy (Signed)
[place] MAIN [facility] SERVICES [address] [place], [place], [address] Phone: [phone]   Fax:  [phone]  Physical Therapy Treatment  Patient Details  Name: Kathryn Coffey MRN: [id number] Date of Birth: 1971/02/26 No data recorded  Encounter Date: 11/12/2019  PT End of Session - 11/12/19 1312    Visit Number  11    Number of Visits  49    Date for PT Re-Evaluation  01/26/20    PT Start Time  6962    PT Stop Time  1345    PT Time Calculation (min)  40 min    Equipment Utilized During Treatment  Gait belt    Activity Tolerance  Patient tolerated treatment well    Behavior During Therapy  WFL for tasks assessed/performed       Past Medical History:  Diagnosis Date  . Abdominal wall cellulitis 2013 and 2014  . Asthma    allergic to grasses  . B12 deficiency   . Complication of anesthesia   . Costochondritis   . Diabetes mellitus without complication ([place])   . Gastric anomaly    multiple small ulcers  . GERD (gastroesophageal reflux disease)   . Herpes 06/2018   POSSIBLY IN EYE- PT TAKING VALTREX AND WILL SEE OPTHAMOLOGIST TO SEE IF VALTREX IS WORKING  . History of abnormal cervical Pap smear   . History of Clostridium difficile colitis   . History of hiatal hernia   . History of kidney stones   . Hypertension   . IBS (irritable bowel syndrome)   . Migraine   . Morbid obesity ([place])    s/p attempted gastric banding now decompressed  . PCOS (polycystic ovarian syndrome)   . PONV (postoperative nausea and vomiting)    WITH SPINAL ONLY  . Stroke [facility])     Past Surgical History:  Procedure Laterality Date  . BREAST EXCISIONAL BIOPSY Left    cyst excision - Dr. Patty Sermons  . CESAREAN SECTION    . CHOLECYSTECTOMY    . COLONOSCOPY    . EAR CYST EXCISION Right 07/04/2018   Procedure: EXCISION GLOMUS TUMOR THUMBNAIL;  Surgeon: Corky Mull, MD;  Location: [facility] ORS;  Service: Orthopedics;  Laterality: Right;  .  ESOPHAGOGASTRODUODENOSCOPY    . ESOPHAGOGASTRODUODENOSCOPY (EGD) WITH PROPOFOL N/A 12/06/2015   Procedure: ESOPHAGOGASTRODUODENOSCOPY (EGD) WITH PROPOFOL;  Surgeon: Manya Silvas, MD;  Location: [facility] ENDOSCOPY;  Service: Endoscopy;  Laterality: N/A;  . IR ANGIO INTRA EXTRACRAN SEL INTERNAL CAROTID UNI L MOD SED  07/15/2019  . IR ANGIO VERTEBRAL SEL SUBCLAVIAN INNOMINATE UNI R MOD SED  07/15/2019  . IR CT HEAD LTD  07/15/2019  . IR INTRAVSC STENT CERV CAROTID W/O EMB-PROT MOD SED INC ANGIO  07/15/2019  . IR PERCUTANEOUS ART THROMBECTOMY/INFUSION INTRACRANIAL INC DIAG ANGIO  07/15/2019  . LAPAROSCOPIC GASTRIC BANDING    . LAPAROSCOPIC GASTRIC RESTRICTIVE DUODENAL PROCEDURE (DUODENAL SWITCH) N/A 01/25/2016   Procedure: LAPAROSCOPIC GASTRIC RESTRICTIVE DUODENAL PROCEDURE (DUODENAL SWITCH);  Surgeon: Ladora Daniel, MD;  Location: [facility] ORS;  Service: General;  Laterality: N/A;  . OVARY SURGERY     x2  . RADIOLOGY WITH ANESTHESIA N/A 07/15/2019   Procedure: IR WITH ANESTHESIA;  Surgeon: Luanne Bras, MD;  Location: [place];  Service: Radiology;  Laterality: N/A;  . TONSILLECTOMY    . TUBAL LIGATION    . UMBILICAL HERNIA REPAIR N/A 01/25/2016   Procedure: LAPAROSCOPIC UMBILICAL HERNIA;  Surgeon: Ladora Daniel, MD;  Location: [facility] ORS;  Service: General;  Laterality: N/A;  . UMBILICAL HERNIA REPAIR N/A 10/20/2016   Procedure: HERNIA REPAIR UMBILICAL ADULT;  Surgeon: Leonie Green, MD;  Location: [facility] ORS;  Service: General;  Laterality: N/A;    There were no vitals filed for this visit.  Subjective Assessment - 11/12/19 1304    Subjective  Patient reports she is doing well today. No resting pain reported upon arrival. No falls since last session. Compliant with HEP. She continues to have minimal function out of LUE but no resting pain at this time. No specific questions or concerns currently.    Pertinent History  Patient was in the hospital  and then in patient rehab 3 weeks.  She was dicharged 08/13/19 and then HHPT and was discharged 09/03/19. She has not had any falls.    Currently in Pain?  No/denies         TREATMENT   Ther-ex  Supine L SLR 2 x 15; Hooklying bridges  staggered foot position and RLE on 6" step 2 x 15, stabilization required at L knee; Hooklying SAQ over bolster with manual resistance and therapist encouraging end range extension strength 2 x 15; Supine L D1 flexion/extension with manual resistance from therapist x 15; R sidelying L hip straight leg abduction 2 x 15; Standing L knee TKE with red tband resistance x 10; Seated L HS curls with green tband 2 x 15; Precor LLE single leg press 40# x 10, x 7; 6" alternating step taps without UE support x 10 bilateral; 6" forward step-ups alternating leading LE x 10 on each side, no UE support when leading up with RLE and down with LLE, single UE support when leading up with LLE and down with RLE. Pt demonstrates buckling with initial L knee unlocking during step-down so practiced unlocking prior to initiating step down;   Pt educated throughout session about proper posture and technique with exercises. Improved exercise technique, movement at target joints, use of target muscles after min to mod verbal, visual, tactile cues.   Pt demonstrates excellent motivation during session. She is able to complete supine and standing exercises with therapist. She demonstrates L quad weakness with L knee hyperextension in standing. During step-ups she requires single RUE support when leading with LLE.  Pt also demonstrates buckling with initial L knee unlocking during step-down so practiced unlocking prior to initiating step down. Performed L single leg press today with considerable weakness and fatigue noted. Pt encouraged to continue HEP. Pt will benefit from PT services to address deficits in strength, balance, and mobility in order to return to full function at home.                          PT Short Term Goals - 11/03/19 1356      PT SHORT TERM GOAL #1   Title  Patient will be independent in home exercise program to improve strength/mobility for better functional independence with ADLs.    Baseline  11/03/19: Pt performing HEP at least 5d/wk    Time  6    Period  Weeks    Status  Partially Met    Target Date  12/15/19      PT SHORT TERM GOAL #2   Title  Patient (> 26 years old) will complete five times sit to stand test in < 15 seconds indicating an increased LE strength and improved balance.    Baseline  eval: 15.28s; 11/03/19: 12.2s  Time  6    Period  Weeks    Status  Achieved    Target Date  10/20/19        PT Long Term Goals - 11/03/19 1359      PT LONG TERM GOAL #1   Title  Patient will increase Berg Balance score by > 6 points to demonstrate decreased fall risk during functional activities.    Baseline  09/08/19: 48/56; 11/03/19: 51/56    Time  12    Period  Weeks    Status  Partially Met    Target Date  01/26/20      PT LONG TERM GOAL #2   Title  Patient will increase six minute walk test distance to >1200 for progression to community ambulator and improve gait ability    Baseline  09/08/19: 525', 11/03/19: 817';    Time  12    Period  Weeks    Status  Partially Met    Target Date  01/26/20      PT LONG TERM GOAL #3   Title  Patient will increase 10 meter walk test to >1.70ms as to improve gait speed for better community ambulation and to reduce fall risk    Baseline  09/08/19:  0.51 m/s; 11/03/19: self-selected: 13.2s = 0.76 m/s, fastest: 12.5s = 0.8 m/s    Time  12    Period  Weeks    Status  Partially Met    Target Date  01/26/20            Plan - 11/12/19 1316    Clinical Impression Statement  Pt demonstrates excellent motivation during session. She is able to complete supine and standing exercises with therapist. She demonstrates L quad weakness with L knee hyperextension in standing. During  step-ups she requires single RUE support when leading with LLE.  Pt also demonstrates buckling with initial L knee unlocking during step-down so practiced unlocking prior to initiating step down. Performed L single leg press today with considerable weakness and fatigue noted. Pt encouraged to continue HEP. Pt will benefit from PT services to address deficits in strength, balance, and mobility in order to return to full function at home.    Personal Factors and Comorbidities  Comorbidity 1    Comorbidities  diabetes, high blood pressure, reflux,    Examination-Activity Limitations  Carry;Caring for Others;Bend;Locomotion Level;Self Feeding;Sleep;Toileting    Examination-Participation Restrictions  Cleaning;Community Activity;Driving;Laundry;Shop    Stability/Clinical Decision Making  Stable/Uncomplicated    Rehab Potential  Good    PT Frequency  2x / week    PT Duration  12 weeks    PT Treatment/Interventions  Manual techniques;Functional mobility training;Stair training;Gait training;Electrical Stimulation;Moist Heat;Ultrasound;Therapeutic exercise;Balance training;Neuromuscular re-education    PT Next Visit Plan  strengthening and balance    PT Home Exercise Plan  standing hip abd,    Consulted and Agree with Plan of Care  Patient       Patient will benefit from skilled therapeutic intervention in order to improve the following deficits and impairments:  Abnormal gait, Decreased knowledge of use of DME, Dizziness, Pain, Decreased coordination, Impaired UE functional use, Decreased strength, Decreased endurance, Decreased activity tolerance, Decreased balance, Difficulty walking, Obesity  Visit Diagnosis: Muscle weakness (generalized)  Difficulty in walking, not elsewhere classified     Problem List Patient Active Problem List   Diagnosis Date Noted  . E. coli UTI   . Diabetes mellitus type 2 in obese ([place]   . Chronic pain of right knee   .  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 Hemiparesis affecting left side as late effect of stroke ([place])   . Acute ischemic right middle cerebral artery (MCA) stroke ([place]) 07/22/2019  . Carotid artery dissection  (HCC) s/p stent placement 07/21/2019  . Diabetes mellitus type II, uncontrolled ([place]) 07/21/2019  . Acute blood loss anemia 07/21/2019  . Leukocytosis 07/21/2019  . Stroke (cerebrum) ([place]) - R MCA infarct s/p tenecteplase and mechanical thrombectomy w/ d/t ICA dissection  07/15/2019  . Middle cerebral artery embolism, right 07/15/2019  . Controlled type 2 diabetes mellitus without complication, without long-term current use of insulin ([place]) 05/28/2017  . Attention deficit hyperactivity disorder (ADHD), combined type 04/18/2017  . Morbid obesity ([place]) 01/25/2016  . Umbilical hernia 16/24/4695  . Hiatal hernia 12/08/2015  . History of Clostridium difficile colitis 06/19/2014  . HTN (hypertension) 12/23/2013  . PCOS (polycystic ovarian syndrome) 12/23/2013  . Bariatric surgery status 01/28/2013   Phillips Grout PT, DPT, GCS  Kathryn Coffey 11/12/2019, 1:54 PM  [place] MAIN [facility] SERVICES [address] [place], [place], [address] Phone: [phone]   Fax:  [phone]  Name: Kathryn Coffey MRN: [id number] Date of Birth: [birth year]

## 2019-11-13 NOTE — Therapy (Addendum)
[place] MAIN [facility] SERVICES [address] [place], [place], [address] Phone: [phone]   Fax:  [phone]  Occupational Therapy Treatment  Patient Details  Name: Kathryn Coffey MRN: [id number] Date of Birth: 1970/11/21 Referring Provider (OT): Raulkar   Encounter Date: 11/12/2019  OT End of Session - 11/12/19 1351    Visit Number  14   Number of Visits  24    Date for OT Re-Evaluation  12/01/19    Authorization Type  Progress report period starting  on 09/08/2019    OT Start Time  1348    OT Stop Time  1430    OT Time Calculation (min)  42 min    Activity Tolerance  Patient tolerated treatment well    Behavior During Therapy  [facility] for tasks assessed/performed       Past Medical History:  Diagnosis Date  . Abdominal wall cellulitis 2013 and 2014  . Asthma    allergic to grasses  . B12 deficiency   . Complication of anesthesia   . Costochondritis   . Diabetes mellitus without complication ([place])   . Gastric anomaly    multiple small ulcers  . GERD (gastroesophageal reflux disease)   . Herpes 06/2018   POSSIBLY IN EYE- PT TAKING VALTREX AND WILL SEE OPTHAMOLOGIST TO SEE IF VALTREX IS WORKING  . History of abnormal cervical Pap smear   . History of Clostridium difficile colitis   . History of hiatal hernia   . History of kidney stones   . Hypertension   . IBS (irritable bowel syndrome)   . Migraine   . Morbid obesity ([place])    s/p attempted gastric banding now decompressed  . PCOS (polycystic ovarian syndrome)   . PONV (postoperative nausea and vomiting)    WITH SPINAL ONLY  . Stroke [facility])     Past Surgical History:  Procedure Laterality Date  . BREAST EXCISIONAL BIOPSY Left    cyst excision - Dr. Patty Sermons  . CESAREAN SECTION    . CHOLECYSTECTOMY    . COLONOSCOPY    . EAR CYST EXCISION Right 07/04/2018   Procedure: EXCISION GLOMUS TUMOR THUMBNAIL;  Surgeon: Corky Mull, MD;  Location: [facility] ORS;  Service:  Orthopedics;  Laterality: Right;  . ESOPHAGOGASTRODUODENOSCOPY    . ESOPHAGOGASTRODUODENOSCOPY (EGD) WITH PROPOFOL N/A 12/06/2015   Procedure: ESOPHAGOGASTRODUODENOSCOPY (EGD) WITH PROPOFOL;  Surgeon: Manya Silvas, MD;  Location: [facility] ENDOSCOPY;  Service: Endoscopy;  Laterality: N/A;  . IR ANGIO INTRA EXTRACRAN SEL INTERNAL CAROTID UNI L MOD SED  07/15/2019  . IR ANGIO VERTEBRAL SEL SUBCLAVIAN INNOMINATE UNI R MOD SED  07/15/2019  . IR CT HEAD LTD  07/15/2019  . IR INTRAVSC STENT CERV CAROTID W/O EMB-PROT MOD SED INC ANGIO  07/15/2019  . IR PERCUTANEOUS ART THROMBECTOMY/INFUSION INTRACRANIAL INC DIAG ANGIO  07/15/2019  . LAPAROSCOPIC GASTRIC BANDING    . LAPAROSCOPIC GASTRIC RESTRICTIVE DUODENAL PROCEDURE (DUODENAL SWITCH) N/A 01/25/2016   Procedure: LAPAROSCOPIC GASTRIC RESTRICTIVE DUODENAL PROCEDURE (DUODENAL SWITCH);  Surgeon: Ladora Daniel, MD;  Location: [facility] ORS;  Service: General;  Laterality: N/A;  . OVARY SURGERY     x2  . RADIOLOGY WITH ANESTHESIA N/A 07/15/2019   Procedure: IR WITH ANESTHESIA;  Surgeon: Luanne Bras, MD;  Location: [place];  Service: Radiology;  Laterality: N/A;  . TONSILLECTOMY    . TUBAL LIGATION    . UMBILICAL HERNIA REPAIR N/A 01/25/2016   Procedure: LAPAROSCOPIC UMBILICAL HERNIA;  Surgeon: Ladora Daniel, MD;  Location: [facility] ORS;  Service: General;  Laterality: N/A;  . UMBILICAL HERNIA REPAIR N/A 10/20/2016   Procedure: HERNIA REPAIR UMBILICAL ADULT;  Surgeon: Leonie Green, MD;  Location: [facility] ORS;  Service: General;  Laterality: N/A;    There were no vitals filed for this visit.  Subjective Assessment - 11/12/19 1348    Subjective   Pt. reports that she plans to return home.    Patient is accompanied by:  Family member    Pertinent History  Per pt. chart. Pt. is a 49 y.o. female with history of HTN, T2DM, obesity who was admitted on 07/15/19 with onset of HA progressing to flaccid left hemiparesis a few hours later.  CT head showed acute  nonhemorrhagic infarct in right basal ganglia with hyperdense right MCA sign.  CT A/P head neck showed perfusion deficit with occluded right ICA and slow flow in M1.  She underwent cerebral angiogram with revascularization of right MCA with T1C1 revascularization and repair of right ICA with flow diverter device. Stroke felt to be embolic likely due to ICA dissection. Postprocedure head CT was negative for bleed repeat CTA head neck showed reocclusion of right ICA origin and occlusion of right ICA stent but now with patent right MCA. Pt. received inpatient rehab services for 3 weeks, and home health services.    Currently in Pain?  No/denies       OT Treatment  Neuro muscular re-education:  Pt. tolerated weightbearing, and proprioceptive awareness through the LUE, and handwhile seated at the mat. Pt.continues to require assist to position the LUE into position for weightbearing. Pt. also worked on alternating weightbearing with functional reaching, and graspingfor cones, and placing them onto an elevatedflat tabletopsurface with cues, support, and assist for the LUE. Emphasis was placed on placing the the cones on the table, and actively relaxing grip on the cone in preparation for extending herhand off of the cones.Weightbearing, and proprioceptive input was performed in preparation for ROM, and facilitation of active movement.  Therapeutic Exercise:  Pt. worked on left shoulder stabilization exercises in supine with the shoulder flexed to 90 degrees, and the elbow in extension. Pt. was able toinitiate active shoulder stabilization responses minimally with support provided proximally.Assist was also provided at the elbow to assist in maintaining elbow in extension.Pt. was able to perform scapular protraction with support provided at the elbow, and forearm. Pt.worked on hand to face patterns with support at the elbow, and hand with combinations of movement. Pt. was able to perform elbow  extension exercise against gravity.Pt. Worked on [employer]. Pt.worked on wrist, and digit extension with facilitation, andalternating weightbearing. Pt. worked on active supination with support at the forearm.   Pt.continues to makesteady progress. Increased consistent active wrist extension, and 2nd digit extension. Pt. Is able to hold wrist extension during the initial reps. Pt. is able to consistently initiate active left shoulder horizontal abduction, elbow flexion, and extension, forearm supination, wrist extension, digit extension with facilitation, and thumb abduction.Pt. continues to work on improving shoulder stabilization, wrist, and digit movements for hand to face pattens, and to increase LUE engagement during ADLs, and IADL tasks.                      OT Education - 11/12/19 1350    Education Details  Left UE  functioning    Person(s) Educated  Patient;Spouse    Methods  Demonstration    Comprehension  Returned demonstration;Verbalized understanding  OT Long Term Goals - 10/29/19 1726      OT LONG TERM GOAL #1   Title  Pt. will increase left shoulder flexion PROM by 20 degrees to assist with UE dressing    Baseline  Pt. continues to present with limited left shoulder PROM    Time  12    Period  Weeks    Status  On-going    Target Date  12/01/19      OT LONG TERM GOAL #2   Title  Pt. will be able to actively stabilize left shoulder for 10 seconds in supine with shoulde flexed to 90 degrees, and elbow in extension in preparation for functional reaching.    Baseline  Pt. conitnues to be unable to stabilize the left shoulder, although pt. is activating intermittent muscle responses.    Time  12    Period  Weeks    Status  On-going    Target Date  12/01/19      OT LONG TERM GOAL #3   Title  Pt. will independently perform hand to face patterns with the LUE in preparation for light self grooming tasks.    Baseline  Eval: Pt. is able to  initiate performing hand to face patterns with the LUE however is unable to consitently reach, and susutain her hand at her face..    Time  12    Period  Weeks    Status  On-going    Target Date  12/01/19      OT LONG TERM GOAL #4   Title  Pt. will initiate 10 degrees of active left wrist extension  in preparation for actively lifting her hand off of a surface.    Baseline  Pt. is inconsistently able to activate wrist extensors with facilitation.    Time  12    Period  Weeks    Status  On-going    Target Date  12/01/19      OT LONG TERM GOAL #5   Title  Pt. will initiate left hand digit extension in order to be able to actively release an objects during ADL tasks.    Baseline  Pt. is able to initiate relaxing flexor grip consistently when holding objects.,    Time  12    Period  Weeks    Status  On-going    Target Date  12/01/19      OT LONG TERM GOAL #6   Title  Pt. increase left hand digit flexion in order to be able to hold her toothbrush while applying toothpaste with her left right hand.    Baseline  Pt. is initiating activating gross digit flexors.    Time  12    Period  Weeks    Status  On-going    Target Date  12/01/19            Plan - 11/12/19 1351    Clinical Impression Statement Pt.continues to makesteady progress. Increased consistent active wrist extension, and 2nd digit extension. Pt. Is able to hold wrist extension during the initial reps. Pt. is able to consistently initiate active left shoulder horizontal abduction, elbow flexion, and extension, forearm supination, wrist extension, digit extension with facilitation, and thumb abduction.Pt. continues to work on improving shoulder stabilization, wrist, and digit movements for hand to face pattens, and to increase LUE engagement during ADLs, and IADL tasks.   OT Occupational Profile and History  Detailed Assessment- Review of Records and additional review of physical, cognitive, psychosocial history related  to  current functional performance    Occupational performance deficits (Please refer to evaluation for details):  ADL's;IADL's    Rehab Potential  Good    Clinical Decision Making  Several treatment options, min-mod task modification necessary    Comorbidities Affecting Occupational Performance:  May have comorbidities impacting occupational performance    Modification or Assistance to Complete Evaluation   Min-Moderate modification of tasks or assist with assess necessary to complete eval    OT Frequency  2x / week    OT Duration  12 weeks    OT Treatment/Interventions  Self-care/ADL training;Moist Heat;DME and/or AE instruction;Neuromuscular education;Therapeutic exercise;Therapeutic activities;Energy conservation;Patient/family education    Consulted and Agree with Plan of Care  Patient       Patient will benefit from skilled therapeutic intervention in order to improve the following deficits and impairments:           Visit Diagnosis: Muscle weakness (generalized)    Problem List Patient Active Problem List   Diagnosis Date Noted  . E. coli UTI   . Diabetes mellitus type 2 in obese ([place])   . Chronic pain of right knee   . 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 Hemiparesis affecting left side as late effect of stroke ([place])   . Acute ischemic right middle cerebral artery (MCA) stroke ([place]) 07/22/2019  . Carotid artery dissection  ([facility]) s/p stent placement 07/21/2019  . Diabetes mellitus type II, uncontrolled ([place]) 07/21/2019  . Acute blood loss anemia 07/21/2019  . Leukocytosis 07/21/2019  . Stroke (cerebrum) ([place]) - R MCA infarct s/p tenecteplase and mechanical thrombectomy w/ d/t ICA dissection  07/15/2019  . Middle cerebral artery embolism, right 07/15/2019  . Controlled type 2 diabetes mellitus without complication, without long-term current use of insulin ([place]) 05/28/2017  . Attention deficit hyperactivity disorder (ADHD), combined type 04/18/2017  . Morbid  obesity ([place]) 01/25/2016  . Umbilical hernia [id number]  . Hiatal hernia 12/08/2015  . History of Clostridium difficile colitis 06/19/2014  . HTN (hypertension) 12/23/2013  . PCOS (polycystic ovarian syndrome) 12/23/2013  . Bariatric surgery status 01/28/2013    Harrel Carina, MS, OTR/L 11/13/2019, 9:55 AM  [place] MAIN [facility] SERVICES [address] [place], [place], [address] Phone: [phone]   Fax:  [phone]  Name: ZOI SALO MRN: [id number] Date of Birth: 26-Aug-1970

## 2019-11-17 ENCOUNTER — Other Ambulatory Visit: Payer: Self-pay

## 2019-11-17 ENCOUNTER — Ambulatory Visit: Payer: No Typology Code available for payment source | Admitting: Occupational Therapy

## 2019-11-17 ENCOUNTER — Encounter: Payer: Self-pay | Admitting: Occupational Therapy

## 2019-11-17 DIAGNOSIS — M6281 Muscle weakness (generalized): Secondary | ICD-10-CM | POA: Diagnosis not present

## 2019-11-17 DIAGNOSIS — R278 Other lack of coordination: Secondary | ICD-10-CM

## 2019-11-17 NOTE — Therapy (Signed)
[place] MAIN [facility] SERVICES [address] [place], [place], [address] Phone: [phone]   Fax:  [phone]  Occupational Therapy Treatment  Patient Details  Name: Kathryn Coffey MRN: [id number] Date of Birth: November 02, 1970 Referring Provider (OT): Raulkar   Encounter Date: 11/17/2019  OT End of Session - 11/17/19 2250    Visit Number  15    Number of Visits  24    Date for OT Re-Evaluation  12/01/19    Authorization Type  Progress report period starting  on 09/08/2019    Authorization Time Period  FOTO: 46    OT Start Time  1523    OT Stop Time  1600    OT Time Calculation (min)  37 min    Activity Tolerance  Patient tolerated treatment well    Behavior During Therapy  [facility] for tasks assessed/performed       Past Medical History:  Diagnosis Date  . Abdominal wall cellulitis 2013 and 2014  . Asthma    allergic to grasses  . B12 deficiency   . Complication of anesthesia   . Costochondritis   . Diabetes mellitus without complication ([place])   . Gastric anomaly    multiple small ulcers  . GERD (gastroesophageal reflux disease)   . Herpes 06/2018   POSSIBLY IN EYE- PT TAKING VALTREX AND WILL SEE OPTHAMOLOGIST TO SEE IF VALTREX IS WORKING  . History of abnormal cervical Pap smear   . History of Clostridium difficile colitis   . History of hiatal hernia   . History of kidney stones   . Hypertension   . IBS (irritable bowel syndrome)   . Migraine   . Morbid obesity ([place])    s/p attempted gastric banding now decompressed  . PCOS (polycystic ovarian syndrome)   . PONV (postoperative nausea and vomiting)    WITH SPINAL ONLY  . Stroke [facility])     Past Surgical History:  Procedure Laterality Date  . BREAST EXCISIONAL BIOPSY Left    cyst excision - Dr. Patty Sermons  . CESAREAN SECTION    . CHOLECYSTECTOMY    . COLONOSCOPY    . EAR CYST EXCISION Right 07/04/2018   Procedure: EXCISION GLOMUS TUMOR THUMBNAIL;  Surgeon: Corky Mull, MD;   Location: [facility] ORS;  Service: Orthopedics;  Laterality: Right;  . ESOPHAGOGASTRODUODENOSCOPY    . ESOPHAGOGASTRODUODENOSCOPY (EGD) WITH PROPOFOL N/A 12/06/2015   Procedure: ESOPHAGOGASTRODUODENOSCOPY (EGD) WITH PROPOFOL;  Surgeon: Manya Silvas, MD;  Location: [facility] ENDOSCOPY;  Service: Endoscopy;  Laterality: N/A;  . IR ANGIO INTRA EXTRACRAN SEL INTERNAL CAROTID UNI L MOD SED  07/15/2019  . IR ANGIO VERTEBRAL SEL SUBCLAVIAN INNOMINATE UNI R MOD SED  07/15/2019  . IR CT HEAD LTD  07/15/2019  . IR INTRAVSC STENT CERV CAROTID W/O EMB-PROT MOD SED INC ANGIO  07/15/2019  . IR PERCUTANEOUS ART THROMBECTOMY/INFUSION INTRACRANIAL INC DIAG ANGIO  07/15/2019  . LAPAROSCOPIC GASTRIC BANDING    . LAPAROSCOPIC GASTRIC RESTRICTIVE DUODENAL PROCEDURE (DUODENAL SWITCH) N/A 01/25/2016   Procedure: LAPAROSCOPIC GASTRIC RESTRICTIVE DUODENAL PROCEDURE (DUODENAL SWITCH);  Surgeon: Ladora Daniel, MD;  Location: [facility] ORS;  Service: General;  Laterality: N/A;  . OVARY SURGERY     x2  . RADIOLOGY WITH ANESTHESIA N/A 07/15/2019   Procedure: IR WITH ANESTHESIA;  Surgeon: Luanne Bras, MD;  Location: [place];  Service: Radiology;  Laterality: N/A;  . TONSILLECTOMY    . TUBAL LIGATION    . UMBILICAL HERNIA REPAIR N/A 01/25/2016  Procedure: LAPAROSCOPIC UMBILICAL HERNIA;  Surgeon: Ladora Daniel, MD;  Location: [facility] ORS;  Service: General;  Laterality: N/A;  . UMBILICAL HERNIA REPAIR N/A 10/20/2016   Procedure: HERNIA REPAIR UMBILICAL ADULT;  Surgeon: Leonie Green, MD;  Location: [facility] ORS;  Service: General;  Laterality: N/A;    There were no vitals filed for this visit.  Subjective Assessment - 11/17/19 2249    Subjective   Pt. reports that she plans to return home.    Patient is accompanied by:  Family member    Pertinent History  Per pt. chart. Pt. is a 49 y.o. female with history of HTN, T2DM, obesity who was admitted on 07/15/19 with onset of HA progressing to flaccid left hemiparesis a few hours  later.  CT head showed acute nonhemorrhagic infarct in right basal ganglia with hyperdense right MCA sign.  CT A/P head neck showed perfusion deficit with occluded right ICA and slow flow in M1.  She underwent cerebral angiogram with revascularization of right MCA with T1C1 revascularization and repair of right ICA with flow diverter device. Stroke felt to be embolic likely due to ICA dissection. Postprocedure head CT was negative for bleed repeat CTA head neck showed reocclusion of right ICA origin and occlusion of right ICA stent but now with patent right MCA. Pt. received inpatient rehab services for 3 weeks, and home health services.    Currently in Pain?  No/denies         OT Treatment  Neuro muscular re-education:  Pt.tolerated weightbearing, and proprioceptive awareness through the LUE, and handwhile seated at the mat. Pt.continues to requireassist to position the LUE into position for weightbearing. Pt. also worked on alternating weightbearing with functional reaching, and graspingfor cones, and placing them onto an elevatedflat tabletopsurface with cues, support, and assist for the LUE. Emphasis was placed on placing the the cones on the table, and actively relaxing grip on the cone in preparation for extending herhand off of the cones.Weightbearing, and proprioceptive input was performed in preparation for ROM, and facilitation of active movement.  Therapeutic Exercise:  Pt. worked on left shoulder stabilization exercises in supine with the shoulder flexed to 90 degrees, and the elbow in extension. Pt. was able toinitiate active shoulder stabilization responses minimally with support provided proximally.Assist was also provided at the elbow to assist in maintaining elbow in extension.Pt. was able to perform scapular protraction with support provided at the elbow, and forearm. Pt.worked on hand to face patterns with support at the elbow, and handwith combinations of  movement.Pt. was able to perform elbow extension exercise against gravity.Pt. Worked on [employer]. Pt.worked on wrist, and digit extension with facilitation, andalternating weightbearing. Pt. worked on active supination with support at the forearm.   Pt.continues to makesteady progress.Increased consistent active wrist extension, and 2nd digit extension, 3rd digit, and 5th digit extension. Pt. is able to consistently initiate active left shoulder horizontal abduction, elbow flexion, and extension, forearm supination, wrist extension, digit extension with facilitation, and thumb abduction.Pt. continues to require work on improvingshoulder stabilization, wrist, and digitmovements for hand to face pattens, and to increase LUE engagement during ADLs, and IADL tasks.                       OT Education - 11/17/19 2250    Education Details  Left UE  functioning    Person(s) Educated  Patient;Spouse    Methods  Demonstration    Comprehension  Returned demonstration;Verbalized understanding;Verbal cues required  OT Long Term Goals - 10/29/19 1726      OT LONG TERM GOAL #1   Title  Pt. will increase left shoulder flexion PROM by 20 degrees to assist with UE dressing    Baseline  Pt. continues to present with limited left shoulder PROM    Time  12    Period  Weeks    Status  On-going    Target Date  12/01/19      OT LONG TERM GOAL #2   Title  Pt. will be able to actively stabilize left shoulder for 10 seconds in supine with shoulde flexed to 90 degrees, and elbow in extension in preparation for functional reaching.    Baseline  Pt. conitnues to be unable to stabilize the left shoulder, although pt. is activating intermittent muscle responses.    Time  12    Period  Weeks    Status  On-going    Target Date  12/01/19      OT LONG TERM GOAL #3   Title  Pt. will independently perform hand to face patterns with the LUE in preparation for light self grooming  tasks.    Baseline  Eval: Pt. is able to initiate performing hand to face patterns with the LUE however is unable to consitently reach, and susutain her hand at her face..    Time  12    Period  Weeks    Status  On-going    Target Date  12/01/19      OT LONG TERM GOAL #4   Title  Pt. will initiate 10 degrees of active left wrist extension  in preparation for actively lifting her hand off of a surface.    Baseline  Pt. is inconsistently able to activate wrist extensors with facilitation.    Time  12    Period  Weeks    Status  On-going    Target Date  12/01/19      OT LONG TERM GOAL #5   Title  Pt. will initiate left hand digit extension in order to be able to actively release an objects during ADL tasks.    Baseline  Pt. is able to initiate relaxing flexor grip consistently when holding objects.,    Time  12    Period  Weeks    Status  On-going    Target Date  12/01/19      OT LONG TERM GOAL #6   Title  Pt. increase left hand digit flexion in order to be able to hold her toothbrush while applying toothpaste with her left right hand.    Baseline  Pt. is initiating activating gross digit flexors.    Time  12    Period  Weeks    Status  On-going    Target Date  12/01/19            Plan - 11/17/19 2253    Clinical Impression Statement  Pt.continues to makesteady progress.Increased consistent active wrist extension, and 2nd digit extension, 3rd digit, and 5th digit extension. Pt. is able to consistently initiate active left shoulder horizontal abduction, elbow flexion, and extension, forearm supination, wrist extension, digit extension with facilitation, and thumb abduction.Pt. continues to require work on improvingshoulder stabilization, wrist, and digitmovements for hand to face pattens, and to increase LUE engagement during ADLs, and IADL tasks.   OT Occupational Profile and History  Detailed Assessment- Review of Records and additional review of physical, cognitive,  psychosocial history related to current functional performance  Occupational performance deficits (Please refer to evaluation for details):  ADL's;IADL's    Rehab Potential  Good    Clinical Decision Making  Several treatment options, min-mod task modification necessary    Comorbidities Affecting Occupational Performance:  May have comorbidities impacting occupational performance    Modification or Assistance to Complete Evaluation   Min-Moderate modification of tasks or assist with assess necessary to complete eval    OT Frequency  2x / week    OT Duration  12 weeks    OT Treatment/Interventions  Self-care/ADL training;Moist Heat;DME and/or AE instruction;Neuromuscular education;Therapeutic exercise;Therapeutic activities;Energy conservation;Patient/family education    Consulted and Agree with Plan of Care  Patient       Patient will benefit from skilled therapeutic intervention in order to improve the following deficits and impairments:           Visit Diagnosis: Muscle weakness (generalized)  Other lack of coordination    Problem List Patient Active Problem List   Diagnosis Date Noted  . E. coli UTI   . Diabetes mellitus type 2 in obese ([place])   . Chronic pain of right knee   . 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 Hemiparesis affecting left side as late effect of stroke ([place])   . Acute ischemic right middle cerebral artery (MCA) stroke ([place]) 07/22/2019  . Carotid artery dissection  (H[facility]) s/p stent placement 07/21/2019  . Diabetes mellitus type II, uncontrolled ([place]) 07/21/2019  . Acute blood loss anemia 07/21/2019  . Leukocytosis 07/21/2019  . Stroke (cerebrum) ([place]) - R MCA infarct s/p tenecteplase and mechanical thrombectomy w/ d/t ICA dissection  07/15/2019  . Middle cerebral artery embolism, right 07/15/2019  . Controlled type 2 diabetes mellitus without complication, without long-term current use of insulin ([place]) 05/28/2017  . Attention deficit  hyperactivity disorder (ADHD), combined type 04/18/2017  . Morbid obesity ([place]) 01/25/2016  . Umbilical hernia [id number]  . Hiatal hernia 12/08/2015  . History of Clostridium difficile colitis 06/19/2014  . HTN (hypertension) 12/23/2013  . PCOS (polycystic ovarian syndrome) 12/23/2013  . Bariatric surgery status 01/28/2013    Harrel Carina, MS, OTR/L 11/17/2019, 10:55 PM  [place] MAIN [facility] SERVICES [address] [place], [place], [address] Phone: [phone]   Fax:  [phone]  Name: ROSELY KALLA MRN: [id number] Date of Birth: 03-Nov-1970

## 2019-11-19 ENCOUNTER — Encounter: Payer: Self-pay | Admitting: Occupational Therapy

## 2019-11-19 ENCOUNTER — Other Ambulatory Visit: Payer: Self-pay

## 2019-11-19 ENCOUNTER — Ambulatory Visit: Payer: No Typology Code available for payment source | Admitting: Occupational Therapy

## 2019-11-19 ENCOUNTER — Ambulatory Visit: Payer: No Typology Code available for payment source

## 2019-11-19 DIAGNOSIS — R262 Difficulty in walking, not elsewhere classified: Secondary | ICD-10-CM

## 2019-11-19 DIAGNOSIS — M6281 Muscle weakness (generalized): Secondary | ICD-10-CM

## 2019-11-19 NOTE — Therapy (Signed)
[place] MAIN [facility] SERVICES [address] [place], [place], [address] Phone: [phone]   Fax:  [phone]  Occupational Therapy Treatment  Patient Details  Name: Kathryn Coffey MRN: [id number] Date of Birth: 01-Mar-1971 Referring Provider (OT): Raulkar   Encounter Date: 11/19/2019  OT End of Session - 11/19/19 1350    Visit Number  16    Number of Visits  24    Date for OT Re-Evaluation  12/01/19    Authorization Type  Progress report period starting  on 09/08/2019    Authorization Time Period  FOTO: 14    OT Start Time  1350    OT Stop Time  1430    OT Time Calculation (min)  40 min    Activity Tolerance  Patient tolerated treatment well    Behavior During Therapy  [facility] for tasks assessed/performed       Past Medical History:  Diagnosis Date  . Abdominal wall cellulitis 2013 and 2014  . Asthma    allergic to grasses  . B12 deficiency   . Complication of anesthesia   . Costochondritis   . Diabetes mellitus without complication ([place])   . Gastric anomaly    multiple small ulcers  . GERD (gastroesophageal reflux disease)   . Herpes 06/2018   POSSIBLY IN EYE- PT TAKING VALTREX AND WILL SEE OPTHAMOLOGIST TO SEE IF VALTREX IS WORKING  . History of abnormal cervical Pap smear   . History of Clostridium difficile colitis   . History of hiatal hernia   . History of kidney stones   . Hypertension   . IBS (irritable bowel syndrome)   . Migraine   . Morbid obesity ([place])    s/p attempted gastric banding now decompressed  . PCOS (polycystic ovarian syndrome)   . PONV (postoperative nausea and vomiting)    WITH SPINAL ONLY  . Stroke [facility])     Past Surgical History:  Procedure Laterality Date  . BREAST EXCISIONAL BIOPSY Left    cyst excision - Dr. Patty Sermons  . CESAREAN SECTION    . CHOLECYSTECTOMY    . COLONOSCOPY    . EAR CYST EXCISION Right 07/04/2018   Procedure: EXCISION GLOMUS TUMOR THUMBNAIL;  Surgeon: Corky Mull, MD;   Location: [facility] ORS;  Service: Orthopedics;  Laterality: Right;  . ESOPHAGOGASTRODUODENOSCOPY    . ESOPHAGOGASTRODUODENOSCOPY (EGD) WITH PROPOFOL N/A 12/06/2015   Procedure: ESOPHAGOGASTRODUODENOSCOPY (EGD) WITH PROPOFOL;  Surgeon: Manya Silvas, MD;  Location: [facility] ENDOSCOPY;  Service: Endoscopy;  Laterality: N/A;  . IR ANGIO INTRA EXTRACRAN SEL INTERNAL CAROTID UNI L MOD SED  07/15/2019  . IR ANGIO VERTEBRAL SEL SUBCLAVIAN INNOMINATE UNI R MOD SED  07/15/2019  . IR CT HEAD LTD  07/15/2019  . IR INTRAVSC STENT CERV CAROTID W/O EMB-PROT MOD SED INC ANGIO  07/15/2019  . IR PERCUTANEOUS ART THROMBECTOMY/INFUSION INTRACRANIAL INC DIAG ANGIO  07/15/2019  . LAPAROSCOPIC GASTRIC BANDING    . LAPAROSCOPIC GASTRIC RESTRICTIVE DUODENAL PROCEDURE (DUODENAL SWITCH) N/A 01/25/2016   Procedure: LAPAROSCOPIC GASTRIC RESTRICTIVE DUODENAL PROCEDURE (DUODENAL SWITCH);  Surgeon: Ladora Daniel, MD;  Location: [facility] ORS;  Service: General;  Laterality: N/A;  . OVARY SURGERY     x2  . RADIOLOGY WITH ANESTHESIA N/A 07/15/2019   Procedure: IR WITH ANESTHESIA;  Surgeon: Luanne Bras, MD;  Location: [place];  Service: Radiology;  Laterality: N/A;  . TONSILLECTOMY    . TUBAL LIGATION    . UMBILICAL HERNIA REPAIR N/A 01/25/2016  Procedure: LAPAROSCOPIC UMBILICAL HERNIA;  Surgeon: Ladora Daniel, MD;  Location: [facility] ORS;  Service: General;  Laterality: N/A;  . UMBILICAL HERNIA REPAIR N/A 10/20/2016   Procedure: HERNIA REPAIR UMBILICAL ADULT;  Surgeon: Leonie Green, MD;  Location: [facility] ORS;  Service: General;  Laterality: N/A;    There were no vitals filed for this visit.  Subjective Assessment - 11/19/19 1349    Subjective   Pt. reports that she is doing well today,  No pain.    Patient is accompanied by:  Family member    Pertinent History  Per pt. chart. Pt. is a 49 y.o. female with history of HTN, T2DM, obesity who was admitted on 07/15/19 with onset of HA progressing to flaccid left hemiparesis a few  hours later.  CT head showed acute nonhemorrhagic infarct in right basal ganglia with hyperdense right MCA sign.  CT A/P head neck showed perfusion deficit with occluded right ICA and slow flow in M1.  She underwent cerebral angiogram with revascularization of right MCA with T1C1 revascularization and repair of right ICA with flow diverter device. Stroke felt to be embolic likely due to ICA dissection. Postprocedure head CT was negative for bleed repeat CTA head neck showed reocclusion of right ICA origin and occlusion of right ICA stent but now with patent right MCA. Pt. received inpatient rehab services for 3 weeks, and home health services.    Currently in Pain?  No/denies      OT Treatment  Neuro muscular re-education:  Pt.tolerated weightbearing, and proprioceptive awareness through the LUE, and handwhile seated at the mat. Pt.continues to requireassist to position the LUE into position for weightbearing. Pt. also worked on alternating weightbearing with functional reaching, and graspingfor cones, and placing them onto an elevatedflat tabletopsurface with cues, support, and assist for the LUE. Emphasis was placed on placing the the cones on the table, and actively relaxing grip on the cone in preparation for extending herhand off of the cones.Weightbearing, and proprioceptive input was performed in preparation for ROM, and facilitation of active movement.  Therapeutic Exercise:  Pt. worked on left shoulder stabilization exercises in supine with the shoulder flexed to 90 degrees, and the elbow in extension. Pt. was able toinitiate active shoulder stabilization responses minimally with support provided proximally.Assist was also provided at the elbow to assist in maintaining elbow in extension.Pt. was able to perform scapular protraction with support provided at the elbow, and forearm. Pt.worked on hand to face patterns with support at the elbow, and handwith combinations of  movement.Pt. was able to perform elbow extension exercise against gravity. Pt. Worked on formulating diagonal patterns with assist form supine.Pt. Worked on [employer] while seated at the tabletop.Pt.worked on wrist, and digit extension with facilitation, andalternating weightbearing. Pt. worked on active supination with support at the forearm.   Pt. Reports that she has not heard back yet about returning to work. Pt. Reports that she has her one handed keyboard. Pt.continues to makesteady progress.Pt. Was able to activate increased wrist extension, as well as activate digit extension with facilitation, and alternating weightbearing. Pt. However, was unable to initiate digit extension from the tabletop surface today.  Pt. is able to consistently initiate active left shoulder horizontal abduction, elbow flexion, and extension, forearm supination, wrist extension, digit extension with facilitation, and thumb abduction.Pt. continues to require work on improvingshoulder stabilization, wrist, and digitmovements for hand to face pattens, and to increase LUE engagement during ADLs, and IADL tasks.  OT Education - 11/19/19 1350    Education Details  Left UE  functioning    Person(s) Educated  Patient;Spouse    Methods  Demonstration    Comprehension  Returned demonstration;Verbalized understanding;Verbal cues required          OT Long Term Goals - 10/29/19 1726      OT LONG TERM GOAL #1   Title  Pt. will increase left shoulder flexion PROM by 20 degrees to assist with UE dressing    Baseline  Pt. continues to present with limited left shoulder PROM    Time  12    Period  Weeks    Status  On-going    Target Date  12/01/19      OT LONG TERM GOAL #2   Title  Pt. will be able to actively stabilize left shoulder for 10 seconds in supine with shoulde flexed to 90 degrees, and elbow in extension in preparation for functional reaching.    Baseline  Pt.  conitnues to be unable to stabilize the left shoulder, although pt. is activating intermittent muscle responses.    Time  12    Period  Weeks    Status  On-going    Target Date  12/01/19      OT LONG TERM GOAL #3   Title  Pt. will independently perform hand to face patterns with the LUE in preparation for light self grooming tasks.    Baseline  Eval: Pt. is able to initiate performing hand to face patterns with the LUE however is unable to consitently reach, and susutain her hand at her face..    Time  12    Period  Weeks    Status  On-going    Target Date  12/01/19      OT LONG TERM GOAL #4   Title  Pt. will initiate 10 degrees of active left wrist extension  in preparation for actively lifting her hand off of a surface.    Baseline  Pt. is inconsistently able to activate wrist extensors with facilitation.    Time  12    Period  Weeks    Status  On-going    Target Date  12/01/19      OT LONG TERM GOAL #5   Title  Pt. will initiate left hand digit extension in order to be able to actively release an objects during ADL tasks.    Baseline  Pt. is able to initiate relaxing flexor grip consistently when holding objects.,    Time  12    Period  Weeks    Status  On-going    Target Date  12/01/19      OT LONG TERM GOAL #6   Title  Pt. increase left hand digit flexion in order to be able to hold her toothbrush while applying toothpaste with her left right hand.    Baseline  Pt. is initiating activating gross digit flexors.    Time  12    Period  Weeks    Status  On-going    Target Date  12/01/19            Plan - 11/19/19 1351    Clinical Impression Statement  Pt. Reports that she has not heard back yet about returning to work. Pt. Reports that she has her one handed keyboard. Pt.continues to makesteady progress.Pt. Was able to activate increased wrist extension, as well as activate digit extension with facilitation, and alternating weightbearing. Pt. However, was unable to  initiate digit extension from the tabletop surface today.  Pt. is able to consistently initiate active left shoulder horizontal abduction, elbow flexion, and extension, forearm supination, wrist extension, digit extension with facilitation, and thumb abduction.Pt. continues to require work on improvingshoulder stabilization, wrist, and digitmovements for hand to face pattens, and to increase LUE engagement during ADLs, and IADL tasks.   OT Occupational Profile and History  Detailed Assessment- Review of Records and additional review of physical, cognitive, psychosocial history related to current functional performance    Occupational performance deficits (Please refer to evaluation for details):  ADL's;IADL's    Rehab Potential  Good    Clinical Decision Making  Several treatment options, min-mod task modification necessary    Comorbidities Affecting Occupational Performance:  May have comorbidities impacting occupational performance    Modification or Assistance to Complete Evaluation   Min-Moderate modification of tasks or assist with assess necessary to complete eval    OT Frequency  2x / week    OT Duration  12 weeks    OT Treatment/Interventions  Self-care/ADL training;Moist Heat;DME and/or AE instruction;Neuromuscular education;Therapeutic exercise;Therapeutic activities;Energy conservation;Patient/family education    Consulted and Agree with Plan of Care  Patient       Patient will benefit from skilled therapeutic intervention in order to improve the following deficits and impairments:           Visit Diagnosis: Muscle weakness (generalized)    Problem List Patient Active Problem List   Diagnosis Date Noted  . E. coli UTI   . Diabetes mellitus type 2 in obese ([place])   . Chronic pain of right knee   . 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 Hemiparesis affecting left side as late effect of stroke ([place])   . Acute ischemic right middle cerebral artery (MCA) stroke ([place])  07/22/2019  . Carotid artery dissection  (HCC) s/p stent placement 07/21/2019  . Diabetes mellitus type II, uncontrolled ([place]) 07/21/2019  . Acute blood loss anemia 07/21/2019  . Leukocytosis 07/21/2019  . Stroke (cerebrum) ([place]) - R MCA infarct s/p tenecteplase and mechanical thrombectomy w/ d/t ICA dissection  07/15/2019  . Middle cerebral artery embolism, right 07/15/2019  . Controlled type 2 diabetes mellitus without complication, without long-term current use of insulin ([place]) 05/28/2017  . Attention deficit hyperactivity disorder (ADHD), combined type 04/18/2017  . Morbid obesity ([place]) 01/25/2016  . Umbilical hernia [id number]  . Hiatal hernia 12/08/2015  . History of Clostridium difficile colitis 06/19/2014  . HTN (hypertension) 12/23/2013  . PCOS (polycystic ovarian syndrome) 12/23/2013  . Bariatric surgery status 01/28/2013    Harrel Carina, MS, OTR/L 11/19/2019, 1:52 PM  [place] MAIN [facility] SERVICES [address] [place], [place], [address] Phone: [phone]   Fax:  [phone]  Name: Kathryn Coffey MRN: [id number] Date of Birth: [birth year]

## 2019-11-19 NOTE — Therapy (Signed)
[place] MAIN [facility] SERVICES [address] [place], [place], [address] Phone: [phone]   Fax:  [phone]  Physical Therapy Treatment  Patient Details  Name: Kathryn Coffey MRN: [id number] Date of Birth: Feb 03, 1971 No data recorded  Encounter Date: 11/19/2019  PT End of Session - 11/19/19 1311    Visit Number  12    Number of Visits  49    Date for PT Re-Evaluation  01/26/20    PT Start Time  6962    PT Stop Time  1345    PT Time Calculation (min)  40 min    Equipment Utilized During Treatment  Gait belt    Activity Tolerance  Patient tolerated treatment well    Behavior During Therapy  WFL for tasks assessed/performed       Past Medical History:  Diagnosis Date  . Abdominal wall cellulitis 2013 and 2014  . Asthma    allergic to grasses  . B12 deficiency   . Complication of anesthesia   . Costochondritis   . Diabetes mellitus without complication ([place])   . Gastric anomaly    multiple small ulcers  . GERD (gastroesophageal reflux disease)   . Herpes 06/2018   POSSIBLY IN EYE- PT TAKING VALTREX AND WILL SEE OPTHAMOLOGIST TO SEE IF VALTREX IS WORKING  . History of abnormal cervical Pap smear   . History of Clostridium difficile colitis   . History of hiatal hernia   . History of kidney stones   . Hypertension   . IBS (irritable bowel syndrome)   . Migraine   . Morbid obesity ([place])    s/p attempted gastric banding now decompressed  . PCOS (polycystic ovarian syndrome)   . PONV (postoperative nausea and vomiting)    WITH SPINAL ONLY  . Stroke [facility])     Past Surgical History:  Procedure Laterality Date  . BREAST EXCISIONAL BIOPSY Left    cyst excision - Dr. Patty Sermons  . CESAREAN SECTION    . CHOLECYSTECTOMY    . COLONOSCOPY    . EAR CYST EXCISION Right 07/04/2018   Procedure: EXCISION GLOMUS TUMOR THUMBNAIL;  Surgeon: Corky Mull, MD;  Location: [facility] ORS;  Service: Orthopedics;  Laterality: Right;  .  ESOPHAGOGASTRODUODENOSCOPY    . ESOPHAGOGASTRODUODENOSCOPY (EGD) WITH PROPOFOL N/A 12/06/2015   Procedure: ESOPHAGOGASTRODUODENOSCOPY (EGD) WITH PROPOFOL;  Surgeon: Manya Silvas, MD;  Location: [facility] ENDOSCOPY;  Service: Endoscopy;  Laterality: N/A;  . IR ANGIO INTRA EXTRACRAN SEL INTERNAL CAROTID UNI L MOD SED  07/15/2019  . IR ANGIO VERTEBRAL SEL SUBCLAVIAN INNOMINATE UNI R MOD SED  07/15/2019  . IR CT HEAD LTD  07/15/2019  . IR INTRAVSC STENT CERV CAROTID W/O EMB-PROT MOD SED INC ANGIO  07/15/2019  . IR PERCUTANEOUS ART THROMBECTOMY/INFUSION INTRACRANIAL INC DIAG ANGIO  07/15/2019  . LAPAROSCOPIC GASTRIC BANDING    . LAPAROSCOPIC GASTRIC RESTRICTIVE DUODENAL PROCEDURE (DUODENAL SWITCH) N/A 01/25/2016   Procedure: LAPAROSCOPIC GASTRIC RESTRICTIVE DUODENAL PROCEDURE (DUODENAL SWITCH);  Surgeon: Ladora Daniel, MD;  Location: [facility] ORS;  Service: General;  Laterality: N/A;  . OVARY SURGERY     x2  . RADIOLOGY WITH ANESTHESIA N/A 07/15/2019   Procedure: IR WITH ANESTHESIA;  Surgeon: Luanne Bras, MD;  Location: [place];  Service: Radiology;  Laterality: N/A;  . TONSILLECTOMY    . TUBAL LIGATION    . UMBILICAL HERNIA REPAIR N/A 01/25/2016   Procedure: LAPAROSCOPIC UMBILICAL HERNIA;  Surgeon: Ladora Daniel, MD;  Location: [facility] ORS;  Service: General;  Laterality: N/A;  . UMBILICAL HERNIA REPAIR N/A 10/20/2016   Procedure: HERNIA REPAIR UMBILICAL ADULT;  Surgeon: Leonie Green, MD;  Location: [facility] ORS;  Service: General;  Laterality: N/A;    There were no vitals filed for this visit.  Subjective Assessment - 11/19/19 1309    Subjective  Patient reports she is doing well today. No resting pain reported upon arrival. No falls since last session. Compliant with HEP. She continues to have minimal function out of LUE but no resting pain at this time. No specific questions or concerns currently.    Pertinent History  Patient was in the hospital  and then in patient rehab 3 weeks.  She was dicharged 08/13/19 and then HHPT and was discharged 09/03/19. She has not had any falls.    Currently in Pain?  No/denies         TREATMENT   Ther-ex Supine L SLR with 3# ankle weights 2 x 15; Hooklying clams with manual resistance 2 x 15; Hooklying adductor squeezes with manual resistance 2 x 15; Hooklying L single leg bridge with bolster under L knee and RLE elevated 2 x 15; Supine L D1 flexion/extension with manual resistance from therapist x 15; R sidelying L hip straight leg abduction 2 x 15; Seated L LAQ with 3# ankle weight 2 x 15; Precor LLE single leg press 40# x 15, x 10, assist required to initiate first rep; Precor L heel raises 25# x 15;   Pt educated throughout session about proper posture and technique with exercises. Improved exercise technique, movement at target joints, use of target muscles after min to mod verbal, visual, tactile cues.   Pt demonstrates excellent motivation during session today. She is able to increase her resistance on the leg press today as well as add ankle weights to SLR and sidelying hip abduction. She continues to demonstrate some end range L quad weakness during seated LAQ. Pt encouraged to continue HEP. Pt will benefit from PT services to address deficits in strength, balance, and mobility in order to return to full function at home.                        PT Short Term Goals - 11/03/19 1356      PT SHORT TERM GOAL #1   Title  Patient will be independent in home exercise program to improve strength/mobility for better functional independence with ADLs.    Baseline  11/03/19: Pt performing HEP at least 5d/wk    Time  6    Period  Weeks    Status  Partially Met    Target Date  12/15/19      PT SHORT TERM GOAL #2   Title  Patient (> 34 years old) will complete five times sit to stand test in < 15 seconds indicating an increased LE strength and improved balance.    Baseline  eval: 15.28s; 11/03/19:  12.2s    Time  6    Period  Weeks    Status  Achieved    Target Date  10/20/19        PT Long Term Goals - 11/03/19 1359      PT LONG TERM GOAL #1   Title  Patient will increase Berg Balance score by > 6 points to demonstrate decreased fall risk during functional activities.    Baseline  09/08/19: 48/56; 11/03/19: 51/56    Time  12    Period  Weeks    Status  Partially Met    Target Date  01/26/20      PT LONG TERM GOAL #2   Title  Patient will increase six minute walk test distance to >1200 for progression to community ambulator and improve gait ability    Baseline  09/08/19: 525', 11/03/19: 817';    Time  12    Period  Weeks    Status  Partially Met    Target Date  01/26/20      PT LONG TERM GOAL #3   Title  Patient will increase 10 meter walk test to >1.10ms as to improve gait speed for better community ambulation and to reduce fall risk    Baseline  09/08/19:  0.51 m/s; 11/03/19: self-selected: 13.2s = 0.76 m/s, fastest: 12.5s = 0.8 m/s    Time  12    Period  Weeks    Status  Partially Met    Target Date  01/26/20            Plan - 11/19/19 1311    Clinical Impression Statement  Pt demonstrates excellent motivation during session today. She is able to increase her resistance on the leg press today as well as add ankle weights to SLR and sidelying hip abduction. She continues to demonstrate some end range L quad strength during seated LAQ. Pt encouraged to continue HEP. Pt will benefit from PT services to address deficits in strength, balance, and mobility in order to return to full function at home.    Personal Factors and Comorbidities  Comorbidity 1    Comorbidities  diabetes, high blood pressure, reflux,    Examination-Activity Limitations  Carry;Caring for Others;Bend;Locomotion Level;Self Feeding;Sleep;Toileting    Examination-Participation Restrictions  Cleaning;Community Activity;Driving;Laundry;Shop    Stability/Clinical Decision Making  Stable/Uncomplicated     Rehab Potential  Good    PT Frequency  2x / week    PT Duration  12 weeks    PT Treatment/Interventions  Manual techniques;Functional mobility training;Stair training;Gait training;Electrical Stimulation;Moist Heat;Ultrasound;Therapeutic exercise;Balance training;Neuromuscular re-education    PT Next Visit Plan  strengthening and balance    PT Home Exercise Plan  standing hip abd,    Consulted and Agree with Plan of Care  Patient       Patient will benefit from skilled therapeutic intervention in order to improve the following deficits and impairments:  Abnormal gait, Decreased knowledge of use of DME, Dizziness, Pain, Decreased coordination, Impaired UE functional use, Decreased strength, Decreased endurance, Decreased activity tolerance, Decreased balance, Difficulty walking, Obesity  Visit Diagnosis: Muscle weakness (generalized)  Difficulty in walking, not elsewhere classified     Problem List Patient Active Problem List   Diagnosis Date Noted  . E. coli UTI   . Diabetes mellitus type 2 in obese ([place]   . Chronic pain of right knee   . Flaccid monoplegia of upper extremity ([place]   . Hemiparesis affecting left side as late effect of stroke ([place]   . Acute ischemic right middle cerebral artery (MCA) stroke ([place] 07/22/2019  . Carotid artery dissection  (HCC) s/p stent placement 07/21/2019  . Diabetes mellitus type II, uncontrolled ([place] 07/21/2019  . Acute blood loss anemia 07/21/2019  . Leukocytosis 07/21/2019  . Stroke (cerebrum) ([place] - R MCA infarct s/p tenecteplase and mechanical thrombectomy w/ d/t ICA dissection  07/15/2019  . Middle cerebral artery embolism, right 07/15/2019  . Controlled type 2 diabetes mellitus without complication, without long-term current use of insulin ([place] 05/28/2017  . Attention deficit  hyperactivity disorder (ADHD), combined type 04/18/2017  . Morbid obesity ([place]) 01/25/2016  . Umbilical hernia 97/09/6376  . Hiatal hernia 12/08/2015  .  History of Clostridium difficile colitis 06/19/2014  . HTN (hypertension) 12/23/2013  . PCOS (polycystic ovarian syndrome) 12/23/2013  . Bariatric surgery status 01/28/2013   Phillips Grout PT, DPT, GCS  Robel Wuertz 11/19/2019, 2:47 PM  [place] MAIN [facility] SERVICES [address] [place], [place], [address] Phone: [phone]   Fax:  [phone]  Name: Kathryn Coffey MRN: [id number] Date of Birth: April 03, 1971

## 2019-11-24 ENCOUNTER — Ambulatory Visit: Payer: No Typology Code available for payment source | Attending: Physical Medicine and Rehabilitation

## 2019-11-24 DIAGNOSIS — R262 Difficulty in walking, not elsewhere classified: Secondary | ICD-10-CM | POA: Insufficient documentation

## 2019-11-24 DIAGNOSIS — R2689 Other abnormalities of gait and mobility: Secondary | ICD-10-CM | POA: Insufficient documentation

## 2019-11-24 DIAGNOSIS — M6281 Muscle weakness (generalized): Secondary | ICD-10-CM | POA: Diagnosis not present

## 2019-11-24 DIAGNOSIS — R278 Other lack of coordination: Secondary | ICD-10-CM | POA: Insufficient documentation

## 2019-11-24 NOTE — Therapy (Signed)
[place] MAIN [facility] SERVICES [address] [place], [place], [address] Phone: [phone]   Fax:  [phone]  Physical Therapy Treatment  Patient Details  Name: Kathryn Coffey MRN: [id number] Date of Birth: 10/10/1970 No data recorded  Encounter Date: 11/24/2019  PT End of Session - 11/25/19 1232    Visit Number  13    Number of Visits  49    Date for PT Re-Evaluation  01/26/20    PT Start Time  4665    PT Stop Time  1600    PT Time Calculation (min)  45 min    Equipment Utilized During Treatment  Gait belt    Activity Tolerance  Patient tolerated treatment well    Behavior During Therapy  WFL for tasks assessed/performed       Past Medical History:  Diagnosis Date  . Abdominal wall cellulitis 2013 and 2014  . Asthma    allergic to grasses  . B12 deficiency   . Complication of anesthesia   . Costochondritis   . Diabetes mellitus without complication ([place])   . Gastric anomaly    multiple small ulcers  . GERD (gastroesophageal reflux disease)   . Herpes 06/2018   POSSIBLY IN EYE- PT TAKING VALTREX AND WILL SEE OPTHAMOLOGIST TO SEE IF VALTREX IS WORKING  . History of abnormal cervical Pap smear   . History of Clostridium difficile colitis   . History of hiatal hernia   . History of kidney stones   . Hypertension   . IBS (irritable bowel syndrome)   . Migraine   . Morbid obesity ([place])    s/p attempted gastric banding now decompressed  . PCOS (polycystic ovarian syndrome)   . PONV (postoperative nausea and vomiting)    WITH SPINAL ONLY  . Stroke [facility])     Past Surgical History:  Procedure Laterality Date  . BREAST EXCISIONAL BIOPSY Left    cyst excision - Dr. Patty Sermons  . CESAREAN SECTION    . CHOLECYSTECTOMY    . COLONOSCOPY    . EAR CYST EXCISION Right 07/04/2018   Procedure: EXCISION GLOMUS TUMOR THUMBNAIL;  Surgeon: Corky Mull, MD;  Location: [facility] ORS;  Service: Orthopedics;  Laterality: Right;  .  ESOPHAGOGASTRODUODENOSCOPY    . ESOPHAGOGASTRODUODENOSCOPY (EGD) WITH PROPOFOL N/A 12/06/2015   Procedure: ESOPHAGOGASTRODUODENOSCOPY (EGD) WITH PROPOFOL;  Surgeon: Manya Silvas, MD;  Location: [facility] ENDOSCOPY;  Service: Endoscopy;  Laterality: N/A;  . IR ANGIO INTRA EXTRACRAN SEL INTERNAL CAROTID UNI L MOD SED  07/15/2019  . IR ANGIO VERTEBRAL SEL SUBCLAVIAN INNOMINATE UNI R MOD SED  07/15/2019  . IR CT HEAD LTD  07/15/2019  . IR INTRAVSC STENT CERV CAROTID W/O EMB-PROT MOD SED INC ANGIO  07/15/2019  . IR PERCUTANEOUS ART THROMBECTOMY/INFUSION INTRACRANIAL INC DIAG ANGIO  07/15/2019  . LAPAROSCOPIC GASTRIC BANDING    . LAPAROSCOPIC GASTRIC RESTRICTIVE DUODENAL PROCEDURE (DUODENAL SWITCH) N/A 01/25/2016   Procedure: LAPAROSCOPIC GASTRIC RESTRICTIVE DUODENAL PROCEDURE (DUODENAL SWITCH);  Surgeon: Ladora Daniel, MD;  Location: [facility] ORS;  Service: General;  Laterality: N/A;  . OVARY SURGERY     x2  . RADIOLOGY WITH ANESTHESIA N/A 07/15/2019   Procedure: IR WITH ANESTHESIA;  Surgeon: Luanne Bras, MD;  Location: [place];  Service: Radiology;  Laterality: N/A;  . TONSILLECTOMY    . TUBAL LIGATION    . UMBILICAL HERNIA REPAIR N/A 01/25/2016   Procedure: LAPAROSCOPIC UMBILICAL HERNIA;  Surgeon: Ladora Daniel, MD;  Location: [facility] ORS;  Service: General;  Laterality: N/A;  . UMBILICAL HERNIA REPAIR N/A 10/20/2016   Procedure: HERNIA REPAIR UMBILICAL ADULT;  Surgeon: Leonie Green, MD;  Location: [facility] ORS;  Service: General;  Laterality: N/A;    There were no vitals filed for this visit.  Subjective Assessment - 11/24/19 1520    Subjective  Patient reports she is doing well today. No resting pain reported upon arrival. She was putting a blender under the counter and while she was squatting she heard her L knee pop and she rolled over onto her side on the floor. She tried to get up from the floor but didn't have anything to hold onto. Her son helped her up off the floor. She denies any  injury or pain from the fall. Compliant with HEP. She continues to have minimal function out of LUE but no resting pain at this time. No specific questions or concerns currently.    Pertinent History  Patient was in the hospital [place] and then in patient rehab 3 weeks. She was dicharged 08/13/19 and then HHPT and was discharged 09/03/19. She has not had any falls.    Currently in Pain?  No/denies            TREATMENT   Ther-ex Supine L SLRwith 4# ankle weights (AW) 2x 20; Hooklying clams with green tband 2 x 20; Gait in rehab gym with single point cane and swedish knee cage on LLE practicing not hyperextending L knee and working on step length and L single leg stability; Hooklying adductor squeezes with manual resistance 2 x 20; Hooklying L single leg bridge with bolster under L knee and RLE elevated 2 x 15; R sidelying L hip straight leg abduction with 4# AW 2 x 20; Precor LLE single leg press 40# x 20, 55# x 6 no assist today required to initiate first rep during first set at 40#, minA+1 for last 2 reps of second set; Precor L heel raises 25# x 15; Wall squats with pball 10s hold, 10s rest x 4, no more performed due to increase in R knee pain 2/2 chronic torn meniscus;   Pt educated throughout session about proper posture and technique with exercises. Improved exercise technique, movement at target joints, use of target muscles after min to mod verbal, visual, tactile cues.   Pt demonstrates excellent motivation during session today. She is able to increase her resistance on the leg press today and increase ankle weights with SLR and sidelying hip abduction. Her gait looks much better wearing knee brace with no L knee hyperextension. Time taken away from session today to allow Richardson Landry from [place] to fit pt into her new L knee brace.  Pt encouraged to continue HEP. Pt will benefit from PT services to address deficits in strength, balance, and mobility in order to return to  full function at home.                        PT Short Term Goals - 11/03/19 1356      PT SHORT TERM GOAL #1   Title  Patient will be independent in home exercise program to improve strength/mobility for better functional independence with ADLs.    Baseline  11/03/19: Pt performing HEP at least 5d/wk    Time  6    Period  Weeks    Status  Partially Met    Target Date  12/15/19      PT SHORT TERM GOAL #2  Title  Patient (> 77 years old) will complete five times sit to stand test in < 15 seconds indicating an increased LE strength and improved balance.    Baseline  eval: 15.28s; 11/03/19: 12.2s    Time  6    Period  Weeks    Status  Achieved    Target Date  10/20/19        PT Long Term Goals - 11/03/19 1359      PT LONG TERM GOAL #1   Title  Patient will increase Berg Balance score by > 6 points to demonstrate decreased fall risk during functional activities.    Baseline  09/08/19: 48/56; 11/03/19: 51/56    Time  12    Period  Weeks    Status  Partially Met    Target Date  01/26/20      PT LONG TERM GOAL #2   Title  Patient will increase six minute walk test distance to >1200 for progression to community ambulator and improve gait ability    Baseline  09/08/19: 525', 11/03/19: 817';    Time  12    Period  Weeks    Status  Partially Met    Target Date  01/26/20      PT LONG TERM GOAL #3   Title  Patient will increase 10 meter walk test to >1.21ms as to improve gait speed for better community ambulation and to reduce fall risk    Baseline  09/08/19:  0.51 m/s; 11/03/19: self-selected: 13.2s = 0.76 m/s, fastest: 12.5s = 0.8 m/s    Time  12    Period  Weeks    Status  Partially Met    Target Date  01/26/20            Plan - 11/25/19 1232    Clinical Impression Statement  Pt demonstrates excellent motivation during session today. She is able to increase her resistance on the leg press today and increase ankle weights with SLR and sidelying hip  abduction. Her gait looks much better wearing knee brace with no L knee hyperextension. Time taken away from session today to [place] L knee brace.  Pt encouraged to continue HEP. Pt will benefit from PT services to address deficits in strength, balance, and mobility in order to return to full function at home.    Personal Factors and Comorbidities  Comorbidity 1    Comorbidities  diabetes, high blood pressure, reflux,    Examination-Activity Limitations  Carry;Caring for Others;Bend;Locomotion Level;Self Feeding;Sleep;Toileting    Examination-Participation Restrictions  Cleaning;Community Activity;Driving;Laundry;Shop    Stability/Clinical Decision Making  Stable/Uncomplicated    Rehab Potential  Good    PT Frequency  2x / week    PT Duration  12 weeks    PT Treatment/Interventions  Manual techniques;Functional mobility training;Stair training;Gait training;Electrical Stimulation;Moist Heat;Ultrasound;Therapeutic exercise;Balance training;Neuromuscular re-education    PT Next Visit Plan  strengthening and balance    PT Home Exercise Plan  standing hip abd,    Consulted and Agree with Plan of Care  Patient       Patient will benefit from skilled therapeutic intervention in order to improve the following deficits and impairments:  Abnormal gait, Decreased knowledge of use of DME, Dizziness, Pain, Decreased coordination, Impaired UE functional use, Decreased strength, Decreased endurance, Decreased activity tolerance, Decreased balance, Difficulty walking, Obesity  Visit Diagnosis: Muscle weakness (generalized)  Difficulty in walking, not elsewhere classified     Problem List Patient  Active Problem List   Diagnosis Date Noted  . E. coli UTI   . Diabetes mellitus type 2 in obese ([place])   . Chronic pain of right knee   . 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 Hemiparesis affecting left side as late effect of stroke ([place])   . Acute ischemic  right middle cerebral artery (MCA) stroke ([place]) 07/22/2019  . Carotid artery dissection  (HCC) s/p stent placement 07/21/2019  . Diabetes mellitus type II, uncontrolled ([place]) 07/21/2019  . Acute blood loss anemia 07/21/2019  . Leukocytosis 07/21/2019  . Stroke (cerebrum) ([place]) - R MCA infarct s/p tenecteplase and mechanical thrombectomy w/ d/t ICA dissection  07/15/2019  . Middle cerebral artery embolism, right 07/15/2019  . Controlled type 2 diabetes mellitus without complication, without long-term current use of insulin ([place]) 05/28/2017  . Attention deficit hyperactivity disorder (ADHD), combined type 04/18/2017  . Morbid obesity ([place]) 01/25/2016  . Umbilical hernia 28/40/6986  . Hiatal hernia 12/08/2015  . History of Clostridium difficile colitis 06/19/2014  . HTN (hypertension) 12/23/2013  . PCOS (polycystic ovarian syndrome) 12/23/2013  . Bariatric surgery status 01/28/2013   Phillips Grout PT, DPT, GCS   Aelyn Stanaland 11/25/2019, 12:39 PM  [place] MAIN [facility] SERVICES [address] [place], [place], [address] Phone: [phone]   Fax:  [phone]  Name: Kathryn Coffey MRN: [id number] Date of Birth: 03/29/1971

## 2019-11-26 ENCOUNTER — Ambulatory Visit: Payer: No Typology Code available for payment source | Admitting: Occupational Therapy

## 2019-11-26 ENCOUNTER — Other Ambulatory Visit: Payer: Self-pay

## 2019-11-26 DIAGNOSIS — M6281 Muscle weakness (generalized): Secondary | ICD-10-CM | POA: Diagnosis not present

## 2019-11-27 ENCOUNTER — Encounter: Payer: Self-pay | Admitting: Occupational Therapy

## 2019-11-27 NOTE — Therapy (Signed)
[place] MAIN [facility] SERVICES [address] [place], [place], [address] Phone: [phone]   Fax:  [phone]  Occupational Therapy Treatment  Patient Details  Name: Kathryn Coffey MRN: [id number] Date of Birth: [birth year] Referring Provider (OT): Raulkar   Encounter Date: 11/26/2019  OT End of Session - 11/27/19 0900    Visit Number  17    Number of Visits  24    Date for OT Re-Evaluation  12/01/19    Authorization Type  Progress report period starting  on 09/08/2019    Authorization Time Period  FOTO: 72    OT Start Time  1519    OT Stop Time  1600    OT Time Calculation (min)  41 min    Activity Tolerance  Patient tolerated treatment well    Behavior During Therapy  [facility] for tasks assessed/performed       Past Medical History:  Diagnosis Date  . Abdominal wall cellulitis 2013 and 2014  . Asthma    allergic to grasses  . B12 deficiency   . Complication of anesthesia   . Costochondritis   . Diabetes mellitus without complication ([place])   . Gastric anomaly    multiple small ulcers  . GERD (gastroesophageal reflux disease)   . Herpes 06/2018   POSSIBLY IN EYE- PT TAKING VALTREX AND WILL SEE OPTHAMOLOGIST TO SEE IF VALTREX IS WORKING  . History of abnormal cervical Pap smear   . History of Clostridium difficile colitis   . History of hiatal hernia   . History of kidney stones   . Hypertension   . IBS (irritable bowel syndrome)   . Migraine   . Morbid obesity ([place])    s/p attempted gastric banding now decompressed  . PCOS (polycystic ovarian syndrome)   . PONV (postoperative nausea and vomiting)    WITH SPINAL ONLY  . Stroke [facility])     Past Surgical History:  Procedure Laterality Date  . BREAST EXCISIONAL BIOPSY Left    cyst excision - Dr. Patty Sermons  . CESAREAN SECTION    . CHOLECYSTECTOMY    . COLONOSCOPY    . EAR CYST EXCISION Right 07/04/2018   Procedure: EXCISION GLOMUS TUMOR THUMBNAIL;  Surgeon: Corky Mull, MD;   Location: [facility] ORS;  Service: Orthopedics;  Laterality: Right;  . ESOPHAGOGASTRODUODENOSCOPY    . ESOPHAGOGASTRODUODENOSCOPY (EGD) WITH PROPOFOL N/A 12/06/2015   Procedure: ESOPHAGOGASTRODUODENOSCOPY (EGD) WITH PROPOFOL;  Surgeon: Manya Silvas, MD;  Location: [facility] ENDOSCOPY;  Service: Endoscopy;  Laterality: N/A;  . IR ANGIO INTRA EXTRACRAN SEL INTERNAL CAROTID UNI L MOD SED  07/15/2019  . IR ANGIO VERTEBRAL SEL SUBCLAVIAN INNOMINATE UNI R MOD SED  07/15/2019  . IR CT HEAD LTD  07/15/2019  . IR INTRAVSC STENT CERV CAROTID W/O EMB-PROT MOD SED INC ANGIO  07/15/2019  . IR PERCUTANEOUS ART THROMBECTOMY/INFUSION INTRACRANIAL INC DIAG ANGIO  07/15/2019  . LAPAROSCOPIC GASTRIC BANDING    . LAPAROSCOPIC GASTRIC RESTRICTIVE DUODENAL PROCEDURE (DUODENAL SWITCH) N/A 01/25/2016   Procedure: LAPAROSCOPIC GASTRIC RESTRICTIVE DUODENAL PROCEDURE (DUODENAL SWITCH);  Surgeon: Ladora Daniel, MD;  Location: [facility] ORS;  Service: General;  Laterality: N/A;  . OVARY SURGERY     x2  . RADIOLOGY WITH ANESTHESIA N/A 07/15/2019   Procedure: IR WITH ANESTHESIA;  Surgeon: Luanne Bras, MD;  Location: [place];  Service: Radiology;  Laterality: N/A;  . TONSILLECTOMY    . TUBAL LIGATION    . UMBILICAL HERNIA REPAIR N/A 01/25/2016  Procedure: LAPAROSCOPIC UMBILICAL HERNIA;  Surgeon: Ladora Daniel, MD;  Location: [facility] ORS;  Service: General;  Laterality: N/A;  . UMBILICAL HERNIA REPAIR N/A 10/20/2016   Procedure: HERNIA REPAIR UMBILICAL ADULT;  Surgeon: Leonie Green, MD;  Location: [facility] ORS;  Service: General;  Laterality: N/A;    There were no vitals filed for this visit.  Subjective Assessment - 11/27/19 0900    Subjective   Pt. reports that she is doing well today,  No pain.    Patient is accompanied by:  Family member    Pertinent History  Per pt. chart. Pt. is a 49 y.o. female with history of HTN, T2DM, obesity who was admitted on 07/15/19 with onset of HA progressing to flaccid left hemiparesis a few  hours later.  CT head showed acute nonhemorrhagic infarct in right basal ganglia with hyperdense right MCA sign.  CT A/P head neck showed perfusion deficit with occluded right ICA and slow flow in M1.  She underwent cerebral angiogram with revascularization of right MCA with T1C1 revascularization and repair of right ICA with flow diverter device. Stroke felt to be embolic likely due to ICA dissection. Postprocedure head CT was negative for bleed repeat CTA head neck showed reocclusion of right ICA origin and occlusion of right ICA stent but now with patent right MCA. Pt. received inpatient rehab services for 3 weeks, and home health services.    Currently in Pain?  No/denies     OT Treatment  Neuro muscular re-education:  Pt.tolerated weightbearing, and proprioceptive awareness through the LUE, and handwhile seated at the mat. Pt.continues to requireassist to position the LUE into position for weightbearing. Pt. also worked on alternating weightbearing with functional reaching, and graspingfor cones, and placing them onto an elevatedflat tabletopsurface with cues, support, and assist for the LUE. Emphasis was placed on placing the the cones on the table, and actively relaxing grip on the cone in preparation for extending herhand off of the cones. Pt. worked on reaching out to place the cones away from her body. Weightbearing, and proprioceptive input was performed in preparation for ROM, and facilitation of active movement.  Therapeutic Exercise:  Pt. worked on left shoulder stabilization exercises in supine with the shoulder flexed to 90 degrees, and the elbow in extension. Pt. was able toinitiate active shoulder stabilization responses minimally with support provided proximally.Assist was also provided at the elbow to assist in maintaining elbow in extension.Pt. was able to perform scapular protraction with support provided at the elbow, and forearm. Pt.worked on hand to face patterns  with support at the elbow, and handwith combinations of movement, and wrist extension.Pt. was able to perform elbow extension exercise against gravity. Pt. Worked on formulating diagonal patterns with assist from supine.Pt. Worked on [employer] while seated at the tabletop.Pt.worked on wrist, and digit extension with facilitation, andalternating weightbearing. Pt. worked on active supination with support at the forearm.   Pt. reports that she has to return back to work tomorrow morning, and will be using a one handed keyboard. Pt. Reports that she will need to relearn how to type with it. Pt.continues to makesteady progress. Pt. Will be returning to work for 4 hours at a time for the first few months,Pt. was able to activate increased wrist extension, as well as activate digit extension with facilitation, and alternating weightbearing. Pt. however, was able to initiate minimal 2nd digit extension, 4th digit, and 5th digit extension from the tabletop surface today.  Pt.is able to consistently initiate active left  shoulder horizontal abduction, elbow flexion, and extension, forearm supination, wrist extension, digit extension with facilitation, and thumb abduction.Pt. continues torequirework on improvingshoulder stabilization, wrist, and digitmovements for hand to face patterns, and to increase LUE engagement during ADLs, and IADL tasks.                        OT Education - 11/27/19 0900    Education Details  Left UE  functioning    Person(s) Educated  Patient;Spouse    Methods  Demonstration    Comprehension  Returned demonstration;Verbalized understanding;Verbal cues required          OT Long Term Goals - 10/29/19 1726      OT LONG TERM GOAL #1   Title  Pt. will increase left shoulder flexion PROM by 20 degrees to assist with UE dressing    Baseline  Pt. continues to present with limited left shoulder PROM    Time  12    Period  Weeks    Status  On-going     Target Date  12/01/19      OT LONG TERM GOAL #2   Title  Pt. will be able to actively stabilize left shoulder for 10 seconds in supine with shoulde flexed to 90 degrees, and elbow in extension in preparation for functional reaching.    Baseline  Pt. conitnues to be unable to stabilize the left shoulder, although pt. is activating intermittent muscle responses.    Time  12    Period  Weeks    Status  On-going    Target Date  12/01/19      OT LONG TERM GOAL #3   Title  Pt. will independently perform hand to face patterns with the LUE in preparation for light self grooming tasks.    Baseline  Eval: Pt. is able to initiate performing hand to face patterns with the LUE however is unable to consitently reach, and susutain her hand at her face..    Time  12    Period  Weeks    Status  On-going    Target Date  12/01/19      OT LONG TERM GOAL #4   Title  Pt. will initiate 10 degrees of active left wrist extension  in preparation for actively lifting her hand off of a surface.    Baseline  Pt. is inconsistently able to activate wrist extensors with facilitation.    Time  12    Period  Weeks    Status  On-going    Target Date  12/01/19      OT LONG TERM GOAL #5   Title  Pt. will initiate left hand digit extension in order to be able to actively release an objects during ADL tasks.    Baseline  Pt. is able to initiate relaxing flexor grip consistently when holding objects.,    Time  12    Period  Weeks    Status  On-going    Target Date  12/01/19      OT LONG TERM GOAL #6   Title  Pt. increase left hand digit flexion in order to be able to hold her toothbrush while applying toothpaste with her left right hand.    Baseline  Pt. is initiating activating gross digit flexors.    Time  12    Period  Weeks    Status  On-going    Target Date  12/01/19  Plan - 11/27/19 0901    Clinical Impression Statement  Pt. reports that she has to return back to work tomorrow morning, and  will be using a one handed keyboard. Pt. Reports that she will need to relearn how to type with it. Pt.continues to makesteady progress. Pt. Will be returning to work for 4 hours at a time for the first few months,Pt. was able to activate increased wrist extension, as well as activate digit extension with facilitation, and alternating weightbearing. Pt. however, was able to initiate minimal 2nd digit extension, 4th digit, and 5th digit extension from the tabletop surface today.  Pt.is able to consistently initiate active left shoulder horizontal abduction, elbow flexion, and extension, forearm supination, wrist extension, digit extension with facilitation, and thumb abduction.Pt. continues torequirework on improvingshoulder stabilization, wrist, and digitmovements for hand to face patterns, and to increase LUE engagement during ADLs, and IADL tasks.   OT Occupational Profile and History  Detailed Assessment- Review of Records and additional review of physical, cognitive, psychosocial history related to current functional performance    Occupational performance deficits (Please refer to evaluation for details):  ADL's;IADL's    Rehab Potential  Good    Clinical Decision Making  Several treatment options, min-mod task modification necessary    Comorbidities Affecting Occupational Performance:  May have comorbidities impacting occupational performance    Modification or Assistance to Complete Evaluation   Min-Moderate modification of tasks or assist with assess necessary to complete eval    OT Frequency  2x / week    OT Duration  12 weeks    OT Treatment/Interventions  Self-care/ADL training;Moist Heat;DME and/or AE instruction;Neuromuscular education;Therapeutic exercise;Therapeutic activities;Energy conservation;Patient/family education    Consulted and Agree with Plan of Care  Patient       Patient will benefit from skilled therapeutic intervention in order to improve the following deficits  and impairments:           Visit Diagnosis: Muscle weakness (generalized)    Problem List Patient Active Problem List   Diagnosis Date Noted  . E. coli UTI   . Diabetes mellitus type 2 in obese ([place])   . Chronic pain of right knee   . 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 Hemiparesis affecting left side as late effect of stroke ([place])   . Acute ischemic right middle cerebral artery (MCA) stroke ([place]) 07/22/2019  . Carotid artery dissection  (HCC) s/p stent placement 07/21/2019  . Diabetes mellitus type II, uncontrolled ([place]) 07/21/2019  . Acute blood loss anemia 07/21/2019  . Leukocytosis 07/21/2019  . Stroke (cerebrum) ([place]) - R MCA infarct s/p tenecteplase and mechanical thrombectomy w/ d/t ICA dissection  07/15/2019  . Middle cerebral artery embolism, right 07/15/2019  . Controlled type 2 diabetes mellitus without complication, without long-term current use of insulin ([place]) 05/28/2017  . Attention deficit hyperactivity disorder (ADHD), combined type 04/18/2017  . Morbid obesity ([place]) 01/25/2016  . Umbilical hernia [id number]  . Hiatal hernia 12/08/2015  . History of Clostridium difficile colitis 06/19/2014  . HTN (hypertension) 12/23/2013  . PCOS (polycystic ovarian syndrome) 12/23/2013  . Bariatric surgery status 01/28/2013    Harrel Carina, MS, OTR/L 11/27/2019, 9:03 AM  [place] MAIN [facility] SERVICES [address] [place], [place], [address] Phone: [phone]   Fax:  [phone]  Name: VENESSA BODAMER MRN: [id number] Date of Birth: 1971/02/08

## 2019-11-29 ENCOUNTER — Other Ambulatory Visit: Payer: Self-pay

## 2019-12-01 ENCOUNTER — Other Ambulatory Visit: Payer: Self-pay

## 2019-12-01 ENCOUNTER — Encounter: Payer: Self-pay | Admitting: Occupational Therapy

## 2019-12-01 ENCOUNTER — Ambulatory Visit: Payer: No Typology Code available for payment source | Admitting: Occupational Therapy

## 2019-12-01 ENCOUNTER — Ambulatory Visit: Payer: No Typology Code available for payment source

## 2019-12-01 DIAGNOSIS — R278 Other lack of coordination: Secondary | ICD-10-CM

## 2019-12-01 DIAGNOSIS — R262 Difficulty in walking, not elsewhere classified: Secondary | ICD-10-CM

## 2019-12-01 DIAGNOSIS — M6281 Muscle weakness (generalized): Secondary | ICD-10-CM

## 2019-12-01 DIAGNOSIS — R2689 Other abnormalities of gait and mobility: Secondary | ICD-10-CM

## 2019-12-01 MED ORDER — MIRABEGRON ER 25 MG PO TB24: 25.0000 mg | ORAL_TABLET | Freq: Every day | ORAL | 0 refills | Status: DC

## 2019-12-01 NOTE — Therapy (Signed)
[place] MAIN [facility] SERVICES [address] [place], [place], [address] Phone: [phone]   Fax:  [phone]  Occupational Therapy Treatment  Patient Details  Name: Kathryn Coffey MRN: [id number] Date of Birth: March 30, 1971 Referring Provider (OT): Raulkar   Encounter Date: 12/01/2019  OT End of Session - 12/01/19 1749    Visit Number  18    Number of Visits  24    Date for OT Re-Evaluation  12/01/19    Authorization Type  Progress report period starting  on 09/08/2019    Authorization Time Period  FOTO: 50    OT Start Time  1520    OT Stop Time  1600    OT Time Calculation (min)  40 min    Activity Tolerance  Patient tolerated treatment well    Behavior During Therapy  [facility] for tasks assessed/performed       Past Medical History:  Diagnosis Date  . Abdominal wall cellulitis 2013 and 2014  . Asthma    allergic to grasses  . B12 deficiency   . Complication of anesthesia   . Costochondritis   . Diabetes mellitus without complication ([place])   . Gastric anomaly    multiple small ulcers  . GERD (gastroesophageal reflux disease)   . Herpes 06/2018   POSSIBLY IN EYE- PT TAKING VALTREX AND WILL SEE OPTHAMOLOGIST TO SEE IF VALTREX IS WORKING  . History of abnormal cervical Pap smear   . History of Clostridium difficile colitis   . History of hiatal hernia   . History of kidney stones   . Hypertension   . IBS (irritable bowel syndrome)   . Migraine   . Morbid obesity ([place])    s/p attempted gastric banding now decompressed  . PCOS (polycystic ovarian syndrome)   . PONV (postoperative nausea and vomiting)    WITH SPINAL ONLY  . Stroke [facility])     Past Surgical History:  Procedure Laterality Date  . BREAST EXCISIONAL BIOPSY Left    cyst excision - Dr. Patty Sermons  . CESAREAN SECTION    . CHOLECYSTECTOMY    . COLONOSCOPY    . EAR CYST EXCISION Right 07/04/2018   Procedure: EXCISION GLOMUS TUMOR THUMBNAIL;  Surgeon: Corky Mull, MD;   Location: [facility] ORS;  Service: Orthopedics;  Laterality: Right;  . ESOPHAGOGASTRODUODENOSCOPY    . ESOPHAGOGASTRODUODENOSCOPY (EGD) WITH PROPOFOL N/A 12/06/2015   Procedure: ESOPHAGOGASTRODUODENOSCOPY (EGD) WITH PROPOFOL;  Surgeon: Manya Silvas, MD;  Location: [facility] ENDOSCOPY;  Service: Endoscopy;  Laterality: N/A;  . IR ANGIO INTRA EXTRACRAN SEL INTERNAL CAROTID UNI L MOD SED  07/15/2019  . IR ANGIO VERTEBRAL SEL SUBCLAVIAN INNOMINATE UNI R MOD SED  07/15/2019  . IR CT HEAD LTD  07/15/2019  . IR INTRAVSC STENT CERV CAROTID W/O EMB-PROT MOD SED INC ANGIO  07/15/2019  . IR PERCUTANEOUS ART THROMBECTOMY/INFUSION INTRACRANIAL INC DIAG ANGIO  07/15/2019  . LAPAROSCOPIC GASTRIC BANDING    . LAPAROSCOPIC GASTRIC RESTRICTIVE DUODENAL PROCEDURE (DUODENAL SWITCH) N/A 01/25/2016   Procedure: LAPAROSCOPIC GASTRIC RESTRICTIVE DUODENAL PROCEDURE (DUODENAL SWITCH);  Surgeon: Ladora Daniel, MD;  Location: [facility] ORS;  Service: General;  Laterality: N/A;  . OVARY SURGERY     x2  . RADIOLOGY WITH ANESTHESIA N/A 07/15/2019   Procedure: IR WITH ANESTHESIA;  Surgeon: Luanne Bras, MD;  Location: [place];  Service: Radiology;  Laterality: N/A;  . TONSILLECTOMY    . TUBAL LIGATION    . UMBILICAL HERNIA REPAIR N/A 01/25/2016  Procedure: LAPAROSCOPIC UMBILICAL HERNIA;  Surgeon: Ladora Daniel, MD;  Location: [facility] ORS;  Service: General;  Laterality: N/A;  . UMBILICAL HERNIA REPAIR N/A 10/20/2016   Procedure: HERNIA REPAIR UMBILICAL ADULT;  Surgeon: Leonie Green, MD;  Location: [facility] ORS;  Service: General;  Laterality: N/A;    There were no vitals filed for this visit.  Subjective Assessment - 12/01/19 1749    Subjective   Pt. reports that she is doing well today,  No pain.    Patient is accompanied by:  Family member    Pertinent History  Per pt. chart. Pt. is a 49 y.o. female with history of HTN, T2DM, obesity who was admitted on 07/15/19 with onset of HA progressing to flaccid left hemiparesis a few  hours later.  CT head showed acute nonhemorrhagic infarct in right basal ganglia with hyperdense right MCA sign.  CT A/P head neck showed perfusion deficit with occluded right ICA and slow flow in M1.  She underwent cerebral angiogram with revascularization of right MCA with T1C1 revascularization and repair of right ICA with flow diverter device. Stroke felt to be embolic likely due to ICA dissection. Postprocedure head CT was negative for bleed repeat CTA head neck showed reocclusion of right ICA origin and occlusion of right ICA stent but now with patent right MCA. Pt. received inpatient rehab services for 3 weeks, and home health services.    Currently in Pain?  No/denies      OT Treatment  Neuro muscular re-education:  Pt.tolerated weightbearing, and proprioceptive awareness through the LUE, and handwhile seated at the mat. Pt.continues to requireassist to position the LUE into position for weightbearing. Pt. also worked on alternating weightbearing with functional reaching, and graspingfor cups, and cones, and placing them onto an elevatedflat tabletopsurface with cues, support, and assist for the LUE. Emphasis was placed on placing the the cones on the table, and actively relaxing grip on the cone in preparation for extending herhand off of the cones. Pt. worked on reaching out to place the cones away from her body. Weightbearing, and proprioceptive input was performed in preparation for ROM, and facilitation of active movement.  Therapeutic Exercise:  Pt. worked on left shoulder stabilization exercises in supine with the shoulder flexed to 90 degrees, and the elbow in extension. Pt. was able toinitiate active shoulder stabilization responses minimally with support provided proximally.Assist was also provided at the elbow to assist in maintaining elbow in extension.Pt. was able to perform scapular protraction with support provided at the elbow, and forearm.  Pt. Worked on AAROM  shoulder flexion in sidelying for a gravity eliminated plane using the UE Ranger. Pt. Worked on using a resistance with yellow theraband, modified to 1# weight in this plane. Pt.worked on hand to face patterns with support at the elbow, and handwith combinations of movement, and wrist extension.Pt. was able to perform elbow extension exercise against gravity.Pt. Worked on AAROMwhile seated at the tabletop.Pt.worked on wrist, and digit extension with facilitation, andalternating weightbearing. Pt. worked on active supination with support at the forearm.   Pt. reports that it is taking time for her to remember everything that she needs to do for work since not being there since November. Pt. Reports that she is now using a standard keyboard with her right hand. Pt.continues to makesteady progress. Pt. continues to be able to able to activatewrist extension, as well as activate digit extension with facilitation, and alternating weightbearing. Pt. however, was able to initiateminimal 2nd digit extension, and 5th  digit extension from the tabletop surface today.Pt.is able to consistently initiate active left shoulder horizontal abduction, elbow flexion, and extension, forearm supination, wrist extension, digit extension with facilitation, and thumb abduction.Pt. continues torequirework on improvingshoulder stabilization, wrist, and digitmovements for hand to face patterns, and to increase LUE engagement during ADLs, and IADL tasks.                        OT Education - 12/01/19 1749    Education Details  Left UE  functioning    Person(s) Educated  Patient;Spouse    Comprehension  Returned demonstration;Verbalized understanding;Verbal cues required          OT Long Term Goals - 10/29/19 1726      OT LONG TERM GOAL #1   Title  Pt. will increase left shoulder flexion PROM by 20 degrees to assist with UE dressing    Baseline  Pt. continues to present with  limited left shoulder PROM    Time  12    Period  Weeks    Status  On-going    Target Date  12/01/19      OT LONG TERM GOAL #2   Title  Pt. will be able to actively stabilize left shoulder for 10 seconds in supine with shoulde flexed to 90 degrees, and elbow in extension in preparation for functional reaching.    Baseline  Pt. conitnues to be unable to stabilize the left shoulder, although pt. is activating intermittent muscle responses.    Time  12    Period  Weeks    Status  On-going    Target Date  12/01/19      OT LONG TERM GOAL #3   Title  Pt. will independently perform hand to face patterns with the LUE in preparation for light self grooming tasks.    Baseline  Eval: Pt. is able to initiate performing hand to face patterns with the LUE however is unable to consitently reach, and susutain her hand at her face..    Time  12    Period  Weeks    Status  On-going    Target Date  12/01/19      OT LONG TERM GOAL #4   Title  Pt. will initiate 10 degrees of active left wrist extension  in preparation for actively lifting her hand off of a surface.    Baseline  Pt. is inconsistently able to activate wrist extensors with facilitation.    Time  12    Period  Weeks    Status  On-going    Target Date  12/01/19      OT LONG TERM GOAL #5   Title  Pt. will initiate left hand digit extension in order to be able to actively release an objects during ADL tasks.    Baseline  Pt. is able to initiate relaxing flexor grip consistently when holding objects.,    Time  12    Period  Weeks    Status  On-going    Target Date  12/01/19      OT LONG TERM GOAL #6   Title  Pt. increase left hand digit flexion in order to be able to hold her toothbrush while applying toothpaste with her left right hand.    Baseline  Pt. is initiating activating gross digit flexors.    Time  12    Period  Weeks    Status  On-going    Target Date  12/01/19  Plan - 12/01/19 1750    Clinical  Impression Statement  Pt. reports that it is taking time for her to remember everything that she needs to do for work since not being there since November. Pt. Reports that she is now using a standard keyboard with her right hand. Pt.continues to makesteady progress. Pt. continues to be able to able to activatewrist extension, as well as activate digit extension with facilitation, and alternating weightbearing. Pt. however, was able to initiateminimal 2nd digit extension, and 5th digit extension from the tabletop surface today.Pt.is able to consistently initiate active left shoulder horizontal abduction, elbow flexion, and extension, forearm supination, wrist extension, digit extension with facilitation, and thumb abduction.Pt. continues torequirework on improvingshoulder stabilization, wrist, and digitmovements for hand to face patterns, and to increase LUE engagement during ADLs, and IADL tasks.      OT Occupational Profile and History  Detailed Assessment- Review of Records and additional review of physical, cognitive, psychosocial history related to current functional performance    Occupational performance deficits (Please refer to evaluation for details):  ADL's;IADL's    Rehab Potential  Good    Clinical Decision Making  Several treatment options, min-mod task modification necessary    Comorbidities Affecting Occupational Performance:  May have comorbidities impacting occupational performance    Modification or Assistance to Complete Evaluation   Min-Moderate modification of tasks or assist with assess necessary to complete eval    OT Frequency  2x / week    OT Duration  12 weeks    OT Treatment/Interventions  Self-care/ADL training;Moist Heat;DME and/or AE instruction;Neuromuscular education;Therapeutic exercise;Therapeutic activities;Energy conservation;Patient/family education    Consulted and Agree with Plan of Care  Patient       Patient will benefit from skilled  therapeutic intervention in order to improve the following deficits and impairments:           Visit Diagnosis: Muscle weakness (generalized)  Other lack of coordination    Problem List Patient Active Problem List   Diagnosis Date Noted  . E. coli UTI   . Diabetes mellitus type 2 in obese ([place])   . Chronic pain of right knee   . 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 Hemiparesis affecting left side as late effect of stroke ([place])   . Acute ischemic right middle cerebral artery (MCA) stroke ([place]) 07/22/2019  . Carotid artery dissection  (HCC) s/p stent placement 07/21/2019  . Diabetes mellitus type II, uncontrolled ([place]) 07/21/2019  . Acute blood loss anemia 07/21/2019  . Leukocytosis 07/21/2019  . Stroke (cerebrum) ([place]) - R MCA infarct s/p tenecteplase and mechanical thrombectomy w/ d/t ICA dissection  07/15/2019  . Middle cerebral artery embolism, right 07/15/2019  . Controlled type 2 diabetes mellitus without complication, without long-term current use of insulin ([place]) 05/28/2017  . Attention deficit hyperactivity disorder (ADHD), combined type 04/18/2017  . Morbid obesity ([place]) 01/25/2016  . Umbilical hernia [id number]  . Hiatal hernia 12/08/2015  . History of Clostridium difficile colitis 06/19/2014  . HTN (hypertension) 12/23/2013  . PCOS (polycystic ovarian syndrome) 12/23/2013  . Bariatric surgery status 01/28/2013    Harrel Carina, MS, OTR/L 12/01/2019, 5:52 PM  [place] MAIN [facility] SERVICES [address] [place], [place], [address] Phone: [phone]   Fax:  [phone]  Name: Kathryn Coffey MRN: [id number] Date of Birth: [birth year]

## 2019-12-01 NOTE — Therapy (Signed)
[place] MAIN [facility] SERVICES [address] [place], [place], [address] Phone: [phone]   Fax:  [phone]  Physical Therapy Treatment  Patient Details  Name: Kathryn Coffey MRN: [id number] Date of Birth: [birth year] No data recorded  Encounter Date: 12/01/2019  PT End of Session - 12/01/19 1558    Visit Number  14    Number of Visits  49    Date for PT Re-Evaluation  01/26/20    PT Start Time  1600    PT Stop Time  1645    PT Time Calculation (min)  45 min    Equipment Utilized During Treatment  Gait belt    Activity Tolerance  Patient tolerated treatment well    Behavior During Therapy  WFL for tasks assessed/performed       Past Medical History:  Diagnosis Date  . Abdominal wall cellulitis 2013 and 2014  . Asthma    allergic to grasses  . B12 deficiency   . Complication of anesthesia   . Costochondritis   . Diabetes mellitus without complication ([place])   . Gastric anomaly    multiple small ulcers  . GERD (gastroesophageal reflux disease)   . Herpes 06/2018   POSSIBLY IN EYE- PT TAKING VALTREX AND WILL SEE OPTHAMOLOGIST TO SEE IF VALTREX IS WORKING  . History of abnormal cervical Pap smear   . History of Clostridium difficile colitis   . History of hiatal hernia   . History of kidney stones   . Hypertension   . IBS (irritable bowel syndrome)   . Migraine   . Morbid obesity ([place])    s/p attempted gastric banding now decompressed  . PCOS (polycystic ovarian syndrome)   . PONV (postoperative nausea and vomiting)    WITH SPINAL ONLY  . Stroke [facility])     Past Surgical History:  Procedure Laterality Date  . BREAST EXCISIONAL BIOPSY Left    cyst excision - Dr. Patty Sermons  . CESAREAN SECTION    . CHOLECYSTECTOMY    . COLONOSCOPY    . EAR CYST EXCISION Right 07/04/2018   Procedure: EXCISION GLOMUS TUMOR THUMBNAIL;  Surgeon: Corky Mull, MD;  Location: [facility] ORS;  Service: Orthopedics;  Laterality: Right;  .  ESOPHAGOGASTRODUODENOSCOPY    . ESOPHAGOGASTRODUODENOSCOPY (EGD) WITH PROPOFOL N/A 12/06/2015   Procedure: ESOPHAGOGASTRODUODENOSCOPY (EGD) WITH PROPOFOL;  Surgeon: Manya Silvas, MD;  Location: [facility] ENDOSCOPY;  Service: Endoscopy;  Laterality: N/A;  . IR ANGIO INTRA EXTRACRAN SEL INTERNAL CAROTID UNI L MOD SED  07/15/2019  . IR ANGIO VERTEBRAL SEL SUBCLAVIAN INNOMINATE UNI R MOD SED  07/15/2019  . IR CT HEAD LTD  07/15/2019  . IR INTRAVSC STENT CERV CAROTID W/O EMB-PROT MOD SED INC ANGIO  07/15/2019  . IR PERCUTANEOUS ART THROMBECTOMY/INFUSION INTRACRANIAL INC DIAG ANGIO  07/15/2019  . LAPAROSCOPIC GASTRIC BANDING    . LAPAROSCOPIC GASTRIC RESTRICTIVE DUODENAL PROCEDURE (DUODENAL SWITCH) N/A 01/25/2016   Procedure: LAPAROSCOPIC GASTRIC RESTRICTIVE DUODENAL PROCEDURE (DUODENAL SWITCH);  Surgeon: Ladora Daniel, MD;  Location: [facility] ORS;  Service: General;  Laterality: N/A;  . OVARY SURGERY     x2  . RADIOLOGY WITH ANESTHESIA N/A 07/15/2019   Procedure: IR WITH ANESTHESIA;  Surgeon: Luanne Bras, MD;  Location: [place];  Service: Radiology;  Laterality: N/A;  . TONSILLECTOMY    . TUBAL LIGATION    . UMBILICAL HERNIA REPAIR N/A 01/25/2016   Procedure: LAPAROSCOPIC UMBILICAL HERNIA;  Surgeon: Ladora Daniel, MD;  Location: [facility] ORS;  Service: General;  Laterality: N/A;  . UMBILICAL HERNIA REPAIR N/A 10/20/2016   Procedure: HERNIA REPAIR UMBILICAL ADULT;  Surgeon: Leonie Green, MD;  Location: [facility] ORS;  Service: General;  Laterality: N/A;    There were no vitals filed for this visit.  Subjective Assessment - 12/01/19 1558    Subjective  Patient reports she is doing well today. No resting pain reported upon arrival. She went to the gym this weekend and worked on her leg strength. She is wearing her knee brace on the L side which has helped with her hyperextension but she has had some episodes of buckling. Compliant with HEP. She continues to have minimal function out of LUE but no  resting pain at this time. No specific questions or concerns currently.    Pertinent History  Patient was in the hospital [place] and then in patient rehab 3 weeks. She was dicharged 08/13/19 and then HHPT and was discharged 09/03/19. She has not had any falls.    Currently in Pain?  No/denies         TREATMENT   Ther-ex Supine L SLRwith 4# ankle weights (AW)2x 20; Hooklying clams with green tband 2 x 20; Hooklying adductor squeezes with manual resistance 2 x 20; Hooklying L single leg bridge with bolster under L knee and RLE extended 2 x 10; R sidelying L hip straight leg abduction with 4# AW 2 x 20; Prone L HS curls with light manual resistance from therapist 2 x 20; Prone hip extension 2 x 20; Precor LLE single leg press 45# x 20, 55# x 8 no assist today required to initiate first rep during first set, minA+1 for last 2 reps of second set; Standing double leg heel raises x 20; Wall squat side stepping 6' x 4 lengths, pt struggles maintaining L knee flexion with stepping to the right; Wall squat holds 20s hold/20s rest x 4;   Pt educated throughout session about proper posture and technique with exercises. Improved exercise technique, movement at target joints, use of target muscles after min to mod verbal, visual, tactile cues.   Pt demonstrates excellent motivation during sessiontoday. She isable to increase her resistance and reps on the leg press today. Continued with focused strengthening on the mat table as well as focus on L quad strength/endurance. Her gait continues to look much better wearing knee brace with no L knee hyperextension.Pt encouraged to continue HEP. Pt will benefit from PT services to address deficits in strength, balance, and mobility in order to return to full function at home.                        PT Short Term Goals - 11/03/19 1356      PT SHORT TERM GOAL #1   Title  Patient will be independent in home exercise  program to improve strength/mobility for better functional independence with ADLs.    Baseline  11/03/19: Pt performing HEP at least 5d/wk    Time  6    Period  Weeks    Status  Partially Met    Target Date  12/15/19      PT SHORT TERM GOAL #2   Title  Patient (> 28 years old) will complete five times sit to stand test in < 15 seconds indicating an increased LE strength and improved balance.    Baseline  eval: 15.28s; 11/03/19: 12.2s    Time  6    Period  Weeks  Status  Achieved    Target Date  10/20/19        PT Long Term Goals - 11/03/19 1359      PT LONG TERM GOAL #1   Title  Patient will increase Berg Balance score by > 6 points to demonstrate decreased fall risk during functional activities.    Baseline  09/08/19: 48/56; 11/03/19: 51/56    Time  12    Period  Weeks    Status  Partially Met    Target Date  01/26/20      PT LONG TERM GOAL #2   Title  Patient will increase six minute walk test distance to >1200 for progression to community ambulator and improve gait ability    Baseline  09/08/19: 525', 11/03/19: 817';    Time  12    Period  Weeks    Status  Partially Met    Target Date  01/26/20      PT LONG TERM GOAL #3   Title  Patient will increase 10 meter walk test to >1.3ms as to improve gait speed for better community ambulation and to reduce fall risk    Baseline  09/08/19:  0.51 m/s; 11/03/19: self-selected: 13.2s = 0.76 m/s, fastest: 12.5s = 0.8 m/s    Time  12    Period  Weeks    Status  Partially Met    Target Date  01/26/20            Plan - 12/01/19 1559    Clinical Impression Statement  Pt demonstrates excellent motivation during session today. She is able to increase her resistance and reps on the leg press today. Continued with focused strengthening on the mat table as well as focus on L quad strength/endurance. Her gait continues to look much better wearing knee brace with no L knee hyperextension. Pt encouraged to continue HEP. Pt will benefit from  PT services to address deficits in strength, balance, and mobility in order to return to full function at home.    Personal Factors and Comorbidities  Comorbidity 1    Comorbidities  diabetes, high blood pressure, reflux,    Examination-Activity Limitations  Carry;Caring for Others;Bend;Locomotion Level;Self Feeding;Sleep;Toileting    Examination-Participation Restrictions  Cleaning;Community Activity;Driving;Laundry;Shop    Stability/Clinical Decision Making  Stable/Uncomplicated    Clinical Decision Making  Low    Rehab Potential  Good    PT Frequency  2x / week    PT Duration  12 weeks    PT Treatment/Interventions  Manual techniques;Functional mobility training;Stair training;Gait training;Electrical Stimulation;Moist Heat;Ultrasound;Therapeutic exercise;Balance training;Neuromuscular re-education    PT Next Visit Plan  strengthening and balance    PT Home Exercise Plan  standing hip abd,    Consulted and Agree with Plan of Care  Patient       Patient will benefit from skilled therapeutic intervention in order to improve the following deficits and impairments:  Abnormal gait, Decreased knowledge of use of DME, Dizziness, Pain, Decreased coordination, Impaired UE functional use, Decreased strength, Decreased endurance, Decreased activity tolerance, Decreased balance, Difficulty walking, Obesity  Visit Diagnosis: Muscle weakness (generalized)  Difficulty in walking, not elsewhere classified  Other abnormalities of gait and mobility     Problem List Patient Active Problem List   Diagnosis Date Noted  . E. coli UTI   . Diabetes mellitus type 2 in obese ([place]   . Chronic pain of right knee   . Flaccid monoplegia of upper extremity ([place]   . Hemiparesis affecting left side  as late effect of stroke ([place])   . Acute ischemic right middle cerebral artery (MCA) stroke ([place]) 07/22/2019  . Carotid artery dissection  (HCC) s/p stent placement 07/21/2019  . Diabetes mellitus type II,  uncontrolled ([place]) 07/21/2019  . Acute blood loss anemia 07/21/2019  . Leukocytosis 07/21/2019  . Stroke (cerebrum) ([place]) - R MCA infarct s/p tenecteplase and mechanical thrombectomy w/ d/t ICA dissection  07/15/2019  . Middle cerebral artery embolism, right 07/15/2019  . Controlled type 2 diabetes mellitus without complication, without long-term current use of insulin ([place]) 05/28/2017  . Attention deficit hyperactivity disorder (ADHD), combined type 04/18/2017  . Morbid obesity ([place]) 01/25/2016  . Umbilical hernia 65/46/5035  . Hiatal hernia 12/08/2015  . History of Clostridium difficile colitis 06/19/2014  . HTN (hypertension) 12/23/2013  . PCOS (polycystic ovarian syndrome) 12/23/2013  . Bariatric surgery status 01/28/2013   Phillips Grout PT, DPT, GCS  [place] 12/01/2019, 4:59 PM  [place] MAIN [facility] SERVICES [address] [place], [place], [address] Phone: [phone]   Fax:  [phone]  Name: Kathryn Coffey MRN: [id number] Date of Birth: 05-23-1971

## 2019-12-03 ENCOUNTER — Other Ambulatory Visit: Payer: Self-pay

## 2019-12-03 ENCOUNTER — Ambulatory Visit: Payer: No Typology Code available for payment source | Admitting: Occupational Therapy

## 2019-12-03 ENCOUNTER — Encounter: Payer: Self-pay | Admitting: Occupational Therapy

## 2019-12-03 DIAGNOSIS — M6281 Muscle weakness (generalized): Secondary | ICD-10-CM

## 2019-12-03 NOTE — Therapy (Signed)
[place] MAIN [facility] SERVICES [address] [place], [place], [address] Phone: [phone]   Fax:  [phone]  Occupational Therapy Treatment/Recertification Note  Patient Details  Name: Kathryn Coffey MRN: [id number] Date of Birth: 1970/10/03 Referring Provider (OT): Raulkar   Encounter Date: 12/03/2019  OT End of Session - 12/03/19 1612    Visit Number  19    Number of Visits  24    Date for OT Re-Evaluation  02/25/20    Authorization Type  Progress report period starting  on 09/08/2019    OT Start Time  1518    OT Stop Time  1600    OT Time Calculation (min)  42 min    Activity Tolerance  Patient tolerated treatment well    Behavior During Therapy  [facility] for tasks assessed/performed       Past Medical History:  Diagnosis Date  . Abdominal wall cellulitis 2013 and 2014  . Asthma    allergic to grasses  . B12 deficiency   . Complication of anesthesia   . Costochondritis   . Diabetes mellitus without complication ([place])   . Gastric anomaly    multiple small ulcers  . GERD (gastroesophageal reflux disease)   . Herpes 06/2018   POSSIBLY IN EYE- PT TAKING VALTREX AND WILL SEE OPTHAMOLOGIST TO SEE IF VALTREX IS WORKING  . History of abnormal cervical Pap smear   . History of Clostridium difficile colitis   . History of hiatal hernia   . History of kidney stones   . Hypertension   . IBS (irritable bowel syndrome)   . Migraine   . Morbid obesity ([place])    s/p attempted gastric banding now decompressed  . PCOS (polycystic ovarian syndrome)   . PONV (postoperative nausea and vomiting)    WITH SPINAL ONLY  . Stroke [facility])     Past Surgical History:  Procedure Laterality Date  . BREAST EXCISIONAL BIOPSY Left    cyst excision - Dr. Patty Sermons  . CESAREAN SECTION    . CHOLECYSTECTOMY    . COLONOSCOPY    . EAR CYST EXCISION Right 07/04/2018   Procedure: EXCISION GLOMUS TUMOR THUMBNAIL;  Surgeon: Corky Mull, MD;  Location: [facility]  ORS;  Service: Orthopedics;  Laterality: Right;  . ESOPHAGOGASTRODUODENOSCOPY    . ESOPHAGOGASTRODUODENOSCOPY (EGD) WITH PROPOFOL N/A 12/06/2015   Procedure: ESOPHAGOGASTRODUODENOSCOPY (EGD) WITH PROPOFOL;  Surgeon: Manya Silvas, MD;  Location: [facility] ENDOSCOPY;  Service: Endoscopy;  Laterality: N/A;  . IR ANGIO INTRA EXTRACRAN SEL INTERNAL CAROTID UNI L MOD SED  07/15/2019  . IR ANGIO VERTEBRAL SEL SUBCLAVIAN INNOMINATE UNI R MOD SED  07/15/2019  . IR CT HEAD LTD  07/15/2019  . IR INTRAVSC STENT CERV CAROTID W/O EMB-PROT MOD SED INC ANGIO  07/15/2019  . IR PERCUTANEOUS ART THROMBECTOMY/INFUSION INTRACRANIAL INC DIAG ANGIO  07/15/2019  . LAPAROSCOPIC GASTRIC BANDING    . LAPAROSCOPIC GASTRIC RESTRICTIVE DUODENAL PROCEDURE (DUODENAL SWITCH) N/A 01/25/2016   Procedure: LAPAROSCOPIC GASTRIC RESTRICTIVE DUODENAL PROCEDURE (DUODENAL SWITCH);  Surgeon: Ladora Daniel, MD;  Location: [facility] ORS;  Service: General;  Laterality: N/A;  . OVARY SURGERY     x2  . RADIOLOGY WITH ANESTHESIA N/A 07/15/2019   Procedure: IR WITH ANESTHESIA;  Surgeon: Luanne Bras, MD;  Location: [place];  Service: Radiology;  Laterality: N/A;  . TONSILLECTOMY    . TUBAL LIGATION    . UMBILICAL HERNIA REPAIR N/A 01/25/2016   Procedure: LAPAROSCOPIC UMBILICAL HERNIA;  Surgeon: Venia Carbon  Duke Salvia, MD;  Location: [facility] ORS;  Service: General;  Laterality: N/A;  . UMBILICAL HERNIA REPAIR N/A 10/20/2016   Procedure: HERNIA REPAIR UMBILICAL ADULT;  Surgeon: Leonie Green, MD;  Location: [facility] ORS;  Service: General;  Laterality: N/A;    There were no vitals filed for this visit.  Subjective Assessment - 12/03/19 1629    Subjective   Pt. reports fatigue from working, and doing errands.    Patient is accompanied by:  Family member    Pertinent History  Per pt. chart. Pt. is a 49 y.o. female with history of HTN, T2DM, obesity who was admitted on 07/15/19 with onset of HA progressing to flaccid left hemiparesis a few hours later.   CT head showed acute nonhemorrhagic infarct in right basal ganglia with hyperdense right MCA sign.  CT A/P head neck showed perfusion deficit with occluded right ICA and slow flow in M1.  She underwent cerebral angiogram with revascularization of right MCA with T1C1 revascularization and repair of right ICA with flow diverter device. Stroke felt to be embolic likely due to ICA dissection. Postprocedure head CT was negative for bleed repeat CTA head neck showed reocclusion of right ICA origin and occlusion of right ICA stent but now with patent right MCA. Pt. received inpatient rehab services for 3 weeks, and home health services.    Currently in Pain?  No/denies      OT Treatment  Neuro muscular re-education:  Pt.tolerated weightbearing, and proprioceptive awareness through the LUE, and handwhile seated at the mat. Pt.continues to requireassist to position the LUE into position for weightbearing. Pt. also worked on alternating weightbearing with functional reaching, and graspingfor cups, and cones, and placing them onto an elevatedflat tabletopsurface with cues, support, and assist for the LUE. Emphasis was placed on placing the the cones on the table, and actively relaxing grip on the cone in preparation for extending herhand off of the cones. Pt. worked on reaching out to place the cones away from her body.Weightbearing, and proprioceptive input was performed in preparation for ROM, and facilitation of active movement.  Therapeutic Exercise:  Pt. worked on left shoulder stabilization exercises in supine with the shoulder flexed to 90 degrees, and the elbow in extension. Pt. was able toinitiate active shoulder stabilization responses minimally with support provided proximally.Assist was also provided at the elbow to assist in maintaining elbow in extension.Pt. was able to perform scapular protraction with support provided at the elbow, and forearm.  Pt. Worked on AAROM shoulder flexion  in sidelying for a gravity eliminated plane using the UE Ranger.  Pt.worked on hand to face patterns with support at the elbow, and handwith combinations of movement, and wrist extension.Pt. was able to perform elbow extension exercise against gravity.Pt. Worked on AAROMwhile seated at the tabletop with a 35 degree incline against gravity.Pt.worked on wrist, and digit extension with facilitation, andalternating weightbearing. Pt. worked on active supination with support at the forearm.   Pt.reports fatigue from working this morning, and doing errands. Pt. Reports that she is now using a standard keyboard with her right hand. Pt.continues to [facility] progress with shoulder AROM, hand to face patterns, and wrist, and digit extension.Pt.continues to be able to activate wrist extension, as well as activate digit extension with facilitation, and alternating weightbearing. Pt.continues to be able to initiateminimal 2nddigit extension, and 5th digit extensionfrom the tabletop surface today.Pt.is able to consistently initiate active left shoulder horizontal abduction, elbow flexion, and extension, forearm supination, wrist extension, digit extension with facilitation, and  thumb abduction.Pt. continues torequirework on improvingshoulder stabilization, wrist, and digitmovements for hand to face patterns, and to increase LUE engagement during ADLs, and IADL tasks.                      OT Education - 12/03/19 1612    Education Details  Left UE  functioning    Person(s) Educated  Patient;Spouse    Methods  Demonstration    Comprehension  Returned demonstration;Verbalized understanding;Verbal cues required          OT Long Term Goals - 12/03/19 1619      OT LONG TERM GOAL #1   Title  Pt. will increase left shoulder flexion PROM by 20 degrees to assist with UE dressing    Baseline  Pt. continues to present with limited left shoulder PROM    Time  12    Period   Weeks    Status  On-going    Target Date  02/25/20      OT LONG TERM GOAL #2   Title  Pt. will be able to actively stabilize left shoulder for 10 seconds in supine with shoulder flexed to 90 degrees, and elbow in extension in preparation for functional reaching.    Baseline  Pt. continues to be unable to stabilize the left shoulder. Pt. conitnues to activate intermittent muscle responses.    Time  12    Period  Weeks    Status  On-going    Target Date  02/25/20      OT LONG TERM GOAL #3   Title  Pt. will independently perform hand to face patterns with the LUE in preparation for light self grooming tasks.    Baseline  Pt. is able to initiate performing hand to face patterns , and is able to reach her face, however is unable to sustain, and use her hand at her face..    Time  12    Period  Weeks    Status  On-going    Target Date  02/25/20      OT LONG TERM GOAL #4   Title  Pt. will initiate 10 degrees of active left wrist extension  in preparation for actively lifting her hand off of a surface.    Baseline  Pt. is improving with  wrst activate wrist extension with facilitation    Time  12    Period  Weeks    Status  On-going    Target Date  12/01/19      OT LONG TERM GOAL #5   Title  Pt. will initiate left hand digit extension in order to be able to actively release an objects during ADL tasks.    Baseline  Pt. presents with intermittent digit extension.    Time  12    Period  Weeks    Status  On-going    Target Date  02/25/20      OT LONG TERM GOAL #6   Title  Pt. increase left hand digit flexion in order to be able to hold her toothbrush while applying toothpaste with her left right hand.    Baseline  Pt. is initiating activating gross digit flexors.    Time  12    Period  Weeks    Status  On-going    Target Date  12/01/19            Plan - 12/03/19 1614    Clinical Impression Statement  p    OT Occupational  Profile and History  Detailed Assessment- Review of  Records and additional review of physical, cognitive, psychosocial history related to current functional performance    Occupational performance deficits (Please refer to evaluation for details):  ADL's;IADL's    Rehab Potential  Good    Clinical Decision Making  Several treatment options, min-mod task modification necessary    Comorbidities Affecting Occupational Performance:  May have comorbidities impacting occupational performance    Modification or Assistance to Complete Evaluation   Min-Moderate modification of tasks or assist with assess necessary to complete eval    OT Frequency  2x / week    OT Duration  12 weeks    OT Treatment/Interventions  Self-care/ADL training;Moist Heat;DME and/or AE instruction;Neuromuscular education;Therapeutic exercise;Therapeutic activities;Energy conservation;Patient/family education    Consulted and Agree with Plan of Care  Patient       Patient will benefit from skilled therapeutic intervention in order to improve the following deficits and impairments:           Visit Diagnosis: Muscle weakness (generalized)    Problem List Patient Active Problem List   Diagnosis Date Noted  . E. coli UTI   . Diabetes mellitus type 2 in obese ([place])   . Chronic pain of right knee   . 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 Hemiparesis affecting left side as late effect of stroke ([place])   . Acute ischemic right middle cerebral artery (MCA) stroke ([place]) 07/22/2019  . Carotid artery dissection  (H[facility]) s/p stent placement 07/21/2019  . Diabetes mellitus type II, uncontrolled ([place]) 07/21/2019  . Acute blood loss anemia 07/21/2019  . Leukocytosis 07/21/2019  . Stroke (cerebrum) ([place]) - R MCA infarct s/p tenecteplase and mechanical thrombectomy w/ d/t ICA dissection  07/15/2019  . Middle cerebral artery embolism, right 07/15/2019  . Controlled type 2 diabetes mellitus without complication, without long-term current use of insulin ([place]) 05/28/2017  .  Attention deficit hyperactivity disorder (ADHD), combined type 04/18/2017  . Morbid obesity ([place]) 01/25/2016  . Umbilical hernia [id number]  . Hiatal hernia 12/08/2015  . History of Clostridium difficile colitis 06/19/2014  . HTN (hypertension) 12/23/2013  . PCOS (polycystic ovarian syndrome) 12/23/2013  . Bariatric surgery status 01/28/2013    Harrel Carina, MS, OTR/L 12/03/2019, 4:30 PM  [place] MAIN [facility] SERVICES [address] [place], [place], [address] Phone: [phone]   Fax:  [phone]  Name: Kathryn Coffey MRN: [id number] Date of Birth: Oct 04, 1970

## 2019-12-08 ENCOUNTER — Ambulatory Visit: Payer: No Typology Code available for payment source

## 2019-12-08 ENCOUNTER — Ambulatory Visit: Payer: No Typology Code available for payment source | Admitting: Occupational Therapy

## 2019-12-10 ENCOUNTER — Encounter: Payer: Self-pay | Admitting: Occupational Therapy

## 2019-12-10 ENCOUNTER — Ambulatory Visit: Payer: No Typology Code available for payment source | Admitting: Occupational Therapy

## 2019-12-10 ENCOUNTER — Other Ambulatory Visit: Payer: Self-pay

## 2019-12-10 DIAGNOSIS — M6281 Muscle weakness (generalized): Secondary | ICD-10-CM

## 2019-12-10 NOTE — Therapy (Signed)
[place] MAIN [facility] SERVICES [address] [place], [place], [address] Phone: [phone]   Fax:  [phone]  Occupational Therapy Progress Note  Dates of reporting period  09/08/2019   to   12/10/2019  Patient Details  Name: Kathryn Coffey MRN: [id number] Date of Birth: 14-Feb-1971 Referring Provider (OT): Raulkar   Encounter Date: 12/10/2019  OT End of Session - 12/10/19 1613    Visit Number  20    Number of Visits  24    Date for OT Re-Evaluation  02/25/20    Authorization Type  Progress report period starting  on 09/08/2019    Authorization Time Period  FOTO: 23    OT Start Time  1515    OT Stop Time  1600    OT Time Calculation (min)  45 min    Activity Tolerance  Patient tolerated treatment well    Behavior During Therapy  [facility] for tasks assessed/performed       Past Medical History:  Diagnosis Date  . Abdominal wall cellulitis 2013 and 2014  . Asthma    allergic to grasses  . B12 deficiency   . Complication of anesthesia   . Costochondritis   . Diabetes mellitus without complication ([place])   . Gastric anomaly    multiple small ulcers  . GERD (gastroesophageal reflux disease)   . Herpes 06/2018   POSSIBLY IN EYE- PT TAKING VALTREX AND WILL SEE OPTHAMOLOGIST TO SEE IF VALTREX IS WORKING  . History of abnormal cervical Pap smear   . History of Clostridium difficile colitis   . History of hiatal hernia   . History of kidney stones   . Hypertension   . IBS (irritable bowel syndrome)   . Migraine   . Morbid obesity ([place])    s/p attempted gastric banding now decompressed  . PCOS (polycystic ovarian syndrome)   . PONV (postoperative nausea and vomiting)    WITH SPINAL ONLY  . Stroke [facility])     Past Surgical History:  Procedure Laterality Date  . BREAST EXCISIONAL BIOPSY Left    cyst excision - Dr. Patty Sermons  . CESAREAN SECTION    . CHOLECYSTECTOMY    . COLONOSCOPY    . EAR CYST EXCISION Right 07/04/2018   Procedure:  EXCISION GLOMUS TUMOR THUMBNAIL;  Surgeon: Corky Mull, MD;  Location: [facility] ORS;  Service: Orthopedics;  Laterality: Right;  . ESOPHAGOGASTRODUODENOSCOPY    . ESOPHAGOGASTRODUODENOSCOPY (EGD) WITH PROPOFOL N/A 12/06/2015   Procedure: ESOPHAGOGASTRODUODENOSCOPY (EGD) WITH PROPOFOL;  Surgeon: Manya Silvas, MD;  Location: [facility] ENDOSCOPY;  Service: Endoscopy;  Laterality: N/A;  . IR ANGIO INTRA EXTRACRAN SEL INTERNAL CAROTID UNI L MOD SED  07/15/2019  . IR ANGIO VERTEBRAL SEL SUBCLAVIAN INNOMINATE UNI R MOD SED  07/15/2019  . IR CT HEAD LTD  07/15/2019  . IR INTRAVSC STENT CERV CAROTID W/O EMB-PROT MOD SED INC ANGIO  07/15/2019  . IR PERCUTANEOUS ART THROMBECTOMY/INFUSION INTRACRANIAL INC DIAG ANGIO  07/15/2019  . LAPAROSCOPIC GASTRIC BANDING    . LAPAROSCOPIC GASTRIC RESTRICTIVE DUODENAL PROCEDURE (DUODENAL SWITCH) N/A 01/25/2016   Procedure: LAPAROSCOPIC GASTRIC RESTRICTIVE DUODENAL PROCEDURE (DUODENAL SWITCH);  Surgeon: Ladora Daniel, MD;  Location: [facility] ORS;  Service: General;  Laterality: N/A;  . OVARY SURGERY     x2  . RADIOLOGY WITH ANESTHESIA N/A 07/15/2019   Procedure: IR WITH ANESTHESIA;  Surgeon: Luanne Bras, MD;  Location: [place];  Service: Radiology;  Laterality: N/A;  . TONSILLECTOMY    .  TUBAL LIGATION    . UMBILICAL HERNIA REPAIR N/A 01/25/2016   Procedure: LAPAROSCOPIC UMBILICAL HERNIA;  Surgeon: Ladora Daniel, MD;  Location: [facility] ORS;  Service: General;  Laterality: N/A;  . UMBILICAL HERNIA REPAIR N/A 10/20/2016   Procedure: HERNIA REPAIR UMBILICAL ADULT;  Surgeon: Leonie Green, MD;  Location: [facility] ORS;  Service: General;  Laterality: N/A;    There were no vitals filed for this visit.   OT Treatment  Neuro muscular re-education:  Pt.tolerated weightbearing, and proprioceptive awareness through the LUE, and handwhile seated at the mat. Pt.continues to requireassist to position the LUE into position for weightbearing. Pt. also worked on alternating  weightbearing with functional reaching, and graspingforcups, andcones, and placing them onto an elevatedflat tabletopsurfaWeightbearing, and proprioceptive input was performed in preparation for ROM, and facilitation of active movement.  Therapeutic Exercise:  Pt. worked on left shoulder stabilization exercises in supine with the shoulder flexed to 90 degrees, and the elbow in extension. Pt. was able toinitiate active shoulder stabilization consistently 3x's for 5 sec. each.Assist was also provided at the elbow to assist in maintaining elbow in extension for protraction.Pt.worked on hand to face patterns with support at the elbow, and handwith combinations of movement, and wrist extension.Pt. was able to perform elbow extension exercise against gravity.Pt. Worked on AAROMwhile seated at the tabletop with a 35 degree incline against gravity.Pt.worked on wrist, and digit extension with facilitation, andalternating weightbearing. Pt. worked on active supination with support at the forearm.   Pt. Reports being sick with allergies over the weekend, and Monday after being outside for her son football practice. Pt.continues to [facility] progress with shoulder AROM, hand to face patterns, and wrist, and digit extension. Pt. was able to achieve active shoulder stabilization for 3 reps, 5 sec. each. Pt.has improved with wrist extension, and digit extension with facilitation, and alternating weightbearing. Pt.continues to be able to initiateminimal 2nddigit extension, and 5th digit extensionfrom the tabletop surface today.Pt.is able to consistently initiate active left shoulder horizontal abduction, elbow flexion, and extension, forearm supination, wrist extension, digit extension with facilitation, and thumb abduction.Pt. continues torequirework on improvingshoulder stabilization, wrist, and digitmovements for hand to face patterns, and to increase LUE engagement during ADLs, and  IADL tasks.                    OT Education - 12/10/19 1613    Education Details  Left UE  functioning    Person(s) Educated  Patient;Spouse    Comprehension  Returned demonstration;Verbalized understanding;Verbal cues required          OT Long Term Goals - 12/10/19 1619      OT LONG TERM GOAL #1   Title  Pt. will increase left shoulder flexion PROM by 20 degrees to assist with UE dressing    Baseline  Shoulder flexion sitting 38 degrees, supine: 18(135).Pt. continues to present with limited left shoulder PROM    Time  12    Period  Weeks    Status  On-going    Target Date  02/25/20      OT LONG TERM GOAL #2   Title  Pt. will be able to actively stabilize left shoulder for 10 seconds in supine with shoulder flexed to 90 degrees, and elbow in extension in preparation for functional reaching.    Baseline  Pt. was able to initate shoulder stabilization with her shoulder flexed to 90 degrees, and her elbow extended for 5 sec. each for 3  reps. Pt. continues  to activate intermittent muscle responses.    Time  12    Period  Weeks    Status  On-going    Target Date  02/25/20      OT LONG TERM GOAL #3   Title  Pt. will independently perform hand to face patterns with the LUE in preparation for light self grooming tasks.    Baseline  Pt. is able to initiate performing hand to face patterns, and is able to reach her face, however is unable to sustain, and use her hand at her face.    Time  12    Period  Weeks    Status  On-going    Target Date  02/25/20      OT LONG TERM GOAL #4   Title  Pt. will initiate 10 degrees of active left wrist extension  in preparation for actively lifting her hand off of a surface.    Baseline  Wrist extension: 5(60)    Time  12    Period  Weeks    Status  On-going    Target Date  02/25/20      OT LONG TERM GOAL #5   Title  Pt. will initiate left hand digit extension in order to be able to actively release an objects during ADL tasks.     Baseline  Pt. presents with consistent 2nd, and 5th digit extension.    Time  12    Period  Weeks    Status  On-going    Target Date  02/25/20      OT LONG TERM GOAL #6   Title  Pt. increase left hand digit flexion in order to be able to hold her toothbrush while applying toothpaste with her left right hand.    Baseline  Pt. is initiating activating gross digit flexors.    Time  12    Period  Weeks    Status  On-going    Target Date  02/25/20            Plan - 12/10/19 1613    Clinical Impression Statement Pt. Reports being sick with allergies over the weekend, and Monday after being outside for her son football practice. Pt.continues to [facility] progress with shoulder AROM, hand to face patterns, and wrist, and digit extension. Pt. was able to achieve active shoulder stabilization for 3 reps, 5 sec. each. Pt.has improved with wrist extension, and digit extension with facilitation, and alternating weightbearing. Pt.continues to be able to initiateminimal 2nddigit extension, and 5th digit extensionfrom the tabletop surface today.Pt.is able to consistently initiate active left shoulder horizontal abduction, elbow flexion, and extension, forearm supination, wrist extension, digit extension with facilitation, and thumb abduction.Pt. continues torequirework on improvingshoulder stabilization, wrist, and digitmovements for hand to face patterns, and to increase LUE engagement during ADLs, and IADL tasks.    OT Occupational Profile and History  Detailed Assessment- Review of Records and additional review of physical, cognitive, psychosocial history related to current functional performance    Occupational performance deficits (Please refer to evaluation for details):  ADL's;IADL's    Rehab Potential  Good    Clinical Decision Making  Several treatment options, min-mod task modification necessary    Comorbidities Affecting Occupational Performance:  May have comorbidities  impacting occupational performance    Modification or Assistance to Complete Evaluation   Min-Moderate modification of tasks or assist with assess necessary to complete eval    OT Frequency  2x / week    OT Duration  12  weeks    OT Treatment/Interventions  Self-care/ADL training;Moist Heat;DME and/or AE instruction;Neuromuscular education;Therapeutic exercise;Therapeutic activities;Energy conservation;Patient/family education    Consulted and Agree with Plan of Care  Patient       Patient will benefit from skilled therapeutic intervention in order to improve the following deficits and impairments:           Visit Diagnosis: Muscle weakness (generalized)    Problem List Patient Active Problem List   Diagnosis Date Noted  . E. coli UTI   . Diabetes mellitus type 2 in obese ([place])   . Chronic pain of right knee   . 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 Hemiparesis affecting left side as late effect of stroke ([place])   . Acute ischemic right middle cerebral artery (MCA) stroke ([place]) 07/22/2019  . Carotid artery dissection  (HCC) s/p stent placement 07/21/2019  . Diabetes mellitus type II, uncontrolled ([place]) 07/21/2019  . Acute blood loss anemia 07/21/2019  . Leukocytosis 07/21/2019  . Stroke (cerebrum) ([place]) - R MCA infarct s/p tenecteplase and mechanical thrombectomy w/ d/t ICA dissection  07/15/2019  . Middle cerebral artery embolism, right 07/15/2019  . Controlled type 2 diabetes mellitus without complication, without long-term current use of insulin ([place]) 05/28/2017  . Attention deficit hyperactivity disorder (ADHD), combined type 04/18/2017  . Morbid obesity ([place]) 01/25/2016  . Umbilical hernia [id number]  . Hiatal hernia 12/08/2015  . History of Clostridium difficile colitis 06/19/2014  . HTN (hypertension) 12/23/2013  . PCOS (polycystic ovarian syndrome) 12/23/2013  . Bariatric surgery status 01/28/2013    Harrel Carina, MS, OTR/L 12/10/2019, 4:26  PM  [place] MAIN [facility] SERVICES [address] [place], [place], [address] Phone: [phone]   Fax:  [phone]  Name: Kathryn Coffey MRN: [id number] Date of Birth: Jul 17, 1971

## 2019-12-13 ENCOUNTER — Other Ambulatory Visit: Payer: Self-pay

## 2019-12-15 ENCOUNTER — Ambulatory Visit: Payer: No Typology Code available for payment source

## 2019-12-15 ENCOUNTER — Other Ambulatory Visit: Payer: Self-pay

## 2019-12-15 ENCOUNTER — Encounter: Payer: Self-pay | Admitting: Occupational Therapy

## 2019-12-15 ENCOUNTER — Ambulatory Visit: Payer: No Typology Code available for payment source | Admitting: Occupational Therapy

## 2019-12-15 DIAGNOSIS — M6281 Muscle weakness (generalized): Secondary | ICD-10-CM | POA: Diagnosis not present

## 2019-12-15 DIAGNOSIS — R262 Difficulty in walking, not elsewhere classified: Secondary | ICD-10-CM

## 2019-12-15 DIAGNOSIS — R278 Other lack of coordination: Secondary | ICD-10-CM

## 2019-12-15 MED ORDER — TOPIRAMATE 25 MG PO TABS: 25.0000 mg | ORAL_TABLET | Freq: Every day | ORAL | 0 refills | Status: DC

## 2019-12-15 NOTE — Therapy (Signed)
[place] MAIN [facility] SERVICES [address] [place], [place], [address] Phone: [phone]   Fax:  [phone]  Physical Therapy Treatment  Patient Details  Name: Kathryn Coffey MRN: [id number] Date of Birth: 1971/02/23 No data recorded  Encounter Date: 12/15/2019  PT End of Session - 12/15/19 1529    Visit Number  15    Number of Visits  49    Date for PT Re-Evaluation  01/26/20    PT Start Time  1430    PT Stop Time  1516    PT Time Calculation (min)  46 min    Equipment Utilized During Treatment  Gait belt    Activity Tolerance  Patient tolerated treatment well    Behavior During Therapy  WFL for tasks assessed/performed       Past Medical History:  Diagnosis Date  . Abdominal wall cellulitis 2013 and 2014  . Asthma    allergic to grasses  . B12 deficiency   . Complication of anesthesia   . Costochondritis   . Diabetes mellitus without complication ([place])   . Gastric anomaly    multiple small ulcers  . GERD (gastroesophageal reflux disease)   . Herpes 06/2018   POSSIBLY IN EYE- PT TAKING VALTREX AND WILL SEE OPTHAMOLOGIST TO SEE IF VALTREX IS WORKING  . History of abnormal cervical Pap smear   . History of Clostridium difficile colitis   . History of hiatal hernia   . History of kidney stones   . Hypertension   . IBS (irritable bowel syndrome)   . Migraine   . Morbid obesity ([place])    s/p attempted gastric banding now decompressed  . PCOS (polycystic ovarian syndrome)   . PONV (postoperative nausea and vomiting)    WITH SPINAL ONLY  . Stroke [facility])     Past Surgical History:  Procedure Laterality Date  . BREAST EXCISIONAL BIOPSY Left    cyst excision - Dr. Patty Sermons  . CESAREAN SECTION    . CHOLECYSTECTOMY    . COLONOSCOPY    . EAR CYST EXCISION Right 07/04/2018   Procedure: EXCISION GLOMUS TUMOR THUMBNAIL;  Surgeon: Corky Mull, MD;  Location: [facility] ORS;  Service: Orthopedics;  Laterality: Right;  .  ESOPHAGOGASTRODUODENOSCOPY    . ESOPHAGOGASTRODUODENOSCOPY (EGD) WITH PROPOFOL N/A 12/06/2015   Procedure: ESOPHAGOGASTRODUODENOSCOPY (EGD) WITH PROPOFOL;  Surgeon: Manya Silvas, MD;  Location: [facility] ENDOSCOPY;  Service: Endoscopy;  Laterality: N/A;  . IR ANGIO INTRA EXTRACRAN SEL INTERNAL CAROTID UNI L MOD SED  07/15/2019  . IR ANGIO VERTEBRAL SEL SUBCLAVIAN INNOMINATE UNI R MOD SED  07/15/2019  . IR CT HEAD LTD  07/15/2019  . IR INTRAVSC STENT CERV CAROTID W/O EMB-PROT MOD SED INC ANGIO  07/15/2019  . IR PERCUTANEOUS ART THROMBECTOMY/INFUSION INTRACRANIAL INC DIAG ANGIO  07/15/2019  . LAPAROSCOPIC GASTRIC BANDING    . LAPAROSCOPIC GASTRIC RESTRICTIVE DUODENAL PROCEDURE (DUODENAL SWITCH) N/A 01/25/2016   Procedure: LAPAROSCOPIC GASTRIC RESTRICTIVE DUODENAL PROCEDURE (DUODENAL SWITCH);  Surgeon: Ladora Daniel, MD;  Location: [facility] ORS;  Service: General;  Laterality: N/A;  . OVARY SURGERY     x2  . RADIOLOGY WITH ANESTHESIA N/A 07/15/2019   Procedure: IR WITH ANESTHESIA;  Surgeon: Luanne Bras, MD;  Location: [place];  Service: Radiology;  Laterality: N/A;  . TONSILLECTOMY    . TUBAL LIGATION    . UMBILICAL HERNIA REPAIR N/A 01/25/2016   Procedure: LAPAROSCOPIC UMBILICAL HERNIA;  Surgeon: Ladora Daniel, MD;  Location: [facility] ORS;  Service: General;  Laterality: N/A;  . UMBILICAL HERNIA REPAIR N/A 10/20/2016   Procedure: HERNIA REPAIR UMBILICAL ADULT;  Surgeon: Leonie Green, MD;  Location: [facility] ORS;  Service: General;  Laterality: N/A;    There were no vitals filed for this visit.  Subjective Assessment - 12/15/19 1528    Subjective  Patient reports she is doing well today. No resting pain reported upon arrival. She has still been going to the gym with her husband. She is wearing her knee brace on the L side which has helped with her hyperextension. Compliant with HEP. She continues to have minimal function out of LUE but no resting pain at this time. No specific questions or  concerns currently.    Pertinent History  Patient was in the hospital [place] and then in patient rehab 3 weeks. She was dicharged 08/13/19 and then HHPT and was discharged 09/03/19. She has not had any falls.    Currently in Pain?  No/denies        TREATMENT   Ther-ex NuStep L2 x 5 minutes for warm-up BUE/BLE, utilized NuStep LUE strap to keep hand on handles Standing double leg heel raises x 20; Standing mini squats with RUE support in // bars x 15;  Standing exercises with 3# ankle weights BLE: Hip flexion marches x 20; Hip abduction x 20; HS curls x 20; Hip extension x 20;  Seated LAQ with 3# ankle weights BLE x 20; Sit to stand with RLE on 6" step to bias LLE, no UE assist x 10; Standing L TKE with green tband x 20;  6" alternating step-ups x 10 on each side; Matrix resisted gait 12.5# forward/backward/R lateral/L lateral x 2 each direction;   Pt educated throughout session about proper posture and technique with exercises. Improved exercise technique, movement at target joints, use of target muscles after min to mod verbal, visual, tactile cues.   Pt demonstrates excellent motivation during sessiontoday. Performed exercises today in standing or sitting as opposed to supine/hooklying. Initiated resisted gait and pt overall continues to appear more stable while she is wearing her L knee brace.Pt encouraged to continue HEP. Pt will benefit from PT services to address deficits in strength, balance, and mobility in order to return to full function at home.                          PT Short Term Goals - 11/03/19 1356      PT SHORT TERM GOAL #1   Title  Patient will be independent in home exercise program to improve strength/mobility for better functional independence with ADLs.    Baseline  11/03/19: Pt performing HEP at least 5d/wk    Time  6    Period  Weeks    Status  Partially Met    Target Date  12/15/19      PT SHORT TERM GOAL #2    Title  Patient (> 12 years old) will complete five times sit to stand test in < 15 seconds indicating an increased LE strength and improved balance.    Baseline  eval: 15.28s; 11/03/19: 12.2s    Time  6    Period  Weeks    Status  Achieved    Target Date  10/20/19        PT Long Term Goals - 11/03/19 1359      PT LONG TERM GOAL #1   Title  Patient will increase Berg Balance score by >  6 points to demonstrate decreased fall risk during functional activities.    Baseline  09/08/19: 48/56; 11/03/19: 51/56    Time  12    Period  Weeks    Status  Partially Met    Target Date  01/26/20      PT LONG TERM GOAL #2   Title  Patient will increase six minute walk test distance to >1200 for progression to community ambulator and improve gait ability    Baseline  09/08/19: 525', 11/03/19: 817';    Time  12    Period  Weeks    Status  Partially Met    Target Date  01/26/20      PT LONG TERM GOAL #3   Title  Patient will increase 10 meter walk test to >1.27ms as to improve gait speed for better community ambulation and to reduce fall risk    Baseline  09/08/19:  0.51 m/s; 11/03/19: self-selected: 13.2s = 0.76 m/s, fastest: 12.5s = 0.8 m/s    Time  12    Period  Weeks    Status  Partially Met    Target Date  01/26/20            Plan - 12/15/19 1529    Clinical Impression Statement  Pt demonstrates excellent motivation during session today. Performed exercises today in standing or sitting as opposed to supine/hooklying. Initiated resisted gait and pt overall continues to appear more stable while she is wearing her L knee brace. Pt encouraged to continue HEP. Pt will benefit from PT services to address deficits in strength, balance, and mobility in order to return to full function at home.    Personal Factors and Comorbidities  Comorbidity 1    Comorbidities  diabetes, high blood pressure, reflux,    Examination-Activity Limitations  Carry;Caring for Others;Bend;Locomotion Level;Self  Feeding;Sleep;Toileting    Examination-Participation Restrictions  Cleaning;Community Activity;Driving;Laundry;Shop    Stability/Clinical Decision Making  Stable/Uncomplicated    Rehab Potential  Good    PT Frequency  2x / week    PT Duration  12 weeks    PT Treatment/Interventions  Manual techniques;Functional mobility training;Stair training;Gait training;Electrical Stimulation;Moist Heat;Ultrasound;Therapeutic exercise;Balance training;Neuromuscular re-education    PT Next Visit Plan  strengthening and balance    PT Home Exercise Plan  standing hip abd,    Consulted and Agree with Plan of Care  Patient       Patient will benefit from skilled therapeutic intervention in order to improve the following deficits and impairments:  Abnormal gait, Decreased knowledge of use of DME, Dizziness, Pain, Decreased coordination, Impaired UE functional use, Decreased strength, Decreased endurance, Decreased activity tolerance, Decreased balance, Difficulty walking, Obesity  Visit Diagnosis: Muscle weakness (generalized)  Difficulty in walking, not elsewhere classified     Problem List Patient Active Problem List   Diagnosis Date Noted  . E. coli UTI   . Diabetes mellitus type 2 in obese ([place]   . Chronic pain of right knee   . Flaccid monoplegia of upper extremity ([place]   . Hemiparesis affecting left side as late effect of stroke ([place]   . Acute ischemic right middle cerebral artery (MCA) stroke ([place] 07/22/2019  . Carotid artery dissection  (HCC) s/p stent placement 07/21/2019  . Diabetes mellitus type II, uncontrolled ([place] 07/21/2019  . Acute blood loss anemia 07/21/2019  . Leukocytosis 07/21/2019  . Stroke ([place]br[place]) ([place] - R MCA infarct s/p tenecteplase and mechanical thrombectomy w/ d/t ICA dissection  07/15/2019  . Middle cerebral artery embolism,  right 07/15/2019  . Controlled type 2 diabetes mellitus without complication, without long-term current use of insulin ([place]) 05/28/2017   . Attention deficit hyperactivity disorder (ADHD), combined type 04/18/2017  . Morbid obesity ([place]) 01/25/2016  . Umbilical hernia 76/16/0737  . Hiatal hernia 12/08/2015  . History of Clostridium difficile colitis 06/19/2014  . HTN (hypertension) 12/23/2013  . PCOS (polycystic ovarian syndrome) 12/23/2013  . Bariatric surgery status 01/28/2013   Phillips Grout PT, DPT, GCS  Dave Mannes 12/15/2019, 3:34 PM  [place] MAIN [facility] SERVICES [address] [place], [place], [address] Phone: [phone]   Fax:  [phone]  Name: Kathryn Coffey MRN: [id number] Date of Birth: 1970-11-08

## 2019-12-15 NOTE — Therapy (Signed)
[place] MAIN [facility] SERVICES [address] [place], [place], [address] Phone: [phone]   Fax:  [phone]  Occupational Therapy Treatment  Patient Details  Name: Kathryn Coffey MRN: [id number] Date of Birth: [birth year] Referring Provider (OT): Raulkar   Encounter Date: 12/15/2019  OT End of Session - 12/15/19 1757    Visit Number  21    Number of Visits  24    Date for OT Re-Evaluation  02/25/20    Authorization Type  Progress report period starting  on 09/08/2019    Authorization Time Period  FOTO    OT Start Time  1518    OT Stop Time  1600    OT Time Calculation (min)  42 min    Activity Tolerance  Patient tolerated treatment well    Behavior During Therapy  [facility] for tasks assessed/performed       Past Medical History:  Diagnosis Date  . Abdominal wall cellulitis 2013 and 2014  . Asthma    allergic to grasses  . B12 deficiency   . Complication of anesthesia   . Costochondritis   . Diabetes mellitus without complication ([place])   . Gastric anomaly    multiple small ulcers  . GERD (gastroesophageal reflux disease)   . Herpes 06/2018   POSSIBLY IN EYE- PT TAKING VALTREX AND WILL SEE OPTHAMOLOGIST TO SEE IF VALTREX IS WORKING  . History of abnormal cervical Pap smear   . History of Clostridium difficile colitis   . History of hiatal hernia   . History of kidney stones   . Hypertension   . IBS (irritable bowel syndrome)   . Migraine   . Morbid obesity ([place])    s/p attempted gastric banding now decompressed  . PCOS (polycystic ovarian syndrome)   . PONV (postoperative nausea and vomiting)    WITH SPINAL ONLY  . Stroke [facility])     Past Surgical History:  Procedure Laterality Date  . BREAST EXCISIONAL BIOPSY Left    cyst excision - Dr. Patty Sermons  . CESAREAN SECTION    . CHOLECYSTECTOMY    . COLONOSCOPY    . EAR CYST EXCISION Right 07/04/2018   Procedure: EXCISION GLOMUS TUMOR THUMBNAIL;  Surgeon: Corky Mull, MD;   Location: [facility] ORS;  Service: Orthopedics;  Laterality: Right;  . ESOPHAGOGASTRODUODENOSCOPY    . ESOPHAGOGASTRODUODENOSCOPY (EGD) WITH PROPOFOL N/A 12/06/2015   Procedure: ESOPHAGOGASTRODUODENOSCOPY (EGD) WITH PROPOFOL;  Surgeon: Manya Silvas, MD;  Location: [facility] ENDOSCOPY;  Service: Endoscopy;  Laterality: N/A;  . IR ANGIO INTRA EXTRACRAN SEL INTERNAL CAROTID UNI L MOD SED  07/15/2019  . IR ANGIO VERTEBRAL SEL SUBCLAVIAN INNOMINATE UNI R MOD SED  07/15/2019  . IR CT HEAD LTD  07/15/2019  . IR INTRAVSC STENT CERV CAROTID W/O EMB-PROT MOD SED INC ANGIO  07/15/2019  . IR PERCUTANEOUS ART THROMBECTOMY/INFUSION INTRACRANIAL INC DIAG ANGIO  07/15/2019  . LAPAROSCOPIC GASTRIC BANDING    . LAPAROSCOPIC GASTRIC RESTRICTIVE DUODENAL PROCEDURE (DUODENAL SWITCH) N/A 01/25/2016   Procedure: LAPAROSCOPIC GASTRIC RESTRICTIVE DUODENAL PROCEDURE (DUODENAL SWITCH);  Surgeon: Ladora Daniel, MD;  Location: [facility] ORS;  Service: General;  Laterality: N/A;  . OVARY SURGERY     x2  . RADIOLOGY WITH ANESTHESIA N/A 07/15/2019   Procedure: IR WITH ANESTHESIA;  Surgeon: Luanne Bras, MD;  Location: [place];  Service: Radiology;  Laterality: N/A;  . TONSILLECTOMY    . TUBAL LIGATION    . UMBILICAL HERNIA REPAIR N/A 01/25/2016   Procedure:  LAPAROSCOPIC UMBILICAL HERNIA;  Surgeon: Ladora Daniel, MD;  Location: [facility] ORS;  Service: General;  Laterality: N/A;  . UMBILICAL HERNIA REPAIR N/A 10/20/2016   Procedure: HERNIA REPAIR UMBILICAL ADULT;  Surgeon: Leonie Green, MD;  Location: [facility] ORS;  Service: General;  Laterality: N/A;    There were no vitals filed for this visit.  Subjective Assessment - 12/15/19 1757    Subjective   Pt. reports fatigue from working, and doing errands.    Patient is accompanied by:  Family member    Pertinent History  Per pt. chart. Pt. is a 49 y.o. female with history of HTN, T2DM, obesity who was admitted on 07/15/19 with onset of HA progressing to flaccid left hemiparesis a few  hours later.  CT head showed acute nonhemorrhagic infarct in right basal ganglia with hyperdense right MCA sign.  CT A/P head neck showed perfusion deficit with occluded right ICA and slow flow in M1.  She underwent cerebral angiogram with revascularization of right MCA with T1C1 revascularization and repair of right ICA with flow diverter device. Stroke felt to be embolic likely due to ICA dissection. Postprocedure head CT was negative for bleed repeat CTA head neck showed reocclusion of right ICA origin and occlusion of right ICA stent but now with patent right MCA. Pt. received inpatient rehab services for 3 weeks, and home health services.    Currently in Pain?  No/denies      OT TREATMENT    Neuro muscular re-education:  Pt. worked on grasping, placing and stacking [place] style discs at the tabletop surface. Emphasis was placed on extending her digits off of the discs to release them.  Therapeutic Exercise:  Pt. worked on left shoulder stabilization exercises in supine with the shoulder flexed to 90 degrees, and the elbow in extension. Pt. was able toinitiate active shoulder stabilization responses minimally with support provided proximally with assist in maintaining elbow in extension. Pt. Was able to actively stabilize her shoulder for 1 rep of  30 sec, followed by 1 rep of 15 sec.Pt. was able to perform scapular protraction with support provided at the elbow, and forearm.  Pt. worked on [employer] shoulder flexion in sidelying for a gravity eliminated plane using the UE Ranger.  Pt.has been working on shoulder stabilization exercises at home. Pt. was able to stabilize her shoulder over this past weekend when working on exercises with her husband. Pt.continues to makesteady progress.Pt.continues to be able to able to activate wrist extension, as well as activate digit extension with facilitation, and alternating weightbearing. Pt.however, wasable to initiateminimal 2nddigit  extension, and 5th digit extensionfrom the tabletop surface today.Pt.is able to consistently initiate active left shoulder horizontal abduction, elbow flexion, and extension, forearm supination, wrist extension, digit extension with facilitation, and thumb abduction. Pt. Continues to work on improving her ability to actively release objects.Pt. continues torequirework on improvingshoulder stabilization, wrist, and digitmovements for hand to face patterns, and to increase LUE engagement during ADLs, and IADL tasks.                         OT Education - 12/15/19 1757    Education Details  Left UE  functioning    Person(s) Educated  Patient;Spouse    Methods  Demonstration    Comprehension  Returned demonstration;Verbalized understanding;Verbal cues required          OT Long Term Goals - 12/10/19 1619      OT LONG TERM GOAL #1  Title  Pt. will increase left shoulder flexion PROM by 20 degrees to assist with UE dressing    Baseline  Shoulder flexion sitting 38 degrees, supine: 18(135).Pt. continues to present with limited left shoulder PROM    Time  12    Period  Weeks    Status  On-going    Target Date  02/25/20      OT LONG TERM GOAL #2   Title  Pt. will be able to actively stabilize left shoulder for 10 seconds in supine with shoulder flexed to 90 degrees, and elbow in extension in preparation for functional reaching.    Baseline  Pt. was able to initate shoulder stabilization with her shoulder flexed to 90 degrees, and her elbow extended for 5 sec. each for 3  reps. Pt. continues to activate intermittent muscle responses.    Time  12    Period  Weeks    Status  On-going    Target Date  02/25/20      OT LONG TERM GOAL #3   Title  Pt. will independently perform hand to face patterns with the LUE in preparation for light self grooming tasks.    Baseline  Pt. is able to initiate performing hand to face patterns, and is able to reach her face, however is  unable to sustain, and use her hand at her face.    Time  12    Period  Weeks    Status  On-going    Target Date  02/25/20      OT LONG TERM GOAL #4   Title  Pt. will initiate 10 degrees of active left wrist extension  in preparation for actively lifting her hand off of a surface.    Baseline  Wrist extension: 5(60)    Time  12    Period  Weeks    Status  On-going    Target Date  02/25/20      OT LONG TERM GOAL #5   Title  Pt. will initiate left hand digit extension in order to be able to actively release an objects during ADL tasks.    Baseline  Pt. presents with consistent 2nd, and 5th digit extension.    Time  12    Period  Weeks    Status  On-going    Target Date  02/25/20      OT LONG TERM GOAL #6   Title  Pt. increase left hand digit flexion in order to be able to hold her toothbrush while applying toothpaste with her left right hand.    Baseline  Pt. is initiating activating gross digit flexors.    Time  12    Period  Weeks    Status  On-going    Target Date  02/25/20            Plan - 12/15/19 1758    Clinical Impression Statement Pt.has been working on shoulder stabilization exercises at home. Pt. was able to stabilize her shoulder over this past weekend when working on exercises with her husband. Pt.continues to makesteady progress.Pt.continues to be able to able to activate wrist extension, as well as activate digit extension with facilitation, and alternating weightbearing. Pt.however, wasable to initiateminimal 2nddigit extension, and 5th digit extensionfrom the tabletop surface today.Pt.is able to consistently initiate active left shoulder horizontal abduction, elbow flexion, and extension, forearm supination, wrist extension, digit extension with facilitation, and thumb abduction. Pt. Continues to work on improving her ability to actively release objects.Pt. continues torequirework  on improvingshoulder stabilization, wrist, and digitmovements  for hand to face patterns, and to increase LUE engagement during ADLs, and IADL tasks.     OT Occupational Profile and History  Detailed Assessment- Review of Records and additional review of physical, cognitive, psychosocial history related to current functional performance    Occupational performance deficits (Please refer to evaluation for details):  ADL's;IADL's    Rehab Potential  Good    Clinical Decision Making  Several treatment options, min-mod task modification necessary    Comorbidities Affecting Occupational Performance:  May have comorbidities impacting occupational performance    Modification or Assistance to Complete Evaluation   Min-Moderate modification of tasks or assist with assess necessary to complete eval    OT Frequency  2x / week    OT Duration  12 weeks    OT Treatment/Interventions  Self-care/ADL training;Moist Heat;DME and/or AE instruction;Neuromuscular education;Therapeutic exercise;Therapeutic activities;Energy conservation;Patient/family education       Patient will benefit from skilled therapeutic intervention in order to improve the following deficits and impairments:           Visit Diagnosis: Muscle weakness (generalized)    Problem List Patient Active Problem List   Diagnosis Date Noted  . E. coli UTI   . Diabetes mellitus type 2 in obese ([place])   . Chronic pain of right knee   . Flaccid monoplegia of upper extremity ([place])   . Hemiparesis affecting left side as late effect of stroke ([place])   . Acute ischemic right middle cerebral artery (MCA) stroke ([place]) 07/22/2019  . Carotid artery dissection  (H[facility]) s/p stent placement 07/21/2019  . Diabetes mellitus type II, uncontrolled ([place]) 07/21/2019  . Acute blood loss anemia 07/21/2019  . Leukocytosis 07/21/2019  . Stroke (cerebrum) ([place]) - R MCA infarct s/p tenecteplase and mechanical thrombectomy w/ d/t ICA dissection  07/15/2019  . Middle cerebral artery embolism, right 07/15/2019  . Controlled  type 2 diabetes mellitus without complication, without long-term current use of insulin ([place]) 05/28/2017  . Attention deficit hyperactivity disorder (ADHD), combined type 04/18/2017  . Morbid obesity ([place]) 01/25/2016  . Umbilical hernia [id number]  . Hiatal hernia 12/08/2015  . History of Clostridium difficile colitis 06/19/2014  . HTN (hypertension) 12/23/2013  . PCOS (polycystic ovarian syndrome) 12/23/2013  . Bariatric surgery status 01/28/2013    Harrel Carina, MS, OTR/L 12/15/2019, 5:59 PM  [place] MAIN [facility] SERVICES [address] [place], [place], [address] Phone: [phone]   Fax:  [phone]  Name: Kathryn Coffey MRN: [id number] Date of Birth: [birth year]

## 2019-12-17 ENCOUNTER — Other Ambulatory Visit: Payer: Self-pay

## 2019-12-17 ENCOUNTER — Ambulatory Visit: Payer: No Typology Code available for payment source | Admitting: Occupational Therapy

## 2019-12-17 DIAGNOSIS — M6281 Muscle weakness (generalized): Secondary | ICD-10-CM | POA: Diagnosis not present

## 2019-12-17 DIAGNOSIS — R278 Other lack of coordination: Secondary | ICD-10-CM

## 2019-12-18 ENCOUNTER — Encounter: Payer: Self-pay | Admitting: Occupational Therapy

## 2019-12-18 NOTE — Therapy (Signed)
[place] MAIN [facility] SERVICES [address] [place], [place], [address] Phone: [phone]   Fax:  [phone]  Occupational Therapy Treatment  Patient Details  Name: Kathryn Coffey MRN: [id number] Date of Birth: 09/10/1970 Referring Provider (OT): Raulkar   Encounter Date: 12/17/2019  OT End of Session - 12/18/19 0931    Visit Number  22    Number of Visits  74    Date for OT Re-Evaluation  02/25/20    Authorization Type  Progress report period starting  on 09/08/2019    Authorization Time Period  FOTO    OT Start Time  1515    OT Stop Time  1600    OT Time Calculation (min)  45 min    Activity Tolerance  Patient tolerated treatment well    Behavior During Therapy  [facility] for tasks assessed/performed       Past Medical History:  Diagnosis Date  . Abdominal wall cellulitis 2013 and 2014  . Asthma    allergic to grasses  . B12 deficiency   . Complication of anesthesia   . Costochondritis   . Diabetes mellitus without complication ([place])   . Gastric anomaly    multiple small ulcers  . GERD (gastroesophageal reflux disease)   . Herpes 06/2018   POSSIBLY IN EYE- PT TAKING VALTREX AND WILL SEE OPTHAMOLOGIST TO SEE IF VALTREX IS WORKING  . History of abnormal cervical Pap smear   . History of Clostridium difficile colitis   . History of hiatal hernia   . History of kidney stones   . Hypertension   . IBS (irritable bowel syndrome)   . Migraine   . Morbid obesity ([place])    s/p attempted gastric banding now decompressed  . PCOS (polycystic ovarian syndrome)   . PONV (postoperative nausea and vomiting)    WITH SPINAL ONLY  . Stroke [facility])     Past Surgical History:  Procedure Laterality Date  . BREAST EXCISIONAL BIOPSY Left    cyst excision - Dr. Patty Sermons  . CESAREAN SECTION    . CHOLECYSTECTOMY    . COLONOSCOPY    . EAR CYST EXCISION Right 07/04/2018   Procedure: EXCISION GLOMUS TUMOR THUMBNAIL;  Surgeon: Corky Mull, MD;   Location: [facility] ORS;  Service: Orthopedics;  Laterality: Right;  . ESOPHAGOGASTRODUODENOSCOPY    . ESOPHAGOGASTRODUODENOSCOPY (EGD) WITH PROPOFOL N/A 12/06/2015   Procedure: ESOPHAGOGASTRODUODENOSCOPY (EGD) WITH PROPOFOL;  Surgeon: Manya Silvas, MD;  Location: [facility] ENDOSCOPY;  Service: Endoscopy;  Laterality: N/A;  . IR ANGIO INTRA EXTRACRAN SEL INTERNAL CAROTID UNI L MOD SED  07/15/2019  . IR ANGIO VERTEBRAL SEL SUBCLAVIAN INNOMINATE UNI R MOD SED  07/15/2019  . IR CT HEAD LTD  07/15/2019  . IR INTRAVSC STENT CERV CAROTID W/O EMB-PROT MOD SED INC ANGIO  07/15/2019  . IR PERCUTANEOUS ART THROMBECTOMY/INFUSION INTRACRANIAL INC DIAG ANGIO  07/15/2019  . LAPAROSCOPIC GASTRIC BANDING    . LAPAROSCOPIC GASTRIC RESTRICTIVE DUODENAL PROCEDURE (DUODENAL SWITCH) N/A 01/25/2016   Procedure: LAPAROSCOPIC GASTRIC RESTRICTIVE DUODENAL PROCEDURE (DUODENAL SWITCH);  Surgeon: Ladora Daniel, MD;  Location: [facility] ORS;  Service: General;  Laterality: N/A;  . OVARY SURGERY     x2  . RADIOLOGY WITH ANESTHESIA N/A 07/15/2019   Procedure: IR WITH ANESTHESIA;  Surgeon: Luanne Bras, MD;  Location: [place];  Service: Radiology;  Laterality: N/A;  . TONSILLECTOMY    . TUBAL LIGATION    . UMBILICAL HERNIA REPAIR N/A 01/25/2016   Procedure:  LAPAROSCOPIC UMBILICAL HERNIA;  Surgeon: Ladora Daniel, MD;  Location: [facility] ORS;  Service: General;  Laterality: N/A;  . UMBILICAL HERNIA REPAIR N/A 10/20/2016   Procedure: HERNIA REPAIR UMBILICAL ADULT;  Surgeon: Leonie Green, MD;  Location: [facility] ORS;  Service: General;  Laterality: N/A;    There were no vitals filed for this visit.  Subjective Assessment - 12/18/19 0930    Subjective   Pt. reports that she is going to the beach for their anniversary this weekend.    Patient is accompanied by:  Family member    Pertinent History  Per pt. chart. Pt. is a 49 y.o. female with history of HTN, T2DM, obesity who was admitted on 07/15/19 with onset of HA progressing to  flaccid left hemiparesis a few hours later.  CT head showed acute nonhemorrhagic infarct in right basal ganglia with hyperdense right MCA sign.  CT A/P head neck showed perfusion deficit with occluded right ICA and slow flow in M1.  She underwent cerebral angiogram with revascularization of right MCA with T1C1 revascularization and repair of right ICA with flow diverter device. Stroke felt to be embolic likely due to ICA dissection. Postprocedure head CT was negative for bleed repeat CTA head neck showed reocclusion of right ICA origin and occlusion of right ICA stent but now with patent right MCA. Pt. received inpatient rehab services for 3 weeks, and home health services.    Limitations  LUE AROM    Currently in Pain?  No/denies      OT TREATMENT    Neuro muscular re-education:  Pt. worked on grasping, placing and stacking [place] style discs at the tabletop surface. Emphasis was placed on extending her digits off of the discs to release them. Pt. Required alternating weightbearing through a flat hand during the task.  Therapeutic Exercise:  Pt. worked on left shoulder stabilization exercises in supine with the shoulder flexed to 90 degrees, and the elbow in extension. Pt. was able toinitiate active shoulder stabilization responses minimally with support provided proximally with assist in maintaining elbow in extension. Pt. Was able to actively stabilize her shoulder for 1 rep of  20 ec, followed by 1 rep of 10 sec.Pt. was able to perform scapular protraction with support provided at the elbow, and forearm.Pt. worked on [employer] shoulder flexion in sidelying for a gravity eliminated plane using the UE Ranger. Pt. Performed active supination, as well as wrist, and digit extension with facilitation, and alternating weightbearing.  Pt.continues to work on shoulder stabilization exercises at home. Pt.continues to makesteady progress.Pt.continues to be able toable to activate wrist  extension, as well as activate digit extension with facilitation, and alternating weightbearing. Pt.however, wasable to initiateminimal 5th digit extensionfrom the tabletop surface today.Pt.is able to consistently initiate active left shoulder horizontal abduction, elbow flexion, and extension, forearm supination, wrist extension, digit extension with facilitation, and thumb abduction. Pt. Continues to work on improving her ability to consistently actively release objects. Pt. continues torequirework on improvingshoulder stabilization, wrist, and digitmovements for hand to face patterns, and to increase LUE engagement during ADLs, and IADL tasks.                         OT Education - 12/18/19 0931    Education Details  Left UE  functioning    Person(s) Educated  Patient;Spouse    Methods  Demonstration    Comprehension  Returned demonstration;Verbalized understanding;Verbal cues required  OT Long Term Goals - 12/10/19 1619      OT LONG TERM GOAL #1   Title  Pt. will increase left shoulder flexion PROM by 20 degrees to assist with UE dressing    Baseline  Shoulder flexion sitting 38 degrees, supine: 18(135).Pt. continues to present with limited left shoulder PROM    Time  12    Period  Weeks    Status  On-going    Target Date  02/25/20      OT LONG TERM GOAL #2   Title  Pt. will be able to actively stabilize left shoulder for 10 seconds in supine with shoulder flexed to 90 degrees, and elbow in extension in preparation for functional reaching.    Baseline  Pt. was able to initate shoulder stabilization with her shoulder flexed to 90 degrees, and her elbow extended for 5 sec. each for 3  reps. Pt. continues to activate intermittent muscle responses.    Time  12    Period  Weeks    Status  On-going    Target Date  02/25/20      OT LONG TERM GOAL #3   Title  Pt. will independently perform hand to face patterns with the LUE in preparation for  light self grooming tasks.    Baseline  Pt. is able to initiate performing hand to face patterns, and is able to reach her face, however is unable to sustain, and use her hand at her face.    Time  12    Period  Weeks    Status  On-going    Target Date  02/25/20      OT LONG TERM GOAL #4   Title  Pt. will initiate 10 degrees of active left wrist extension  in preparation for actively lifting her hand off of a surface.    Baseline  Wrist extension: 5(60)    Time  12    Period  Weeks    Status  On-going    Target Date  02/25/20      OT LONG TERM GOAL #5   Title  Pt. will initiate left hand digit extension in order to be able to actively release an objects during ADL tasks.    Baseline  Pt. presents with consistent 2nd, and 5th digit extension.    Time  12    Period  Weeks    Status  On-going    Target Date  02/25/20      OT LONG TERM GOAL #6   Title  Pt. increase left hand digit flexion in order to be able to hold her toothbrush while applying toothpaste with her left right hand.    Baseline  Pt. is initiating activating gross digit flexors.    Time  12    Period  Weeks    Status  On-going    Target Date  02/25/20            Plan - 12/18/19 0932    Clinical Impression Statement  Pt.continues to work on shoulder stabilization exercises at home. Pt.continues to makesteady progress.Pt.continues to be able toable to activate wrist extension, as well as activate digit extension with facilitation, and alternating weightbearing. Pt.however, wasable to initiateminimal 5th digit extensionfrom the tabletop surface today.Pt.is able to consistently initiate active left shoulder horizontal abduction, elbow flexion, and extension, forearm supination, wrist extension, digit extension with facilitation, and thumb abduction. Pt. Continues to work on improving her ability to consistently actively release objects. Pt. continues torequirework  on improvingshoulder stabilization,  wrist, and digitmovements for hand to face patterns, and to increase LUE engagement during ADLs, and IADL tasks.     OT Occupational Profile and History  Detailed Assessment- Review of Records and additional review of physical, cognitive, psychosocial history related to current functional performance    Occupational performance deficits (Please refer to evaluation for details):  ADL's;IADL's    Rehab Potential  Good    Clinical Decision Making  Several treatment options, min-mod task modification necessary    Comorbidities Affecting Occupational Performance:  May have comorbidities impacting occupational performance    Modification or Assistance to Complete Evaluation   Min-Moderate modification of tasks or assist with assess necessary to complete eval    OT Frequency  2x / week    OT Treatment/Interventions  Self-care/ADL training;Moist Heat;DME and/or AE instruction;Neuromuscular education;Therapeutic exercise;Therapeutic activities;Energy conservation;Patient/family education    Consulted and Agree with Plan of Care  Patient       Patient will benefit from skilled therapeutic intervention in order to improve the following deficits and impairments:           Visit Diagnosis: Muscle weakness (generalized)  Other lack of coordination    Problem List Patient Active Problem List   Diagnosis Date Noted  . E. coli UTI   . Diabetes mellitus type 2 in obese ([place])   . Chronic pain of right knee   . 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 Hemiparesis affecting left side as late effect of stroke ([place])   . Acute ischemic right middle cerebral artery (MCA) stroke ([place]) 07/22/2019  . Carotid artery dissection  ([facility]) s/p stent placement 07/21/2019  . Diabetes mellitus type II, uncontrolled ([place]) 07/21/2019  . Acute blood loss anemia 07/21/2019  . Leukocytosis 07/21/2019  . Stroke (cerebrum) ([place]) - R MCA infarct s/p tenecteplase and mechanical thrombectomy w/ d/t ICA dissection   07/15/2019  . Middle cerebral artery embolism, right 07/15/2019  . Controlled type 2 diabetes mellitus without complication, without long-term current use of insulin ([place]) 05/28/2017  . Attention deficit hyperactivity disorder (ADHD), combined type 04/18/2017  . Morbid obesity ([place]) 01/25/2016  . Umbilical hernia [id number]  . Hiatal hernia 12/08/2015  . History of Clostridium difficile colitis 06/19/2014  . HTN (hypertension) 12/23/2013  . PCOS (polycystic ovarian syndrome) 12/23/2013  . Bariatric surgery status 01/28/2013    Harrel Carina, MS, OTR/L 12/18/2019, 11:17 AM  [place] MAIN [facility] SERVICES [address] [place], [place], [address] Phone: [phone]   Fax:  [phone]  Name: BEANCA BEQUETTE MRN: [id number] Date of Birth: 02-13-1971

## 2019-12-22 ENCOUNTER — Ambulatory Visit
Payer: No Typology Code available for payment source | Attending: Physical Medicine and Rehabilitation | Admitting: Occupational Therapy

## 2019-12-22 ENCOUNTER — Other Ambulatory Visit: Payer: Self-pay

## 2019-12-22 DIAGNOSIS — M6281 Muscle weakness (generalized): Secondary | ICD-10-CM | POA: Insufficient documentation

## 2019-12-22 DIAGNOSIS — R2689 Other abnormalities of gait and mobility: Secondary | ICD-10-CM | POA: Insufficient documentation

## 2019-12-22 DIAGNOSIS — R278 Other lack of coordination: Secondary | ICD-10-CM | POA: Insufficient documentation

## 2019-12-22 DIAGNOSIS — R262 Difficulty in walking, not elsewhere classified: Secondary | ICD-10-CM | POA: Insufficient documentation

## 2019-12-22 DIAGNOSIS — I63511 Cerebral infarction due to unspecified occlusion or stenosis of right middle cerebral artery: Secondary | ICD-10-CM | POA: Insufficient documentation

## 2019-12-23 ENCOUNTER — Encounter: Payer: Self-pay | Admitting: Occupational Therapy

## 2019-12-23 NOTE — Therapy (Signed)
[place] MAIN [facility] SERVICES [address] [place], [place], [address] Phone: [phone]   Fax:  [phone]  Occupational Therapy Treatment  Patient Details  Name: Kathryn Coffey MRN: [id number] Date of Birth: [birth year] Referring Provider (OT): Raulkar   Encounter Date: 12/22/2019  OT End of Session - 12/23/19 1551    Visit Number  23    Number of Visits  6    Date for OT Re-Evaluation  02/25/20    Authorization Type  Progress report period starting  on 12/10/2019    Authorization Time Period  FOTO    OT Start Time  1309    OT Stop Time  1348    OT Time Calculation (min)  39 min    Activity Tolerance  Patient tolerated treatment well    Behavior During Therapy  [facility] for tasks assessed/performed       Past Medical History:  Diagnosis Date  . Abdominal wall cellulitis 2013 and 2014  . Asthma    allergic to grasses  . B12 deficiency   . Complication of anesthesia   . Costochondritis   . Diabetes mellitus without complication ([place])   . Gastric anomaly    multiple small ulcers  . GERD (gastroesophageal reflux disease)   . Herpes 06/2018   POSSIBLY IN EYE- PT TAKING VALTREX AND WILL SEE OPTHAMOLOGIST TO SEE IF VALTREX IS WORKING  . History of abnormal cervical Pap smear   . History of Clostridium difficile colitis   . History of hiatal hernia   . History of kidney stones   . Hypertension   . IBS (irritable bowel syndrome)   . Migraine   . Morbid obesity ([place])    s/p attempted gastric banding now decompressed  . PCOS (polycystic ovarian syndrome)   . PONV (postoperative nausea and vomiting)    WITH SPINAL ONLY  . Stroke [facility])     Past Surgical History:  Procedure Laterality Date  . BREAST EXCISIONAL BIOPSY Left    cyst excision - Dr. Patty Sermons  . CESAREAN SECTION    . CHOLECYSTECTOMY    . COLONOSCOPY    . EAR CYST EXCISION Right 07/04/2018   Procedure: EXCISION GLOMUS TUMOR THUMBNAIL;  Surgeon: Corky Mull, MD;   Location: [facility] ORS;  Service: Orthopedics;  Laterality: Right;  . ESOPHAGOGASTRODUODENOSCOPY    . ESOPHAGOGASTRODUODENOSCOPY (EGD) WITH PROPOFOL N/A 12/06/2015   Procedure: ESOPHAGOGASTRODUODENOSCOPY (EGD) WITH PROPOFOL;  Surgeon: Manya Silvas, MD;  Location: [facility] ENDOSCOPY;  Service: Endoscopy;  Laterality: N/A;  . IR ANGIO INTRA EXTRACRAN SEL INTERNAL CAROTID UNI L MOD SED  07/15/2019  . IR ANGIO VERTEBRAL SEL SUBCLAVIAN INNOMINATE UNI R MOD SED  07/15/2019  . IR CT HEAD LTD  07/15/2019  . IR INTRAVSC STENT CERV CAROTID W/O EMB-PROT MOD SED INC ANGIO  07/15/2019  . IR PERCUTANEOUS ART THROMBECTOMY/INFUSION INTRACRANIAL INC DIAG ANGIO  07/15/2019  . LAPAROSCOPIC GASTRIC BANDING    . LAPAROSCOPIC GASTRIC RESTRICTIVE DUODENAL PROCEDURE (DUODENAL SWITCH) N/A 01/25/2016   Procedure: LAPAROSCOPIC GASTRIC RESTRICTIVE DUODENAL PROCEDURE (DUODENAL SWITCH);  Surgeon: Ladora Daniel, MD;  Location: [facility] ORS;  Service: General;  Laterality: N/A;  . OVARY SURGERY     x2  . RADIOLOGY WITH ANESTHESIA N/A 07/15/2019   Procedure: IR WITH ANESTHESIA;  Surgeon: Luanne Bras, MD;  Location: [place];  Service: Radiology;  Laterality: N/A;  . TONSILLECTOMY    . TUBAL LIGATION    . UMBILICAL HERNIA REPAIR N/A 01/25/2016   Procedure:  LAPAROSCOPIC UMBILICAL HERNIA;  Surgeon: Ladora Daniel, MD;  Location: [facility] ORS;  Service: General;  Laterality: N/A;  . UMBILICAL HERNIA REPAIR N/A 10/20/2016   Procedure: HERNIA REPAIR UMBILICAL ADULT;  Surgeon: Leonie Green, MD;  Location: [facility] ORS;  Service: General;  Laterality: N/A;    There were no vitals filed for this visit.  Subjective Assessment - 12/23/19 1548    Subjective   Patient reports she had a great trip to the beach for her anniversary, the weather was nice.  She worked on exercises in the car, trying to move her fingers and LUE while on the armrest.  She is pleased with getting some finger movements now.    Pertinent History  Per pt. chart. Pt. is  a 49 y.o. female with history of HTN, T2DM, obesity who was admitted on 07/15/19 with onset of HA progressing to flaccid left hemiparesis a few hours later.  CT head showed acute nonhemorrhagic infarct in right basal ganglia with hyperdense right MCA sign.  CT A/P head neck showed perfusion deficit with occluded right ICA and slow flow in M1.  She underwent cerebral angiogram with revascularization of right MCA with T1C1 revascularization and repair of right ICA with flow diverter device. Stroke felt to be embolic likely due to ICA dissection. Postprocedure head CT was negative for bleed repeat CTA head neck showed reocclusion of right ICA origin and occlusion of right ICA stent but now with patent right MCA. Pt. received inpatient rehab services for 3 weeks, and home health services.    Patient Stated Goals  Patient would like to be as independent as possible, return to her prior level of function    Currently in Pain?  No/denies    Pain Score  0-No pain       Patient seen this date for focus on left UE function as follows to promote neuromuscular reeducation: Finger extension passively followed by active assist with therapist taking the patient through the motions then working on active motion in available ranges.  Cues provided.  Finger ABD/ADD as a whole hand and individual digits following same protocol.  Patient does have some flexor tone present when performing finger exercises and is able to weight bear and switch between active and weight bearing to promote finger extension.  Patient seen for thumb extension and ABD with cues, this is the strongest motion of all digits currently for patient.  Attempts at finger tapping.  Patient performing AAROM/AROM of left wrist for flexion and extension this date.  Measurements taken of active wrist movement over blue wedge, first measurement of 15 degrees extension and towards end of exercises she was able to achieve 30 degrees of extension, wrist flexion to 60  degrees.    Attempts for object grasping with left hand with small velcro blocks but has difficulty with grasping patterns and tends to utilize more of a lateral pinch.    Response to tx:   Patient demonstrating great progress with her left hand and wrist this date and is showing more signs of active movement of the digits, working to normal tone and manage finger extension.  She responds well to cues and guiding from therapist and is motivated, working towards moving the hand at home and during daily activities.  She continues to benefit from skilled OT services to maximize safety and independence in daily tasks.                      OT Education -  12/23/19 1550    Education Details  Left UE  functioning, finger ROM, grasping patterns    Person(s) Educated  Patient    Methods  Demonstration;Explanation;Verbal cues    Comprehension  Returned demonstration;Verbalized understanding;Verbal cues required          OT Long Term Goals - 12/10/19 1619      OT LONG TERM GOAL #1   Title  Pt. will increase left shoulder flexion PROM by 20 degrees to assist with UE dressing    Baseline  Shoulder flexion sitting 38 degrees, supine: 18(135).Pt. continues to present with limited left shoulder PROM    Time  12    Period  Weeks    Status  On-going    Target Date  02/25/20      OT LONG TERM GOAL #2   Title  Pt. will be able to actively stabilize left shoulder for 10 seconds in supine with shoulder flexed to 90 degrees, and elbow in extension in preparation for functional reaching.    Baseline  Pt. was able to initate shoulder stabilization with her shoulder flexed to 90 degrees, and her elbow extended for 5 sec. each for 3  reps. Pt. continues to activate intermittent muscle responses.    Time  12    Period  Weeks    Status  On-going    Target Date  02/25/20      OT LONG TERM GOAL #3   Title  Pt. will independently perform hand to face patterns with the LUE in preparation for  light self grooming tasks.    Baseline  Pt. is able to initiate performing hand to face patterns, and is able to reach her face, however is unable to sustain, and use her hand at her face.    Time  12    Period  Weeks    Status  On-going    Target Date  02/25/20      OT LONG TERM GOAL #4   Title  Pt. will initiate 10 degrees of active left wrist extension  in preparation for actively lifting her hand off of a surface.    Baseline  Wrist extension: 5(60)    Time  12    Period  Weeks    Status  On-going    Target Date  02/25/20      OT LONG TERM GOAL #5   Title  Pt. will initiate left hand digit extension in order to be able to actively release an objects during ADL tasks.    Baseline  Pt. presents with consistent 2nd, and 5th digit extension.    Time  12    Period  Weeks    Status  On-going    Target Date  02/25/20      OT LONG TERM GOAL #6   Title  Pt. increase left hand digit flexion in order to be able to hold her toothbrush while applying toothpaste with her left right hand.    Baseline  Pt. is initiating activating gross digit flexors.    Time  12    Period  Weeks    Status  On-going    Target Date  02/25/20            Plan - 12/23/19 1552    Clinical Impression Statement  Patient demonstrating great progress with her left hand and wrist this date and is showing more signs of active movement of the digits, working to normal tone and manage finger extension.  She responds  well to cues and guiding from therapist and is motivated, working towards moving the hand at home and during daily activities.  She continues to benefit from skilled OT services to maximize safety and independence in daily tasks.    OT Occupational Profile and History  Detailed Assessment- Review of Records and additional review of physical, cognitive, psychosocial history related to current functional performance    Occupational performance deficits (Please refer to evaluation for details):  ADL's;IADL's     Rehab Potential  Good    Clinical Decision Making  Several treatment options, min-mod task modification necessary    Comorbidities Affecting Occupational Performance:  May have comorbidities impacting occupational performance    Modification or Assistance to Complete Evaluation   Min-Moderate modification of tasks or assist with assess necessary to complete eval    OT Frequency  2x / week    OT Duration  12 weeks    OT Treatment/Interventions  Self-care/ADL training;Moist Heat;DME and/or AE instruction;Neuromuscular education;Therapeutic exercise;Therapeutic activities;Energy conservation;Patient/family education    Consulted and Agree with Plan of Care  Patient       Patient will benefit from skilled therapeutic intervention in order to improve the following deficits and impairments:           Visit Diagnosis: Muscle weakness (generalized)  Other lack of coordination  Acute ischemic right middle cerebral artery (MCA) stroke [facility])    Problem List Patient Active Problem List   Diagnosis Date Noted  . E. coli UTI   . Diabetes mellitus type 2 in obese ([place])   . Chronic pain of right knee   . 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 Hemiparesis affecting left side as late effect of stroke ([place])   . Acute ischemic right middle cerebral artery (MCA) stroke ([place]) 07/22/2019  . Carotid artery dissection  (H[facility]) s/p stent placement 07/21/2019  . Diabetes mellitus type II, uncontrolled ([place]) 07/21/2019  . Acute blood loss anemia 07/21/2019  . Leukocytosis 07/21/2019  . Stroke (cerebrum) ([place]) - R MCA infarct s/p tenecteplase and mechanical thrombectomy w/ d/t ICA dissection  07/15/2019  . Middle cerebral artery embolism, right 07/15/2019  . Controlled type 2 diabetes mellitus without complication, without long-term current use of insulin ([place]) 05/28/2017  . Attention deficit hyperactivity disorder (ADHD), combined type 04/18/2017  . Morbid obesity ([place]) 01/25/2016  .  Umbilical hernia [id number]  . Hiatal hernia 12/08/2015  . History of Clostridium difficile colitis 06/19/2014  . HTN (hypertension) 12/23/2013  . PCOS (polycystic ovarian syndrome) 12/23/2013  . Bariatric surgery status 01/28/2013   Achilles Dunk, OTR/L, CLT Koury Roddy 12/24/2019, 9:26 AM  [place] MAIN [facility] SERVICES [address] [place], [place], [address] Phone: [phone]   Fax:  [phone]  Name: GILLIAN RITTENOUR MRN: [id number] Date of Birth: September 30, 1970

## 2019-12-24 ENCOUNTER — Ambulatory Visit: Payer: No Typology Code available for payment source

## 2019-12-24 ENCOUNTER — Encounter: Payer: Self-pay | Admitting: Occupational Therapy

## 2019-12-24 ENCOUNTER — Other Ambulatory Visit: Payer: Self-pay

## 2019-12-24 ENCOUNTER — Ambulatory Visit: Payer: No Typology Code available for payment source | Admitting: Occupational Therapy

## 2019-12-24 DIAGNOSIS — R262 Difficulty in walking, not elsewhere classified: Secondary | ICD-10-CM

## 2019-12-24 DIAGNOSIS — M6281 Muscle weakness (generalized): Secondary | ICD-10-CM

## 2019-12-24 NOTE — Therapy (Signed)
[place] MAIN [facility] SERVICES [address] [place], [place], [address] Phone: [phone]   Fax:  [phone]  Occupational Therapy Treatment  Patient Details  Name: Kathryn Coffey MRN: [id number] Date of Birth: 05-Jun-1971 Referring Provider (OT): Raulkar   Encounter Date: 12/24/2019  OT End of Session - 12/24/19 1612    Visit Number  24    Number of Visits  50    Date for OT Re-Evaluation  02/25/20    Authorization Type  Progress report period starting  on 12/10/2019    Authorization Time Period  FOTO    OT Start Time  1522    OT Stop Time  1600    OT Time Calculation (min)  38 min    Activity Tolerance  Patient tolerated treatment well    Behavior During Therapy  [facility] for tasks assessed/performed       Past Medical History:  Diagnosis Date  . Abdominal wall cellulitis 2013 and 2014  . Asthma    allergic to grasses  . B12 deficiency   . Complication of anesthesia   . Costochondritis   . Diabetes mellitus without complication ([place])   . Gastric anomaly    multiple small ulcers  . GERD (gastroesophageal reflux disease)   . Herpes 06/2018   POSSIBLY IN EYE- PT TAKING VALTREX AND WILL SEE OPTHAMOLOGIST TO SEE IF VALTREX IS WORKING  . History of abnormal cervical Pap smear   . History of Clostridium difficile colitis   . History of hiatal hernia   . History of kidney stones   . Hypertension   . IBS (irritable bowel syndrome)   . Migraine   . Morbid obesity ([place])    s/p attempted gastric banding now decompressed  . PCOS (polycystic ovarian syndrome)   . PONV (postoperative nausea and vomiting)    WITH SPINAL ONLY  . Stroke [facility])     Past Surgical History:  Procedure Laterality Date  . BREAST EXCISIONAL BIOPSY Left    cyst excision - Dr. Patty Sermons  . CESAREAN SECTION    . CHOLECYSTECTOMY    . COLONOSCOPY    . EAR CYST EXCISION Right 07/04/2018   Procedure: EXCISION GLOMUS TUMOR THUMBNAIL;  Surgeon: Corky Mull, MD;   Location: [facility] ORS;  Service: Orthopedics;  Laterality: Right;  . ESOPHAGOGASTRODUODENOSCOPY    . ESOPHAGOGASTRODUODENOSCOPY (EGD) WITH PROPOFOL N/A 12/06/2015   Procedure: ESOPHAGOGASTRODUODENOSCOPY (EGD) WITH PROPOFOL;  Surgeon: Manya Silvas, MD;  Location: [facility] ENDOSCOPY;  Service: Endoscopy;  Laterality: N/A;  . IR ANGIO INTRA EXTRACRAN SEL INTERNAL CAROTID UNI L MOD SED  07/15/2019  . IR ANGIO VERTEBRAL SEL SUBCLAVIAN INNOMINATE UNI R MOD SED  07/15/2019  . IR CT HEAD LTD  07/15/2019  . IR INTRAVSC STENT CERV CAROTID W/O EMB-PROT MOD SED INC ANGIO  07/15/2019  . IR PERCUTANEOUS ART THROMBECTOMY/INFUSION INTRACRANIAL INC DIAG ANGIO  07/15/2019  . LAPAROSCOPIC GASTRIC BANDING    . LAPAROSCOPIC GASTRIC RESTRICTIVE DUODENAL PROCEDURE (DUODENAL SWITCH) N/A 01/25/2016   Procedure: LAPAROSCOPIC GASTRIC RESTRICTIVE DUODENAL PROCEDURE (DUODENAL SWITCH);  Surgeon: Ladora Daniel, MD;  Location: [facility] ORS;  Service: General;  Laterality: N/A;  . OVARY SURGERY     x2  . RADIOLOGY WITH ANESTHESIA N/A 07/15/2019   Procedure: IR WITH ANESTHESIA;  Surgeon: Luanne Bras, MD;  Location: [place];  Service: Radiology;  Laterality: N/A;  . TONSILLECTOMY    . TUBAL LIGATION    . UMBILICAL HERNIA REPAIR N/A 01/25/2016   Procedure:  LAPAROSCOPIC UMBILICAL HERNIA;  Surgeon: Ladora Daniel, MD;  Location: [facility] ORS;  Service: General;  Laterality: N/A;  . UMBILICAL HERNIA REPAIR N/A 10/20/2016   Procedure: HERNIA REPAIR UMBILICAL ADULT;  Surgeon: Leonie Green, MD;  Location: [facility] ORS;  Service: General;  Laterality: N/A;    There were no vitals filed for this visit.  Subjective Assessment - 12/24/19 1610    Subjective   Pt. education was provided about A/E use for LE ADLs.    Patient is accompanied by:  Family member    Pertinent History  Per pt. chart. Pt. is a 49 y.o. female with history of HTN, T2DM, obesity who was admitted on 07/15/19 with onset of HA progressing to flaccid left hemiparesis a few  hours later.  CT head showed acute nonhemorrhagic infarct in right basal ganglia with hyperdense right MCA sign.  CT A/P head neck showed perfusion deficit with occluded right ICA and slow flow in M1.  She underwent cerebral angiogram with revascularization of right MCA with T1C1 revascularization and repair of right ICA with flow diverter device. Stroke felt to be embolic likely due to ICA dissection. Postprocedure head CT was negative for bleed repeat CTA head neck showed reocclusion of right ICA origin and occlusion of right ICA stent but now with patent right MCA. Pt. received inpatient rehab services for 3 weeks, and home health services.    Limitations  LUE AROM    Patient Stated Goals  Patient would like to be as independent as possible, return to her prior level of function    Currently in Pain?  No/denies      OT TREATMENT   Therapeutic Exercise:  Pt. worked on left shoulder stabilization exercises in supine with the shoulder flexed to 90 degrees, and the elbow in extension. Pt. was able toinitiate active shoulder stabilization responses minimally with support provided proximallywithassist in maintaining elbow in extension. Pt. was able to actively stabilize her shoulder for 3 reps of 25 sec. Each. Pt. was able to stabilize her shoulder with light perturbations.Pt. was able to perform scapular protraction. Pt.worked on AAROM shoulder flexion in sidelying for a gravity eliminated plane using the UE Ranger. Pt. performed active supination, as well as wrist, and digit extension with facilitation, and alternating weightbearing. Pt. worked on isolating digit extension in each digit at the tabletop. Pt. worked on digit extension with holding, and light resistance, and alternating sequence.  Pt. is making excellent progress today. Pt.is improving with LUE shoulder stabilization exercises, and was able to hold the perturbations today. Pt.is able to consistently initiate active left shoulder  horizontal abduction, elbow flexion, and extension, forearm supination, wrist extension, digit extension with facilitation, and thumb abduction. Pt. Presented with improved wrist, and was able to consistently isolate digit extension for the 2nd through 5th digits.Pt. Continues to work on improving her ability to consistently actively release objects. Pt. continues torequirework on improvingshoulder stabilization, wrist, and digitmovements for hand to face patterns, and to increase LUE engagement during ADLs, and IADL tasks.                       OT Education - 12/24/19 1612    Education Details  Left UE  functioning, finger ROM, grasping patterns    Person(s) Educated  Patient    Methods  Demonstration;Explanation;Verbal cues    Comprehension  Returned demonstration;Verbalized understanding;Verbal cues required          OT Long Term Goals - 12/10/19 1619  OT LONG TERM GOAL #1   Title  Pt. will increase left shoulder flexion PROM by 20 degrees to assist with UE dressing    Baseline  Shoulder flexion sitting 38 degrees, supine: 18(135).Pt. continues to present with limited left shoulder PROM    Time  12    Period  Weeks    Status  On-going    Target Date  02/25/20      OT LONG TERM GOAL #2   Title  Pt. will be able to actively stabilize left shoulder for 10 seconds in supine with shoulder flexed to 90 degrees, and elbow in extension in preparation for functional reaching.    Baseline  Pt. was able to initate shoulder stabilization with her shoulder flexed to 90 degrees, and her elbow extended for 5 sec. each for 3  reps. Pt. continues to activate intermittent muscle responses.    Time  12    Period  Weeks    Status  On-going    Target Date  02/25/20      OT LONG TERM GOAL #3   Title  Pt. will independently perform hand to face patterns with the LUE in preparation for light self grooming tasks.    Baseline  Pt. is able to initiate performing hand to face  patterns, and is able to reach her face, however is unable to sustain, and use her hand at her face.    Time  12    Period  Weeks    Status  On-going    Target Date  02/25/20      OT LONG TERM GOAL #4   Title  Pt. will initiate 10 degrees of active left wrist extension  in preparation for actively lifting her hand off of a surface.    Baseline  Wrist extension: 5(60)    Time  12    Period  Weeks    Status  On-going    Target Date  02/25/20      OT LONG TERM GOAL #5   Title  Pt. will initiate left hand digit extension in order to be able to actively release an objects during ADL tasks.    Baseline  Pt. presents with consistent 2nd, and 5th digit extension.    Time  12    Period  Weeks    Status  On-going    Target Date  02/25/20      OT LONG TERM GOAL #6   Title  Pt. increase left hand digit flexion in order to be able to hold her toothbrush while applying toothpaste with her left right hand.    Baseline  Pt. is initiating activating gross digit flexors.    Time  12    Period  Weeks    Status  On-going    Target Date  02/25/20            Plan - 12/24/19 1613    Clinical Impression Statement  Pt. is making excellent progress today. Pt.is improving with LUE shoulder stabilization exercises, and was able to hold the perturbations today. Pt.is able to consistently initiate active left shoulder horizontal abduction, elbow flexion, and extension, forearm supination, wrist extension, digit extension with facilitation, and thumb abduction. Pt. Presented with improved wrist, and was able to consistently isolate digit extension for the 2nd through 5th digits.Pt. Continues to work on improving her ability to consistently actively release objects. Pt. continues torequirework on improvingshoulder stabilization, wrist, and digitmovements for hand to face patterns, and to increase LUE  engagement during ADLs, and IADL tasks.   OT Occupational Profile and History  Detailed Assessment-  Review of Records and additional review of physical, cognitive, psychosocial history related to current functional performance    Occupational performance deficits (Please refer to evaluation for details):  ADL's;IADL's    Rehab Potential  Good    Clinical Decision Making  Several treatment options, min-mod task modification necessary    Comorbidities Affecting Occupational Performance:  May have comorbidities impacting occupational performance    Modification or Assistance to Complete Evaluation   Min-Moderate modification of tasks or assist with assess necessary to complete eval    OT Frequency  2x / week    OT Duration  12 weeks    OT Treatment/Interventions  Self-care/ADL training;Moist Heat;DME and/or AE instruction;Neuromuscular education;Therapeutic exercise;Therapeutic activities;Energy conservation;Patient/family education    Consulted and Agree with Plan of Care  Patient       Patient will benefit from skilled therapeutic intervention in order to improve the following deficits and impairments:           Visit Diagnosis: Muscle weakness (generalized)    Problem List Patient Active Problem List   Diagnosis Date Noted  . E. coli UTI   . Diabetes mellitus type 2 in obese ([place])   . Chronic pain of right knee   . 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 Hemiparesis affecting left side as late effect of stroke ([place])   . Acute ischemic right middle cerebral artery (MCA) stroke ([place]) 07/22/2019  . Carotid artery dissection  (HCC) s/p stent placement 07/21/2019  . Diabetes mellitus type II, uncontrolled ([place]) 07/21/2019  . Acute blood loss anemia 07/21/2019  . Leukocytosis 07/21/2019  . Stroke (cerebrum) ([place]) - R MCA infarct s/p tenecteplase and mechanical thrombectomy w/ d/t ICA dissection  07/15/2019  . Middle cerebral artery embolism, right 07/15/2019  . Controlled type 2 diabetes mellitus without complication, without long-term current use of insulin ([place])  05/28/2017  . Attention deficit hyperactivity disorder (ADHD), combined type 04/18/2017  . Morbid obesity ([place]) 01/25/2016  . Umbilical hernia [id number]  . Hiatal hernia 12/08/2015  . History of Clostridium difficile colitis 06/19/2014  . HTN (hypertension) 12/23/2013  . PCOS (polycystic ovarian syndrome) 12/23/2013  . Bariatric surgery status 01/28/2013    Harrel Carina, MS, OTR/L 12/24/2019, 4:17 PM  [place] MAIN [facility] SERVICES [address] [place], [place], [address] Phone: [phone]   Fax:  [phone]  Name: Kathryn Coffey MRN: [id number] Date of Birth: 1971-04-15

## 2019-12-24 NOTE — Therapy (Signed)
[place] MAIN [facility] SERVICES [address] [place], [place], [address] Phone: [phone]   Fax:  [phone]  Physical Therapy Treatment  Patient Details  Name: Kathryn Coffey MRN: [id number] Date of Birth: [birth year] No data recorded  Encounter Date: 12/24/2019  PT End of Session - 12/24/19 1621    Visit Number  16    Number of Visits  49    Date for PT Re-Evaluation  01/26/20    PT Start Time  1620    PT Stop Time  1645    PT Time Calculation (min)  25 min    Equipment Utilized During Treatment  Gait belt    Activity Tolerance  Patient tolerated treatment well    Behavior During Therapy  WFL for tasks assessed/performed       Past Medical History:  Diagnosis Date  . Abdominal wall cellulitis 2013 and 2014  . Asthma    allergic to grasses  . B12 deficiency   . Complication of anesthesia   . Costochondritis   . Diabetes mellitus without complication ([place])   . Gastric anomaly    multiple small ulcers  . GERD (gastroesophageal reflux disease)   . Herpes 06/2018   POSSIBLY IN EYE- PT TAKING VALTREX AND WILL SEE OPTHAMOLOGIST TO SEE IF VALTREX IS WORKING  . History of abnormal cervical Pap smear   . History of Clostridium difficile colitis   . History of hiatal hernia   . History of kidney stones   . Hypertension   . IBS (irritable bowel syndrome)   . Migraine   . Morbid obesity ([place])    s/p attempted gastric banding now decompressed  . PCOS (polycystic ovarian syndrome)   . PONV (postoperative nausea and vomiting)    WITH SPINAL ONLY  . Stroke [facility])     Past Surgical History:  Procedure Laterality Date  . BREAST EXCISIONAL BIOPSY Left    cyst excision - Dr. Patty Sermons  . CESAREAN SECTION    . CHOLECYSTECTOMY    . COLONOSCOPY    . EAR CYST EXCISION Right 07/04/2018   Procedure: EXCISION GLOMUS TUMOR THUMBNAIL;  Surgeon: Corky Mull, MD;  Location: [facility] ORS;  Service: Orthopedics;  Laterality: Right;  .  ESOPHAGOGASTRODUODENOSCOPY    . ESOPHAGOGASTRODUODENOSCOPY (EGD) WITH PROPOFOL N/A 12/06/2015   Procedure: ESOPHAGOGASTRODUODENOSCOPY (EGD) WITH PROPOFOL;  Surgeon: Manya Silvas, MD;  Location: [facility] ENDOSCOPY;  Service: Endoscopy;  Laterality: N/A;  . IR ANGIO INTRA EXTRACRAN SEL INTERNAL CAROTID UNI L MOD SED  07/15/2019  . IR ANGIO VERTEBRAL SEL SUBCLAVIAN INNOMINATE UNI R MOD SED  07/15/2019  . IR CT HEAD LTD  07/15/2019  . IR INTRAVSC STENT CERV CAROTID W/O EMB-PROT MOD SED INC ANGIO  07/15/2019  . IR PERCUTANEOUS ART THROMBECTOMY/INFUSION INTRACRANIAL INC DIAG ANGIO  07/15/2019  . LAPAROSCOPIC GASTRIC BANDING    . LAPAROSCOPIC GASTRIC RESTRICTIVE DUODENAL PROCEDURE (DUODENAL SWITCH) N/A 01/25/2016   Procedure: LAPAROSCOPIC GASTRIC RESTRICTIVE DUODENAL PROCEDURE (DUODENAL SWITCH);  Surgeon: Ladora Daniel, MD;  Location: [facility] ORS;  Service: General;  Laterality: N/A;  . OVARY SURGERY     x2  . RADIOLOGY WITH ANESTHESIA N/A 07/15/2019   Procedure: IR WITH ANESTHESIA;  Surgeon: Luanne Bras, MD;  Location: [place];  Service: Radiology;  Laterality: N/A;  . TONSILLECTOMY    . TUBAL LIGATION    . UMBILICAL HERNIA REPAIR N/A 01/25/2016   Procedure: LAPAROSCOPIC UMBILICAL HERNIA;  Surgeon: Ladora Daniel, MD;  Location: [facility] ORS;  Service: General;  Laterality: N/A;  . UMBILICAL HERNIA REPAIR N/A 10/20/2016   Procedure: HERNIA REPAIR UMBILICAL ADULT;  Surgeon: Leonie Green, MD;  Location: [facility] ORS;  Service: General;  Laterality: N/A;    There were no vitals filed for this visit.  Subjective Assessment - 12/25/19 1125    Subjective  Patient reports she is doing well today. No resting pain reported upon arrival. She is wearing her knee brace on the L side which has helped with her hyperextension and she reports that at this point she hardly notes it anymore. Compliant with HEP. She continues to have minimal function out of LUE but has noticed improvement in her ability to move her  fingers. No specific questions or concerns currently.    Pertinent History  Patient was in the hospital [place] and then in patient rehab 3 weeks. She was dicharged 08/13/19 and then HHPT and was discharged 09/03/19. She has not had any falls.    Currently in Pain?  No/denies          TREATMENT   Ther-ex Hooklying L SLR 3# ankle weight 2 x 15; Hooklying clams with manual resistance 2 x 15; Hooklying adductor squeezes with manual resistance 2 x 15; Hooklying bridges with RLE extended to bias LLE 2 x 15; Hooklying manually resisted L single leg press 2 x 15; R sidelying L hip straight leg abduction with 3# AW 2 x 15; Prone L HS curls with light manual resistance from therapist 2 x 15; Prone L straight knee hip extension 2 x 15; Seated LAQ with manual resistance 2 x 15; Seated L HS curls with manual resistance 2 x 15;   Pt educated throughout session about proper posture and technique with exercises. Improved exercise technique, movement at target joints, use of target muscles after min to mod verbal, visual, tactile cues.   Pt demonstrates excellent motivation during sessiontoday. Performed exercises today in supine, sidelying, prone, and seated to target strengthening. She continues to improve with her strength but is still unable to perform a L single leg bridge. Intermittent rest breaks provided between sets.Pt encouraged to continue HEP. Pt will benefit from PT services to address deficits in strength, balance, and mobility in order to return to full function at home.                           PT Short Term Goals - 11/03/19 1356      PT SHORT TERM GOAL #1   Title  Patient will be independent in home exercise program to improve strength/mobility for better functional independence with ADLs.    Baseline  11/03/19: Pt performing HEP at least 5d/wk    Time  6    Period  Weeks    Status  Partially Met    Target Date  12/15/19      PT SHORT TERM  GOAL #2   Title  Patient (> 4 years old) will complete five times sit to stand test in < 15 seconds indicating an increased LE strength and improved balance.    Baseline  eval: 15.28s; 11/03/19: 12.2s    Time  6    Period  Weeks    Status  Achieved    Target Date  10/20/19        PT Long Term Goals - 11/03/19 1359      PT LONG TERM GOAL #1   Title  Patient will increase Berg Balance score by >  6 points to demonstrate decreased fall risk during functional activities.    Baseline  09/08/19: 48/56; 11/03/19: 51/56    Time  12    Period  Weeks    Status  Partially Met    Target Date  01/26/20      PT LONG TERM GOAL #2   Title  Patient will increase six minute walk test distance to >1200 for progression to community ambulator and improve gait ability    Baseline  09/08/19: 525', 11/03/19: 817';    Time  12    Period  Weeks    Status  Partially Met    Target Date  01/26/20      PT LONG TERM GOAL #3   Title  Patient will increase 10 meter walk test to >1.59ms as to improve gait speed for better community ambulation and to reduce fall risk    Baseline  09/08/19:  0.51 m/s; 11/03/19: self-selected: 13.2s = 0.76 m/s, fastest: 12.5s = 0.8 m/s    Time  12    Period  Weeks    Status  Partially Met    Target Date  01/26/20            Plan - 12/24/19 1622    Clinical Impression Statement  Pt demonstrates excellent motivation during session today. Performed exercises today in supine, sidelying, prone, and seated to target strengthening. She continues to improve with her strength but is still unable to perform a L single leg bridge. Intermittent rest breaks provided between sets. Pt encouraged to continue HEP. Pt will benefit from PT services to address deficits in strength, balance, and mobility in order to return to full function at home.    Personal Factors and Comorbidities  Comorbidity 1    Comorbidities  diabetes, high blood pressure, reflux,    Examination-Activity Limitations   Carry;Caring for Others;Bend;Locomotion Level;Self Feeding;Sleep;Toileting    Examination-Participation Restrictions  Cleaning;Community Activity;Driving;Laundry;Shop    Stability/Clinical Decision Making  Stable/Uncomplicated    Rehab Potential  Good    PT Frequency  2x / week    PT Duration  12 weeks    PT Treatment/Interventions  Manual techniques;Functional mobility training;Stair training;Gait training;Electrical Stimulation;Moist Heat;Ultrasound;Therapeutic exercise;Balance training;Neuromuscular re-education    PT Next Visit Plan  strengthening and balance    PT Home Exercise Plan  standing hip abd,    Consulted and Agree with Plan of Care  Patient       Patient will benefit from skilled therapeutic intervention in order to improve the following deficits and impairments:  Abnormal gait, Decreased knowledge of use of DME, Dizziness, Pain, Decreased coordination, Impaired UE functional use, Decreased strength, Decreased endurance, Decreased activity tolerance, Decreased balance, Difficulty walking, Obesity  Visit Diagnosis: Muscle weakness (generalized)  Difficulty in walking, not elsewhere classified     Problem List Patient Active Problem List   Diagnosis Date Noted  . E. coli UTI   . Diabetes mellitus type 2 in obese ([place]   . Chronic pain of right knee   . Flaccid monoplegia of upper extremity ([place]   . Hemiparesis affecting left side as late effect of stroke ([place]   . Acute ischemic right middle cerebral artery (MCA) stroke ([place] 07/22/2019  . Carotid artery dissection  (HCC) s/p stent placement 07/21/2019  . Diabetes mellitus type II, uncontrolled ([place] 07/21/2019  . Acute blood loss anemia 07/21/2019  . Leukocytosis 07/21/2019  . Stroke (cerebrum) ([place] - R MCA infarct s/p tenecteplase and mechanical thrombectomy w/ d/t ICA dissection  07/15/2019  .  Middle cerebral artery embolism, right 07/15/2019  . Controlled type 2 diabetes mellitus without complication, without  long-term current use of insulin ([place]) 05/28/2017  . Attention deficit hyperactivity disorder (ADHD), combined type 04/18/2017  . Morbid obesity ([place]) 01/25/2016  . Umbilical hernia 20/74/0979  . Hiatal hernia 12/08/2015  . History of Clostridium difficile colitis 06/19/2014  . HTN (hypertension) 12/23/2013  . PCOS (polycystic ovarian syndrome) 12/23/2013  . Bariatric surgery status 01/28/2013   Phillips Grout PT, DPT, GCS  Colin Ellers 12/25/2019, 11:40 AM  [place] MAIN [facility] SERVICES [address] [place], [place], [address] Phone: [phone]   Fax:  [phone]  Name: Kathryn Coffey MRN: [id number] Date of Birth: Feb 27, 1971

## 2019-12-29 ENCOUNTER — Ambulatory Visit: Payer: No Typology Code available for payment source | Admitting: Occupational Therapy

## 2019-12-29 ENCOUNTER — Telehealth: Payer: Self-pay

## 2019-12-29 ENCOUNTER — Other Ambulatory Visit: Payer: Self-pay

## 2019-12-29 ENCOUNTER — Ambulatory Visit: Payer: No Typology Code available for payment source

## 2019-12-29 DIAGNOSIS — M6281 Muscle weakness (generalized): Secondary | ICD-10-CM

## 2019-12-29 DIAGNOSIS — R262 Difficulty in walking, not elsewhere classified: Secondary | ICD-10-CM

## 2019-12-29 DIAGNOSIS — R278 Other lack of coordination: Secondary | ICD-10-CM

## 2019-12-29 DIAGNOSIS — R2689 Other abnormalities of gait and mobility: Secondary | ICD-10-CM

## 2019-12-29 NOTE — Therapy (Signed)
[place] MAIN [facility] SERVICES [address] [place], [place], [address] Phone: [phone]   Fax:  [phone]  Physical Therapy Treatment  Patient Details  Name: Kathryn Coffey MRN: [id number] Date of Birth: 1971/06/07 No data recorded  Encounter Date: 12/29/2019  PT End of Session - 12/29/19 1618    Visit Number  17    Number of Visits  49    Date for PT Re-Evaluation  01/26/20    PT Start Time  1520    PT Stop Time  1601    PT Time Calculation (min)  41 min    Equipment Utilized During Treatment  Gait belt    Activity Tolerance  Patient tolerated treatment well    Behavior During Therapy  WFL for tasks assessed/performed       Past Medical History:  Diagnosis Date  . Abdominal wall cellulitis 2013 and 2014  . Asthma    allergic to grasses  . B12 deficiency   . Complication of anesthesia   . Costochondritis   . Diabetes mellitus without complication ([place])   . Gastric anomaly    multiple small ulcers  . GERD (gastroesophageal reflux disease)   . Herpes 06/2018   POSSIBLY IN EYE- PT TAKING VALTREX AND WILL SEE OPTHAMOLOGIST TO SEE IF VALTREX IS WORKING  . History of abnormal cervical Pap smear   . History of Clostridium difficile colitis   . History of hiatal hernia   . History of kidney stones   . Hypertension   . IBS (irritable bowel syndrome)   . Migraine   . Morbid obesity ([place])    s/p attempted gastric banding now decompressed  . PCOS (polycystic ovarian syndrome)   . PONV (postoperative nausea and vomiting)    WITH SPINAL ONLY  . Stroke [facility])     Past Surgical History:  Procedure Laterality Date  . BREAST EXCISIONAL BIOPSY Left    cyst excision - Dr. Patty Sermons  . CESAREAN SECTION    . CHOLECYSTECTOMY    . COLONOSCOPY    . EAR CYST EXCISION Right 07/04/2018   Procedure: EXCISION GLOMUS TUMOR THUMBNAIL;  Surgeon: Corky Mull, MD;  Location: [facility] ORS;  Service: Orthopedics;  Laterality: Right;  .  ESOPHAGOGASTRODUODENOSCOPY    . ESOPHAGOGASTRODUODENOSCOPY (EGD) WITH PROPOFOL N/A 12/06/2015   Procedure: ESOPHAGOGASTRODUODENOSCOPY (EGD) WITH PROPOFOL;  Surgeon: Manya Silvas, MD;  Location: [facility] ENDOSCOPY;  Service: Endoscopy;  Laterality: N/A;  . IR ANGIO INTRA EXTRACRAN SEL INTERNAL CAROTID UNI L MOD SED  07/15/2019  . IR ANGIO VERTEBRAL SEL SUBCLAVIAN INNOMINATE UNI R MOD SED  07/15/2019  . IR CT HEAD LTD  07/15/2019  . IR INTRAVSC STENT CERV CAROTID W/O EMB-PROT MOD SED INC ANGIO  07/15/2019  . IR PERCUTANEOUS ART THROMBECTOMY/INFUSION INTRACRANIAL INC DIAG ANGIO  07/15/2019  . LAPAROSCOPIC GASTRIC BANDING    . LAPAROSCOPIC GASTRIC RESTRICTIVE DUODENAL PROCEDURE (DUODENAL SWITCH) N/A 01/25/2016   Procedure: LAPAROSCOPIC GASTRIC RESTRICTIVE DUODENAL PROCEDURE (DUODENAL SWITCH);  Surgeon: Ladora Daniel, MD;  Location: [facility] ORS;  Service: General;  Laterality: N/A;  . OVARY SURGERY     x2  . RADIOLOGY WITH ANESTHESIA N/A 07/15/2019   Procedure: IR WITH ANESTHESIA;  Surgeon: Luanne Bras, MD;  Location: [place];  Service: Radiology;  Laterality: N/A;  . TONSILLECTOMY    . TUBAL LIGATION    . UMBILICAL HERNIA REPAIR N/A 01/25/2016   Procedure: LAPAROSCOPIC UMBILICAL HERNIA;  Surgeon: Ladora Daniel, MD;  Location: [facility] ORS;  Service: General;  Laterality: N/A;  . UMBILICAL HERNIA REPAIR N/A 10/20/2016   Procedure: HERNIA REPAIR UMBILICAL ADULT;  Surgeon: Leonie Green, MD;  Location: [facility] ORS;  Service: General;  Laterality: N/A;    There were no vitals filed for this visit.  Subjective Assessment - 12/29/19 1529    Subjective  Patient reports she is doing well today. No resting pain reported upon arrival. She is wearing her knee brace on the L side which continues with her hyperextension. Compliant with HEP. No specific questions or concerns currently.    Pertinent History  Patient was in the hospital [place] and then in patient rehab 3 weeks. She was dicharged 08/13/19  and then HHPT and was discharged 09/03/19. She has not had any falls.    Currently in Pain?  No/denies         TREATMENT   Ther-ex NuStep L2 x 5 minutes for warm-up during history (3 minutes unbilled); Precor single leg press LLE: 40# x 20, x 17 (muscle failure obtained); Precor single leg heel raise LLE: 25# 2 x 20; Matrix resisted gait with 12.5# x 2 each direction forward/backward/R lateral/L lateral;  Standing exercises with 5# ankle weights BLE: Hip flexion marches x 20; Hip abduction x 20; HS curls x 20; Hip extension x 20;  Standing heel raises x 20 with BUE support; Sit to stand with RLE on 6" step to bias LLE, no UE assist 2 x 10; Seated LAQ with manual resistance 2 x 15;   Pt educated throughout session about proper posture and technique with exercises. Improved exercise technique, movement at target joints, use of target muscles after min to mod verbal, visual, tactile cues.   Pt demonstrates excellent motivation during sessiontoday. Incorporated more standing exercises into session today. Performed leg press again as well as resisted gait. Intermittent rest breaks provided between sets.Pt encouraged to continue HEP. Pt will benefit from PT services to address deficits in strength, balance, and mobility in order to return to full function at home.                         PT Short Term Goals - 11/03/19 1356      PT SHORT TERM GOAL #1   Title  Patient will be independent in home exercise program to improve strength/mobility for better functional independence with ADLs.    Baseline  11/03/19: Pt performing HEP at least 5d/wk    Time  6    Period  Weeks    Status  Partially Met    Target Date  12/15/19      PT SHORT TERM GOAL #2   Title  Patient (> 63 years old) will complete five times sit to stand test in < 15 seconds indicating an increased LE strength and improved balance.    Baseline  eval: 15.28s; 11/03/19: 12.2s    Time  6     Period  Weeks    Status  Achieved    Target Date  10/20/19        PT Long Term Goals - 11/03/19 1359      PT LONG TERM GOAL #1   Title  Patient will increase Berg Balance score by > 6 points to demonstrate decreased fall risk during functional activities.    Baseline  09/08/19: 48/56; 11/03/19: 51/56    Time  12    Period  Weeks    Status  Partially Met    Target Date  01/26/20      PT LONG TERM GOAL #2   Title  Patient will increase six minute walk test distance to >1200 for progression to community ambulator and improve gait ability    Baseline  09/08/19: 525', 11/03/19: 817';    Time  12    Period  Weeks    Status  Partially Met    Target Date  01/26/20      PT LONG TERM GOAL #3   Title  Patient will increase 10 meter walk test to >1.39ms as to improve gait speed for better community ambulation and to reduce fall risk    Baseline  09/08/19:  0.51 m/s; 11/03/19: self-selected: 13.2s = 0.76 m/s, fastest: 12.5s = 0.8 m/s    Time  12    Period  Weeks    Status  Partially Met    Target Date  01/26/20            Plan - 12/29/19 1619    Clinical Impression Statement  Pt demonstrates excellent motivation during session today. Incorporated more standing exercises into session today. Performed leg press again as well as resisted gait. Intermittent rest breaks provided between sets. Pt encouraged to continue HEP. Pt will benefit from PT services to address deficits in strength, balance, and mobility in order to return to full function at home.    Personal Factors and Comorbidities  Comorbidity 1    Comorbidities  diabetes, high blood pressure, reflux,    Examination-Activity Limitations  Carry;Caring for Others;Bend;Locomotion Level;Self Feeding;Sleep;Toileting    Examination-Participation Restrictions  Cleaning;Community Activity;Driving;Laundry;Shop    Stability/Clinical Decision Making  Stable/Uncomplicated    Rehab Potential  Good    PT Frequency  2x / week    PT Duration  12  weeks    PT Treatment/Interventions  Manual techniques;Functional mobility training;Stair training;Gait training;Electrical Stimulation;Moist Heat;Ultrasound;Therapeutic exercise;Balance training;Neuromuscular re-education    PT Next Visit Plan  strengthening and balance    PT Home Exercise Plan  standing hip abd,    Consulted and Agree with Plan of Care  Patient       Patient will benefit from skilled therapeutic intervention in order to improve the following deficits and impairments:  Abnormal gait, Decreased knowledge of use of DME, Dizziness, Pain, Decreased coordination, Impaired UE functional use, Decreased strength, Decreased endurance, Decreased activity tolerance, Decreased balance, Difficulty walking, Obesity  Visit Diagnosis: Muscle weakness (generalized)  Difficulty in walking, not elsewhere classified  Other abnormalities of gait and mobility     Problem List Patient Active Problem List   Diagnosis Date Noted  . E. coli UTI   . Diabetes mellitus type 2 in obese ([place]   . Chronic pain of right knee   . Flaccid monoplegia of upper extremity ([place]   . Hemiparesis affecting left side as late effect of stroke ([place]   . Acute ischemic right middle cerebral artery (MCA) stroke ([place] 07/22/2019  . Carotid artery dissection  (HCC) s/p stent placement 07/21/2019  . Diabetes mellitus type II, uncontrolled ([place] 07/21/2019  . Acute blood loss anemia 07/21/2019  . Leukocytosis 07/21/2019  . Stroke (cerebrum) ([place] - R MCA infarct s/p tenecteplase and mechanical thrombectomy w/ d/t ICA dissection  07/15/2019  . Middle cerebral artery embolism, right 07/15/2019  . Controlled type 2 diabetes mellitus without complication, without long-term current use of insulin ([place] 05/28/2017  . Attention deficit hyperactivity disorder (ADHD), combined type 04/18/2017  . Morbid obesity ([place] 01/25/2016  . Umbilical hernia 070/96/2836 . Hiatal hernia 12/08/2015  .  History of Clostridium  difficile colitis 06/19/2014  . HTN (hypertension) 12/23/2013  . PCOS (polycystic ovarian syndrome) 12/23/2013  . Bariatric surgery status 01/28/2013   Phillips Grout PT, DPT, GCS  Huprich,Jason 12/29/2019, 4:36 PM  [place] MAIN [facility] SERVICES [address] [place], [place], [address] Phone: [phone]   Fax:  [phone]  Name: NEEYA PRIGMORE MRN: [id number] Date of Birth: [birth year]

## 2019-12-29 NOTE — Telephone Encounter (Signed)
Pt called the office to advise that she was having pain in the base of her neck and skull. She said that she was due for Carotids in May by Dr Estanislado Pandy.   Called patient but noticed she had never been seen at out office. She said that she was actually just trying to get in touch with [place] [facility] to schedule.   York Cerise, CMA

## 2019-12-30 ENCOUNTER — Encounter: Payer: Self-pay | Admitting: Occupational Therapy

## 2019-12-30 NOTE — Therapy (Signed)
[place] MAIN [facility] SERVICES [address] [place], [place], [address] Phone: [phone]   Fax:  [phone]  Occupational Therapy Treatment  Patient Details  Name: Kathryn Coffey MRN: [id number] Date of Birth: [birth year] Referring Provider (OT): Raulkar   Encounter Date: 12/29/2019  OT End of Session - 12/30/19 1341    Visit Number  25    Number of Visits  43    Date for OT Re-Evaluation  02/25/20    Authorization Type  Progress report period starting  on 12/10/2019    Authorization Time Period  FOTO    OT Start Time  1600    OT Stop Time  1645    OT Time Calculation (min)  45 min    Activity Tolerance  Patient tolerated treatment well    Behavior During Therapy  [facility] for tasks assessed/performed       Past Medical History:  Diagnosis Date  . Abdominal wall cellulitis 2013 and 2014  . Asthma    allergic to grasses  . B12 deficiency   . Complication of anesthesia   . Costochondritis   . Diabetes mellitus without complication ([place])   . Gastric anomaly    multiple small ulcers  . GERD (gastroesophageal reflux disease)   . Herpes 06/2018   POSSIBLY IN EYE- PT TAKING VALTREX AND WILL SEE OPTHAMOLOGIST TO SEE IF VALTREX IS WORKING  . History of abnormal cervical Pap smear   . History of Clostridium difficile colitis   . History of hiatal hernia   . History of kidney stones   . Hypertension   . IBS (irritable bowel syndrome)   . Migraine   . Morbid obesity ([place])    s/p attempted gastric banding now decompressed  . PCOS (polycystic ovarian syndrome)   . PONV (postoperative nausea and vomiting)    WITH SPINAL ONLY  . Stroke [facility])     Past Surgical History:  Procedure Laterality Date  . BREAST EXCISIONAL BIOPSY Left    cyst excision - Dr. Patty Sermons  . CESAREAN SECTION    . CHOLECYSTECTOMY    . COLONOSCOPY    . EAR CYST EXCISION Right 07/04/2018   Procedure: EXCISION GLOMUS TUMOR THUMBNAIL;  Surgeon: Corky Mull, MD;   Location: [facility] ORS;  Service: Orthopedics;  Laterality: Right;  . ESOPHAGOGASTRODUODENOSCOPY    . ESOPHAGOGASTRODUODENOSCOPY (EGD) WITH PROPOFOL N/A 12/06/2015   Procedure: ESOPHAGOGASTRODUODENOSCOPY (EGD) WITH PROPOFOL;  Surgeon: Manya Silvas, MD;  Location: [facility] ENDOSCOPY;  Service: Endoscopy;  Laterality: N/A;  . IR ANGIO INTRA EXTRACRAN SEL INTERNAL CAROTID UNI L MOD SED  07/15/2019  . IR ANGIO VERTEBRAL SEL SUBCLAVIAN INNOMINATE UNI R MOD SED  07/15/2019  . IR CT HEAD LTD  07/15/2019  . IR INTRAVSC STENT CERV CAROTID W/O EMB-PROT MOD SED INC ANGIO  07/15/2019  . IR PERCUTANEOUS ART THROMBECTOMY/INFUSION INTRACRANIAL INC DIAG ANGIO  07/15/2019  . LAPAROSCOPIC GASTRIC BANDING    . LAPAROSCOPIC GASTRIC RESTRICTIVE DUODENAL PROCEDURE (DUODENAL SWITCH) N/A 01/25/2016   Procedure: LAPAROSCOPIC GASTRIC RESTRICTIVE DUODENAL PROCEDURE (DUODENAL SWITCH);  Surgeon: Ladora Daniel, MD;  Location: [facility] ORS;  Service: General;  Laterality: N/A;  . OVARY SURGERY     x2  . RADIOLOGY WITH ANESTHESIA N/A 07/15/2019   Procedure: IR WITH ANESTHESIA;  Surgeon: Luanne Bras, MD;  Location: [place];  Service: Radiology;  Laterality: N/A;  . TONSILLECTOMY    . TUBAL LIGATION    . UMBILICAL HERNIA REPAIR N/A 01/25/2016   Procedure:  LAPAROSCOPIC UMBILICAL HERNIA;  Surgeon: Ladora Daniel, MD;  Location: [facility] ORS;  Service: General;  Laterality: N/A;  . UMBILICAL HERNIA REPAIR N/A 10/20/2016   Procedure: HERNIA REPAIR UMBILICAL ADULT;  Surgeon: Leonie Green, MD;  Location: [facility] ORS;  Service: General;  Laterality: N/A;    There were no vitals filed for this visit.  Subjective Assessment - 12/30/19 1340    Subjective   Pt. education was provided about A/E use for LE ADLs.    Patient is accompanied by:  Family member    Pertinent History  Per pt. chart. Pt. is a 49 y.o. female with history of HTN, T2DM, obesity who was admitted on 07/15/19 with onset of HA progressing to flaccid left hemiparesis a few  hours later.  CT head showed acute nonhemorrhagic infarct in right basal ganglia with hyperdense right MCA sign.  CT A/P head neck showed perfusion deficit with occluded right ICA and slow flow in M1.  She underwent cerebral angiogram with revascularization of right MCA with T1C1 revascularization and repair of right ICA with flow diverter device. Stroke felt to be embolic likely due to ICA dissection. Postprocedure head CT was negative for bleed repeat CTA head neck showed reocclusion of right ICA origin and occlusion of right ICA stent but now with patent right MCA. Pt. received inpatient rehab services for 3 weeks, and home health services.    Patient Stated Goals  Patient would like to be as independent as possible, return to her prior level of function    Currently in Pain?  No/denies      OT TREATMENT   Neuro muscular re-education:  Pt. worked on grasping, placing and stacking [place] style discs at the tabletop surface. Emphasis was placed on extending her digits off of the discs to release them. Pt. Required alternating weightbearing through a flat hand during the task.  Therapeutic Exercise:  Pt. worked on left shoulder stabilization exercises in supine with the shoulder flexed to 90 degrees, and the elbow in extension. Pt. was able toinitiate active shoulder stabilization responses minimally with support provided proximallywithassist in maintaining elbow in extension. Pt. Was able to actively stabilize her shoulder  And actively initiate repositioning of the shoulder.  Pt. was able to perform scapular protraction with support provided at the elbow, and forearm.Pt.worked on AAROM shoulder flexion in sidelying for a gravity eliminated plane using the UE Ranger. Pt. Performed active supination, as well as wrist, and digit extension with facilitation, and alternating weightbearing.  Neuromuscular re-ed:  Pt. worked on actively extending digits to relax, and release objects.  Pt.  worked on using her left hand to grasp, and stack [place] style discs. Pt. worked on alternating weightbearing with a flat hand at the tabletop between grasping tasks. Pt. Worked on wrist, and digit extension with facilitation.    Pt.continues to work on shoulder stabilization exercises at home. Pt.continues to makesteady progress.Pt.continues to be able toable to activate wrist extension, as well as activate digit extension with facilitation, and alternating weightbearing. Pt.however, wasable to initiateminimal 5th digit extensionfrom the tabletop surface today.Pt.is able to consistently initiate active left shoulder horizontal abduction, elbow flexion, and extension, forearm supination, wrist extension, digit extension with facilitation, and thumb abduction.Pt. Continues to work on improving her ability to consistently actively release objects. Pt. continues torequirework on improvingshoulder stabilization, wrist, and digitmovements for hand to face patterns, and to increase LUE engagement during ADLs, and IADL tasks.  OT Education - 12/30/19 1340    Education Details  LUE functioning, FInger ROM    Person(s) Educated  Patient    Methods  Demonstration;Explanation;Verbal cues    Comprehension  Returned demonstration;Verbalized understanding;Verbal cues required          OT Long Term Goals - 12/10/19 1619      OT LONG TERM GOAL #1   Title  Pt. will increase left shoulder flexion PROM by 20 degrees to assist with UE dressing    Baseline  Shoulder flexion sitting 38 degrees, supine: 18(135).Pt. continues to present with limited left shoulder PROM    Time  12    Period  Weeks    Status  On-going    Target Date  02/25/20      OT LONG TERM GOAL #2   Title  Pt. will be able to actively stabilize left shoulder for 10 seconds in supine with shoulder flexed to 90 degrees, and elbow in extension in preparation for functional reaching.     Baseline  Pt. was able to initate shoulder stabilization with her shoulder flexed to 90 degrees, and her elbow extended for 5 sec. each for 3  reps. Pt. continues to activate intermittent muscle responses.    Time  12    Period  Weeks    Status  On-going    Target Date  02/25/20      OT LONG TERM GOAL #3   Title  Pt. will independently perform hand to face patterns with the LUE in preparation for light self grooming tasks.    Baseline  Pt. is able to initiate performing hand to face patterns, and is able to reach her face, however is unable to sustain, and use her hand at her face.    Time  12    Period  Weeks    Status  On-going    Target Date  02/25/20      OT LONG TERM GOAL #4   Title  Pt. will initiate 10 degrees of active left wrist extension  in preparation for actively lifting her hand off of a surface.    Baseline  Wrist extension: 5(60)    Time  12    Period  Weeks    Status  On-going    Target Date  02/25/20      OT LONG TERM GOAL #5   Title  Pt. will initiate left hand digit extension in order to be able to actively release an objects during ADL tasks.    Baseline  Pt. presents with consistent 2nd, and 5th digit extension.    Time  12    Period  Weeks    Status  On-going    Target Date  02/25/20      OT LONG TERM GOAL #6   Title  Pt. increase left hand digit flexion in order to be able to hold her toothbrush while applying toothpaste with her left right hand.    Baseline  Pt. is initiating activating gross digit flexors.    Time  12    Period  Weeks    Status  On-going    Target Date  02/25/20            Plan - 12/30/19 1342    Clinical Impression Statement  Pt.continues to work on shoulder stabilization exercises at home. Pt.continues to makesteady progress.Pt.continues to be able toable to activate wrist extension, as well as activate digit extension with facilitation, and alternating weightbearing. Pt.however, wasable to  initiateminimal 5th  digit extensionfrom the tabletop surface today.Pt.is able to consistently initiate active left shoulder horizontal abduction, elbow flexion, and extension, forearm supination, wrist extension, digit extension with facilitation, and thumb abduction.Pt. Continues to work on improving her ability to consistently actively release objects. Pt. continues torequirework on improvingshoulder stabilization, wrist, and digitmovements for hand to face patterns, and to increase LUE engagement during ADLs, and IADL tasks.     OT Occupational Profile and History  Detailed Assessment- Review of Records and additional review of physical, cognitive, psychosocial history related to current functional performance    Occupational performance deficits (Please refer to evaluation for details):  ADL's;IADL's    Rehab Potential  Good    Clinical Decision Making  Several treatment options, min-mod task modification necessary    Comorbidities Affecting Occupational Performance:  May have comorbidities impacting occupational performance    Modification or Assistance to Complete Evaluation   Min-Moderate modification of tasks or assist with assess necessary to complete eval    OT Frequency  2x / week    OT Duration  12 weeks    OT Treatment/Interventions  Self-care/ADL training;Moist Heat;DME and/or AE instruction;Neuromuscular education;Therapeutic exercise;Therapeutic activities;Energy conservation;Patient/family education       Patient will benefit from skilled therapeutic intervention in order to improve the following deficits and impairments:           Visit Diagnosis: Muscle weakness (generalized)  Other lack of coordination    Problem List Patient Active Problem List   Diagnosis Date Noted  . E. coli UTI   . Diabetes mellitus type 2 in obese ([place])   . Chronic pain of right knee   . Flaccid monoplegia of upper extremity ([place])   . Hemiparesis affecting left side as late effect of stroke ([place])    . Acute ischemic right middle cerebral artery (MCA) stroke ([place]) 07/22/2019  . Carotid artery dissection  (HCC) s/p stent placement 07/21/2019  . Diabetes mellitus type II, uncontrolled ([place]) 07/21/2019  . Acute blood loss anemia 07/21/2019  . Leukocytosis 07/21/2019  . Stroke (cerebrum) ([place]) - R MCA infarct s/p tenecteplase and mechanical thrombectomy w/ d/t ICA dissection  07/15/2019  . Middle cerebral artery embolism, right 07/15/2019  . Controlled type 2 diabetes mellitus without complication, without long-term current use of insulin ([place]) 05/28/2017  . Attention deficit hyperactivity disorder (ADHD), combined type 04/18/2017  . Morbid obesity ([place]) 01/25/2016  . Umbilical hernia [id number]  . Hiatal hernia 12/08/2015  . History of Clostridium difficile colitis 06/19/2014  . HTN (hypertension) 12/23/2013  . PCOS (polycystic ovarian syndrome) 12/23/2013  . Bariatric surgery status 01/28/2013    Harrel Carina, MS, OTR/L 12/30/2019, 1:49 PM  [place] MAIN [facility] SERVICES [address] [place], [place], [address] Phone: [phone]   Fax:  [phone]  Name: Kathryn Coffey MRN: [id number] Date of Birth: 09/19/1970

## 2019-12-31 ENCOUNTER — Ambulatory Visit: Payer: No Typology Code available for payment source | Admitting: Occupational Therapy

## 2019-12-31 ENCOUNTER — Other Ambulatory Visit: Payer: Self-pay

## 2019-12-31 ENCOUNTER — Telehealth (HOSPITAL_COMMUNITY): Payer: Self-pay | Admitting: Radiology

## 2019-12-31 ENCOUNTER — Encounter: Payer: Self-pay | Admitting: Occupational Therapy

## 2019-12-31 DIAGNOSIS — M6281 Muscle weakness (generalized): Secondary | ICD-10-CM | POA: Diagnosis not present

## 2019-12-31 DIAGNOSIS — R278 Other lack of coordination: Secondary | ICD-10-CM

## 2019-12-31 NOTE — Therapy (Signed)
[place] MAIN [facility] SERVICES [address] [place], [place], [address] Phone: [phone]   Fax:  [phone]  Occupational Therapy Treatment  Patient Details  Name: Kathryn Coffey MRN: [id number] Date of Birth: 26-Jun-1971 Referring Provider (OT): Raulkar   Encounter Date: 12/31/2019  OT End of Session - 12/31/19 1407    Visit Number  26    Number of Visits  34    Date for OT Re-Evaluation  02/25/20    Authorization Type  Progress report period starting  on 12/10/2019    OT Start Time  1300    OT Stop Time  1345    OT Time Calculation (min)  45 min    Activity Tolerance  Patient tolerated treatment well    Behavior During Therapy  [facility] for tasks assessed/performed       Past Medical History:  Diagnosis Date  . Abdominal wall cellulitis 2013 and 2014  . Asthma    allergic to grasses  . B12 deficiency   . Complication of anesthesia   . Costochondritis   . Diabetes mellitus without complication ([place])   . Gastric anomaly    multiple small ulcers  . GERD (gastroesophageal reflux disease)   . Herpes 06/2018   POSSIBLY IN EYE- PT TAKING VALTREX AND WILL SEE OPTHAMOLOGIST TO SEE IF VALTREX IS WORKING  . History of abnormal cervical Pap smear   . History of Clostridium difficile colitis   . History of hiatal hernia   . History of kidney stones   . Hypertension   . IBS (irritable bowel syndrome)   . Migraine   . Morbid obesity ([place])    s/p attempted gastric banding now decompressed  . PCOS (polycystic ovarian syndrome)   . PONV (postoperative nausea and vomiting)    WITH SPINAL ONLY  . Stroke [facility])     Past Surgical History:  Procedure Laterality Date  . BREAST EXCISIONAL BIOPSY Left    cyst excision - Dr. Patty Sermons  . CESAREAN SECTION    . CHOLECYSTECTOMY    . COLONOSCOPY    . EAR CYST EXCISION Right 07/04/2018   Procedure: EXCISION GLOMUS TUMOR THUMBNAIL;  Surgeon: Corky Mull, MD;  Location: [facility] ORS;  Service:  Orthopedics;  Laterality: Right;  . ESOPHAGOGASTRODUODENOSCOPY    . ESOPHAGOGASTRODUODENOSCOPY (EGD) WITH PROPOFOL N/A 12/06/2015   Procedure: ESOPHAGOGASTRODUODENOSCOPY (EGD) WITH PROPOFOL;  Surgeon: Manya Silvas, MD;  Location: [facility] ENDOSCOPY;  Service: Endoscopy;  Laterality: N/A;  . IR ANGIO INTRA EXTRACRAN SEL INTERNAL CAROTID UNI L MOD SED  07/15/2019  . IR ANGIO VERTEBRAL SEL SUBCLAVIAN INNOMINATE UNI R MOD SED  07/15/2019  . IR CT HEAD LTD  07/15/2019  . IR INTRAVSC STENT CERV CAROTID W/O EMB-PROT MOD SED INC ANGIO  07/15/2019  . IR PERCUTANEOUS ART THROMBECTOMY/INFUSION INTRACRANIAL INC DIAG ANGIO  07/15/2019  . LAPAROSCOPIC GASTRIC BANDING    . LAPAROSCOPIC GASTRIC RESTRICTIVE DUODENAL PROCEDURE (DUODENAL SWITCH) N/A 01/25/2016   Procedure: LAPAROSCOPIC GASTRIC RESTRICTIVE DUODENAL PROCEDURE (DUODENAL SWITCH);  Surgeon: Ladora Daniel, MD;  Location: [facility] ORS;  Service: General;  Laterality: N/A;  . OVARY SURGERY     x2  . RADIOLOGY WITH ANESTHESIA N/A 07/15/2019   Procedure: IR WITH ANESTHESIA;  Surgeon: Luanne Bras, MD;  Location: [facility];  Service: Radiology;  Laterality: N/A;  . TONSILLECTOMY    . TUBAL LIGATION    . UMBILICAL HERNIA REPAIR N/A 01/25/2016   Procedure: LAPAROSCOPIC UMBILICAL HERNIA;  Surgeon: Ladora Daniel,  MD;  Location: [facility] ORS;  Service: General;  Laterality: N/A;  . UMBILICAL HERNIA REPAIR N/A 10/20/2016   Procedure: HERNIA REPAIR UMBILICAL ADULT;  Surgeon: Leonie Green, MD;  Location: [facility] ORS;  Service: General;  Laterality: N/A;    There were no vitals filed for this visit.  Subjective Assessment - 12/31/19 1406    Patient is accompanied by:  Family member    Pertinent History  Per pt. chart. Pt. is a 49 y.o. female with history of HTN, T2DM, obesity who was admitted on 07/15/19 with onset of HA progressing to flaccid left hemiparesis a few hours later.  CT head showed acute nonhemorrhagic infarct in right basal ganglia with hyperdense  right MCA sign.  CT A/P head neck showed perfusion deficit with occluded right ICA and slow flow in M1.  She underwent cerebral angiogram with revascularization of right MCA with T1C1 revascularization and repair of right ICA with flow diverter device. Stroke felt to be embolic likely due to ICA dissection. Postprocedure head CT was negative for bleed repeat CTA head neck showed reocclusion of right ICA origin and occlusion of right ICA stent but now with patent right MCA. Pt. received inpatient rehab services for 3 weeks, and home health services.    Patient Stated Goals  Patient would like to be as independent as possible, return to her prior level of function    Currently in Pain?  No/denies       OT TREATMENT   Neuro muscular re-education:  Pt. worked on using her left hand, 2nd digit  to slide flat 1" washers off the edge of a flat surface while positioning her thumb to receive the washers with the distal distal 2nd digit, and thumb pad.   Therapeutic Exercise:  Pt. worked on left shoulder stabilization exercises in supine with the shoulder flexed to 90 degrees, and the elbow in extension. Pt. was able toinitiate active shoulder stabilization responses minimally with support provided proximallywithassist in maintaining elbow in extension. Pt. Was able to actively stabilize her shoulder  And actively initiate repositioning of the shoulder.  Pt. was able to perform scapular protraction with support provided at the elbow, and forearm.Pt.worked on AAROM shoulder flexion in sidelying for a gravity eliminated plane using the UE Ranger.Pt. Performed active supination, as well as wrist, and digit extension with facilitation, and alternating weightbearing. Pt. Worked on AAROM for shoulder flexion at the flat tabletop surface, then followed by a small, and medium size incline wedge.   Pt.continues to workon shoulder stabilization exercises at home. Pt. continues to make steady progress  overall. Pt.continues to be able to activate wrist extension, as well as 2nd, 3rd, 4th, and 5th digit extension with facilitation, and alternating weightbearing.Pt.is able to consistently initiate active left shoulder horizontal abduction, elbow flexion, and extension, forearm supination, wrist extension, digit extension with facilitation, and thumb abduction.Pt. continues to work on improving her ability toconsistentlyactively release objects as bringing her thumb, and digits together to prepare for active grasp, and releasing objects.Pt. continues torequirework on improvingshoulder stabilization, wrist, and digitmovements for hand to face patterns, and to increase LUE engagement during ADLs, and IADL tasks.                     OT Education - 12/31/19 1407    Education Details  LUE functioning, FInger ROM    Person(s) Educated  Patient    Methods  Demonstration;Explanation;Verbal cues    Comprehension  Returned demonstration;Verbalized understanding;Verbal cues required  OT Long Term Goals - 12/10/19 1619      OT LONG TERM GOAL #1   Title  Pt. will increase left shoulder flexion PROM by 20 degrees to assist with UE dressing    Baseline  Shoulder flexion sitting 38 degrees, supine: 18(135).Pt. continues to present with limited left shoulder PROM    Time  12    Period  Weeks    Status  On-going    Target Date  02/25/20      OT LONG TERM GOAL #2   Title  Pt. will be able to actively stabilize left shoulder for 10 seconds in supine with shoulder flexed to 90 degrees, and elbow in extension in preparation for functional reaching.    Baseline  Pt. was able to initate shoulder stabilization with her shoulder flexed to 90 degrees, and her elbow extended for 5 sec. each for 3  reps. Pt. continues to activate intermittent muscle responses.    Time  12    Period  Weeks    Status  On-going    Target Date  02/25/20      OT LONG TERM GOAL #3   Title  Pt. will  independently perform hand to face patterns with the LUE in preparation for light self grooming tasks.    Baseline  Pt. is able to initiate performing hand to face patterns, and is able to reach her face, however is unable to sustain, and use her hand at her face.    Time  12    Period  Weeks    Status  On-going    Target Date  02/25/20      OT LONG TERM GOAL #4   Title  Pt. will initiate 10 degrees of active left wrist extension  in preparation for actively lifting her hand off of a surface.    Baseline  Wrist extension: 5(60)    Time  12    Period  Weeks    Status  On-going    Target Date  02/25/20      OT LONG TERM GOAL #5   Title  Pt. will initiate left hand digit extension in order to be able to actively release an objects during ADL tasks.    Baseline  Pt. presents with consistent 2nd, and 5th digit extension.    Time  12    Period  Weeks    Status  On-going    Target Date  02/25/20      OT LONG TERM GOAL #6   Title  Pt. increase left hand digit flexion in order to be able to hold her toothbrush while applying toothpaste with her left right hand.    Baseline  Pt. is initiating activating gross digit flexors.    Time  12    Period  Weeks    Status  On-going    Target Date  02/25/20            Plan - 12/31/19 1408    Clinical Impression Statement Pt.continues to workon shoulder stabilization exercises at home. Pt. continues to make steady progress overall. Pt.continues to be able to activate wrist extension, as well as 2nd, 3rd, 4th, and 5th digit extension with facilitation, and alternating weightbearing.Pt.is able to consistently initiate active left shoulder horizontal abduction, elbow flexion, and extension, forearm supination, wrist extension, digit extension with facilitation, and thumb abduction.Pt. continues to work on improving her ability toconsistentlyactively release objects as bringing her thumb, and digits together to prepare for active grasp,  and  releasing objects.Pt. continues torequirework on improvingshoulder stabilization, wrist, and digitmovements for hand to face patterns, and to increase LUE engagement during ADLs, and IADL tasks.   OT Occupational Profile and History  Detailed Assessment- Review of Records and additional review of physical, cognitive, psychosocial history related to current functional performance    Occupational performance deficits (Please refer to evaluation for details):  ADL's;IADL's    Rehab Potential  Good    Clinical Decision Making  Several treatment options, min-mod task modification necessary    Comorbidities Affecting Occupational Performance:  May have comorbidities impacting occupational performance    Modification or Assistance to Complete Evaluation   Min-Moderate modification of tasks or assist with assess necessary to complete eval    OT Frequency  2x / week    OT Duration  12 weeks    OT Treatment/Interventions  Self-care/ADL training;Moist Heat;DME and/or AE instruction;Neuromuscular education;Therapeutic exercise;Therapeutic activities;Energy conservation;Patient/family education    Consulted and Agree with Plan of Care  Patient       Patient will benefit from skilled therapeutic intervention in order to improve the following deficits and impairments:           Visit Diagnosis: Muscle weakness (generalized)  Other lack of coordination    Problem List Patient Active Problem List   Diagnosis Date Noted  . E. coli UTI   . Diabetes mellitus type 2 in obese ([place])   . Chronic pain of right knee   . 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 Hemiparesis affecting left side as late effect of stroke ([place])   . Acute ischemic right middle cerebral artery (MCA) stroke ([place]) 07/22/2019  . Carotid artery dissection  (H[facility]) s/p stent placement 07/21/2019  . Diabetes mellitus type II, uncontrolled ([place]) 07/21/2019  . Acute blood loss anemia 07/21/2019  . Leukocytosis 07/21/2019  .  Stroke (cerebrum) ([place]) - R MCA infarct s/p tenecteplase and mechanical thrombectomy w/ d/t ICA dissection  07/15/2019  . Middle cerebral artery embolism, right 07/15/2019  . Controlled type 2 diabetes mellitus without complication, without long-term current use of insulin ([place]) 05/28/2017  . Attention deficit hyperactivity disorder (ADHD), combined type 04/18/2017  . Morbid obesity ([place]) 01/25/2016  . Umbilical hernia [id number]  . Hiatal hernia 12/08/2015  . History of Clostridium difficile colitis 06/19/2014  . HTN (hypertension) 12/23/2013  . PCOS (polycystic ovarian syndrome) 12/23/2013  . Bariatric surgery status 01/28/2013    Harrel Carina, MS, OTR/L 12/31/2019, 2:14 PM  [place] MAIN [facility] SERVICES [address] [place], [place], [address] Phone: [phone]   Fax:  [phone]  Name: Kathryn Coffey MRN: [id number] Date of Birth: [birth year]

## 2019-12-31 NOTE — Telephone Encounter (Signed)
Returned pt's call concerning new sx and when her f/u is due. Left VM for her to call back. JM

## 2020-01-01 ENCOUNTER — Telehealth: Payer: Self-pay | Admitting: Student

## 2020-01-01 NOTE — Telephone Encounter (Signed)
Patient called stating that she has had fatigue requiring daily naps for the past 2 weeks.  Reports stable residual stroke symptoms of blurry vision, foggy-headedness.  Denies headaches, weakness, lateralization to either extremity, fever, chills, shortness of breath, cough, or trouble walking. Also denies Covid exposure, sickness in other family members at home, severe seasonal allergies, etc..  She reports she had similar fatigued prior to her stroke back in November.   She is due for her 6 month US Carotid soon, however after review and discussion with Dr. Estanislado Pandy, plan made to proceed with CTA Head and Neck now.  Discussed with patient who is agreeable.  She is aware that she should seek emergent evaluation if any acute change or worsening of symptoms occurs.    She understands schedulers will call with date and time of appointment.   Brynda Greathouse, MS RD PA-C

## 2020-01-04 ENCOUNTER — Other Ambulatory Visit: Payer: Self-pay

## 2020-01-05 ENCOUNTER — Ambulatory Visit: Payer: No Typology Code available for payment source | Admitting: Occupational Therapy

## 2020-01-05 ENCOUNTER — Other Ambulatory Visit: Payer: Self-pay

## 2020-01-05 ENCOUNTER — Encounter: Payer: No Typology Code available for payment source | Admitting: Speech Pathology

## 2020-01-05 DIAGNOSIS — R278 Other lack of coordination: Secondary | ICD-10-CM

## 2020-01-05 DIAGNOSIS — M6281 Muscle weakness (generalized): Secondary | ICD-10-CM

## 2020-01-05 DIAGNOSIS — R262 Difficulty in walking, not elsewhere classified: Secondary | ICD-10-CM

## 2020-01-05 MED ORDER — MIRABEGRON ER 25 MG PO TB24: 25.0000 mg | ORAL_TABLET | Freq: Every day | ORAL | 0 refills | Status: DC

## 2020-01-06 ENCOUNTER — Other Ambulatory Visit (HOSPITAL_COMMUNITY): Payer: Self-pay | Admitting: Interventional Radiology

## 2020-01-06 ENCOUNTER — Encounter: Payer: Self-pay | Admitting: Occupational Therapy

## 2020-01-06 ENCOUNTER — Telehealth: Payer: Self-pay | Admitting: Student

## 2020-01-06 DIAGNOSIS — I639 Cerebral infarction, unspecified: Secondary | ICD-10-CM

## 2020-01-06 NOTE — Telephone Encounter (Signed)
NIR.   Patient is scheduled for a CTA head/neck (with contrast) for 01/09/2020. She has an iodine allergy causing hives, requiring pre-medications for imaging scans with iodine dye.   Faxed filled prescriptions to [facility] [phone]) at [phone]: 1- Prednisone 50 mg tablets; take one tablet by mouth 13 hours, 7 hours, and 1 hour prior to procedure on 01/09/2020; dispense 3 tablets with 0 refills. 2- Benadryl 50 mg tablets; take one tablet by mouth 1 hour prior to procedure on 01/09/2020; dispense 1 tablet with 0 refills.   Bea Graff Nickalas Mccarrick, PA-C 01/06/2020, 1:47 PM

## 2020-01-06 NOTE — Therapy (Signed)
[place] MAIN [facility] SERVICES [address] [place], [place], [address] Phone: [phone]   Fax:  [phone]  Occupational Therapy Treatment  Patient Details  Name: Kathryn Coffey MRN: [id number] Date of Birth: Feb 11, 1971 Referring Provider (OT): Raulkar   Encounter Date: 01/05/2020  OT End of Session - 01/06/20 1322    Visit Number  25    Number of Visits  63    Date for OT Re-Evaluation  02/25/20    Authorization Type  Progress report period starting  on 12/10/2019    Authorization Time Period  FOTO    OT Start Time  1305    OT Stop Time  1347    OT Time Calculation (min)  42 min    Activity Tolerance  Patient tolerated treatment well    Behavior During Therapy  [facility] for tasks assessed/performed       Past Medical History:  Diagnosis Date  . Abdominal wall cellulitis 2013 and 2014  . Asthma    allergic to grasses  . B12 deficiency   . Complication of anesthesia   . Costochondritis   . Diabetes mellitus without complication ([place])   . Gastric anomaly    multiple small ulcers  . GERD (gastroesophageal reflux disease)   . Herpes 06/2018   POSSIBLY IN EYE- PT TAKING VALTREX AND WILL SEE OPTHAMOLOGIST TO SEE IF VALTREX IS WORKING  . History of abnormal cervical Pap smear   . History of Clostridium difficile colitis   . History of hiatal hernia   . History of kidney stones   . Hypertension   . IBS (irritable bowel syndrome)   . Migraine   . Morbid obesity ([place])    s/p attempted gastric banding now decompressed  . PCOS (polycystic ovarian syndrome)   . PONV (postoperative nausea and vomiting)    WITH SPINAL ONLY  . Stroke [facility])     Past Surgical History:  Procedure Laterality Date  . BREAST EXCISIONAL BIOPSY Left    cyst excision - Dr. Patty Sermons  . CESAREAN SECTION    . CHOLECYSTECTOMY    . COLONOSCOPY    . EAR CYST EXCISION Right 07/04/2018   Procedure: EXCISION GLOMUS TUMOR THUMBNAIL;  Surgeon: Corky Mull, MD;   Location: [facility] ORS;  Service: Orthopedics;  Laterality: Right;  . ESOPHAGOGASTRODUODENOSCOPY    . ESOPHAGOGASTRODUODENOSCOPY (EGD) WITH PROPOFOL N/A 12/06/2015   Procedure: ESOPHAGOGASTRODUODENOSCOPY (EGD) WITH PROPOFOL;  Surgeon: Manya Silvas, MD;  Location: [facility] ENDOSCOPY;  Service: Endoscopy;  Laterality: N/A;  . IR ANGIO INTRA EXTRACRAN SEL INTERNAL CAROTID UNI L MOD SED  07/15/2019  . IR ANGIO VERTEBRAL SEL SUBCLAVIAN INNOMINATE UNI R MOD SED  07/15/2019  . IR CT HEAD LTD  07/15/2019  . IR INTRAVSC STENT CERV CAROTID W/O EMB-PROT MOD SED INC ANGIO  07/15/2019  . IR PERCUTANEOUS ART THROMBECTOMY/INFUSION INTRACRANIAL INC DIAG ANGIO  07/15/2019  . LAPAROSCOPIC GASTRIC BANDING    . LAPAROSCOPIC GASTRIC RESTRICTIVE DUODENAL PROCEDURE (DUODENAL SWITCH) N/A 01/25/2016   Procedure: LAPAROSCOPIC GASTRIC RESTRICTIVE DUODENAL PROCEDURE (DUODENAL SWITCH);  Surgeon: Ladora Daniel, MD;  Location: [facility] ORS;  Service: General;  Laterality: N/A;  . OVARY SURGERY     x2  . RADIOLOGY WITH ANESTHESIA N/A 07/15/2019   Procedure: IR WITH ANESTHESIA;  Surgeon: Luanne Bras, MD;  Location: [place];  Service: Radiology;  Laterality: N/A;  . TONSILLECTOMY    . TUBAL LIGATION    . UMBILICAL HERNIA REPAIR N/A 01/25/2016   Procedure:  LAPAROSCOPIC UMBILICAL HERNIA;  Surgeon: Ladora Daniel, MD;  Location: [facility] ORS;  Service: General;  Laterality: N/A;  . UMBILICAL HERNIA REPAIR N/A 10/20/2016   Procedure: HERNIA REPAIR UMBILICAL ADULT;  Surgeon: Leonie Green, MD;  Location: [facility] ORS;  Service: General;  Laterality: N/A;    There were no vitals filed for this visit.  Subjective Assessment - 01/06/20 1320    Subjective   Patient reports she is doing well, continues to try to move her fingers as much as she can, working on range of motion and reaching.  States, "everything just seems so slow"    Pertinent History  Per pt. chart. Pt. is a 49 y.o. female with history of HTN, T2DM, obesity who was admitted  on 07/15/19 with onset of HA progressing to flaccid left hemiparesis a few hours later.  CT head showed acute nonhemorrhagic infarct in right basal ganglia with hyperdense right MCA sign.  CT A/P head neck showed perfusion deficit with occluded right ICA and slow flow in M1.  She underwent cerebral angiogram with revascularization of right MCA with T1C1 revascularization and repair of right ICA with flow diverter device. Stroke felt to be embolic likely due to ICA dissection. Postprocedure head CT was negative for bleed repeat CTA head neck showed reocclusion of right ICA origin and occlusion of right ICA stent but now with patent right MCA. Pt. received inpatient rehab services for 3 weeks, and home health services.    Patient Stated Goals  Patient would like to be as independent as possible, return to her prior level of function    Currently in Pain?  No/denies    Pain Score  0-No pain         Patient seen this date for focus on left UE function as follows to promote neuromuscular reeducation: Finger extension passively followed by active assist with therapist taking the patient through the motions then working on active motion in available ranges along with verbal cues.   Finger ABD/ADD as a whole hand and individual digits following same protocol.  Patient continues to have some flexor tone present when performing finger exercises and is able to weight bear and switch between active and weight bearing to promote finger extension.    Patient seen for thumb extension and ABD with cues, this continues to be the strongest motion of all digits currently for patient.  Attempts at finger tapping and patient demonstrating improvements since seeing this therapist in the past.  Patient performing AAROM/AROM of left wrist for flexion and extension this date.  Attempts at using SAEBO ball tower, 1 and 2nd level in sitting and standing with assist from therapist for guiding of left UE to grasp loop of ball to move  from one level to the next, therapist providing guiding at elbow and wrist, cues to decrease shoulder substitution of movements.  Therapist then worked with patient to facilitate opening and grasp of ball side of the looped balls to place onto level, when moving into full supination, patient tends to lose grip of ball and requires assist to readjust.  Performed supination/pronation exercises with use of cone and some guiding from therapist.  Reaching forwards with left UE on tabletop then diagonal patterns with therapist guiding.   Response to tx:  Patient continues to make good progress, some improvements noted in isolated finger movements, still has difficulty with PIP and DIP active extension.  Assist with reaching with guiding from therapist.  Continues to work towards grasp and release  patterns of objects as well as reaching to promote increased shoulder ROM.  Tends to still move towards flexor patterns but able to respond to guiding techniques from therapist.  Continued use of weight bearing for finger extension.  Patient reports a raised area on the dorsum of her left hand, worse at times and may follow up with MD.  Continue to work towards goals in plan of care to increase independence in daily tasks.                 OT Education - 01/06/20 1322    Education Details  finger ROM, grasping patterns, reaching    Person(s) Educated  Patient    Methods  Demonstration;Explanation;Verbal cues    Comprehension  Returned demonstration;Verbalized understanding;Verbal cues required          OT Long Term Goals - 12/10/19 1619      OT LONG TERM GOAL #1   Title  Pt. will increase left shoulder flexion PROM by 20 degrees to assist with UE dressing    Baseline  Shoulder flexion sitting 38 degrees, supine: 18(135).Pt. continues to present with limited left shoulder PROM    Time  12    Period  Weeks    Status  On-going    Target Date  02/25/20      OT LONG TERM GOAL #2   Title  Pt.  will be able to actively stabilize left shoulder for 10 seconds in supine with shoulder flexed to 90 degrees, and elbow in extension in preparation for functional reaching.    Baseline  Pt. was able to initate shoulder stabilization with her shoulder flexed to 90 degrees, and her elbow extended for 5 sec. each for 3  reps. Pt. continues to activate intermittent muscle responses.    Time  12    Period  Weeks    Status  On-going    Target Date  02/25/20      OT LONG TERM GOAL #3   Title  Pt. will independently perform hand to face patterns with the LUE in preparation for light self grooming tasks.    Baseline  Pt. is able to initiate performing hand to face patterns, and is able to reach her face, however is unable to sustain, and use her hand at her face.    Time  12    Period  Weeks    Status  On-going    Target Date  02/25/20      OT LONG TERM GOAL #4   Title  Pt. will initiate 10 degrees of active left wrist extension  in preparation for actively lifting her hand off of a surface.    Baseline  Wrist extension: 5(60)    Time  12    Period  Weeks    Status  On-going    Target Date  02/25/20      OT LONG TERM GOAL #5   Title  Pt. will initiate left hand digit extension in order to be able to actively release an objects during ADL tasks.    Baseline  Pt. presents with consistent 2nd, and 5th digit extension.    Time  12    Period  Weeks    Status  On-going    Target Date  02/25/20      OT LONG TERM GOAL #6   Title  Pt. increase left hand digit flexion in order to be able to hold her toothbrush while applying toothpaste with her left right hand.  Baseline  Pt. is initiating activating gross digit flexors.    Time  12    Period  Weeks    Status  On-going    Target Date  02/25/20            Plan - 01/06/20 1323    Clinical Impression Statement  Patient continues to make good progress, some improvements noted in isolated finger movements, still has difficulty with PIP and  DIP active extension.  Assist with reaching with guiding from therapist.  Continues to work towards grasp and release patterns of objects as well as reaching to promote increased shoulder ROM.  Tends to still move towards flexor patterns but able to respond to guiding techniques from therapist.  Continued use of weight bearing for finger extension.  Patient reports a raised area on the dorsum of her left hand, worse at times and may follow up with MD.  Continue to work towards goals in plan of care to increase independence in daily tasks.    OT Occupational Profile and History  Detailed Assessment- Review of Records and additional review of physical, cognitive, psychosocial history related to current functional performance    Occupational performance deficits (Please refer to evaluation for details):  ADL's;IADL's    Rehab Potential  Good    Clinical Decision Making  Several treatment options, min-mod task modification necessary    Comorbidities Affecting Occupational Performance:  May have comorbidities impacting occupational performance    Modification or Assistance to Complete Evaluation   Min-Moderate modification of tasks or assist with assess necessary to complete eval    OT Frequency  2x / week    OT Duration  12 weeks    OT Treatment/Interventions  Self-care/ADL training;Moist Heat;DME and/or AE instruction;Neuromuscular education;Therapeutic exercise;Therapeutic activities;Energy conservation;Patient/family education    Consulted and Agree with Plan of Care  Patient       Patient will benefit from skilled therapeutic intervention in order to improve the following deficits and impairments:           Visit Diagnosis: Other lack of coordination  Muscle weakness (generalized)  Difficulty in walking, not elsewhere classified    Problem List Patient Active Problem List   Diagnosis Date Noted  . E. coli UTI   . Diabetes mellitus type 2 in obese ([place])   . Chronic pain of right knee    . 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 Hemiparesis affecting left side as late effect of stroke ([place])   . Acute ischemic right middle cerebral artery (MCA) stroke ([place]) 07/22/2019  . Carotid artery dissection  (HCC) s/p stent placement 07/21/2019  . Diabetes mellitus type II, uncontrolled ([place]) 07/21/2019  . Acute blood loss anemia 07/21/2019  . Leukocytosis 07/21/2019  . Stroke (cerebrum) ([place]) - R MCA infarct s/p tenecteplase and mechanical thrombectomy w/ d/t ICA dissection  07/15/2019  . Middle cerebral artery embolism, right 07/15/2019  . Controlled type 2 diabetes mellitus without complication, without long-term current use of insulin ([place]) 05/28/2017  . Attention deficit hyperactivity disorder (ADHD), combined type 04/18/2017  . Morbid obesity ([place]) 01/25/2016  . Umbilical hernia [id number]  . Hiatal hernia 12/08/2015  . History of Clostridium difficile colitis 06/19/2014  . HTN (hypertension) 12/23/2013  . PCOS (polycystic ovarian syndrome) 12/23/2013  . Bariatric surgery status 01/28/2013   Achilles Dunk, OTR/L, CLT  Esteven Overfelt 01/06/2020, 1:37 PM  [place] MAIN [facility] SERVICES [address] [place], [place], [address] Phone: [phone]   Fax:  [phone]  Name: Kathryn Coffey MRN: [id number] Date of Birth: [birth year]

## 2020-01-07 ENCOUNTER — Ambulatory Visit: Payer: No Typology Code available for payment source | Admitting: Occupational Therapy

## 2020-01-07 ENCOUNTER — Encounter: Payer: Self-pay | Admitting: Occupational Therapy

## 2020-01-07 ENCOUNTER — Other Ambulatory Visit: Payer: Self-pay

## 2020-01-07 ENCOUNTER — Ambulatory Visit: Payer: No Typology Code available for payment source

## 2020-01-07 DIAGNOSIS — R278 Other lack of coordination: Secondary | ICD-10-CM

## 2020-01-07 DIAGNOSIS — M6281 Muscle weakness (generalized): Secondary | ICD-10-CM

## 2020-01-07 DIAGNOSIS — R262 Difficulty in walking, not elsewhere classified: Secondary | ICD-10-CM

## 2020-01-07 NOTE — Therapy (Signed)
[place] MAIN [facility] SERVICES [address] [place], [place], [address] Phone: [phone]   Fax:  [phone]  Physical Therapy Treatment  Patient Details  Name: Kathryn Coffey MRN: [id number] Date of Birth: 12/31/1970 No data recorded  Encounter Date: 01/07/2020  PT End of Session - 01/07/20 1540    Visit Number  18    Number of Visits  49    Date for PT Re-Evaluation  01/26/20    PT Start Time  1740    PT Stop Time  1600    PT Time Calculation (min)  30 min    Equipment Utilized During Treatment  Gait belt    Activity Tolerance  Patient tolerated treatment well    Behavior During Therapy  WFL for tasks assessed/performed       Past Medical History:  Diagnosis Date  . Abdominal wall cellulitis 2013 and 2014  . Asthma    allergic to grasses  . B12 deficiency   . Complication of anesthesia   . Costochondritis   . Diabetes mellitus without complication ([place])   . Gastric anomaly    multiple small ulcers  . GERD (gastroesophageal reflux disease)   . Herpes 06/2018   POSSIBLY IN EYE- PT TAKING VALTREX AND WILL SEE OPTHAMOLOGIST TO SEE IF VALTREX IS WORKING  . History of abnormal cervical Pap smear   . History of Clostridium difficile colitis   . History of hiatal hernia   . History of kidney stones   . Hypertension   . IBS (irritable bowel syndrome)   . Migraine   . Morbid obesity ([place])    s/p attempted gastric banding now decompressed  . PCOS (polycystic ovarian syndrome)   . PONV (postoperative nausea and vomiting)    WITH SPINAL ONLY  . Stroke [facility])     Past Surgical History:  Procedure Laterality Date  . BREAST EXCISIONAL BIOPSY Left    cyst excision - Dr. Patty Sermons  . CESAREAN SECTION    . CHOLECYSTECTOMY    . COLONOSCOPY    . EAR CYST EXCISION Right 07/04/2018   Procedure: EXCISION GLOMUS TUMOR THUMBNAIL;  Surgeon: Corky Mull, MD;  Location: [facility] ORS;  Service: Orthopedics;  Laterality: Right;  .  ESOPHAGOGASTRODUODENOSCOPY    . ESOPHAGOGASTRODUODENOSCOPY (EGD) WITH PROPOFOL N/A 12/06/2015   Procedure: ESOPHAGOGASTRODUODENOSCOPY (EGD) WITH PROPOFOL;  Surgeon: Manya Silvas, MD;  Location: [facility] ENDOSCOPY;  Service: Endoscopy;  Laterality: N/A;  . IR ANGIO INTRA EXTRACRAN SEL INTERNAL CAROTID UNI L MOD SED  07/15/2019  . IR ANGIO VERTEBRAL SEL SUBCLAVIAN INNOMINATE UNI R MOD SED  07/15/2019  . IR CT HEAD LTD  07/15/2019  . IR INTRAVSC STENT CERV CAROTID W/O EMB-PROT MOD SED INC ANGIO  07/15/2019  . IR PERCUTANEOUS ART THROMBECTOMY/INFUSION INTRACRANIAL INC DIAG ANGIO  07/15/2019  . LAPAROSCOPIC GASTRIC BANDING    . LAPAROSCOPIC GASTRIC RESTRICTIVE DUODENAL PROCEDURE (DUODENAL SWITCH) N/A 01/25/2016   Procedure: LAPAROSCOPIC GASTRIC RESTRICTIVE DUODENAL PROCEDURE (DUODENAL SWITCH);  Surgeon: Ladora Daniel, MD;  Location: [facility] ORS;  Service: General;  Laterality: N/A;  . OVARY SURGERY     x2  . RADIOLOGY WITH ANESTHESIA N/A 07/15/2019   Procedure: IR WITH ANESTHESIA;  Surgeon: Luanne Bras, MD;  Location: [place];  Service: Radiology;  Laterality: N/A;  . TONSILLECTOMY    . TUBAL LIGATION    . UMBILICAL HERNIA REPAIR N/A 01/25/2016   Procedure: LAPAROSCOPIC UMBILICAL HERNIA;  Surgeon: Ladora Daniel, MD;  Location: [facility] ORS;  Service: General;  Laterality: N/A;  . UMBILICAL HERNIA REPAIR N/A 10/20/2016   Procedure: HERNIA REPAIR UMBILICAL ADULT;  Surgeon: Leonie Green, MD;  Location: [facility] ORS;  Service: General;  Laterality: N/A;    There were no vitals filed for this visit.  Subjective Assessment - 01/07/20 1606    Subjective  Patient reports she is doing well today. No resting pain reported upon arrival. She is wearing her knee brace on the L side which continues with her hyperextension. Compliant with HEP. No specific questions or concerns currently. She arrived late because she was coming from an eye appointment    Pertinent History  Patient was in the hospital [place]  and then in patient rehab 3 weeks. She was dicharged 08/13/19 and then HHPT and was discharged 09/03/19. She has not had any falls.    Currently in Pain?  No/denies          TREATMENT   Ther-ex NuStep L2 x 5 minutes for warm-up during history (3 minutes unbilled); Precor single leg press LLE: 40# x 20, 55# x 10 (muscle failure obtained); Matrix resisted gait with 17.5# x 2 each direction forward/backward/R lateral/L lateral; 6" alternating step-ups with BUE support x 10 on each side; Standing heel raises with BUE support x 20; Sit to stand with RLE on 6" step to bias LLE, no UE assist 2 x 10;   Pt educated throughout session about proper posture and technique with exercises. Improved exercise technique, movement at target joints, use of target muscles after min to mod verbal, visual, tactile cues.   Pt arrived late due to an eye appointment so session had to be abbreviated accordingly. She demonstrates excellent motivation during sessiontoday. Progressed resistance on leg press as well as resisted gait. Intermittent rest breaks provided between sets. She would like to know when she can stop wearing her L knee brace and pt advised that in future session will evaluate gait out of brace.Pt encouraged to continue HEP. Pt will benefit from PT services to address deficits in strength, balance, and mobility in order to return to full function at home.                          PT Short Term Goals - 11/03/19 1356      PT SHORT TERM GOAL #1   Title  Patient will be independent in home exercise program to improve strength/mobility for better functional independence with ADLs.    Baseline  11/03/19: Pt performing HEP at least 5d/wk    Time  6    Period  Weeks    Status  Partially Met    Target Date  12/15/19      PT SHORT TERM GOAL #2   Title  Patient (> 70 years old) will complete five times sit to stand test in < 15 seconds indicating an increased LE strength  and improved balance.    Baseline  eval: 15.28s; 11/03/19: 12.2s    Time  6    Period  Weeks    Status  Achieved    Target Date  10/20/19        PT Long Term Goals - 11/03/19 1359      PT LONG TERM GOAL #1   Title  Patient will increase Berg Balance score by > 6 points to demonstrate decreased fall risk during functional activities.    Baseline  09/08/19: 48/56; 11/03/19: 51/56    Time  12  Period  Weeks    Status  Partially Met    Target Date  01/26/20      PT LONG TERM GOAL #2   Title  Patient will increase six minute walk test distance to >1200 for progression to community ambulator and improve gait ability    Baseline  09/08/19: 525', 11/03/19: 817';    Time  12    Period  Weeks    Status  Partially Met    Target Date  01/26/20      PT LONG TERM GOAL #3   Title  Patient will increase 10 meter walk test to >1.66ms as to improve gait speed for better community ambulation and to reduce fall risk    Baseline  09/08/19:  0.51 m/s; 11/03/19: self-selected: 13.2s = 0.76 m/s, fastest: 12.5s = 0.8 m/s    Time  12    Period  Weeks    Status  Partially Met    Target Date  01/26/20            Plan - 01/07/20 1541    Clinical Impression Statement  Pt arrived late due to an eye appointment so session had to be abbreviated accordingly. She demonstrates excellent motivation during session today. Progressed resistance on leg press as well as resisted gait. Intermittent rest breaks provided between sets. She would like to know when she can stop wearing her L knee brace and pt advised that in future session will evaluate gait out of brace. Pt encouraged to continue HEP. Pt will benefit from PT services to address deficits in strength, balance, and mobility in order to return to full function at home.    Personal Factors and Comorbidities  Comorbidity 1    Comorbidities  diabetes, high blood pressure, reflux,    Examination-Activity Limitations  Carry;Caring for Others;Bend;Locomotion  Level;Self Feeding;Sleep;Toileting    Examination-Participation Restrictions  Cleaning;Community Activity;Driving;Laundry;Shop    Stability/Clinical Decision Making  Stable/Uncomplicated    Rehab Potential  Good    PT Frequency  2x / week    PT Duration  12 weeks    PT Treatment/Interventions  Manual techniques;Functional mobility training;Stair training;Gait training;Electrical Stimulation;Moist Heat;Ultrasound;Therapeutic exercise;Balance training;Neuromuscular re-education    PT Next Visit Plan  strengthening and balance    PT Home Exercise Plan  standing hip abd,    Consulted and Agree with Plan of Care  Patient       Patient will benefit from skilled therapeutic intervention in order to improve the following deficits and impairments:  Abnormal gait, Decreased knowledge of use of DME, Dizziness, Pain, Decreased coordination, Impaired UE functional use, Decreased strength, Decreased endurance, Decreased activity tolerance, Decreased balance, Difficulty walking, Obesity  Visit Diagnosis: Muscle weakness (generalized)  Difficulty in walking, not elsewhere classified     Problem List Patient Active Problem List   Diagnosis Date Noted  . E. coli UTI   . Diabetes mellitus type 2 in obese ([place]   . Chronic pain of right knee   . Flaccid monoplegia of upper extremity ([place]   . Hemiparesis affecting left side as late effect of stroke ([place]   . Acute ischemic right middle cerebral artery (MCA) stroke ([place] 07/22/2019  . Carotid artery dissection  (HCC) s/p stent placement 07/21/2019  . Diabetes mellitus type II, uncontrolled ([place] 07/21/2019  . Acute blood loss anemia 07/21/2019  . Leukocytosis 07/21/2019  . Stroke (cerebrum) ([place] - R MCA infarct s/p tenecteplase and mechanical thrombectomy w/ d/t ICA dissection  07/15/2019  . Middle cerebral artery embolism,  right 07/15/2019  . Controlled type 2 diabetes mellitus without complication, without long-term current use of insulin ([place])  05/28/2017  . Attention deficit hyperactivity disorder (ADHD), combined type 04/18/2017  . Morbid obesity ([place]) 01/25/2016  . Umbilical hernia 53/20/2334  . Hiatal hernia 12/08/2015  . History of Clostridium difficile colitis 06/19/2014  . HTN (hypertension) 12/23/2013  . PCOS (polycystic ovarian syndrome) 12/23/2013  . Bariatric surgery status 01/28/2013   Phillips Grout PT, DPT, GCS  Pharaoh Pio 01/07/2020, 4:11 PM  [place] MAIN [facility] SERVICES [address] [place], [place], [address] Phone: [phone]   Fax:  [phone]  Name: Kathryn Coffey MRN: [id number] Date of Birth: 1971/01/25

## 2020-01-07 NOTE — Therapy (Signed)
[place] MAIN [facility] SERVICES [address] [place], [place], [address] Phone: [phone]   Fax:  [phone]  Occupational Therapy Treatment  Patient Details  Name: Kathryn Coffey MRN: [id number] Date of Birth: [birth year] Referring Provider (OT): Raulkar   Encounter Date: 01/07/2020  OT End of Session - 01/07/20 1608    Visit Number  25    Number of Visits  46    Date for OT Re-Evaluation  02/25/20    Authorization Type  Progress report period starting  on 12/10/2019    Authorization Time Period  FOTO    OT Start Time  1603    OT Stop Time  1645    OT Time Calculation (min)  42 min    Activity Tolerance  Patient tolerated treatment well    Behavior During Therapy  [facility] for tasks assessed/performed       Past Medical History:  Diagnosis Date  . Abdominal wall cellulitis 2013 and 2014  . Asthma    allergic to grasses  . B12 deficiency   . Complication of anesthesia   . Costochondritis   . Diabetes mellitus without complication ([place])   . Gastric anomaly    multiple small ulcers  . GERD (gastroesophageal reflux disease)   . Herpes 06/2018   POSSIBLY IN EYE- PT TAKING VALTREX AND WILL SEE OPTHAMOLOGIST TO SEE IF VALTREX IS WORKING  . History of abnormal cervical Pap smear   . History of Clostridium difficile colitis   . History of hiatal hernia   . History of kidney stones   . Hypertension   . IBS (irritable bowel syndrome)   . Migraine   . Morbid obesity ([place])    s/p attempted gastric banding now decompressed  . PCOS (polycystic ovarian syndrome)   . PONV (postoperative nausea and vomiting)    WITH SPINAL ONLY  . Stroke [facility])     Past Surgical History:  Procedure Laterality Date  . BREAST EXCISIONAL BIOPSY Left    cyst excision - Dr. Patty Sermons  . CESAREAN SECTION    . CHOLECYSTECTOMY    . COLONOSCOPY    . EAR CYST EXCISION Right 07/04/2018   Procedure: EXCISION GLOMUS TUMOR THUMBNAIL;  Surgeon: Corky Mull, MD;   Location: [facility] ORS;  Service: Orthopedics;  Laterality: Right;  . ESOPHAGOGASTRODUODENOSCOPY    . ESOPHAGOGASTRODUODENOSCOPY (EGD) WITH PROPOFOL N/A 12/06/2015   Procedure: ESOPHAGOGASTRODUODENOSCOPY (EGD) WITH PROPOFOL;  Surgeon: Manya Silvas, MD;  Location: [facility] ENDOSCOPY;  Service: Endoscopy;  Laterality: N/A;  . IR ANGIO INTRA EXTRACRAN SEL INTERNAL CAROTID UNI L MOD SED  07/15/2019  . IR ANGIO VERTEBRAL SEL SUBCLAVIAN INNOMINATE UNI R MOD SED  07/15/2019  . IR CT HEAD LTD  07/15/2019  . IR INTRAVSC STENT CERV CAROTID W/O EMB-PROT MOD SED INC ANGIO  07/15/2019  . IR PERCUTANEOUS ART THROMBECTOMY/INFUSION INTRACRANIAL INC DIAG ANGIO  07/15/2019  . LAPAROSCOPIC GASTRIC BANDING    . LAPAROSCOPIC GASTRIC RESTRICTIVE DUODENAL PROCEDURE (DUODENAL SWITCH) N/A 01/25/2016   Procedure: LAPAROSCOPIC GASTRIC RESTRICTIVE DUODENAL PROCEDURE (DUODENAL SWITCH);  Surgeon: Ladora Daniel, MD;  Location: [facility] ORS;  Service: General;  Laterality: N/A;  . OVARY SURGERY     x2  . RADIOLOGY WITH ANESTHESIA N/A 07/15/2019   Procedure: IR WITH ANESTHESIA;  Surgeon: Luanne Bras, MD;  Location: [place];  Service: Radiology;  Laterality: N/A;  . TONSILLECTOMY    . TUBAL LIGATION    . UMBILICAL HERNIA REPAIR N/A 01/25/2016   Procedure:  LAPAROSCOPIC UMBILICAL HERNIA;  Surgeon: Ladora Daniel, MD;  Location: [facility] ORS;  Service: General;  Laterality: N/A;  . UMBILICAL HERNIA REPAIR N/A 10/20/2016   Procedure: HERNIA REPAIR UMBILICAL ADULT;  Surgeon: Leonie Green, MD;  Location: [facility] ORS;  Service: General;  Laterality: N/A;    There were no vitals filed for this visit.  Subjective Assessment - 01/07/20 1607    Subjective   Pt. reports having had her eyes dilated today    Patient is accompanied by:  Family member    Pertinent History  Per pt. chart. Pt. is a 49 y.o. female with history of HTN, T2DM, obesity who was admitted on 07/15/19 with onset of HA progressing to flaccid left hemiparesis a few hours  later.  CT head showed acute nonhemorrhagic infarct in right basal ganglia with hyperdense right MCA sign.  CT A/P head neck showed perfusion deficit with occluded right ICA and slow flow in M1.  She underwent cerebral angiogram with revascularization of right MCA with T1C1 revascularization and repair of right ICA with flow diverter device. Stroke felt to be embolic likely due to ICA dissection. Postprocedure head CT was negative for bleed repeat CTA head neck showed reocclusion of right ICA origin and occlusion of right ICA stent but now with patent right MCA. Pt. received inpatient rehab services for 3 weeks, and home health services.    Patient Stated Goals  Patient would like to be as independent as possible, return to her prior level of function    Currently in Pain?  No/denies      OT TREATMENT   Therapeutic Exercise:  Pt. worked on left shoulder stabilization exercises in supine with the shoulder flexed to 90 degrees, and the elbow in extension. Pt. was able toinitiate active shoulder stabilization responses minimally with support provided proximallywithassist in maintaining elbow in extension. Pt. Was able to initiate circular movements. Pt. Was able to actively stabilize her shoulderAnd actively initiate repositioning of the shoulder with her left shoulder flexed to 90 degrees, and elbow extended. Pt. Requires assist with finding, and setting the LUE position.Pt. was able to perform scapular protraction with support provided at the elbow, and forearm.Pt.worked on AAROM shoulder flexion in sidelying for a gravity eliminated plane using the UE Ranger while in sidelying. Pt. worked on active digit extension sitting with the hand positioned at the tabletop.  Pt. reports having had her eyes dilated today. Pt. Was provided with sunglasses to wear while at therapy.  Pt.continues to workon shoulder stabilization exercises at home. Pt. continues to make steady progress overall. Pt.was  able to activate wrist extension, as well as 2nd, 3rd, 4th, and 5th digit extension today with her forearm,a nd hand positioned at the tabletop.Pt.is able to consistently initiate active left shoulder horizontal abduction, elbow flexion, and extension, forearm supination, wrist extension, digit extension with facilitation, and thumb abduction, as well as active thumb IP extension.Pt. continues to work on improving her ability toconsistentlyactively release objects as well as  bringing her thumb, and digits together to prepare for active grasp, and releasing objects.Pt. continues torequirework on improvingshoulder stabilization, wrist, and digitmovements for hand to face patterns, and to increase LUE engagement during ADLs, and IADL tasks.                      OT Education - 01/07/20 1748    Education Details  finger ROM, grasping patterns, reaching    Person(s) Educated  Patient    Methods  Demonstration;Explanation;Verbal  cues    Comprehension  Returned demonstration;Verbalized understanding;Verbal cues required          OT Long Term Goals - 12/10/19 1619      OT LONG TERM GOAL #1   Title  Pt. will increase left shoulder flexion PROM by 20 degrees to assist with UE dressing    Baseline  Shoulder flexion sitting 38 degrees, supine: 18(135).Pt. continues to present with limited left shoulder PROM    Time  12    Period  Weeks    Status  On-going    Target Date  02/25/20      OT LONG TERM GOAL #2   Title  Pt. will be able to actively stabilize left shoulder for 10 seconds in supine with shoulder flexed to 90 degrees, and elbow in extension in preparation for functional reaching.    Baseline  Pt. was able to initate shoulder stabilization with her shoulder flexed to 90 degrees, and her elbow extended for 5 sec. each for 3  reps. Pt. continues to activate intermittent muscle responses.    Time  12    Period  Weeks    Status  On-going    Target Date  02/25/20       OT LONG TERM GOAL #3   Title  Pt. will independently perform hand to face patterns with the LUE in preparation for light self grooming tasks.    Baseline  Pt. is able to initiate performing hand to face patterns, and is able to reach her face, however is unable to sustain, and use her hand at her face.    Time  12    Period  Weeks    Status  On-going    Target Date  02/25/20      OT LONG TERM GOAL #4   Title  Pt. will initiate 10 degrees of active left wrist extension  in preparation for actively lifting her hand off of a surface.    Baseline  Wrist extension: 5(60)    Time  12    Period  Weeks    Status  On-going    Target Date  02/25/20      OT LONG TERM GOAL #5   Title  Pt. will initiate left hand digit extension in order to be able to actively release an objects during ADL tasks.    Baseline  Pt. presents with consistent 2nd, and 5th digit extension.    Time  12    Period  Weeks    Status  On-going    Target Date  02/25/20      OT LONG TERM GOAL #6   Title  Pt. increase left hand digit flexion in order to be able to hold her toothbrush while applying toothpaste with her left right hand.    Baseline  Pt. is initiating activating gross digit flexors.    Time  12    Period  Weeks    Status  On-going    Target Date  02/25/20            Plan - 01/07/20 1609    Clinical Impression Statement  Pt. reports having had her eyes dilated today. Pt. Was provided with sunglasses to wear while at therapy.  Pt.continues to workon shoulder stabilization exercises at home. Pt. continues to make steady progress overall. Pt.was able to activate wrist extension, as well as 2nd, 3rd, 4th, and 5th digit extension today with her forearm,a nd hand positioned at the tabletop.Pt.is able to consistently  initiate active left shoulder horizontal abduction, elbow flexion, and extension, forearm supination, wrist extension, digit extension with facilitation, and thumb abduction, as well as active  thumb IP extension.Pt. continues to work on improving her ability toconsistentlyactively release objects as well as  bringing her thumb, and digits together to prepare for active grasp, and releasing objects.Pt. continues torequirework on improvingshoulder stabilization, wrist, and digitmovements for hand to face patterns, and to increase LUE engagement during ADLs, and IADL tasks.    OT Occupational Profile and History  Detailed Assessment- Review of Records and additional review of physical, cognitive, psychosocial history related to current functional performance    Occupational performance deficits (Please refer to evaluation for details):  ADL's;IADL's    Rehab Potential  Good    Clinical Decision Making  Several treatment options, min-mod task modification necessary    Comorbidities Affecting Occupational Performance:  May have comorbidities impacting occupational performance    Modification or Assistance to Complete Evaluation   Min-Moderate modification of tasks or assist with assess necessary to complete eval    OT Frequency  2x / week    OT Duration  12 weeks    OT Treatment/Interventions  Self-care/ADL training;Moist Heat;DME and/or AE instruction;Neuromuscular education;Therapeutic exercise;Therapeutic activities;Energy conservation;Patient/family education    Consulted and Agree with Plan of Care  Patient       Patient will benefit from skilled therapeutic intervention in order to improve the following deficits and impairments:           Visit Diagnosis: Muscle weakness (generalized)  Other lack of coordination    Problem List Patient Active Problem List   Diagnosis Date Noted  . E. coli UTI   . Diabetes mellitus type 2 in obese ([place])   . Chronic pain of right knee   . 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 Hemiparesis affecting left side as late effect of stroke ([place])   . Acute ischemic right middle cerebral artery (MCA) stroke ([place]) 07/22/2019  .  Carotid artery dissection  (H[facility]) s/p stent placement 07/21/2019  . Diabetes mellitus type II, uncontrolled ([place]) 07/21/2019  . Acute blood loss anemia 07/21/2019  . Leukocytosis 07/21/2019  . Stroke (cerebrum) ([place]) - R MCA infarct s/p tenecteplase and mechanical thrombectomy w/ d/t ICA dissection  07/15/2019  . Middle cerebral artery embolism, right 07/15/2019  . Controlled type 2 diabetes mellitus without complication, without long-term current use of insulin ([place]) 05/28/2017  . Attention deficit hyperactivity disorder (ADHD), combined type 04/18/2017  . Morbid obesity ([place]) 01/25/2016  . Umbilical hernia [id number]  . Hiatal hernia 12/08/2015  . History of Clostridium difficile colitis 06/19/2014  . HTN (hypertension) 12/23/2013  . PCOS (polycystic ovarian syndrome) 12/23/2013  . Bariatric surgery status 01/28/2013    Harrel Carina, MS, OTR/L 01/07/2020, 5:50 PM  [place] MAIN [facility] SERVICES [address] [place], [place], [address] Phone: [phone]   Fax:  [phone]  Name: Kathryn Coffey MRN: [id number] Date of Birth: [birth year]

## 2020-01-09 ENCOUNTER — Ambulatory Visit (HOSPITAL_COMMUNITY)
Admission: RE | Admit: 2020-01-09 | Discharge: 2020-01-09 | Disposition: A | Discharge status: Home / Self Care | Payer: No Typology Code available for payment source | Source: Ambulatory Visit | Attending: Interventional Radiology | Admitting: Interventional Radiology

## 2020-01-09 ENCOUNTER — Other Ambulatory Visit: Payer: Self-pay

## 2020-01-09 DIAGNOSIS — I639 Cerebral infarction, unspecified: Secondary | ICD-10-CM | POA: Insufficient documentation

## 2020-01-09 LAB — POCT I-STAT CREATININE: Creatinine, Ser: 0.6 mg/dL (ref 0.44–1.00)

## 2020-01-09 MED ORDER — IOHEXOL 350 MG/ML SOLN
75.0000 mL | Freq: Once | INTRAVENOUS | Status: AC | PRN
  Administered 2020-01-09: 75 mL via INTRAVENOUS

## 2020-01-10 ENCOUNTER — Other Ambulatory Visit: Payer: Self-pay

## 2020-01-12 ENCOUNTER — Encounter: Payer: Self-pay | Admitting: Occupational Therapy

## 2020-01-12 ENCOUNTER — Ambulatory Visit: Payer: No Typology Code available for payment source

## 2020-01-12 ENCOUNTER — Telehealth (HOSPITAL_COMMUNITY): Payer: Self-pay

## 2020-01-12 ENCOUNTER — Other Ambulatory Visit: Payer: Self-pay

## 2020-01-12 ENCOUNTER — Ambulatory Visit: Payer: No Typology Code available for payment source | Admitting: Occupational Therapy

## 2020-01-12 VITALS — BP 150/95 | HR 76

## 2020-01-12 DIAGNOSIS — R278 Other lack of coordination: Secondary | ICD-10-CM

## 2020-01-12 DIAGNOSIS — M6281 Muscle weakness (generalized): Secondary | ICD-10-CM

## 2020-01-12 DIAGNOSIS — R262 Difficulty in walking, not elsewhere classified: Secondary | ICD-10-CM

## 2020-01-12 DIAGNOSIS — I63511 Cerebral infarction due to unspecified occlusion or stenosis of right middle cerebral artery: Secondary | ICD-10-CM

## 2020-01-12 MED ORDER — TOPIRAMATE 25 MG PO TABS: 25.0000 mg | ORAL_TABLET | Freq: Every day | ORAL | 0 refills | Status: DC

## 2020-01-12 NOTE — Telephone Encounter (Signed)
Called pt regarding recent cta, no answer, left vm. AW 

## 2020-01-12 NOTE — Telephone Encounter (Signed)
Pt agreed to f/u in 6 months with cta head/neck. AW 

## 2020-01-12 NOTE — Therapy (Signed)
[place] MAIN [facility] SERVICES [address] [place], [place], [address] Phone: [phone]   Fax:  [phone]  Occupational Therapy Treatment  Patient Details  Name: Kathryn Coffey MRN: [id number] Date of Birth: 1971/01/27 Referring Provider (OT): Raulkar   Encounter Date: 01/12/2020  OT End of Session - 01/13/20 1651    Number of Visits  6    Date for OT Re-Evaluation  02/25/20    Authorization Type  Progress report period starting  on 12/10/2019    Authorization Time Period  FOTO    OT Start Time  1600    OT Stop Time  1645    OT Time Calculation (min)  45 min    Activity Tolerance  Patient tolerated treatment well    Behavior During Therapy  [facility] for tasks assessed/performed       Past Medical History:  Diagnosis Date  . Abdominal wall cellulitis 2013 and 2014  . Asthma    allergic to grasses  . B12 deficiency   . Complication of anesthesia   . Costochondritis   . Diabetes mellitus without complication ([place])   . Gastric anomaly    multiple small ulcers  . GERD (gastroesophageal reflux disease)   . Herpes 06/2018   POSSIBLY IN EYE- PT TAKING VALTREX AND WILL SEE OPTHAMOLOGIST TO SEE IF VALTREX IS WORKING  . History of abnormal cervical Pap smear   . History of Clostridium difficile colitis   . History of hiatal hernia   . History of kidney stones   . Hypertension   . IBS (irritable bowel syndrome)   . Migraine   . Morbid obesity ([place])    s/p attempted gastric banding now decompressed  . PCOS (polycystic ovarian syndrome)   . PONV (postoperative nausea and vomiting)    WITH SPINAL ONLY  . Stroke [facility])     Past Surgical History:  Procedure Laterality Date  . BREAST EXCISIONAL BIOPSY Left    cyst excision - Dr. Patty Sermons  . CESAREAN SECTION    . CHOLECYSTECTOMY    . COLONOSCOPY    . EAR CYST EXCISION Right 07/04/2018   Procedure: EXCISION GLOMUS TUMOR THUMBNAIL;  Surgeon: Corky Mull, MD;  Location: [facility] ORS;   Service: Orthopedics;  Laterality: Right;  . ESOPHAGOGASTRODUODENOSCOPY    . ESOPHAGOGASTRODUODENOSCOPY (EGD) WITH PROPOFOL N/A 12/06/2015   Procedure: ESOPHAGOGASTRODUODENOSCOPY (EGD) WITH PROPOFOL;  Surgeon: Manya Silvas, MD;  Location: [facility] ENDOSCOPY;  Service: Endoscopy;  Laterality: N/A;  . IR ANGIO INTRA EXTRACRAN SEL INTERNAL CAROTID UNI L MOD SED  07/15/2019  . IR ANGIO VERTEBRAL SEL SUBCLAVIAN INNOMINATE UNI R MOD SED  07/15/2019  . IR CT HEAD LTD  07/15/2019  . IR INTRAVSC STENT CERV CAROTID W/O EMB-PROT MOD SED INC ANGIO  07/15/2019  . IR PERCUTANEOUS ART THROMBECTOMY/INFUSION INTRACRANIAL INC DIAG ANGIO  07/15/2019  . LAPAROSCOPIC GASTRIC BANDING    . LAPAROSCOPIC GASTRIC RESTRICTIVE DUODENAL PROCEDURE (DUODENAL SWITCH) N/A 01/25/2016   Procedure: LAPAROSCOPIC GASTRIC RESTRICTIVE DUODENAL PROCEDURE (DUODENAL SWITCH);  Surgeon: Ladora Daniel, MD;  Location: [facility] ORS;  Service: General;  Laterality: N/A;  . OVARY SURGERY     x2  . RADIOLOGY WITH ANESTHESIA N/A 07/15/2019   Procedure: IR WITH ANESTHESIA;  Surgeon: Luanne Bras, MD;  Location: [place];  Service: Radiology;  Laterality: N/A;  . TONSILLECTOMY    . TUBAL LIGATION    . UMBILICAL HERNIA REPAIR N/A 01/25/2016   Procedure: LAPAROSCOPIC UMBILICAL HERNIA;  Surgeon: Venia Carbon  Duke Salvia, MD;  Location: [facility] ORS;  Service: General;  Laterality: N/A;  . UMBILICAL HERNIA REPAIR N/A 10/20/2016   Procedure: HERNIA REPAIR UMBILICAL ADULT;  Surgeon: Leonie Green, MD;  Location: [facility] ORS;  Service: General;  Laterality: N/A;    There were no vitals filed for this visit.  Subjective Assessment - 01/13/20 1651    Subjective   Patient reports she is doing well, had a good weekend and felt she had some improvements in motion with her exercises.    Pertinent History  Per pt. chart. Pt. is a 49 y.o. female with history of HTN, T2DM, obesity who was admitted on 07/15/19 with onset of HA progressing to flaccid left hemiparesis a few  hours later.  CT head showed acute nonhemorrhagic infarct in right basal ganglia with hyperdense right MCA sign.  CT A/P head neck showed perfusion deficit with occluded right ICA and slow flow in M1.  She underwent cerebral angiogram with revascularization of right MCA with T1C1 revascularization and repair of right ICA with flow diverter device. Stroke felt to be embolic likely due to ICA dissection. Postprocedure head CT was negative for bleed repeat CTA head neck showed reocclusion of right ICA origin and occlusion of right ICA stent but now with patent right MCA. Pt. received inpatient rehab services for 3 weeks, and home health services.    Patient Stated Goals  Patient would like to be as independent as possible, return to her prior level of function    Currently in Pain?  No/denies    Pain Score  0-No pain       Therapeutic Exercise:  Pt. Seen for  left shoulder stabilization exercises in supine with the shoulder flexed to 90 degrees, and the elbow in extension. Patient performing place and hold of left shoulder, mild external perturbations added.  Small circular movement patterns with assist from therapist to maintain shoulder flexion at 90 degrees.  Elbow extension with cues while maintaining shoulder at 90 degrees of flexion with some proximal stability from therapist.  Shoulder protraction for 10 reps.   In sitting, patient seen for left finger extension, finger ABD/ADD, thumb flexion, extension, radial and palmar abd with therapist assist and guiding as needed. Patient performing wrist flexion/extension with cues, supination/pronation.  Patient working on grasp and release of objects, variety of shapes and sizes.   Response to tx:   Patient continues to make progress with facilitation of active movement patterns on the left as well as greater ROM and strengthening to move towards functional use of left UE for daily tasks.  She has been using gross grasping patterns to stabilize and hold  objects as much as possible.  Patient reports feeling good about her movements over the weekend and is pleased with seeing additional progress.  Continue to work towards goals in plan of care to maximize safety and independence in necessary daily tasks.                     OT Education - 01/13/20 1651    Education Details  shoulder stabilization exercises, finger extension    Person(s) Educated  Patient    Methods  Demonstration;Explanation;Verbal cues    Comprehension  Returned demonstration;Verbalized understanding;Verbal cues required          OT Long Term Goals - 12/10/19 1619      OT LONG TERM GOAL #1   Title  Pt. will increase left shoulder flexion PROM by 20 degrees to assist with UE  dressing    Baseline  Shoulder flexion sitting 38 degrees, supine: 18(135).Pt. continues to present with limited left shoulder PROM    Time  12    Period  Weeks    Status  On-going    Target Date  02/25/20      OT LONG TERM GOAL #2   Title  Pt. will be able to actively stabilize left shoulder for 10 seconds in supine with shoulder flexed to 90 degrees, and elbow in extension in preparation for functional reaching.    Baseline  Pt. was able to initate shoulder stabilization with her shoulder flexed to 90 degrees, and her elbow extended for 5 sec. each for 3  reps. Pt. continues to activate intermittent muscle responses.    Time  12    Period  Weeks    Status  On-going    Target Date  02/25/20      OT LONG TERM GOAL #3   Title  Pt. will independently perform hand to face patterns with the LUE in preparation for light self grooming tasks.    Baseline  Pt. is able to initiate performing hand to face patterns, and is able to reach her face, however is unable to sustain, and use her hand at her face.    Time  12    Period  Weeks    Status  On-going    Target Date  02/25/20      OT LONG TERM GOAL #4   Title  Pt. will initiate 10 degrees of active left wrist extension  in  preparation for actively lifting her hand off of a surface.    Baseline  Wrist extension: 5(60)    Time  12    Period  Weeks    Status  On-going    Target Date  02/25/20      OT LONG TERM GOAL #5   Title  Pt. will initiate left hand digit extension in order to be able to actively release an objects during ADL tasks.    Baseline  Pt. presents with consistent 2nd, and 5th digit extension.    Time  12    Period  Weeks    Status  On-going    Target Date  02/25/20      OT LONG TERM GOAL #6   Title  Pt. increase left hand digit flexion in order to be able to hold her toothbrush while applying toothpaste with her left right hand.    Baseline  Pt. is initiating activating gross digit flexors.    Time  12    Period  Weeks    Status  On-going    Target Date  02/25/20            Plan - 01/13/20 1651    OT Occupational Profile and History  Detailed Assessment- Review of Records and additional review of physical, cognitive, psychosocial history related to current functional performance    Occupational performance deficits (Please refer to evaluation for details):  ADL's;IADL's    Rehab Potential  Good    Clinical Decision Making  Several treatment options, min-mod task modification necessary    Comorbidities Affecting Occupational Performance:  May have comorbidities impacting occupational performance    Modification or Assistance to Complete Evaluation   Min-Moderate modification of tasks or assist with assess necessary to complete eval    OT Frequency  2x / week    OT Duration  12 weeks    OT Treatment/Interventions  Self-care/ADL training;Moist Heat;DME  and/or AE instruction;Neuromuscular education;Therapeutic exercise;Therapeutic activities;Energy conservation;Patient/family education    Consulted and Agree with Plan of Care  Patient       Patient will benefit from skilled therapeutic intervention in order to improve the following deficits and impairments:           Visit  Diagnosis: Muscle weakness (generalized)  Other lack of coordination  Acute ischemic right middle cerebral artery (MCA) stroke [facility])    Problem List Patient Active Problem List   Diagnosis Date Noted  . E. coli UTI   . Diabetes mellitus type 2 in obese ([place])   . Chronic pain of right knee   . 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 Hemiparesis affecting left side as late effect of stroke ([place])   . Acute ischemic right middle cerebral artery (MCA) stroke ([place]) 07/22/2019  . Carotid artery dissection  (HCC) s/p stent placement 07/21/2019  . Diabetes mellitus type II, uncontrolled ([place]) 07/21/2019  . Acute blood loss anemia 07/21/2019  . Leukocytosis 07/21/2019  . Stroke (cerebrum) ([place]) - R MCA infarct s/p tenecteplase and mechanical thrombectomy w/ d/t ICA dissection  07/15/2019  . Middle cerebral artery embolism, right 07/15/2019  . Controlled type 2 diabetes mellitus without complication, without long-term current use of insulin ([place]) 05/28/2017  . Attention deficit hyperactivity disorder (ADHD), combined type 04/18/2017  . Morbid obesity ([place]) 01/25/2016  . Umbilical hernia [id number]  . Hiatal hernia 12/08/2015  . History of Clostridium difficile colitis 06/19/2014  . HTN (hypertension) 12/23/2013  . PCOS (polycystic ovarian syndrome) 12/23/2013  . Bariatric surgery status 01/28/2013   Achilles Dunk, OTR/L, CLT  Ripley Lovecchio 01/14/2020, 4:59 PM  [place] MAIN [facility] SERVICES [address] [place], [place], [address] Phone: [phone]   Fax:  [phone]  Name: JENEA KOHORST MRN: [id number] Date of Birth: 1970-11-11

## 2020-01-12 NOTE — Therapy (Signed)
[place] MAIN [facility] SERVICES [address] [place], [place], [address] Phone: [phone]   Fax:  [phone]  Physical Therapy Treatment  Patient Details  Name: Kathryn Coffey MRN: [id number] Date of Birth: 1971/01/20 No data recorded  Encounter Date: 01/12/2020  PT End of Session - 01/12/20 1729    Visit Number  19    Number of Visits  49    Date for PT Re-Evaluation  01/26/20    PT Start Time  1520    PT Stop Time  1602    PT Time Calculation (min)  42 min    Equipment Utilized During Treatment  Gait belt    Activity Tolerance  Patient tolerated treatment well    Behavior During Therapy  WFL for tasks assessed/performed       Past Medical History:  Diagnosis Date  . Abdominal wall cellulitis 2013 and 2014  . Asthma    allergic to grasses  . B12 deficiency   . Complication of anesthesia   . Costochondritis   . Diabetes mellitus without complication ([place])   . Gastric anomaly    multiple small ulcers  . GERD (gastroesophageal reflux disease)   . Herpes 06/2018   POSSIBLY IN EYE- PT TAKING VALTREX AND WILL SEE OPTHAMOLOGIST TO SEE IF VALTREX IS WORKING  . History of abnormal cervical Pap smear   . History of Clostridium difficile colitis   . History of hiatal hernia   . History of kidney stones   . Hypertension   . IBS (irritable bowel syndrome)   . Migraine   . Morbid obesity ([place])    s/p attempted gastric banding now decompressed  . PCOS (polycystic ovarian syndrome)   . PONV (postoperative nausea and vomiting)    WITH SPINAL ONLY  . Stroke [facility])     Past Surgical History:  Procedure Laterality Date  . BREAST EXCISIONAL BIOPSY Left    cyst excision - Dr. Patty Sermons  . CESAREAN SECTION    . CHOLECYSTECTOMY    . COLONOSCOPY    . EAR CYST EXCISION Right 07/04/2018   Procedure: EXCISION GLOMUS TUMOR THUMBNAIL;  Surgeon: Corky Mull, MD;  Location: [facility] ORS;  Service: Orthopedics;  Laterality: Right;  .  ESOPHAGOGASTRODUODENOSCOPY    . ESOPHAGOGASTRODUODENOSCOPY (EGD) WITH PROPOFOL N/A 12/06/2015   Procedure: ESOPHAGOGASTRODUODENOSCOPY (EGD) WITH PROPOFOL;  Surgeon: Manya Silvas, MD;  Location: [facility] ENDOSCOPY;  Service: Endoscopy;  Laterality: N/A;  . IR ANGIO INTRA EXTRACRAN SEL INTERNAL CAROTID UNI L MOD SED  07/15/2019  . IR ANGIO VERTEBRAL SEL SUBCLAVIAN INNOMINATE UNI R MOD SED  07/15/2019  . IR CT HEAD LTD  07/15/2019  . IR INTRAVSC STENT CERV CAROTID W/O EMB-PROT MOD SED INC ANGIO  07/15/2019  . IR PERCUTANEOUS ART THROMBECTOMY/INFUSION INTRACRANIAL INC DIAG ANGIO  07/15/2019  . LAPAROSCOPIC GASTRIC BANDING    . LAPAROSCOPIC GASTRIC RESTRICTIVE DUODENAL PROCEDURE (DUODENAL SWITCH) N/A 01/25/2016   Procedure: LAPAROSCOPIC GASTRIC RESTRICTIVE DUODENAL PROCEDURE (DUODENAL SWITCH);  Surgeon: Ladora Daniel, MD;  Location: [facility] ORS;  Service: General;  Laterality: N/A;  . OVARY SURGERY     x2  . RADIOLOGY WITH ANESTHESIA N/A 07/15/2019   Procedure: IR WITH ANESTHESIA;  Surgeon: Luanne Bras, MD;  Location: [place];  Service: Radiology;  Laterality: N/A;  . TONSILLECTOMY    . TUBAL LIGATION    . UMBILICAL HERNIA REPAIR N/A 01/25/2016   Procedure: LAPAROSCOPIC UMBILICAL HERNIA;  Surgeon: Ladora Daniel, MD;  Location: [facility] ORS;  Service: General;  Laterality: N/A;  . UMBILICAL HERNIA REPAIR N/A 10/20/2016   Procedure: HERNIA REPAIR UMBILICAL ADULT;  Surgeon: Leonie Green, MD;  Location: [facility] ORS;  Service: General;  Laterality: N/A;    Vitals:   01/12/20 1522  BP: (!) 150/95  Pulse: 76  SpO2: 100%    Subjective Assessment - 01/12/20 1728    Subjective  Patient reports she is doing well today. No resting pain reported upon arrival. She is wearing her knee brace on the L side which continues with her hyperextension. Compliant with HEP. No specific questions or concerns currently.    Pertinent History  Patient was in the hospital [place] and then in patient rehab 3 weeks.  She was dicharged 08/13/19 and then HHPT and was discharged 09/03/19. She has not had any falls.    Currently in Pain?  No/denies        TREATMENT   Ther-ex NuStep L2 x 5 minutes for warm-up during history (4 minutes unbilled); Gait in hallway without knee cage to assess L knee hyperextension; Precor single leg press LLE: 45# x 20, 55# x 9 (muscle failure obtained during second set); Standing L TKE with green tband x 20; Pball wall squats 15s on/off x 5; Lunge balance with LLE forward on dynadisc 15s on/off x 3; Forward lunges leading with LLE onto BOSU (round side up) x 10; Static BOSU balance (round side up) x 30s; BOSU (round side up) squats x 10 with intermittent 2 finger balance on // bars; Seated L HS curls with green tband 2 x 20; Seated L LAQ with manual resistance 2 x 20; Seated L DF with 3# ankle weight around forefoot and leg on step 2 x 20;   Pt educated throughout session about proper posture and technique with exercises. Improved exercise technique, movement at target joints, use of target muscles after min to mod verbal, visual, tactile cues.   Pt demonstrates excellent motivation during sessiontoday. Gait attempting in hallway without knee cage however she continues to demonstrate significant hyperextension. Continued with terminal extension quad strength strengthening as well as quad endurance. Pt encouraged to continue HEP and follow-up as scheduled.  She will benefit from PT services to address deficits in strength, balance, and mobility in order to return to full function at home.                          PT Short Term Goals - 11/03/19 1356      PT SHORT TERM GOAL #1   Title  Patient will be independent in home exercise program to improve strength/mobility for better functional independence with ADLs.    Baseline  11/03/19: Pt performing HEP at least 5d/wk    Time  6    Period  Weeks    Status  Partially Met    Target Date   12/15/19      PT SHORT TERM GOAL #2   Title  Patient (> 83 years old) will complete five times sit to stand test in < 15 seconds indicating an increased LE strength and improved balance.    Baseline  eval: 15.28s; 11/03/19: 12.2s    Time  6    Period  Weeks    Status  Achieved    Target Date  10/20/19        PT Long Term Goals - 11/03/19 1359      PT LONG TERM GOAL #1   Title  Patient will  increase Berg Balance score by > 6 points to demonstrate decreased fall risk during functional activities.    Baseline  09/08/19: 48/56; 11/03/19: 51/56    Time  12    Period  Weeks    Status  Partially Met    Target Date  01/26/20      PT LONG TERM GOAL #2   Title  Patient will increase six minute walk test distance to >1200 for progression to community ambulator and improve gait ability    Baseline  09/08/19: 525', 11/03/19: 817';    Time  12    Period  Weeks    Status  Partially Met    Target Date  01/26/20      PT LONG TERM GOAL #3   Title  Patient will increase 10 meter walk test to >1.78ms as to improve gait speed for better community ambulation and to reduce fall risk    Baseline  09/08/19:  0.51 m/s; 11/03/19: self-selected: 13.2s = 0.76 m/s, fastest: 12.5s = 0.8 m/s    Time  12    Period  Weeks    Status  Partially Met    Target Date  01/26/20            Plan - 01/12/20 1732    Clinical Impression Statement  Pt demonstrates excellent motivation during session today. Gait attempting in hallway without knee cage however she continues to demonstrate significant hyperextension. Continued with terminal extension quad strength strengthening as well as quad endurance. Pt encouraged to continue HEP and follow-up as scheduled.  She will benefit from PT services to address deficits in strength, balance, and mobility in order to return to full function at home.    Personal Factors and Comorbidities  Comorbidity 1    Comorbidities  diabetes, high blood pressure, reflux,     Examination-Activity Limitations  Carry;Caring for Others;Bend;Locomotion Level;Self Feeding;Sleep;Toileting    Examination-Participation Restrictions  Cleaning;Community Activity;Driving;Laundry;Shop    Stability/Clinical Decision Making  Stable/Uncomplicated    Rehab Potential  Good    PT Frequency  2x / week    PT Duration  12 weeks    PT Treatment/Interventions  Manual techniques;Functional mobility training;Stair training;Gait training;Electrical Stimulation;Moist Heat;Ultrasound;Therapeutic exercise;Balance training;Neuromuscular re-education    PT Next Visit Plan  strengthening and balance    PT Home Exercise Plan  standing hip abd,    Consulted and Agree with Plan of Care  Patient       Patient will benefit from skilled therapeutic intervention in order to improve the following deficits and impairments:  Abnormal gait, Decreased knowledge of use of DME, Dizziness, Pain, Decreased coordination, Impaired UE functional use, Decreased strength, Decreased endurance, Decreased activity tolerance, Decreased balance, Difficulty walking, Obesity  Visit Diagnosis: Muscle weakness (generalized)  Difficulty in walking, not elsewhere classified     Problem List Patient Active Problem List   Diagnosis Date Noted  . E. coli UTI   . Diabetes mellitus type 2 in obese ([place]   . Chronic pain of right knee   . Flaccid monoplegia of upper extremity ([place]   . Hemiparesis affecting left side as late effect of stroke ([place]   . Acute ischemic right middle cerebral artery (MCA) stroke ([place] 07/22/2019  . Carotid artery dissection  (HCC) s/p stent placement 07/21/2019  . Diabetes mellitus type II, uncontrolled ([facility]. [place] 07/21/2019  . Acute blood loss anemia 07/21/2019  . Leukocytosis 07/21/2019  . Stroke (cerebrum) ([place] - R MCA infarct s/p tenecteplase and mechanical thrombectomy w/ d/t ICA dissection  07/15/2019  . Middle cerebral artery embolism, right 07/15/2019  . Controlled type 2 diabetes  mellitus without complication, without long-term current use of insulin ([place]) 05/28/2017  . Attention deficit hyperactivity disorder (ADHD), combined type 04/18/2017  . Morbid obesity ([place]) 01/25/2016  . Umbilical hernia 47/15/9539  . Hiatal hernia 12/08/2015  . History of Clostridium difficile colitis 06/19/2014  . HTN (hypertension) 12/23/2013  . PCOS (polycystic ovarian syndrome) 12/23/2013  . Bariatric surgery status 01/28/2013   Phillips Grout PT, DPT, GCS  Aydenn Gervin 01/12/2020, 5:35 PM  [place] MAIN [facility] SERVICES [address] [place], [place], [address] Phone: [phone]   Fax:  [phone]  Name: Kathryn Coffey MRN: [id number] Date of Birth: June 09, 1971

## 2020-01-14 ENCOUNTER — Other Ambulatory Visit: Payer: Self-pay

## 2020-01-14 ENCOUNTER — Encounter: Payer: Self-pay | Admitting: Occupational Therapy

## 2020-01-14 ENCOUNTER — Ambulatory Visit: Payer: No Typology Code available for payment source | Admitting: Occupational Therapy

## 2020-01-14 ENCOUNTER — Ambulatory Visit: Payer: No Typology Code available for payment source

## 2020-01-14 DIAGNOSIS — M6281 Muscle weakness (generalized): Secondary | ICD-10-CM

## 2020-01-14 DIAGNOSIS — R278 Other lack of coordination: Secondary | ICD-10-CM

## 2020-01-14 NOTE — Therapy (Signed)
[place] [facility] SERVICES [address] [place], [place], [address] Phone: [phone]   Fax:  [phone]  Occupational Therapy Treatment  Patient Details  Name: Kathryn Coffey MRN: [id number] Date of Birth: [birth year] Referring Provider (OT): Raulkar   Encounter Date: 01/14/2020  OT End of Session - 01/14/20 1745    Visit Number  27    Number of Visits  98    Date for OT Re-Evaluation  02/25/20    Authorization Type  Progress report period starting  on 12/10/2019    Authorization Time Period  FOTO    OT Start Time  1603    OT Stop Time  1645    OT Time Calculation (min)  42 min    Activity Tolerance  Patient tolerated treatment well    Behavior During Therapy  [facility] for tasks assessed/performed       Past Medical History:  Diagnosis Date  . Abdominal wall cellulitis 2013 and 2014  . Asthma    allergic to grasses  . B12 deficiency   . Complication of anesthesia   . Costochondritis   . Diabetes mellitus without complication ([place])   . Gastric anomaly    multiple small ulcers  . GERD (gastroesophageal reflux disease)   . Herpes 06/2018   POSSIBLY IN EYE- PT TAKING VALTREX AND WILL SEE OPTHAMOLOGIST TO SEE IF VALTREX IS WORKING  . History of abnormal cervical Pap smear   . History of Clostridium difficile colitis   . History of hiatal hernia   . History of kidney stones   . Hypertension   . IBS (irritable bowel syndrome)   . Migraine   . Morbid obesity ([place])    s/p attempted gastric banding now decompressed  . PCOS (polycystic ovarian syndrome)   . PONV (postoperative nausea and vomiting)    WITH SPINAL ONLY  . Stroke [facility])     Past Surgical History:  Procedure Laterality Date  . BREAST EXCISIONAL BIOPSY Left    cyst excision - Dr. Patty Sermons  . CESAREAN SECTION    . CHOLECYSTECTOMY    . COLONOSCOPY    . EAR CYST EXCISION Right 07/04/2018   Procedure: EXCISION GLOMUS TUMOR THUMBNAIL;  Surgeon: Corky Mull, MD;   Location: [facility] ORS;  Service: Orthopedics;  Laterality: Right;  . ESOPHAGOGASTRODUODENOSCOPY    . ESOPHAGOGASTRODUODENOSCOPY (EGD) WITH PROPOFOL N/A 12/06/2015   Procedure: ESOPHAGOGASTRODUODENOSCOPY (EGD) WITH PROPOFOL;  Surgeon: Manya Silvas, MD;  Location: [facility] ENDOSCOPY;  Service: Endoscopy;  Laterality: N/A;  . IR ANGIO INTRA EXTRACRAN SEL INTERNAL CAROTID UNI L MOD SED  07/15/2019  . IR ANGIO VERTEBRAL SEL SUBCLAVIAN INNOMINATE UNI R MOD SED  07/15/2019  . IR CT HEAD LTD  07/15/2019  . IR INTRAVSC STENT CERV CAROTID W/O EMB-PROT MOD SED INC ANGIO  07/15/2019  . IR PERCUTANEOUS ART THROMBECTOMY/INFUSION INTRACRANIAL INC DIAG ANGIO  07/15/2019  . LAPAROSCOPIC GASTRIC BANDING    . LAPAROSCOPIC GASTRIC RESTRICTIVE DUODENAL PROCEDURE (DUODENAL SWITCH) N/A 01/25/2016   Procedure: LAPAROSCOPIC GASTRIC RESTRICTIVE DUODENAL PROCEDURE (DUODENAL SWITCH);  Surgeon: Ladora Daniel, MD;  Location: [facility] ORS;  Service: General;  Laterality: N/A;  . OVARY SURGERY     x2  . RADIOLOGY WITH ANESTHESIA N/A 07/15/2019   Procedure: IR WITH ANESTHESIA;  Surgeon: Luanne Bras, MD;  Location: [place];  Service: Radiology;  Laterality: N/A;  . TONSILLECTOMY    . TUBAL LIGATION    . UMBILICAL HERNIA REPAIR N/A 01/25/2016   Procedure:  LAPAROSCOPIC UMBILICAL HERNIA;  Surgeon: Ladora Daniel, MD;  Location: [facility] ORS;  Service: General;  Laterality: N/A;  . UMBILICAL HERNIA REPAIR N/A 10/20/2016   Procedure: HERNIA REPAIR UMBILICAL ADULT;  Surgeon: Leonie Green, MD;  Location: [facility] ORS;  Service: General;  Laterality: N/A;    There were no vitals filed for this visit.  Subjective Assessment - 01/14/20 1743    Subjective   Pt. reports that she plans to spend the [facility] at the pool.    Patient is accompanied by:  Family member    Pertinent History  Per pt. chart. Pt. is a 49 y.o. female with history of HTN, T2DM, obesity who was admitted on 07/15/19 with onset of HA progressing to flaccid  left hemiparesis a few hours later.  CT head showed acute nonhemorrhagic infarct in right basal ganglia with hyperdense right MCA sign.  CT A/P head neck showed perfusion deficit with occluded right ICA and slow flow in M1.  She underwent cerebral angiogram with revascularization of right MCA with T1C1 revascularization and repair of right ICA with flow diverter device. Stroke felt to be embolic likely due to ICA dissection. Postprocedure head CT was negative for bleed repeat CTA head neck showed reocclusion of right ICA origin and occlusion of right ICA stent but now with patent right MCA. Pt. received inpatient rehab services for 3 weeks, and home health services.    Patient Stated Goals  Patient would like to be as independent as possible, return to her prior level of function    Currently in Pain?  No/denies      OT TREATMENT   Neuro muscular re-education:  Pt. Worked on using her left hand at the tabletop to grasp 1/2" flat square letters. The task was modified to 1.25" connect 4 circular discs. Pt. Focused on grasping them, and stacking them, followed by gross digit extension between each set.  Therapeutic Exercise:  Pt. worked on left shoulder stabilization exercises in supine with the shoulder flexed to 90 degrees, and the elbow in extension. Pt. was able toinitiate active shoulder stabilization responses consistently with less support proximally at the  elbow in extension. Pt. Performed circular movements. Pt. Was able to actively stabilize her shoulderAnd actively initiate repositioning of the shoulder with her left shoulder flexed to 90 degrees, and elbow extended. Pt. Was able to maintain the position. Pt. was able to perform scapular protraction with support provided at the elbow, and forearm.Pt.worked on AAROM shoulder flexion in sidelying for a gravity eliminated plane using the UE Ranger while in sidelying. Pt. worked on active digit extension sitting with the hand positioned at  the tabletop.   Pt. Is making excellent progress. Pt. reports that she is now able to hold smaller makeup containers with her left hand.  Pt. Has improved digit extension, and is able to press keys with her 2nd digit. Pt. Reports that she is now able to consistently place her LUE onto a tabletop from a sitting position. Pt.continues to workon shoulder stabilization exercises at home.Pt. continues to make steady progress overall.Pt.was able to activate wrist extension, as well as2nd, 3rd, 4th, and 5thdigit extension today with her forearm,a nd hand positioned at the tabletop.Pt.is able to consistently initiate active left shoulder horizontal abduction, elbow flexion, and extension, forearm supination, wrist extension, digit extension with facilitation, and thumb abduction, as well as active thumb IP extension.Pt.continues to work on improving her ability toactively,a nd functionally grasp, and release objects.Pt. continues torequirework on improvingshoulder stabilization,  wrist, and digitmovements for hand to face patterns, and to increase LUE engagement during ADLs, and IADL tasks.                          OT Education - 01/14/20 1745    Education Details  shoulder stabilization exercises, finger extension    Person(s) Educated  Patient    Methods  Demonstration;Explanation;Verbal cues    Comprehension  Returned demonstration;Verbalized understanding;Verbal cues required          OT Long Term Goals - 12/10/19 1619      OT LONG TERM GOAL #1   Title  Pt. will increase left shoulder flexion PROM by 20 degrees to assist with UE dressing    Baseline  Shoulder flexion sitting 38 degrees, supine: 18(135).Pt. continues to present with limited left shoulder PROM    Time  12    Period  Weeks    Status  On-going    Target Date  02/25/20      OT LONG TERM GOAL #2   Title  Pt. will be able to actively stabilize left shoulder for 10 seconds in supine with  shoulder flexed to 90 degrees, and elbow in extension in preparation for functional reaching.    Baseline  Pt. was able to initate shoulder stabilization with her shoulder flexed to 90 degrees, and her elbow extended for 5 sec. each for 3  reps. Pt. continues to activate intermittent muscle responses.    Time  12    Period  Weeks    Status  On-going    Target Date  02/25/20      OT LONG TERM GOAL #3   Title  Pt. will independently perform hand to face patterns with the LUE in preparation for light self grooming tasks.    Baseline  Pt. is able to initiate performing hand to face patterns, and is able to reach her face, however is unable to sustain, and use her hand at her face.    Time  12    Period  Weeks    Status  On-going    Target Date  02/25/20      OT LONG TERM GOAL #4   Title  Pt. will initiate 10 degrees of active left wrist extension  in preparation for actively lifting her hand off of a surface.    Baseline  Wrist extension: 5(60)    Time  12    Period  Weeks    Status  On-going    Target Date  02/25/20      OT LONG TERM GOAL #5   Title  Pt. will initiate left hand digit extension in order to be able to actively release an objects during ADL tasks.    Baseline  Pt. presents with consistent 2nd, and 5th digit extension.    Time  12    Period  Weeks    Status  On-going    Target Date  02/25/20      OT LONG TERM GOAL #6   Title  Pt. increase left hand digit flexion in order to be able to hold her toothbrush while applying toothpaste with her left right hand.    Baseline  Pt. is initiating activating gross digit flexors.    Time  12    Period  Weeks    Status  On-going    Target Date  02/25/20            Plan -  01/14/20 1745    Clinical Impression Statement Pt. Is making excellent progress. Pt. reports that she is now able to hold smaller makeup containers with her left hand.  Pt. Has improved digit extension, and is able to press keys with her 2nd digit. Pt.  Reports that she is now able to consistently place her LUE onto a tabletop from a sitting position. Pt.continues to workon shoulder stabilization exercises at home.Pt. continues to make steady progress overall.Pt.was able to activate wrist extension, as well as2nd, 3rd, 4th, and 5thdigit extension today with her forearm,a nd hand positioned at the tabletop.Pt.is able to consistently initiate active left shoulder horizontal abduction, elbow flexion, and extension, forearm supination, wrist extension, digit extension with facilitation, and thumb abduction, as well as active thumb IP extension.Pt.continues to work on improving her ability toactively,a nd functionally grasp, and release objects.Pt. continues torequirework on improvingshoulder stabilization, wrist, and digitmovements for hand to face patterns, and to increase LUE engagement during ADLs, and IADL tasks.   OT Occupational Profile and History  Detailed Assessment- Review of Records and additional review of physical, cognitive, psychosocial history related to current functional performance    Occupational performance deficits (Please refer to evaluation for details):  ADL's;IADL's    Rehab Potential  Good    Clinical Decision Making  Several treatment options, min-mod task modification necessary    Comorbidities Affecting Occupational Performance:  May have comorbidities impacting occupational performance    Modification or Assistance to Complete Evaluation   Min-Moderate modification of tasks or assist with assess necessary to complete eval    OT Frequency  2x / week    OT Duration  12 weeks    OT Treatment/Interventions  Self-care/ADL training;Moist Heat;DME and/or AE instruction;Neuromuscular education;Therapeutic exercise;Therapeutic activities;Energy conservation;Patient/family education    Consulted and Agree with Plan of Care  Patient       Patient will benefit from skilled therapeutic intervention in order to improve  the following deficits and impairments:           Visit Diagnosis: Muscle weakness (generalized)  Other lack of coordination    Problem List Patient Active Problem List   Diagnosis Date Noted  . E. coli UTI   . Diabetes mellitus type 2 in obese ([place])   . Chronic pain of right knee   . Flaccid monoplegia of upper extremity ([place])   . Hemiparesis affecting left side as late effect of stroke ([place])   . Acute ischemic right middle cerebral artery (MCA) stroke ([place]) 07/22/2019  . Carotid artery dissection  ([facility]) s/p stent placement 07/21/2019  . Diabetes mellitus type II, uncontrolled ([place]) 07/21/2019  . Acute blood loss anemia 07/21/2019  . Leukocytosis 07/21/2019  . Stroke (cerebrum) ([place]) - R MCA infarct s/p tenecteplase and mechanical thrombectomy w/ d/t ICA dissection  07/15/2019  . Middle cerebral artery embolism, right 07/15/2019  . Controlled type 2 diabetes mellitus without complication, without long-term current use of insulin ([place]) 05/28/2017  . Attention deficit hyperactivity disorder (ADHD), combined type 04/18/2017  . Morbid obesity ([place]) 01/25/2016  . Umbilical hernia [id number]  . Hiatal hernia 12/08/2015  . History of Clostridium difficile colitis 06/19/2014  . HTN (hypertension) 12/23/2013  . PCOS (polycystic ovarian syndrome) 12/23/2013  . Bariatric surgery status 01/28/2013    Harrel Carina, MS, OTR/L 01/14/2020, 5:47 PM  [place] [facility] SERVICES [address] [place], [place], [address] Phone: [phone]   Fax:  [phone]  Name: Kathryn Coffey MRN: [id number] Date of Birth:  12/06/1970 

## 2020-01-20 ENCOUNTER — Ambulatory Visit: Payer: No Typology Code available for payment source | Admitting: Occupational Therapy

## 2020-01-20 ENCOUNTER — Ambulatory Visit: Payer: No Typology Code available for payment source | Attending: Physical Medicine and Rehabilitation

## 2020-01-20 ENCOUNTER — Other Ambulatory Visit: Payer: Self-pay

## 2020-01-20 DIAGNOSIS — R278 Other lack of coordination: Secondary | ICD-10-CM | POA: Insufficient documentation

## 2020-01-20 DIAGNOSIS — M6281 Muscle weakness (generalized): Secondary | ICD-10-CM | POA: Insufficient documentation

## 2020-01-20 DIAGNOSIS — I63511 Cerebral infarction due to unspecified occlusion or stenosis of right middle cerebral artery: Secondary | ICD-10-CM | POA: Diagnosis present

## 2020-01-20 DIAGNOSIS — R2689 Other abnormalities of gait and mobility: Secondary | ICD-10-CM | POA: Insufficient documentation

## 2020-01-20 DIAGNOSIS — R262 Difficulty in walking, not elsewhere classified: Secondary | ICD-10-CM | POA: Diagnosis present

## 2020-01-20 NOTE — Therapy (Signed)
[place] MAIN [facility] SERVICES [address] [place], [place], [address] Phone: [phone]   Fax:  [phone]  Occupational Therapy Treatment  Patient Details  Name: Kathryn Coffey MRN: [id number] Date of Birth: Jan 04, 1971 Referring Provider (OT): Raulkar   Encounter Date: 01/20/2020  OT End of Session - 01/20/20 1651    Visit Number  28    Number of Visits  34    Date for OT Re-Evaluation  02/25/20    Authorization Type  Progress report period starting  on 12/10/2019    OT Start Time  1600    OT Stop Time  1645    OT Time Calculation (min)  45 min    Activity Tolerance  Patient tolerated treatment well    Behavior During Therapy  [facility] for tasks assessed/performed       Past Medical History:  Diagnosis Date  . Abdominal wall cellulitis 2013 and 2014  . Asthma    allergic to grasses  . B12 deficiency   . Complication of anesthesia   . Costochondritis   . Diabetes mellitus without complication ([place])   . Gastric anomaly    multiple small ulcers  . GERD (gastroesophageal reflux disease)   . Herpes 06/2018   POSSIBLY IN EYE- PT TAKING VALTREX AND WILL SEE OPTHAMOLOGIST TO SEE IF VALTREX IS WORKING  . History of abnormal cervical Pap smear   . History of Clostridium difficile colitis   . History of hiatal hernia   . History of kidney stones   . Hypertension   . IBS (irritable bowel syndrome)   . Migraine   . Morbid obesity ([place])    s/p attempted gastric banding now decompressed  . PCOS (polycystic ovarian syndrome)   . PONV (postoperative nausea and vomiting)    WITH SPINAL ONLY  . Stroke [facility])     Past Surgical History:  Procedure Laterality Date  . BREAST EXCISIONAL BIOPSY Left    cyst excision - Dr. Patty Sermons  . CESAREAN SECTION    . CHOLECYSTECTOMY    . COLONOSCOPY    . EAR CYST EXCISION Right 07/04/2018   Procedure: EXCISION GLOMUS TUMOR THUMBNAIL;  Surgeon: Corky Mull, MD;  Location: [facility] ORS;  Service:  Orthopedics;  Laterality: Right;  . ESOPHAGOGASTRODUODENOSCOPY    . ESOPHAGOGASTRODUODENOSCOPY (EGD) WITH PROPOFOL N/A 12/06/2015   Procedure: ESOPHAGOGASTRODUODENOSCOPY (EGD) WITH PROPOFOL;  Surgeon: Manya Silvas, MD;  Location: [facility] ENDOSCOPY;  Service: Endoscopy;  Laterality: N/A;  . IR ANGIO INTRA EXTRACRAN SEL INTERNAL CAROTID UNI L MOD SED  07/15/2019  . IR ANGIO VERTEBRAL SEL SUBCLAVIAN INNOMINATE UNI R MOD SED  07/15/2019  . IR CT HEAD LTD  07/15/2019  . IR INTRAVSC STENT CERV CAROTID W/O EMB-PROT MOD SED INC ANGIO  07/15/2019  . IR PERCUTANEOUS ART THROMBECTOMY/INFUSION INTRACRANIAL INC DIAG ANGIO  07/15/2019  . LAPAROSCOPIC GASTRIC BANDING    . LAPAROSCOPIC GASTRIC RESTRICTIVE DUODENAL PROCEDURE (DUODENAL SWITCH) N/A 01/25/2016   Procedure: LAPAROSCOPIC GASTRIC RESTRICTIVE DUODENAL PROCEDURE (DUODENAL SWITCH);  Surgeon: Ladora Daniel, MD;  Location: [facility] ORS;  Service: General;  Laterality: N/A;  . OVARY SURGERY     x2  . RADIOLOGY WITH ANESTHESIA N/A 07/15/2019   Procedure: IR WITH ANESTHESIA;  Surgeon: Luanne Bras, MD;  Location: [place];  Service: Radiology;  Laterality: N/A;  . TONSILLECTOMY    . TUBAL LIGATION    . UMBILICAL HERNIA REPAIR N/A 01/25/2016   Procedure: LAPAROSCOPIC UMBILICAL HERNIA;  Surgeon: Ladora Daniel,  MD;  Location: [facility] ORS;  Service: General;  Laterality: N/A;  . UMBILICAL HERNIA REPAIR N/A 10/20/2016   Procedure: HERNIA REPAIR UMBILICAL ADULT;  Surgeon: Leonie Green, MD;  Location: [facility] ORS;  Service: General;  Laterality: N/A;    There were no vitals filed for this visit.  Subjective Assessment - 01/20/20 1650    Subjective   Pt. reports that the pool was closed for [employer] Day secondary to a pump being broken.    Patient is accompanied by:  Family member    Pertinent History  Per pt. chart. Pt. is a 49 y.o. female with history of HTN, T2DM, obesity who was admitted on 07/15/19 with onset of HA progressing to flaccid left hemiparesis a  few hours later.  CT head showed acute nonhemorrhagic infarct in right basal ganglia with hyperdense right MCA sign.  CT A/P head neck showed perfusion deficit with occluded right ICA and slow flow in M1.  She underwent cerebral angiogram with revascularization of right MCA with T1C1 revascularization and repair of right ICA with flow diverter device. Stroke felt to be embolic likely due to ICA dissection. Postprocedure head CT was negative for bleed repeat CTA head neck showed reocclusion of right ICA origin and occlusion of right ICA stent but now with patent right MCA. Pt. received inpatient rehab services for 3 weeks, and home health services.    Limitations  LUE AROM    Patient Stated Goals  Patient would like to be as independent as possible, return to her prior level of function    Currently in Pain?  No/denies      OT TREATMENT   Neuro muscular re-education:  Pt. worked on grasping 1" cubes, and actively releasing them.   Therapeutic Exercise:  Pt. worked on left shoulder stabilization exercises in supine with the shoulder flexed to 90 degrees, and the elbow in extension. Pt. was able toinitiate active shoulder stabilization responses consistently with less support proximally at the  elbow in extension.Pt. Performed circular movements.Pt. was able to actively stabilize her shoulderand actively initiate repositioning of the shoulderwith her left shoulder flexed to 90 degrees, and elbow extended. Pt. was able to maintain the position. Pt. was able to perform scapular protraction with support provided at the elbow, and forearm.Pt.worked on AAROM shoulder flexion in sidelying for a gravity eliminated plane using the UE Rangerwhile in sidelying. Pt. worked on active digit extension sitting with the hand positioned at the tabletop.  Pt. worked on facilitating digit extension, as well as actively holding with resistance. Pt worked on active thumb r=adial, and palmar abduction, and thumb  IP extension.  Pt. is making excellent progress with LUE functioning, however  Presented with increased flexor tone in the digits  And required increased weightbearing, and proprioception at the tabletop between grasping tasks. Pt.continues to workon shoulder stabilization exercises at home.Pt. continues to make steady progress overall.Pt.was able toactivate wrist extension, as well as2nd, 3rd, 4th, and 5thdigit extensiontoday with resistance with her forearm, and hand positioned at the tabletop.Pt.is able to consistently initiate active left shoulder horizontal abduction, elbow flexion, and extension, forearm supination, wrist extension, digit extension with facilitation, and thumb abduction, as well as active thumb IP extension.Pt.continues to work on improving her ability toactively,a nd functionally grasp, and release objects.Pt. continues torequirework on improvingshoulder stabilization, wrist, and digitmovements for hand to face patterns, and to increase LUE engagement during ADLs, and IADL tasks.  OT Education - 01/20/20 1651    Education Details  shoulder stabilization exercises, finger extension    Person(s) Educated  Patient    Methods  Demonstration;Explanation;Verbal cues    Comprehension  Returned demonstration;Verbalized understanding;Verbal cues required          OT Long Term Goals - 12/10/19 1619      OT LONG TERM GOAL #1   Title  Pt. will increase left shoulder flexion PROM by 20 degrees to assist with UE dressing    Baseline  Shoulder flexion sitting 38 degrees, supine: 18(135).Pt. continues to present with limited left shoulder PROM    Time  12    Period  Weeks    Status  On-going    Target Date  02/25/20      OT LONG TERM GOAL #2   Title  Pt. will be able to actively stabilize left shoulder for 10 seconds in supine with shoulder flexed to 90 degrees, and elbow in extension in preparation for functional  reaching.    Baseline  Pt. was able to initate shoulder stabilization with her shoulder flexed to 90 degrees, and her elbow extended for 5 sec. each for 3  reps. Pt. continues to activate intermittent muscle responses.    Time  12    Period  Weeks    Status  On-going    Target Date  02/25/20      OT LONG TERM GOAL #3   Title  Pt. will independently perform hand to face patterns with the LUE in preparation for light self grooming tasks.    Baseline  Pt. is able to initiate performing hand to face patterns, and is able to reach her face, however is unable to sustain, and use her hand at her face.    Time  12    Period  Weeks    Status  On-going    Target Date  02/25/20      OT LONG TERM GOAL #4   Title  Pt. will initiate 10 degrees of active left wrist extension  in preparation for actively lifting her hand off of a surface.    Baseline  Wrist extension: 5(60)    Time  12    Period  Weeks    Status  On-going    Target Date  02/25/20      OT LONG TERM GOAL #5   Title  Pt. will initiate left hand digit extension in order to be able to actively release an objects during ADL tasks.    Baseline  Pt. presents with consistent 2nd, and 5th digit extension.    Time  12    Period  Weeks    Status  On-going    Target Date  02/25/20      OT LONG TERM GOAL #6   Title  Pt. increase left hand digit flexion in order to be able to hold her toothbrush while applying toothpaste with her left right hand.    Baseline  Pt. is initiating activating gross digit flexors.    Time  12    Period  Weeks    Status  On-going    Target Date  02/25/20            Plan - 01/20/20 1652    Clinical Impression Statement  Pt. is making excellent progress with LUE functioning, however  Presented with increased flexor tone in the digits  And required increased weightbearing, and proprioception at the tabletop between grasping tasks. Pt.continues to workon  shoulder stabilization exercises at home.Pt. continues  to make steady progress overall.Pt.was able toactivate wrist extension, as well as2nd, 3rd, 4th, and 5thdigit extensiontoday with resistance with her forearm, and hand positioned at the tabletop.Pt.is able to consistently initiate active left shoulder horizontal abduction, elbow flexion, and extension, forearm supination, wrist extension, digit extension with facilitation, and thumb abduction, as well as active thumb IP extension.Pt.continues to work on improving her ability toactively,a nd functionally grasp, and release objects.Pt. continues torequirework on improvingshoulder stabilization, wrist, and digitmovements for hand to face patterns, and to increase LUE engagement during ADLs, and IADL tasks.   OT Occupational Profile and History  Detailed Assessment- Review of Records and additional review of physical, cognitive, psychosocial history related to current functional performance    Occupational performance deficits (Please refer to evaluation for details):  ADL's;IADL's    Rehab Potential  Good    Clinical Decision Making  Several treatment options, min-mod task modification necessary    Comorbidities Affecting Occupational Performance:  May have comorbidities impacting occupational performance    Modification or Assistance to Complete Evaluation   Min-Moderate modification of tasks or assist with assess necessary to complete eval    OT Frequency  2x / week    OT Duration  12 weeks    OT Treatment/Interventions  Self-care/ADL training;Moist Heat;DME and/or AE instruction;Neuromuscular education;Therapeutic exercise;Therapeutic activities;Energy conservation;Patient/family education    Consulted and Agree with Plan of Care  Patient       Patient will benefit from skilled therapeutic intervention in order to improve the following deficits and impairments:           Visit Diagnosis: Muscle weakness (generalized)  Other lack of coordination    Problem List Patient  Active Problem List   Diagnosis Date Noted  . E. coli UTI   . Diabetes mellitus type 2 in obese ([place])   . Chronic pain of right knee   . 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 Hemiparesis affecting left side as late effect of stroke ([place])   . Acute ischemic right middle cerebral artery (MCA) stroke ([place]) 07/22/2019  . Carotid artery dissection  (H[facility]) s/p stent placement 07/21/2019  . Diabetes mellitus type II, uncontrolled ([place]) 07/21/2019  . Acute blood loss anemia 07/21/2019  . Leukocytosis 07/21/2019  . Stroke (cerebrum) ([place]) - R MCA infarct s/p tenecteplase and mechanical thrombectomy w/ d/t ICA dissection  07/15/2019  . Middle cerebral artery embolism, right 07/15/2019  . Controlled type 2 diabetes mellitus without complication, without long-term current use of insulin ([place]) 05/28/2017  . Attention deficit hyperactivity disorder (ADHD), combined type 04/18/2017  . Morbid obesity ([place]) 01/25/2016  . Umbilical hernia [id number]  . Hiatal hernia 12/08/2015  . History of Clostridium difficile colitis 06/19/2014  . HTN (hypertension) 12/23/2013  . PCOS (polycystic ovarian syndrome) 12/23/2013  . Bariatric surgery status 01/28/2013    Harrel Carina, MS, OTR/L 01/20/2020, 4:54 PM  [place] MAIN [facility] SERVICES [address] [place], [place], [address] Phone: [phone]   Fax:  [phone]  Name: Kathryn Coffey MRN: [id number] Date of Birth: [birth year]

## 2020-01-20 NOTE — Therapy (Signed)
[place] MAIN [facility] SERVICES [address] [place], [place], [address] Phone: [phone]   Fax:  [phone]  Physical Therapy Progress Note/Recertification   Dates of reporting period  11/03/19   to   01/20/20  Patient Details  Name: Kathryn Coffey MRN: [id number] Date of Birth: [birth year] No data recorded  Encounter Date: 01/20/2020  PT End of Session - 01/20/20 1523    Visit Number  20    Number of Visits  73    Date for PT Re-Evaluation  04/13/20    PT Start Time  1518    PT Stop Time  1600    PT Time Calculation (min)  42 min    Equipment Utilized During Treatment  Gait belt    Activity Tolerance  Patient tolerated treatment well    Behavior During Therapy  WFL for tasks assessed/performed       Past Medical History:  Diagnosis Date  . Abdominal wall cellulitis 2013 and 2014  . Asthma    allergic to grasses  . B12 deficiency   . Complication of anesthesia   . Costochondritis   . Diabetes mellitus without complication ([place])   . Gastric anomaly    multiple small ulcers  . GERD (gastroesophageal reflux disease)   . Herpes 06/2018   POSSIBLY IN EYE- PT TAKING VALTREX AND WILL SEE OPTHAMOLOGIST TO SEE IF VALTREX IS WORKING  . History of abnormal cervical Pap smear   . History of Clostridium difficile colitis   . History of hiatal hernia   . History of kidney stones   . Hypertension   . IBS (irritable bowel syndrome)   . Migraine   . Morbid obesity ([place])    s/p attempted gastric banding now decompressed  . PCOS (polycystic ovarian syndrome)   . PONV (postoperative nausea and vomiting)    WITH SPINAL ONLY  . Stroke [facility])     Past Surgical History:  Procedure Laterality Date  . BREAST EXCISIONAL BIOPSY Left    cyst excision - Dr. Patty Sermons  . CESAREAN SECTION    . CHOLECYSTECTOMY    . COLONOSCOPY    . EAR CYST EXCISION Right 07/04/2018   Procedure: EXCISION GLOMUS TUMOR THUMBNAIL;  Surgeon: Corky Mull, MD;   Location: [facility] ORS;  Service: Orthopedics;  Laterality: Right;  . ESOPHAGOGASTRODUODENOSCOPY    . ESOPHAGOGASTRODUODENOSCOPY (EGD) WITH PROPOFOL N/A 12/06/2015   Procedure: ESOPHAGOGASTRODUODENOSCOPY (EGD) WITH PROPOFOL;  Surgeon: Manya Silvas, MD;  Location: [facility] ENDOSCOPY;  Service: Endoscopy;  Laterality: N/A;  . IR ANGIO INTRA EXTRACRAN SEL INTERNAL CAROTID UNI L MOD SED  07/15/2019  . IR ANGIO VERTEBRAL SEL SUBCLAVIAN INNOMINATE UNI R MOD SED  07/15/2019  . IR CT HEAD LTD  07/15/2019  . IR INTRAVSC STENT CERV CAROTID W/O EMB-PROT MOD SED INC ANGIO  07/15/2019  . IR PERCUTANEOUS ART THROMBECTOMY/INFUSION INTRACRANIAL INC DIAG ANGIO  07/15/2019  . LAPAROSCOPIC GASTRIC BANDING    . LAPAROSCOPIC GASTRIC RESTRICTIVE DUODENAL PROCEDURE (DUODENAL SWITCH) N/A 01/25/2016   Procedure: LAPAROSCOPIC GASTRIC RESTRICTIVE DUODENAL PROCEDURE (DUODENAL SWITCH);  Surgeon: Ladora Daniel, MD;  Location: [facility] ORS;  Service: General;  Laterality: N/A;  . OVARY SURGERY     x2  . RADIOLOGY WITH ANESTHESIA N/A 07/15/2019   Procedure: IR WITH ANESTHESIA;  Surgeon: Luanne Bras, MD;  Location: [place];  Service: Radiology;  Laterality: N/A;  . TONSILLECTOMY    . TUBAL LIGATION    . UMBILICAL HERNIA REPAIR N/A 01/25/2016  Procedure: LAPAROSCOPIC UMBILICAL HERNIA;  Surgeon: Ladora Daniel, MD;  Location: [facility] ORS;  Service: General;  Laterality: N/A;  . UMBILICAL HERNIA REPAIR N/A 10/20/2016   Procedure: HERNIA REPAIR UMBILICAL ADULT;  Surgeon: Leonie Green, MD;  Location: [facility] ORS;  Service: General;  Laterality: N/A;    There were no vitals filed for this visit.  Subjective Assessment - 01/20/20 1522    Subjective  Patient reports she is doing well today. No resting pain reported upon arrival. She is wearing her knee brace on the L side which continues helping prevent hyperextension. She reports that now if she walks without the brace she has knee pain and her kneecap feels like it is "grinding."  Compliant with HEP. No specific questions or concerns currently.    Pertinent History  Patient was in the hospital [place] and then in patient rehab 3 weeks. She was dicharged 08/13/19 and then HHPT and was discharged 09/03/19. She has not had any falls.    Currently in Pain?  No/denies         [facility] PT Assessment - 01/20/20 1532      6 Minute Walk- Baseline   6 Minute Walk- Baseline  yes    BP (mmHg)  150/88    HR (bpm)  86    02 Sat (%RA)  100 %    Modified Borg Scale for Dyspnea  0- Nothing at all    Perceived Rate of Exertion (Borg)  6-      6 Minute walk- Post Test   6 Minute Walk Post Test  yes    BP (mmHg)  (!) 173/94    HR (bpm)  110    02 Sat (%RA)  99 %    Modified Borg Scale for Dyspnea  2- Mild shortness of breath    Perceived Rate of Exertion (Borg)  11- Fairly light      6 minute walk test results    Aerobic Endurance Distance Walked  995    Endurance additional comments  no assistive device, L knee cage, CGA       Berg Balance Test   Sit to Stand  Able to stand without using hands and stabilize independently    Standing Unsupported  Able to stand safely 2 minutes    Sitting with Back Unsupported but Feet Supported on Floor or Stool  Able to sit safely and securely 2 minutes    Stand to Sit  Sits safely with minimal use of hands    Transfers  Able to transfer safely, minor use of hands    Standing Unsupported with Eyes Closed  Able to stand 10 seconds safely    Standing Unsupported with Feet Together  Able to place feet together independently and stand 1 minute safely    From Standing, Reach Forward with Outstretched Arm  Can reach confidently >25 cm (10")    From Standing Position, Pick up Object from Floor  Able to pick up shoe safely and easily    From Standing Position, Turn to Look Behind Over each Shoulder  Looks behind from both sides and weight shifts well    Turn 360 Degrees  Able to turn 360 degrees safely but slowly    Standing Unsupported,  Alternately Place Feet on Step/Stool  Able to stand independently and safely and complete 8 steps in 20 seconds    Standing Unsupported, One Foot in Front  Able to place foot tandem independently and hold 30 seconds  Standing on One Leg  Able to lift leg independently and hold > 10 seconds    Total Score  54    Berg comment:  RLE>10s, LLE >5s but <10s        TREATMENT   Neuromuscular Re-education   OUTCOME MEASURES: TEST 11/03/19 01/20/20 Interpretation  5 times sit<>stand 12.2s (15.28s initial evaluation) 9.6s >60 yo, >15 sec indicates increased risk for falls  10 meter walk test self-selected: 13.2s = 0.76 m/s, fastest: 12.5s = 0.8 m/s (0.51 m/s initial evaluation) self-selected: 13.4s =  0.75 m/s, fastest: 10.7s = 0.93 m/s <1.0 m/s indicates increased risk for falls; limited community ambulator  Timed up and Go 12.9s (19.36sec initial evaluation) 10.9s <14 sec indicates increased risk for falls  6 minute walk test 817'(525' initial evaluation) 995' 1000 feet is community Water quality scientist 51/56; (48/56 initial evaluation) 54/56 <36/56 (100% risk for falls), 37-45 (80% risk for falls); 46-51 (>50% risk for falls); 52-55 (lower risk <25% of falls)      Outcome measures and goals updated with patient today. Pt demonstrates significant improvement in her 5TSTS, 62mgait speed (fastest), TUG, 6MWT and BERG. She is making excellent progress toward all of her goals. She arrives to therapy with excellent motivation and has been performing her home exercise program. She will benefit from continued PT services to address deficits in strength, balance, and mobility in order to return to full function at home.                          PT Short Term Goals - 01/20/20 1638      PT SHORT TERM GOAL #1   Title  Patient will be independent in home exercise program to improve strength/mobility for better functional independence with ADLs.     Baseline  11/03/19: Pt performing HEP at least 5d/wk    Time  6    Period  Weeks    Status  Achieved    Target Date  --      PT SHORT TERM GOAL #2   Title  Patient (> 665years old) will complete five times sit to stand test in < 15 seconds indicating an increased LE strength and improved balance.    Baseline  eval: 15.28s; 11/03/19: 12.2s; 01/20/20: 9.6s    Time  6    Period  Weeks    Status  Achieved    Target Date  --        PT Long Term Goals - 01/20/20 1639      PT LONG TERM GOAL #1   Title  Patient will increase Berg Balance score by > 6 points to demonstrate decreased fall risk during functional activities.    Baseline  09/08/19: 48/56; 11/03/19: 51/56; 01/20/20: 54/56    Time  12    Period  Weeks    Status  Partially Met    Target Date  04/13/20      PT LONG TERM GOAL #2   Title  Patient will increase six minute walk test distance to >1200 for progression to community ambulator and improve gait ability    Baseline  09/08/19: 525', 11/03/19: 8161; 01/20/20: 995';    Time  12    Period  Weeks    Status  Partially Met    Target Date  04/13/20      PT LONG TERM GOAL #3   Title  Patient will increase 10 meter walk test to >  1.35ms as to improve gait speed for better community ambulation and to reduce fall risk    Baseline  09/08/19:  0.51 m/s; 11/03/19: self-selected: 13.2s = 0.76 m/s, fastest: 12.5s = 0.8 m/s; 01/20/20: self-selected: 13.4s =  0.75 m/s, fastest: 10.7s = 0.93 m/s    Time  12    Period  Weeks    Status  Partially Met    Target Date  03/16/20            Plan - 01/20/20 1523    Clinical Impression Statement  Outcome measures and goals updated with patient today. Pt demonstrates significant improvement in her 5TSTS, 118mait speed (fastest), TUG, 6MWT and BERG. She is making excellent progress toward all of her goals. She arrives to therapy with excellent motivation and has been performing her home exercise program. She will benefit from continued PT services to  address deficits in strength, balance, and mobility in order to return to full function at home.    Personal Factors and Comorbidities  Comorbidity 1    Comorbidities  diabetes, high blood pressure, reflux,    Examination-Activity Limitations  Carry;Caring for Others;Bend;Locomotion Level;Self Feeding;Sleep;Toileting    Examination-Participation Restrictions  Cleaning;Community Activity;Driving;Laundry;Shop    Stability/Clinical Decision Making  Stable/Uncomplicated    Rehab Potential  Good    PT Frequency  2x / week    PT Duration  12 weeks    PT Treatment/Interventions  Manual techniques;Functional mobility training;Stair training;Gait training;Electrical Stimulation;Moist Heat;Ultrasound;Therapeutic exercise;Balance training;Neuromuscular re-education    PT Next Visit Plan  strengthening and balance    PT Home Exercise Plan  standing hip abd,    Consulted and Agree with Plan of Care  Patient       Patient will benefit from skilled therapeutic intervention in order to improve the following deficits and impairments:  Abnormal gait, Decreased knowledge of use of DME, Dizziness, Pain, Decreased coordination, Impaired UE functional use, Decreased strength, Decreased endurance, Decreased activity tolerance, Decreased balance, Difficulty walking, Obesity  Visit Diagnosis: Muscle weakness (generalized)  Difficulty in walking, not elsewhere classified     Problem List Patient Active Problem List   Diagnosis Date Noted  . E. coli UTI   . Diabetes mellitus type 2 in obese ([facility]  . Chronic pain of right knee   . Flaccid monoplegia of upper extremity ([facility]  . Hemiparesis affecting left side as late effect of stroke ([facility] [place]  . Acute ischemic right middle cerebral artery (MCA) stroke (HCWh[place]12/08/2018  . Carotid artery dissection  (HCC) s/p stent placement 07/21/2019  . Diabetes mellitus type II, uncontrolled (HCByrd[place]11/30/2020  . Acute blood loss anemia 07/21/2019  . Leukocytosis 07/21/2019   . Stroke (cerebrum) ([facility]- R MCA infarct s/p tenecteplase and mechanical thrombectomy w/ d/t ICA dissection  07/15/2019  . Middle cerebral artery embolism, right 07/15/2019  . Controlled type 2 diabetes mellitus without complication, without long-term current use of insulin ([facility]CO[facility] [place]10/03/2017  . Attention deficit hyperactivity disorder (ADHD), combined type 04/18/2017  . Morbid obesity (HCMission06/01/2016  . Umbilical hernia 0554/98/2641. Hiatal hernia 12/08/2015  . History of Clostridium difficile colitis 06/19/2014  . HTN (hypertension) 12/23/2013  . PCOS (polycystic ovarian syndrome) 12/23/2013  . Bariatric surgery status 01/28/2013   JaPhillips GroutT, DPT, GCS  Naliah Eddington 01/20/2020, 4:47 PM  [place] RE[facility] System[facility] [address]dHigh PointNCAl[place]hone: [phone] Fax:  [phone]: Kathryn MESSMERRN: [id number]  Date of Birth: [birth year]

## 2020-01-22 ENCOUNTER — Ambulatory Visit: Payer: No Typology Code available for payment source | Admitting: Occupational Therapy

## 2020-01-26 ENCOUNTER — Other Ambulatory Visit: Payer: Self-pay

## 2020-01-26 ENCOUNTER — Encounter: Payer: Self-pay | Admitting: Occupational Therapy

## 2020-01-26 ENCOUNTER — Ambulatory Visit: Payer: No Typology Code available for payment source | Admitting: Occupational Therapy

## 2020-01-26 ENCOUNTER — Ambulatory Visit: Payer: No Typology Code available for payment source

## 2020-01-26 DIAGNOSIS — M6281 Muscle weakness (generalized): Secondary | ICD-10-CM | POA: Diagnosis not present

## 2020-01-26 DIAGNOSIS — R262 Difficulty in walking, not elsewhere classified: Secondary | ICD-10-CM

## 2020-01-26 DIAGNOSIS — R278 Other lack of coordination: Secondary | ICD-10-CM

## 2020-01-26 NOTE — Therapy (Addendum)
[place] MAIN [facility] SERVICES [address] [place], [place], [address] Phone: [phone]   Fax:  [phone]  Physical Therapy Treatment  Patient Details  Name: Kathryn Coffey MRN: [id number] Date of Birth: Jul 01, 1971 No data recorded  Encounter Date: 01/26/2020  PT End of Session - 01/26/20 1446    Visit Number  21    Number of Visits  56    Date for PT Re-Evaluation  04/13/20    PT Start Time  1441    PT Stop Time  1515    PT Time Calculation (min)  34 min    Equipment Utilized During Treatment  Gait belt    Activity Tolerance  Patient tolerated treatment well    Behavior During Therapy  WFL for tasks assessed/performed       Past Medical History:  Diagnosis Date  . Abdominal wall cellulitis 2013 and 2014  . Asthma    allergic to grasses  . B12 deficiency   . Complication of anesthesia   . Costochondritis   . Diabetes mellitus without complication ([place])   . Gastric anomaly    multiple small ulcers  . GERD (gastroesophageal reflux disease)   . Herpes 06/2018   POSSIBLY IN EYE- PT TAKING VALTREX AND WILL SEE OPTHAMOLOGIST TO SEE IF VALTREX IS WORKING  . History of abnormal cervical Pap smear   . History of Clostridium difficile colitis   . History of hiatal hernia   . History of kidney stones   . Hypertension   . IBS (irritable bowel syndrome)   . Migraine   . Morbid obesity ([place])    s/p attempted gastric banding now decompressed  . PCOS (polycystic ovarian syndrome)   . PONV (postoperative nausea and vomiting)    WITH SPINAL ONLY  . Stroke [facility])     Past Surgical History:  Procedure Laterality Date  . BREAST EXCISIONAL BIOPSY Left    cyst excision - Dr. Patty Sermons  . CESAREAN SECTION    . CHOLECYSTECTOMY    . COLONOSCOPY    . EAR CYST EXCISION Right 07/04/2018   Procedure: EXCISION GLOMUS TUMOR THUMBNAIL;  Surgeon: Corky Mull, MD;  Location: [facility] ORS;  Service: Orthopedics;  Laterality: Right;  .  ESOPHAGOGASTRODUODENOSCOPY    . ESOPHAGOGASTRODUODENOSCOPY (EGD) WITH PROPOFOL N/A 12/06/2015   Procedure: ESOPHAGOGASTRODUODENOSCOPY (EGD) WITH PROPOFOL;  Surgeon: Manya Silvas, MD;  Location: [facility] ENDOSCOPY;  Service: Endoscopy;  Laterality: N/A;  . IR ANGIO INTRA EXTRACRAN SEL INTERNAL CAROTID UNI L MOD SED  07/15/2019  . IR ANGIO VERTEBRAL SEL SUBCLAVIAN INNOMINATE UNI R MOD SED  07/15/2019  . IR CT HEAD LTD  07/15/2019  . IR INTRAVSC STENT CERV CAROTID W/O EMB-PROT MOD SED INC ANGIO  07/15/2019  . IR PERCUTANEOUS ART THROMBECTOMY/INFUSION INTRACRANIAL INC DIAG ANGIO  07/15/2019  . LAPAROSCOPIC GASTRIC BANDING    . LAPAROSCOPIC GASTRIC RESTRICTIVE DUODENAL PROCEDURE (DUODENAL SWITCH) N/A 01/25/2016   Procedure: LAPAROSCOPIC GASTRIC RESTRICTIVE DUODENAL PROCEDURE (DUODENAL SWITCH);  Surgeon: Ladora Daniel, MD;  Location: [facility] ORS;  Service: General;  Laterality: N/A;  . OVARY SURGERY     x2  . RADIOLOGY WITH ANESTHESIA N/A 07/15/2019   Procedure: IR WITH ANESTHESIA;  Surgeon: Luanne Bras, MD;  Location: [place];  Service: Radiology;  Laterality: N/A;  . TONSILLECTOMY    . TUBAL LIGATION    . UMBILICAL HERNIA REPAIR N/A 01/25/2016   Procedure: LAPAROSCOPIC UMBILICAL HERNIA;  Surgeon: Ladora Daniel, MD;  Location: [facility] ORS;  Service: General;  Laterality: N/A;  . UMBILICAL HERNIA REPAIR N/A 10/20/2016   Procedure: HERNIA REPAIR UMBILICAL ADULT;  Surgeon: Leonie Green, MD;  Location: [facility] ORS;  Service: General;  Laterality: N/A;    There were no vitals filed for this visit.  Subjective Assessment - 01/26/20 1437    Subjective  Patient reports she is doing well today. She is complaining of 3/10 L hand pain upon arrival. She is wearing her knee brace on the L side which continues helping prevent hyperextension. She went to the pool for 2 days and was able to exercise in the water. Compliant with HEP. No specific questions or concerns currently.    Pertinent History  Patient was  in the hospital [place] and then in patient rehab 3 weeks. She was dicharged 08/13/19 and then HHPT and was discharged 09/03/19. She has not had any falls.    Currently in Pain?  Yes    Pain Score  3     Pain Location  Hand    Pain Orientation  Right    Pain Descriptors / Indicators  Aching;Sore    Pain Type  Chronic pain    Pain Onset  In the past 7 days    Pain Frequency  Constant          TREATMENT   Ther-ex NuStep L2-4 x 4 minutes for warm-up during history (3 minutes unbilled); Precor single leg press LLE: 55# x 15,x 10 (muscle failure obtained during both sets); Forward and L lateral BOSU lunges (round side up) x 10 each; Standing L TKE with green tband x 20; 6" alternaing step-ups with RUE support on // bars x 10 each; Pball wall squats 15s on/off x 5; Seated L HS curls with green tband x 20; Seated L LAQ with manual resistance x 20; Seated L DF with manual resistance at forefoot x 20;   Pt educated throughout session about proper posture and technique with exercises. Improved exercise technique, movement at target joints, use of target muscles after min to mod verbal, visual, tactile cues.   Pt demonstrates excellent motivation during sessiontoday. Pt arrived late so session had to be abbreviated slightly. Continued with terminal extension quad strength strengthening as well as quad endurance. She would like to know if she can use a knee sleeve for support instead of a knee cage and she already owns one that has medial/lateral hinged support. Pt encouraged to bring it in to therapy to try out. Pt encouraged to continue HEP and follow-up as scheduled.  She will benefit from PT services to address deficits in strength, balance, and mobility in order to return to full function at home.                                   PT Short Term Goals - 01/20/20 1638      PT SHORT TERM GOAL #1   Title  Patient will be independent in home  exercise program to improve strength/mobility for better functional independence with ADLs.    Baseline  11/03/19: Pt performing HEP at least 5d/wk    Time  6    Period  Weeks    Status  Achieved    Target Date  --      PT SHORT TERM GOAL #2   Title  Patient (> 48 years old) will complete five times sit to stand test in < 15 seconds indicating  an increased LE strength and improved balance.    Baseline  eval: 15.28s; 11/03/19: 12.2s; 01/20/20: 9.6s    Time  6    Period  Weeks    Status  Achieved    Target Date  --        PT Long Term Goals - 01/20/20 1639      PT LONG TERM GOAL #1   Title  Patient will increase Berg Balance score by > 6 points to demonstrate decreased fall risk during functional activities.    Baseline  09/08/19: 48/56; 11/03/19: 51/56; 01/20/20: 54/56    Time  12    Period  Weeks    Status  Partially Met    Target Date  04/13/20      PT LONG TERM GOAL #2   Title  Patient will increase six minute walk test distance to >1200 for progression to community ambulator and improve gait ability    Baseline  09/08/19: 525', 11/03/19: 007'; 01/20/20: 995';    Time  12    Period  Weeks    Status  Partially Met    Target Date  04/13/20      PT LONG TERM GOAL #3   Title  Patient will increase 10 meter walk test to >1.5ms as to improve gait speed for better community ambulation and to reduce fall risk    Baseline  09/08/19:  0.51 m/s; 11/03/19: self-selected: 13.2s = 0.76 m/s, fastest: 12.5s = 0.8 m/s; 01/20/20: self-selected: 13.4s =  0.75 m/s, fastest: 10.7s = 0.93 m/s    Time  12    Period  Weeks    Status  Partially Met    Target Date  03/16/20            Plan - 01/26/20 1446    Clinical Impression Statement  Pt demonstrates excellent motivation during session today. Pt arrived late so session had to be abbreviated slightly. Continued with terminal extension quad strength strengthening as well as quad endurance. She would like to know if she can use a knee sleeve for  support instead of a knee cage and she already owns one that has medial/lateral hinged support. Pt encouraged to bring it in to therapy to try out. Pt encouraged to continue HEP and follow-up as scheduled.  She will benefit from PT services to address deficits in strength, balance, and mobility in order to return to full function at home.    Personal Factors and Comorbidities  Comorbidity 1    Comorbidities  diabetes, high blood pressure, reflux,    Examination-Activity Limitations  Carry;Caring for Others;Bend;Locomotion Level;Self Feeding;Sleep;Toileting    Examination-Participation Restrictions  Cleaning;Community Activity;Driving;Laundry;Shop    Stability/Clinical Decision Making  Stable/Uncomplicated    Rehab Potential  Good    PT Frequency  2x / week    PT Duration  12 weeks    PT Treatment/Interventions  Manual techniques;Functional mobility training;Stair training;Gait training;Electrical Stimulation;Moist Heat;Ultrasound;Therapeutic exercise;Balance training;Neuromuscular re-education    PT Next Visit Plan  strengthening and balance    PT Home Exercise Plan  standing hip abd,    Consulted and Agree with Plan of Care  Patient       Patient will benefit from skilled therapeutic intervention in order to improve the following deficits and impairments:  Abnormal gait, Decreased knowledge of use of DME, Dizziness, Pain, Decreased coordination, Impaired UE functional use, Decreased strength, Decreased endurance, Decreased activity tolerance, Decreased balance, Difficulty walking, Obesity  Visit Diagnosis: Muscle weakness (generalized)  Difficulty in walking, not  elsewhere classified     Problem List Patient Active Problem List   Diagnosis Date Noted  . E. coli UTI   . Diabetes mellitus type 2 in obese ([place])   . Chronic pain of right knee   . Flaccid monoplegia of upper extremity ([place])   . Hemiparesis affecting left side as late effect of stroke ([place])   . Acute ischemic right  middle cerebral artery (MCA) stroke ([place]) 07/22/2019  . Carotid artery dissection  (HCC) s/p stent placement 07/21/2019  . Diabetes mellitus type II, uncontrolled ([place]) 07/21/2019  . Acute blood loss anemia 07/21/2019  . Leukocytosis 07/21/2019  . Stroke (cerebrum) ([place]) - R MCA infarct s/p tenecteplase and mechanical thrombectomy w/ d/t ICA dissection  07/15/2019  . Middle cerebral artery embolism, right 07/15/2019  . Controlled type 2 diabetes mellitus without complication, without long-term current use of insulin ([place]) 05/28/2017  . Attention deficit hyperactivity disorder (ADHD), combined type 04/18/2017  . Morbid obesity ([place]) 01/25/2016  . Umbilical hernia 11/64/3539  . Hiatal hernia 12/08/2015  . History of Clostridium difficile colitis 06/19/2014  . HTN (hypertension) 12/23/2013  . PCOS (polycystic ovarian syndrome) 12/23/2013  . Bariatric surgery status 01/28/2013   Phillips Grout PT, DPT, GCS  Huprich,Jason 01/26/2020, 4:20 PM  [place] MAIN [facility] SERVICES [address] [place], [place], [address] Phone: [phone]   Fax:  [phone]  Name: SHRUTHI NORTHRUP MRN: [id number] Date of Birth: Jun 01, 1971

## 2020-01-26 NOTE — Therapy (Signed)
[place] MAIN [facility] SERVICES [address] [place], [place], [address] Phone: [phone]   Fax:  [phone]  Occupational Therapy Treatment  Patient Details  Name: Kathryn Coffey MRN: [id number] Date of Birth: [birth year] Referring Provider (OT): Raulkar   Encounter Date: 01/26/2020  OT End of Session - 01/26/20 1608    Visit Number  29    Number of Visits  39    Date for OT Re-Evaluation  02/25/20    Authorization Type  Progress report period starting  on 12/10/2019    OT Start Time  1518    OT Stop Time  1600    OT Time Calculation (min)  42 min    Activity Tolerance  Patient tolerated treatment well    Behavior During Therapy  [facility] for tasks assessed/performed       Past Medical History:  Diagnosis Date  . Abdominal wall cellulitis 2013 and 2014  . Asthma    allergic to grasses  . B12 deficiency   . Complication of anesthesia   . Costochondritis   . Diabetes mellitus without complication ([place])   . Gastric anomaly    multiple small ulcers  . GERD (gastroesophageal reflux disease)   . Herpes 06/2018   POSSIBLY IN EYE- PT TAKING VALTREX AND WILL SEE OPTHAMOLOGIST TO SEE IF VALTREX IS WORKING  . History of abnormal cervical Pap smear   . History of Clostridium difficile colitis   . History of hiatal hernia   . History of kidney stones   . Hypertension   . IBS (irritable bowel syndrome)   . Migraine   . Morbid obesity ([place])    s/p attempted gastric banding now decompressed  . PCOS (polycystic ovarian syndrome)   . PONV (postoperative nausea and vomiting)    WITH SPINAL ONLY  . Stroke [facility])     Past Surgical History:  Procedure Laterality Date  . BREAST EXCISIONAL BIOPSY Left    cyst excision - Dr. Patty Sermons  . CESAREAN SECTION    . CHOLECYSTECTOMY    . COLONOSCOPY    . EAR CYST EXCISION Right 07/04/2018   Procedure: EXCISION GLOMUS TUMOR THUMBNAIL;  Surgeon: Corky Mull, MD;  Location: [facility] ORS;  Service:  Orthopedics;  Laterality: Right;  . ESOPHAGOGASTRODUODENOSCOPY    . ESOPHAGOGASTRODUODENOSCOPY (EGD) WITH PROPOFOL N/A 12/06/2015   Procedure: ESOPHAGOGASTRODUODENOSCOPY (EGD) WITH PROPOFOL;  Surgeon: Manya Silvas, MD;  Location: [facility] ENDOSCOPY;  Service: Endoscopy;  Laterality: N/A;  . IR ANGIO INTRA EXTRACRAN SEL INTERNAL CAROTID UNI L MOD SED  07/15/2019  . IR ANGIO VERTEBRAL SEL SUBCLAVIAN INNOMINATE UNI R MOD SED  07/15/2019  . IR CT HEAD LTD  07/15/2019  . IR INTRAVSC STENT CERV CAROTID W/O EMB-PROT MOD SED INC ANGIO  07/15/2019  . IR PERCUTANEOUS ART THROMBECTOMY/INFUSION INTRACRANIAL INC DIAG ANGIO  07/15/2019  . LAPAROSCOPIC GASTRIC BANDING    . LAPAROSCOPIC GASTRIC RESTRICTIVE DUODENAL PROCEDURE (DUODENAL SWITCH) N/A 01/25/2016   Procedure: LAPAROSCOPIC GASTRIC RESTRICTIVE DUODENAL PROCEDURE (DUODENAL SWITCH);  Surgeon: Ladora Daniel, MD;  Location: [facility] ORS;  Service: General;  Laterality: N/A;  . OVARY SURGERY     x2  . RADIOLOGY WITH ANESTHESIA N/A 07/15/2019   Procedure: IR WITH ANESTHESIA;  Surgeon: Luanne Bras, MD;  Location: [place];  Service: Radiology;  Laterality: N/A;  . TONSILLECTOMY    . TUBAL LIGATION    . UMBILICAL HERNIA REPAIR N/A 01/25/2016   Procedure: LAPAROSCOPIC UMBILICAL HERNIA;  Surgeon: Ladora Daniel,  MD;  Location: [facility] ORS;  Service: General;  Laterality: N/A;  . UMBILICAL HERNIA REPAIR N/A 10/20/2016   Procedure: HERNIA REPAIR UMBILICAL ADULT;  Surgeon: Leonie Green, MD;  Location: [facility] ORS;  Service: General;  Laterality: N/A;    There were no vitals filed for this visit.  Subjective Assessment - 01/26/20 1606    Subjective   Pt. reports that she was able to get into the pool this weekend.    Patient is accompanied by:  Family member    Pertinent History  Per pt. chart. Pt. is a 49 y.o. female with history of HTN, T2DM, obesity who was admitted on 07/15/19 with onset of HA progressing to flaccid left hemiparesis a few hours later.  CT  head showed acute nonhemorrhagic infarct in right basal ganglia with hyperdense right MCA sign.  CT A/P head neck showed perfusion deficit with occluded right ICA and slow flow in M1.  She underwent cerebral angiogram with revascularization of right MCA with T1C1 revascularization and repair of right ICA with flow diverter device. Stroke felt to be embolic likely due to ICA dissection. Postprocedure head CT was negative for bleed repeat CTA head neck showed reocclusion of right ICA origin and occlusion of right ICA stent but now with patent right MCA. Pt. received inpatient rehab services for 3 weeks, and home health services.    Patient Stated Goals  Patient would like to be as independent as possible, return to her prior level of function    Currently in Pain?  No/denies      OT TREATMENT   Neuro muscular re-education:  Pt. worked on grasping 1" circular objects, and actively releasing them.  Therapeutic Exercise:  Pt. worked on left shoulder stabilization exercises in supine with the shoulder flexed to 90 degrees, and the elbow in extension. Pt. was able toinitiate active shoulder stabilization responsesconsistentlywith less supportproximally at theelbow in extension.Pt.Performedcircular movements.Pt. was able to actively stabilize her shoulderand actively initiate repositioning of the shoulderwith her left shoulder flexed to 90 degrees, and elbow extended. Pt.was able to maintain the position.Pt. was able to perform scapular protraction with support provided at the elbow, and forearm.Pt.worked on AAROM shoulder flexion in sidelying for a gravity eliminated plane using the UE Rangerwhile in sidelying. Pt. worked on active digit extension sitting with the hand positioned at the tabletop.  Pt. worked on facilitating digit extension, as well as actively holding with resistance.   Pt.  continues to  excellent progress with LUE functioning, however presented with limited grass  digit extension when releasing objects. Pt. was able to extend her digits. Pt. presented with increased flexor tone in the digits, and required increased weightbearing, and proprioception at the tabletop between grasping tasks. Pt.continues to workon shoulder stabilization exercises at home.Pt. continues to make steady progress overall.Pt.was able toactivate wrist extension, as well as2nd, 3rd, 4th, and 5thdigit extensiontoday with resistance with her forearm, and hand positioned at the tabletop.Pt.is able to consistently initiate active left shoulder horizontal abduction, elbow flexion, and extension, forearm supination, wrist extension, digit extension with facilitation, and thumb abduction, as well as active thumb IP extension.Pt.continues to work on improving her ability toactively, and functionally grasp, and release objects.Pt. continues torequirework on improvingshoulder stabilization, wrist, and digitmovements for hand to face patterns, and to increase LUE engagement during ADLs, and IADL tasks.                             OT  Long Term Goals - 12/10/19 1619      OT LONG TERM GOAL #1   Title  Pt. will increase left shoulder flexion PROM by 20 degrees to assist with UE dressing    Baseline  Shoulder flexion sitting 38 degrees, supine: 18(135).Pt. continues to present with limited left shoulder PROM    Time  12    Period  Weeks    Status  On-going    Target Date  02/25/20      OT LONG TERM GOAL #2   Title  Pt. will be able to actively stabilize left shoulder for 10 seconds in supine with shoulder flexed to 90 degrees, and elbow in extension in preparation for functional reaching.    Baseline  Pt. was able to initate shoulder stabilization with her shoulder flexed to 90 degrees, and her elbow extended for 5 sec. each for 3  reps. Pt. continues to activate intermittent muscle responses.    Time  12    Period  Weeks    Status  On-going    Target  Date  02/25/20      OT LONG TERM GOAL #3   Title  Pt. will independently perform hand to face patterns with the LUE in preparation for light self grooming tasks.    Baseline  Pt. is able to initiate performing hand to face patterns, and is able to reach her face, however is unable to sustain, and use her hand at her face.    Time  12    Period  Weeks    Status  On-going    Target Date  02/25/20      OT LONG TERM GOAL #4   Title  Pt. will initiate 10 degrees of active left wrist extension  in preparation for actively lifting her hand off of a surface.    Baseline  Wrist extension: 5(60)    Time  12    Period  Weeks    Status  On-going    Target Date  02/25/20      OT LONG TERM GOAL #5   Title  Pt. will initiate left hand digit extension in order to be able to actively release an objects during ADL tasks.    Baseline  Pt. presents with consistent 2nd, and 5th digit extension.    Time  12    Period  Weeks    Status  On-going    Target Date  02/25/20      OT LONG TERM GOAL #6   Title  Pt. increase left hand digit flexion in order to be able to hold her toothbrush while applying toothpaste with her left right hand.    Baseline  Pt. is initiating activating gross digit flexors.    Time  12    Period  Weeks    Status  On-going    Target Date  02/25/20            Plan - 01/26/20 1608    Clinical Impression Statement  Pt.  continues to  excellent progress with LUE functioning, however presented with limited grass digit extension when releasing objects. Pt. was able to extend her digits. Pt. presented with increased flexor tone in the digits, and required increased weightbearing, and proprioception at the tabletop between grasping tasks. Pt.continues to workon shoulder stabilization exercises at home.Pt. continues to make steady progress overall.Pt.was able toactivate wrist extension, as well as2nd, 3rd, 4th, and 5thdigit extensiontoday with resistance with her forearm, and  hand positioned at  the tabletop.Pt.is able to consistently initiate active left shoulder horizontal abduction, elbow flexion, and extension, forearm supination, wrist extension, digit extension with facilitation, and thumb abduction, as well as active thumb IP extension.Pt.continues to work on improving her ability toactively, and functionally grasp, and release objects.Pt. continues torequirework on improvingshoulder stabilization, wrist, and digitmovements for hand to face patterns, and to increase LUE engagement during ADLs, and IADL tasks.    OT Occupational Profile and History  Detailed Assessment- Review of Records and additional review of physical, cognitive, psychosocial history related to current functional performance    Occupational performance deficits (Please refer to evaluation for details):  ADL's;IADL's    Rehab Potential  Good    Clinical Decision Making  Several treatment options, min-mod task modification necessary    Comorbidities Affecting Occupational Performance:  May have comorbidities impacting occupational performance    Modification or Assistance to Complete Evaluation   Min-Moderate modification of tasks or assist with assess necessary to complete eval    OT Frequency  2x / week    OT Duration  12 weeks    OT Treatment/Interventions  Self-care/ADL training;Moist Heat;DME and/or AE instruction;Neuromuscular education;Therapeutic exercise;Therapeutic activities;Energy conservation;Patient/family education    Consulted and Agree with Plan of Care  Patient       Patient will benefit from skilled therapeutic intervention in order to improve the following deficits and impairments:           Visit Diagnosis: Muscle weakness (generalized)  Other lack of coordination    Problem List Patient Active Problem List   Diagnosis Date Noted  . E. coli UTI   . Diabetes mellitus type 2 in obese ([place])   . Chronic pain of right knee   . Flaccid monoplegia of upper  extremity ([place])   . Hemiparesis affecting left side as late effect of stroke ([place])   . Acute ischemic right middle cerebral artery (MCA) stroke ([place]) 07/22/2019  . Carotid artery dissection  (HCC) s/p stent placement 07/21/2019  . Diabetes mellitus type II, uncontrolled ([place]) 07/21/2019  . Acute blood loss anemia 07/21/2019  . Leukocytosis 07/21/2019  . Stroke (cerebrum) ([place]) - R MCA infarct s/p tenecteplase and mechanical thrombectomy w/ d/t ICA dissection  07/15/2019  . Middle cerebral artery embolism, right 07/15/2019  . Controlled type 2 diabetes mellitus without complication, without long-term current use of insulin ([place]) 05/28/2017  . Attention deficit hyperactivity disorder (ADHD), combined type 04/18/2017  . Morbid obesity ([place]) 01/25/2016  . Umbilical hernia 65/99/3570  . Hiatal hernia 12/08/2015  . History of Clostridium difficile colitis 06/19/2014  . HTN (hypertension) 12/23/2013  . PCOS (polycystic ovarian syndrome) 12/23/2013  . Bariatric surgery status 01/28/2013    Harrel Carina, MS, OTR/L 01/26/2020, 4:11 PM  [place] MAIN [facility] SERVICES [address] [place], [place], [address] Phone: [phone]   Fax:  [phone]  Name: Kathryn Coffey MRN: [id number] Date of Birth: Jun 18, 1971

## 2020-01-28 ENCOUNTER — Encounter: Payer: No Typology Code available for payment source | Admitting: Occupational Therapy

## 2020-01-28 ENCOUNTER — Ambulatory Visit: Payer: No Typology Code available for payment source | Admitting: Occupational Therapy

## 2020-01-29 ENCOUNTER — Other Ambulatory Visit: Payer: Self-pay

## 2020-01-29 ENCOUNTER — Encounter (HOSPITAL_COMMUNITY): Payer: Self-pay | Admitting: Emergency Medicine

## 2020-01-29 ENCOUNTER — Emergency Department (HOSPITAL_COMMUNITY)
Admission: EM | Admit: 2020-01-29 | Discharge: 2020-01-29 | Disposition: A | Discharge status: Home / Self Care | Payer: No Typology Code available for payment source | Attending: Emergency Medicine | Admitting: Emergency Medicine

## 2020-01-29 DIAGNOSIS — Z5321 Procedure and treatment not carried out due to patient leaving prior to being seen by health care provider: Secondary | ICD-10-CM | POA: Insufficient documentation

## 2020-01-29 DIAGNOSIS — I10 Essential (primary) hypertension: Secondary | ICD-10-CM | POA: Diagnosis not present

## 2020-01-29 DIAGNOSIS — R519 Headache, unspecified: Secondary | ICD-10-CM | POA: Diagnosis not present

## 2020-01-29 LAB — CBC
HCT: 39 % (ref 36.0–46.0)
Hemoglobin: 11.7 g/dL — ABNORMAL LOW (ref 12.0–15.0)
MCH: 24.3 pg — ABNORMAL LOW (ref 26.0–34.0)
MCHC: 30 g/dL (ref 30.0–36.0)
MCV: 80.9 fL (ref 80.0–100.0)
Platelets: 484 10*3/uL — ABNORMAL HIGH (ref 150–400)
RBC: 4.82 MIL/uL (ref 3.87–5.11)
RDW: 16.9 % — ABNORMAL HIGH (ref 11.5–15.5)
WBC: 9.3 10*3/uL (ref 4.0–10.5)
nRBC: 0 % (ref 0.0–0.2)

## 2020-01-29 LAB — BASIC METABOLIC PANEL
Anion gap: 9 (ref 5–15)
BUN: 14 mg/dL (ref 6–20)
CO2: 26 mmol/L (ref 22–32)
Calcium: 9.4 mg/dL (ref 8.9–10.3)
Chloride: 103 mmol/L (ref 98–111)
Creatinine, Ser: 0.61 mg/dL (ref 0.44–1.00)
GFR calc Af Amer: >60 mL/min (ref >60)
GFR calc non Af Amer: >60 mL/min (ref >60)
Glucose, Bld: 149 mg/dL — ABNORMAL HIGH (ref 70–99)
Potassium: 3.5 mmol/L (ref 3.5–5.1)
Sodium: 138 mmol/L (ref 135–145)

## 2020-01-29 LAB — I-STAT BETA HCG BLOOD, ED (MC, WL, AP ONLY): I-stat hCG, quantitative: <5 m[IU]/mL (ref <5)

## 2020-01-29 MED ORDER — SODIUM CHLORIDE 0.9% FLUSH: 3.0000 mL | Freq: Once | INTRAVENOUS | Status: DC

## 2020-01-29 NOTE — ED Notes (Signed)
Called pts name 3x to be taken back to room with no response.

## 2020-01-29 NOTE — ED Triage Notes (Signed)
Pt c/o headache and increased BP, increased her BP medications per PCP. Hx stroke in January, residual deficits to the left side. No new deficits at this time. Pt also reports a covid exposure over the weekend.

## 2020-01-30 ENCOUNTER — Other Ambulatory Visit: Payer: Self-pay | Admitting: Internal Medicine

## 2020-02-02 ENCOUNTER — Ambulatory Visit: Payer: No Typology Code available for payment source

## 2020-02-02 ENCOUNTER — Ambulatory Visit: Payer: No Typology Code available for payment source | Admitting: Occupational Therapy

## 2020-02-02 ENCOUNTER — Encounter: Payer: Self-pay | Admitting: Occupational Therapy

## 2020-02-02 ENCOUNTER — Other Ambulatory Visit: Payer: Self-pay

## 2020-02-02 DIAGNOSIS — M6281 Muscle weakness (generalized): Secondary | ICD-10-CM | POA: Diagnosis not present

## 2020-02-02 DIAGNOSIS — R278 Other lack of coordination: Secondary | ICD-10-CM

## 2020-02-02 DIAGNOSIS — R262 Difficulty in walking, not elsewhere classified: Secondary | ICD-10-CM

## 2020-02-02 NOTE — Therapy (Signed)
[place] MAIN [facility] SERVICES [address] [place], [place], [address] Phone: [phone]   Fax:  [phone]  Physical Therapy Treatment  Patient Details  Name: Kathryn Coffey MRN: [id number] Date of Birth: [birth year] No data recorded  Encounter Date: 02/02/2020   PT End of Session - 02/02/20 1730    Visit Number 22    Number of Visits 73    Date for PT Re-Evaluation 04/13/20    PT Start Time 9672    PT Stop Time 1559    PT Time Calculation (min) 44 min    Equipment Utilized During Treatment Gait belt    Activity Tolerance Patient tolerated treatment well    Behavior During Therapy WFL for tasks assessed/performed           Past Medical History:  Diagnosis Date  . Abdominal wall cellulitis 2013 and 2014  . Asthma    allergic to grasses  . B12 deficiency   . Complication of anesthesia   . Costochondritis   . Diabetes mellitus without complication ([place])   . Gastric anomaly    multiple small ulcers  . GERD (gastroesophageal reflux disease)   . Herpes 06/2018   POSSIBLY IN EYE- PT TAKING VALTREX AND WILL SEE OPTHAMOLOGIST TO SEE IF VALTREX IS WORKING  . History of abnormal cervical Pap smear   . History of Clostridium difficile colitis   . History of hiatal hernia   . History of kidney stones   . Hypertension   . IBS (irritable bowel syndrome)   . Migraine   . Morbid obesity ([place])    s/p attempted gastric banding now decompressed  . PCOS (polycystic ovarian syndrome)   . PONV (postoperative nausea and vomiting)    WITH SPINAL ONLY  . Stroke [facility])     Past Surgical History:  Procedure Laterality Date  . BREAST EXCISIONAL BIOPSY Left    cyst excision - Dr. Patty Sermons  . CESAREAN SECTION    . CHOLECYSTECTOMY    . COLONOSCOPY    . EAR CYST EXCISION Right 07/04/2018   Procedure: EXCISION GLOMUS TUMOR THUMBNAIL;  Surgeon: Corky Mull, MD;  Location: [facility] ORS;  Service: Orthopedics;  Laterality: Right;  .  ESOPHAGOGASTRODUODENOSCOPY    . ESOPHAGOGASTRODUODENOSCOPY (EGD) WITH PROPOFOL N/A 12/06/2015   Procedure: ESOPHAGOGASTRODUODENOSCOPY (EGD) WITH PROPOFOL;  Surgeon: Manya Silvas, MD;  Location: [facility] ENDOSCOPY;  Service: Endoscopy;  Laterality: N/A;  . IR ANGIO INTRA EXTRACRAN SEL INTERNAL CAROTID UNI L MOD SED  07/15/2019  . IR ANGIO VERTEBRAL SEL SUBCLAVIAN INNOMINATE UNI R MOD SED  07/15/2019  . IR CT HEAD LTD  07/15/2019  . IR INTRAVSC STENT CERV CAROTID W/O EMB-PROT MOD SED INC ANGIO  07/15/2019  . IR PERCUTANEOUS ART THROMBECTOMY/INFUSION INTRACRANIAL INC DIAG ANGIO  07/15/2019  . LAPAROSCOPIC GASTRIC BANDING    . LAPAROSCOPIC GASTRIC RESTRICTIVE DUODENAL PROCEDURE (DUODENAL SWITCH) N/A 01/25/2016   Procedure: LAPAROSCOPIC GASTRIC RESTRICTIVE DUODENAL PROCEDURE (DUODENAL SWITCH);  Surgeon: Ladora Daniel, MD;  Location: [facility] ORS;  Service: General;  Laterality: N/A;  . OVARY SURGERY     x2  . RADIOLOGY WITH ANESTHESIA N/A 07/15/2019   Procedure: IR WITH ANESTHESIA;  Surgeon: Luanne Bras, MD;  Location: [place];  Service: Radiology;  Laterality: N/A;  . TONSILLECTOMY    . TUBAL LIGATION    . UMBILICAL HERNIA REPAIR N/A 01/25/2016   Procedure: LAPAROSCOPIC UMBILICAL HERNIA;  Surgeon: Ladora Daniel, MD;  Location: [facility] ORS;  Service: General;  Laterality: N/A;  . UMBILICAL HERNIA REPAIR N/A 10/20/2016   Procedure: HERNIA REPAIR UMBILICAL ADULT;  Surgeon: Jarvis Wilton Smith, MD;  Location: [facility] ORS;  Service: General;  Laterality: N/A;    There were no vitals filed for this visit.   Subjective Assessment - 02/02/20 1520    Subjective Patient had an increase of diastolic pressure to 108 and was taken to the ER due to having headache. No falls or LOB since last session. Doctor changed her medication to her pressure, was changed from zolaft to lexipro.    Pertinent History Patient was in the hospital [place] and then in patient rehab 3 weeks. She was dicharged 08/13/19 and then HHPT  and was discharged 09/03/19. She has not had any falls.    Currently in Pain? No/denies    Pain Onset In the past 7 days           Vitals: 145/88    TREATMENT   Ther-ex  NuStep L2-4 x 4 minutes for cardiovascular challenge Precor single leg press LLE: 45# x 15,x 10x 8  Forward and L lateral BOSU lunges (round side up) x 10 each; Lateral squat walk 4x length of // bars Modified single limb stance: RLE on dynadisc 60 seconds x 2 trials RTB around ankles: backwards ambulation with focus on glute activation x 6 lengths of // bars  Focused heel strike for LLE for improved gait mechanics x 12; then incorporated into ambulation x 96 ft; Seated toe tap to alternating sticky notes for coordination, df/pf challenge x 20  Pt educated throughout session about proper posture and technique with exercises. Improved exercise technique, movement at target joints, use of target muscles after min to mod verbal, visual, tactile cues.                           PT Education - 02/02/20 1730    Education Details exercise technique body mechanics    Person(s) Educated Patient    Methods Explanation;Demonstration;Tactile cues;Verbal cues    Comprehension Verbalized understanding;Returned demonstration;Verbal cues required;Tactile cues required            PT Short Term Goals - 01/20/20 1638      PT SHORT TERM GOAL #1   Title Patient will be independent in home exercise program to improve strength/mobility for better functional independence with ADLs.    Baseline 11/03/19: Pt performing HEP at least 5d/wk    Time 6    Period Weeks    Status Achieved    Target Date --      PT SHORT TERM GOAL #2   Title Patient (> 60 years old) will complete five times sit to stand test in < 15 seconds indicating an increased LE strength and improved balance.    Baseline eval: 15.28s; 11/03/19: 12.2s; 01/20/20: 9.6s    Time 6    Period Weeks    Status Achieved    Target Date --              PT Long Term Goals - 01/20/20 1639      PT LONG TERM GOAL #1   Title Patient will increase Berg Balance score by > 6 points to demonstrate decreased fall risk during functional activities.    Baseline 09/08/19: 48/56; 11/03/19: 51/56; 01/20/20: 54/56    Time 12    Period Weeks    Status Partially Met    Target Date 04/13/20      PT LONG   TERM GOAL #2   Title Patient will increase six minute walk test distance to >1200 for progression to community ambulator and improve gait ability    Baseline 09/08/19: 525', 11/03/19: 062'; 01/20/20: 995';    Time 12    Period Weeks    Status Partially Met    Target Date 04/13/20      PT LONG TERM GOAL #3   Title Patient will increase 10 meter walk test to >1.23ms as to improve gait speed for better community ambulation and to reduce fall risk    Baseline 09/08/19:  0.51 m/s; 11/03/19: self-selected: 13.2s = 0.76 m/s, fastest: 12.5s = 0.8 m/s; 01/20/20: self-selected: 13.4s =  0.75 m/s, fastest: 10.7s = 0.93 m/s    Time 12    Period Weeks    Status Partially Met    Target Date 03/16/20                 Plan - 02/02/20 1731    Clinical Impression Statement Patient is very motivated throughout physical therapy session. Continued progression of strengthening and stability with increasing control of affected limb performed. Pt encouraged to continue HEP and follow-up as scheduled. She will benefit from PT services to address deficits in strength, balance, and mobility in order to return to full function at home.    Personal Factors and Comorbidities Comorbidity 1    Comorbidities diabetes, high blood pressure, reflux,    Examination-Activity Limitations Carry;Caring for Others;Bend;Locomotion Level;Self Feeding;Sleep;Toileting    Examination-Participation Restrictions Cleaning;Community Activity;Driving;Laundry;Shop    Stability/Clinical Decision Making Stable/Uncomplicated    Rehab Potential Good    PT Frequency 2x / week    PT Duration 12 weeks    PT  Treatment/Interventions Manual techniques;Functional mobility training;Stair training;Gait training;Electrical Stimulation;Moist Heat;Ultrasound;Therapeutic exercise;Balance training;Neuromuscular re-education    PT Next Visit Plan strengthening and balance    PT Home Exercise Plan standing hip abd,    Consulted and Agree with Plan of Care Patient           Patient will benefit from skilled therapeutic intervention in order to improve the following deficits and impairments:  Abnormal gait, Decreased knowledge of use of DME, Dizziness, Pain, Decreased coordination, Impaired UE functional use, Decreased strength, Decreased endurance, Decreased activity tolerance, Decreased balance, Difficulty walking, Obesity  Visit Diagnosis: Muscle weakness (generalized)  Difficulty in walking, not elsewhere classified  Other lack of coordination     Problem List Patient Active Problem List   Diagnosis Date Noted  . E. coli UTI   . Diabetes mellitus type 2 in obese ([place]   . Chronic pain of right knee   . Flaccid monoplegia of upper extremity ([place]   . Hemiparesis affecting left side as late effect of stroke ([place]   . Acute ischemic right middle cerebral artery (MCA) stroke ([place] 07/22/2019  . Carotid artery dissection  (HCC) s/p stent placement 07/21/2019  . Diabetes mellitus type II, uncontrolled ([place] 07/21/2019  . Acute blood loss anemia 07/21/2019  . Leukocytosis 07/21/2019  . Stroke (cerebrum) ([place] - R MCA infarct s/p tenecteplase and mechanical thrombectomy w/ d/t ICA dissection  07/15/2019  . Middle cerebral artery embolism, right 07/15/2019  . Controlled type 2 diabetes mellitus without complication, without long-term current use of insulin ([place] 05/28/2017  . Attention deficit hyperactivity disorder (ADHD), combined type 04/18/2017  . Morbid obesity ([place] 01/25/2016  . Umbilical hernia 069/48/5462 . Hiatal hernia 12/08/2015  . History of Clostridium difficile colitis 06/19/2014  .  HTN (hypertension) 12/23/2013  .  PCOS (polycystic ovarian syndrome) 12/23/2013  . Bariatric surgery status 01/28/2013   Marina Moser, PT, DPT   02/02/2020, 5:32 PM  [place] [facility] MAIN REHAB SERVICES [address] [place], , [address] Phone: [phone]   Fax:  [phone]  Name: Anaisabel B Duignan MRN: [id number] Date of Birth: 12/02/1970   

## 2020-02-04 ENCOUNTER — Ambulatory Visit: Payer: No Typology Code available for payment source

## 2020-02-04 ENCOUNTER — Ambulatory Visit: Payer: No Typology Code available for payment source | Admitting: Occupational Therapy

## 2020-02-05 NOTE — Therapy (Signed)
[place] MAIN [facility] SERVICES [address] [place], [place], [address] Phone: [phone]   Fax:  [phone]  Occupational Therapy Treatment/Progress Update Reporting period from 12/10/2019 to 02/02/2020  Patient Details  Name: Kathryn Coffey MRN: [id number] Date of Birth: 26-May-1971 Referring Provider (OT): Raulkar   Encounter Date: 02/02/2020   OT End of Session - 02/05/20 1313    Visit Number 30    Number of Visits 52    Date for OT Re-Evaluation 02/25/20    Authorization Type Progress report period starting  on 02/02/2020    Authorization Time Period FOTO    OT Start Time 1600    OT Stop Time 1645    OT Time Calculation (min) 45 min    Activity Tolerance Patient tolerated treatment well    Behavior During Therapy [facility] for tasks assessed/performed           Past Medical History:  Diagnosis Date  . Abdominal wall cellulitis 2013 and 2014  . Asthma    allergic to grasses  . B12 deficiency   . Complication of anesthesia   . Costochondritis   . Diabetes mellitus without complication ([place])   . Gastric anomaly    multiple small ulcers  . GERD (gastroesophageal reflux disease)   . Herpes 06/2018   POSSIBLY IN EYE- PT TAKING VALTREX AND WILL SEE OPTHAMOLOGIST TO SEE IF VALTREX IS WORKING  . History of abnormal cervical Pap smear   . History of Clostridium difficile colitis   . History of hiatal hernia   . History of kidney stones   . Hypertension   . IBS (irritable bowel syndrome)   . Migraine   . Morbid obesity ([place])    s/p attempted gastric banding now decompressed  . PCOS (polycystic ovarian syndrome)   . PONV (postoperative nausea and vomiting)    WITH SPINAL ONLY  . Stroke [facility])     Past Surgical History:  Procedure Laterality Date  . BREAST EXCISIONAL BIOPSY Left    cyst excision - Dr. Patty Sermons  . CESAREAN SECTION    . CHOLECYSTECTOMY    . COLONOSCOPY    . EAR CYST EXCISION Right 07/04/2018   Procedure:  EXCISION GLOMUS TUMOR THUMBNAIL;  Surgeon: Corky Mull, MD;  Location: [facility] ORS;  Service: Orthopedics;  Laterality: Right;  . ESOPHAGOGASTRODUODENOSCOPY    . ESOPHAGOGASTRODUODENOSCOPY (EGD) WITH PROPOFOL N/A 12/06/2015   Procedure: ESOPHAGOGASTRODUODENOSCOPY (EGD) WITH PROPOFOL;  Surgeon: Manya Silvas, MD;  Location: [facility] ENDOSCOPY;  Service: Endoscopy;  Laterality: N/A;  . IR ANGIO INTRA EXTRACRAN SEL INTERNAL CAROTID UNI L MOD SED  07/15/2019  . IR ANGIO VERTEBRAL SEL SUBCLAVIAN INNOMINATE UNI R MOD SED  07/15/2019  . IR CT HEAD LTD  07/15/2019  . IR INTRAVSC STENT CERV CAROTID W/O EMB-PROT MOD SED INC ANGIO  07/15/2019  . IR PERCUTANEOUS ART THROMBECTOMY/INFUSION INTRACRANIAL INC DIAG ANGIO  07/15/2019  . LAPAROSCOPIC GASTRIC BANDING    . LAPAROSCOPIC GASTRIC RESTRICTIVE DUODENAL PROCEDURE (DUODENAL SWITCH) N/A 01/25/2016   Procedure: LAPAROSCOPIC GASTRIC RESTRICTIVE DUODENAL PROCEDURE (DUODENAL SWITCH);  Surgeon: Ladora Daniel, MD;  Location: [facility] ORS;  Service: General;  Laterality: N/A;  . OVARY SURGERY     x2  . RADIOLOGY WITH ANESTHESIA N/A 07/15/2019   Procedure: IR WITH ANESTHESIA;  Surgeon: Luanne Bras, MD;  Location: [place];  Service: Radiology;  Laterality: N/A;  . TONSILLECTOMY    . TUBAL LIGATION    . UMBILICAL HERNIA REPAIR N/A 01/25/2016  Procedure: LAPAROSCOPIC UMBILICAL HERNIA;  Surgeon: Ladora Daniel, MD;  Location: [facility] ORS;  Service: General;  Laterality: N/A;  . UMBILICAL HERNIA REPAIR N/A 10/20/2016   Procedure: HERNIA REPAIR UMBILICAL ADULT;  Surgeon: Leonie Green, MD;  Location: [facility] ORS;  Service: General;  Laterality: N/A;    There were no vitals filed for this visit.   Subjective Assessment - 02/04/20 2138    Subjective  Patient reports she has been going to the pool lately and working on exercises in the pool.  Feels the fingers are not cooperating with exercises, was able to carry her banana to her office without dropping it.  Stabilizing  items with hand for makeup.  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    Pertinent History Per pt. chart. Pt. is a 49 y.o. female with history of HTN, T2DM, obesity who was admitted on 07/15/19 with onset of HA progressing to flaccid left hemiparesis a few hours later.  CT head showed acute nonhemorrhagic infarct in right basal ganglia with hyperdense right MCA sign.  CT A/P head neck showed perfusion deficit with occluded right ICA and slow flow in M1.  She underwent cerebral angiogram with revascularization of right MCA with T1C1 revascularization and repair of right ICA with flow diverter device. Stroke felt to be embolic likely due to ICA dissection. Postprocedure head CT was negative for bleed repeat CTA head neck showed reocclusion of right ICA origin and occlusion of right ICA stent but now with patent right MCA. Pt. received inpatient rehab services for 3 weeks, and home health services.    Patient Stated Goals Patient would like to be as independent as possible, return to her prior level of function    Currently in Pain? No/denies    Pain Score 0-No pain          Therapeutic Exercises:  Patient seen for A/AAROM to LUE to promote normal movement patterns for shoulder flexion, ABD, forward reach, elbow flexion/extension, supination/pronation, wrist flex/ext, digit flexion/ext.  Wrist extension to neutral with place and hold for 3 secs for multiple trials.  Patient demonstrating active finger extension, increased motion at the index finger this date.  Thumb ROM for thumb flexion/ext, radial and palmar ABD with therapist moving through the motions followed by facilitation of movement.    Discussed exercises in the pool and potential use of pool noodles and/or kickboard to assist with ROM and movement patterns.    Response to tx: Patient is very self motivated to perform exercises as well as problem solving ways to modify tasks as needed to be as  independent as possible.  She has excellent support with her spouse, they are planning to go on a short beach trip this week and her spouse will help her with her exercises daily while they are away.  Patient has been going to her neighborhood pool, we discussed some ways to also use items such as a pool noodle or kickboard for UE exercises to improve ROM.Patient continues to progress with improved range of motion, strength and coordination skills.  Patient requires occasional cues for proper form and technique during exercises for home program.Patient continues to benefit from skilled occupational therapy services to maximize safety and independence in necessary daily activities at home and in the community.                      OT Long Term Goals - 02/04/20  [place] #1   Title Pt. will increase left shoulder flexion PROM by 20 degrees to assist with UE dressing    Baseline Shoulder flexion sitting 38 degrees, supine: 18(135).Pt. continues to present with limited left shoulder PROM    Time 12    Period Weeks    Status On-going    Target Date 02/25/20      OT LONG TERM GOAL #2   Title Pt. will be able to actively stabilize left shoulder for 10 seconds in supine with shoulder flexed to 90 degrees, and elbow in extension in preparation for functional reaching.    Baseline Pt. was able to initate shoulder stabilization with her shoulder flexed to 90 degrees, and her elbow extended for 5 sec. each for 3  reps. Pt. continues to activate intermittent muscle responses.    Time 12    Period Weeks    Status On-going    Target Date 02/25/20      OT LONG TERM GOAL #3   Title Pt. will independently perform hand to face patterns with the LUE in preparation for light self grooming tasks.    Baseline Pt. is able to initiate performing hand to face patterns, and is able to reach her face, however is unable to sustain, and use her hand at her face.    Time 12    Period  Weeks    Status On-going    Target Date 02/25/20      OT LONG TERM GOAL #4   Title Pt. will initiate 10 degrees of active left wrist extension  in preparation for actively lifting her hand off of a surface.    Baseline Wrist extension: 5(60)    Time 12    Period Weeks    Status On-going    Target Date 02/25/20      OT LONG TERM GOAL #5   Title Pt. will initiate left hand digit extension in order to be able to actively release an objects during ADL tasks.    Baseline Pt. presents with consistent 2nd, and 5th digit extension.    Time 12    Period Weeks    Status On-going    Target Date 02/25/20      OT LONG TERM GOAL #6   Title Pt. increase left hand digit flexion in order to be able to hold her toothbrush while applying toothpaste with her left right hand.    Baseline Pt. is initiating activating gross digit flexors.    Time 12    Period Weeks    Status On-going    Target Date 02/25/20                 Plan - 02/05/20 2150    Clinical Impression Statement Patient is very self motivated to perform exercises as well as problem solving ways to modify tasks as needed to be as independent as possible.  She has excellent support with her spouse, they are planning to go on a short beach trip this week and her spouse will help her with her exercises daily while they are away.  Patient has been going to her neighborhood pool, we discussed some ways to also use items such as a pool noodle or kickboard for UE exercises to improve ROM.Patient continues to progress with improved range of motion, strength and coordination skills.  Patient requires occasional cues for proper form and technique during exercises for home program.Patient continues to benefit from skilled  occupational therapy services to maximize safety and independence in necessary daily activities at home and in the community.    OT Occupational Profile and History Detailed Assessment- Review of Records and additional review of  physical, cognitive, psychosocial history related to current functional performance    Occupational performance deficits (Please refer to evaluation for details): ADL's;IADL's    Rehab Potential Good    Clinical Decision Making Several treatment options, min-mod task modification necessary    Comorbidities Affecting Occupational Performance: May have comorbidities impacting occupational performance    Modification or Assistance to Complete Evaluation  Min-Moderate modification of tasks or assist with assess necessary to complete eval    OT Frequency 2x / week    OT Duration 12 weeks    OT Treatment/Interventions Self-care/ADL training;Moist Heat;DME and/or AE instruction;Neuromuscular education;Therapeutic exercise;Therapeutic activities;Energy conservation;Patient/family education    Consulted and Agree with Plan of Care Patient           Patient will benefit from skilled therapeutic intervention in order to improve the following deficits and impairments:           Visit Diagnosis: Muscle weakness (generalized)  Other lack of coordination    Problem List Patient Active Problem List   Diagnosis Date Noted  . E. coli UTI   . Diabetes mellitus type 2 in obese ([place])   . Chronic pain of right knee   . 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 Hemiparesis affecting left side as late effect of stroke ([place])   . Acute ischemic right middle cerebral artery (MCA) stroke ([place]) 07/22/2019  . Carotid artery dissection  (HCC) s/p stent placement 07/21/2019  . Diabetes mellitus type II, uncontrolled ([place]) 07/21/2019  . Acute blood loss anemia 07/21/2019  . Leukocytosis 07/21/2019  . Stroke (cerebrum) ([place]) - R MCA infarct s/p tenecteplase and mechanical thrombectomy w/ d/t ICA dissection  07/15/2019  . Middle cerebral artery embolism, right 07/15/2019  . Controlled type 2 diabetes mellitus without complication, without long-term current use of insulin ([place]) 05/28/2017  . Attention  deficit hyperactivity disorder (ADHD), combined type 04/18/2017  . Morbid obesity ([place]) 01/25/2016  . Umbilical hernia 50/93/2671  . Hiatal hernia 12/08/2015  . History of Clostridium difficile colitis 06/19/2014  . HTN (hypertension) 12/23/2013  . PCOS (polycystic ovarian syndrome) 12/23/2013  . Bariatric surgery status 01/28/2013  Achilles Dunk, OTR/L, CLT  Rahmah Mccamy 02/05/2020, 9:50 PM  [place] MAIN [facility] SERVICES [address] [place], [place], [address] Phone: [phone]   Fax:  [phone]  Name: ANNAELLE KASEL MRN: [id number] Date of Birth: [birth year]

## 2020-02-09 ENCOUNTER — Ambulatory Visit: Payer: No Typology Code available for payment source

## 2020-02-09 ENCOUNTER — Ambulatory Visit: Payer: No Typology Code available for payment source | Admitting: Occupational Therapy

## 2020-02-09 ENCOUNTER — Other Ambulatory Visit: Payer: Self-pay

## 2020-02-09 DIAGNOSIS — M6281 Muscle weakness (generalized): Secondary | ICD-10-CM

## 2020-02-09 DIAGNOSIS — I63511 Cerebral infarction due to unspecified occlusion or stenosis of right middle cerebral artery: Secondary | ICD-10-CM

## 2020-02-09 DIAGNOSIS — R278 Other lack of coordination: Secondary | ICD-10-CM

## 2020-02-09 DIAGNOSIS — R2689 Other abnormalities of gait and mobility: Secondary | ICD-10-CM

## 2020-02-09 DIAGNOSIS — R262 Difficulty in walking, not elsewhere classified: Secondary | ICD-10-CM

## 2020-02-09 NOTE — Therapy (Signed)
[place] MAIN [facility] SERVICES [address] [place], [place], [address] Phone: [phone]   Fax:  [phone]  Physical Therapy Treatment  Patient Details  Name: ANJALI MANZELLA MRN: [id number] Date of Birth: 27-Oct-1970 No data recorded  Encounter Date: 02/09/2020   PT End of Session - 02/09/20 1400    Visit Number 23    Number of Visits 73    Date for PT Re-Evaluation 04/13/20    PT Start Time 1350    PT Stop Time 1430    PT Time Calculation (min) 40 min    Equipment Utilized During Treatment Gait belt    Activity Tolerance Patient tolerated treatment well;No increased pain    Behavior During Therapy WFL for tasks assessed/performed           Past Medical History:  Diagnosis Date  . Abdominal wall cellulitis 2013 and 2014  . Asthma    allergic to grasses  . B12 deficiency   . Complication of anesthesia   . Costochondritis   . Diabetes mellitus without complication ([place])   . Gastric anomaly    multiple small ulcers  . GERD (gastroesophageal reflux disease)   . Herpes 06/2018   POSSIBLY IN EYE- PT TAKING VALTREX AND WILL SEE OPTHAMOLOGIST TO SEE IF VALTREX IS WORKING  . History of abnormal cervical Pap smear   . History of Clostridium difficile colitis   . History of hiatal hernia   . History of kidney stones   . Hypertension   . IBS (irritable bowel syndrome)   . Migraine   . Morbid obesity ([place])    s/p attempted gastric banding now decompressed  . PCOS (polycystic ovarian syndrome)   . PONV (postoperative nausea and vomiting)    WITH SPINAL ONLY  . Stroke [facility])     Past Surgical History:  Procedure Laterality Date  . BREAST EXCISIONAL BIOPSY Left    cyst excision - Dr. Patty Sermons  . CESAREAN SECTION    . CHOLECYSTECTOMY    . COLONOSCOPY    . EAR CYST EXCISION Right 07/04/2018   Procedure: EXCISION GLOMUS TUMOR THUMBNAIL;  Surgeon: Corky Mull, MD;  Location: [facility] ORS;  Service: Orthopedics;  Laterality: Right;   . ESOPHAGOGASTRODUODENOSCOPY    . ESOPHAGOGASTRODUODENOSCOPY (EGD) WITH PROPOFOL N/A 12/06/2015   Procedure: ESOPHAGOGASTRODUODENOSCOPY (EGD) WITH PROPOFOL;  Surgeon: Manya Silvas, MD;  Location: [facility] ENDOSCOPY;  Service: Endoscopy;  Laterality: N/A;  . IR ANGIO INTRA EXTRACRAN SEL INTERNAL CAROTID UNI L MOD SED  07/15/2019  . IR ANGIO VERTEBRAL SEL SUBCLAVIAN INNOMINATE UNI R MOD SED  07/15/2019  . IR CT HEAD LTD  07/15/2019  . IR INTRAVSC STENT CERV CAROTID W/O EMB-PROT MOD SED INC ANGIO  07/15/2019  . IR PERCUTANEOUS ART THROMBECTOMY/INFUSION INTRACRANIAL INC DIAG ANGIO  07/15/2019  . LAPAROSCOPIC GASTRIC BANDING    . LAPAROSCOPIC GASTRIC RESTRICTIVE DUODENAL PROCEDURE (DUODENAL SWITCH) N/A 01/25/2016   Procedure: LAPAROSCOPIC GASTRIC RESTRICTIVE DUODENAL PROCEDURE (DUODENAL SWITCH);  Surgeon: Ladora Daniel, MD;  Location: [facility] ORS;  Service: General;  Laterality: N/A;  . OVARY SURGERY     x2  . RADIOLOGY WITH ANESTHESIA N/A 07/15/2019   Procedure: IR WITH ANESTHESIA;  Surgeon: Luanne Bras, MD;  Location: [place];  Service: Radiology;  Laterality: N/A;  . TONSILLECTOMY    . TUBAL LIGATION    . UMBILICAL HERNIA REPAIR N/A 01/25/2016   Procedure: LAPAROSCOPIC UMBILICAL HERNIA;  Surgeon: Ladora Daniel, MD;  Location: [facility] ORS;  Service:  General;  Laterality: N/A;  . UMBILICAL HERNIA REPAIR N/A 10/20/2016   Procedure: HERNIA REPAIR UMBILICAL ADULT;  Surgeon: Leonie Green, MD;  Location: [facility] ORS;  Service: General;  Laterality: N/A;    There were no vitals filed for this visit.   Subjective Assessment - 02/09/20 1353    Subjective Pt back from her trip to [place] this weekend, was compliant with HEP whilst there.    Pertinent History Patient was in the hospital [place] and then in patient rehab 3 weeks. She was dicharged 08/13/19 and then HHPT and was discharged 09/03/19. She has not had any falls.    Currently in Pain? Yes    Pain Score 3     Pain Location --   Left  shoulder pain         INTERVENTION THIS DATE: -Nustep AA/ROM, RUE 10, LUE 8, seat 8; level 2, advanced to 5 minutes (LUE fixed to handle with holster, arm length modified to minimize Left shoulder pain)  -seated L HS curls with RedTB 2x15; -seated Lt LAQ 1x15; against gravity; 2x10 @ 5lb -seated L DF 1x15 against gravity; 1x20 c 3lbAW -Precor single leg press LLE: 45# 2x15 -LLE seated marching 2x15 c 3lb AW   -Lateral squat walk 4x length of // bars, RUE support -Standing step taps 1x15 8" high; 1x15 c rock on 8" step     PT Short Term Goals - 01/20/20 1638      PT SHORT TERM GOAL #1   Title Patient will be independent in home exercise program to improve strength/mobility for better functional independence with ADLs.    Baseline 11/03/19: Pt performing HEP at least 5d/wk    Time 6    Period Weeks    Status Achieved    Target Date --      PT SHORT TERM GOAL #2   Title Patient (> 48 years old) will complete five times sit to stand test in < 15 seconds indicating an increased LE strength and improved balance.    Baseline eval: 15.28s; 11/03/19: 12.2s; 01/20/20: 9.6s    Time 6    Period Weeks    Status Achieved    Target Date --             PT Long Term Goals - 01/20/20 1639      PT LONG TERM GOAL #1   Title Patient will increase Berg Balance score by > 6 points to demonstrate decreased fall risk during functional activities.    Baseline 09/08/19: 48/56; 11/03/19: 51/56; 01/20/20: 54/56    Time 12    Period Weeks    Status Partially Met    Target Date 04/13/20      PT LONG TERM GOAL #2   Title Patient will increase six minute walk test distance to >1200 for progression to community ambulator and improve gait ability    Baseline 09/08/19: 525', 11/03/19: 672'; 01/20/20: 995';    Time 12    Period Weeks    Status Partially Met    Target Date 04/13/20      PT LONG TERM GOAL #3   Title Patient will increase 10 meter walk test to >1.28ms as to improve gait speed for better  community ambulation and to reduce fall risk    Baseline 09/08/19:  0.51 m/s; 11/03/19: self-selected: 13.2s = 0.76 m/s, fastest: 12.5s = 0.8 m/s; 01/20/20: self-selected: 13.4s =  0.75 m/s, fastest: 10.7s = 0.93 m/s    Time 12  Period Weeks    Status Partially Met    Target Date 03/16/20               Plan - 02/09/20 1402    Clinical Impression Statement Pt able to complete entire session as planned with rest breaks provided as needed. Pt maintains high level of focus and motivation. Extensive verbal, visual, and tactile cues are provided for most accurate form possible. Progress resistance level for most exercises, except for leg press which remains unchanged.  Author provides minA intermittently for full ROM when needed. In gait pt moves about without much issue of unsteadiness. Pt has increased Left shoulder pain, but is able to modifiy nustep position for pain control. Overall pt continues to make steady progress toward treatment goals.    Personal Factors and Comorbidities Comorbidity 1    Comorbidities diabetes, high blood pressure, reflux,    Examination-Activity Limitations Carry;Caring for Others;Bend;Locomotion Level;Self Feeding;Sleep;Toileting    Examination-Participation Restrictions Cleaning;Community Activity;Driving;Laundry;Shop    Stability/Clinical Decision Making Stable/Uncomplicated    Clinical Decision Making Low    Rehab Potential Good    PT Frequency 2x / week    PT Duration 12 weeks    PT Treatment/Interventions Manual techniques;Functional mobility training;Stair training;Gait training;Electrical Stimulation;Moist Heat;Ultrasound;Therapeutic exercise;Balance training;Neuromuscular re-education    PT Next Visit Plan strengthening and balance    PT Home Exercise Plan standing hip abd,    Consulted and Agree with Plan of Care Patient           Patient will benefit from skilled therapeutic intervention in order to improve the following deficits and impairments:   Abnormal gait, Decreased knowledge of use of DME, Dizziness, Pain, Decreased coordination, Impaired UE functional use, Decreased strength, Decreased endurance, Decreased activity tolerance, Decreased balance, Difficulty walking, Obesity  Visit Diagnosis: Muscle weakness (generalized)  Other lack of coordination  Difficulty in walking, not elsewhere classified  Acute ischemic right middle cerebral artery (MCA) stroke (HCC)  Other abnormalities of gait and mobility     Problem List Patient Active Problem List   Diagnosis Date Noted  . E. coli UTI   . Diabetes mellitus type 2 in obese ([place])   . Chronic pain of right knee   . 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 Hemiparesis affecting left side as late effect of stroke ([place])   . Acute ischemic right middle cerebral artery (MCA) stroke ([place]) 07/22/2019  . Carotid artery dissection  (HCC) s/p stent placement 07/21/2019  . Diabetes mellitus type II, uncontrolled ([place]) 07/21/2019  . Acute blood loss anemia 07/21/2019  . Leukocytosis 07/21/2019  . Stroke (cerebrum) ([place]) - R MCA infarct s/p tenecteplase and mechanical thrombectomy w/ d/t ICA dissection  07/15/2019  . Middle cerebral artery embolism, right 07/15/2019  . Controlled type 2 diabetes mellitus without complication, without long-term current use of insulin ([place]) 05/28/2017  . Attention deficit hyperactivity disorder (ADHD), combined type 04/18/2017  . Morbid obesity ([place]) 01/25/2016  . Umbilical hernia 38/17/7116  . Hiatal hernia 12/08/2015  . History of Clostridium difficile colitis 06/19/2014  . HTN (hypertension) 12/23/2013  . PCOS (polycystic ovarian syndrome) 12/23/2013  . Bariatric surgery status 01/28/2013    Kassim Guertin C 02/09/2020, 2:23 PM  [place] MAIN [facility] SERVICES [address] [place], [place], [address] Phone: [phone]   Fax:  [phone]  Name: MAIZEY MENENDEZ MRN: [id number] Date of Birth:  Apr 16, 1971

## 2020-02-10 ENCOUNTER — Other Ambulatory Visit: Payer: Self-pay

## 2020-02-11 ENCOUNTER — Encounter: Payer: Self-pay | Admitting: Occupational Therapy

## 2020-02-11 ENCOUNTER — Encounter: Payer: Self-pay | Admitting: Physical Medicine and Rehabilitation

## 2020-02-11 ENCOUNTER — Ambulatory Visit: Payer: No Typology Code available for payment source | Admitting: Occupational Therapy

## 2020-02-11 ENCOUNTER — Other Ambulatory Visit: Payer: Self-pay

## 2020-02-11 ENCOUNTER — Encounter
Payer: No Typology Code available for payment source | Attending: Physical Medicine and Rehabilitation | Admitting: Physical Medicine and Rehabilitation

## 2020-02-11 VITALS — BP 115/78 | HR 86 | Temp 97.5°F | Ht 65.0 in | Wt 229.0 lb

## 2020-02-11 DIAGNOSIS — M7522 Bicipital tendinitis, left shoulder: Secondary | ICD-10-CM

## 2020-02-11 DIAGNOSIS — M67912 Unspecified disorder of synovium and tendon, left shoulder: Secondary | ICD-10-CM

## 2020-02-11 MED ORDER — MIRABEGRON ER 25 MG PO TB24: 25.0000 mg | ORAL_TABLET | Freq: Every day | ORAL | 0 refills | Status: DC

## 2020-02-11 NOTE — Progress Notes (Signed)
Shoulder injection, left, with ultrasound guidance.   Indication: Left shoulder pain not relieved by medication management and other conservative care.  Informed consent was obtained after describing risks and benefits of the procedure with the patient, this includes bleeding, bruising, infection and medication side effects. The patient wishes to proceed and has given written consent. Patient was placed in a seated position.   The left shoulder was marked and prepped with betadine in the subacromial area. A 25-gauge 1-1/2 inch needle was inserted into the subacromial area. After negative draw back for blood, a solution containing 1 mL of 6 mg per ML betamethasone and 3 mL of 1% lidocaine was injected. A band aid was applied. The patient tolerated the procedure well. Post procedure instructions were given.

## 2020-02-11 NOTE — Therapy (Signed)
[place] [facility] SERVICES [address] [place], [place], [address] Phone: [phone]   Fax:  [phone]  Occupational Therapy Treatment  Patient Details  Name: Kathryn Coffey MRN: [id number] Date of Birth: 03/18/1971 Referring Provider (OT): Raulkar   Encounter Date: 02/09/2020   OT End of Session - 02/11/20 0835    Visit Number 31    Number of Visits 67    Date for OT Re-Evaluation 02/25/20    Authorization Type Progress report period starting  on 02/02/2020    Authorization Time Period FOTO    OT Start Time 1315    OT Stop Time 1345    OT Time Calculation (min) 30 min    Activity Tolerance Patient tolerated treatment well    Behavior During Therapy [facility] for tasks assessed/performed           Past Medical History:  Diagnosis Date  . Abdominal wall cellulitis 2013 and 2014  . Asthma    allergic to grasses  . B12 deficiency   . Complication of anesthesia   . Costochondritis   . Diabetes mellitus without complication ([place])   . Gastric anomaly    multiple small ulcers  . GERD (gastroesophageal reflux disease)   . Herpes 06/2018   POSSIBLY IN EYE- PT TAKING VALTREX AND WILL SEE OPTHAMOLOGIST TO SEE IF VALTREX IS WORKING  . History of abnormal cervical Pap smear   . History of Clostridium difficile colitis   . History of hiatal hernia   . History of kidney stones   . Hypertension   . IBS (irritable bowel syndrome)   . Migraine   . Morbid obesity ([place])    s/p attempted gastric banding now decompressed  . PCOS (polycystic ovarian syndrome)   . PONV (postoperative nausea and vomiting)    WITH SPINAL ONLY  . Stroke [facility])     Past Surgical History:  Procedure Laterality Date  . BREAST EXCISIONAL BIOPSY Left    cyst excision - Dr. Patty Sermons  . CESAREAN SECTION    . CHOLECYSTECTOMY    . COLONOSCOPY    . EAR CYST EXCISION Right 07/04/2018   Procedure: EXCISION GLOMUS TUMOR THUMBNAIL;  Surgeon: Corky Mull, MD;   Location: [facility] ORS;  Service: Orthopedics;  Laterality: Right;  . ESOPHAGOGASTRODUODENOSCOPY    . ESOPHAGOGASTRODUODENOSCOPY (EGD) WITH PROPOFOL N/A 12/06/2015   Procedure: ESOPHAGOGASTRODUODENOSCOPY (EGD) WITH PROPOFOL;  Surgeon: Manya Silvas, MD;  Location: [facility] ENDOSCOPY;  Service: Endoscopy;  Laterality: N/A;  . IR ANGIO INTRA EXTRACRAN SEL INTERNAL CAROTID UNI L MOD SED  07/15/2019  . IR ANGIO VERTEBRAL SEL SUBCLAVIAN INNOMINATE UNI R MOD SED  07/15/2019  . IR CT HEAD LTD  07/15/2019  . IR INTRAVSC STENT CERV CAROTID W/O EMB-PROT MOD SED INC ANGIO  07/15/2019  . IR PERCUTANEOUS ART THROMBECTOMY/INFUSION INTRACRANIAL INC DIAG ANGIO  07/15/2019  . LAPAROSCOPIC GASTRIC BANDING    . LAPAROSCOPIC GASTRIC RESTRICTIVE DUODENAL PROCEDURE (DUODENAL SWITCH) N/A 01/25/2016   Procedure: LAPAROSCOPIC GASTRIC RESTRICTIVE DUODENAL PROCEDURE (DUODENAL SWITCH);  Surgeon: Ladora Daniel, MD;  Location: [facility] ORS;  Service: General;  Laterality: N/A;  . OVARY SURGERY     x2  . RADIOLOGY WITH ANESTHESIA N/A 07/15/2019   Procedure: IR WITH ANESTHESIA;  Surgeon: Luanne Bras, MD;  Location: [facility];  Service: Radiology;  Laterality: N/A;  . TONSILLECTOMY    . TUBAL LIGATION    . UMBILICAL HERNIA REPAIR N/A 01/25/2016   Procedure: LAPAROSCOPIC UMBILICAL HERNIA;  Surgeon:  Ladora Daniel, MD;  Location: [facility] ORS;  Service: General;  Laterality: N/A;  . UMBILICAL HERNIA REPAIR N/A 10/20/2016   Procedure: HERNIA REPAIR UMBILICAL ADULT;  Surgeon: Leonie Green, MD;  Location: [facility] ORS;  Service: General;  Laterality: N/A;    There were no vitals filed for this visit.   Subjective Assessment - 02/11/20 0833    Subjective  Patient reports her arm is starting to feel like it did before when she had a frozen shoulder, increased pain and stiffness in the past few days.    Pertinent History Per pt. chart. Pt. is a 49 y.o. female with history of HTN, T2DM, obesity who was admitted on 07/15/19 with onset of HA  progressing to flaccid left hemiparesis a few hours later.  CT head showed acute nonhemorrhagic infarct in right basal ganglia with hyperdense right MCA sign.  CT A/P head neck showed perfusion deficit with occluded right ICA and slow flow in M1.  She underwent cerebral angiogram with revascularization of right MCA with T1C1 revascularization and repair of right ICA with flow diverter device. Stroke felt to be embolic likely due to ICA dissection. Postprocedure head CT was negative for bleed repeat CTA head neck showed reocclusion of right ICA origin and occlusion of right ICA stent but now with patent right MCA. Pt. received inpatient rehab services for 3 weeks, and home health services.    Limitations LUE AROM    Patient Stated Goals Patient would like to be as independent as possible, return to her prior level of function    Currently in Pain? Yes    Pain Score 3     Pain Location Shoulder    Pain Descriptors / Indicators Aching    Pain Type Acute pain    Pain Onset In the past 7 days    Pain Frequency Constant             Patient reports Left arm starting to feel the way it did before with frozen shoulder, tenderness along the biceps tendon this date during therapy, pain 3/10.  Manual therapy:  Mobilizations to left shoulder for scapular and glenohumeral joint for posterior and inferior glides prior to ROM to decrease pain and increase active motion.    Therapeutic Exercises: Patient seen for Mat exercises in supine, difficulty with producing  shoulder flexion in supine this date without increased guiding from therapist.   Shoulder stabilization exercises with therapist assist to help hold and stabilize with shoulder at 90 degrees of shoulder flexion, place and hold, targeted movement between 80 and 120 degrees of shoulder flexion, triceps extension, reaching to opposite shoulder, small circles with arm placed at 90 degrees of flexion.    Ice biceps tendon at the end of exercise and  instructed patient on how to do ice cups at home to decrease pain and inflammation.    Response to tx:   Patient with increased pain this date limiting shoulder motion, pain appears to be more along the bicep tendon, pt also has a history of tendonitis.  Responded well to ROM followed by ice massage along bicep tendon at the end of session.  Patient required increased assist and guiding this date with shoulder exercises.  Will plan to monitor pain and ROM over the next few sessions and make adjustments as needed.  Follow up with patient next session regarding use of ice massage to see if she had any relief over the next few days.  OT Education - 02/11/20 [phone]    Education Details use of ice to decrease inflammation and pain, ROM exercises    Person(s) Educated Patient    Methods Demonstration;Explanation;Verbal cues    Comprehension Returned demonstration;Verbalized understanding;Verbal cues required               OT Long Term Goals - 02/04/20 1320      OT LONG TERM GOAL #1   Title Pt. will increase left shoulder flexion PROM by 20 degrees to assist with UE dressing    Baseline Shoulder flexion sitting 38 degrees, supine: 18(135).Pt. continues to present with limited left shoulder PROM    Time 12    Period Weeks    Status On-going    Target Date 02/25/20      OT LONG TERM GOAL #2   Title Pt. will be able to actively stabilize left shoulder for 10 seconds in supine with shoulder flexed to 90 degrees, and elbow in extension in preparation for functional reaching.    Baseline Pt. was able to initate shoulder stabilization with her shoulder flexed to 90 degrees, and her elbow extended for 5 sec. each for 3  reps. Pt. continues to activate intermittent muscle responses.    Time 12    Period Weeks    Status On-going    Target Date 02/25/20      OT LONG TERM GOAL #3   Title Pt. will independently perform hand to face patterns with the LUE in  preparation for light self grooming tasks.    Baseline Pt. is able to initiate performing hand to face patterns, and is able to reach her face, however is unable to sustain, and use her hand at her face.    Time 12    Period Weeks    Status On-going    Target Date 02/25/20      OT LONG TERM GOAL #4   Title Pt. will initiate 10 degrees of active left wrist extension  in preparation for actively lifting her hand off of a surface.    Baseline Wrist extension: 5(60)    Time 12    Period Weeks    Status On-going    Target Date 02/25/20      OT LONG TERM GOAL #5   Title Pt. will initiate left hand digit extension in order to be able to actively release an objects during ADL tasks.    Baseline Pt. presents with consistent 2nd, and 5th digit extension.    Time 12    Period Weeks    Status On-going    Target Date 02/25/20      OT LONG TERM GOAL #6   Title Pt. increase left hand digit flexion in order to be able to hold her toothbrush while applying toothpaste with her left right hand.    Baseline Pt. is initiating activating gross digit flexors.    Time 12    Period Weeks    Status On-going    Target Date 02/25/20                 Plan - 02/10/20 0836    Clinical Impression Statement Patient with increased pain this date limiting shoulder motion, pain appears to be more along the bicep tendon, pt also has a history of tendonitis.  Responded well to ROM followed by ice massage along bicep tendon at the end of session.  Patient required increased assist and guiding this date with shoulder exercises.  Will plan to monitor pain  and ROM over the next few sessions and make adjustments as needed.  Follow up with patient next session regarding use of ice massage to see if she had any relief over the next few days.    OT Occupational Profile and History Detailed Assessment- Review of Records and additional review of physical, cognitive, psychosocial history related to current functional  performance    Occupational performance deficits (Please refer to evaluation for details): ADL's;IADL's    Rehab Potential Good    Clinical Decision Making Several treatment options, min-mod task modification necessary    Comorbidities Affecting Occupational Performance: May have comorbidities impacting occupational performance    Modification or Assistance to Complete Evaluation  Min-Moderate modification of tasks or assist with assess necessary to complete eval    OT Frequency 2x / week    OT Duration 12 weeks    OT Treatment/Interventions Self-care/ADL training;Moist Heat;DME and/or AE instruction;Neuromuscular education;Therapeutic exercise;Therapeutic activities;Energy conservation;Patient/family education    Consulted and Agree with Plan of Care Patient           Patient will benefit from skilled therapeutic intervention in order to improve the following deficits and impairments:           Visit Diagnosis: Muscle weakness (generalized)  Other lack of coordination    Problem List Patient Active Problem List   Diagnosis Date Noted  . E. coli UTI   . Diabetes mellitus type 2 in obese ([place])   . Chronic pain of right knee   . 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 Hemiparesis affecting left side as late effect of stroke ([place])   . Acute ischemic right middle cerebral artery (MCA) stroke ([place]) 07/22/2019  . Carotid artery dissection  (HCC) s/p stent placement 07/21/2019  . Diabetes mellitus type II, uncontrolled ([place]) 07/21/2019  . Acute blood loss anemia 07/21/2019  . Leukocytosis 07/21/2019  . Stroke (cerebrum) ([place]) - R MCA infarct s/p tenecteplase and mechanical thrombectomy w/ d/t ICA dissection  07/15/2019  . Middle cerebral artery embolism, right 07/15/2019  . Controlled type 2 diabetes mellitus without complication, without long-term current use of insulin ([place]) 05/28/2017  . Attention deficit hyperactivity disorder (ADHD), combined type 04/18/2017  .  Morbid obesity ([place]) 01/25/2016  . Umbilical hernia 17/00/1749  . Hiatal hernia 12/08/2015  . History of Clostridium difficile colitis 06/19/2014  . HTN (hypertension) 12/23/2013  . PCOS (polycystic ovarian syndrome) 12/23/2013  . Bariatric surgery status 01/28/2013   Achilles Dunk, OTR/L, CLT  Kamalei Roeder 02/11/2020, 8:46 AM  [place] [facility] SERVICES [address] [place], [place], [address] Phone: [phone]   Fax:  [phone]  Name: Kathryn Coffey MRN: [id number] Date of Birth: 07-05-1971

## 2020-02-16 ENCOUNTER — Ambulatory Visit: Payer: No Typology Code available for payment source | Admitting: Occupational Therapy

## 2020-02-16 ENCOUNTER — Ambulatory Visit: Payer: No Typology Code available for payment source

## 2020-02-18 ENCOUNTER — Ambulatory Visit: Payer: No Typology Code available for payment source | Admitting: Occupational Therapy

## 2020-02-20 ENCOUNTER — Other Ambulatory Visit: Payer: Self-pay | Admitting: Physical Medicine and Rehabilitation

## 2020-02-20 MED ORDER — METHYLPREDNISOLONE 4 MG PO TBPK
ORAL_TABLET | ORAL | 0 refills | Status: DC
Start: 2020-02-20 — End: 2020-05-27

## 2020-02-25 ENCOUNTER — Encounter: Payer: No Typology Code available for payment source | Admitting: Occupational Therapy

## 2020-02-25 ENCOUNTER — Ambulatory Visit: Payer: No Typology Code available for payment source

## 2020-03-01 ENCOUNTER — Encounter: Payer: No Typology Code available for payment source | Admitting: Occupational Therapy

## 2020-03-01 ENCOUNTER — Ambulatory Visit: Payer: No Typology Code available for payment source | Admitting: Physical Therapy

## 2020-03-03 ENCOUNTER — Encounter: Payer: No Typology Code available for payment source | Admitting: Occupational Therapy

## 2020-03-08 ENCOUNTER — Ambulatory Visit: Payer: No Typology Code available for payment source

## 2020-03-08 ENCOUNTER — Encounter: Payer: No Typology Code available for payment source | Admitting: Occupational Therapy

## 2020-03-10 ENCOUNTER — Encounter: Payer: No Typology Code available for payment source | Admitting: Occupational Therapy

## 2020-03-10 ENCOUNTER — Other Ambulatory Visit: Payer: Self-pay

## 2020-03-10 ENCOUNTER — Ambulatory Visit: Payer: No Typology Code available for payment source

## 2020-03-10 MED ORDER — MIRABEGRON ER 25 MG PO TB24: 25.0000 mg | ORAL_TABLET | Freq: Every day | ORAL | 0 refills | Status: DC

## 2020-03-12 ENCOUNTER — Other Ambulatory Visit: Payer: Self-pay | Admitting: Internal Medicine

## 2020-03-15 ENCOUNTER — Encounter: Payer: No Typology Code available for payment source | Admitting: Occupational Therapy

## 2020-03-15 ENCOUNTER — Ambulatory Visit: Payer: No Typology Code available for payment source

## 2020-03-17 ENCOUNTER — Encounter: Payer: No Typology Code available for payment source | Admitting: Occupational Therapy

## 2020-03-22 ENCOUNTER — Ambulatory Visit: Payer: No Typology Code available for payment source

## 2020-03-24 ENCOUNTER — Encounter: Payer: No Typology Code available for payment source | Admitting: Occupational Therapy

## 2020-03-29 ENCOUNTER — Encounter: Payer: No Typology Code available for payment source | Admitting: Occupational Therapy

## 2020-03-29 ENCOUNTER — Ambulatory Visit: Payer: No Typology Code available for payment source

## 2020-03-31 ENCOUNTER — Ambulatory Visit: Payer: No Typology Code available for payment source

## 2020-03-31 ENCOUNTER — Encounter: Payer: No Typology Code available for payment source | Admitting: Occupational Therapy

## 2020-04-05 ENCOUNTER — Ambulatory Visit: Payer: No Typology Code available for payment source

## 2020-04-05 ENCOUNTER — Encounter: Payer: No Typology Code available for payment source | Admitting: Occupational Therapy

## 2020-04-06 ENCOUNTER — Other Ambulatory Visit: Payer: Self-pay

## 2020-04-06 ENCOUNTER — Encounter
Payer: No Typology Code available for payment source | Attending: Physical Medicine and Rehabilitation | Admitting: Physical Medicine and Rehabilitation

## 2020-04-06 ENCOUNTER — Telehealth: Payer: Self-pay | Admitting: *Deleted

## 2020-04-06 ENCOUNTER — Encounter: Payer: Self-pay | Admitting: Physical Medicine and Rehabilitation

## 2020-04-06 ENCOUNTER — Telehealth: Payer: Self-pay

## 2020-04-06 VITALS — BP 137/91 | HR 96 | Temp 98.6°F | Ht 65.0 in | Wt 236.4 lb

## 2020-04-06 DIAGNOSIS — M7522 Bicipital tendinitis, left shoulder: Secondary | ICD-10-CM | POA: Diagnosis not present

## 2020-04-06 DIAGNOSIS — M67912 Unspecified disorder of synovium and tendon, left shoulder: Secondary | ICD-10-CM | POA: Diagnosis not present

## 2020-04-06 NOTE — Telephone Encounter (Signed)
Kathryn Coffey would like the compound prescription you spoke about sent to [facility].

## 2020-04-06 NOTE — Progress Notes (Signed)
Shoulder injection, left shoulder  Indication:Left Shoulder pain not relieved by medication management and other conservative care.  Informed consent was obtained after describing risks and benefits of the procedure with the patient, this includes bleeding, bruising, infection and medication side effects. The patient wishes to proceed and has given written consent. Patient was placed in a seated position. The left shoulder was marked and prepped with betadine in the subacromial area. A 25-gauge 2 inch needle was inserted into the subacromial area. After negative draw back for blood, a solution containing 1 mL of celestone and 4 mL of 1% lidocaine was injected. A band aid was applied. The patient tolerated the procedure well. Post procedure instructions were given.

## 2020-04-06 NOTE — Telephone Encounter (Signed)
I called and left a detailed message on name identified VM for Kathryn Coffey. We are having problems with our ultrasound machine and I am letting her know the injection would need to be without ultrasound guide today. If she still prefers to have the U/S, she will need to reschedule. She called back and wants to proceed anyway.

## 2020-04-06 NOTE — Telephone Encounter (Signed)
I have called in, thanks!

## 2020-04-07 ENCOUNTER — Encounter: Payer: No Typology Code available for payment source | Admitting: Occupational Therapy

## 2020-04-12 ENCOUNTER — Encounter: Payer: No Typology Code available for payment source | Admitting: Occupational Therapy

## 2020-04-14 ENCOUNTER — Encounter: Payer: No Typology Code available for payment source | Admitting: Occupational Therapy

## 2020-04-14 ENCOUNTER — Ambulatory Visit: Payer: No Typology Code available for payment source

## 2020-04-15 ENCOUNTER — Other Ambulatory Visit: Payer: Self-pay | Admitting: Family Medicine

## 2020-04-19 ENCOUNTER — Ambulatory Visit: Payer: No Typology Code available for payment source

## 2020-04-19 ENCOUNTER — Other Ambulatory Visit: Payer: Self-pay | Admitting: Physical Medicine and Rehabilitation

## 2020-04-19 ENCOUNTER — Encounter: Payer: No Typology Code available for payment source | Admitting: Occupational Therapy

## 2020-04-19 MED ORDER — TIZANIDINE HCL 2 MG PO CAPS: 2.0000 mg | ORAL_CAPSULE | Freq: Three times a day (TID) | ORAL | 2 refills | Status: DC | PRN

## 2020-04-21 ENCOUNTER — Encounter: Payer: No Typology Code available for payment source | Admitting: Occupational Therapy

## 2020-04-27 ENCOUNTER — Other Ambulatory Visit: Payer: Self-pay | Admitting: Internal Medicine

## 2020-05-09 ENCOUNTER — Other Ambulatory Visit: Payer: Self-pay

## 2020-05-10 MED ORDER — MIRABEGRON ER 25 MG PO TB24: 25.0000 mg | ORAL_TABLET | Freq: Every day | ORAL | 0 refills | Status: DC

## 2020-05-10 NOTE — Telephone Encounter (Signed)
Refill request for Mirabegron. Is this okay to refill?

## 2020-05-12 ENCOUNTER — Encounter: Payer: No Typology Code available for payment source | Admitting: Physical Medicine and Rehabilitation

## 2020-05-27 ENCOUNTER — Encounter: Payer: Self-pay | Admitting: Physical Medicine and Rehabilitation

## 2020-05-27 ENCOUNTER — Encounter
Payer: No Typology Code available for payment source | Attending: Physical Medicine and Rehabilitation | Admitting: Physical Medicine and Rehabilitation

## 2020-05-27 ENCOUNTER — Other Ambulatory Visit: Payer: Self-pay

## 2020-05-27 VITALS — BP 143/94 | HR 83 | Temp 98.9°F | Ht 64.5 in | Wt 236.4 lb

## 2020-05-27 DIAGNOSIS — M67912 Unspecified disorder of synovium and tendon, left shoulder: Secondary | ICD-10-CM | POA: Diagnosis not present

## 2020-05-27 DIAGNOSIS — M7522 Bicipital tendinitis, left shoulder: Secondary | ICD-10-CM | POA: Diagnosis not present

## 2020-05-27 DIAGNOSIS — I6601 Occlusion and stenosis of right middle cerebral artery: Secondary | ICD-10-CM | POA: Diagnosis not present

## 2020-05-27 DIAGNOSIS — M7502 Adhesive capsulitis of left shoulder: Secondary | ICD-10-CM | POA: Diagnosis present

## 2020-05-27 DIAGNOSIS — R29898 Other symptoms and signs involving the musculoskeletal system: Secondary | ICD-10-CM | POA: Insufficient documentation

## 2020-05-27 NOTE — Patient Instructions (Addendum)
Prolotherapy  [employer]

## 2020-05-27 NOTE — Progress Notes (Signed)
Subjective:    Patient ID: Kathryn Coffey, female    DOB: 04-23-1971, 49 y.o.   MRN: [id number]  HPI  Kathryn Coffey presents for follow-up of her left arm flaccidity and biceps tendonitis following R MCA stroke.  She continues to have severe pain in her left shoulder which inhibits her range of motion, ability to perform ADLs, and her ability to work. It is also causing strain in her right shoulder due to overuse. We discussed options of repeat steroid injection (she had 2 months benefit from prior one), applying for SPRINT PNS again (this is what she most prefers- she has been very excited for this and is very disappointed we cannot get it done), prolotherapy (if not covered, out of pocket cost may be too much for her), and Biowave (need hospital approval).   Prior history:  She has been continuing with physical therapy and has seen improvements with her left arm strength! Continues to have flaccidity of left wrist and does not have a brace at home.   Her orthopedist mentioned that she has patellar dysplasia and that she would benefit from a knee brace.   She continues to have a bulge in her left arm and has been using Tizanidine 2mg  as needed for her pain. It still makes her sleepy but she is able to tolerate the dose better. I have sent refill recently.   She asks whether she should be on a statin. She is still not sure why she had a stroke. She wanted to participate in a trial with a robot at [facility] but unfortunately did not have enough left arm strength to qualify for the trial.    Pain Inventory Average Pain 4 Pain Right Now 3 My pain is dull and aching  In the last 24 hours, has pain interfered with the following? General activity 2 Relation with others 2 Enjoyment of life 3 What TIME of day is your pain at its worst? evening Sleep (in general) Good  Pain is worse with: some activites Pain improves with: rest, heat/ice and medication Relief from Meds: 2  Family History    Problem Relation Age of Onset  . Breast cancer Maternal Grandmother 25  . Diabetes Mother   . Diabetes Father   . Heart attack Father    Social History   Socioeconomic History  . Marital status: Married    Spouse name: Not on file  . Number of children: Not on file  . Years of education: Not on file  . Highest education level: Not on file  Occupational History  . Not on file  Tobacco Use  . Smoking status: Never Smoker  . Smokeless tobacco: Never Used  Vaping Use  . Vaping Use: Never used  Substance and Sexual Activity  . Alcohol use: Yes    Alcohol/week: 0.0 standard drinks    Comment: SOCIAL  . Drug use: No  . Sexual activity: Yes    Birth control/protection: I.U.D., Other-see comments    Comment: BTL  Other Topics Concern  . Not on file  Social History Narrative  . Not on file   Social Determinants of Health   Financial Resource Strain:   . Difficulty of Paying Living Expenses: Not on file  Food Insecurity:   . Worried About Charity fundraiser in the Last Year: Not on file  . Ran Out of Food in the Last Year: Not on file  Transportation Needs:   . Lack of Transportation (Medical): Not on file  .  Lack of Transportation (Non-Medical): Not on file  Physical Activity:   . Days of Exercise per Week: Not on file  . Minutes of Exercise per Session: Not on file  Stress:   . Feeling of Stress : Not on file  Social Connections:   . Frequency of Communication with Friends and Family: Not on file  . Frequency of Social Gatherings with Friends and Family: Not on file  . Attends Religious Services: Not on file  . Active Member of Clubs or Organizations: Not on file  . Attends Archivist Meetings: Not on file  . Marital Status: Not on file   Past Surgical History:  Procedure Laterality Date  . BREAST EXCISIONAL BIOPSY Left    cyst excision - Dr. Patty Sermons  . CESAREAN SECTION    . CHOLECYSTECTOMY    . COLONOSCOPY    . EAR CYST EXCISION Right 07/04/2018    Procedure: EXCISION GLOMUS TUMOR THUMBNAIL;  Surgeon: Corky Mull, MD;  Location: [facility] ORS;  Service: Orthopedics;  Laterality: Right;  . ESOPHAGOGASTRODUODENOSCOPY    . ESOPHAGOGASTRODUODENOSCOPY (EGD) WITH PROPOFOL N/A 12/06/2015   Procedure: ESOPHAGOGASTRODUODENOSCOPY (EGD) WITH PROPOFOL;  Surgeon: Manya Silvas, MD;  Location: [facility] ENDOSCOPY;  Service: Endoscopy;  Laterality: N/A;  . IR ANGIO INTRA EXTRACRAN SEL INTERNAL CAROTID UNI L MOD SED  07/15/2019  . IR ANGIO VERTEBRAL SEL SUBCLAVIAN INNOMINATE UNI R MOD SED  07/15/2019  . IR CT HEAD LTD  07/15/2019  . IR INTRAVSC STENT CERV CAROTID W/O EMB-PROT MOD SED INC ANGIO  07/15/2019  . IR PERCUTANEOUS ART THROMBECTOMY/INFUSION INTRACRANIAL INC DIAG ANGIO  07/15/2019  . LAPAROSCOPIC GASTRIC BANDING    . LAPAROSCOPIC GASTRIC RESTRICTIVE DUODENAL PROCEDURE (DUODENAL SWITCH) N/A 01/25/2016   Procedure: LAPAROSCOPIC GASTRIC RESTRICTIVE DUODENAL PROCEDURE (DUODENAL SWITCH);  Surgeon: Ladora Daniel, MD;  Location: [facility] ORS;  Service: General;  Laterality: N/A;  . OVARY SURGERY     x2  . RADIOLOGY WITH ANESTHESIA N/A 07/15/2019   Procedure: IR WITH ANESTHESIA;  Surgeon: Luanne Bras, MD;  Location: [facility];  Service: Radiology;  Laterality: N/A;  . TONSILLECTOMY    . TUBAL LIGATION    . UMBILICAL HERNIA REPAIR N/A 01/25/2016   Procedure: LAPAROSCOPIC UMBILICAL HERNIA;  Surgeon: Ladora Daniel, MD;  Location: [facility] ORS;  Service: General;  Laterality: N/A;  . UMBILICAL HERNIA REPAIR N/A 10/20/2016   Procedure: HERNIA REPAIR UMBILICAL ADULT;  Surgeon: Leonie Green, MD;  Location: [facility] ORS;  Service: General;  Laterality: N/A;   Past Medical History:  Diagnosis Date  . Abdominal wall cellulitis 2013 and 2014  . Asthma    allergic to grasses  . B12 deficiency   . Complication of anesthesia   . Costochondritis   . Diabetes mellitus without complication ([place])   . Gastric anomaly    multiple small ulcers  . GERD (gastroesophageal  reflux disease)   . Herpes 06/2018   POSSIBLY IN EYE- PT TAKING VALTREX AND WILL SEE OPTHAMOLOGIST TO SEE IF VALTREX IS WORKING  . History of abnormal cervical Pap smear   . History of Clostridium difficile colitis   . History of hiatal hernia   . History of kidney stones   . Hypertension   . IBS (irritable bowel syndrome)   . Migraine   . Morbid obesity ([place])    s/p attempted gastric banding now decompressed  . PCOS (polycystic ovarian syndrome)   . PONV (postoperative nausea and vomiting)    WITH SPINAL ONLY  .  Stroke ([place])    BP (!) 143/94   Pulse 83   Temp 98.9 F (37.2 C)   Ht 5' 4.5" (1.638 m)   Wt 236 lb 6.4 oz (107.2 kg) Comment: last weight recorded  SpO2 99%   BMI 39.95 kg/m   Opioid Risk Score:   Fall Risk Score:  `1  Depression screen PHQ 2/9  Depression screen [facility] 2/9 05/27/2020 04/06/2020 06/06/2017 10/11/2015  Decreased Interest 1 1 1  0  Down, Depressed, Hopeless 1 1 0 0  PHQ - 2 Score 2 2 1  0  Some recent data might be hidden     Review of Systems  Constitutional: Negative.   HENT: Negative.   Eyes: Negative.   Respiratory: Negative.   Cardiovascular: Negative.   Gastrointestinal: Positive for constipation and nausea.  Endocrine: Negative.   Genitourinary: Negative.   Musculoskeletal: Positive for gait problem and myalgias.  Skin: Negative.   Allergic/Immunologic: Negative.   Neurological: Positive for weakness.  Hematological: Negative.   Psychiatric/Behavioral: Negative.   All other systems reviewed and are negative.      Objective:   Physical Exam Gen: no distress, normal appearing HEENT: oral mucosa pink and moist, NCAT Cardio: Reg rate Chest: normal effort, normal rate of breathing Abd: soft, non-distended Ext: no edema Skin: intact Neuro: Alert and oriented x3. Musculoskeletal: Severely limited range of motion of left shoulder- 0 external rotation. 30 degrees of abduction and elevation. Tenderness over shoulder joint and biceps  tendon.  Psych: pleasant, normal affect    Assessment & Plan:  Kathryn Coffey presents for follow-up of adhesive capsulitis and bicipital tendonitis and L arm weakness following R MCA stroke.   1) Adhesive capsulitis/Left biceps tendonitis --Patient would prefer SPRINT PNS and is very disappointed that this was not authorized. Will try again to get it authorized. She would greatly benefit from it for both pain relief and mobility. --Discussed alternative options of prolotherapy (will apply for authorization), and Biowave (would need our hospital's approval).  --Can continue Etodolac and Robaxin as needed for pain control. --Can consider repeating steroid injection after November 17th but advised regarding cartilage damage and muscle atrophy with repeat steroid injections.   2) R MCA stroke --Continue therapy for LUE strengthening, now working on Laurie her on continuing working -Prescribed L WHO to keep wrist in neutral position -Prescribed left knee brace given patella dysplasia.   3) Right carpal tunnel syndrome: Advised to wear brace at night as well.   All questions were encouraged and answered. Follow up with me PRN.

## 2020-06-07 ENCOUNTER — Other Ambulatory Visit: Payer: Self-pay | Admitting: Physical Medicine and Rehabilitation

## 2020-06-07 MED ORDER — TIZANIDINE HCL 2 MG PO CAPS: 2.0000 mg | ORAL_CAPSULE | Freq: Three times a day (TID) | ORAL | 2 refills | Status: DC | PRN

## 2020-06-17 ENCOUNTER — Other Ambulatory Visit: Payer: Self-pay | Admitting: Internal Medicine

## 2020-06-23 ENCOUNTER — Other Ambulatory Visit: Payer: Self-pay | Admitting: Internal Medicine

## 2020-07-08 ENCOUNTER — Other Ambulatory Visit: Payer: Self-pay | Admitting: Neurology

## 2020-07-12 ENCOUNTER — Other Ambulatory Visit: Payer: Self-pay | Admitting: Internal Medicine

## 2020-07-13 ENCOUNTER — Other Ambulatory Visit: Payer: Self-pay | Admitting: Internal Medicine

## 2020-07-26 ENCOUNTER — Other Ambulatory Visit: Payer: Self-pay | Admitting: Physical Medicine and Rehabilitation

## 2020-07-26 MED ORDER — TIZANIDINE HCL 4 MG PO TABS
4.0000 mg | ORAL_TABLET | Freq: Three times a day (TID) | ORAL | 1 refills | Status: DC
Start: 2020-07-26 — End: 2020-07-26

## 2020-07-27 ENCOUNTER — Other Ambulatory Visit: Payer: Self-pay | Admitting: Internal Medicine

## 2020-08-02 ENCOUNTER — Encounter: Payer: Self-pay | Admitting: Counselor

## 2020-08-09 ENCOUNTER — Other Ambulatory Visit: Payer: Self-pay | Admitting: Internal Medicine

## 2020-08-10 ENCOUNTER — Other Ambulatory Visit: Payer: Self-pay | Admitting: Neurology

## 2020-08-17 ENCOUNTER — Other Ambulatory Visit: Payer: Self-pay | Admitting: Internal Medicine

## 2020-08-19 ENCOUNTER — Encounter: Payer: Self-pay | Admitting: Counselor

## 2020-08-19 ENCOUNTER — Other Ambulatory Visit: Payer: Self-pay

## 2020-08-19 ENCOUNTER — Ambulatory Visit (INDEPENDENT_AMBULATORY_CARE_PROVIDER_SITE_OTHER): Payer: No Typology Code available for payment source | Admitting: Counselor

## 2020-08-19 ENCOUNTER — Encounter: Payer: No Typology Code available for payment source | Admitting: Counselor

## 2020-08-19 DIAGNOSIS — I69398 Other sequelae of cerebral infarction: Secondary | ICD-10-CM | POA: Diagnosis not present

## 2020-08-19 DIAGNOSIS — F09 Unspecified mental disorder due to known physiological condition: Secondary | ICD-10-CM | POA: Diagnosis not present

## 2020-08-19 DIAGNOSIS — I6930 Unspecified sequelae of cerebral infarction: Secondary | ICD-10-CM | POA: Diagnosis not present

## 2020-08-19 DIAGNOSIS — F0631 Mood disorder due to known physiological condition with depressive features: Secondary | ICD-10-CM | POA: Diagnosis not present
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
## 2020-08-24 NOTE — Progress Notes (Signed)
[facility]  Patient Name: ADDILEIGH HUFFSTUTLER MRN: [id number] Date of Birth: Jul 06, 1971 Age: 50 y.o. Education: 14 years  Measurement properties of test scores: IQ, Index, and Standard Scores (SS): Mean = 100; Standard Deviation = 15 Scaled Scores (Ss): Mean = 10; Standard Deviation = 3 Z scores (Z): Mean = 0; Standard Deviation = 1 T scores (T); Mean = 50; Standard Deviation = 10  TEST SCORES:    Note: This summary of test scores accompanies the interpretive report and should not be interpreted by unqualified individuals or in isolation without reference to the report. Test scores are relative to age, gender, and educational history as available and appropriate.   Performance Validity        ACS: Raw  Descriptor      Word Choice 50 Within Expectation       Raw  Descriptor  The Dot Counting Test: 8 Within Expectation      Embedded Measures: Raw  Descriptor      NAB Effort Index 0 Within Expectation      Mental Status Screening     Total Score Descriptor  MoCA 25 Normal      Expected Functioning        Wide Range Achievement Test: Standard/Scaled Score Percentile      Word Reading 100 50      Re[place]olds Intellectual Screening Test Standard/T-score Percentile      Guess What 50 50      Odd Item Out 48 42  RIST Index 99 47      Attention/Processing Speed        Neuropsychological Assessment Battery (Attention Module, Form 1): Scaled/T-score Percentile  Attention Index (ATT): 95 37      Dots 52 58      Digits Forward 54 66      Digits Backwards 60 84      Numbers & Letters A Efficiency 39 14      Numbers & Letters B Efficiency 46 34      Numbers & Letters C Efficiency 33 5      Numbers & Letters D Efficiency 49 46      Driving Scenes 52 58          Language        Neuropsychological Assessment Battery (Language Module, Form 1): T-score Percentile      Naming   (30) 53 62      Verbal Fluency:  T Score Percentile       Controlled Oral Word Association (F-A-S) 38 12      Semantic Fluency (Animals) 49 46      Memory:        Neuropsychological Assessment Battery (Memory Module, Form 1): T-score/Standard Score Percentile  Memory Index (MEM): 107 68      List Learning           List A Immediate Recall   (8 , 8 , 9) 47 38         List B Immediate Recall   (5) 49 46         List A Short Delayed Recall   (9) 52 58         List A Long Delayed Recall   (8) 48 42         List A Percent Retention   (89 %) --- 31         List A Long Delayed Yes/No Recognition Hits   (10) ---  21         List A Long Delayed Yes/No Recognition False Alarms   (1) --- 69         List A Recognition Discriminability Index --- 54      Shape Learning           Immediate Recognition   (7 , 6 , 7) 55 69         Delayed Recognition   (7) 54 66         Percent Retention   (100 %) --- 46         Delayed Forced-Choice Recognition Hits   (6) --- 2         Delayed Forced-Choice Recognition False Alarms   (0) --- 69         Delayed Forced-Choice Recognition Discriminability --- 12      Story Learning           Immediate Recall   (31, 39) 54 66         Delayed Recall   (36) 52 58         Percent Retention   (92 %) --- 38      Daily Living Memory            Immediate Recall   (26, 23) 63 91          Delayed Recall   (8, 8) 57 75          Percent Retention (94 %) --- 58          Recognition Hits   (9) --- 38      Visuospatial/Constructional Functioning        Neuropsychological Assessment Battery (Visuospatial Module) T-score Percentile      Visual Discrimination 45 31      Design Construction 54 66      Executive Functioning        Modified [place] Card Sorting Test (MWCST): Standard/T-Score Percentile      Number of Categories Correct 54 66      Number of Perseverative Errors 61 86      Number of Total Errors 64 92      Percent Perseverative Errors 60 84  Executive Function Composite 112 79      Trail Making Test: T-Score  Percentile      Part A 53 62      Part B 50 50      Clock Drawing Raw Score Descriptor      Command 10 WNL      Rating Scales         Raw Score Descriptor  Patient Health Questionnaire - 9 17 Moderately-Severe  GAD-7 13 Moderate       Raw Score Descriptor  Functional Activities Questionnaire 9 ---   Bettye Boeck. Roseanne Reno PsyD, ABN Clinical Neuropsychologist

## 2020-08-27 ENCOUNTER — Ambulatory Visit (INDEPENDENT_AMBULATORY_CARE_PROVIDER_SITE_OTHER): Payer: No Typology Code available for payment source | Admitting: Counselor

## 2020-08-27 ENCOUNTER — Other Ambulatory Visit: Payer: Self-pay

## 2020-08-27 ENCOUNTER — Encounter: Payer: Self-pay | Admitting: Counselor

## 2020-08-27 DIAGNOSIS — I69398 Other sequelae of cerebral infarction: Secondary | ICD-10-CM | POA: Diagnosis not present

## 2020-08-27 DIAGNOSIS — I69319 Unspecified symptoms and signs involving cognitive functions following cerebral infarction: Secondary | ICD-10-CM

## 2020-08-27 DIAGNOSIS — F0631 Mood disorder due to known physiological condition with depressive features: Secondary | ICD-10-CM | POA: Diagnosis not present

## 2020-08-27 NOTE — Progress Notes (Signed)
[facility]  Feedback Note: I met with Kathryn Coffey to review the findings resulting from her neuropsychological evaluation. Since the last appointment, she has been about the same. She thought the appointment was at 10:30 and couldn't find parking so she was rushing. Time was spent reviewing the impressions and recommendations that are detailed in the evaluation report. We discussed impression of minimal issues from a testing perspective, which she agreed with. She thinks the biggest residua for her are from movement issues and her cognitive difficulties are mild. She admits that she is used to "running circles around everybody," and now that she is on a more normal level, it is irritating. We also discussed depression and effects on recovery and cognition, and she accepted a referral for psychotherapy. Other topics of conversation as reflected in the patient instructions. I took time to explain the findings and answer all the patient's questions. I encouraged Kathryn Coffey to contact me should she have any further questions or if further follow up is desired.   Current Medications and Medical History   Current Outpatient Medications  Medication Sig Dispense Refill  . acetaminophen (TYLENOL) 325 MG tablet Take 1-2 tablets (325-650 mg total) by mouth every 4 (four) hours as needed for mild pain.    Marland Kitchen albuterol (PROVENTIL HFA;VENTOLIN HFA) 108 (90 Base) MCG/ACT inhaler Inhale 2 puffs into the lungs every 6 (six) hours as needed for wheezing or shortness of breath.    . amphetamine-dextroamphetamine (ADDERALL) 15 MG tablet Take 15 mg by mouth 2 (two) times daily.    Marland Kitchen aspirin 81 MG chewable tablet Chew 1 tablet (81 mg total) by mouth daily.    Marland Kitchen buPROPion (WELLBUTRIN) 100 MG tablet TAKE 1 TABLET BY MOUTH TWICE DAILY 60 tablet 0  . Calcium Carb-Cholecalciferol (CALCIUM + VITAMIN D3 PO) Take 1 tablet by mouth daily.    . Cysteamine Bitartrate (PROCYSBI) 300 MG PACK  Use 1 each 2 (two) times daily Use as instructed.    . escitalopram (LEXAPRO) 5 MG tablet Take by mouth.    . etodolac (LODINE) 400 MG tablet Take by mouth.    . ferrous sulfate 325 (65 FE) MG tablet Take 1 tablet (325 mg total) by mouth daily with breakfast. 30 tablet 0  . fexofenadine (ALLEGRA) 180 MG tablet Take 180 mg by mouth daily as needed for allergies.     . fluticasone (FLONASE) 50 MCG/ACT nasal spray Place 2 sprays into both nostrils daily as needed for allergies or rhinitis.     Marland Kitchen glucose blood (PRECISION QID TEST) test strip Use 1 each (1 strip total) 2 (two) times daily Use as instructed.    . liraglutide (VICTOZA) 18 MG/3ML SOPN Inject 0.2 mLs (1.2 mg total) into the skin daily.    Marland Kitchen losartan (COZAAR) 25 MG tablet Take 1 tablet (25 mg total) by mouth daily. 30 tablet 1  . losartan-hydrochlorothiazide (HYZAAR) 50-12.5 MG tablet Take 1 tablet by mouth daily.    . Menthol-Methyl Salicylate (MUSCLE RUB) 10-15 % CREA Apply 1 application topically 2 (two) times daily as needed for muscle pain.  0  . metFORMIN (GLUCOPHAGE) 500 MG tablet Take 500 mg by mouth 2 (two) times daily.  11  . methocarbamol (ROBAXIN) 500 MG tablet Take 1 tablet (500 mg total) by mouth 4 (four) times daily. 60 tablet 1  . mirabegron ER (MYRBETRIQ) 25 MG TB24 tablet Take 1 tablet (25 mg total) by mouth daily. 30 tablet 0  . Multiple  Vitamins-Minerals (MULTIVITAMIN PO) Take 2 tablets by mouth daily.    . NON FORMULARY 1 Dose once a week. Wednesdays    . NONFORMULARY OR COMPOUNDED ITEM DICLOF/GABA/MENTH GEL    . omeprazole (PRILOSEC) 40 MG capsule Take 40 mg by mouth daily. Dose unknown    . sertraline (ZOLOFT) 50 MG tablet     . ticagrelor (BRILINTA) 90 MG TABS tablet Take 1 tablet (90 mg total) by mouth 2 (two) times daily. 60 tablet 1  . tiZANidine (ZANAFLEX) 4 MG tablet Take 1 tablet (4 mg total) by mouth 3 (three) times daily. 90 tablet 1  . topiramate (TOPAMAX) 25 MG tablet Take 1 tablet (25 mg total) by mouth  at bedtime. 30 tablet 0  . triamterene-hydrochlorothiazide (DYAZIDE) 37.5-25 MG capsule Take 1 capsule by mouth every morning.     No current facility-administered medications for this visit.    Patient Active Problem List   Diagnosis Date Noted  . E. coli UTI   . Diabetes mellitus type 2 in obese ([place])   . Chronic pain of right knee   . 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 Hemiparesis affecting left side as late effect of stroke ([place])   . Acute ischemic right middle cerebral artery (MCA) stroke ([place]) 07/22/2019  . Carotid artery dissection  (HCC) s/p stent placement 07/21/2019  . Diabetes mellitus type II, uncontrolled ([place]) 07/21/2019  . Acute blood loss anemia 07/21/2019  . Leukocytosis 07/21/2019  . Stroke (cerebrum) ([place]) - R MCA infarct s/p tenecteplase and mechanical thrombectomy w/ d/t ICA dissection  07/15/2019  . Middle cerebral artery embolism, right 07/15/2019  . Controlled type 2 diabetes mellitus without complication, without long-term current use of insulin ([place]) 05/28/2017  . Attention deficit hyperactivity disorder (ADHD), combined type 04/18/2017  . Morbid obesity ([place]) 01/25/2016  . Umbilical hernia 34/91/7915  . Hiatal hernia 12/08/2015  . History of Clostridium difficile colitis 06/19/2014  . HTN (hypertension) 12/23/2013  . PCOS (polycystic ovarian syndrome) 12/23/2013  . Bariatric surgery status 01/28/2013   Mental Status and Behavioral Observations  Kathryn Coffey presented on time to the present encounter and was alert and generally oriented. Speech was normal in rate, rhythm, volume, and prosody. Self-reported mood was "fine" and affect was neutral with an undercurrent of stress. Thought process was logical and goal oriented and thought content was appropriate. Kathryn Coffey had previously endorsed an item suggesting she had thoughts she would be better of dead, which I queried with her. She said that in the past, she was having nonsuicidal morbid  ideation, such as her family members would be better off if she were dead, but she doesn't feel that way now.   Plan  Feedback provided regarding the patient's neuropsychological evaluation. She is doing very well cognitively, and will likely improve further with better control of her mood. She will work on exercise and did make it to [employer] last week. I placed a referral to [facility] for psychotherapy. I encouraged her to read the book that Dr. Manuella Ghazi has recommended, she got a copy, but has not read it yet. Kathryn Coffey was encouraged to contact me if any questions arise or if further follow up is desired.   Viviano Simas Nicole Kindred, PsyD, ABN Clinical Neuropsychologist  Service(s) Provided at This Encounter: 30 minutes [phone]; Psychotherapy with patient/family)

## 2020-08-27 NOTE — Progress Notes (Signed)
[facility]  Patient Name: Kathryn Coffey MRN: [id number] Date of Birth: 09-19-1970 Age: 50 y.o. Education: 76 years  Clinical Impressions  Kathryn Coffey is a 50 y.o., right-hand dominant, married woman with a history of ischemic stroke in the R MCA distribution, affecting a large portion of the basal ganglia on that side in addition to the right insular area and peripheral right temporal lobe. The stroke occurred on 07/15/2019, she was in the hospital for about a week, in rehabilitation for about 3 weeks, and was then able to return to work full time in July, 2021. She remains with residual partial left hemiparesis affecting the arm and leg on that side, very significant and debilitating fatigue, and she appreciates some cognitive difficulties. Her day-to-day cognitive problems mainly involve executive control difficulties; she is able to do things but is not as efficient, sometimes she gets side tracked, and she has issues with attentiveness and finds her work much more challenging than in the past.   Despite Kathryn Coffey's report of difficulties and a neurologically significant injury that would be expected to result in some level of cognitive difficulty, Kathryn Coffey's cognitive test scores fell within normal expectations for her. Specifically, she performed near the top of the average range for memory overall and in the average range for attention and processing efficiency when considered at a domain level. She did have some scattered low scores on attention measures, but they do not meet the threshold required for abnormality. She screened in the moderately severe range for depressive symptoms and in the moderate range for anxiety symptoms.   Kathryn Coffey is thus demonstrating good cognitive test performance. This does not mean, however, that no decline has occurred relative to her previous baseline. Rather, it suggests that under ideal circumstances, she is capable  of good normative neuropsychological test performance as compared to her age and education cohort. Individuals with non-dominant hemisphere lesions and frontal-subcortical type issues often perform much more poorly in the real world than they do on testing and some level of difficulty is expected following an infarction such as she sustained. Nevertheless, findings suggest good residual capacities from a neuropsychological perspective. I would recommend that Kathryn Coffey continue to practice good sleep hygiene, manage her diabetes, and engage in good secondary stroke prevention practices including exercise and losing weight in addition to medical therapies. She is set up for a sleep study, which may also be helpful. Her self-report scores indicate that her depression is not well controlled and further medication titration or adjuvent psychotherapy could be considered.   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 That is, you performed within gross limits of normal for you in every area. Normal test performance does not mean that no decline has occurred. It means that you are capable of good neuropsychological test performance as compared to your age and education cohort, which suggests good residual capacities, which is positive. I do suspect that a decline has occurred relative to your previous baseline given the stroke you sustained and your report of problems.  Individuals who have non-dominant hemisphere lesions and executive control problems often perform more poorly in the real world than on testing, which may be a factor for you.   You should know that further improvement is possible. As Dr. Manuella Ghazi has observed, plasticity does occur and adaptation continues long after a stroke  event. I would encourage you to keep trying to be your  best and have patience with yourself if you do not feel that you are.    As for work, it is positive that you have been able to return. I would encourage you to continue to manage your involvement in work under Dr. Trena Platt guidance to achieve a workable, long-term plan.   Do not underestimate the effect of mood on energy level, memory and thinking, and concentration abilities. You reported a moderately severe level of symptoms on a self-rating scale of depression and moderate levels of symptoms on a self-rating scale of anxiety. Effective treatments for depression include mediations and psychotherapy. You are already on medications although titration and or psychiatric consultation may be helpful. You could also consider psychotherapy  Perhaps most importantly, I would suggest that you have reasonable expectations for yourself. While it is difficult adjusting to physical and cognitive difficulties, it is not your fault, you still have many strengths, and are clearly capable of doing many things with skill. I would urge you to take your time, avoid from multitasking, and be patient with yourself. Be an observer, present minded, and focus your attention on the task at hand. Plan rest breaks if maintaining attention for long periods of time is difficult. Also realize that some of your difficulties may improve if you get your mood and fatigue under better control.   Test Findings  Test scores are summarized in additional documentation associated with this encounter. Test scores are relative to age, gender, and educational history as available and appropriate. There were no concerns about performance validity as all findings fell within normal expectations.   General Intellectual Functioning/Achievement:  Performance on single word reading was average and performance on the RIST index was average, making average a good basis of comparison for her cognitive test data.   Attention and Processing  Efficiency: Performance on indicators of attention and processing efficiency was average overall. On verbally mediated indicators of digit repetition forward and backward, she scored in the average and high average ranges, respectively. On visually mediated indicators involving identifying changes to a series of printed dot patterns and driving scenes, her scores were average.  Timed measures of processing efficiency generated mixed scores. She performed at a low average level on a measure involving letter cancellation, an average level on a measure involving counting, an unusually low level on a measure involving mental addition under time pressure, and at an average level on a measure involving alternating attention between letter cancellation and number addition.   Language: Performance on language measures was generally within normal limits. Visual object confrontation naming was near errorless. Generation of words was low average in response to letter cues and average in response to the category cue "animals."   Visuospatial Function: Fine visual discrimination and attention to detail were average when matching a target design amongst false choices. Construction of two-dimensional target designs from a set of small plastic tile pieces was average.   Learning and Memory: Performance on measures of learning and memory was toward the high end of the average range, overall. Good acquisition and retention of information were demonstrated across time for both verbal and visual information.   In the verbal realm, immediate recall was average for a word list and short story with comparable average range delayed recall. Delayed recognition discrimination for the words from the word list was average. Memory for brief daily-living information was a bit better, with high average (nearly superior) immediate recall  and high average delayed recall.   In the visual realm, immediate recognition of a series of  designs that are difficult to verbally encode was average followed by average delayed recognition. Yes/no discriminability for the designs was low average.   Executive Functions: Performance on excutive measures generally fell within the average range, with the exception of generation of words in response to the letters F-A-S, which was low average. Performance on the Executive Function Composite of the Modified [employer] was high average. Alternating sequencing of numbers and letters of the alphabet was average. Clock drawing was within normal limits with good face formation, number placement, and hand placement.   Rating Scale(s): Ms. Onofre screened in the moderately severe range for depressive symptoms and the moderate range for anxiety symptoms. Her husband characterized her as having some functional difficulties but not dependent in any area.   Viviano Simas Nicole Kindred PsyD, [place] Clinical Neuropsychologist

## 2020-08-27 NOTE — Patient Instructions (Signed)
Your performance and presentation on neuropsychological assessment were entirely normal. That is, you performed within gross limits of normal for you in every area. Normal test performance does not mean that no decline has occurred. It means that you are capable of good neuropsychological test performance as compared to your age and education cohort, which suggests good residual capacities, which is positive. I do suspect that a decline has occurred relative to your previous baseline given the stroke you sustained and your report of problems.  Individuals who have non-dominant hemisphere lesions and executive control problems often perform more poorly in the real world than on testing, which may be a factor for you.   You should know that further improvement is possible. As Dr. Manuella Ghazi has observed, plasticity does occur and adaptation continues long after a stroke event. I would encourage you to keep trying to be your best and have patience with yourself if you do not feel that you are.    As for work, it is positive that you have been able to return. I would encourage you to continue to manage your involvement in work under Dr. Trena Platt guidance to achieve a workable, long-term plan.   Do not underestimate the effect of mood on energy level, memory and thinking, and concentration abilities. You reported a moderately severe level of symptoms on a self-rating scale of depression and moderate levels of symptoms on a self-rating scale of anxiety. Effective treatments for depression include mediations and psychotherapy. You are already on medications although titration and or psychiatric consultation may be helpful.   You were interested in psychotherapy, so I made a referral to [facility]. They should be in touch to schedule.   Perhaps most importantly, I would suggest that you have reasonable expectations for yourself. While it is difficult adjusting to physical and cognitive difficulties, it  is not your fault, you still have many strengths, and are clearly capable of doing many things with skill. I would urge you to take your time, avoid from multitasking, and be patient with yourself. Be an observer, present minded, and focus your attention on the task at hand. Plan rest breaks if maintaining attention for long periods of time is difficult. Also realize that some of your difficulties may improve if you get your mood and fatigue under better control.

## 2020-09-07 ENCOUNTER — Other Ambulatory Visit: Payer: Self-pay | Admitting: Internal Medicine

## 2020-09-15 ENCOUNTER — Other Ambulatory Visit: Payer: Self-pay | Admitting: Counselor

## 2020-09-15 DIAGNOSIS — F0631 Mood disorder due to known physiological condition with depressive features: Secondary | ICD-10-CM

## 2020-09-15 DIAGNOSIS — I69398 Other sequelae of cerebral infarction: Secondary | ICD-10-CM

## 2020-09-17 ENCOUNTER — Other Ambulatory Visit: Payer: Self-pay | Admitting: Internal Medicine

## 2020-09-20 ENCOUNTER — Other Ambulatory Visit: Payer: Self-pay | Admitting: Internal Medicine

## 2020-09-22 ENCOUNTER — Other Ambulatory Visit: Payer: Self-pay | Admitting: Internal Medicine

## 2020-09-27 ENCOUNTER — Other Ambulatory Visit: Payer: Self-pay | Admitting: Internal Medicine

## 2020-10-01 ENCOUNTER — Other Ambulatory Visit: Payer: Self-pay | Admitting: Internal Medicine

## 2020-10-08 ENCOUNTER — Other Ambulatory Visit: Payer: Self-pay | Admitting: Neurology

## 2020-10-26 ENCOUNTER — Other Ambulatory Visit: Payer: Self-pay | Admitting: Internal Medicine

## 2020-10-26 ENCOUNTER — Ambulatory Visit: Payer: No Typology Code available for payment source | Admitting: Psychiatry

## 2020-10-27 ENCOUNTER — Ambulatory Visit (INDEPENDENT_AMBULATORY_CARE_PROVIDER_SITE_OTHER): Payer: No Typology Code available for payment source | Admitting: Psychiatry

## 2020-10-27 ENCOUNTER — Other Ambulatory Visit: Payer: Self-pay

## 2020-10-27 DIAGNOSIS — F322 Major depressive disorder, single episode, severe without psychotic features: Secondary | ICD-10-CM
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
## 2020-10-28 ENCOUNTER — Ambulatory Visit: Payer: No Typology Code available for payment source | Admitting: Psychiatry

## 2020-10-29 ENCOUNTER — Other Ambulatory Visit: Payer: Self-pay | Admitting: Internal Medicine

## 2020-11-01 ENCOUNTER — Ambulatory Visit (INDEPENDENT_AMBULATORY_CARE_PROVIDER_SITE_OTHER): Payer: No Typology Code available for payment source | Admitting: Psychiatry

## 2020-11-01 ENCOUNTER — Other Ambulatory Visit: Payer: Self-pay

## 2020-11-01 DIAGNOSIS — F322 Major depressive disorder, single episode, severe without psychotic features: Secondary | ICD-10-CM

## 2020-11-01 NOTE — Progress Notes (Signed)
[facility] Counselor/Therapist Progress Note  Patient ID: Kathryn Coffey, MRN: [id number],    Date: 11/01/2020  Time Spent: 60 minutes   4:00pm to 5:00pm  Treatment Type: Individual Therapy  Reported Symptoms: anxiety, depression, fatigue, worrying, assuming and looking for what may go wrong versus right  Mental Status Exam:  Appearance:   Casual     Behavior:  Appropriate, Sharing and Motivated  Motor:  Normal  Speech/Language:   Clear and Coherent  Affect:  anxious, depressed  Mood:  anxious and depressed  Thought process:  goal directed  Thought content:    some tangentiality  Sensory/Perceptual disturbances:    WNL  Orientation:  oriented to person, place, time/date, situation, day of week, month of year and year  Attention:  Fair  Concentration:  Poor  Memory:  forgetful; Dr is aware, worse after stroke  Fund of knowledge:   Good  Insight:    Fair  Judgment:   Poor  Impulse Control:  Poor   Risk Assessment: Danger to Self:  No Self-injurious Behavior: No Danger to Others: No Duty to Warn:no Physical Aggression / Violence:No  Access to Firearms a concern: No  Gang Involvement:No   Subjective: Patient today reporting anxiety, depression, fatigue, worrying , and assumes the worst in situation. Poor choices, difficult to say no to unhealthy eating.  Interventions: Cognitive Behavioral Therapy, Solution-Oriented/Positive Psychology and Ego-Supportive  Diagnosis:   ICD-10-CM   1. Severe major depression, single episode, without psychotic features ([place])  F32.2      Plan of Care:  Patient not signing treatment plan on computer screen due Covid.  Treatment Plan: Goals remain on treatment plan as patient works with strategies to achieve her goals.  Progress is notes each visit and documented in "progress" section of Note.  Long term goal: Develop the ability to recognize, accept, and cope with feelings of depression.   Short term goal: Verbalize  and understanding of the relationship between repressed anger and depressed mood.  Work on resolving her repressed anger in order to move forward.  Strategy: Replace negative self-defeating self talk with verbalization of realistic and positive cognitive messages. Make positive statements regarding self and ability to cope with stresses of life.  Progress: Patient in today reporting anxiety, depression, fatigue, looks for the negative versus positive, poor choices, hard to say No to unhealthy foods.  Also, today she went into much more detail about her eating disordered behaviors that really concerns me.  Has tried [place] program and just can't seem to stick with it.  States that she is currently weighing 255 pounds.  Admits that she has difficulty making unhealthy food purchases and consumption. Describes what she states is a food addiction problem, "cant say No, was raised to make people happy by making food, makes poor choices even though she reports the stroke has scared her but hasn't stopped her from the bad selections of food and overeating, I just can't say No when I need to do so."  Discussed with her about a possible transfer to an eating disordered specialist which I believe is what this patient is needing.  If she continues the unhealthy patterns of choosing and consuming food I am very concerned about her diabetes and the possibility of another stroke, or other very serious health concerns.** Patient agreed that she would see an eating disorder specialist and I provided her with 3 names and contact numbers of specialists here in [place].  She is to follow through and  call them tomorrow and secure an appt. The plan is for her to follow-up with an eating disorder specialist rather than being seen here in our office.  She seems to be feeling supported and understands my reasoning for wanting her to see an eating disorder specialist.  Patient is to contact me to verify that she has gotten an  appointment with an eating disorder specialist.     Shanon Ace, LCSW

## 2020-11-02 ENCOUNTER — Other Ambulatory Visit: Payer: Self-pay | Admitting: Physical Medicine and Rehabilitation

## 2020-11-02 DIAGNOSIS — R29898 Other symptoms and signs involving the musculoskeletal system: Secondary | ICD-10-CM

## 2020-11-09 ENCOUNTER — Other Ambulatory Visit: Payer: Self-pay | Admitting: Neurology

## 2020-11-10 ENCOUNTER — Ambulatory Visit: Payer: No Typology Code available for payment source | Admitting: Psychiatry

## 2020-11-15 ENCOUNTER — Other Ambulatory Visit: Payer: Self-pay | Admitting: Internal Medicine

## 2020-11-16 ENCOUNTER — Other Ambulatory Visit: Payer: Self-pay | Admitting: Internal Medicine

## 2020-11-18 ENCOUNTER — Ambulatory Visit: Payer: No Typology Code available for payment source

## 2020-11-19 ENCOUNTER — Other Ambulatory Visit (HOSPITAL_COMMUNITY): Payer: Self-pay | Admitting: Neurology

## 2020-11-22 ENCOUNTER — Other Ambulatory Visit: Payer: Self-pay

## 2020-11-22 ENCOUNTER — Other Ambulatory Visit: Payer: Self-pay | Admitting: Neurology

## 2020-11-22 ENCOUNTER — Other Ambulatory Visit: Payer: Self-pay | Admitting: Physical Medicine and Rehabilitation

## 2020-11-22 ENCOUNTER — Ambulatory Visit: Payer: No Typology Code available for payment source | Attending: Physical Medicine and Rehabilitation

## 2020-11-22 VITALS — BP 131/84 | HR 87

## 2020-11-22 DIAGNOSIS — R278 Other lack of coordination: Secondary | ICD-10-CM | POA: Insufficient documentation

## 2020-11-22 DIAGNOSIS — M6281 Muscle weakness (generalized): Secondary | ICD-10-CM | POA: Insufficient documentation

## 2020-11-22 DIAGNOSIS — R2689 Other abnormalities of gait and mobility: Secondary | ICD-10-CM | POA: Diagnosis present

## 2020-11-22 DIAGNOSIS — I63511 Cerebral infarction due to unspecified occlusion or stenosis of right middle cerebral artery: Secondary | ICD-10-CM | POA: Insufficient documentation

## 2020-11-22 DIAGNOSIS — R269 Unspecified abnormalities of gait and mobility: Secondary | ICD-10-CM | POA: Insufficient documentation

## 2020-11-22 DIAGNOSIS — R29898 Other symptoms and signs involving the musculoskeletal system: Secondary | ICD-10-CM

## 2020-11-22 DIAGNOSIS — R262 Difficulty in walking, not elsewhere classified: Secondary | ICD-10-CM | POA: Insufficient documentation

## 2020-11-22 DIAGNOSIS — M542 Cervicalgia: Secondary | ICD-10-CM

## 2020-11-22 MED FILL — Lorazepam Tab 1 MG: ORAL | 1 days supply | Qty: 2 | Fill #0 | Status: AC

## 2020-11-23 ENCOUNTER — Ambulatory Visit: Payer: No Typology Code available for payment source | Admitting: Physical Therapy

## 2020-11-23 NOTE — Therapy (Signed)
[place] MAIN [facility] SERVICES [address] [place], [place], [address] Phone: [phone]   Fax:  [phone]  Physical Therapy Evaluation  Patient Details  Name: Kathryn Coffey MRN: [id number] Date of Birth: 11/28/1970 No data recorded  Encounter Date: 11/22/2020   PT End of Session - 11/23/20 0830    Visit Number 1    Number of Visits 25    Date for PT Re-Evaluation 02/14/21    Authorization Time Period Initial Cert= 5/0/0938- 1/82/9937    PT Start Time 1600    PT Stop Time 1657    PT Time Calculation (min) 57 min    Equipment Utilized During Treatment Gait belt    Activity Tolerance Patient tolerated treatment well;No increased pain    Behavior During Therapy WFL for tasks assessed/performed           Past Medical History:  Diagnosis Date  . Abdominal wall cellulitis 2013 and 2014  . Asthma    allergic to grasses  . B12 deficiency   . Complication of anesthesia   . Costochondritis   . Diabetes mellitus without complication ([place])   . Gastric anomaly    multiple small ulcers  . GERD (gastroesophageal reflux disease)   . Herpes 06/2018   POSSIBLY IN EYE- PT TAKING VALTREX AND WILL SEE OPTHAMOLOGIST TO SEE IF VALTREX IS WORKING  . History of abnormal cervical Pap smear   . History of Clostridium difficile colitis   . History of hiatal hernia   . History of kidney stones   . Hypertension   . IBS (irritable bowel syndrome)   . Migraine   . Morbid obesity ([place])    s/p attempted gastric banding now decompressed  . PCOS (polycystic ovarian syndrome)   . PONV (postoperative nausea and vomiting)    WITH SPINAL ONLY  . Stroke [facility])     Past Surgical History:  Procedure Laterality Date  . BREAST EXCISIONAL BIOPSY Left    cyst excision - Dr. Patty Sermons  . CESAREAN SECTION    . CHOLECYSTECTOMY    . COLONOSCOPY    . EAR CYST EXCISION Right 07/04/2018   Procedure: EXCISION GLOMUS TUMOR THUMBNAIL;  Surgeon: Corky Mull, MD;   Location: [facility] ORS;  Service: Orthopedics;  Laterality: Right;  . ESOPHAGOGASTRODUODENOSCOPY    . ESOPHAGOGASTRODUODENOSCOPY (EGD) WITH PROPOFOL N/A 12/06/2015   Procedure: ESOPHAGOGASTRODUODENOSCOPY (EGD) WITH PROPOFOL;  Surgeon: Manya Silvas, MD;  Location: [facility] ENDOSCOPY;  Service: Endoscopy;  Laterality: N/A;  . IR ANGIO INTRA EXTRACRAN SEL INTERNAL CAROTID UNI L MOD SED  07/15/2019  . IR ANGIO VERTEBRAL SEL SUBCLAVIAN INNOMINATE UNI R MOD SED  07/15/2019  . IR CT HEAD LTD  07/15/2019  . IR INTRAVSC STENT CERV CAROTID W/O EMB-PROT MOD SED INC ANGIO  07/15/2019  . IR PERCUTANEOUS ART THROMBECTOMY/INFUSION INTRACRANIAL INC DIAG ANGIO  07/15/2019  . LAPAROSCOPIC GASTRIC BANDING    . LAPAROSCOPIC GASTRIC RESTRICTIVE DUODENAL PROCEDURE (DUODENAL SWITCH) N/A 01/25/2016   Procedure: LAPAROSCOPIC GASTRIC RESTRICTIVE DUODENAL PROCEDURE (DUODENAL SWITCH);  Surgeon: Ladora Daniel, MD;  Location: [facility] ORS;  Service: General;  Laterality: N/A;  . OVARY SURGERY     x2  . RADIOLOGY WITH ANESTHESIA N/A 07/15/2019   Procedure: IR WITH ANESTHESIA;  Surgeon: Luanne Bras, MD;  Location: [place];  Service: Radiology;  Laterality: N/A;  . TONSILLECTOMY    . TUBAL LIGATION    . UMBILICAL HERNIA REPAIR N/A 01/25/2016   Procedure: LAPAROSCOPIC UMBILICAL HERNIA;  Surgeon:  Ladora Daniel, MD;  Location: [facility] ORS;  Service: General;  Laterality: N/A;  . 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 She reports ongoing deficits from CVA in 2020 - "I am better overall but just want to be stronger  and more stable."    Pertinent History 50 year old female with history of CVA on 07/15/2019 with left sided weakness. Patient has PMH significant for DM, GERD, HTN, Asthma, Herpes, Costochondritis, Vit B12 deficiency.    Limitations Lifting;Standing;Walking    How long can you sit comfortably? no restrictions    How long can you stand comfortably? Limited with prolonged standing - around 30 min    How long can you walk comfortably? around 30 min    Patient Stated Goals I want to improve my strength and increase my stability with standing/walking    Currently in Pain? Yes    Pain Score 4     Pain Location Knee    Pain Orientation Left    Pain Descriptors / Indicators Aching    Pain Type Chronic pain    Pain Onset Other (comment)   Chronic since 06/2019   Pain Frequency Intermittent    Aggravating Factors  prolonged standing/walking    Pain Relieving Factors Rest    Effect of Pain on Daily Activities Difficulty completitng tasks    Multiple Pain Sites No              [facility] PT Assessment - 11/22/20 1618      Assessment   Onset Date/Surgical Date 07/14/20    Hand Dominance Right    Next MD Visit 01/2021   Dr. Emily Filbert     Balance Screen   Has the patient fallen in the past 6 months No    Has the patient had a decrease in activity level because of a fear of falling?  No    Is the patient reluctant to leave their home because of a fear of falling?  Yes      [place] Private residence    Living Arrangements Spouse/significant other    Available Help at Discharge Family    Type of [place] to enter    Entrance Stairs-Number of Steps 3    Entrance Stairs-Rails Right;Left;Can reach both    Crisp - quad;Grab bars - toilet;Grab bars - tub/shower;Shower seat      Prior Function   Level of Independence Independent with basic ADLs    Vocation Part time employment      Cognition    Overall Cognitive Status Within Functional Limits for tasks assessed    Attention Sustained    Sustained Attention Appears intact    Memory Impaired    Memory Impairment Decreased short term memory    Problem Solving Appears intact      Observation/Other Assessments   Skin Integrity burn mark on left side anterior neck otherwise intact    Focus  on Therapeutic Outcomes (FOTO)  62              OBJECTIVE  MUSCULOSKELETAL: Tremor: Absent Bulk: Normal Tone: Normal, no clonus  Posture No gross abnormalities noted in standing or seated posture  Gait Decreased heel strike to toe off on left Left knee hyperextended in stance and externally rotated hip.  No assistive device- no loss of balance Gait speed= 0.97 m/s  Strength R/L 5/3+ Hip flexion 5/3+ Hip external rotation 5/3+ Hip internal rotation 5/4 Hip extension  5/3+ Hip abduction 5/4 Hip adduction 5/4 Knee extension 5/4 Knee flexion 5/5 Ankle Plantarflexion 5/4 Ankle Dorsiflexion   NEUROLOGICAL:  Mental Status Patient is oriented to person, place and time.  Recent memory is intact.  Short term memory is impaired per patient report Attention span and concentration are intact.  Expressive speech is intact.  Patient's fund of knowledge is within normal limits for educational level.  Sensation Grossly intact to light touch bilateral UEs/LEs as determined by testing dermatomes C2-T2/L2-S2 respectively Proprioception and hot/cold testing deferred on this date    FUNCTIONAL OUTCOME MEASURES   Results Comments      DGI 15/24       TUG  13.34 seconds   5TSTS  12 seconds No UE support      10 Meter Gait Speed Self-selected: s = 0.97 m/s Below normative values for full community ambulation     ASSESSMENT Clinical Impression: Pt is a pleasant 50 year-old female referred for Left LE muscle weakness. PT examination reveals deficits in strength, gait and balance. Patient presents with gait abnormalities from  previous CVA and increased risk of falling as seen in the DGI test.  Pt will benefit from skilled PT services to address deficits in balance and decrease risk for future falls.           Objective measurements completed on examination: See above findings.                 PT Short Term Goals - 11/23/20 0844      PT SHORT TERM GOAL #1   Title Pt will be independent with HEP in order to improve strength and balance in order to decrease fall risk and improve function at home and work.    Baseline 11/22/2020- Patient has no formal HEP in place.    Time 6    Period Weeks    Status New    Target Date 01/03/21             PT Long Term Goals - 11/23/20 0845      PT LONG TERM GOAL #1   Title Pt will improve DGI by at least 3 points in order to demonstrate clinically significant improvement in balance and decreased risk for falls.    Baseline 11/22/2020= DGI score= 15/24    Time 12    Period Weeks    Status New    Target Date 02/14/21      PT LONG TERM GOAL #2   Title Pt will decrease 5TSTS by at least 3 seconds in order to demonstrate clinically significant improvement in LE strength.    Baseline 11/22/2020= 12 sec    Time 12    Period Weeks    Status New    Target Date 02/14/21      PT LONG TERM GOAL #3   Title Pt will decrease TUG to below 11 seconds/decrease in order to demonstrate decreased fall risk.    Baseline 11/22/2020= 13.34 sec  without an AD    Time 18    Period Weeks    Status New    Target Date 02/14/21      PT LONG TERM GOAL #4   Title Patient will increase FOTO score to equal to or greater than 66  to demonstrate statistically significant improvement in mobility and quality of life.    Baseline 11/22/2020= FOTO= 62    Time 12    Period Weeks    Status New    Target Date 02/14/21      PT LONG TERM GOAL #5   Title Patient will report a worst pain of 3/10 on VAS (Left knee)  to improve tolerance with ADLs and reduced symptoms with activities.     Baseline 11/22/2020- Patient reports 4/10 left Lateral knee pain- worse with standing/walking    Time 12    Period Weeks    Status New    Target Date 02/14/21                  Plan - 11/23/20 2202    Clinical Impression Statement Pt is a pleasant 50 year-old female referred for Left LE muscle weakness. PT examination reveals deficits in strength, gait and balance. Patient presents with gait abnormalities from previous CVA and increased risk of falling as seen in the DGI test.  Pt will benefit from skilled PT services to address deficits in balance and decrease risk for future falls.    Personal Factors and Comorbidities Comorbidity 2    Comorbidities Diabetes, CVA    Examination-Activity Limitations Carry;Lift;Transfers    Examination-Participation Restrictions Occupation;Cleaning;Community Activity;Shop;Yard Work    [employer] Stable/Uncomplicated    Clinical Decision Making High    Rehab Potential Good    PT Frequency 2x / week    PT Duration 12 weeks    PT Treatment/Interventions ADLs/Self Care Home Management;Biofeedback;Electrical Stimulation;Moist Heat;DME Instruction;Gait training;Stair training;Functional mobility training;Therapeutic exercise;Balance training;Neuromuscular re-education;Therapeutic activities;Patient/family education;Manual techniques;Passive range of motion;Dry needling    PT Next Visit Plan Initiate LE Strengthening and balance activities    PT Home Exercise Plan To be initiated next 1-2 visits.    Recommended Other Services OT    Consulted and Agree with Plan of Care Patient           Patient will benefit from skilled therapeutic intervention in order to improve the following deficits and impairments:  Abnormal gait,Decreased activity tolerance,Decreased coordination,Decreased endurance,Decreased mobility,Decreased strength,Impaired tone,Impaired UE functional use,Pain  Visit Diagnosis: Muscle weakness (generalized)  Other  lack of coordination  Difficulty in walking, not elsewhere classified     Problem List Patient Active Problem List   Diagnosis Date Noted  . E. coli UTI   . Diabetes mellitus type 2 in obese ([place])   . Chronic pain of right knee   . 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 Hemiparesis affecting left side as late effect of stroke ([place])   . Acute ischemic right middle cerebral artery (MCA) stroke ([place]) 07/22/2019  . Carotid artery dissection  (H[facility]) s/p stent placement 07/21/2019  . Diabetes mellitus type II, uncontrolled ([place]) 07/21/2019  . Acute blood loss anemia 07/21/2019  . Leukocytosis 07/21/2019  . Stroke (cerebrum) ([place]) - R MCA infarct s/p tenecteplase and mechanical thrombectomy w/ d/t ICA dissection  07/15/2019  . Middle cerebral artery embolism, right 07/15/2019  . Controlled type 2 diabetes mellitus without complication, without long-term current use of insulin ([place]) 05/28/2017  . Attention deficit hyperactivity disorder (ADHD), combined type 04/18/2017  .  Morbid obesity ([place]) 01/25/2016  . Umbilical hernia 47/39/5844  . Hiatal hernia 12/08/2015  . History of Clostridium difficile colitis 06/19/2014  . HTN (hypertension) 12/23/2013  . PCOS (polycystic ovarian syndrome) 12/23/2013  . Bariatric surgery status 01/28/2013    Lewis Moccasin, PT 11/23/2020, 9:24 AM  [place] MAIN [facility] SERVICES [address] [place], [place], [address] Phone: [phone]   Fax:  [phone]  Name: DORELLA LASTER MRN: [id number] Date of Birth: 23-Sep-1970

## 2020-11-25 ENCOUNTER — Ambulatory Visit: Payer: No Typology Code available for payment source | Admitting: Psychiatry

## 2020-11-30 ENCOUNTER — Ambulatory Visit: Payer: No Typology Code available for payment source

## 2020-11-30 ENCOUNTER — Other Ambulatory Visit: Payer: Self-pay

## 2020-11-30 ENCOUNTER — Ambulatory Visit: Payer: No Typology Code available for payment source | Admitting: Psychiatry

## 2020-11-30 DIAGNOSIS — M6281 Muscle weakness (generalized): Secondary | ICD-10-CM

## 2020-11-30 DIAGNOSIS — R262 Difficulty in walking, not elsewhere classified: Secondary | ICD-10-CM

## 2020-11-30 DIAGNOSIS — R269 Unspecified abnormalities of gait and mobility: Secondary | ICD-10-CM

## 2020-11-30 NOTE — Therapy (Signed)
[place] MAIN [facility] SERVICES [address] [place], [place], [address] Phone: [phone]   Fax:  [phone]  Physical Therapy Treatment  Patient Details  Name: Kathryn Coffey MRN: [id number] Date of Birth: [birth year] No data recorded  Encounter Date: 11/30/2020   PT End of Session - 11/30/20 1340    Visit Number 2    Number of Visits 25    Date for PT Re-Evaluation 02/14/21    Authorization Time Period Initial Cert= 03/22/9936- 1/69/6789    PT Start Time 1335    PT Stop Time 1412    PT Time Calculation (min) 37 min    Equipment Utilized During Treatment Gait belt    Activity Tolerance Patient tolerated treatment well;No increased pain    Behavior During Therapy WFL for tasks assessed/performed           Past Medical History:  Diagnosis Date  . Abdominal wall cellulitis 2013 and 2014  . Asthma    allergic to grasses  . B12 deficiency   . Complication of anesthesia   . Costochondritis   . Diabetes mellitus without complication ([place])   . Gastric anomaly    multiple small ulcers  . GERD (gastroesophageal reflux disease)   . Herpes 06/2018   POSSIBLY IN EYE- PT TAKING VALTREX AND WILL SEE OPTHAMOLOGIST TO SEE IF VALTREX IS WORKING  . History of abnormal cervical Pap smear   . History of Clostridium difficile colitis   . History of hiatal hernia   . History of kidney stones   . Hypertension   . IBS (irritable bowel syndrome)   . Migraine   . Morbid obesity ([place])    s/p attempted gastric banding now decompressed  . PCOS (polycystic ovarian syndrome)   . PONV (postoperative nausea and vomiting)    WITH SPINAL ONLY  . Stroke [facility])     Past Surgical History:  Procedure Laterality Date  . BREAST EXCISIONAL BIOPSY Left    cyst excision - Dr. Patty Sermons  . CESAREAN SECTION    . CHOLECYSTECTOMY    . COLONOSCOPY    . EAR CYST EXCISION Right 07/04/2018   Procedure: EXCISION GLOMUS TUMOR THUMBNAIL;  Surgeon: Corky Mull, MD;   Location: [facility] ORS;  Service: Orthopedics;  Laterality: Right;  . ESOPHAGOGASTRODUODENOSCOPY    . ESOPHAGOGASTRODUODENOSCOPY (EGD) WITH PROPOFOL N/A 12/06/2015   Procedure: ESOPHAGOGASTRODUODENOSCOPY (EGD) WITH PROPOFOL;  Surgeon: Manya Silvas, MD;  Location[facility] ENDOSCOPY;  Service: Endoscopy;  Laterality: N/A;  . IR ANGIO INTRA EXTRACRAN SEL INTERNAL CAROTID UNI L MOD SED  07/15/2019  . IR ANGIO VERTEBRAL SEL SUBCLAVIAN INNOMINATE UNI R MOD SED  07/15/2019  . IR CT HEAD LTD  07/15/2019  . IR INTRAVSC STENT CERV CAROTID W/O EMB-PROT MOD SED INC ANGIO  07/15/2019  . IR PERCUTANEOUS ART THROMBECTOMY/INFUSION INTRACRANIAL INC DIAG ANGIO  07/15/2019  . LAPAROSCOPIC GASTRIC BANDING    . LAPAROSCOPIC GASTRIC RESTRICTIVE DUODENAL PROCEDURE (DUODENAL SWITCH) N/A 01/25/2016   Procedure: LAPAROSCOPIC GASTRIC RESTRICTIVE DUODENAL PROCEDURE (DUODENAL SWITCH);  Surgeon: Ladora Daniel, MD;  Location: [facility] ORS;  Service: General;  Laterality: N/A;  . OVARY SURGERY     x2  . RADIOLOGY WITH ANESTHESIA N/A 07/15/2019   Procedure: IR WITH ANESTHESIA;  Surgeon: Luanne Bras, MD;  Location: [place];  Service: Radiology;  Laterality: N/A;  . TONSILLECTOMY    . TUBAL LIGATION    . UMBILICAL HERNIA REPAIR N/A 01/25/2016   Procedure: LAPAROSCOPIC UMBILICAL HERNIA;  Surgeon:  Ladora Daniel, MD;  Location: [facility] ORS;  Service: General;  Laterality: N/A;  . UMBILICAL HERNIA REPAIR N/A 10/20/2016   Procedure: HERNIA REPAIR UMBILICAL ADULT;  Surgeon: Leonie Green, MD;  Location: [facility] ORS;  Service: General;  Laterality: N/A;    There were no vitals filed for this visit.   Subjective Assessment - 11/30/20 1338    Subjective Patient reports no new complaints. Denies any pain or falls.    Pertinent History 50 year old female with history of CVA on 07/15/2019 with left sided weakness. Patient has PMH significant for DM, GERD, HTN, Asthma, Herpes, Costochondritis, Vit B12 deficiency.    Limitations  Lifting;Standing;Walking    How long can you sit comfortably? no restrictions    How long can you stand comfortably? Limited with prolonged standing - around 30 min    How long can you walk comfortably? around 30 min    Patient Stated Goals I want to improve my strength and increase my stability with standing/walking    Currently in Pain? No/denies    Pain Onset Other (comment)   Chronic since 06/2019         Interventions: Patient was instructed in the following LE strengthening activities today:  Right sidelye  clamshell - Left LE with GTB 2 x 12 reps- Patient reported "burning in hip" but no difficulty completing with good ROM and able to tolerate use of band well today.   Standing Hip abd with GTB B LE 2sets of 10 reps  Step up  With BUE support- x 10 reps alt LE's- mild unsteadiness and hyperextension noted on left- able to improve with repetitions- min VC's.  Step down - focusing on eccentric control of left LE- able to stand down with right and maintain good control with VC/TCs today.   TKE with matrix cable system- 7.5 lb on Left LE- 2 sets of 10 reps with good control and able to perform without locking Left LE or hyperextending.  Resisted ankle DF- Instructed in use of GTB- 2 sets of 12 reps focusing on 2nd set with eccentric control.  Seated PF 2 sets of 10 reps.  Sit to stand with eccentric control- 10 reps with good form and only min VC to slow down.  Sit to stand with hip add (ball squeeze) x 7 reps- good ability to dual task maintaining good squeeze while performing sit to stand.  Education provided throughout session via VC/TC and demonstration to facilitate movement at target joints and correct muscle activation for all testing and exercises performed.   Clinical Impression: Patient performed well overall today with addition of LE strengthening exercises and able to perform some resistive training and utilize eccentric control with minimal VC/TC today. Pt will benefit  from skilled PT services to address deficits in balance and decrease risk for future falls.                         PT Education - 11/30/20 1708    Education Details specific exercise technique    Person(s) Educated Patient    Methods Explanation;Demonstration;Tactile cues;Verbal cues    Comprehension Verbalized understanding;Returned demonstration;Verbal cues required;Tactile cues required;Need further instruction            PT Short Term Goals - 11/23/20 0844      PT SHORT TERM GOAL #1   Title Pt will be independent with HEP in order to improve strength and balance in order to decrease fall risk and  improve function at home and work.    Baseline 11/22/2020- Patient has no formal HEP in place.    Time 6    Period Weeks    Status New    Target Date 01/03/21             PT Long Term Goals - 11/23/20 0845      PT LONG TERM GOAL #1   Title Pt will improve DGI by at least 3 points in order to demonstrate clinically significant improvement in balance and decreased risk for falls.    Baseline 11/22/2020= DGI score= 15/24    Time 12    Period Weeks    Status New    Target Date 02/14/21      PT LONG TERM GOAL #2   Title Pt will decrease 5TSTS by at least 3 seconds in order to demonstrate clinically significant improvement in LE strength.    Baseline 11/22/2020= 12 sec    Time 12    Period Weeks    Status New    Target Date 02/14/21      PT LONG TERM GOAL #3   Title Pt will decrease TUG to below 11 seconds/decrease in order to demonstrate decreased fall risk.    Baseline 11/22/2020= 13.34 sec without an AD    Time 12    Period Weeks    Status New    Target Date 02/14/21      PT LONG TERM GOAL #4   Title Patient will increase FOTO score to equal to or greater than 66  to demonstrate statistically significant improvement in mobility and quality of life.    Baseline 11/22/2020= FOTO= 62    Time 12    Period Weeks    Status New    Target Date 02/14/21       PT LONG TERM GOAL #5   Title Patient will report a worst pain of 3/10 on VAS (Left knee)  to improve tolerance with ADLs and reduced symptoms with activities.    Baseline 11/22/2020- Patient reports 4/10 left Lateral knee pain- worse with standing/walking    Time 12    Period Weeks    Status New    Target Date 02/14/21                 Plan - 11/30/20 1710    Clinical Impression Statement Patient performed well overall today with addition of LE strengthening exercises and able to perform some resistive training and utilize eccentric control with minimal VC/TC today. Pt will benefit from skilled PT services to address deficits in balance and decrease risk for future falls.    Personal Factors and Comorbidities Comorbidity 2    Comorbidities Diabetes, CVA    Examination-Activity Limitations Carry;Lift;Transfers    Examination-Participation Restrictions Occupation;Cleaning;Community Activity;Shop;Yard Work    Stability/Clinical Decision Making Stable/Uncomplicated    Rehab Potential Good    PT Frequency 2x / week    PT Duration 12 weeks    PT Treatment/Interventions ADLs/Self Care Home Management;Biofeedback;Electrical Stimulation;Moist Heat;DME Instruction;Gait training;Stair training;Functional mobility training;Therapeutic exercise;Balance training;Neuromuscular re-education;Therapeutic activities;Patient/family education;Manual techniques;Passive range of motion;Dry needling    PT Next Visit Plan Initiate LE Strengthening and balance activities    PT Home Exercise Plan To be initiated next 1-2 visits.    Consulted and Agree with Plan of Care Patient           Patient will benefit from skilled therapeutic intervention in order to improve the following deficits and impairments:  Abnormal gait,Decreased activity  tolerance,Decreased coordination,Decreased endurance,Decreased mobility,Decreased strength,Impaired tone,Impaired UE functional use,Pain  Visit Diagnosis: Abnormality of  gait and mobility  Difficulty in walking, not elsewhere classified  Muscle weakness (generalized)     Problem List Patient Active Problem List   Diagnosis Date Noted  . E. coli UTI   . Diabetes mellitus type 2 in obese ([place])   . Chronic pain of right knee   . 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 Hemiparesis affecting left side as late effect of stroke ([place])   . Acute ischemic right middle cerebral artery (MCA) stroke ([place]) 07/22/2019  . Carotid artery dissection  ([facility]) s/p stent placement 07/21/2019  . Diabetes mellitus type II, uncontrolled ([place]) 07/21/2019  . Acute blood loss anemia 07/21/2019  . Leukocytosis 07/21/2019  . Stroke (cerebrum) ([place]) - R MCA infarct s/p tenecteplase and mechanical thrombectomy w/ d/t ICA dissection  07/15/2019  . Middle cerebral artery embolism, right 07/15/2019  . Controlled type 2 diabetes mellitus without complication, without long-term current use of insulin ([place]) 05/28/2017  . Attention deficit hyperactivity disorder (ADHD), combined type 04/18/2017  . Morbid obesity ([place]) 01/25/2016  . Umbilical hernia 93/73/4287  . Hiatal hernia 12/08/2015  . History of Clostridium difficile colitis 06/19/2014  . HTN (hypertension) 12/23/2013  . PCOS (polycystic ovarian syndrome) 12/23/2013  . Bariatric surgery status 01/28/2013    Lewis Moccasin, PT 11/30/2020, 5:21 PM  [place] MAIN [facility] SERVICES [address] [place], [place], [address] Phone: [phone]   Fax:  [phone]  Name: Kathryn Coffey MRN: [id number] Date of Birth: 1971/06/22

## 2020-12-01 ENCOUNTER — Other Ambulatory Visit: Payer: Self-pay

## 2020-12-01 MED FILL — Triamterene & Hydrochlorothiazide Cap 37.5-25 MG: ORAL | 30 days supply | Qty: 30 | Fill #0 | Status: AC

## 2020-12-01 MED FILL — Mirabegron Tab ER 24 HR 50 MG: ORAL | 30 days supply | Qty: 30 | Fill #0 | Status: AC

## 2020-12-02 ENCOUNTER — Other Ambulatory Visit: Payer: Self-pay

## 2020-12-02 ENCOUNTER — Ambulatory Visit
Admission: RE | Admit: 2020-12-02 | Discharge: 2020-12-02 | Disposition: A | Discharge status: Home / Self Care | Payer: No Typology Code available for payment source | Source: Ambulatory Visit | Attending: Neurology | Admitting: Neurology

## 2020-12-02 DIAGNOSIS — M542 Cervicalgia: Secondary | ICD-10-CM | POA: Insufficient documentation

## 2020-12-06 ENCOUNTER — Ambulatory Visit: Payer: No Typology Code available for payment source | Admitting: Occupational Therapy

## 2020-12-06 ENCOUNTER — Other Ambulatory Visit: Payer: Self-pay

## 2020-12-06 ENCOUNTER — Encounter: Payer: Self-pay | Admitting: Occupational Therapy

## 2020-12-06 DIAGNOSIS — R278 Other lack of coordination: Secondary | ICD-10-CM

## 2020-12-06 DIAGNOSIS — M6281 Muscle weakness (generalized): Secondary | ICD-10-CM

## 2020-12-06 DIAGNOSIS — I63511 Cerebral infarction due to unspecified occlusion or stenosis of right middle cerebral artery: Secondary | ICD-10-CM

## 2020-12-06 MED FILL — Losartan Potassium & Hydrochlorothiazide Tab 100-12.5 MG: ORAL | 30 days supply | Qty: 30 | Fill #0 | Status: AC
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
## 2020-12-07 ENCOUNTER — Ambulatory Visit: Payer: No Typology Code available for payment source | Admitting: Psychiatry

## 2020-12-07 ENCOUNTER — Other Ambulatory Visit: Payer: Self-pay

## 2020-12-07 MED ORDER — VYVANSE 20 MG PO CAPS
20.0000 mg | ORAL_CAPSULE | Freq: Every morning | ORAL | 0 refills | Status: DC
  Filled 2020-12-07: qty 30, 30d supply, fill #0

## 2020-12-08 ENCOUNTER — Ambulatory Visit: Payer: No Typology Code available for payment source | Admitting: Occupational Therapy

## 2020-12-08 ENCOUNTER — Other Ambulatory Visit: Payer: Self-pay

## 2020-12-08 ENCOUNTER — Ambulatory Visit: Payer: No Typology Code available for payment source | Admitting: Physical Therapy

## 2020-12-08 ENCOUNTER — Encounter: Payer: Self-pay | Admitting: Physical Therapy

## 2020-12-08 DIAGNOSIS — M6281 Muscle weakness (generalized): Secondary | ICD-10-CM

## 2020-12-08 DIAGNOSIS — R278 Other lack of coordination: Secondary | ICD-10-CM

## 2020-12-08 DIAGNOSIS — R262 Difficulty in walking, not elsewhere classified: Secondary | ICD-10-CM

## 2020-12-08 DIAGNOSIS — R2689 Other abnormalities of gait and mobility: Secondary | ICD-10-CM

## 2020-12-08 NOTE — Patient Instructions (Signed)
Access Code: [email or web address] URL: [email or web address] Date: 12/08/2020 Prepared by: Blanche East  Exercises Seated Piriformis Stretch with Trunk Bend - 1 x daily - 7 x weekly - 1 sets - 2-3 reps - 30 sec hold Single Leg Stance with Support - 1 x daily - 7 x weekly - 1 sets - 2-3 reps - 30 sec hold hold Standing with Feet Together on Foam Pad (BKA) - 1 x daily - 7 x weekly - 1 sets - 2-3 reps - 30 sec hold

## 2020-12-08 NOTE — Therapy (Addendum)
[place] MAIN [facility] SERVICES [address] [place], [place], [address] Phone: [phone]   Fax:  [phone]  Physical Therapy Treatment  Patient Details  Name: Kathryn Coffey MRN: [id number] Date of Birth: 1971-02-15 No data recorded  Encounter Date: 12/08/2020   PT End of Session - 12/08/20 1135    Visit Number 3    Number of Visits 25    Date for PT Re-Evaluation 02/14/21    Authorization Time Period Initial Cert= 10/25/1060- 6/94/8546    PT Start Time 1146    PT Stop Time 1230    PT Time Calculation (min) 44 min    Equipment Utilized During Treatment Gait belt    Activity Tolerance Patient tolerated treatment well;No increased pain    Behavior During Therapy WFL for tasks assessed/performed           Past Medical History:  Diagnosis Date  . Abdominal wall cellulitis 2013 and 2014  . Asthma    allergic to grasses  . B12 deficiency   . Complication of anesthesia   . Costochondritis   . Diabetes mellitus without complication ([place])   . Gastric anomaly    multiple small ulcers  . GERD (gastroesophageal reflux disease)   . Herpes 06/2018   POSSIBLY IN EYE- PT TAKING VALTREX AND WILL SEE OPTHAMOLOGIST TO SEE IF VALTREX IS WORKING  . History of abnormal cervical Pap smear   . History of Clostridium difficile colitis   . History of hiatal hernia   . History of kidney stones   . Hypertension   . IBS (irritable bowel syndrome)   . Migraine   . Morbid obesity ([place])    s/p attempted gastric banding now decompressed  . PCOS (polycystic ovarian syndrome)   . PONV (postoperative nausea and vomiting)    WITH SPINAL ONLY  . Stroke [facility])     Past Surgical History:  Procedure Laterality Date  . BREAST EXCISIONAL BIOPSY Left    cyst excision - Dr. Patty Sermons  . CESAREAN SECTION    . CHOLECYSTECTOMY    . COLONOSCOPY    . EAR CYST EXCISION Right 07/04/2018   Procedure: EXCISION GLOMUS TUMOR THUMBNAIL;  Surgeon: Corky Mull, MD;   Location: [facility] ORS;  Service: Orthopedics;  Laterality: Right;  . ESOPHAGOGASTRODUODENOSCOPY    . ESOPHAGOGASTRODUODENOSCOPY (EGD) WITH PROPOFOL N/A 12/06/2015   Procedure: ESOPHAGOGASTRODUODENOSCOPY (EGD) WITH PROPOFOL;  Surgeon: Manya Silvas, MD;  Location: [facility] ENDOSCOPY;  Service: Endoscopy;  Laterality: N/A;  . IR ANGIO INTRA EXTRACRAN SEL INTERNAL CAROTID UNI L MOD SED  07/15/2019  . IR ANGIO VERTEBRAL SEL SUBCLAVIAN INNOMINATE UNI R MOD SED  07/15/2019  . IR CT HEAD LTD  07/15/2019  . IR INTRAVSC STENT CERV CAROTID W/O EMB-PROT MOD SED INC ANGIO  07/15/2019  . IR PERCUTANEOUS ART THROMBECTOMY/INFUSION INTRACRANIAL INC DIAG ANGIO  07/15/2019  . LAPAROSCOPIC GASTRIC BANDING    . LAPAROSCOPIC GASTRIC RESTRICTIVE DUODENAL PROCEDURE (DUODENAL SWITCH) N/A 01/25/2016   Procedure: LAPAROSCOPIC GASTRIC RESTRICTIVE DUODENAL PROCEDURE (DUODENAL SWITCH);  Surgeon: Ladora Daniel, MD;  Location: [facility] ORS;  Service: General;  Laterality: N/A;  . OVARY SURGERY     x2  . RADIOLOGY WITH ANESTHESIA N/A 07/15/2019   Procedure: IR WITH ANESTHESIA;  Surgeon: Luanne Bras, MD;  Location: [place];  Service: Radiology;  Laterality: N/A;  . TONSILLECTOMY    . TUBAL LIGATION    . UMBILICAL HERNIA REPAIR N/A 01/25/2016   Procedure: LAPAROSCOPIC UMBILICAL HERNIA;  Surgeon:  Ladora Daniel, MD;  Location: [facility] ORS;  Service: General;  Laterality: N/A;  . UMBILICAL HERNIA REPAIR N/A 10/20/2016   Procedure: HERNIA REPAIR UMBILICAL ADULT;  Surgeon: Leonie Green, MD;  Location: [facility] ORS;  Service: General;  Laterality: N/A;    There were no vitals filed for this visit.   Subjective Assessment - 12/08/20 1144    Subjective Patient reports some soreness after last session; She reports not feeling sore during the exercise but then a day later she was sore. She denies any sorenss currently;    Patient is accompained by: Family member   husband, Ray   Pertinent History 50 year old female with history of CVA  on 07/15/2019 with left sided weakness. Patient has PMH significant for DM, GERD, HTN, Asthma, Herpes, Costochondritis, Vit B12 deficiency.    Limitations Lifting;Standing;Walking    How long can you sit comfortably? no restrictions    How long can you stand comfortably? Limited with prolonged standing - around 30 min    How long can you walk comfortably? around 30 min    Patient Stated Goals I want to improve my strength and increase my stability with standing/walking    Currently in Pain? No/denies    Pain Onset Other (comment)   Chronic since 06/2019              Interventions:  Patient was instructed in the following LE strengthening activities today:    Standing Hip abd with GTB B LE 1 sets of 10 reps Side stepping with green tband x10 feet x2 laps each, unsupported with cues to keep toes oriented forward and reduce hip ER for better abductor strengthening -patient reports increased burning in BLE hips following side stepping;   Seated piriformis figure 4 stretch 30 sec hold x2 reps each LE;      Step up on 4 inch step with airex pad to challenge strength and stance control,  With 2-1 rail support- x 10 reps each LE- mild unsteadiness and hyperextension noted on left- able to improve with repetitions- min VC's.  Forward lunges on airex pad with 1 rail assist with mod Vcs for positioning and safety x5 reps each LE; Patient had difficulty controlling eccentric lower with LLE due to weakness;   NMR:  Standing on foam surface: -BLE heel raises x10 reps; Progressed to   Standing single leg heel raises x10 reps x1 set on RLE, intermittent HHA Unable to complete on LLE due to weakness/instability  Standing on airex: LLE SLS 30 sec hold x2 reps with intermittent HHA BLE feet together:  Cone passes #6 RUE side/side  Head turns side/side x5 reps  Head turns up/down x5 reps Modified tandem stance (staggered) Lateral head turns x10 each direction Patient required CGA to close  supervision with min to mod VCs to improve foot/ankle control and reduce ankle EV for better stability in stance. Patient exhibits increased instability with lateral sway noted with head turns with narrow base of support;   Ex:  Seated Hamstring curl, green tband 2x15 LLE only with cues to increase ROM and slow down LE movement for better motor control;   Sit to stand with airex pad under RLE to facilitate increased weight bearing in LLE x5 reps; Patient continues to lean to RLE due to habit and decreased LLE knee stability;    Advanced HEP- see patient instructions;   Tolerated session well. She does report mild fatigue  At end of session. Reinforced importance of LE stretches to  reduce delayed onset muscle soreness.                        PT Education - 12/08/20 1135    Education Details LE strengthening, HEP    Person(s) Educated Patient    Methods Explanation;Verbal cues    Comprehension Verbalized understanding;Returned demonstration;Verbal cues required;Need further instruction            PT Short Term Goals - 11/23/20 0844      PT SHORT TERM GOAL #1   Title Pt will be independent with HEP in order to improve strength and balance in order to decrease fall risk and improve function at home and work.    Baseline 11/22/2020- Patient has no formal HEP in place.    Time 6    Period Weeks    Status New    Target Date 01/03/21             PT Long Term Goals - 11/23/20 0845      PT LONG TERM GOAL #1   Title Pt will improve DGI by at least 3 points in order to demonstrate clinically significant improvement in balance and decreased risk for falls.    Baseline 11/22/2020= DGI score= 15/24    Time 12    Period Weeks    Status New    Target Date 02/14/21      PT LONG TERM GOAL #2   Title Pt will decrease 5TSTS by at least 3 seconds in order to demonstrate clinically significant improvement in LE strength.    Baseline 11/22/2020= 12 sec    Time 12    Period  Weeks    Status New    Target Date 02/14/21      PT LONG TERM GOAL #3   Title Pt will decrease TUG to below 11 seconds/decrease in order to demonstrate decreased fall risk.    Baseline 11/22/2020= 13.34 sec without an AD    Time 12    Period Weeks    Status New    Target Date 02/14/21      PT LONG TERM GOAL #4   Title Patient will increase FOTO score to equal to or greater than 66  to demonstrate statistically significant improvement in mobility and quality of life.    Baseline 11/22/2020= FOTO= 62    Time 12    Period Weeks    Status New    Target Date 02/14/21      PT LONG TERM GOAL #5   Title Patient will report a worst pain of 3/10 on VAS (Left knee)  to improve tolerance with ADLs and reduced symptoms with activities.    Baseline 11/22/2020- Patient reports 4/10 left Lateral knee pain- worse with standing/walking    Time 12    Period Weeks    Status New    Target Date 02/14/21                 Plan - 12/08/20 1232    Clinical Impression Statement Patient tolerated session well. She was instructed in advanced LE strengthening. Instructed patient in LE stretches and provided seated rest breaks to reduce delayed onset muscle soreness. Patient does require min VCs for proper positioning and exercise technique for optimal muscle activation. She continues to have poor knee stability and decreased ankle control. Instructed patient in static standing exercise to facilitate better joint proprioception and motor control. Progressed HEP, see patient instructions. Patient would benefit from additional skilled PT  intervention to improve strength, balance and mobility;    Personal Factors and Comorbidities Comorbidity 2    Comorbidities Diabetes, CVA    Examination-Activity Limitations Carry;Lift;Transfers    Examination-Participation Restrictions Occupation;Cleaning;Community Activity;Shop;Yard Work    Stability/Clinical Decision Making Stable/Uncomplicated    Rehab Potential Good    PT  Frequency 2x / week    PT Duration 12 weeks    PT Treatment/Interventions ADLs/Self Care Home Management;Biofeedback;Electrical Stimulation;Moist Heat;DME Instruction;Gait training;Stair training;Functional mobility training;Therapeutic exercise;Balance training;Neuromuscular re-education;Therapeutic activities;Patient/family education;Manual techniques;Passive range of motion;Dry needling    PT Next Visit Plan Initiate LE Strengthening and balance activities    PT Home Exercise Plan To be initiated next 1-2 visits.    Consulted and Agree with Plan of Care Patient           Patient will benefit from skilled therapeutic intervention in order to improve the following deficits and impairments:  Abnormal gait,Decreased activity tolerance,Decreased coordination,Decreased endurance,Decreased mobility,Decreased strength,Impaired tone,Impaired UE functional use,Pain  Visit Diagnosis: Other lack of coordination  Muscle weakness (generalized)  Difficulty in walking, not elsewhere classified  Other abnormalities of gait and mobility     Problem List Patient Active Problem List   Diagnosis Date Noted  . E. coli UTI   . Diabetes mellitus type 2 in obese ([place])   . Chronic pain of right knee   . 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 Hemiparesis affecting left side as late effect of stroke ([place])   . Acute ischemic right middle cerebral artery (MCA) stroke ([place]) 07/22/2019  . Carotid artery dissection  (HCC) s/p stent placement 07/21/2019  . Diabetes mellitus type II, uncontrolled ([place]) 07/21/2019  . Acute blood loss anemia 07/21/2019  . Leukocytosis 07/21/2019  . Stroke (cerebrum) ([place]) - R MCA infarct s/p tenecteplase and mechanical thrombectomy w/ d/t ICA dissection  07/15/2019  . Middle cerebral artery embolism, right 07/15/2019  . Controlled type 2 diabetes mellitus without complication, without long-term current use of insulin ([place]) 05/28/2017  . Attention deficit  hyperactivity disorder (ADHD), combined type 04/18/2017  . Morbid obesity ([place]) 01/25/2016  . Umbilical hernia 73/71/0626  . Hiatal hernia 12/08/2015  . History of Clostridium difficile colitis 06/19/2014  . HTN (hypertension) 12/23/2013  . PCOS (polycystic ovarian syndrome) 12/23/2013  . Bariatric surgery status 01/28/2013    Myrna Vonseggern PT, DPT 12/08/2020, 12:37 PM  [place] MAIN [facility] SERVICES [address] [place], [place], [address] Phone: [phone]   Fax:  [phone]  Name: CAMBELLE SUCHECKI MRN: [id number] Date of Birth: Apr 29, 1971

## 2020-12-08 NOTE — Therapy (Signed)
[place] MAIN [facility] SERVICES [address] [place], [place], [address] Phone: [phone]   Fax:  [phone]  Occupational Therapy Treatment  Patient Details  Name: Kathryn Coffey MRN: [id number] Date of Birth: 1970/10/29 No data recorded  Encounter Date: 12/08/2020   OT End of Session - 12/08/20 1123    Visit Number 2    Number of Visits 25    Date for OT Re-Evaluation 02/28/21    Authorization Type Progress report period starting  on 12/06/20    OT Start Time 1106    OT Stop Time 1145    OT Time Calculation (min) 39 min    Activity Tolerance Patient tolerated treatment well    Behavior During Therapy [facility] for tasks assessed/performed           Past Medical History:  Diagnosis Date  . Abdominal wall cellulitis 2013 and 2014  . Asthma    allergic to grasses  . B12 deficiency   . Complication of anesthesia   . Costochondritis   . Diabetes mellitus without complication ([place])   . Gastric anomaly    multiple small ulcers  . GERD (gastroesophageal reflux disease)   . Herpes 06/2018   POSSIBLY IN EYE- PT TAKING VALTREX AND WILL SEE OPTHAMOLOGIST TO SEE IF VALTREX IS WORKING  . History of abnormal cervical Pap smear   . History of Clostridium difficile colitis   . History of hiatal hernia   . History of kidney stones   . Hypertension   . IBS (irritable bowel syndrome)   . Migraine   . Morbid obesity ([place])    s/p attempted gastric banding now decompressed  . PCOS (polycystic ovarian syndrome)   . PONV (postoperative nausea and vomiting)    WITH SPINAL ONLY  . Stroke [facility])     Past Surgical History:  Procedure Laterality Date  . BREAST EXCISIONAL BIOPSY Left    cyst excision - Dr. Patty Sermons  . CESAREAN SECTION    . CHOLECYSTECTOMY    . COLONOSCOPY    . EAR CYST EXCISION Right 07/04/2018   Procedure: EXCISION GLOMUS TUMOR THUMBNAIL;  Surgeon: Corky Mull, MD;  Location: [facility] ORS;  Service: Orthopedics;  Laterality:  Right;  . ESOPHAGOGASTRODUODENOSCOPY    . ESOPHAGOGASTRODUODENOSCOPY (EGD) WITH PROPOFOL N/A 12/06/2015   Procedure: ESOPHAGOGASTRODUODENOSCOPY (EGD) WITH PROPOFOL;  Surgeon: Manya Silvas, MD;  Location: [facility] ENDOSCOPY;  Service: Endoscopy;  Laterality: N/A;  . IR ANGIO INTRA EXTRACRAN SEL INTERNAL CAROTID UNI L MOD SED  07/15/2019  . IR ANGIO VERTEBRAL SEL SUBCLAVIAN INNOMINATE UNI R MOD SED  07/15/2019  . IR CT HEAD LTD  07/15/2019  . IR INTRAVSC STENT CERV CAROTID W/O EMB-PROT MOD SED INC ANGIO  07/15/2019  . IR PERCUTANEOUS ART THROMBECTOMY/INFUSION INTRACRANIAL INC DIAG ANGIO  07/15/2019  . LAPAROSCOPIC GASTRIC BANDING    . LAPAROSCOPIC GASTRIC RESTRICTIVE DUODENAL PROCEDURE (DUODENAL SWITCH) N/A 01/25/2016   Procedure: LAPAROSCOPIC GASTRIC RESTRICTIVE DUODENAL PROCEDURE (DUODENAL SWITCH);  Surgeon: Ladora Daniel, MD;  Location: [facility] ORS;  Service: General;  Laterality: N/A;  . OVARY SURGERY     x2  . RADIOLOGY WITH ANESTHESIA N/A 07/15/2019   Procedure: IR WITH ANESTHESIA;  Surgeon: Luanne Bras, MD;  Location: [place];  Service: Radiology;  Laterality: N/A;  . TONSILLECTOMY    . TUBAL LIGATION    . UMBILICAL HERNIA REPAIR N/A 01/25/2016   Procedure: LAPAROSCOPIC UMBILICAL HERNIA;  Surgeon: Ladora Daniel, MD;  Location: [facility] ORS;  Service: General;  Laterality: N/A;  . UMBILICAL HERNIA REPAIR N/A 10/20/2016   Procedure: HERNIA REPAIR UMBILICAL ADULT;  Surgeon: Leonie Green, MD;  Location: [facility] ORS;  Service: General;  Laterality: N/A;    There were no vitals filed for this visit.   Subjective Assessment - 12/08/20 1122    Subjective  Patient reports she has been using her putty during work.    Patient is accompanied by: Family member    Pertinent History 50 year old female with history of CVA on 07/15/2019 with left sided weakness. Pt previously completed inpatient rehab and outpatient therapy services. Patient has PMH significant for DM, GERD, HTN, Asthma, Herpes,  Costochondritis, Vit B12 deficiency.    Limitations LUE AROM, [place]    Patient Stated Goals Patient would like to be as independent as possible, return to her prior level of function    Currently in Pain? No/denies    Pain Onset Other (comment)   Chronic since 06/2019           Neuromuscular Re-education Pt worked on flipping cards while incorporating thumb and index finger fo rthe development of fine motor coordination skills. Worked on progressively increasing the speed while maintaining control. Pt worked on [facility] skills grasping small 1" pegs from table top and placing them in Hundred pegboard following a design pattern. Pt completed a simple 20 peg design, required WBing rest breaks and R hand assist to stabilize board. Pt removed pegs from board and returned them to bin placed to L chest heigh requiring horizontal abduction.    Therapeutic Exercise Pt tolerated 1 set x 10 reps each for standing AAROM using pvc pipe for active assistance including shoulder flexion, abduction, horizontal abduction, elbow flexion, extension, and chest press. Pt tolerated seated bodyweight exercises 1 set x10 reps each shoulder shrugs, scapular retraction/protraction, and shoulder abduction.                  OT Education - 12/08/20 1123    Education Details HEP, WBing rest breaks    Person(s) Educated Patient    Methods Explanation;Demonstration;Handout    Comprehension Verbalized understanding;Returned demonstration            OT Short Term Goals - 12/06/20 1525      OT SHORT TERM GOAL #1   Title Pt will independently complete HEP.    Baseline eval: pt completes mirror box therapy and [place] exercises    Time 6    Period Weeks    Status New    Target Date 01/17/21      OT SHORT TERM GOAL #2   Title Pt will cut meat with MOD I utilizing adaptive equipment as needed    Baseline Eval: uses good grips, requires MIN A    Time 6    Period Weeks    Status New    Target Date 01/17/21              OT Long Term Goals - 12/08/20 1149      OT LONG TERM GOAL #1   Title Pt will increase left shoulder flexion AROM by 20 degrees to complete hair washing/brushing with LUE.    Baseline Eval: seated shoulder flexion AROM 125*    Time 12    Period Weeks    Status New      OT LONG TERM GOAL #2   Title Pt will increase left grip by 15# to assist with picking up objects and opening cans.    Baseline  Eval: L grip 25#    Time 12    Period Weeks    Status New      OT LONG TERM GOAL #3   Title Pt will Independently don/doff bra in sitting    Baseline Eval: unable to hook/unhook    Time 12    Period Weeks    Status New      OT LONG TERM GOAL #4   Title Pt will improve L hand PEG time by 1 min to improve [place] and manipulate small objects during I/ADLs including folding laundry.    Baseline Eval: PEG L 3 min 59 sec    Time 12    Period Weeks    Status New      OT LONG TERM GOAL #5   Title Pt will improve FOTO score 49 or greater to show clinically relevant change to engage in ADL and IADL tasks with greater independence    Baseline Eval: FOTO 45    Time 12    Period Weeks    Status New    Target Date 02/28/21                 Plan - 12/08/20 1125    Clinical Impression Statement Pt completed simple 20 peg design on hundred peg board, required multimple WBing rest breaks. Pt continues to compensate proximally by hiking L shoulder during [facility] tasks. Pt demonstrated improved L shoulder AAROM using pvc pipe, cues for technique. Pt will benefit from skilled OT services to work on improving L strength and [place] in order to improve engagement of the LUE during basic ADL, and IADL tasks.    OT Occupational Profile and History Detailed Assessment- Review of Records and additional review of physical, cognitive, psychosocial history related to current functional performance    Occupational performance deficits (Please refer to evaluation for details): ADL's;IADL's    Rehab  Potential Good    Clinical Decision Making Several treatment options, min-mod task modification necessary    Comorbidities Affecting Occupational Performance: May have comorbidities impacting occupational performance    Modification or Assistance to Complete Evaluation  Min-Moderate modification of tasks or assist with assess necessary to complete eval    OT Frequency 2x / week    OT Duration 12 weeks    OT Treatment/Interventions Self-care/ADL training;Moist Heat;DME and/or AE instruction;Neuromuscular education;Therapeutic exercise;Therapeutic activities;Energy conservation;Patient/family education    OT Home Exercise Plan theraputty    Consulted and Agree with Plan of Care Patient           Patient will benefit from skilled therapeutic intervention in order to improve the following deficits and impairments:           Visit Diagnosis: Muscle weakness (generalized)  Other lack of coordination    Problem List Patient Active Problem List   Diagnosis Date Noted  . E. coli UTI   . Diabetes mellitus type 2 in obese ([place])   . Chronic pain of right knee   . 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 Hemiparesis affecting left side as late effect of stroke ([place])   . Acute ischemic right middle cerebral artery (MCA) stroke ([place]) 07/22/2019  . Carotid artery dissection  (HCC) s/p stent placement 07/21/2019  . Diabetes mellitus type II, uncontrolled ([place]) 07/21/2019  . Acute blood loss anemia 07/21/2019  . Leukocytosis 07/21/2019  . Stroke (cerebrum) ([place]) - R MCA infarct s/p tenecteplase and mechanical thrombectomy w/ d/t ICA dissection  07/15/2019  . Middle cerebral artery embolism,  right 07/15/2019  . Controlled type 2 diabetes mellitus without complication, without long-term current use of insulin ([place]) 05/28/2017  . Attention deficit hyperactivity disorder (ADHD), combined type 04/18/2017  . Morbid obesity ([place]) 01/25/2016  . Umbilical hernia 12/75/1700  . Hiatal hernia  12/08/2015  . History of Clostridium difficile colitis 06/19/2014  . HTN (hypertension) 12/23/2013  . PCOS (polycystic ovarian syndrome) 12/23/2013  . Bariatric surgery status 01/28/2013    Dessie Coma, M.S. OTR/L  12/08/20, 12:15 PM  ascom [phone]  [place] MAIN [facility] SERVICES [address] [place], [place], [address] Phone: [phone]   Fax:  [phone]  Name: Kathryn Coffey MRN: [id number] Date of Birth: [birth year]

## 2020-12-13 ENCOUNTER — Encounter: Payer: Self-pay | Admitting: Physical Therapy

## 2020-12-13 ENCOUNTER — Ambulatory Visit: Payer: No Typology Code available for payment source | Admitting: Physical Therapy

## 2020-12-13 ENCOUNTER — Other Ambulatory Visit: Payer: Self-pay

## 2020-12-13 DIAGNOSIS — M6281 Muscle weakness (generalized): Secondary | ICD-10-CM

## 2020-12-13 DIAGNOSIS — R262 Difficulty in walking, not elsewhere classified: Secondary | ICD-10-CM

## 2020-12-13 DIAGNOSIS — R2689 Other abnormalities of gait and mobility: Secondary | ICD-10-CM

## 2020-12-13 DIAGNOSIS — R278 Other lack of coordination: Secondary | ICD-10-CM

## 2020-12-13 NOTE — Therapy (Signed)
[place] MAIN [facility] SERVICES [address] [place], [place], [address] Phone: [phone]   Fax:  [phone]  Physical Therapy Treatment  Patient Details  Name: Kathryn Coffey MRN: [id number] Date of Birth: 05/31/1971 No data recorded  Encounter Date: 12/13/2020   PT End of Session - 12/13/20 1150    Visit Number 4    Number of Visits 25    Date for PT Re-Evaluation 02/14/21    Authorization Time Period Initial Cert= 08/26/9676- 9/38/1017    PT Start Time 1143    PT Stop Time 1225    PT Time Calculation (min) 42 min    Equipment Utilized During Treatment Gait belt    Activity Tolerance Patient tolerated treatment well;No increased pain    Behavior During Therapy WFL for tasks assessed/performed           Past Medical History:  Diagnosis Date  . Abdominal wall cellulitis 2013 and 2014  . Asthma    allergic to grasses  . B12 deficiency   . Complication of anesthesia   . Costochondritis   . Diabetes mellitus without complication ([place])   . Gastric anomaly    multiple small ulcers  . GERD (gastroesophageal reflux disease)   . Herpes 06/2018   POSSIBLY IN EYE- PT TAKING VALTREX AND WILL SEE OPTHAMOLOGIST TO SEE IF VALTREX IS WORKING  . History of abnormal cervical Pap smear   . History of Clostridium difficile colitis   . History of hiatal hernia   . History of kidney stones   . Hypertension   . IBS (irritable bowel syndrome)   . Migraine   . Morbid obesity ([place])    s/p attempted gastric banding now decompressed  . PCOS (polycystic ovarian syndrome)   . PONV (postoperative nausea and vomiting)    WITH SPINAL ONLY  . Stroke [facility])     Past Surgical History:  Procedure Laterality Date  . BREAST EXCISIONAL BIOPSY Left    cyst excision - Dr. Patty Sermons  . CESAREAN SECTION    . CHOLECYSTECTOMY    . COLONOSCOPY    . EAR CYST EXCISION Right 07/04/2018   Procedure: EXCISION GLOMUS TUMOR THUMBNAIL;  Surgeon: Corky Mull, MD;   Location: [facility] ORS;  Service: Orthopedics;  Laterality: Right;  . ESOPHAGOGASTRODUODENOSCOPY    . ESOPHAGOGASTRODUODENOSCOPY (EGD) WITH PROPOFOL N/A 12/06/2015   Procedure: ESOPHAGOGASTRODUODENOSCOPY (EGD) WITH PROPOFOL;  Surgeon: Manya Silvas, MD;  Location: [facility] ENDOSCOPY;  Service: Endoscopy;  Laterality: N/A;  . IR ANGIO INTRA EXTRACRAN SEL INTERNAL CAROTID UNI L MOD SED  07/15/2019  . IR ANGIO VERTEBRAL SEL SUBCLAVIAN INNOMINATE UNI R MOD SED  07/15/2019  . IR CT HEAD LTD  07/15/2019  . IR INTRAVSC STENT CERV CAROTID W/O EMB-PROT MOD SED INC ANGIO  07/15/2019  . IR PERCUTANEOUS ART THROMBECTOMY/INFUSION INTRACRANIAL INC DIAG ANGIO  07/15/2019  . LAPAROSCOPIC GASTRIC BANDING    . LAPAROSCOPIC GASTRIC RESTRICTIVE DUODENAL PROCEDURE (DUODENAL SWITCH) N/A 01/25/2016   Procedure: LAPAROSCOPIC GASTRIC RESTRICTIVE DUODENAL PROCEDURE (DUODENAL SWITCH);  Surgeon: Ladora Daniel, MD;  Location: [facility] ORS;  Service: General;  Laterality: N/A;  . OVARY SURGERY     x2  . RADIOLOGY WITH ANESTHESIA N/A 07/15/2019   Procedure: IR WITH ANESTHESIA;  Surgeon: Luanne Bras, MD;  Location: [place];  Service: Radiology;  Laterality: N/A;  . TONSILLECTOMY    . TUBAL LIGATION    . UMBILICAL HERNIA REPAIR N/A 01/25/2016   Procedure: LAPAROSCOPIC UMBILICAL HERNIA;  Surgeon:  Ladora Daniel, MD;  Location: [facility] ORS;  Service: General;  Laterality: N/A;  . UMBILICAL HERNIA REPAIR N/A 10/20/2016   Procedure: HERNIA REPAIR UMBILICAL ADULT;  Surgeon: Leonie Green, MD;  Location: [facility] ORS;  Service: General;  Laterality: N/A;    There were no vitals filed for this visit.   Subjective Assessment - 12/13/20 1148    Subjective Patient reports doing well; Denies any pain or soreness after last session; no new falls; no new complaints;    Patient is accompained by: Family member   husband, Ray   Pertinent History 50 year old female with history of CVA on 07/15/2019 with left sided weakness. Patient has PMH  significant for DM, GERD, HTN, Asthma, Herpes, Costochondritis, Vit B12 deficiency.    Limitations Lifting;Standing;Walking    How long can you sit comfortably? no restrictions    How long can you stand comfortably? Limited with prolonged standing - around 30 min    How long can you walk comfortably? around 30 min    Patient Stated Goals I want to improve my strength and increase my stability with standing/walking    Currently in Pain? No/denies    Pain Onset Other (comment)   Chronic since 06/2019   Multiple Pain Sites No                  Interventions:  Leg press: -BLE40# x15 reps with cues for proper positioning -LLE only 25# 2x10 with min A and tactile cues to slow down LE movement to isolate better quad control;   Patient reports increased toe flexor tone and foot drag on LLE. She presents to therapy wearing sandals and states that she prefers to wear sandals over regular shoes. She has had multiple AFOs but doesn't like wearing them due to cumbersome and uncomfortable;  Patient fit with walkaide for functional e-stim for ankle DF. Patient able to exhibit better foot DF with walkaide activation;  Will continue to work with patient with walkaide for better gait mechanics;  Patient instructed in foot clearance activities: -stepping over 1/2 bolster with LLE only x10 reps forward/backward with 1-0 rail assist; Required close supervision for safety with increased unsteadiness noted without rail assist;   Patient would benefit from additional skilled PT Intervention to improve foot clearance for better gait mechanics;                       PT Education - 12/13/20 1150    Education Details LE strengthening, HEP, balance;    Person(s) Educated Patient    Methods Explanation;Verbal cues    Comprehension Verbalized understanding;Returned demonstration;Verbal cues required;Need further instruction            PT Short Term Goals - 11/23/20 0844      PT SHORT  TERM GOAL #1   Title Pt will be independent with HEP in order to improve strength and balance in order to decrease fall risk and improve function at home and work.    Baseline 11/22/2020- Patient has no formal HEP in place.    Time 6    Period Weeks    Status New    Target Date 01/03/21             PT Long Term Goals - 11/23/20 0845      PT LONG TERM GOAL #1   Title Pt will improve DGI by at least 3 points in order to demonstrate clinically significant improvement in balance and decreased risk  for falls.    Baseline 11/22/2020= DGI score= 15/24    Time 12    Period Weeks    Status New    Target Date 02/14/21      PT LONG TERM GOAL #2   Title Pt will decrease 5TSTS by at least 3 seconds in order to demonstrate clinically significant improvement in LE strength.    Baseline 11/22/2020= 12 sec    Time 12    Period Weeks    Status New    Target Date 02/14/21      PT LONG TERM GOAL #3   Title Pt will decrease TUG to below 11 seconds/decrease in order to demonstrate decreased fall risk.    Baseline 11/22/2020= 13.34 sec without an AD    Time 12    Period Weeks    Status New    Target Date 02/14/21      PT LONG TERM GOAL #4   Title Patient will increase FOTO score to equal to or greater than 66  to demonstrate statistically significant improvement in mobility and quality of life.    Baseline 11/22/2020= FOTO= 62    Time 12    Period Weeks    Status New    Target Date 02/14/21      PT LONG TERM GOAL #5   Title Patient will report a worst pain of 3/10 on VAS (Left knee)  to improve tolerance with ADLs and reduced symptoms with activities.    Baseline 11/22/2020- Patient reports 4/10 left Lateral knee pain- worse with standing/walking    Time 12    Period Weeks    Status New    Target Date 02/14/21                 Plan - 12/13/20 1456    Clinical Impression Statement PT idenitifed increased LLE foot toe flexor tone and ankle PF during LE movement. Patient reports that her  foot drag is her biggest limitation. PT fit patient for walkaide to facilitate better ankle DF and toe extension during swing/initial contact. Patient has had multiple AFOs but doesn't like wearing them because she prefers to wear sandals for comfort. Patient was able to exhibit better ankle DF with walkaide activation. Patient would benefit from additional skilled PT intervention to improve strength, balance and mobility;    Personal Factors and Comorbidities Comorbidity 2    Comorbidities Diabetes, CVA    Examination-Activity Limitations Carry;Lift;Transfers    Examination-Participation Restrictions Occupation;Cleaning;Community Activity;Shop;Yard Work    Stability/Clinical Decision Making Stable/Uncomplicated    Rehab Potential Good    PT Frequency 2x / week    PT Duration 12 weeks    PT Treatment/Interventions ADLs/Self Care Home Management;Biofeedback;Electrical Stimulation;Moist Heat;DME Instruction;Gait training;Stair training;Functional mobility training;Therapeutic exercise;Balance training;Neuromuscular re-education;Therapeutic activities;Patient/family education;Manual techniques;Passive range of motion;Dry needling    PT Next Visit Plan Initiate LE Strengthening and balance activities    PT Home Exercise Plan To be initiated next 1-2 visits.    Consulted and Agree with Plan of Care Patient           Patient will benefit from skilled therapeutic intervention in order to improve the following deficits and impairments:  Abnormal gait,Decreased activity tolerance,Decreased coordination,Decreased endurance,Decreased mobility,Decreased strength,Impaired tone,Impaired UE functional use,Pain  Visit Diagnosis: Muscle weakness (generalized)  Other lack of coordination  Difficulty in walking, not elsewhere classified  Other abnormalities of gait and mobility     Problem List Patient Active Problem List   Diagnosis Date Noted  . E. coli  UTI   . Diabetes mellitus type 2 in obese  ([place])   . Chronic pain of right knee   . 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 Hemiparesis affecting left side as late effect of stroke ([place])   . Acute ischemic right middle cerebral artery (MCA) stroke ([place]) 07/22/2019  . Carotid artery dissection  (HCC) s/p stent placement 07/21/2019  . Diabetes mellitus type II, uncontrolled ([place]) 07/21/2019  . Acute blood loss anemia 07/21/2019  . Leukocytosis 07/21/2019  . Stroke (cerebrum) ([place]) - R MCA infarct s/p tenecteplase and mechanical thrombectomy w/ d/t ICA dissection  07/15/2019  . Middle cerebral artery embolism, right 07/15/2019  . Controlled type 2 diabetes mellitus without complication, without long-term current use of insulin ([place]) 05/28/2017  . Attention deficit hyperactivity disorder (ADHD), combined type 04/18/2017  . Morbid obesity ([place]) 01/25/2016  . Umbilical hernia 69/67/8938  . Hiatal hernia 12/08/2015  . History of Clostridium difficile colitis 06/19/2014  . HTN (hypertension) 12/23/2013  . PCOS (polycystic ovarian syndrome) 12/23/2013  . Bariatric surgery status 01/28/2013    Rosalynn Sergent PT, DPT 12/13/2020, 2:59 PM  [place] MAIN [facility] SERVICES [address] [place], [place], [address] Phone: [phone]   Fax:  [phone]  Name: PERMELIA BAMBA MRN: [id number] Date of Birth: August 08, 1971

## 2020-12-14 ENCOUNTER — Other Ambulatory Visit: Payer: Self-pay

## 2020-12-14 MED FILL — Dulaglutide Soln Auto-injector 1.5 MG/0.5ML: SUBCUTANEOUS | 28 days supply | Qty: 2 | Fill #0 | Status: AC

## 2020-12-15 ENCOUNTER — Other Ambulatory Visit: Payer: Self-pay

## 2020-12-15 ENCOUNTER — Encounter: Payer: Self-pay | Admitting: Physical Therapy

## 2020-12-15 ENCOUNTER — Ambulatory Visit: Payer: No Typology Code available for payment source | Admitting: Physical Therapy

## 2020-12-15 DIAGNOSIS — R278 Other lack of coordination: Secondary | ICD-10-CM

## 2020-12-15 DIAGNOSIS — R2689 Other abnormalities of gait and mobility: Secondary | ICD-10-CM

## 2020-12-15 DIAGNOSIS — M6281 Muscle weakness (generalized): Secondary | ICD-10-CM

## 2020-12-15 DIAGNOSIS — R262 Difficulty in walking, not elsewhere classified: Secondary | ICD-10-CM

## 2020-12-15 MED FILL — Escitalopram Oxalate Tab 10 MG (Base Equiv): ORAL | 30 days supply | Qty: 30 | Fill #0 | Status: AC

## 2020-12-15 NOTE — Therapy (Signed)
[place] MAIN [facility] SERVICES [address] [place], [place], [address] Phone: [phone]   Fax:  [phone]  Physical Therapy Treatment  Patient Details  Name: Kathryn Coffey MRN: [id number] Date of Birth: 1970/10/01 No data recorded  Encounter Date: 12/15/2020   PT End of Session - 12/15/20 1213    Visit Number 5    Number of Visits 25    Date for PT Re-Evaluation 02/14/21    Authorization Time Period Initial Cert= 0/01/2693- 8/54/6270    PT Start Time 1146    PT Stop Time 1230    PT Time Calculation (min) 44 min    Equipment Utilized During Treatment Gait belt    Activity Tolerance Patient tolerated treatment well;No increased pain    Behavior During Therapy WFL for tasks assessed/performed           Past Medical History:  Diagnosis Date  . Abdominal wall cellulitis 2013 and 2014  . Asthma    allergic to grasses  . B12 deficiency   . Complication of anesthesia   . Costochondritis   . Diabetes mellitus without complication ([place])   . Gastric anomaly    multiple small ulcers  . GERD (gastroesophageal reflux disease)   . Herpes 06/2018   POSSIBLY IN EYE- PT TAKING VALTREX AND WILL SEE OPTHAMOLOGIST TO SEE IF VALTREX IS WORKING  . History of abnormal cervical Pap smear   . History of Clostridium difficile colitis   . History of hiatal hernia   . History of kidney stones   . Hypertension   . IBS (irritable bowel syndrome)   . Migraine   . Morbid obesity ([place])    s/p attempted gastric banding now decompressed  . PCOS (polycystic ovarian syndrome)   . PONV (postoperative nausea and vomiting)    WITH SPINAL ONLY  . Stroke [facility])     Past Surgical History:  Procedure Laterality Date  . BREAST EXCISIONAL BIOPSY Left    cyst excision - Dr. Patty Sermons  . CESAREAN SECTION    . CHOLECYSTECTOMY    . COLONOSCOPY    . EAR CYST EXCISION Right 07/04/2018   Procedure: EXCISION GLOMUS TUMOR THUMBNAIL;  Surgeon: Corky Mull, MD;   Location: [facility] ORS;  Service: Orthopedics;  Laterality: Right;  . ESOPHAGOGASTRODUODENOSCOPY    . ESOPHAGOGASTRODUODENOSCOPY (EGD) WITH PROPOFOL N/A 12/06/2015   Procedure: ESOPHAGOGASTRODUODENOSCOPY (EGD) WITH PROPOFOL;  Surgeon: Manya Silvas, MD;  Location: [facility] ENDOSCOPY;  Service: Endoscopy;  Laterality: N/A;  . IR ANGIO INTRA EXTRACRAN SEL INTERNAL CAROTID UNI L MOD SED  07/15/2019  . IR ANGIO VERTEBRAL SEL SUBCLAVIAN INNOMINATE UNI R MOD SED  07/15/2019  . IR CT HEAD LTD  07/15/2019  . IR INTRAVSC STENT CERV CAROTID W/O EMB-PROT MOD SED INC ANGIO  07/15/2019  . IR PERCUTANEOUS ART THROMBECTOMY/INFUSION INTRACRANIAL INC DIAG ANGIO  07/15/2019  . LAPAROSCOPIC GASTRIC BANDING    . LAPAROSCOPIC GASTRIC RESTRICTIVE DUODENAL PROCEDURE (DUODENAL SWITCH) N/A 01/25/2016   Procedure: LAPAROSCOPIC GASTRIC RESTRICTIVE DUODENAL PROCEDURE (DUODENAL SWITCH);  Surgeon: Ladora Daniel, MD;  Location: [facility] ORS;  Service: General;  Laterality: N/A;  . OVARY SURGERY     x2  . RADIOLOGY WITH ANESTHESIA N/A 07/15/2019   Procedure: IR WITH ANESTHESIA;  Surgeon: Luanne Bras, MD;  Location: [place];  Service: Radiology;  Laterality: N/A;  . TONSILLECTOMY    . TUBAL LIGATION    . UMBILICAL HERNIA REPAIR N/A 01/25/2016   Procedure: LAPAROSCOPIC UMBILICAL HERNIA;  Surgeon:  Ladora Daniel, MD;  Location: [facility] ORS;  Service: General;  Laterality: N/A;  . UMBILICAL HERNIA REPAIR N/A 10/20/2016   Procedure: HERNIA REPAIR UMBILICAL ADULT;  Surgeon: Leonie Green, MD;  Location: [facility] ORS;  Service: General;  Laterality: N/A;    There were no vitals filed for this visit.   Subjective Assessment - 12/15/20 1213    Subjective Patient reports doing well; Denies any pain or soreness after last session; no new falls; no new complaints;    Patient is accompained by: Family member   husband, Ray   Pertinent History 50 year old female with history of CVA on 07/15/2019 with left sided weakness. Patient has PMH  significant for DM, GERD, HTN, Asthma, Herpes, Costochondritis, Vit B12 deficiency.    Limitations Lifting;Standing;Walking    How long can you sit comfortably? no restrictions    How long can you stand comfortably? Limited with prolonged standing - around 30 min    How long can you walk comfortably? around 30 min    Patient Stated Goals I want to improve my strength and increase my stability with standing/walking    Currently in Pain? No/denies    Pain Onset Other (comment)   Chronic since 06/2019         TREATMENT: Initial 10 meter walk 11.25 sec without AD at comfortable pace    Fit patient for walkaide on LLE to facilitate better foot/ankle DF during swing phase.  Patient able to exhibit good response with walkaide with improved foot DF during swing and less toe flexion for better gait mechanics Settings: 250 pulse width; Intensity #6.5  Instructed patient in walkaide exercise mode for LE strengthening and to facilitate better tolerance with device, on 3 sec, off 5 sec x5 min;    Patient ambulated around gym with walkaide x   320       feet. She reports feeling slight discomfort with walkaide but states, "Its not that bad." Skin inspected with good integrity noted after walkaide use.  When walking on level surface with walkaide, patient exhibits less toe flexion and better foot clearance;   Patient instructed in negotiating obstacles:  Walking, stepping over  bolster, stepping up and over 4 inch step  x2 reps with increased toe flexion noted despite walkaide use;  Walking around cones #5 weaving x2 sets with increased LLE toe flexion and slight decrease in foot clearance;     Patient tolerated session well. Skin inspected upon walkaide removal with good skin integrity noted;                      PT Education - 12/15/20 1213    Education Details walkaide, gait safety    Person(s) Educated Patient    Methods Explanation    Comprehension Verbalized  understanding            PT Short Term Goals - 11/23/20 0844      PT SHORT TERM GOAL #1   Title Pt will be independent with HEP in order to improve strength and balance in order to decrease fall risk and improve function at home and work.    Baseline 11/22/2020- Patient has no formal HEP in place.    Time 6    Period Weeks    Status New    Target Date 01/03/21             PT Long Term Goals - 11/23/20 0845      PT LONG TERM GOAL #  1   Title Pt will improve DGI by at least 3 points in order to demonstrate clinically significant improvement in balance and decreased risk for falls.    Baseline 11/22/2020= DGI score= 15/24    Time 12    Period Weeks    Status New    Target Date 02/14/21      PT LONG TERM GOAL #2   Title Pt will decrease 5TSTS by at least 3 seconds in order to demonstrate clinically significant improvement in LE strength.    Baseline 11/22/2020= 12 sec    Time 12    Period Weeks    Status New    Target Date 02/14/21      PT LONG TERM GOAL #3   Title Pt will decrease TUG to below 11 seconds/decrease in order to demonstrate decreased fall risk.    Baseline 11/22/2020= 13.34 sec without an AD    Time 12    Period Weeks    Status New    Target Date 02/14/21      PT LONG TERM GOAL #4   Title Patient will increase FOTO score to equal to or greater than 66  to demonstrate statistically significant improvement in mobility and quality of life.    Baseline 11/22/2020= FOTO= 62    Time 12    Period Weeks    Status New    Target Date 02/14/21      PT LONG TERM GOAL #5   Title Patient will report a worst pain of 3/10 on VAS (Left knee)  to improve tolerance with ADLs and reduced symptoms with activities.    Baseline 11/22/2020- Patient reports 4/10 left Lateral knee pain- worse with standing/walking    Time 12    Period Weeks    Status New    Target Date 02/14/21                 Plan - 12/15/20 1238    Clinical Impression Statement Patient tolerated session  well. She was excited about using walkaide for better foot clearance during gait. She has worn multiple AFOs but prefers not to wear them as she can't wear her sandals which she prefers to wear during warmer months. Pt excited that the walkaide gives her the flexibility of various footwear while still assisting her in foot clearance during gait. She was able to exhibit good foot clearance and toe extension when walking on level surface. However when patient negotiated obstacles, increased toe flexion and decreased foot clearance noted;  Patient would benefit from additional skilled PT intervention to improve strength, balance and gait safety;    Personal Factors and Comorbidities Comorbidity 2    Comorbidities Diabetes, CVA    Examination-Activity Limitations Carry;Lift;Transfers    Examination-Participation Restrictions Occupation;Cleaning;Community Activity;Shop;Yard Work    Stability/Clinical Decision Making Stable/Uncomplicated    Rehab Potential Good    PT Frequency 2x / week    PT Duration 12 weeks    PT Treatment/Interventions ADLs/Self Care Home Management;Biofeedback;Electrical Stimulation;Moist Heat;DME Instruction;Gait training;Stair training;Functional mobility training;Therapeutic exercise;Balance training;Neuromuscular re-education;Therapeutic activities;Patient/family education;Manual techniques;Passive range of motion;Dry needling    PT Next Visit Plan Initiate LE Strengthening and balance activities    PT Home Exercise Plan To be initiated next 1-2 visits.    Consulted and Agree with Plan of Care Patient           Patient will benefit from skilled therapeutic intervention in order to improve the following deficits and impairments:  Abnormal gait,Decreased activity tolerance,Decreased coordination,Decreased endurance,Decreased mobility,Decreased  strength,Impaired tone,Impaired UE functional use,Pain  Visit Diagnosis: Muscle weakness (generalized)  Other lack of  coordination  Difficulty in walking, not elsewhere classified  Other abnormalities of gait and mobility     Problem List Patient Active Problem List   Diagnosis Date Noted  . E. coli UTI   . Diabetes mellitus type 2 in obese ([place])   . Chronic pain of right knee   . 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 Hemiparesis affecting left side as late effect of stroke ([place])   . Acute ischemic right middle cerebral artery (MCA) stroke ([place]) 07/22/2019  . Carotid artery dissection  (HCC) s/p stent placement 07/21/2019  . Diabetes mellitus type II, uncontrolled ([place]) 07/21/2019  . Acute blood loss anemia 07/21/2019  . Leukocytosis 07/21/2019  . Stroke (cerebrum) ([place]) - R MCA infarct s/p tenecteplase and mechanical thrombectomy w/ d/t ICA dissection  07/15/2019  . Middle cerebral artery embolism, right 07/15/2019  . Controlled type 2 diabetes mellitus without complication, without long-term current use of insulin ([place]) 05/28/2017  . Attention deficit hyperactivity disorder (ADHD), combined type 04/18/2017  . Morbid obesity ([place]) 01/25/2016  . Umbilical hernia [id number]  . Hiatal hernia 12/08/2015  . History of Clostridium difficile colitis 06/19/2014  . HTN (hypertension) 12/23/2013  . PCOS (polycystic ovarian syndrome) 12/23/2013  . Bariatric surgery status 01/28/2013    Kathryn Coffey PT, DPT 12/15/2020, 12:39 PM  [place] MAIN [facility] SERVICES [address] [place], [place], [address] Phone: [phone]   Fax:  [phone]  Name: Kathryn Coffey MRN: [id number] Date of Birth: 1971/04/18

## 2020-12-16 ENCOUNTER — Ambulatory Visit: Payer: No Typology Code available for payment source | Admitting: Psychiatry

## 2020-12-20 ENCOUNTER — Ambulatory Visit: Payer: No Typology Code available for payment source | Admitting: Physical Therapy

## 2020-12-22 ENCOUNTER — Ambulatory Visit: Payer: No Typology Code available for payment source | Admitting: Occupational Therapy

## 2020-12-22 ENCOUNTER — Ambulatory Visit
Payer: No Typology Code available for payment source | Attending: Physical Medicine and Rehabilitation | Admitting: Physical Therapy

## 2020-12-22 ENCOUNTER — Encounter: Payer: Self-pay | Admitting: Physical Therapy

## 2020-12-22 ENCOUNTER — Other Ambulatory Visit: Payer: Self-pay

## 2020-12-22 ENCOUNTER — Encounter: Payer: Self-pay | Admitting: Occupational Therapy

## 2020-12-22 DIAGNOSIS — R293 Abnormal posture: Secondary | ICD-10-CM | POA: Diagnosis present

## 2020-12-22 DIAGNOSIS — R2689 Other abnormalities of gait and mobility: Secondary | ICD-10-CM | POA: Insufficient documentation

## 2020-12-22 DIAGNOSIS — R278 Other lack of coordination: Secondary | ICD-10-CM | POA: Diagnosis present

## 2020-12-22 DIAGNOSIS — M6281 Muscle weakness (generalized): Secondary | ICD-10-CM | POA: Insufficient documentation

## 2020-12-22 DIAGNOSIS — M542 Cervicalgia: Secondary | ICD-10-CM | POA: Diagnosis present

## 2020-12-22 DIAGNOSIS — R262 Difficulty in walking, not elsewhere classified: Secondary | ICD-10-CM | POA: Diagnosis present

## 2020-12-22 NOTE — Therapy (Signed)
[place] MAIN [facility] SERVICES [address] [place], [place], [address] Phone: [phone]   Fax:  [phone]  Occupational Therapy Treatment  Patient Details  Name: Kathryn Coffey MRN: [id number] Date of Birth: 10-12-1970 No data recorded  Encounter Date: 12/22/2020   OT End of Session - 12/22/20 1947    Visit Number 3    Number of Visits 25    Date for OT Re-Evaluation 02/28/21    Authorization Type Progress report period starting  on 12/06/20    OT Start Time 1100    OT Stop Time 1145    OT Time Calculation (min) 45 min    Activity Tolerance Patient tolerated treatment well    Behavior During Therapy [facility] for tasks assessed/performed           Past Medical History:  Diagnosis Date  . Abdominal wall cellulitis 2013 and 2014  . Asthma    allergic to grasses  . B12 deficiency   . Complication of anesthesia   . Costochondritis   . Diabetes mellitus without complication ([place])   . Gastric anomaly    multiple small ulcers  . GERD (gastroesophageal reflux disease)   . Herpes 06/2018   POSSIBLY IN EYE- PT TAKING VALTREX AND WILL SEE OPTHAMOLOGIST TO SEE IF VALTREX IS WORKING  . History of abnormal cervical Pap smear   . History of Clostridium difficile colitis   . History of hiatal hernia   . History of kidney stones   . Hypertension   . IBS (irritable bowel syndrome)   . Migraine   . Morbid obesity ([place])    s/p attempted gastric banding now decompressed  . PCOS (polycystic ovarian syndrome)   . PONV (postoperative nausea and vomiting)    WITH SPINAL ONLY  . Stroke [facility])     Past Surgical History:  Procedure Laterality Date  . BREAST EXCISIONAL BIOPSY Left    cyst excision - Dr. Patty Sermons  . CESAREAN SECTION    . CHOLECYSTECTOMY    . COLONOSCOPY    . EAR CYST EXCISION Right 07/04/2018   Procedure: EXCISION GLOMUS TUMOR THUMBNAIL;  Surgeon: Corky Mull, MD;  Location: [facility] ORS;  Service: Orthopedics;  Laterality:  Right;  . ESOPHAGOGASTRODUODENOSCOPY    . ESOPHAGOGASTRODUODENOSCOPY (EGD) WITH PROPOFOL N/A 12/06/2015   Procedure: ESOPHAGOGASTRODUODENOSCOPY (EGD) WITH PROPOFOL;  Surgeon: Manya Silvas, MD;  Location: [facility] END[facility]COPY;  Service: Endoscopy;  Laterality: N/A;  . IR ANGIO INTRA EXTRACRAN SEL INTERNAL CAROTID UNI L MOD SED  07/15/2019  . IR ANGIO VERTEBRAL SEL SUBCLAVIAN INNOMINATE UNI R MOD SED  07/15/2019  . IR CT HEAD LTD  07/15/2019  . IR INTRAVSC STENT CERV CAROTID W/O EMB-PROT MOD SED INC ANGIO  07/15/2019  . IR PERCUTANEOUS ART THROMBECTOMY/INFUSION INTRACRANIAL INC DIAG ANGIO  07/15/2019  . LAPAROSCOPIC GASTRIC BANDING    . LAPAROSCOPIC GASTRIC RESTRICTIVE DUODENAL PROCEDURE (DUODENAL SWITCH) N/A 01/25/2016   Procedure: LAPAROSCOPIC GASTRIC RESTRICTIVE DUODENAL PROCEDURE (DUODENAL SWITCH);  Surgeon: Ladora Daniel, MD;  Location: [facility] ORS;  Service: General;  Laterality: N/A;  . OVARY SURGERY     x2  . RADIOLOGY WITH ANESTHESIA N/A 07/15/2019   Procedure: IR WITH ANESTHESIA;  Surgeon: Luanne Bras, MD;  Location: [facility];  Service: Radiology;  Laterality: N/A;  . TONSILLECTOMY    . TUBAL LIGATION    . UMBILICAL HERNIA REPAIR N/A 01/25/2016   Procedure: LAPAROSCOPIC UMBILICAL HERNIA;  Surgeon: Ladora Daniel, MD;  Location: [facility] ORS;  Service: General;  Laterality: N/A;  . UMBILICAL HERNIA REPAIR N/A 10/20/2016   Procedure: HERNIA REPAIR UMBILICAL ADULT;  Surgeon: Leonie Green, MD;  Location: [facility] ORS;  Service: General;  Laterality: N/A;    There were no vitals filed for this visit.   Subjective Assessment - 12/22/20 1946    Subjective  Pt reports she has several appointments throughout the week and asking if its ok to come to therapy 1 time a week.    Pertinent History 50 year old female with history of CVA on 07/15/2019 with left sided weakness. Pt previously completed inpatient rehab and outpatient therapy services. Patient has PMH significant for DM, GERD, HTN, Asthma,  Herpes, Costochondritis, Vit B12 deficiency.    Patient Stated Goals Patient would like to be as independent as possible, return to her prior level of function    Currently in Pain? No/denies    Pain Score 0-No pain             Therapeutic Exercise:  Pt denies any LUE pain at rest but has some pain with return from flexion with ROM activities, rated 4/10 Patient seen for PROM of LUE followed by [facility] for shoulder flexion, ABD, diagonal patterns with therapist guiding, supination.  Scapular retraction, shoulder rolls for 10 reps each ex Weighted dowel, 1# for shoulder flexion, ABD, chest press, elbow flexion/extension.  Mild pain and "catching" at times in left UE.  Tabletop stretches with use of towel, LUE forward reaching and diagonal patterns with cues for direction. Finger ROM at table for ABD, tapping together and in isolation.  Discussed use of putty at home and the importance of also making sure to perform finger extension.  If forearm and hand has increased pain then reduce the number of repetitions or frequency of putty use.    Response to tx: Pt tends to compensate at left shoulder in elevation with attempts to use left hand to perform tasks, responds well to cues.  Pt does have some pain in LUE with return from shoulder flexion with movements which require overhead reaching.  Continues to have difficulty with tasks such as using left hand for typing, reaching to wash and style hair and use with ADL and IADL tasks at home and work.  Continue to work towards goals in plan of care to maximize safety and independence in necessary daily tasks.                     OT Education - 12/22/20 1947    Education Details HEP, WBing rest breaks    Person(s) Educated Patient    Methods Explanation;Demonstration;Handout    Comprehension Verbalized understanding;Returned demonstration            OT Short Term Goals - 12/06/20 1525      OT SHORT TERM GOAL #1   Title Pt  will independently complete HEP.    Baseline eval: pt completes mirror box therapy and [place] exercises    Time 6    Period Weeks    Status New    Target Date 01/17/21      OT SHORT TERM GOAL #2   Title Pt will cut meat with MOD I utilizing adaptive equipment as needed    Baseline Eval: uses good grips, requires MIN A    Time 6    Period Weeks    Status New    Target Date 01/17/21             OT Long  Term Goals - 12/08/20 1149      OT LONG TERM GOAL #1   Title Pt will increase left shoulder flexion AROM by 20 degrees to complete hair washing/brushing with LUE.    Baseline Eval: seated shoulder flexion AROM 125*    Time 12    Period Weeks    Status New      OT LONG TERM GOAL #2   Title Pt will increase left grip by 15# to assist with picking up objects and opening cans.    Baseline Eval: L grip 25#    Time 12    Period Weeks    Status New      OT LONG TERM GOAL #3   Title Pt will Independently don/doff bra in sitting    Baseline Eval: unable to hook/unhook    Time 12    Period Weeks    Status New      OT LONG TERM GOAL #4   Title Pt will improve L hand PEG time by 1 min to improve [place] and manipulate small objects during I/ADLs including folding laundry.    Baseline Eval: PEG L 3 min 59 sec    Time 12    Period Weeks    Status New      OT LONG TERM GOAL #5   Title Pt will improve FOTO score 49 or greater to show clinically relevant change to engage in ADL and IADL tasks with greater independence    Baseline Eval: FOTO 45    Time 12    Period Weeks    Status New    Target Date 02/28/21                 Plan - 12/22/20 1948    Clinical Impression Statement Pt tends to compensate at left shoulder in elevation with attempts to use left hand to perform tasks, responds well to cues.  Pt does have some pain in LUE with return from shoulder flexion with movements which require overhead reaching.  Continues to have difficulty with tasks such as using left hand for  typing, reaching to wash and style hair and use with ADL and IADL tasks at home and work.  Continue to work towards goals in plan of care to maximize safety and independence in necessary daily tasks.    OT Occupational Profile and History Detailed Assessment- Review of Records and additional review of physical, cognitive, psychosocial history related to current functional performance    Occupational performance deficits (Please refer to evaluation for details): ADL's;IADL's    Rehab Potential Good    Clinical Decision Making Several treatment options, min-mod task modification necessary    Comorbidities Affecting Occupational Performance: May have comorbidities impacting occupational performance    Modification or Assistance to Complete Evaluation  Min-Moderate modification of tasks or assist with assess necessary to complete eval    OT Frequency 2x / week    OT Duration 12 weeks    OT Treatment/Interventions Self-care/ADL training;Moist Heat;DME and/or AE instruction;Neuromuscular education;Therapeutic exercise;Therapeutic activities;Energy conservation;Patient/family education    OT Home Exercise Plan theraputty    Consulted and Agree with Plan of Care Patient           Patient will benefit from skilled therapeutic intervention in order to improve the following deficits and impairments:           Visit Diagnosis: Muscle weakness (generalized)  Other lack of coordination    Problem List Patient Active Problem List   Diagnosis Date Noted  .  E. coli UTI   . Diabetes mellitus type 2 in obese ([place])   . Chronic pain of right knee   . 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 Hemiparesis affecting left side as late effect of stroke ([place])   . Acute ischemic right middle cerebral artery (MCA) stroke ([place]) 07/22/2019  . Carotid artery dissection  (HCC) s/p stent placement 07/21/2019  . Diabetes mellitus type II, uncontrolled ([place]) 07/21/2019  . Acute blood loss anemia  07/21/2019  . Leukocytosis 07/21/2019  . Stroke (cerebrum) ([place]) - R MCA infarct s/p tenecteplase and mechanical thrombectomy w/ d/t ICA dissection  07/15/2019  . Middle cerebral artery embolism, right 07/15/2019  . Controlled type 2 diabetes mellitus without complication, without long-term current use of insulin ([place]) 05/28/2017  . Attention deficit hyperactivity disorder (ADHD), combined type 04/18/2017  . Morbid obesity ([place]) 01/25/2016  . Umbilical hernia [id number]  . Hiatal hernia 12/08/2015  . History of Clostridium difficile colitis 06/19/2014  . HTN (hypertension) 12/23/2013  . PCOS (polycystic ovarian syndrome) 12/23/2013  . Bariatric surgery status 01/28/2013   Achilles Dunk, OTR/L, CLT Elin Fenley 12/23/2020, 2:06 PM  [place] MAIN [facility] SERVICES [address] [place], [place], [address] Phone: [phone]   Fax:  [phone]  Name: Kathryn Coffey MRN: [id number] Date of Birth: [birth year]

## 2020-12-22 NOTE — Therapy (Signed)
[place] MAIN [facility] SERVICES [address] [place], [place], [address] Phone: [phone]   Fax:  [phone]  Physical Therapy Treatment  Patient Details  Name: Kathryn Coffey MRN: [id number] Date of Birth: [birth year] No data recorded  Encounter Date: 12/22/2020   PT End of Session - 12/22/20 1159    Visit Number 6    Number of Visits 25    Date for PT Re-Evaluation 02/14/21    Authorization Time Period Initial Cert= 6/0/7371- 0/62/6948    PT Start Time 1150    PT Stop Time 1230    PT Time Calculation (min) 40 min    Equipment Utilized During Treatment Gait belt    Activity Tolerance Patient tolerated treatment well;No increased pain    Behavior During Therapy WFL for tasks assessed/performed           Past Medical History:  Diagnosis Date  . Abdominal wall cellulitis 2013 and 2014  . Asthma    allergic to grasses  . B12 deficiency   . Complication of anesthesia   . Costochondritis   . Diabetes mellitus without complication ([place])   . Gastric anomaly    multiple small ulcers  . GERD (gastroesophageal reflux disease)   . Herpes 06/2018   POSSIBLY IN EYE- PT TAKING VALTREX AND WILL SEE OPTHAMOLOGIST TO SEE IF VALTREX IS WORKING  . History of abnormal cervical Pap smear   . History of Clostridium difficile colitis   . History of hiatal hernia   . History of kidney stones   . Hypertension   . IBS (irritable bowel syndrome)   . Migraine   . Morbid obesity ([place])    s/p attempted gastric banding now decompressed  . PCOS (polycystic ovarian syndrome)   . PONV (postoperative nausea and vomiting)    WITH SPINAL ONLY  . Stroke [facility]     Past Surgical History:  Procedure Laterality Date  . BREAST EXCISIONAL BIOPSY Left    cyst excision - Dr. Patty Coffey  . CESAREAN SECTION    . CHOLECYSTECTOMY    . COLONOSCOPY    . EAR CYST EXCISION Right 07/04/2018   Procedure: EXCISION GLOMUS TUMOR THUMBNAIL;  Surgeon: Kathryn Mull, MD;   Location: [facility] ORS;  Service: Orthopedics;  Laterality: Right;  . ESOPHAGOGASTRODUODENOSCOPY    . ESOPHAGOGASTRODUODENOSCOPY (EGD) WITH PROPOFOL N/A 12/06/2015   Procedure: ESOPHAGOGASTRODUODENOSCOPY (EGD) WITH PROPOFOL;  Surgeon: Kathryn Silvas, MD;  Location: [facility] ENDOSCOPY;  Service: Endoscopy;  Laterality: N/A;  . IR ANGIO INTRA EXTRACRAN SEL INTERNAL CAROTID UNI L MOD SED  07/15/2019  . IR ANGIO VERTEBRAL SEL SUBCLAVIAN INNOMINATE UNI R MOD SED  07/15/2019  . IR CT HEAD LTD  07/15/2019  . IR INTRAVSC STENT CERV CAROTID W/O EMB-PROT MOD SED INC ANGIO  07/15/2019  . IR PERCUTANEOUS ART THROMBECTOMY/INFUSION INTRACRANIAL INC DIAG ANGIO  07/15/2019  . LAPAROSCOPIC GASTRIC BANDING    . LAPAROSCOPIC GASTRIC RESTRICTIVE DUODENAL PROCEDURE (DUODENAL SWITCH) N/A 01/25/2016   Procedure: LAPAROSCOPIC GASTRIC RESTRICTIVE DUODENAL PROCEDURE (DUODENAL SWITCH);  Surgeon: Kathryn Daniel, MD;  Location: [facility] ORS;  Service: General;  Laterality: N/A;  . OVARY SURGERY     x2  . RADIOLOGY WITH ANESTHESIA N/A 07/15/2019   Procedure: IR WITH ANESTHESIA;  Surgeon: Kathryn Bras, MD;  [place];  Service: Radiology;  Laterality: N/A;  . TONSILLECTOMY    . TUBAL LIGATION    . UMBILICAL HERNIA REPAIR N/A 01/25/2016   Procedure: LAPAROSCOPIC UMBILICAL HERNIA;  Surgeon:  Kathryn Daniel, MD;  Location: [facility] ORS;  Service: General;  Laterality: N/A;  . UMBILICAL HERNIA REPAIR N/A 10/20/2016   Procedure: HERNIA REPAIR UMBILICAL ADULT;  Surgeon: Kathryn Green, MD;  Location: [facility] ORS;  Service: General;  Laterality: N/A;    There were no vitals filed for this visit.   Subjective Assessment - 12/22/20 1157    Subjective Patient reports doing well; Pt reports her foot has done better with toes staying more extended; no new falls;    Patient is accompained by: Family member   husband, Kathryn Coffey   Pertinent History 50 year old female with history of CVA on 07/15/2019 with left sided weakness. Patient has PMH  significant for DM, GERD, HTN, Asthma, Herpes, Costochondritis, Vit B12 deficiency.    Limitations Lifting;Standing;Walking    How long can you sit comfortably? no restrictions    How long can you stand comfortably? Limited with prolonged standing - around 30 min    How long can you walk comfortably? around 30 min    Patient Stated Goals I want to improve my strength and increase my stability with standing/walking    Currently in Pain? No/denies    Pain Onset Other (comment)   Chronic since 06/2019                TREATMENT:  Fit patient for walkaide on LLE to facilitate better foot/ankle DF during swing phase.  Patient able to exhibit good response with walkaide with improved foot DF during swing and less toe flexion for better gait mechanics Settings: 250 pulse width; Intensity #8  Instructed patient in walkaide exercise mode for LE strengthening and to facilitate better tolerance with device, on 3 sec, off 5 sec x5 min;    Patient ambulated around gym with walkaide x   320       feet. She reports feeling no discomfort with walkaide. When walking on level surface with walkaide, patient exhibits less toe flexion and better foot clearance;   Patient instructed in negotiating obstacles:  Walking, stepping over  bolster, hurdle x4 reps  Walking around cones #5 weaving x4 sets with increased LLE toe flexion and slight decrease in foot clearance;   Stepping over orange hurdle with 1-0 rail assist x10 reps each LE with cues for weight shift and increased DF at heel strike  Gait around gym with walkaide on x300 feet with good toe extension on LLE noted; Patient does report feeling like timing of walkaide on/off times are off. PT re-calibrated machine with patient on treadmill 1.0 mph   Patient able to exhibit good toe extension/ankle DF with walkaide activation;  Gait around gym x300 feet x2 sets with good foot clearance and more normal gait pattern noted;    Patient  tolerated session well. Skin inspected upon walkaide removal with good skin integrity noted;  Patient reports feeling better with walking with significantly reduced effort in toe extension and ankle DF with gait tasks. She reports feeling more natural with walking and feeling more confident.   10 meter walk speed with walkaide: 0.84 m/s at comfortable pace;                     PT Education - 12/22/20 1159    Education Details walkaide/gait safety    Person(s) Educated Patient    Methods Explanation;Verbal cues    Comprehension Verbalized understanding;Returned demonstration;Verbal cues required;Need further instruction            PT Short Term  Goals - 11/23/20 0844      PT SHORT TERM GOAL #1   Title Pt will be independent with HEP in order to improve strength and balance in order to decrease fall risk and improve function at home and work.    Baseline 11/22/2020- Patient has no formal HEP in place.    Time 6    Period Weeks    Status New    Target Date 01/03/21             PT Long Term Goals - 11/23/20 0845      PT LONG TERM GOAL #1   Title Pt will improve DGI by at least 3 points in order to demonstrate clinically significant improvement in balance and decreased risk for falls.    Baseline 11/22/2020= DGI score= 15/24    Time 12    Period Weeks    Status New    Target Date 02/14/21      PT LONG TERM GOAL #2   Title Pt will decrease 5TSTS by at least 3 seconds in order to demonstrate clinically significant improvement in LE strength.    Baseline 11/22/2020= 12 sec    Time 12    Period Weeks    Status New    Target Date 02/14/21      PT LONG TERM GOAL #3   Title Pt will decrease TUG to below 11 seconds/decrease in order to demonstrate decreased fall risk.    Baseline 11/22/2020= 13.34 sec without an AD    Time 12    Period Weeks    Status New    Target Date 02/14/21      PT LONG TERM GOAL #4   Title Patient will increase FOTO score to equal to or  greater than 66  to demonstrate statistically significant improvement in mobility and quality of life.    Baseline 11/22/2020= FOTO= 62    Time 12    Period Weeks    Status New    Target Date 02/14/21      PT LONG TERM GOAL #5   Title Patient will report a worst pain of 3/10 on VAS (Left knee)  to improve tolerance with ADLs and reduced symptoms with activities.    Baseline 11/22/2020- Patient reports 4/10 left Lateral knee pain- worse with standing/walking    Time 12    Period Weeks    Status New    Target Date 02/14/21                 Plan - 12/22/20 1324    Clinical Impression Statement Patient tolerated session well. She responded well to walkaide with good toe extension. Patient was able to exhibit better toe extension when negotiating obstacles this session with less toe curling. She also exhibted better foot clearance and improved DF at heel strike. Instructed patient in negotiating orange hurdle with 1-0 rail assist to challenge balance/foot clearance. patient would benefit from additional skilled PT intervention to improve strength, balance and gait safety; Patient requested to reduce frequent to 1x a week moving forward due to other commitments.    Personal Factors and Comorbidities Comorbidity 2    Comorbidities Diabetes, CVA    Examination-Activity Limitations Carry;Lift;Transfers    Examination-Participation Restrictions Occupation;Cleaning;Community Activity;Shop;Yard Work    Stability/Clinical Decision Making Stable/Uncomplicated    Rehab Potential Good    PT Frequency 2x / week    PT Duration 12 weeks    PT Treatment/Interventions ADLs/Self Care Home Management;Biofeedback;Electrical Stimulation;Moist Heat;DME Instruction;Gait training;Stair training;Functional  mobility training;Therapeutic exercise;Balance training;Neuromuscular re-education;Therapeutic activities;Patient/family education;Manual techniques;Passive range of motion;Dry needling    PT Next Visit Plan  Initiate LE Strengthening and balance activities    PT Home Exercise Plan To be initiated next 1-2 visits.    Consulted and Agree with Plan of Care Patient           Patient will benefit from skilled therapeutic intervention in order to improve the following deficits and impairments:  Abnormal gait,Decreased activity tolerance,Decreased coordination,Decreased endurance,Decreased mobility,Decreased strength,Impaired tone,Impaired UE functional use,Pain  Visit Diagnosis: Muscle weakness (generalized)  Other lack of coordination  Difficulty in walking, not elsewhere classified  Other abnormalities of gait and mobility     Problem List Patient Active Problem List   Diagnosis Date Noted  . E. coli UTI   . Diabetes mellitus type 2 in obese ([place])   . Chronic pain of right knee   . 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 Hemiparesis affecting left side as late effect of stroke ([place])   . Acute ischemic right middle cerebral artery (MCA) stroke ([place]) 07/22/2019  . Carotid artery dissection  (HCC) s/p stent placement 07/21/2019  . Diabetes mellitus type II, uncontrolled ([place]) 07/21/2019  . Acute blood loss anemia 07/21/2019  . Leukocytosis 07/21/2019  . Stroke (cerebrum) ([place]) - R MCA infarct s/p tenecteplase and mechanical thrombectomy w/ d/t ICA dissection  07/15/2019  . Middle cerebral artery embolism, right 07/15/2019  . Controlled type 2 diabetes mellitus without complication, without long-term current use of insulin ([place]) 05/28/2017  . Attention deficit hyperactivity disorder (ADHD), combined type 04/18/2017  . Morbid obesity ([place]) 01/25/2016  . Umbilical hernia 79/89/2119  . Hiatal hernia 12/08/2015  . History of Clostridium difficile colitis 06/19/2014  . HTN (hypertension) 12/23/2013  . PCOS (polycystic ovarian syndrome) 12/23/2013  . Bariatric surgery status 01/28/2013    Tayden Nichelson PT, DPT 12/22/2020, 1:30 PM  [place] MAIN [facility] SERVICES [address] [place], [place], [address] Phone: [phone]   Fax:  [phone]  Name: Kathryn Coffey MRN: [id number] Date of Birth: 1971-07-03

## 2020-12-27 ENCOUNTER — Ambulatory Visit: Payer: No Typology Code available for payment source | Admitting: Physical Therapy

## 2020-12-27 ENCOUNTER — Other Ambulatory Visit: Payer: Self-pay

## 2020-12-27 MED ORDER — VYVANSE 50 MG PO CAPS
ORAL_CAPSULE | ORAL | 0 refills | Status: DC
  Filled 2020-12-27 – 2020-12-31 (×2): qty 30, 30d supply, fill #0

## 2020-12-29 ENCOUNTER — Ambulatory Visit: Payer: No Typology Code available for payment source | Admitting: Physical Therapy

## 2020-12-29 ENCOUNTER — Encounter: Payer: Self-pay | Admitting: Physical Therapy

## 2020-12-29 ENCOUNTER — Ambulatory Visit: Payer: No Typology Code available for payment source | Admitting: Occupational Therapy

## 2020-12-29 ENCOUNTER — Encounter: Payer: Self-pay | Admitting: Occupational Therapy

## 2020-12-29 ENCOUNTER — Other Ambulatory Visit: Payer: Self-pay

## 2020-12-29 DIAGNOSIS — M6281 Muscle weakness (generalized): Secondary | ICD-10-CM

## 2020-12-29 DIAGNOSIS — R2689 Other abnormalities of gait and mobility: Secondary | ICD-10-CM

## 2020-12-29 DIAGNOSIS — R278 Other lack of coordination: Secondary | ICD-10-CM

## 2020-12-29 DIAGNOSIS — R262 Difficulty in walking, not elsewhere classified: Secondary | ICD-10-CM

## 2020-12-29 DIAGNOSIS — M542 Cervicalgia: Secondary | ICD-10-CM

## 2020-12-29 DIAGNOSIS — R293 Abnormal posture: Secondary | ICD-10-CM

## 2020-12-29 NOTE — Patient Instructions (Signed)
Access Code: [email or web address] URL: [email or web address] Date: 12/29/2020 Prepared by: Blanche East  Exercises Seated Median Nerve Glide - 1 x daily - 7 x weekly - 2 sets - 10 reps Seated Shoulder Rolls - 1 x daily - 7 x weekly - 2 sets - 10 reps

## 2020-12-29 NOTE — Therapy (Signed)
[place] MAIN [facility] SERVICES [address] [place], [place], [address] Phone: [phone]   Fax:  [phone]  Physical Therapy Re Evaluation  Patient Details  Name: Kathryn Coffey MRN: [id number] Date of Birth: 01-Aug-1971 Referring Provider (PT): shah   Encounter Date: 12/29/2020   PT End of Session - 12/29/20 1157    Visit Number 7    Number of Visits 15    Date for PT Re-Evaluation 02/23/21    Authorization Time Period Eval 11/22/20 for LE weakness; Re-eval 12/29/20    PT Start Time 1102    PT Stop Time 1145    PT Time Calculation (min) 43 min    Equipment Utilized During Treatment Gait belt    Activity Tolerance Patient tolerated treatment well;No increased pain    Behavior During Therapy WFL for tasks assessed/performed           Past Medical History:  Diagnosis Date  . Abdominal wall cellulitis 2013 and 2014  . Asthma    allergic to grasses  . B12 deficiency   . Complication of anesthesia   . Costochondritis   . Diabetes mellitus without complication ([place])   . Gastric anomaly    multiple small ulcers  . GERD (gastroesophageal reflux disease)   . Herpes 06/2018   POSSIBLY IN EYE- PT TAKING VALTREX AND WILL SEE OPTHAMOLOGIST TO SEE IF VALTREX IS WORKING  . History of abnormal cervical Pap smear   . History of Clostridium difficile colitis   . History of hiatal hernia   . History of kidney stones   . Hypertension   . IBS (irritable bowel syndrome)   . Migraine   . Morbid obesity ([place])    s/p attempted gastric banding now decompressed  . PCOS (polycystic ovarian syndrome)   . PONV (postoperative nausea and vomiting)    WITH SPINAL ONLY  . Stroke [facility])     Past Surgical History:  Procedure Laterality Date  . BREAST EXCISIONAL BIOPSY Left    cyst excision - Dr. Patty Sermons  . CESAREAN SECTION    . CHOLECYSTECTOMY    . COLONOSCOPY    . EAR CYST EXCISION Right 07/04/2018   Procedure: EXCISION GLOMUS TUMOR THUMBNAIL;   Surgeon: Corky Mull, MD;  Location: [facility] ORS;  Service: Orthopedics;  Laterality: Right;  . ESOPHAGOGASTRODUODENOSCOPY    . ESOPHAGOGASTRODUODENOSCOPY (EGD) WITH PROPOFOL N/A 12/06/2015   Procedure: ESOPHAGOGASTRODUODENOSCOPY (EGD) WITH PROPOFOL;  Surgeon: Manya Silvas, MD;  Location: [facility] ENDOSCOPY;  Service: Endoscopy;  Laterality: N/A;  . IR ANGIO INTRA EXTRACRAN SEL INTERNAL CAROTID UNI L MOD SED  07/15/2019  . IR ANGIO VERTEBRAL SEL SUBCLAVIAN INNOMINATE UNI R MOD SED  07/15/2019  . IR CT HEAD LTD  07/15/2019  . IR INTRAVSC STENT CERV CAROTID W/O EMB-PROT MOD SED INC ANGIO  07/15/2019  . IR PERCUTANEOUS ART THROMBECTOMY/INFUSION INTRACRANIAL INC DIAG ANGIO  07/15/2019  . LAPAROSCOPIC GASTRIC BANDING    . LAPAROSCOPIC GASTRIC RESTRICTIVE DUODENAL PROCEDURE (DUODENAL SWITCH) N/A 01/25/2016   Procedure: LAPAROSCOPIC GASTRIC RESTRICTIVE DUODENAL PROCEDURE (DUODENAL SWITCH);  Surgeon: Ladora Daniel, MD;  Location: [facility] ORS;  Service: General;  Laterality: N/A;  . OVARY SURGERY     x2  . RADIOLOGY WITH ANESTHESIA N/A 07/15/2019   Procedure: IR WITH ANESTHESIA;  Surgeon: Luanne Bras, MD;  Location: [place];  Service: Radiology;  Laterality: N/A;  . TONSILLECTOMY    . TUBAL LIGATION    . UMBILICAL HERNIA REPAIR N/A 01/25/2016  Procedure: LAPAROSCOPIC UMBILICAL HERNIA;  Surgeon: Ladora Daniel, MD;  Location: [facility] ORS;  Service: General;  Laterality: N/A;  . UMBILICAL HERNIA REPAIR N/A 10/20/2016   Procedure: HERNIA REPAIR UMBILICAL ADULT;  Surgeon: Leonie Green, MD;  Location: [facility] ORS;  Service: General;  Laterality: N/A;    There were no vitals filed for this visit.    Subjective Assessment - 12/29/20 1106    Subjective Patient reports doing well; She presents to therapy in new sandals and reports that her walking is improving. She isn't sure if its related to the walkaide or if its just getting better on its own but she doens't feel like her toes are curling as much; In  addition, patient reports chronic neck pain with RUE radiculopathy (1-2 months). She reports the numbness in her arm started gradual but has become more progressive and is now most of the hand. She reports her UE is constantly numb and is causing issues with her ADLs including dropping things. She does have an appointment with Dr. Sharlet Salina for steriod injection on 5/17.    Patient is accompained by: Family member   husband, Kathryn Coffey   Pertinent History 50 year old female with history of CVA on 07/15/2019 with left sided weakness. Patient has PMH significant for DM, GERD, HTN, Asthma, Herpes, Costochondritis, Vit B12 deficiency. Patient did get repeat MRI with Dr. Manuella Ghazi with consistent bulging disk at C5-C6 with right side radiculopathy;    Limitations Lifting;Standing;Walking    How long can you sit comfortably? no restrictions    How long can you stand comfortably? Limited with prolonged standing - around 30 min    How long can you walk comfortably? around 30 min    Patient Stated Goals I want to improve my strength and increase my stability with standing/walking    Currently in Pain? Yes    Pain Score 2     Pain Location Neck    Pain Orientation Right    Pain Descriptors / Indicators Aching;Sore    Pain Type Chronic pain    Pain Onset Other (comment)   Chronic since 06/2019   Pain Frequency Other (Comment)    Aggravating Factors  prolonged use of hands;    Pain Relieving Factors neck massager helps    Effect of Pain on Daily Activities difficulty with ADLs; will wake up at night sometimes and has to reposition;    Multiple Pain Sites No              [facility] PT Assessment - 12/29/20 0001      Assessment   Medical Diagnosis Neck pain/RUE radiuclopathy    Referring Provider (PT) shah    Onset Date/Surgical Date --   neck pain for >5 years, numbness in hand 1-2 months   Hand Dominance Right    Next MD Visit Seeing Dr. Sharlet Salina 01/04/21    Prior Therapy Outpatient OT/PT      Precautions    Precautions Fall      Restrictions   Weight Bearing Restrictions No      Balance Screen   Has the patient fallen in the past 6 months No    Has the patient had a decrease in activity level because of a fear of falling?  No    Is the patient reluctant to leave their home because of a fear of falling?  No      Observation/Other Assessments   Observations has moderate tenderness along right upper trap/levator scapulae with trigger points present;  Patient reports increased numbness with palpation to right scalene; (+) spurlings for nerve impingement with neck extension and right rotation      Sensation   Light Touch Appears Intact   does have numbness in right hand but able to identify light touch     Coordination   Gross Motor Movements are Fluid and Coordinated Yes    Fine Motor Movements are Fluid and Coordinated Not tested      Posture/Postural Control   Posture Comments sits with slumped posture with moderate forward rounded shoulders, able to self correct with cues      AROM   AROM Assessment Site Cervical    Cervical Flexion 35    Cervical Extension 30    Cervical - Right Side Bend 30    Cervical - Left Side Bend 35    Cervical - Right Rotation 55    Cervical - Left Rotation 65      Palpation   Spinal mobility hypomobility noted throughout cervical spine with central PA Mobs; Reports less numbness/pain with gentle cervical distraction      Special Tests    Special Tests Cervical    Other special tests (+) ULNTTs for median and ulnar on RUE, (-) radial nerve tension on RUE; (+) phalens and reverse phalens test on RUE for increased numbness; Does report nausea with vertebral artery insufficiency on right, no dizziness and no nausea or dizziness on left    Cervical Tests Spurling's      Spurling's   Findings Positive    Side Right              TREATMENT: PT applied walkaide to LLE, exercise mode 3 sec on, 5 sec off x5 min at tolerated intensity, concurrent with  history intake;  PT assessed patient for cervicalgia and RUE radiculopathy, see above;  Instructed pt in RUE median nerve glide and posterior shoulder roll as part of HEP, see patient instructions;   Pt ambulated around gym x500 feet with walkaide on LLE at tolerated intensity to facilitate better ankle DF and toe extension for improved foot clearance; Patient tolerated well with minimal toe curling noted  Educated patient in process for obtaining home walkaide unit. She will continue to think about it.       Objective measurements completed on examination: See above findings.               PT Education - 12/29/20 1156    Education Details cervical findings/plan of care; HEP    Person(s) Educated Patient    Methods Explanation;Verbal cues;Handout    Comprehension Verbalized understanding;Returned demonstration;Verbal cues required;Need further instruction            PT Short Term Goals - 12/29/20 1209      PT SHORT TERM GOAL #1   Title Pt will be independent with HEP in order to improve strength and balance in order to decrease fall risk and improve function at home and work.    Baseline 11/22/2020- Patient has no formal HEP in place.    Time 4    Period Weeks    Status New    Target Date 01/26/21      PT SHORT TERM GOAL #2   Title Patient will increase cervical AROM rotation >60 degrees bilaterally for increased functional ROM for turning head when driving;    Time 4    Period Weeks    Status New    Target Date 01/26/21  PT Long Term Goals - 12/29/20 1210      PT LONG TERM GOAL #1   Title Pt will improve DGI by at least 3 points in order to demonstrate clinically significant improvement in balance and decreased risk for falls.    Baseline 11/22/2020= DGI score= 15/24    Time 8    Period Weeks    Status New    Target Date 02/23/21      PT LONG TERM GOAL #2   Title Pt will report reduction in numbness in right hand by 50% to improve  tolerance with ADLs and increase safety within home    Baseline 5/11- numbness in RUE hand is constant    Time 8    Period Weeks    Status New    Target Date 02/23/21      PT LONG TERM GOAL #3   Title Pt will decrease TUG to below 11 seconds/decrease in order to demonstrate decreased fall risk.    Baseline 11/22/2020= 13.34 sec without an AD    Time 8    Period Weeks    Status New    Target Date 02/23/21      PT LONG TERM GOAL #4   Title Patient will increase FOTO score to equal to or greater than 66  to demonstrate statistically significant improvement in mobility and quality of life.    Baseline 11/22/2020= FOTO= 62    Time 8    Period Weeks    Status New    Target Date 02/23/21      PT LONG TERM GOAL #5   Title Patient will report a worst pain of 3/10 on VAS (neck/RUE)  to improve tolerance with ADLs and reduced symptoms with activities.    Baseline 5/11- reports variable pain in neck/RUE    Time 8    Period Weeks    Status New    Target Date 02/23/21                  Plan - 12/29/20 1157    Clinical Impression Statement Patient motivated and participated well within session. She continues to respond well to walkaide with good ankle DF and toe extension during gait which improves walking ability; PT educated patient in options regarding home walkaide unit including cost and process for getting insurance authorization. Patient will continue to think about the walkaide at this time. PT re-evaluated patient for neck pain and RUE radiculopathy. She reports chronic neck pain for last 5+ years however has started having numbness in RUE hand over last 1-2 months. Patient had repeat MRI which was (+) for C5-C6 disc bulge. She reports increased tenderness with palpation along right upper trap/levator scapulate and scalenes. Patient also tested (+) for upper limb nerve tension tests for median and ulnar nerve as well as phalens and reverse phalens indicating possible peripheral nerve  impingement. Patient reports less numbness with gentle cervical distraction. She does have a history of right carotid artery dissection in November 2020 which led to right MCA stroke; She underwent stent placement at that time; Patient would benefit from skilled PT Intervention to improve cervical ROM, reduce pain/radicular symptoms down RUE for better ADL tolerance. Patient requested to continue therapy at 1x a week at this time due to other time constraints this summer.    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    Recommended Other Services OT    Consulted and Agree with Plan of Care Patient           Patient will benefit from skilled therapeutic intervention in order to improve the following deficits and impairments:  Abnormal gait,Decreased activity tolerance,Decreased coordination,Decreased endurance,Decreased mobility,Decreased strength,Impaired tone,Impaired UE functional use,Pain,Hypomobility,Decreased range of motion,Postural dysfunction  Visit Diagnosis: Muscle weakness (generalized)  Other lack of coordination  Difficulty in walking, not elsewhere classified  Other  abnormalities of gait and mobility  Cervicalgia  Abnormal posture     Problem List Patient Active Problem List   Diagnosis Date Noted  . E. coli UTI   . Diabetes mellitus type 2 in obese ([place])   . Chronic pain of right knee   . 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 Hemiparesis affecting left side as late effect of stroke ([place])   . Acute ischemic right middle cerebral artery (MCA) stroke ([place]) 07/22/2019  . Carotid artery dissection  ([facility]) s/p stent placement 07/21/2019  . Diabetes mellitus type II, uncontrolled ([place]) 07/21/2019  . Acute blood loss anemia 07/21/2019  . Leukocytosis 07/21/2019  . Stroke (cerebrum) ([place]) - R MCA infarct s/p tenecteplase and mechanical thrombectomy w/ d/t ICA dissection  07/15/2019  . Middle cerebral artery embolism, right 07/15/2019  . Controlled type 2 diabetes mellitus without complication, without long-term current use of insulin ([place]) 05/28/2017  . Attention deficit hyperactivity disorder (ADHD), combined type 04/18/2017  . Morbid obesity ([place]) 01/25/2016  . Umbilical hernia [id number]  . Hiatal hernia 12/08/2015  . History of Clostridium difficile colitis 06/19/2014  . HTN (hypertension) 12/23/2013  . PCOS (polycystic ovarian syndrome) 12/23/2013  . Bariatric surgery status 01/28/2013    Shenae Bonanno PT, DPT 12/29/2020, 12:13 PM  [place] [facility] SERVICES [address] [place], [place], [address] Phone: [phone]   Fax:  [phone]  Name: SEDA TISCHNER MRN: [id number] Date of Birth: [birth year]

## 2020-12-31 ENCOUNTER — Other Ambulatory Visit: Payer: Self-pay

## 2020-12-31 MED FILL — Losartan Potassium & Hydrochlorothiazide Tab 100-12.5 MG: ORAL | 30 days supply | Qty: 30 | Fill #1 | Status: AC

## 2020-12-31 MED FILL — Triamterene & Hydrochlorothiazide Cap 37.5-25 MG: ORAL | 90 days supply | Qty: 90 | Fill #1 | Status: AC

## 2021-01-01 NOTE — Therapy (Signed)
[place] MAIN [facility] SERVICES [address] [place], [place], [address] Phone: [phone]   Fax:  [phone]  Occupational Therapy Treatment  Patient Details  Name: Kathryn Coffey MRN: [id number] Date of Birth: January 26, 1971 No data recorded  Encounter Date: 12/29/2020   OT End of Session - 01/01/21 1345    Visit Number 4    Number of Visits 25    Date for OT Re-Evaluation 02/28/21    Authorization Type Progress report period starting  on 12/06/20    OT Start Time 1016    OT Stop Time 1100    OT Time Calculation (min) 44 min    Activity Tolerance Patient tolerated treatment well    Behavior During Therapy [facility] for tasks assessed/performed           Past Medical History:  Diagnosis Date  . Abdominal wall cellulitis 2013 and 2014  . Asthma    allergic to grasses  . B12 deficiency   . Complication of anesthesia   . Costochondritis   . Diabetes mellitus without complication ([place])   . Gastric anomaly    multiple small ulcers  . GERD (gastroesophageal reflux disease)   . Herpes 06/2018   POSSIBLY IN EYE- PT TAKING VALTREX AND WILL SEE OPTHAMOLOGIST TO SEE IF VALTREX IS WORKING  . History of abnormal cervical Pap smear   . History of Clostridium difficile colitis   . History of hiatal hernia   . History of kidney stones   . Hypertension   . IBS (irritable bowel syndrome)   . Migraine   . Morbid obesity ([place])    s/p attempted gastric banding now decompressed  . PCOS (polycystic ovarian syndrome)   . PONV (postoperative nausea and vomiting)    WITH SPINAL ONLY  . Stroke [facility])     Past Surgical History:  Procedure Laterality Date  . BREAST EXCISIONAL BIOPSY Left    cyst excision - Dr. Patty Sermons  . CESAREAN SECTION    . CHOLECYSTECTOMY    . COLONOSCOPY    . EAR CYST EXCISION Right 07/04/2018   Procedure: EXCISION GLOMUS TUMOR THUMBNAIL;  Surgeon: Corky Mull, MD;  Location: [facility] ORS;  Service: Orthopedics;  Laterality:  Right;  . ESOPHAGOGASTRODUODENOSCOPY    . ESOPHAGOGASTRODUODENOSCOPY (EGD) WITH PROPOFOL N/A 12/06/2015   Procedure: ESOPHAGOGASTRODUODENOSCOPY (EGD) WITH PROPOFOL;  Surgeon: Manya Silvas, MD;  Location: [facility] ENDOSCOPY;  Service: Endoscopy;  Laterality: N/A;  . IR ANGIO INTRA EXTRACRAN SEL INTERNAL CAROTID UNI L MOD SED  07/15/2019  . IR ANGIO VERTEBRAL SEL SUBCLAVIAN INNOMINATE UNI R MOD SED  07/15/2019  . IR CT HEAD LTD  07/15/2019  . IR INTRAVSC STENT CERV CAROTID W/O EMB-PROT MOD SED INC ANGIO  07/15/2019  . IR PERCUTANEOUS ART THROMBECTOMY/INFUSION INTRACRANIAL INC DIAG ANGIO  07/15/2019  . LAPAROSCOPIC GASTRIC BANDING    . LAPAROSCOPIC GASTRIC RESTRICTIVE DUODENAL PROCEDURE (DUODENAL SWITCH) N/A 01/25/2016   Procedure: LAPAROSCOPIC GASTRIC RESTRICTIVE DUODENAL PROCEDURE (DUODENAL SWITCH);  Surgeon: Ladora Daniel, MD;  Location: [facility] ORS;  Service: General;  Laterality: N/A;  . OVARY SURGERY     x2  . RADIOLOGY WITH ANESTHESIA N/A 07/15/2019   Procedure: IR WITH ANESTHESIA;  Surgeon: Luanne Bras, MD;  Location: [place];  Service: Radiology;  Laterality: N/A;  . TONSILLECTOMY    . TUBAL LIGATION    . UMBILICAL HERNIA REPAIR N/A 01/25/2016   Procedure: LAPAROSCOPIC UMBILICAL HERNIA;  Surgeon: Ladora Daniel, MD;  Location: [facility] ORS;  Service: General;  Laterality: N/A;  . UMBILICAL HERNIA REPAIR N/A 10/20/2016   Procedure: HERNIA REPAIR UMBILICAL ADULT;  Surgeon: Leonie Green, MD;  Location: [facility] ORS;  Service: General;  Laterality: N/A;    There were no vitals filed for this visit.   Subjective Assessment - 01/01/21 1342    Subjective  Pt reports she is doing well, has been trying to cook at home, reports difficulty with typing but has been working on placing hand on the keyboard.    Pertinent History 50 year old female with history of CVA on 07/15/2019 with left sided weakness. Pt previously completed inpatient rehab and outpatient therapy services. Patient has PMH  significant for DM, GERD, HTN, Asthma, Herpes, Costochondritis, Vit B12 deficiency.    Patient Stated Goals Patient would like to be as independent as possible, return to her prior level of function    Currently in Pain? Yes    Pain Score 2     Pain Location Neck    Pain Orientation Right    Pain Descriptors / Indicators Aching;Sore    Pain Type Chronic pain           Patient seen for hand exercises as follows:    7  hand exercises to help promote handwriting, dexterity and flexibility. 1)  Fisting with arm then arm extension with fingers extended with BIG hand movements 2) oppositional movements of the thumb to each digit 3) wrist flex/ext with elbows extended 4) supination/pronation 5) tendon gliding with hook fist and then finger extension 6) tendon gliding with tabletop movement of fingers with MP flexion, PIP and DIP extension 7) MP flexion, PIP flexion, DIP extension Performed with therapist demo and occasional cues for form  Typing skills: Pt seen for multiple trials of typing drills with emphasis on left hand placement on keyboard.   Typing tests this date with average of 12 WPM with pt correcting all mistakes while performing tests.    Pt instructed on focusing on home row keys with typing drills at home, rec using [email or web address].    Response to tx: Patient progressing well, performed baseline typing test this date resulting in 12 WPM average, she reports her average before the stroke was probably closer to 100 WPM.  Pt to work towards select typing drills and tests at home for practice with emphasis on left hand placement on keyboard.  Encouraged hand and finger exercises prior to any typing or tasks which demand fine motor coordination skills.  Continue OT towards goals in plan of care to maximize safety and independence in necessary daily tasks.                      OT Education - 01/01/21 1345    Education Details HEP, typing skills, drills     Person(s) Educated Patient    Methods Explanation;Demonstration;Handout    Comprehension Verbalized understanding;Returned demonstration            OT Short Term Goals - 12/06/20 1525      OT SHORT TERM GOAL #1   Title Pt will independently complete HEP.    Baseline eval: pt completes mirror box therapy and Magna exercises    Time 6    Period Weeks    Status New    Target Date 01/17/21      OT SHORT TERM GOAL #2   Title Pt will cut meat with MOD I utilizing adaptive equipment as needed    Baseline Eval: uses good grips,  requires MIN A    Time 6    Period Weeks    Status New    Target Date 01/17/21             OT Long Term Goals - 12/08/20 1149      OT LONG TERM GOAL #1   Title Pt will increase left shoulder flexion AROM by 20 degrees to complete hair washing/brushing with LUE.    Baseline Eval: seated shoulder flexion AROM 125*    Time 12    Period Weeks    Status New      OT LONG TERM GOAL #2   Title Pt will increase left grip by 15# to assist with picking up objects and opening cans.    Baseline Eval: L grip 25#    Time 12    Period Weeks    Status New      OT LONG TERM GOAL #3   Title Pt will Independently don/doff bra in sitting    Baseline Eval: unable to hook/unhook    Time 12    Period Weeks    Status New      OT LONG TERM GOAL #4   Title Pt will improve L hand PEG time by 1 min to improve [place] and manipulate small objects during I/ADLs including folding laundry.    Baseline Eval: PEG L 3 min 59 sec    Time 12    Period Weeks    Status New      OT LONG TERM GOAL #5   Title Pt will improve FOTO score 49 or greater to show clinically relevant change to engage in ADL and IADL tasks with greater independence    Baseline Eval: FOTO 45    Time 12    Period Weeks    Status New    Target Date 02/28/21                 Plan - 01/01/21 1346    Clinical Impression Statement Patient progressing well, performed baseline typing test this date  resulting in 12 WPM average, she reports her average before the stroke was probably closer to 100 WPM.  Pt to work towards select typing drills and tests at home for practice with emphasis on left hand placement on keyboard.  Encouraged hand and finger exercises prior to any typing or tasks which demand fine motor coordination skills.  Continue OT towards goals in plan of care to maximize safety and independence in necessary daily tasks.    OT Occupational Profile and History Detailed Assessment- Review of Records and additional review of physical, cognitive, psychosocial history related to current functional performance    Occupational performance deficits (Please refer to evaluation for details): ADL's;IADL's    Rehab Potential Good    Clinical Decision Making Several treatment options, min-mod task modification necessary    Comorbidities Affecting Occupational Performance: May have comorbidities impacting occupational performance    Modification or Assistance to Complete Evaluation  Min-Moderate modification of tasks or assist with assess necessary to complete eval    OT Frequency 2x / week    OT Duration 12 weeks    OT Treatment/Interventions Self-care/ADL training;Moist Heat;DME and/or AE instruction;Neuromuscular education;Therapeutic exercise;Therapeutic activities;Energy conservation;Patient/family education    OT Home Exercise Plan theraputty    Consulted and Agree with Plan of Care Patient           Patient will benefit from skilled therapeutic intervention in order to improve the following deficits and impairments:  Visit Diagnosis: Muscle weakness (generalized)  Other lack of coordination    Problem List Patient Active Problem List   Diagnosis Date Noted  . E. coli UTI   . Diabetes mellitus type 2 in obese ([place])   . Chronic pain of right knee   . 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 Hemiparesis affecting left side as late effect of stroke ([place])    . Acute ischemic right middle cerebral artery (MCA) stroke ([place]) 07/22/2019  . Carotid artery dissection  (HCC) s/p stent placement 07/21/2019  . Diabetes mellitus type II, uncontrolled ([place]) 07/21/2019  . Acute blood loss anemia 07/21/2019  . Leukocytosis 07/21/2019  . Stroke (cerebrum) ([place]) - R MCA infarct s/p tenecteplase and mechanical thrombectomy w/ d/t ICA dissection  07/15/2019  . Middle cerebral artery embolism, right 07/15/2019  . Controlled type 2 diabetes mellitus without complication, without long-term current use of insulin ([place]) 05/28/2017  . Attention deficit hyperactivity disorder (ADHD), combined type 04/18/2017  . Morbid obesity ([place]) 01/25/2016  . Umbilical hernia [id number]  . Hiatal hernia 12/08/2015  . History of Clostridium difficile colitis 06/19/2014  . HTN (hypertension) 12/23/2013  . PCOS (polycystic ovarian syndrome) 12/23/2013  . Bariatric surgery status 01/28/2013   Achilles Dunk, OTR/L, CLT  Austen Oyster 01/01/2021, 1:57 PM  [place] MAIN [facility] SERVICES [address] [place], [place], [address] Phone: [phone]   Fax:  [phone]  Name: NIYLAH DEJULIO MRN: [id number] Date of Birth: May 08, 1971

## 2021-01-03 ENCOUNTER — Ambulatory Visit: Payer: No Typology Code available for payment source | Admitting: Physical Therapy

## 2021-01-03 ENCOUNTER — Other Ambulatory Visit: Payer: Self-pay

## 2021-01-03 MED FILL — Mirabegron Tab ER 24 HR 50 MG: ORAL | 30 days supply | Qty: 30 | Fill #1 | Status: CN

## 2021-01-04 ENCOUNTER — Other Ambulatory Visit: Payer: Self-pay

## 2021-01-04 MED ORDER — DIPHENHYDRAMINE HCL 50 MG PO CAPS: ORAL_CAPSULE | ORAL | 0 refills | Status: DC

## 2021-01-04 MED ORDER — PREDNISONE 50 MG PO TABS
ORAL_TABLET | ORAL | 0 refills | Status: DC
  Filled 2021-01-04: qty 3, 1d supply, fill #0

## 2021-01-05 ENCOUNTER — Ambulatory Visit: Payer: No Typology Code available for payment source | Admitting: Occupational Therapy

## 2021-01-05 ENCOUNTER — Ambulatory Visit: Payer: No Typology Code available for payment source | Admitting: Physical Therapy

## 2021-01-10 ENCOUNTER — Other Ambulatory Visit: Payer: Self-pay

## 2021-01-10 ENCOUNTER — Ambulatory Visit: Payer: No Typology Code available for payment source | Admitting: Physical Therapy

## 2021-01-10 MED FILL — Escitalopram Oxalate Tab 10 MG (Base Equiv): ORAL | 30 days supply | Qty: 30 | Fill #1 | Status: AC

## 2021-01-10 MED FILL — Etodolac Tab 400 MG: ORAL | 30 days supply | Qty: 30 | Fill #0 | Status: AC

## 2021-01-10 MED FILL — Dulaglutide Soln Auto-injector 1.5 MG/0.5ML: SUBCUTANEOUS | 28 days supply | Qty: 2 | Fill #1 | Status: AC

## 2021-01-11 ENCOUNTER — Other Ambulatory Visit: Payer: Self-pay

## 2021-01-11 MED FILL — Mirabegron Tab ER 24 HR 50 MG: ORAL | 30 days supply | Qty: 30 | Fill #1 | Status: CN

## 2021-01-12 ENCOUNTER — Other Ambulatory Visit: Payer: Self-pay

## 2021-01-12 ENCOUNTER — Encounter: Payer: Self-pay | Admitting: Physical Therapy

## 2021-01-12 ENCOUNTER — Ambulatory Visit: Payer: No Typology Code available for payment source | Admitting: Physical Therapy

## 2021-01-12 DIAGNOSIS — R2689 Other abnormalities of gait and mobility: Secondary | ICD-10-CM

## 2021-01-12 DIAGNOSIS — M6281 Muscle weakness (generalized): Secondary | ICD-10-CM | POA: Diagnosis not present

## 2021-01-12 DIAGNOSIS — R293 Abnormal posture: Secondary | ICD-10-CM

## 2021-01-12 DIAGNOSIS — R262 Difficulty in walking, not elsewhere classified: Secondary | ICD-10-CM

## 2021-01-12 DIAGNOSIS — R278 Other lack of coordination: Secondary | ICD-10-CM

## 2021-01-12 DIAGNOSIS — M542 Cervicalgia: Secondary | ICD-10-CM

## 2021-01-12 NOTE — Therapy (Signed)
[place] MAIN [facility] SERVICES [address] [place], [place], [address] Phone: [phone]   Fax:  [phone]  Physical Therapy Treatment  Patient Details  Name: Kathryn Coffey MRN: [id number] Date of Birth: 1971-03-29 Referring Provider (PT): shah   Encounter Date: 01/12/2021   PT End of Session - 01/12/21 1145    Visit Number 8    Number of Visits 15    Date for PT Re-Evaluation 02/23/21    Authorization Time Period Eval 11/22/20 for LE weakness; Re-eval 12/29/20    PT Start Time 1145    PT Stop Time 1230    PT Time Calculation (min) 45 min    Activity Tolerance Patient tolerated treatment well;No increased pain    Behavior During Therapy WFL for tasks assessed/performed           Past Medical History:  Diagnosis Date  . Abdominal wall cellulitis 2013 and 2014  . Asthma    allergic to grasses  . B12 deficiency   . Complication of anesthesia   . Costochondritis   . Diabetes mellitus without complication ([place])   . Gastric anomaly    multiple small ulcers  . GERD (gastroesophageal reflux disease)   . Herpes 06/2018   POSSIBLY IN EYE- PT TAKING VALTREX AND WILL SEE OPTHAMOLOGIST TO SEE IF VALTREX IS WORKING  . History of abnormal cervical Pap smear   . History of Clostridium difficile colitis   . History of hiatal hernia   . History of kidney stones   . Hypertension   . IBS (irritable bowel syndrome)   . Migraine   . Morbid obesity ([place])    s/p attempted gastric banding now decompressed  . PCOS (polycystic ovarian syndrome)   . PONV (postoperative nausea and vomiting)    WITH SPINAL ONLY  . Stroke [facility])     Past Surgical History:  Procedure Laterality Date  . BREAST EXCISIONAL BIOPSY Left    cyst excision - Dr. Patty Sermons  . CESAREAN SECTION    . CHOLECYSTECTOMY    . COLONOSCOPY    . EAR CYST EXCISION Right 07/04/2018   Procedure: EXCISION GLOMUS TUMOR THUMBNAIL;  Surgeon: Corky Mull, MD;  Location: [facility] ORS;   Service: Orthopedics;  Laterality: Right;  . ESOPHAGOGASTRODUODENOSCOPY    . ESOPHAGOGASTRODUODENOSCOPY (EGD) WITH PROPOFOL N/A 12/06/2015   Procedure: ESOPHAGOGASTRODUODENOSCOPY (EGD) WITH PROPOFOL;  Surgeon: Manya Silvas, MD;  Location: [facility] ENDOSCOPY;  Service: Endoscopy;  Laterality: N/A;  . IR ANGIO INTRA EXTRACRAN SEL INTERNAL CAROTID UNI L MOD SED  07/15/2019  . IR ANGIO VERTEBRAL SEL SUBCLAVIAN INNOMINATE UNI R MOD SED  07/15/2019  . IR CT HEAD LTD  07/15/2019  . IR INTRAVSC STENT CERV CAROTID W/O EMB-PROT MOD SED INC ANGIO  07/15/2019  . IR PERCUTANEOUS ART THROMBECTOMY/INFUSION INTRACRANIAL INC DIAG ANGIO  07/15/2019  . LAPAROSCOPIC GASTRIC BANDING    . LAPAROSCOPIC GASTRIC RESTRICTIVE DUODENAL PROCEDURE (DUODENAL SWITCH) N/A 01/25/2016   Procedure: LAPAROSCOPIC GASTRIC RESTRICTIVE DUODENAL PROCEDURE (DUODENAL SWITCH);  Surgeon: Ladora Daniel, MD;  Location: [facility] ORS;  Service: General;  Laterality: N/A;  . OVARY SURGERY     x2  . RADIOLOGY WITH ANESTHESIA N/A 07/15/2019   Procedure: IR WITH ANESTHESIA;  Surgeon: Luanne Bras, MD;  Location: [place];  Service: Radiology;  Laterality: N/A;  . TONSILLECTOMY    . TUBAL LIGATION    . UMBILICAL HERNIA REPAIR N/A 01/25/2016   Procedure: LAPAROSCOPIC UMBILICAL HERNIA;  Surgeon: Ladora Daniel, MD;  Location: [facility] ORS;  Service: General;  Laterality: N/A;  . UMBILICAL HERNIA REPAIR N/A 10/20/2016   Procedure: HERNIA REPAIR UMBILICAL ADULT;  Surgeon: Leonie Green, MD;  Location: [facility] ORS;  Service: General;  Laterality: N/A;    There were no vitals filed for this visit.   Subjective Assessment - 01/12/21 1159    Subjective Patient reports continued RUE discomfort. She is scheduled for steriod injection for Friday for RUE discomfort. Patient also states, "I brought my SAEBO e-stim to see if I can use that for my legs."    Patient is accompained by: Family member   husband, Ray   Pertinent History 50 year old female with  history of CVA on 07/15/2019 with left sided weakness. Patient has PMH significant for DM, GERD, HTN, Asthma, Herpes, Costochondritis, Vit B12 deficiency. Patient did get repeat MRI with Dr. Manuella Ghazi with consistent bulging disk at C5-C6 with right side radiculopathy;    Limitations Lifting;Standing;Walking    How long can you sit comfortably? no restrictions    How long can you stand comfortably? Limited with prolonged standing - around 30 min    How long can you walk comfortably? around 30 min    Patient Stated Goals I want to improve my strength and increase my stability with standing/walking    Currently in Pain? Yes    Pain Score 3     Pain Location Neck    Pain Orientation Right    Pain Descriptors / Indicators Aching;Sore    Pain Type Chronic pain    Pain Onset Other (comment)   Chronic since 06/2019   Pain Frequency Intermittent    Aggravating Factors  prolonged use of hands/reaching/lifting    Pain Relieving Factors neck massager    Effect of Pain on Daily Activities decreased ADL tolerance;    Multiple Pain Sites No           TREATMENT:   Patient presents to therapy with SAEBO E-stim unit PT educated pain in how to don electrodes for LLE lower leg anterior tib activation  Instructed patient to use device for 10-15 min daily to facilitate better toe extension/anterior tib strengthening to improve gait ability;  Patient supine with moist heat to cervical parasipnals for comfort  PT performed suboccipital release 30 sec hold, 10 sec rest x5 min with good tolerance; PT performed passive cervical stretches: -upper trap stretch 30 sec hold x2 reps bilaterally; -passive cervical rotation x5 reps each direction -Passive cervical lateral translation x5 reps each direction  PT performed soft tissue massage to right upper trap/levator scapulae utilizing cross friction and myofascial release techniques x15 min; Patient tolerated well reports less pain/numbness down RUE  PT  performed gentle cervical distraction 30 sec hold, 15 sec rest x5 min with reports of less numbness/tingling in RUE hand with distraction;  PT performed gentle cervical distraction with central PA mobs grade 1 to facilitate better mobility and reduce discomfort x2-3 min;  Finished with suboccipital release with instructing patient in chin tuck 5 sec hold x5 reps;  Patient tolerated session well. She reports no pain down RUE and minimal numbness. She also reports no tightness in right UE. She reports, "I feel a lot better."                          PT Education - 01/12/21 1145    Education Details cervical ROM/manual therapy, HEP    Person(s) Educated Patient    Methods Explanation;Verbal cues  Comprehension Verbalized understanding;Returned demonstration;Verbal cues required;Need further instruction            PT Short Term Goals - 12/29/20 1209      PT SHORT TERM GOAL #1   Title Pt will be independent with HEP in order to improve strength and balance in order to decrease fall risk and improve function at home and work.    Baseline 11/22/2020- Patient has no formal HEP in place.    Time 4    Period Weeks    Status New    Target Date 01/26/21      PT SHORT TERM GOAL #2   Title Patient will increase cervical AROM rotation >60 degrees bilaterally for increased functional ROM for turning head when driving;    Time 4    Period Weeks    Status New    Target Date 01/26/21             PT Long Term Goals - 12/29/20 1210      PT LONG TERM GOAL #1   Title Pt will improve DGI by at least 3 points in order to demonstrate clinically significant improvement in balance and decreased risk for falls.    Baseline 11/22/2020= DGI score= 15/24    Time 8    Period Weeks    Status New    Target Date 02/23/21      PT LONG TERM GOAL #2   Title Pt will report reduction in numbness in right hand by 50% to improve tolerance with ADLs and increase safety within home     Baseline 5/11- numbness in RUE hand is constant    Time 8    Period Weeks    Status New    Target Date 02/23/21      PT LONG TERM GOAL #3   Title Pt will decrease TUG to below 11 seconds/decrease in order to demonstrate decreased fall risk.    Baseline 11/22/2020= 13.34 sec without an AD    Time 8    Period Weeks    Status New    Target Date 02/23/21      PT LONG TERM GOAL #4   Title Patient will increase FOTO score to equal to or greater than 66  to demonstrate statistically significant improvement in mobility and quality of life.    Baseline 11/22/2020= FOTO= 62    Time 8    Period Weeks    Status New    Target Date 02/23/21      PT LONG TERM GOAL #5   Title Patient will report a worst pain of 3/10 on VAS (neck/RUE)  to improve tolerance with ADLs and reduced symptoms with activities.    Baseline 5/11- reports variable pain in neck/RUE    Time 8    Period Weeks    Status New    Target Date 02/23/21                 Plan - 01/13/21 2426    Clinical Impression Statement Patient motivated and participated well within session. Patient presents with SAEBO estim unit. PT educated patient in how to apply to LLE lower leg for anterior tib activation. Patient able to exhibit good ankle DF/toe extension with muscle e-stim unit. Instructed patient to use daily for 10-15 min each day. Patient reports continued pain down RUE with numbness/tingling in fingers. PT performed extensive manual therapy to cervical paraspinals, with good tolerance. Patient reports no pain and minimal tightness at end of session. She  is scheduled for steriod injection on Friday. She would benefit from additional skilled PT intervention to improve ROM and reduce pain with ADLs.    Personal Factors and Comorbidities Comorbidity 2    Comorbidities Diabetes, CVA    Examination-Activity Limitations Carry;Lift;Transfers    Examination-Participation Restrictions Occupation;Cleaning;Community Activity;Shop;Yard Work     Stability/Clinical Decision Making Stable/Uncomplicated    Rehab Potential Good    PT Frequency 1x / week    PT Duration 8 weeks    PT Treatment/Interventions ADLs/Self Care Home Management;Biofeedback;Electrical Stimulation;Moist Heat;DME Instruction;Gait training;Stair training;Functional mobility training;Therapeutic exercise;Balance training;Neuromuscular re-education;Therapeutic activities;Patient/family education;Manual techniques;Passive range of motion;Dry needling    PT Next Visit Plan continue with walkaide, work on cervical ROM/postural strengthening    PT Home Exercise Plan To be initiated next 1-2 visits.    Consulted and Agree with Plan of Care Patient           Patient will benefit from skilled therapeutic intervention in order to improve the following deficits and impairments:  Abnormal gait,Decreased activity tolerance,Decreased coordination,Decreased endurance,Decreased mobility,Decreased strength,Impaired tone,Impaired UE functional use,Pain,Hypomobility,Decreased range of motion,Postural dysfunction  Visit Diagnosis: Muscle weakness (generalized)  Other lack of coordination  Difficulty in walking, not elsewhere classified  Other abnormalities of gait and mobility  Cervicalgia  Abnormal posture     Problem List Patient Active Problem List   Diagnosis Date Noted  . E. coli UTI   . Diabetes mellitus type 2 in obese ([place])   . Chronic pain of right knee   . 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 Hemiparesis affecting left side as late effect of stroke ([place])   . Acute ischemic right middle cerebral artery (MCA) stroke ([place]) 07/22/2019  . Carotid artery dissection  (HCC) s/p stent placement 07/21/2019  . Diabetes mellitus type II, uncontrolled ([place]) 07/21/2019  . Acute blood loss anemia 07/21/2019  . Leukocytosis 07/21/2019  . Stroke (cerebrum) ([place]) - R MCA infarct s/p tenecteplase and mechanical thrombectomy w/ d/t ICA dissection  07/15/2019  .  Middle cerebral artery embolism, right 07/15/2019  . Controlled type 2 diabetes mellitus without complication, without long-term current use of insulin ([place]) 05/28/2017  . Attention deficit hyperactivity disorder (ADHD), combined type 04/18/2017  . Morbid obesity ([place]) 01/25/2016  . Umbilical hernia 46/80/3212  . Hiatal hernia 12/08/2015  . History of Clostridium difficile colitis 06/19/2014  . HTN (hypertension) 12/23/2013  . PCOS (polycystic ovarian syndrome) 12/23/2013  . Bariatric surgery status 01/28/2013    Charlsie Fleeger PT, DPT 01/13/2021, 8:20 AM  [place] MAIN [facility] SERVICES [address] [place], [place], [address] Phone: [phone]   Fax:  [phone]  Name: Kathryn Coffey MRN: [id number] Date of Birth: [birth year]

## 2021-01-17 ENCOUNTER — Other Ambulatory Visit: Payer: Self-pay | Admitting: Physical Medicine and Rehabilitation

## 2021-01-18 ENCOUNTER — Other Ambulatory Visit: Payer: Self-pay

## 2021-01-18 MED ORDER — TIZANIDINE HCL 4 MG PO TABS
ORAL_TABLET | ORAL | 1 refills | Status: DC
  Filled 2021-01-18: qty 90, 30d supply, fill #0

## 2021-01-19 ENCOUNTER — Encounter: Payer: Self-pay | Admitting: Physical Therapy

## 2021-01-19 ENCOUNTER — Other Ambulatory Visit: Payer: Self-pay

## 2021-01-19 ENCOUNTER — Ambulatory Visit
Payer: No Typology Code available for payment source | Attending: Physical Medicine and Rehabilitation | Admitting: Physical Therapy

## 2021-01-19 DIAGNOSIS — R262 Difficulty in walking, not elsewhere classified: Secondary | ICD-10-CM | POA: Insufficient documentation

## 2021-01-19 DIAGNOSIS — R278 Other lack of coordination: Secondary | ICD-10-CM | POA: Insufficient documentation

## 2021-01-19 DIAGNOSIS — M542 Cervicalgia: Secondary | ICD-10-CM | POA: Diagnosis present

## 2021-01-19 DIAGNOSIS — M6281 Muscle weakness (generalized): Secondary | ICD-10-CM | POA: Diagnosis not present

## 2021-01-19 DIAGNOSIS — R2689 Other abnormalities of gait and mobility: Secondary | ICD-10-CM | POA: Diagnosis present

## 2021-01-19 DIAGNOSIS — R293 Abnormal posture: Secondary | ICD-10-CM | POA: Insufficient documentation

## 2021-01-19 NOTE — Patient Instructions (Signed)
With [employer] on her back: -hold head in both palms and place finger tips at base of skull (two nodules) and then gently lift up and pull out until stretch is felt at base of head  Hold for 30 sec, relax for 15 sec, repeat 5 min  Then rotate hands and go in to the side holding neck in crook of neck using sides of pointer finger and hands, roll fingers in circular pattern pulling neck up and out following along crook of neck   Rotate hands back to base of head and with fingers at base of head, strum along base of head like strumming a guitar- gently;   With [place] sitting up; Start with gentle massage along right shoulder  Progress to deeper massage: "C" pattern along upper trapezius and levator scapulae muscle  You can do circular motion   Or do cross friction (perpendicular to muscle belly)

## 2021-01-19 NOTE — Therapy (Signed)
[place] MAIN [facility] SERVICES [address] [place], [place], [address] Phone: [phone]   Fax:  [phone]  Physical Therapy Treatment  Patient Details  Name: Kathryn Coffey MRN: [id number] Date of Birth: [birth year] Referring Provider (PT): shah   Encounter Date: 01/19/2021   PT End of Session - 01/19/21 1323    Visit Number 9    Number of Visits 15    Date for PT Re-Evaluation 02/23/21    Authorization Time Period Eval 11/22/20 for LE weakness; Re-eval 12/29/20    PT Start Time 1147    PT Stop Time 1230    PT Time Calculation (min) 43 min    Activity Tolerance Patient tolerated treatment well;No increased pain    Behavior During Therapy WFL for tasks assessed/performed           Past Medical History:  Diagnosis Date  . Abdominal wall cellulitis 2013 and 2014  . Asthma    allergic to grasses  . B12 deficiency   . Complication of anesthesia   . Costochondritis   . Diabetes mellitus without complication ([place])   . Gastric anomaly    multiple small ulcers  . GERD (gastroesophageal reflux disease)   . Herpes 06/2018   POSSIBLY IN EYE- PT TAKING VALTREX AND WILL SEE OPTHAMOLOGIST TO SEE IF VALTREX IS WORKING  . History of abnormal cervical Pap smear   . History of Clostridium difficile colitis   . History of hiatal hernia   . History of kidney stones   . Hypertension   . IBS (irritable bowel syndrome)   . Migraine   . Morbid obesity ([place])    s/p attempted gastric banding now decompressed  . PCOS (polycystic ovarian syndrome)   . PONV (postoperative nausea and vomiting)    WITH SPINAL ONLY  . Stroke [facility])     Past Surgical History:  Procedure Laterality Date  . BREAST EXCISIONAL BIOPSY Left    cyst excision - Dr. Patty Sermons  . CESAREAN SECTION    . CHOLECYSTECTOMY    . COLONOSCOPY    . EAR CYST EXCISION Right 07/04/2018   Procedure: EXCISION GLOMUS TUMOR THUMBNAIL;  Surgeon: Corky Mull, MD;  Location: [facility] ORS;   Service: Orthopedics;  Laterality: Right;  . ESOPHAGOGASTRODUODENOSCOPY    . ESOPHAGOGASTRODUODENOSCOPY (EGD) WITH PROPOFOL N/A 12/06/2015   Procedure: ESOPHAGOGASTRODUODENOSCOPY (EGD) WITH PROPOFOL;  Surgeon: Manya Silvas, MD;  Location: [facility] END[facility]COPY;  Service: Endoscopy;  Laterality: N/A;  . IR ANGIO INTRA EXTRACRAN SEL INTERNAL CAROTID UNI L MOD SED  07/15/2019  . IR ANGIO VERTEBRAL SEL SUBCLAVIAN INNOMINATE UNI R MOD SED  07/15/2019  . IR CT HEAD LTD  07/15/2019  . IR INTRAVSC STENT CERV CAROTID W/O EMB-PROT MOD SED INC ANGIO  07/15/2019  . IR PERCUTANEOUS ART THROMBECTOMY/INFUSION INTRACRANIAL INC DIAG ANGIO  07/15/2019  . LAPAROSCOPIC GASTRIC BANDING    . LAPAROSCOPIC GASTRIC RESTRICTIVE DUODENAL PROCEDURE (DUODENAL SWITCH) N/A 01/25/2016   Procedure: LAPAROSCOPIC GASTRIC RESTRICTIVE DUODENAL PROCEDURE (DUODENAL SWITCH);  Surgeon: Ladora Daniel, MD;  Location: [facility] ORS;  Service: General;  Laterality: N/A;  . OVARY SURGERY     x2  . RADIOLOGY WITH ANESTHESIA N/A 07/15/2019   Procedure: IR WITH ANESTHESIA;  Surgeon: Luanne Bras, MD;  Location: [place];  Service: Radiology;  Laterality: N/A;  . TONSILLECTOMY    . TUBAL LIGATION    . UMBILICAL HERNIA REPAIR N/A 01/25/2016   Procedure: LAPAROSCOPIC UMBILICAL HERNIA;  Surgeon: Ladora Daniel, MD;  Location: [facility] ORS;  Service: General;  Laterality: N/A;  . UMBILICAL HERNIA REPAIR N/A 10/20/2016   Procedure: HERNIA REPAIR UMBILICAL ADULT;  Surgeon: Leonie Green, MD;  Location: [facility] ORS;  Service: General;  Laterality: N/A;    There were no vitals filed for this visit.   Subjective Assessment - 01/19/21 1154    Subjective Patient reports continued RUE discomfort. She did get steriod injection last week with good results. She reports some numbness in right hand but states that it is better. She also reports less pain down RUE;    Patient is accompained by: Family member   husband, Ray   Pertinent History 50 year old female  with history of CVA on 07/15/2019 with left sided weakness. Patient has PMH significant for DM, GERD, HTN, Asthma, Herpes, Costochondritis, Vit B12 deficiency. Patient did get repeat MRI with Dr. Manuella Ghazi with consistent bulging disk at C5-C6 with right side radiculopathy;    Limitations Lifting;Standing;Walking    How long can you sit comfortably? no restrictions    How long can you stand comfortably? Limited with prolonged standing - around 30 min    How long can you walk comfortably? around 30 min    Patient Stated Goals I want to improve my strength and increase my stability with standing/walking    Currently in Pain? Yes    Pain Score 2     Pain Location Arm    Pain Orientation Right    Pain Descriptors / Indicators Aching;Sore    Pain Type Chronic pain    Pain Onset Other (comment)   Chronic since 06/2019   Pain Frequency Intermittent    Aggravating Factors  prolonged use of hands/UE    Pain Relieving Factors neck massager    Effect of Pain on Daily Activities decreased ADL tolerance;    Multiple Pain Sites No             TREATMENT:  Patient supine with moist heat to cervical spine  PT instructed caregiver in various manual techniques to help alleviate symptoms and improve tissue extensibility:  PT performed suboccipital release 30 sec hold, 15 sec rest x5 min PT educated patient/caregiver in gentle cervical extension/distraction with milking/rolling technique PT educated patient/caregiver in suboccipital release with gentle strumming to improve tissue extensibility and reduce tightness  Caregiver required cues for proper hand placement and technique; Patient reports reduction of pain/numbness with manual techniques  Patient transitioned to sitting  Educated patient/caregiver in various soft tissue massage to help reduce tightness along levator scapule and upper trap. Started with soft tissue massage and progressed to deep tissue massage utilizing "C" technique, myofascial  release and cross friction  Patient tolerated well reporting no numbness/tingling and less stiffness at end of session;  See patient instructions  Patient would benefit from additional skilled PT Intervention to improve tissue extensibility and reduce pain with ADLs.                         PT Education - 01/19/21 1323    Education Details manual therapy techniques    Person(s) Educated Patient;Spouse    Methods Explanation;Verbal cues;Demonstration    Comprehension Verbalized understanding;Returned demonstration;Verbal cues required            PT Short Term Goals - 12/29/20 1209      PT SHORT TERM GOAL #1   Title Pt will be independent with HEP in order to improve strength and balance in order to decrease fall risk  and improve function at home and work.    Baseline 11/22/2020- Patient has no formal HEP in place.    Time 4    Period Weeks    Status New    Target Date 01/26/21      PT SHORT TERM GOAL #2   Title Patient will increase cervical AROM rotation >60 degrees bilaterally for increased functional ROM for turning head when driving;    Time 4    Period Weeks    Status New    Target Date 01/26/21             PT Long Term Goals - 12/29/20 1210      PT LONG TERM GOAL #1   Title Pt will improve DGI by at least 3 points in order to demonstrate clinically significant improvement in balance and decreased risk for falls.    Baseline 11/22/2020= DGI score= 15/24    Time 8    Period Weeks    Status New    Target Date 02/23/21      PT LONG TERM GOAL #2   Title Pt will report reduction in numbness in right hand by 50% to improve tolerance with ADLs and increase safety within home    Baseline 5/11- numbness in RUE hand is constant    Time 8    Period Weeks    Status New    Target Date 02/23/21      PT LONG TERM GOAL #3   Title Pt will decrease TUG to below 11 seconds/decrease in order to demonstrate decreased fall risk.    Baseline 11/22/2020= 13.34  sec without an AD    Time 8    Period Weeks    Status New    Target Date 02/23/21      PT LONG TERM GOAL #4   Title Patient will increase FOTO score to equal to or greater than 66  to demonstrate statistically significant improvement in mobility and quality of life.    Baseline 11/22/2020= FOTO= 62    Time 8    Period Weeks    Status New    Target Date 02/23/21      PT LONG TERM GOAL #5   Title Patient will report a worst pain of 3/10 on VAS (neck/RUE)  to improve tolerance with ADLs and reduced symptoms with activities.    Baseline 5/11- reports variable pain in neck/RUE    Time 8    Period Weeks    Status New    Target Date 02/23/21                 Plan - 01/19/21 1324    Clinical Impression Statement Patient presents to therapy with her husband. PT educated spouse in various manual techniques to alleviate tightness in cervical paraspinals and reduce symptoms down RUE. Patient and spouse required min-mod verbal cues and tactile cues for proper technique. Patient and spouse verbalize and demonstrated understanding. Patient reports resolution of symptoms at end of session. She responds well to manual therapy with improved tissue extensibility and reduced numbness down RUE. Patient would benefit from additional skilled PT Intervention to improve cervical ROM and reduce pain down UE for improved mobility;    Personal Factors and Comorbidities Comorbidity 2    Comorbidities Diabetes, CVA    Examination-Activity Limitations Carry;Lift;Transfers    Examination-Participation Restrictions Occupation;Cleaning;Community Activity;Shop;Yard Work    Stability/Clinical Decision Making Stable/Uncomplicated    Rehab Potential Good    PT Frequency 1x / week  PT Duration 8 weeks    PT Treatment/Interventions ADLs/Self Care Home Management;Biofeedback;Electrical Stimulation;Moist Heat;DME Instruction;Gait training;Stair training;Functional mobility training;Therapeutic exercise;Balance  training;Neuromuscular re-education;Therapeutic activities;Patient/family education;Manual techniques;Passive range of motion;Dry needling    PT Next Visit Plan continue with walkaide, work on cervical ROM/postural strengthening    PT Home Exercise Plan To be initiated next 1-2 visits.    Consulted and Agree with Plan of Care Patient           Patient will benefit from skilled therapeutic intervention in order to improve the following deficits and impairments:  Abnormal gait,Decreased activity tolerance,Decreased coordination,Decreased endurance,Decreased mobility,Decreased strength,Impaired tone,Impaired UE functional use,Pain,Hypomobility,Decreased range of motion,Postural dysfunction  Visit Diagnosis: Muscle weakness (generalized)  Other lack of coordination  Difficulty in walking, not elsewhere classified  Other abnormalities of gait and mobility  Cervicalgia  Abnormal posture     Problem List Patient Active Problem List   Diagnosis Date Noted  . E. coli UTI   . Diabetes mellitus type 2 in obese ([place])   . Chronic pain of right knee   . 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 Hemiparesis affecting left side as late effect of stroke ([place])   . Acute ischemic right middle cerebral artery (MCA) stroke ([place]) 07/22/2019  . Carotid artery dissection  (HCC) s/p stent placement 07/21/2019  . Diabetes mellitus type II, uncontrolled ([place]) 07/21/2019  . Acute blood loss anemia 07/21/2019  . Leukocytosis 07/21/2019  . Stroke (cerebrum) ([place]) - R MCA infarct s/p tenecteplase and mechanical thrombectomy w/ d/t ICA dissection  07/15/2019  . Middle cerebral artery embolism, right 07/15/2019  . Controlled type 2 diabetes mellitus without complication, without long-term current use of insulin ([place]) 05/28/2017  . Attention deficit hyperactivity disorder (ADHD), combined type 04/18/2017  . Morbid obesity ([place]) 01/25/2016  . Umbilical hernia 83/41/9622  . Hiatal hernia 12/08/2015   . History of Clostridium difficile colitis 06/19/2014  . HTN (hypertension) 12/23/2013  . PCOS (polycystic ovarian syndrome) 12/23/2013  . Bariatric surgery status 01/28/2013    Genavive Kubicki PT, DPT 01/19/2021, 1:26 PM  [place] MAIN [facility] SERVICES [address] [place], [place], [address] Phone: [phone]   Fax:  [phone]  Name: ARIANNIE PENALOZA MRN: [id number] Date of Birth: [birth year]

## 2021-01-25 ENCOUNTER — Ambulatory Visit: Payer: No Typology Code available for payment source | Admitting: Occupational Therapy

## 2021-01-25 ENCOUNTER — Other Ambulatory Visit: Payer: Self-pay

## 2021-01-25 ENCOUNTER — Ambulatory Visit: Payer: No Typology Code available for payment source | Admitting: Physical Therapy

## 2021-01-25 ENCOUNTER — Encounter: Payer: Self-pay | Admitting: Physical Therapy

## 2021-01-25 DIAGNOSIS — R2689 Other abnormalities of gait and mobility: Secondary | ICD-10-CM

## 2021-01-25 DIAGNOSIS — M6281 Muscle weakness (generalized): Secondary | ICD-10-CM

## 2021-01-25 DIAGNOSIS — R262 Difficulty in walking, not elsewhere classified: Secondary | ICD-10-CM

## 2021-01-25 DIAGNOSIS — R278 Other lack of coordination: Secondary | ICD-10-CM

## 2021-01-25 DIAGNOSIS — R293 Abnormal posture: Secondary | ICD-10-CM

## 2021-01-25 DIAGNOSIS — M542 Cervicalgia: Secondary | ICD-10-CM

## 2021-01-25 NOTE — Therapy (Signed)
[place] [facility] SERVICES [address] [place], [place], [address] Phone: [phone]   Fax:  [phone]  Physical Therapy Treatment Physical Therapy Progress Note   Dates of reporting period  12/29/20   to   01/25/21   Patient Details  Name: Kathryn Coffey MRN: [id number] Date of Birth: [birth year] Referring Provider (PT): shah   Encounter Date: 01/25/2021   PT End of Session - 01/25/21 0017    Visit Number 10    Number of Visits 15    Date for PT Re-Evaluation 02/23/21    Authorization Time Period Eval 11/22/20 for LE weakness; Re-eval 12/29/20    PT Start Time 1345    PT Stop Time 1430    PT Time Calculation (min) 45 min    Activity Tolerance Patient tolerated treatment well;No increased pain    Behavior During Therapy WFL for tasks assessed/performed           Past Medical History:  Diagnosis Date  . Abdominal wall cellulitis 2013 and 2014  . Asthma    allergic to grasses  . B12 deficiency   . Complication of anesthesia   . Costochondritis   . Diabetes mellitus without complication ([place])   . Gastric anomaly    multiple small ulcers  . GERD (gastroesophageal reflux disease)   . Herpes 06/2018   POSSIBLY IN EYE- PT TAKING VALTREX AND WILL SEE OPTHAMOLOGIST TO SEE IF VALTREX IS WORKING  . History of abnormal cervical Pap smear   . History of Clostridium difficile colitis   . History of hiatal hernia   . History of kidney stones   . Hypertension   . IBS (irritable bowel syndrome)   . Migraine   . Morbid obesity ([place])    s/p attempted gastric banding now decompressed  . PCOS (polycystic ovarian syndrome)   . PONV (postoperative nausea and vomiting)    WITH SPINAL ONLY  . Stroke [facility])     Past Surgical History:  Procedure Laterality Date  . BREAST EXCISIONAL BIOPSY Left    cyst excision - Dr. Patty Sermons  . CESAREAN SECTION    . CHOLECYSTECTOMY    . COLONOSCOPY    . EAR CYST EXCISION Right 07/04/2018   Procedure:  EXCISION GLOMUS TUMOR THUMBNAIL;  Surgeon: Corky Mull, MD;  Location: [facility] ORS;  Service: Orthopedics;  Laterality: Right;  . ESOPHAGOGASTRODUODENOSCOPY    . ESOPHAGOGASTRODUODENOSCOPY (EGD) WITH PROPOFOL N/A 12/06/2015   Procedure: ESOPHAGOGASTRODUODENOSCOPY (EGD) WITH PROPOFOL;  Surgeon: Manya Silvas, MD;  Location: [facility] ENDOSCOPY;  Service: Endoscopy;  Laterality: N/A;  . IR ANGIO INTRA EXTRACRAN SEL INTERNAL CAROTID UNI L MOD SED  07/15/2019  . IR ANGIO VERTEBRAL SEL SUBCLAVIAN INNOMINATE UNI R MOD SED  07/15/2019  . IR CT HEAD LTD  07/15/2019  . IR INTRAVSC STENT CERV CAROTID W/O EMB-PROT MOD SED INC ANGIO  07/15/2019  . IR PERCUTANEOUS ART THROMBECTOMY/INFUSION INTRACRANIAL INC DIAG ANGIO  07/15/2019  . LAPAROSCOPIC GASTRIC BANDING    . LAPAROSCOPIC GASTRIC RESTRICTIVE DUODENAL PROCEDURE (DUODENAL SWITCH) N/A 01/25/2016   Procedure: LAPAROSCOPIC GASTRIC RESTRICTIVE DUODENAL PROCEDURE (DUODENAL SWITCH);  Surgeon: Ladora Daniel, MD;  Location: [facility] ORS;  Service: General;  Laterality: N/A;  . OVARY SURGERY     x2  . RADIOLOGY WITH ANESTHESIA N/A 07/15/2019   Procedure: IR WITH ANESTHESIA;  Surgeon: Luanne Bras, MD;  Location: [place];  Service: Radiology;  Laterality: N/A;  . TONSILLECTOMY    . TUBAL LIGATION    .  UMBILICAL HERNIA REPAIR N/A 01/25/2016   Procedure: LAPAROSCOPIC UMBILICAL HERNIA;  Surgeon: Ladora Daniel, MD;  Location: [facility] ORS;  Service: General;  Laterality: N/A;  . UMBILICAL HERNIA REPAIR N/A 10/20/2016   Procedure: HERNIA REPAIR UMBILICAL ADULT;  Surgeon: Leonie Green, MD;  Location: [facility] ORS;  Service: General;  Laterality: N/A;    There were no vitals filed for this visit.   Subjective Assessment - 01/25/21 1352    Subjective Patient reports her neck/shoulder has been better up until last 2 days when she adjusted her work station and that has made it tighter.    Patient is accompained by: Family member   husband, Kathryn Coffey   Pertinent History 50 year  old female with history of CVA on 07/15/2019 with left sided weakness. Patient has PMH significant for DM, GERD, HTN, Asthma, Herpes, Costochondritis, Vit B12 deficiency. Patient did get repeat MRI with Dr. Manuella Ghazi with consistent bulging disk at C5-C6 with right side radiculopathy;    Limitations Lifting;Standing;Walking    How long can you sit comfortably? no restrictions    How long can you stand comfortably? Limited with prolonged standing - around 30 min    How long can you walk comfortably? around 30 min    Patient Stated Goals I want to improve my strength and increase my stability with standing/walking    Currently in Pain? Yes    Pain Score 3     Pain Location Arm    Pain Orientation Right    Pain Descriptors / Indicators Aching;Sore    Pain Type Chronic pain    Pain Onset Other (comment)   Chronic since 06/2019   Pain Frequency Intermittent    Aggravating Factors  prolonged use of hands/UE    Pain Relieving Factors neck massager    Effect of Pain on Daily Activities decreased ADL tolerance;    Multiple Pain Sites No           TREATMENT:  Patient prone with moist heat to cervical/thoracic paraspinals x5 min (Unbilled);  PT performed soft tissue massage along upper thoracic paraspinals including upper/middle trap PT performed grade II-III PA mobs at T1-T8, 15 sec bouts x2 sets each with mild tenderness reported. She does exhibit significant stiffness at T6-T8  Instructed patient in postural strengthening: RUE: shoulder extension x15 reps; Shoulder low rows x15 reps Shoulder mid row x10 reps Patient required min-moderate verbal/tactile cues for correct exercise technique. Patient has difficulty doing exercise with LUE as it is hemiparetic s/p CVA with decreased motor control and weakness;  Patient required close supervision transitioning supine:  PT performed manual therapy to reduce tightness/stiffness:  PT performed suboccipital release 30 sec hold, 10 sec rest x5 min  with good tolerance; PT performed passive cervical stretches: -upper trap stretch 30 sec hold x2 reps bilaterally; -passive cervical rotation x5 reps each direction  PT performed gentle cervical distraction 30 sec hold, 15 sec rest x10 min with reports of less numbness/tingling in RUE hand with distraction;  PT performed gentle cervical distraction with central PA mobs grade 1 to facilitate better mobility and reduce discomfort x2-3 min;  Finished with suboccipital release with instructing patient in chin tuck 5 sec hold x5 reps;  Patient tolerated session well. She reports no pain down RUE and minimal numbness. She also reports no tightness in right UE. She does state that while symptoms have resolved after therapy session she reports it only lasts for short duration as symptoms quickly return during the day.   Patient's  condition has the potential to improve in response to therapy. Maximum improvement is yet to be obtained. The anticipated improvement is attainable and reasonable in a generally predictable time.  Patient reports adherence with HEP. She would benefit from additional manual therapy and postural strengthening;                            PT Education - 01/25/21 1441    Education Details manual therapy techniques/posture    Person(s) Educated Patient    Methods Explanation;Verbal cues    Comprehension Verbalized understanding;Returned demonstration;Verbal cues required;Need further instruction            PT Short Term Goals - 01/25/21 1444      PT SHORT TERM GOAL #1   Title Pt will be independent with HEP in order to improve strength and balance in order to decrease fall risk and improve function at home and work.    Baseline 11/22/2020- Patient has no formal HEP in place.    Time 4    Period Weeks    Status On-going    Target Date 01/26/21      PT SHORT TERM GOAL #2   Title Patient will increase cervical AROM rotation >60 degrees  bilaterally for increased functional ROM for turning head when driving;    Time 4    Period Weeks    Status On-going    Target Date 01/26/21             PT Long Term Goals - 01/25/21 1444      PT LONG TERM GOAL #1   Title Pt will improve DGI by at least 3 points in order to demonstrate clinically significant improvement in balance and decreased risk for falls.    Baseline 11/22/2020= DGI score= 15/24    Time 8    Period Weeks    Status On-going    Target Date 02/23/21      PT LONG TERM GOAL #2   Title Pt will report reduction in numbness in right hand by 50% to improve tolerance with ADLs and increase safety within home    Baseline 5/11- numbness in RUE hand is constant    Time 8    Period Weeks    Status On-going    Target Date 02/23/21      PT LONG TERM GOAL #3   Title Pt will decrease TUG to below 11 seconds/decrease in order to demonstrate decreased fall risk.    Baseline 11/22/2020= 13.34 sec without an AD    Time 8    Period Weeks    Status On-going    Target Date 02/23/21      PT LONG TERM GOAL #4   Title Patient will increase FOTO score to equal to or greater than 66  to demonstrate statistically significant improvement in mobility and quality of life.    Baseline 11/22/2020= FOTO= 62    Time 8    Period Weeks    Status On-going    Target Date 02/23/21      PT LONG TERM GOAL #5   Title Patient will report a worst pain of 3/10 on VAS (neck/RUE)  to improve tolerance with ADLs and reduced symptoms with activities.    Baseline 5/11- reports variable pain in neck/RUE    Time 8    Period Weeks    Status Achieved    Target Date 02/23/21  Plan - 01/25/21 1442    Clinical Impression Statement Patient presents to therapy with continued RUE discomfort. She reports good tolerance of manual therapy with resolution of RUE hand numbness and less pain down RUE. However she states that relief is short in duration. Educated patient in home cervical  traction units. Recommend patient talk with PCP to see if he feels that would be appropriate given carotid dissection and history of stroke. Patient will also reach out to Dr. Manuella Ghazi. Patient would benefit from additional skilled PT intervention to improve postural strength and reduce pain with ADLs.    Personal Factors and Comorbidities Comorbidity 2    Comorbidities Diabetes, CVA    Examination-Activity Limitations Carry;Lift;Transfers    Examination-Participation Restrictions Occupation;Cleaning;Community Activity;Shop;Yard Work    Stability/Clinical Decision Making Stable/Uncomplicated    Rehab Potential Good    PT Frequency 1x / week    PT Duration 8 weeks    PT Treatment/Interventions ADLs/Self Care Home Management;Biofeedback;Electrical Stimulation;Moist Heat;DME Instruction;Gait training;Stair training;Functional mobility training;Therapeutic exercise;Balance training;Neuromuscular re-education;Therapeutic activities;Patient/family education;Manual techniques;Passive range of motion;Dry needling    PT Next Visit Plan continue with walkaide, work on cervical ROM/postural strengthening    PT Home Exercise Plan To be initiated next 1-2 visits.    Consulted and Agree with Plan of Care Patient           Patient will benefit from skilled therapeutic intervention in order to improve the following deficits and impairments:  Abnormal gait,Decreased activity tolerance,Decreased coordination,Decreased endurance,Decreased mobility,Decreased strength,Impaired tone,Impaired UE functional use,Pain,Hypomobility,Decreased range of motion,Postural dysfunction  Visit Diagnosis: Muscle weakness (generalized)  Other lack of coordination  Difficulty in walking, not elsewhere classified  Other abnormalities of gait and mobility  Cervicalgia  Abnormal posture     Problem List Patient Active Problem List   Diagnosis Date Noted  . E. coli UTI   . Diabetes mellitus type 2 in obese ([place])   .  Chronic pain of right knee   . 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 Hemiparesis affecting left side as late effect of stroke ([place])   . Acute ischemic right middle cerebral artery (MCA) stroke ([place]) 07/22/2019  . Carotid artery dissection  (H[facility]) s/p stent placement 07/21/2019  . Diabetes mellitus type II, uncontrolled ([place]) 07/21/2019  . Acute blood loss anemia 07/21/2019  . Leukocytosis 07/21/2019  . Stroke (cerebrum) ([place]) - R MCA infarct s/p tenecteplase and mechanical thrombectomy w/ d/t ICA dissection  07/15/2019  . Middle cerebral artery embolism, right 07/15/2019  . Controlled type 2 diabetes mellitus without complication, without long-term current use of insulin ([place]) 05/28/2017  . Attention deficit hyperactivity disorder (ADHD), combined type 04/18/2017  . Morbid obesity ([place]) 01/25/2016  . Umbilical hernia 54/56/2563  . Hiatal hernia 12/08/2015  . History of Clostridium difficile colitis 06/19/2014  . HTN (hypertension) 12/23/2013  . PCOS (polycystic ovarian syndrome) 12/23/2013  . Bariatric surgery status 01/28/2013    Naela Nodal PT, DPT 01/25/2021, 2:48 PM  [place] [facility] SERVICES [address] [place], [place], [address] Phone: [phone]   Fax:  [phone]  Name: Kathryn Coffey MRN: [id number] Date of Birth: [birth year]

## 2021-01-26 ENCOUNTER — Other Ambulatory Visit: Payer: Self-pay

## 2021-01-26 MED ORDER — VYVANSE 50 MG PO CAPS
ORAL_CAPSULE | ORAL | 0 refills | Status: DC
  Filled 2021-01-26 – 2021-02-02 (×2): qty 30, 30d supply, fill #0

## 2021-01-27 ENCOUNTER — Ambulatory Visit: Payer: No Typology Code available for payment source | Admitting: Physical Therapy

## 2021-02-01 ENCOUNTER — Other Ambulatory Visit: Payer: Self-pay

## 2021-02-01 ENCOUNTER — Ambulatory Visit: Payer: No Typology Code available for payment source | Admitting: Physical Therapy

## 2021-02-01 ENCOUNTER — Ambulatory Visit: Payer: No Typology Code available for payment source

## 2021-02-01 ENCOUNTER — Encounter: Payer: Self-pay | Admitting: Physical Therapy

## 2021-02-01 DIAGNOSIS — R262 Difficulty in walking, not elsewhere classified: Secondary | ICD-10-CM

## 2021-02-01 DIAGNOSIS — R293 Abnormal posture: Secondary | ICD-10-CM

## 2021-02-01 DIAGNOSIS — R278 Other lack of coordination: Secondary | ICD-10-CM

## 2021-02-01 DIAGNOSIS — R2689 Other abnormalities of gait and mobility: Secondary | ICD-10-CM

## 2021-02-01 DIAGNOSIS — M542 Cervicalgia: Secondary | ICD-10-CM

## 2021-02-01 DIAGNOSIS — M6281 Muscle weakness (generalized): Secondary | ICD-10-CM | POA: Diagnosis not present

## 2021-02-01 MED ORDER — TRIAMTERENE-HCTZ 37.5-25 MG PO CAPS
1.0000 | ORAL_CAPSULE | Freq: Every morning | ORAL | 11 refills | Status: DC
  Filled 2021-02-01: qty 30, 30d supply, fill #0
  Filled 2021-10-20: qty 90, 90d supply, fill #0

## 2021-02-02 ENCOUNTER — Other Ambulatory Visit: Payer: Self-pay

## 2021-02-02 NOTE — Therapy (Signed)
[place] MAIN [facility] SERVICES [address] [place], [place], [address] Phone: [phone]   Fax:  [phone]  Physical Therapy Treatment  Patient Details  Name: Kathryn Coffey MRN: [id number] Date of Birth: 1971/03/01 Referring Provider (PT): shah   Encounter Date: 02/01/2021   PT End of Session - 02/02/21 0806     Visit Number 11    Number of Visits 15    Date for PT Re-Evaluation 02/23/21    Authorization Time Period Eval 11/22/20 for LE weakness; Re-eval 12/29/20    PT Start Time 1102    PT Stop Time 1146    PT Time Calculation (min) 44 min    Activity Tolerance Patient tolerated treatment well;No increased pain    Behavior During Therapy [facility] for tasks assessed/performed             Past Medical History:  Diagnosis Date   Abdominal wall cellulitis 2013 and 2014   Asthma    allergic to grasses   B12 deficiency    Complication of anesthesia    Costochondritis    Diabetes mellitus without complication (HCC)    Gastric anomaly    multiple small ulcers   GERD (gastroesophageal reflux disease)    Herpes 06/2018   POSSIBLY IN EYE- PT TAKING VALTREX AND WILL SEE OPTHAMOLOGIST TO SEE IF VALTREX IS WORKING   History of abnormal cervical Pap smear    History of Clostridium difficile colitis    History of hiatal hernia    History of kidney stones    Hypertension    IBS (irritable bowel syndrome)    Migraine    Morbid obesity ([place])    s/p attempted gastric banding now decompressed   PCOS (polycystic ovarian syndrome)    PONV (postoperative nausea and vomiting)    WITH SPINAL ONLY   Stroke [facility])     Past Surgical History:  Procedure Laterality Date   BREAST EXCISIONAL BIOPSY Left    cyst excision - Dr. Patty Sermons   CESAREAN SECTION     CHOLECYSTECTOMY     COLONOSCOPY     EAR CYST EXCISION Right 07/04/2018   Procedure: EXCISION GLOMUS TUMOR THUMBNAIL;  Surgeon: Corky Mull, MD;  Location: [facility] ORS;  Service: Orthopedics;   Laterality: Right;   ESOPHAGOGASTRODUODENOSCOPY     ESOPHAGOGASTRODUODENOSCOPY (EGD) WITH PROPOFOL N/A 12/06/2015   Procedure: ESOPHAGOGASTRODUODENOSCOPY (EGD) WITH PROPOFOL;  Surgeon: Manya Silvas, MD;  Location: [facility] ENDOSCOPY;  Service: Endoscopy;  Laterality: N/A;   IR ANGIO INTRA EXTRACRAN SEL INTERNAL CAROTID UNI L MOD SED  07/15/2019   IR ANGIO VERTEBRAL SEL SUBCLAVIAN INNOMINATE UNI R MOD SED  07/15/2019   IR CT HEAD LTD  07/15/2019   IR INTRAVSC STENT CERV CAROTID W/O EMB-PROT MOD SED INC ANGIO  07/15/2019   IR PERCUTANEOUS ART THROMBECTOMY/INFUSION INTRACRANIAL INC DIAG ANGIO  07/15/2019   LAPAROSCOPIC GASTRIC BANDING     LAPAROSCOPIC GASTRIC RESTRICTIVE DUODENAL PROCEDURE (DUODENAL SWITCH) N/A 01/25/2016   Procedure: LAPAROSCOPIC GASTRIC RESTRICTIVE DUODENAL PROCEDURE (DUODENAL SWITCH);  Surgeon: Ladora Daniel, MD;  Location: [facility] ORS;  Service: General;  Laterality: N/A;   OVARY SURGERY     x2   RADIOLOGY WITH ANESTHESIA N/A 07/15/2019   Procedure: IR WITH ANESTHESIA;  Surgeon: Luanne Bras, MD;  Location: [place];  Service: Radiology;  Laterality: N/A;   TONSILLECTOMY     TUBAL LIGATION     UMBILICAL HERNIA REPAIR N/A 01/25/2016   Procedure: LAPAROSCOPIC UMBILICAL HERNIA;  Surgeon: Legrand Como  Jed Limerick, MD;  Location: [facility] ORS;  Service: General;  Laterality: N/A;   UMBILICAL HERNIA REPAIR N/A 10/20/2016   Procedure: HERNIA REPAIR UMBILICAL ADULT;  Surgeon: Leonie Green, MD;  Location: [facility] ORS;  Service: General;  Laterality: N/A;    There were no vitals filed for this visit.   Subjective Assessment - 02/01/21 1107     Subjective Patient reports her neck and shoulder pain has improved. She is still having some tightness but not having the shooting pain down the RUE. She is still having numbness down RUE into hand. She is going to get referral for NCV test;    Patient is accompained by: Family member   husband, Ray   Pertinent History 50 year old female with history  of CVA on 07/15/2019 with left sided weakness. Patient has PMH significant for DM, GERD, HTN, Asthma, Herpes, Costochondritis, Vit B12 deficiency. Patient did get repeat MRI with Dr. Manuella Ghazi with consistent bulging disk at C5-C6 with right side radiculopathy;    Limitations Lifting;Standing;Walking    How long can you sit comfortably? no restrictions    How long can you stand comfortably? Limited with prolonged standing - around 30 min    How long can you walk comfortably? around 30 min    Patient Stated Goals I want to improve my strength and increase my stability with standing/walking    Currently in Pain? No/denies    Pain Onset Other (comment)   Chronic since 06/2019   Multiple Pain Sites No              TREATMENT: Patient reports continued numbness in RUE hand; reports less pain in RUE shoulder. Reports her husband has been helped her with some of the massage techniques we reviewed a few sessions ago.  PT instructed patient in left sidelying: PT inspected RUE scapular paraspinals with increased tightness noted.  PT performed myofascial release to right scapular paraspinals x10 min; Patient initially reported mild to moderate tenderness with minimal movement of scapular noted. However with increased manual therapy, increased extensibility of scapular observed with less symptoms down RUE  Patient instructed in rolling supine:   PT performed manual therapy to reduce tightness/stiffness:   PT performed suboccipital release 30 sec hold, 10 sec rest x5 min with good tolerance; PT performed passive cervical stretches: -upper trap stretch 30 sec hold x2 reps bilaterally; -passive cervical rotation x5 reps each direction   PT performed gentle cervical distraction 30 sec hold, 15 sec rest x10 min with reports of less numbness/tingling in RUE hand with distraction;   PT performed gentle cervical distraction with central PA mobs grade 1 to facilitate better mobility and reduce discomfort  x2-3 min;   Finished with suboccipital release x1 min with improved head positioning and less forward head noted;    Patient tolerated session well. She reports no pain down RUE and no numbness. She also reports no tightness in right UE. She does state that while symptoms have resolved after therapy session she reports it only lasts for short duration as symptoms quickly return during the day.                         PT Education - 02/02/21 0806     Education Details manual therapy, postural re-education;    Person(s) Educated Patient    Methods Explanation;Verbal cues    Comprehension Verbalized understanding;Returned demonstration;Verbal cues required;Need further instruction  PT Short Term Goals - 01/25/21 1444       PT SHORT TERM GOAL #1   Title Pt will be independent with HEP in order to improve strength and balance in order to decrease fall risk and improve function at home and work.    Baseline 11/22/2020- Patient has no formal HEP in place.    Time 4    Period Weeks    Status On-going    Target Date 01/26/21      PT SHORT TERM GOAL #2   Title Patient will increase cervical AROM rotation >60 degrees bilaterally for increased functional ROM for turning head when driving;    Time 4    Period Weeks    Status On-going    Target Date 01/26/21               PT Long Term Goals - 01/25/21 1444       PT LONG TERM GOAL #1   Title Pt will improve DGI by at least 3 points in order to demonstrate clinically significant improvement in balance and decreased risk for falls.    Baseline 11/22/2020= DGI score= 15/24    Time 8    Period Weeks    Status On-going    Target Date 02/23/21      PT LONG TERM GOAL #2   Title Pt will report reduction in numbness in right hand by 50% to improve tolerance with ADLs and increase safety within home    Baseline 5/11- numbness in RUE hand is constant    Time 8    Period Weeks    Status On-going     Target Date 02/23/21      PT LONG TERM GOAL #3   Title Pt will decrease TUG to below 11 seconds/decrease in order to demonstrate decreased fall risk.    Baseline 11/22/2020= 13.34 sec without an AD    Time 8    Period Weeks    Status On-going    Target Date 02/23/21      PT LONG TERM GOAL #4   Title Patient will increase FOTO score to equal to or greater than 66  to demonstrate statistically significant improvement in mobility and quality of life.    Baseline 11/22/2020= FOTO= 62    Time 8    Period Weeks    Status On-going    Target Date 02/23/21      PT LONG TERM GOAL #5   Title Patient will report a worst pain of 3/10 on VAS (neck/RUE)  to improve tolerance with ADLs and reduced symptoms with activities.    Baseline 5/11- reports variable pain in neck/RUE    Time 8    Period Weeks    Status Achieved    Target Date 02/23/21                   Plan - 02/02/21 0806     Clinical Impression Statement Patient reports continued RUE numbness/tingling. She does report her pain has significantly improved. She did see her doctor who is going to refer her for NCV testing due to continued numbness in RUE hand. PT performed extensive manual therapy including RUE scapular myofascial release which helped alleviate RUE symptoms. Patient initially exhibits significant tightness in right scapuar paraspinals which improved with manual therapy techniques. PT also performed gentle cervical distraction which helped alleviate numbness/tingling down RUE. She reports no symptoms at end of session. Patient would benefit from additional skilled PT intervention to improve strength,  postural awareness and reduce numbness down UE;    Personal Factors and Comorbidities Comorbidity 2    Comorbidities Diabetes, CVA    Examination-Activity Limitations Carry;Lift;Transfers    Examination-Participation Restrictions Occupation;Cleaning;Community Activity;Shop;Yard Work    Stability/Clinical Decision Making  Stable/Uncomplicated    Rehab Potential Good    PT Frequency 1x / week    PT Duration 8 weeks    PT Treatment/Interventions ADLs/Self Care Home Management;Biofeedback;Electrical Stimulation;Moist Heat;DME Instruction;Gait training;Stair training;Functional mobility training;Therapeutic exercise;Balance training;Neuromuscular re-education;Therapeutic activities;Patient/family education;Manual techniques;Passive range of motion;Dry needling    PT Next Visit Plan continue with walkaide, work on cervical ROM/postural strengthening    PT Home Exercise Plan To be initiated next 1-2 visits.    Consulted and Agree with Plan of Care Patient             Patient will benefit from skilled therapeutic intervention in order to improve the following deficits and impairments:  Abnormal gait, Decreased activity tolerance, Decreased coordination, Decreased endurance, Decreased mobility, Decreased strength, Impaired tone, Impaired UE functional use, Pain, Hypomobility, Decreased range of motion, Postural dysfunction  Visit Diagnosis: Muscle weakness (generalized)  Other lack of coordination  Difficulty in walking, not elsewhere classified  Other abnormalities of gait and mobility  Cervicalgia  Abnormal posture     Problem List Patient Active Problem List   Diagnosis Date Noted   E. coli UTI    Diabetes mellitus type 2 in obese [facility])    Chronic pain of right knee    Flaccid monoplegia of upper extremity (HCC)    Hemiparesis affecting left side as late effect of stroke (HCC)    Acute ischemic right middle cerebral artery (MCA) stroke (HCC) 07/22/2019   Carotid artery dissection  (HCC) s/p stent placement 07/21/2019   Diabetes mellitus type II, uncontrolled ([place]) 07/21/2019   Acute blood loss anemia 07/21/2019   Leukocytosis 07/21/2019   Stroke (cerebrum) (HCC) - R MCA infarct s/p tenecteplase and mechanical thrombectomy w/ d/t ICA dissection  07/15/2019   Middle cerebral artery embolism,  right 07/15/2019   Controlled type 2 diabetes mellitus without complication, without long-term current use of insulin ([place]) 05/28/2017   Attention deficit hyperactivity disorder (ADHD), combined type 04/18/2017   Morbid obesity ([place]) 79/48/0165   Umbilical hernia 53/74/8270   Hiatal hernia 12/08/2015   History of Clostridium difficile colitis 06/19/2014   HTN (hypertension) 12/23/2013   PCOS (polycystic ovarian syndrome) 12/23/2013   Bariatric surgery status 01/28/2013    Alyza Artiaga PT, DPT 02/02/2021, 8:09 AM  [place] [address] [place], [place], [address] Phone: [phone]   Fax:  [phone]  Name: Kathryn Coffey MRN: [id number] Date of Birth: Mar 21, 1971

## 2021-02-03 ENCOUNTER — Ambulatory Visit: Payer: No Typology Code available for payment source | Admitting: Physical Therapy

## 2021-02-04 ENCOUNTER — Encounter: Payer: Self-pay | Admitting: Occupational Therapy

## 2021-02-04 NOTE — Therapy (Signed)
[place] MAIN [facility] SERVICES [address] [place], [place], [address] Phone: [phone]   Fax:  [phone]  Occupational Therapy Treatment  Patient Details  Name: Kathryn Coffey MRN: [id number] Date of Birth: February 19, 1971 No data recorded  Encounter Date: 01/25/2021   OT End of Session - 02/04/21 1549     Visit Number 5    Number of Visits 25    Date for OT Re-Evaluation 02/28/21    Authorization Type Progress report period starting  on 12/06/20    Authorization Time Period FOTO    OT Start Time 1305    OT Stop Time 1345    OT Time Calculation (min) 40 min    Activity Tolerance Patient tolerated treatment well    Behavior During Therapy [facility] for tasks assessed/performed             Past Medical History:  Diagnosis Date   Abdominal wall cellulitis 2013 and 2014   Asthma    allergic to grasses   B12 deficiency    Complication of anesthesia    Costochondritis    Diabetes mellitus without complication ([place])    Gastric anomaly    multiple small ulcers   GERD (gastroesophageal reflux disease)    Herpes 06/2018   POSSIBLY IN EYE- PT TAKING VALTREX AND WILL SEE OPTHAMOLOGIST TO SEE IF VALTREX IS WORKING   History of abnormal cervical Pap smear    History of Clostridium difficile colitis    History of hiatal hernia    History of kidney stones    Hypertension    IBS (irritable bowel syndrome)    Migraine    Morbid obesity ([place])    s/p attempted gastric banding now decompressed   PCOS (polycystic ovarian syndrome)    PONV (postoperative nausea and vomiting)    WITH SPINAL ONLY   Stroke [facility])     Past Surgical History:  Procedure Laterality Date   BREAST EXCISIONAL BIOPSY Left    cyst excision - Dr. Patty Sermons   CESAREAN SECTION     CHOLECYSTECTOMY     COLONOSCOPY     EAR CYST EXCISION Right 07/04/2018   Procedure: EXCISION GLOMUS TUMOR THUMBNAIL;  Surgeon: Corky Mull, MD;  Location: [facility] ORS;  Service: Orthopedics;   Laterality: Right;   ESOPHAGOGASTRODUODENOSCOPY     ESOPHAGOGASTRODUODENOSCOPY (EGD) WITH PROPOFOL N/A 12/06/2015   Procedure: ESOPHAGOGASTRODUODENOSCOPY (EGD) WITH PROPOFOL;  Surgeon: Manya Silvas, MD;  Location: [facility] ENDOSCOPY;  Service: Endoscopy;  Laterality: N/A;   IR ANGIO INTRA EXTRACRAN SEL INTERNAL CAROTID UNI L MOD SED  07/15/2019   IR ANGIO VERTEBRAL SEL SUBCLAVIAN INNOMINATE UNI R MOD SED  07/15/2019   IR CT HEAD LTD  07/15/2019   IR INTRAVSC STENT CERV CAROTID W/O EMB-PROT MOD SED INC ANGIO  07/15/2019   IR PERCUTANEOUS ART THROMBECTOMY/INFUSION INTRACRANIAL INC DIAG ANGIO  07/15/2019   LAPAROSCOPIC GASTRIC BANDING     LAPAROSCOPIC GASTRIC RESTRICTIVE DUODENAL PROCEDURE (DUODENAL SWITCH) N/A 01/25/2016   Procedure: LAPAROSCOPIC GASTRIC RESTRICTIVE DUODENAL PROCEDURE (DUODENAL SWITCH);  Surgeon: Ladora Daniel, MD;  Location: [facility] ORS;  Service: General;  Laterality: N/A;   OVARY SURGERY     x2   RADIOLOGY WITH ANESTHESIA N/A 07/15/2019   Procedure: IR WITH ANESTHESIA;  Surgeon: Luanne Bras, MD;  Location: [place];  Service: Radiology;  Laterality: N/A;   TONSILLECTOMY     TUBAL LIGATION     UMBILICAL HERNIA REPAIR N/A 01/25/2016   Procedure: LAPAROSCOPIC UMBILICAL HERNIA;  Surgeon: Ladora Daniel, MD;  Location: [facility] ORS;  Service: General;  Laterality: N/A;   UMBILICAL HERNIA REPAIR N/A 10/20/2016   Procedure: HERNIA REPAIR UMBILICAL ADULT;  Surgeon: [place]ie Green, MD;  Location: [facility] ORS;  Service: General;  Laterality: N/A;    There were no vitals filed for this visit.   Subjective Assessment - 02/04/21 1547     Subjective  Pt reports she has been busy, using hand at times, still difficulty with typing and coordination    Pertinent History 50 year old female with history of CVA on 07/15/2019 with left sided weakness. Pt previously completed inpatient rehab and outpatient therapy services. Patient has PMH significant for DM, GERD, HTN, Asthma, Herpes,  Costochondritis, Vit B12 deficiency.    Patient Stated Goals Patient would like to be as independent as possible, return to her prior level of function    Currently in Pain? No/denies    Pain Score 0-No pain            Pt working 4 hours per day in the mornings, going well, gets fatigued as the day goes on.  Typing required for her job, slow to complete emails, longer passages of text.  Job does not have a significant amount of typing but constant computer use to review charts, uses numbers more.    Neuromuscular reeducation: Patient seen for right hand function tasks with focus on coordination skills, manipulation of [place] discs to pick up, flip and turn, cues for isolated finger movements for manipulation.  Pt tends to hike shoulder at times when reaching to participate in tabletop activities but responds well to cues.  Manipulation of small 1/2 inch objects from tabletop, picking up, attempts to use translatory skills of the hand and using the hand for storage.    Response to tx: Pt continues to demonstrate limitations in reaching tasks, coordination and manipulation of objects.  Tends to demo hiking of right shoulder with attempts to perform tabletop activities.  Patient dropping items frequently, cues for isolated finger movements with manipulation, slow and deliberate movements utilized.  Continue to work towards goals in plan of care to maximize safety and independence in necessary daily tasks at home, work and in the community.                     OT Education - 02/04/21 1548     Education Details HEP    Person(s) Educated Patient    Methods Explanation;Demonstration;Handout    Comprehension Verbalized understanding;Returned demonstration              OT Short Term Goals - 12/06/20 1525       OT SHORT TERM GOAL #1   Title Pt will independently complete HEP.    Baseline eval: pt completes mirror box therapy and [place] exercises    Time 6    Period Weeks     Status New    Target Date 01/17/21      OT SHORT TERM GOAL #2   Title Pt will cut meat with MOD I utilizing adaptive equipment as needed    Baseline Eval: uses good grips, requires MIN A    Time 6    Period Weeks    Status New    Target Date 01/17/21               OT Long Term Goals - 12/08/20 1149       OT LONG TERM GOAL #1   Title Pt will  increase left shoulder flexion AROM by 20 degrees to complete hair washing/brushing with LUE.    Baseline Eval: seated shoulder flexion AROM 125*    Time 12    Period Weeks    Status New      OT LONG TERM GOAL #2   Title Pt will increase left grip by 15# to assist with picking up objects and opening cans.    Baseline Eval: L grip 25#    Time 12    Period Weeks    Status New      OT LONG TERM GOAL #3   Title Pt will Independently don/doff bra in sitting    Baseline Eval: unable to hook/unhook    Time 12    Period Weeks    Status New      OT LONG TERM GOAL #4   Title Pt will improve L hand PEG time by 1 min to improve [place] and manipulate small objects during I/ADLs including folding laundry.    Baseline Eval: PEG L 3 min 59 sec    Time 12    Period Weeks    Status New      OT LONG TERM GOAL #5   Title Pt will improve FOTO score 49 or greater to show clinically relevant change to engage in ADL and IADL tasks with greater independence    Baseline Eval: FOTO 45    Time 12    Period Weeks    Status New    Target Date 02/28/21                   Plan - 02/04/21 1550     Clinical Impression Statement Pt continues to demonstrate limitations in reaching tasks, coordination and manipulation of objects.  Tends to demo hiking of right shoulder with attempts to perform tabletop activities.  Patient dropping items frequently, cues for isolated finger movements with manipulation, slow and deliberate movements utilized.  Continue to work towards goals in plan of care to maximize safety and independence in necessary daily tasks  at home, work and in the community.    OT Occupational Profile and History Detailed Assessment- Review of Records and additional review of physical, cognitive, psychosocial history related to current functional performance    Occupational performance deficits (Please refer to evaluation for details): ADL's;IADL's    Rehab Potential Good    Clinical Decision Making Several treatment options, min-mod task modification necessary    Comorbidities Affecting Occupational Performance: May have comorbidities impacting occupational performance    Modification or Assistance to Complete Evaluation  Min-Moderate modification of tasks or assist with assess necessary to complete eval    OT Frequency 2x / week    OT Duration 12 weeks    OT Treatment/Interventions Self-care/ADL training;Moist Heat;DME and/or AE instruction;Neuromuscular education;Therapeutic exercise;Therapeutic activities;Energy conservation;Patient/family education    OT Home Exercise Plan theraputty    Consulted and Agree with Plan of Care Patient             Patient will benefit from skilled therapeutic intervention in order to improve the following deficits and impairments:           Visit Diagnosis: Other lack of coordination  Muscle weakness (generalized)    Problem List Patient Active Problem List   Diagnosis Date Noted   E. coli UTI    Diabetes mellitus type 2 in obese (HCC)    Chronic pain of right knee    Flaccid monoplegia of upper extremity (HCC)    Hemiparesis  affecting left side as late effect of stroke ([place])    Acute ischemic right middle cerebral artery (MCA) stroke ([place]) 07/22/2019   Carotid artery dissection  ([place]) s/p stent placement 07/21/2019   Diabetes mellitus type II, uncontrolled ([place]) 07/21/2019   Acute blood loss anemia 07/21/2019   Leukocytosis 07/21/2019   Stroke (cerebrum) ([place]) - R MCA infarct s/p tenecteplase and mechanical thrombectomy w/ d/t ICA dissection  07/15/2019   Middle cerebral  artery embolism, right 07/15/2019   Controlled type 2 diabetes mellitus without complication, without long-term current use of insulin ([place]) 05/28/2017   Attention deficit hyperactivity disorder (ADHD), combined type 04/18/2017   Morbid obesity ([place]) 29/24/4628   Umbilical hernia 63/81/7711   Hiatal hernia 12/08/2015   History of Clostridium difficile colitis 06/19/2014   HTN (hypertension) 12/23/2013   PCOS (polycystic ovarian syndrome) 12/23/2013   Bariatric surgery status 01/28/2013   Ovella Manygoats T Honestee Revard, OTR/L, CLT  Muaad Boehning 02/04/2021, 4:05 PM  [place] MAIN [facility] SERVICES [address] [place], [place], [address] Phone: [phone]   Fax:  [phone]  Name: MICKAELA STARLIN MRN: [id number] Date of Birth: 1970-11-14

## 2021-02-08 ENCOUNTER — Ambulatory Visit: Payer: No Typology Code available for payment source

## 2021-02-08 ENCOUNTER — Other Ambulatory Visit: Payer: Self-pay

## 2021-02-08 ENCOUNTER — Ambulatory Visit: Payer: No Typology Code available for payment source | Admitting: Physical Therapy

## 2021-02-08 MED FILL — Omeprazole Cap Delayed Release 20 MG: ORAL | 90 days supply | Qty: 90 | Fill #0 | Status: AC

## 2021-02-09 ENCOUNTER — Other Ambulatory Visit: Payer: Self-pay

## 2021-02-09 MED ORDER — ESCITALOPRAM OXALATE 10 MG PO TABS
ORAL_TABLET | ORAL | 2 refills | Status: DC
  Filled 2021-02-09: qty 90, 90d supply, fill #0
  Filled 2021-05-11 (×2): qty 90, 90d supply, fill #1
  Filled 2021-08-10: qty 90, 90d supply, fill #2

## 2021-02-10 ENCOUNTER — Ambulatory Visit: Payer: No Typology Code available for payment source | Admitting: Physical Therapy

## 2021-02-15 ENCOUNTER — Encounter: Payer: Self-pay | Admitting: Physical Therapy

## 2021-02-15 ENCOUNTER — Ambulatory Visit: Payer: No Typology Code available for payment source | Admitting: Physical Therapy

## 2021-02-15 ENCOUNTER — Other Ambulatory Visit: Payer: Self-pay

## 2021-02-15 ENCOUNTER — Ambulatory Visit: Payer: No Typology Code available for payment source | Admitting: Occupational Therapy

## 2021-02-15 DIAGNOSIS — R278 Other lack of coordination: Secondary | ICD-10-CM

## 2021-02-15 DIAGNOSIS — R2689 Other abnormalities of gait and mobility: Secondary | ICD-10-CM

## 2021-02-15 DIAGNOSIS — M6281 Muscle weakness (generalized): Secondary | ICD-10-CM | POA: Diagnosis not present

## 2021-02-15 DIAGNOSIS — R262 Difficulty in walking, not elsewhere classified: Secondary | ICD-10-CM

## 2021-02-15 DIAGNOSIS — R293 Abnormal posture: Secondary | ICD-10-CM

## 2021-02-15 DIAGNOSIS — M542 Cervicalgia: Secondary | ICD-10-CM

## 2021-02-15 MED ORDER — LOSARTAN POTASSIUM-HCTZ 100-12.5 MG PO TABS
1.0000 | ORAL_TABLET | Freq: Every day | ORAL | 11 refills | Status: DC
  Filled 2021-03-30: qty 30, 30d supply, fill #0
  Filled 2021-04-26: qty 30, 30d supply, fill #1
  Filled 2021-05-26: qty 30, 30d supply, fill #2
  Filled 2021-07-01: qty 30, 30d supply, fill #3
  Filled 2021-07-27: qty 30, 30d supply, fill #4
  Filled 2021-08-31: qty 30, 30d supply, fill #5

## 2021-02-15 MED ORDER — LOSARTAN POTASSIUM-HCTZ 100-12.5 MG PO TABS
ORAL_TABLET | ORAL | 1 refills | Status: DC
  Filled 2021-02-15: qty 45, 90d supply, fill #0

## 2021-02-16 ENCOUNTER — Encounter: Payer: Self-pay | Admitting: Occupational Therapy

## 2021-02-16 NOTE — Therapy (Signed)
[place] MAIN [facility] SERVICES [address] [place], [place], [address] Phone: [phone]   Fax:  [phone]  Occupational Therapy Treatment  Patient Details  Name: Kathryn Coffey MRN: [id number] Date of Birth: 12/05/1970 No data recorded  Encounter Date: 02/15/2021   OT End of Session - 02/16/21 1318     Visit Number 6    Number of Visits 25    Date for OT Re-Evaluation 05/11/21    Authorization Type Progress report period starting  on 12/06/20    Authorization Time Period FOTO    OT Start Time 48    OT Stop Time 1346    OT Time Calculation (min) 46 min    Activity Tolerance Patient tolerated treatment well    Behavior During Therapy [facility] for tasks assessed/performed             Past Medical History:  Diagnosis Date   Abdominal wall cellulitis 2013 and 2014   Asthma    allergic to grasses   B12 deficiency    Complication of anesthesia    Costochondritis    Diabetes mellitus without complication ([place])    Gastric anomaly    multiple small ulcers   GERD (gastroesophageal reflux disease)    Herpes 06/2018   POSSIBLY IN EYE- PT TAKING VALTREX AND WILL SEE OPTHAMOLOGIST TO SEE IF VALTREX IS WORKING   History of abnormal cervical Pap smear    History of Clostridium difficile colitis    History of hiatal hernia    History of kidney stones    Hypertension    IBS (irritable bowel syndrome)    Migraine    Morbid obesity ([place])    s/p attempted gastric banding now decompressed   PCOS (polycystic ovarian syndrome)    PONV (postoperative nausea and vomiting)    WITH SPINAL ONLY   Stroke [facility])     Past Surgical History:  Procedure Laterality Date   BREAST EXCISIONAL BIOPSY Left    cyst excision - Dr. Patty Sermons   CESAREAN SECTION     CHOLECYSTECTOMY     COLONOSCOPY     EAR CYST EXCISION Right 07/04/2018   Procedure: EXCISION GLOMUS TUMOR THUMBNAIL;  Surgeon: Corky Mull, MD;  Location: [facility] ORS;  Service: Orthopedics;   Laterality: Right;   ESOPHAGOGASTRODUODENOSCOPY     ESOPHAGOGASTRODUODENOSCOPY (EGD) WITH PROPOFOL N/A 12/06/2015   Procedure: ESOPHAGOGASTRODUODENOSCOPY (EGD) WITH PROPOFOL;  Surgeon: Manya Silvas, MD;  Location: [facility] ENDOSCOPY;  Service: Endoscopy;  Laterality: N/A;   IR ANGIO INTRA EXTRACRAN SEL INTERNAL CAROTID UNI L MOD SED  07/15/2019   IR ANGIO VERTEBRAL SEL SUBCLAVIAN INNOMINATE UNI R MOD SED  07/15/2019   IR CT HEAD LTD  07/15/2019   IR INTRAVSC STENT CERV CAROTID W/O EMB-PROT MOD SED INC ANGIO  07/15/2019   IR PERCUTANEOUS ART THROMBECTOMY/INFUSION INTRACRANIAL INC DIAG ANGIO  07/15/2019   LAPAROSCOPIC GASTRIC BANDING     LAPAROSCOPIC GASTRIC RESTRICTIVE DUODENAL PROCEDURE (DUODENAL SWITCH) N/A 01/25/2016   Procedure: LAPAROSCOPIC GASTRIC RESTRICTIVE DUODENAL PROCEDURE (DUODENAL SWITCH);  Surgeon: Ladora Daniel, MD;  Location: [facility] ORS;  Service: General;  Laterality: N/A;   OVARY SURGERY     x2   RADIOLOGY WITH ANESTHESIA N/A 07/15/2019   Procedure: IR WITH ANESTHESIA;  Surgeon: Luanne Bras, MD;  Location: [place];  Service: Radiology;  Laterality: N/A;   TONSILLECTOMY     TUBAL LIGATION     UMBILICAL HERNIA REPAIR N/A 01/25/2016   Procedure: LAPAROSCOPIC UMBILICAL HERNIA;  Surgeon: Ladora Daniel, MD;  Location: [facility] ORS;  Service: General;  Laterality: N/A;   UMBILICAL HERNIA REPAIR N/A 10/20/2016   Procedure: HERNIA REPAIR UMBILICAL ADULT;  Surgeon: Leonie Green, MD;  Location: [facility] ORS;  Service: General;  Laterality: N/A;    There were no vitals filed for this visit.   Subjective Assessment - 02/16/21 1314     Subjective  Pt reports she is still working 4 hours a day, early morning shift.  She reports decreased concentration, attention and focus as the day goes on.    Pertinent History 50 year old female with history of CVA on 07/15/2019 with left sided weakness. Pt previously completed inpatient rehab and outpatient therapy services. Patient has PMH  significant for DM, GERD, HTN, Asthma, Herpes, Costochondritis, Vit B12 deficiency.    Limitations LUE AROM, Martin    Patient Stated Goals Patient would like to be as independent as possible, return to her prior level of function    Currently in Pain? Yes    Pain Score 1     Pain Location Arm    Pain Orientation Left    Pain Descriptors / Indicators Aching    Pain Type Chronic pain                [facility] OT Assessment - 02/16/21 1312       Coordination   Right 9 Hole Peg Test 24    Left 9 Hole Peg Test 1 min 1 sec      Hand Function   Right Hand Grip (lbs) 70    Right Hand Lateral Pinch 17 lbs    Right Hand 3 Point Pinch 20 lbs    Left Hand Grip (lbs) 22    Left Hand Lateral Pinch 11 lbs    Left 3 point pinch 8 lbs    Comment 2 point pinch R 13#, Left 6#            Pt seen for reassessment of LUE, hand function as noted in above flowsheets, please refer for current metrics  ADL assessment: Self-care skills: Patient is able to complete basic dressing skills daily with the exception of being able to tie her shoes, she wears slip on shoes currently.  She requires assistance for donning and doffing her bra.  She is now able to put on her deodorant using her left hand.  With grooming, she can complete oral care with her right hand.  Patient continues to demonstrate difficulty with performing hair care, she is unable to use the left hand to style her hair and has been working towards being able to put her hair up with a clip.  All self-care tasks require increased time to complete. IADLs: Patient is able to complete light shopping tasks however she requires assistance when purchasing heavier items such as a case of water, loading bags from the cart to the car and managing heavy items which require 2 hands.  She continues to demonstrate spasticity in her left toe, ankle and knee which affect her balance with walking in the grocery store and surrounding areas.  With housekeeping, she  is able to perform light homemaking tasks but requires increased time.  She is able to assist with folding laundry but has a difficult time maintaining her grip onto the fabric and lacks precision with folding.  She is able to grossly hold a laundry basket if it has a handle and is not heavy or full of items.   Meal preparation,  she continues to require increased time to complete all tasks.  She has implemented modified tools during meal preparation such as good grips tools which have a larger surface handle and she has a modified cutting board which stabilizes items so that she can cut with her unaffected dominant right hand.  She requires assistance to put items in and out of the oven, she is unable to manage a hot dish which is a safety issue.  She has difficulty with lifting larger, heavier pots especially ones that contain food such as noodles to drain.  She is able to follow a recipe however has some forgetfulness mid activity and can be easily distracted.  With financial management, she previously budgeted mentally however now she requires a paper to form her budget.  She has some mild difficulty with judging payment dates and processing times which has resulted in late payments recently.  She reports her mortgage payment was late for the first time in 22 years.  She has had difficulty with drafting for a loan in the last couple months, 1 month she forgot to perform the draft and the next month she missed the date for the draft. Medication management: Patient reports she has been out of her blood pressure pills for almost a week, she kept forgetting to call to get the prescription refilled.   Work: Recent typing test revealed decreased proficiency with typing, patient has difficulty with managing her left hand on the home row keys and coordination for these isolated finger movements required and lacks speed.  Patient also has to now stop to write down bullet points regarding her work and has a decreased  recall of diagnoses for coding.  She requires increased time to read and decipher medical OP reports and requires increased time to review charts to process the information.  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 Patient continues to work 4 hours a day in the morning which is her most alert time of day.  Beyond 4 hours she experiences increased fatigue, increased processing times and forgetfulness.  She appears to have issues with overstimulation especially when trying to perform tasks when there is a background noise or distractions including people talking.  She reports multitasking used to be one of her strengths.  Response to tx: Patient has continued to make progress in select areas.  Her shoulder range of motion on the left has improved to 134 degrees with shoulder flexion and 140 degrees with abduction, minimal pain rated 1 out of 10.  Patient's left three-point pinch improved from 5 pounds to 8 pounds, lateral pinch stayed the same and grip slightly decreased on the left.  She had a significant improvement with her left nine-hole peg test improving from 3 minutes 59 seconds to 1 minute 1 second.  Patient continues to demonstrate spasticity in her digits with finger extension.  She is able to produce full fisting and full extension however is not able to replicate this movement pattern more than 5-6 repetitions before her flexor tone engages.  She continues to have difficulty with being able to tie her shoes, manage her hair, lifting heavier items with shopping, meal prep and housekeeping chores.  She demonstrates some difficulty with divided attention and appears to have some issues with modulation of stimulation with background noise, distractions, and conversation during tasks.  She is currently working 4 hours a  day, in the morning when she is most alert however she has  difficulty with concentration and attention as the day goes on.  She has increased difficulty with reviewing charts and processing information which has resulted in decreased work performance.  She continues to benefit from skilled Occupational Therapy to further address her goals and improve her independence at home, work and in the community.                    OT Education - 02/16/21 1317     Education Details goals, plan of care, exercises    Person(s) Educated Patient    Methods Explanation;Demonstration;Handout    Comprehension Verbalized understanding;Returned demonstration              OT Short Term Goals - 02/16/21 1329       OT SHORT TERM GOAL #1   Title Pt will independently complete HEP.    Baseline eval: pt completes mirror box therapy and [place] exercises    Time 6    Period Weeks    Status On-going    Target Date 05/11/21      OT SHORT TERM GOAL #2   Title Pt will cut meat with MOD I utilizing adaptive equipment as needed    Baseline Eval: uses good grips, requires MIN A, 6th visit: continued use of larger grip utensils, occasional assist with cutting    Time 6    Period Weeks    Status On-going    Target Date 03/30/21               OT Long Term Goals - 02/16/21 1320       OT LONG TERM GOAL #1   Title Pt will increase left shoulder flexion AROM by 20 degrees to complete hair washing/brushing with LUE.    Baseline Eval: seated shoulder flexion AROM 125*, 6th visit: left shoulder flexion 134, difficulty reaching to manage hair    Time 12    Period Weeks    Status On-going    Target Date 05/11/21      OT LONG TERM GOAL #2   Title Pt will increase left grip by 15# to assist with picking up objects and opening cans.    Baseline Eval: L grip 25#, 6th visit: 22#, continued difficulty with picking up items with left hand securely    Time 12    Period Weeks    Status On-going    Target Date 05/11/21      OT LONG TERM GOAL #3   Title Pt  will Independently don/doff bra in sitting    Baseline Eval: unable to hook/unhook, 6th visit:  can snap and pull over but cannot hook when on body    Time 12    Period Weeks    Status On-going    Target Date 05/11/21      OT LONG TERM GOAL #4   Title Pt will improve L hand PEG time to less than 1 min to improve [place] and manipulate small objects during I/ADLs including folding laundry.    Baseline Eval: PEG L 3 min 59 sec, 6th visit: 1 min 1 sec still has difficulty with folding laundry neatly.    Time 12    Period Weeks    Status Revised    Target Date 05/11/21      OT LONG TERM GOAL #5   Title Pt will improve FOTO score 49 or greater to show clinically relevant change to engage in  ADL and IADL tasks with greater independence    Baseline Eval: FOTO 45, 6ht visit: 31    Time 12    Period Weeks    Status On-going    Target Date 05/12/21      Long Term Additional Goals   Additional Long Term Goals Yes      OT LONG TERM GOAL #6   Title Pt to demonstrate improved score on typing proficiency test greater than 20 WPM    Baseline avg score is currently 10-12 WPM    Time 12    Period Weeks    Status New    Target Date 05/11/21      OT LONG TERM GOAL #7   Title Pt will demonstrate knowledge of strategies to manage bill paying with no late payments in a month.    Baseline has had several late payments recently    Time 6    Period Weeks    Status New    Target Date 03/30/21      OT LONG TERM GOAL #8   Title Pt will complete laundry with modified independence, folding with precision.    Baseline messy folding, lacking precision    Time 12    Period Weeks    Status New    Target Date 05/11/21      OT LONG TERM GOAL  #9   TITLE Pt will improve strength in left UE to manage all grocery bags from the cart to the car independently.    Baseline unable to manage heavier items    Time 12    Period Weeks    Status New    Target Date 05/11/21                   Plan -  02/16/21 1320     Clinical Impression Statement Patient has continued to make progress in select areas.  Her shoulder range of motion on the left has improved to 134 degrees with shoulder flexion and 140 degrees with abduction, minimal pain rated 1 out of 10.  Patient's left three-point pinch improved from 5 pounds to 8 pounds, lateral pinch stayed the same and grip slightly decreased on the left.  She had a significant improvement with her left nine-hole peg test improving from 3 minutes 59 seconds to 1 minute 1 second.  Patient continues to demonstrate spasticity in her digits with finger extension.  She is able to produce full fisting and full extension however is not able to replicate this movement pattern more than 5-6 repetitions before her flexor tone engages.  She continues to have difficulty with being able to tie her shoes, manage her hair, lifting heavier items with shopping, meal prep and housekeeping chores.  She demonstrates some difficulty with divided attention and appears to have some issues with modulation of stimulation with background noise, distractions, and conversation during tasks.  She is currently working 4 hours a day, in the morning when she is most alert however she has difficulty with concentration and attention as the day goes on.  She has increased difficulty with reviewing charts and processing information which has resulted in decreased work performance.  She continues to benefit from skilled Occupational Therapy to further address her goals and improve her independence at home, work and in the community.    OT Occupational Profile and History Detailed Assessment- Review of Records and additional review of physical, cognitive, psychosocial history related to current functional performance    Occupational performance deficits (  Please refer to evaluation for details): ADL's;IADL's    Rehab Potential Good    Clinical Decision Making Several treatment options, min-mod task  modification necessary    Comorbidities Affecting Occupational Performance: May have comorbidities impacting occupational performance    Modification or Assistance to Complete Evaluation  Min-Moderate modification of tasks or assist with assess necessary to complete eval    OT Frequency 2x / week    OT Duration 12 weeks    OT Treatment/Interventions Self-care/ADL training;Moist Heat;DME and/or AE instruction;Neuromuscular education;Therapeutic exercise;Therapeutic activities;Energy conservation;Patient/family education    OT Home Exercise Plan theraputty    Consulted and Agree with Plan of Care Patient             Patient will benefit from skilled therapeutic intervention in order to improve the following deficits and impairments:           Visit Diagnosis: Muscle weakness (generalized)  Other lack of coordination  Difficulty in walking, not elsewhere classified    Problem List Patient Active Problem List   Diagnosis Date Noted   E. coli UTI    Diabetes mellitus type 2 in obese (HCC)    Chronic pain of right knee    Flaccid monoplegia of upper extremity (HCC)    Hemiparesis affecting left side as late effect of stroke ([place])    Acute ischemic right middle cerebral artery (MCA) stroke ([place]) 07/22/2019   Carotid artery dissection  ([place]) s/p stent placement 07/21/2019   Diabetes mellitus type II, uncontrolled ([place]) 07/21/2019   Acute blood loss anemia 07/21/2019   Leukocytosis 07/21/2019   Stroke (cerebrum) ([place]) - R MCA infarct s/p tenecteplase and mechanical thrombectomy w/ d/t ICA dissection  07/15/2019   Middle cerebral artery embolism, right 07/15/2019   Controlled type 2 diabetes mellitus without complication, without long-term current use of insulin ([place]) 05/28/2017   Attention deficit hyperactivity disorder (ADHD), combined type 04/18/2017   Morbid obesity ([place]) 54/62/7035   Umbilical hernia 00/93/8182   Hiatal hernia 12/08/2015   History of Clostridium difficile  colitis 06/19/2014   HTN (hypertension) 12/23/2013   PCOS (polycystic ovarian syndrome) 12/23/2013   Bariatric surgery status 01/28/2013   Remmy Crass T Rayel Santizo, OTR/L, CLT  Marcel Gary 02/16/2021, 2:09 PM  [place] MAIN [facility] SERVICES [address] [place], [place], [address] Phone: [phone]   Fax:  [phone]  Name: SHARIYAH ELAND MRN: [id number] Date of Birth: October 16, 1970

## 2021-02-16 NOTE — Therapy (Signed)
[place] [facility] SERVICES [address] [place], [place], [address] Phone: [phone]   Fax:  [phone]  Physical Therapy Treatment  Patient Details  Name: Kathryn Coffey MRN: [id number] Date of Birth: May 28, 1971 Referring Provider (PT): shah   Encounter Date: 02/15/2021   PT End of Session - 02/16/21 0837     Visit Number 12    Number of Visits 15    Date for PT Re-Evaluation 02/23/21    Authorization Time Period Eval 11/22/20 for LE weakness; Re-eval 12/29/20    PT Start Time 8676    PT Stop Time 1430    PT Time Calculation (min) 43 min    Activity Tolerance Patient tolerated treatment well;No increased pain    Behavior During Therapy [facility] for tasks assessed/performed             Past Medical History:  Diagnosis Date   Abdominal wall cellulitis 2013 and 2014   Asthma    allergic to grasses   B12 deficiency    Complication of anesthesia    Costochondritis    Diabetes mellitus without complication (HCC)    Gastric anomaly    multiple small ulcers   GERD (gastroesophageal reflux disease)    Herpes 06/2018   POSSIBLY IN EYE- PT TAKING VALTREX AND WILL SEE OPTHAMOLOGIST TO SEE IF VALTREX IS WORKING   History of abnormal cervical Pap smear    History of Clostridium difficile colitis    History of hiatal hernia    History of kidney stones    Hypertension    IBS (irritable bowel syndrome)    Migraine    Morbid obesity ([place])    s/p attempted gastric banding now decompressed   PCOS (polycystic ovarian syndrome)    PONV (postoperative nausea and vomiting)    WITH SPINAL ONLY   Stroke [facility])     Past Surgical History:  Procedure Laterality Date   BREAST EXCISIONAL BIOPSY Left    cyst excision - Dr. Patty Sermons   CESAREAN SECTION     CHOLECYSTECTOMY     COLONOSCOPY     EAR CYST EXCISION Right 07/04/2018   Procedure: EXCISION GLOMUS TUMOR THUMBNAIL;  Surgeon: Corky Mull, MD;  Location: [facility] ORS;  Service: Orthopedics;   Laterality: Right;   ESOPHAGOGASTRODUODENOSCOPY     ESOPHAGOGASTRODUODENOSCOPY (EGD) WITH PROPOFOL N/A 12/06/2015   Procedure: ESOPHAGOGASTRODUODENOSCOPY (EGD) WITH PROPOFOL;  Surgeon: Manya Silvas, MD;  Location: [facility] ENDOSCOPY;  Service: Endoscopy;  Laterality: N/A;   IR ANGIO INTRA EXTRACRAN SEL INTERNAL CAROTID UNI L MOD SED  07/15/2019   IR ANGIO VERTEBRAL SEL SUBCLAVIAN INNOMINATE UNI R MOD SED  07/15/2019   IR CT HEAD LTD  07/15/2019   IR INTRAVSC STENT CERV CAROTID W/O EMB-PROT MOD SED INC ANGIO  07/15/2019   IR PERCUTANEOUS ART THROMBECTOMY/INFUSION INTRACRANIAL INC DIAG ANGIO  07/15/2019   LAPAROSCOPIC GASTRIC BANDING     LAPAROSCOPIC GASTRIC RESTRICTIVE DUODENAL PROCEDURE (DUODENAL SWITCH) N/A 01/25/2016   Procedure: LAPAROSCOPIC GASTRIC RESTRICTIVE DUODENAL PROCEDURE (DUODENAL SWITCH);  Surgeon: Ladora Daniel, MD;  Location: [facility] ORS;  Service: General;  Laterality: N/A;   OVARY SURGERY     x2   RADIOLOGY WITH ANESTHESIA N/A 07/15/2019   Procedure: IR WITH ANESTHESIA;  Surgeon: Luanne Bras, MD;  Location: [place];  Service: Radiology;  Laterality: N/A;   TONSILLECTOMY     TUBAL LIGATION     UMBILICAL HERNIA REPAIR N/A 01/25/2016   Procedure: LAPAROSCOPIC UMBILICAL HERNIA;  Surgeon: Legrand Como  Jed Limerick, MD;  Location: [facility] ORS;  Service: General;  Laterality: N/A;   UMBILICAL HERNIA REPAIR N/A 10/20/2016   Procedure: HERNIA REPAIR UMBILICAL ADULT;  Surgeon: Leonie Green, MD;  Location: [facility] ORS;  Service: General;  Laterality: N/A;    There were no vitals filed for this visit.   Subjective Assessment - 02/15/21 1353     Subjective Patient reports increased stiffness in neck and believes it is related to her BP medication. She has continued numbness in RUE. She is scheduled for nerve conduction test next week. She reports relief from  symptoms for 3 hours after last session;    Patient is accompained by: Family member   husband, Ray   Pertinent History 50 year old  female with history of CVA on 07/15/2019 with left sided weakness. Patient has PMH significant for DM, GERD, HTN, Asthma, Herpes, Costochondritis, Vit B12 deficiency. Patient did get repeat MRI with Dr. Manuella Ghazi with consistent bulging disk at C5-C6 with right side radiculopathy;    Limitations Lifting;Standing;Walking    How long can you sit comfortably? no restrictions    How long can you stand comfortably? Limited with prolonged standing - around 30 min    How long can you walk comfortably? around 30 min    Patient Stated Goals I want to improve my strength and increase my stability with standing/walking    Currently in Pain? No/denies    Pain Onset Other (comment)   Chronic since 06/2019   Multiple Pain Sites No                TREATMENT: Patient reports continued numbness in RUE hand; reports less pain in RUE shoulder.    PT instructed patient in left sidelying: PT inspected RUE scapular paraspinals with increased tightness noted. PT performed myofascial release to right scapular paraspinals x10 min; Patient initially reported mild to moderate tenderness with minimal movement of scapular noted. However with increased manual therapy, increased extensibility of scapular observed with less symptoms down RUE   Patient instructed in rolling supine:     PT performed manual therapy to reduce tightness/stiffness:   PT performed suboccipital release 30 sec hold, 10 sec rest x5 min with good tolerance; PT performed passive cervical stretches: -upper trap stretch 30 sec hold x2 reps bilaterally; -levator scapulae stretch with overpressure along scapular angle 30 sec hold x2 reps, Right side only;  PT performed passive RUE median nerve stretch 10 sec hold x5 reps with increased numbness/tingling reported in right hand with stretch;   PT performed gentle cervical distraction 30 sec hold, 15 sec rest x10 min with reports of less numbness/tingling in RUE hand with distraction;   PT  performed gentle cervical distraction with central PA mobs grade 1 to facilitate better mobility and reduce discomfort x2-3 min;   Finished with suboccipital release x1 min with improved head positioning and less forward head noted;    Patient tolerated session well. She reports no pain down RUE and no numbness. She also reports no tightness in right UE. She does state that while symptoms have resolved after therapy session she reports it only lasts for short duration as symptoms quickly return during the day.                             PT Education - 02/16/21 0836     Education Details manual therapy/postural re-education;    Person(s) Educated Patient    Methods Explanation;Verbal  cues    Comprehension Verbalized understanding;Returned demonstration;Verbal cues required;Need further instruction              PT Short Term Goals - 01/25/21 1444       PT SHORT TERM GOAL #1   Title Pt will be independent with HEP in order to improve strength and balance in order to decrease fall risk and improve function at home and work.    Baseline 11/22/2020- Patient has no formal HEP in place.    Time 4    Period Weeks    Status On-going    Target Date 01/26/21      PT SHORT TERM GOAL #2   Title Patient will increase cervical AROM rotation >60 degrees bilaterally for increased functional ROM for turning head when driving;    Time 4    Period Weeks    Status On-going    Target Date 01/26/21               PT Long Term Goals - 01/25/21 1444       PT LONG TERM GOAL #1   Title Pt will improve DGI by at least 3 points in order to demonstrate clinically significant improvement in balance and decreased risk for falls.    Baseline 11/22/2020= DGI score= 15/24    Time 8    Period Weeks    Status On-going    Target Date 02/23/21      PT LONG TERM GOAL #2   Title Pt will report reduction in numbness in right hand by 50% to improve tolerance with ADLs and increase  safety within home    Baseline 5/11- numbness in RUE hand is constant    Time 8    Period Weeks    Status On-going    Target Date 02/23/21      PT LONG TERM GOAL #3   Title Pt will decrease TUG to below 11 seconds/decrease in order to demonstrate decreased fall risk.    Baseline 11/22/2020= 13.34 sec without an AD    Time 8    Period Weeks    Status On-going    Target Date 02/23/21      PT LONG TERM GOAL #4   Title Patient will increase FOTO score to equal to or greater than 66  to demonstrate statistically significant improvement in mobility and quality of life.    Baseline 11/22/2020= FOTO= 62    Time 8    Period Weeks    Status On-going    Target Date 02/23/21      PT LONG TERM GOAL #5   Title Patient will report a worst pain of 3/10 on VAS (neck/RUE)  to improve tolerance with ADLs and reduced symptoms with activities.    Baseline 5/11- reports variable pain in neck/RUE    Time 8    Period Weeks    Status Achieved    Target Date 02/23/21                   Plan - 02/16/21 0837     Clinical Impression Statement Patient continues to have numbness/tingling in RUE. This is alleviated with manual therapy including myofascial release to right scapular paraspinals as well as cervical stretches and gentle distraction. Patient does report increased numbness/tingling in RUE hand with median nerve stretch. Numbness is alleviated with manual therapy including gentle distraction. She is scheduled for NCV testing next week to determine extent of nerve impingement. Patient reports no symptoms at end of  session. She would benefit from additional skilled PT intervention to improve tissue extensibility and reduce pain/numbness down UE;    Personal Factors and Comorbidities Comorbidity 2    Comorbidities Diabetes, CVA    Examination-Activity Limitations Carry;Lift;Transfers    Examination-Participation Restrictions Occupation;Cleaning;Community Activity;Shop;Yard Work     Stability/Clinical Decision Making Stable/Uncomplicated    Rehab Potential Good    PT Frequency 1x / week    PT Duration 8 weeks    PT Treatment/Interventions ADLs/Self Care Home Management;Biofeedback;Electrical Stimulation;Moist Heat;DME Instruction;Gait training;Stair training;Functional mobility training;Therapeutic exercise;Balance training;Neuromuscular re-education;Therapeutic activities;Patient/family education;Manual techniques;Passive range of motion;Dry needling    PT Next Visit Plan continue with walkaide, work on cervical ROM/postural strengthening    PT Home Exercise Plan To be initiated next 1-2 visits.    Consulted and Agree with Plan of Care Patient             Patient will benefit from skilled therapeutic intervention in order to improve the following deficits and impairments:  Abnormal gait, Decreased activity tolerance, Decreased coordination, Decreased endurance, Decreased mobility, Decreased strength, Impaired tone, Impaired UE functional use, Pain, Hypomobility, Decreased range of motion, Postural dysfunction  Visit Diagnosis: Muscle weakness (generalized)  Other lack of coordination  Difficulty in walking, not elsewhere classified  Other abnormalities of gait and mobility  Cervicalgia  Abnormal posture     Problem List Patient Active Problem List   Diagnosis Date Noted   E. coli UTI    Diabetes mellitus type 2 in obese [facility])    Chronic pain of right knee    Flaccid monoplegia of upper extremity (HCC)    Hemiparesis affecting left side as late effect of stroke (HCC)    Acute ischemic right middle cerebral artery (MCA) stroke (HCC) 07/22/2019   Carotid artery dissection  (HCC) s/p stent placement 07/21/2019   Diabetes mellitus type II, uncontrolled ([place]) 07/21/2019   Acute blood loss anemia 07/21/2019   Leukocytosis 07/21/2019   Stroke (cerebrum) (HCC) - R MCA infarct s/p tenecteplase and mechanical thrombectomy w/ d/t ICA dissection  07/15/2019    Middle cerebral artery embolism, right 07/15/2019   Controlled type 2 diabetes mellitus without complication, without long-term current use of insulin ([place]) 05/28/2017   Attention deficit hyperactivity disorder (ADHD), combined type 04/18/2017   Morbid obesity ([place]) 62/83/1517   Umbilical hernia 61/60/7371   Hiatal hernia 12/08/2015   History of Clostridium difficile colitis 06/19/2014   HTN (hypertension) 12/23/2013   PCOS (polycystic ovarian syndrome) 12/23/2013   Bariatric surgery status 01/28/2013    Lyriq Jarchow PT, DPT 02/16/2021, 8:46 AM  [place] [address] [place], [place], [address] Phone: [phone]   Fax:  [phone]  Name: Kathryn Coffey MRN: [id number] Date of Birth: 28-Aug-1970

## 2021-02-17 ENCOUNTER — Other Ambulatory Visit: Payer: Self-pay

## 2021-02-17 ENCOUNTER — Ambulatory Visit: Payer: No Typology Code available for payment source | Admitting: Physical Therapy

## 2021-02-17 MED ORDER — TRULICITY 3 MG/0.5ML ~~LOC~~ SOAJ
SUBCUTANEOUS | 11 refills | Status: DC
  Filled 2021-02-17: qty 2, 28d supply, fill #0

## 2021-02-22 ENCOUNTER — Other Ambulatory Visit: Payer: Self-pay

## 2021-02-22 ENCOUNTER — Ambulatory Visit
Payer: No Typology Code available for payment source | Attending: Physical Medicine and Rehabilitation | Admitting: Occupational Therapy

## 2021-02-22 ENCOUNTER — Encounter: Payer: Self-pay | Admitting: Physical Therapy

## 2021-02-22 ENCOUNTER — Encounter: Payer: Self-pay | Admitting: Occupational Therapy

## 2021-02-22 ENCOUNTER — Ambulatory Visit: Payer: No Typology Code available for payment source | Admitting: Physical Therapy

## 2021-02-22 DIAGNOSIS — M6281 Muscle weakness (generalized): Secondary | ICD-10-CM | POA: Diagnosis not present

## 2021-02-22 DIAGNOSIS — R269 Unspecified abnormalities of gait and mobility: Secondary | ICD-10-CM | POA: Insufficient documentation

## 2021-02-22 DIAGNOSIS — R278 Other lack of coordination: Secondary | ICD-10-CM | POA: Diagnosis present

## 2021-02-22 DIAGNOSIS — R262 Difficulty in walking, not elsewhere classified: Secondary | ICD-10-CM

## 2021-02-22 DIAGNOSIS — R293 Abnormal posture: Secondary | ICD-10-CM | POA: Diagnosis present

## 2021-02-22 DIAGNOSIS — M542 Cervicalgia: Secondary | ICD-10-CM | POA: Insufficient documentation

## 2021-02-22 DIAGNOSIS — R2689 Other abnormalities of gait and mobility: Secondary | ICD-10-CM

## 2021-02-22 NOTE — Therapy (Signed)
[place] [facility] SERVICES [address] [place], [place], [address] Phone: [phone]   Fax:  [phone]  Physical Therapy Treatment  Patient Details  Name: Kathryn Coffey MRN: [id number] Date of Birth: Oct 20, 1970 Referring Provider (PT): shah   Encounter Date: 02/22/2021   PT End of Session - 02/22/21 1329     Visit Number 13    Number of Visits 19    Date for PT Re-Evaluation 03/22/21    Authorization Time Period Eval 11/22/20 for LE weakness; Re-eval 12/29/20    PT Start Time 1346    PT Stop Time 1430    PT Time Calculation (min) 44 min    Activity Tolerance Patient tolerated treatment well;No increased pain    Behavior During Therapy [facility] for tasks assessed/performed             Past Medical History:  Diagnosis Date   Abdominal wall cellulitis 2013 and 2014   Asthma    allergic to grasses   B12 deficiency    Complication of anesthesia    Costochondritis    Diabetes mellitus without complication (HCC)    Gastric anomaly    multiple small ulcers   GERD (gastroesophageal reflux disease)    Herpes 06/2018   POSSIBLY IN EYE- PT TAKING VALTREX AND WILL SEE OPTHAMOLOGIST TO SEE IF VALTREX IS WORKING   History of abnormal cervical Pap smear    History of Clostridium difficile colitis    History of hiatal hernia    History of kidney stones    Hypertension    IBS (irritable bowel syndrome)    Migraine    Morbid obesity ([place])    s/p attempted gastric banding now decompressed   PCOS (polycystic ovarian syndrome)    PONV (postoperative nausea and vomiting)    WITH SPINAL ONLY   Stroke [facility])     Past Surgical History:  Procedure Laterality Date   BREAST EXCISIONAL BIOPSY Left    cyst excision - Dr. Patty Sermons   CESAREAN SECTION     CHOLECYSTECTOMY     COLONOSCOPY     EAR CYST EXCISION Right 07/04/2018   Procedure: EXCISION GLOMUS TUMOR THUMBNAIL;  Surgeon: Corky Mull, MD;  Location: [facility] ORS;  Service: Orthopedics;   Laterality: Right;   ESOPHAGOGASTRODUODENOSCOPY     ESOPHAGOGASTRODUODENOSCOPY (EGD) WITH PROPOFOL N/A 12/06/2015   Procedure: ESOPHAGOGASTRODUODENOSCOPY (EGD) WITH PROPOFOL;  Surgeon: Manya Silvas, MD;  Location: [facility] ENDOSCOPY;  Service: Endoscopy;  Laterality: N/A;   IR ANGIO INTRA EXTRACRAN SEL INTERNAL CAROTID UNI L MOD SED  07/15/2019   IR ANGIO VERTEBRAL SEL SUBCLAVIAN INNOMINATE UNI R MOD SED  07/15/2019   IR CT HEAD LTD  07/15/2019   IR INTRAVSC STENT CERV CAROTID W/O EMB-PROT MOD SED INC ANGIO  07/15/2019   IR PERCUTANEOUS ART THROMBECTOMY/INFUSION INTRACRANIAL INC DIAG ANGIO  07/15/2019   LAPAROSCOPIC GASTRIC BANDING     LAPAROSCOPIC GASTRIC RESTRICTIVE DUODENAL PROCEDURE (DUODENAL SWITCH) N/A 01/25/2016   Procedure: LAPAROSCOPIC GASTRIC RESTRICTIVE DUODENAL PROCEDURE (DUODENAL SWITCH);  Surgeon: Ladora Daniel, MD;  Location: [facility] ORS;  Service: General;  Laterality: N/A;   OVARY SURGERY     x2   RADIOLOGY WITH ANESTHESIA N/A 07/15/2019   Procedure: IR WITH ANESTHESIA;  Surgeon: Luanne Bras, MD;  Location: [place];  Service: Radiology;  Laterality: N/A;   TONSILLECTOMY     TUBAL LIGATION     UMBILICAL HERNIA REPAIR N/A 01/25/2016   Procedure: LAPAROSCOPIC UMBILICAL HERNIA;  Surgeon: Legrand Como  Jed Limerick, MD;  Location: [facility] ORS;  Service: General;  Laterality: N/A;   UMBILICAL HERNIA REPAIR N/A 10/20/2016   Procedure: HERNIA REPAIR UMBILICAL ADULT;  Surgeon: Leonie Green, MD;  Location: [facility] ORS;  Service: General;  Laterality: N/A;    There were no vitals filed for this visit.   Subjective Assessment - 02/22/21 1347     Subjective Patient reports her neck pain and stiffness have resolved. She is still having numbness in right hand/fingers today. She reports its not in her 5th digit, just 1-4th digits;    Patient is accompained by: Family member   husband, Ray   Pertinent History 50 year old female with history of CVA on 07/15/2019 with left sided weakness. Patient has  PMH significant for DM, GERD, HTN, Asthma, Herpes, Costochondritis, Vit B12 deficiency. Patient did get repeat MRI with Dr. Manuella Ghazi with consistent bulging disk at C5-C6 with right side radiculopathy;    Limitations Lifting;Standing;Walking    How long can you sit comfortably? no restrictions    How long can you stand comfortably? Limited with prolonged standing - around 30 min    How long can you walk comfortably? around 30 min    Patient Stated Goals I want to improve my strength and increase my stability with standing/walking    Currently in Pain? No/denies    Pain Onset Other (comment)   Chronic since 06/2019   Multiple Pain Sites No                [facility] PT Assessment - 02/22/21 0001       Observation/Other Assessments   Focus on Therapeutic Outcomes (FOTO)  57%      AROM   Cervical - Right Rotation 65    Cervical - Left Rotation 65      Standardized Balance Assessment   Standardized Balance Assessment Dynamic Gait Index;Timed Up and Go Test      Dynamic Gait Index   Level Surface Normal    Change in Gait Speed Mild Impairment    Gait with Horizontal Head Turns Mild Impairment    Gait with Vertical Head Turns Normal    Gait and Pivot Turn Mild Impairment    Step Over Obstacle Normal    Step Around Obstacles Normal    Steps Mild Impairment    Total Score 20      Timed Up and Go Test   Normal TUG (seconds) 9.37           TREATMENT:   PT instructed patient in outcome measures to address goals, see above  PT educated patient in home cervical traction unit with proper positioning and instruction on how to use hand pump. Patient able to pump traction unit to 15# supervision 15 sec hold, 10 sec rest x10 min; Patient reports significant reduction in RUE hand numbness with cervical traction  Following traction upon sitting, she reports slight return of numbness but states that her hand still feels better than when she first came into therapy;  Following traction, PT  applied walkaide to LLE lower leg for ankle DF/toe extension activation;  Instructed patient in gait x160 feet x2 laps with good ankle DF/toe extension noted  Instructed patient in weaving around cones #5 x2 sets with increased toe curling noted due to increased challenge and increased flexor tone when stressed.  Patient tolerated session well. She would benefit from additional skilled PT Intervention to address toe curling and improve gait mechanics. Also plan to continue to work on  cervical spine to alleviate right hand numbness;                     PT Education - 02/22/21 1328     Education Details manual therapy/postural re-education/progress towards goals;    Person(s) Educated Patient    Methods Explanation;Verbal cues    Comprehension Need further instruction;Verbalized understanding;Returned demonstration;Verbal cues required              PT Short Term Goals - 02/22/21 1329       PT SHORT TERM GOAL #1   Title Pt will be independent with HEP in order to improve strength and balance in order to decrease fall risk and improve function at home and work.    Baseline 11/22/2020- Patient has no formal HEP in place. 7/5: doing some daily but not all    Time 4    Period Weeks    Status Partially Met    Target Date 03/22/21      PT SHORT TERM GOAL #2   Title Patient will increase cervical AROM rotation >60 degrees bilaterally for increased functional ROM for turning head when driving;    Baseline 7/5: 65    Time 4    Period Weeks    Status Achieved    Target Date 03/22/21               PT Long Term Goals - 02/22/21 1329       PT LONG TERM GOAL #1   Title Pt will improve DGI by at least 3 points in order to demonstrate clinically significant improvement in balance and decreased risk for falls.    Baseline 11/22/2020= DGI score= 15/24, 7/5: 20/24    Time 4    Period Weeks    Status Achieved    Target Date 03/22/21      PT LONG TERM GOAL #2   Title  Pt will report reduction in numbness in right hand by 50% to improve tolerance with ADLs and increase safety within home    Baseline 5/11- numbness in RUE hand is constant, 7/5: 10-20% improvement reported;    Time 4    Period Weeks    Status Partially Met    Target Date 03/22/21      PT LONG TERM GOAL #3   Title Pt will decrease TUG to below 11 seconds/decrease in order to demonstrate decreased fall risk.    Baseline 11/22/2020= 13.34 sec without an AD, 7/5: 9.37 sec without AD    Time 4    Period Weeks    Status Achieved    Target Date 03/22/21      PT LONG TERM GOAL #4   Title Patient will increase FOTO score to equal to or greater than 66  to demonstrate statistically significant improvement in mobility and quality of life.    Baseline 11/22/2020= FOTO= 62, 7/5: 57%    Time 4    Period Weeks    Status On-going    Target Date 03/22/21      PT LONG TERM GOAL #5   Title Patient will report a worst pain of 3/10 on VAS (neck/RUE)  to improve tolerance with ADLs and reduced symptoms with activities.    Baseline 5/11- reports variable pain in neck/RUE, 7/5: 2/10 worst pain in last week;    Time 4    Period Weeks    Status Achieved    Target Date 03/22/21  Plan - 02/22/21 1407     Clinical Impression Statement Patient motivated and participated well within session. She is progressing well with skilled intervention with resolution of neck pain/stiffness. She exhibits improved cervical ROM and better mobility. Patient does continue to have numbness in right hand. She is scheduled for NCV testing next week. Patient does respond well to cervical traction with temporary relief of numbness in hand. Will reach out to MD for order for home traction unit for symptom management. Patient continues to have increased flexor tone in left foot which does affect ambulation. This is improved with walkaide/NMES. Patient would benefit from skilled PT Intervention to improve gait  ability/mechanics and reduce numbness in right hand.    Personal Factors and Comorbidities Comorbidity 2    Comorbidities Diabetes, CVA    Examination-Activity Limitations Carry;Lift;Transfers    Examination-Participation Restrictions Occupation;Cleaning;Community Activity;Shop;Yard Work    Stability/Clinical Decision Making Stable/Uncomplicated    Rehab Potential Good    PT Frequency 1x / week    PT Duration 8 weeks    PT Treatment/Interventions ADLs/Self Care Home Management;Biofeedback;Electrical Stimulation;Moist Heat;DME Instruction;Gait training;Stair training;Functional mobility training;Therapeutic exercise;Balance training;Neuromuscular re-education;Therapeutic activities;Patient/family education;Manual techniques;Passive range of motion;Dry needling;Traction    PT Next Visit Plan continue with walkaide, work on cervical ROM/postural strengthening    PT Home Exercise Plan To be initiated next 1-2 visits.    Consulted and Agree with Plan of Care Patient             Patient will benefit from skilled therapeutic intervention in order to improve the following deficits and impairments:  Abnormal gait, Decreased activity tolerance, Decreased coordination, Decreased endurance, Decreased mobility, Decreased strength, Impaired tone, Impaired UE functional use, Pain, Hypomobility, Decreased range of motion, Postural dysfunction  Visit Diagnosis: Muscle weakness (generalized)  Other lack of coordination  Difficulty in walking, not elsewhere classified  Other abnormalities of gait and mobility  Cervicalgia  Abnormal posture     Problem List Patient Active Problem List   Diagnosis Date Noted   E. coli UTI    Diabetes mellitus type 2 in obese [facility])    Chronic pain of right knee    Flaccid monoplegia of upper extremity (HCC)    Hemiparesis affecting left side as late effect of stroke (HCC)    Acute ischemic right middle cerebral artery (MCA) stroke (HCC) 07/22/2019   Carotid  artery dissection  (HCC) s/p stent placement 07/21/2019   Diabetes mellitus type II, uncontrolled ([place]) 07/21/2019   Acute blood loss anemia 07/21/2019   Leukocytosis 07/21/2019   Stroke (cerebrum) (HCC) - R MCA infarct s/p tenecteplase and mechanical thrombectomy w/ d/t ICA dissection  07/15/2019   Middle cerebral artery embolism, right 07/15/2019   Controlled type 2 diabetes mellitus without complication, without long-term current use of insulin ([place]) 05/28/2017   Attention deficit hyperactivity disorder (ADHD), combined type 04/18/2017   Morbid obesity ([place]) 68/25/7493   Umbilical hernia 55/21/7471   Hiatal hernia 12/08/2015   History of Clostridium difficile colitis 06/19/2014   HTN (hypertension) 12/23/2013   PCOS (polycystic ovarian syndrome) 12/23/2013   Bariatric surgery status 01/28/2013    Fahim Kats PT, DPT 02/22/2021, 2:41 PM  [facility] [facility] [address] [place], [place], [address] Phone: [phone]   Fax:  [phone]  Name: Kathryn Coffey MRN: [id number] Date of Birth: 1970/11/01

## 2021-02-22 NOTE — Therapy (Signed)
[place] MAIN [facility] SERVICES [address] [place], [place], [address] Phone: [phone]   Fax:  [phone]  Occupational Therapy Treatment  Patient Details  Name: Kathryn Coffey MRN: [id number] Date of Birth: 1971-05-14 No data recorded  Encounter Date: 02/22/2021   OT End of Session - 02/22/21 1329     Visit Number 7    Number of Visits 25    Date for OT Re-Evaluation 05/11/21    Authorization Type Progress report period starting  on 12/06/20    Authorization Time Period FOTO    OT Start Time 1300    OT Stop Time 1345    OT Time Calculation (min) 45 min    Activity Tolerance Patient tolerated treatment well    Behavior During Therapy [facility] for tasks assessed/performed             Past Medical History:  Diagnosis Date   Abdominal wall cellulitis 2013 and 2014   Asthma    allergic to grasses   B12 deficiency    Complication of anesthesia    Costochondritis    Diabetes mellitus without complication ([place])    Gastric anomaly    multiple small ulcers   GERD (gastroesophageal reflux disease)    Herpes 06/2018   POSSIBLY IN EYE- PT TAKING VALTREX AND WILL SEE OPTHAMOLOGIST TO SEE IF VALTREX IS WORKING   History of abnormal cervical Pap smear    History of Clostridium difficile colitis    History of hiatal hernia    History of kidney stones    Hypertension    IBS (irritable bowel syndrome)    Migraine    Morbid obesity ([place])    s/p attempted gastric banding now decompressed   PCOS (polycystic ovarian syndrome)    PONV (postoperative nausea and vomiting)    WITH SPINAL ONLY   Stroke [facility])     Past Surgical History:  Procedure Laterality Date   BREAST EXCISIONAL BIOPSY Left    cyst excision - Dr. Patty Sermons   CESAREAN SECTION     CHOLECYSTECTOMY     COLONOSCOPY     EAR CYST EXCISION Right 07/04/2018   Procedure: EXCISION GLOMUS TUMOR THUMBNAIL;  Surgeon: Corky Mull, MD;  Location: [facility] ORS;  Service: Orthopedics;   Laterality: Right;   ESOPHAGOGASTRODUODENOSCOPY     ESOPHAGOGASTRODUODENOSCOPY (EGD) WITH PROPOFOL N/A 12/06/2015   Procedure: ESOPHAGOGASTRODUODENOSCOPY (EGD) WITH PROPOFOL;  Surgeon: Manya Silvas, MD;  Location: [facility] ENDOSCOPY;  Service: Endoscopy;  Laterality: N/A;   IR ANGIO INTRA EXTRACRAN SEL INTERNAL CAROTID UNI L MOD SED  07/15/2019   IR ANGIO VERTEBRAL SEL SUBCLAVIAN INNOMINATE UNI R MOD SED  07/15/2019   IR CT HEAD LTD  07/15/2019   IR INTRAVSC STENT CERV CAROTID W/O EMB-PROT MOD SED INC ANGIO  07/15/2019   IR PERCUTANEOUS ART THROMBECTOMY/INFUSION INTRACRANIAL INC DIAG ANGIO  07/15/2019   LAPAROSCOPIC GASTRIC BANDING     LAPAROSCOPIC GASTRIC RESTRICTIVE DUODENAL PROCEDURE (DUODENAL SWITCH) N/A 01/25/2016   Procedure: LAPAROSCOPIC GASTRIC RESTRICTIVE DUODENAL PROCEDURE (DUODENAL SWITCH);  Surgeon: Ladora Daniel, MD;  Location: [facility] ORS;  Service: General;  Laterality: N/A;   OVARY SURGERY     x2   RADIOLOGY WITH ANESTHESIA N/A 07/15/2019   Procedure: IR WITH ANESTHESIA;  Surgeon: Luanne Bras, MD;  Location: [place];  Service: Radiology;  Laterality: N/A;   TONSILLECTOMY     TUBAL LIGATION     UMBILICAL HERNIA REPAIR N/A 01/25/2016   Procedure: LAPAROSCOPIC UMBILICAL HERNIA;  Surgeon: Ladora Daniel, MD;  Location: [facility] ORS;  Service: General;  Laterality: N/A;   UMBILICAL HERNIA REPAIR N/A 10/20/2016   Procedure: HERNIA REPAIR UMBILICAL ADULT;  Surgeon: Leonie Green, MD;  Location: [facility] ORS;  Service: General;  Laterality: N/A;    There were no vitals filed for this visit.   Subjective Assessment - 02/22/21 1328     Subjective  Pt reports she is still working 4 hours a day, early morning shift.  She reports decreased concentration, attention and focus as the day goes on.    Patient is accompanied by: Family member    Pertinent History 50 year old female with history of CVA on 07/15/2019 with left sided weakness. Pt previously completed inpatient rehab and  outpatient therapy services. Patient has PMH significant for DM, GERD, HTN, Asthma, Herpes, Costochondritis, Vit B12 deficiency.    Limitations LUE AROM, [place]    Patient Stated Goals Patient would like to be as independent as possible, return to her prior level of function    Currently in Pain? Yes            OT TREATMENT    Neuro muscular re-education:  Pt. performed [facility] tasks using the Grooved pegboard. Pt. worked on grasping the grooved pegs from a horizontal position, and moving the pegs to a vertical position in the hand to prepare for placing them in the grooved slot. Pt. removed the pegs while alternating thumb opposition to the tip of her 2nd through 4th digit.  Pt. worked on grasping and storing the 1" grooved pegs in the palm of her hand.   Therapeutic Exercise:  Pt. worked on nerve glides/intrinsic stretches with the left hand. Pt. required visual, verbal, and tactile cues.  Selfcare:  Pt. worked on [employer] while incorporating her left hand during the task. Pt.'s typing speed was 10 wpm with 99% accuracy.   Pt. reports that her Long Term Disability was denied. Pt. reports that she appealed it. Pt. continues to work on improving LUE strength, and [facility] skills.  Pt. reports that her left hand is tired today. Pt. required proximal support of the LUE when attempting to use her left hand during the treatment tasks. Pt. continues to work on improving LUE hand function, and [facility] skills in order to improve ADL, and IADL functioning.                       OT Education - 02/22/21 1329     Education Details goals, plan of care, exercises    Person(s) Educated Patient    Methods Explanation;Demonstration;Handout    Comprehension Verbalized understanding;Returned demonstration              OT Short Term Goals - 02/16/21 1329       OT SHORT TERM GOAL #1   Title Pt will independently complete HEP.    Baseline eval: pt completes mirror box therapy and [place]  exercises    Time 6    Period Weeks    Status On-going    Target Date 05/11/21      OT SHORT TERM GOAL #2   Title Pt will cut meat with MOD I utilizing adaptive equipment as needed    Baseline Eval: uses good grips, requires MIN A, 6th visit: continued use of larger grip utensils, occasional assist with cutting    Time 6    Period Weeks    Status On-going    Target Date 03/30/21  OT Long Term Goals - 02/16/21 1320       OT LONG TERM GOAL #1   Title Pt will increase left shoulder flexion AROM by 20 degrees to complete hair washing/brushing with LUE.    Baseline Eval: seated shoulder flexion AROM 125*, 6th visit: left shoulder flexion 134, difficulty reaching to manage hair    Time 12    Period Weeks    Status On-going    Target Date 05/11/21      OT LONG TERM GOAL #2   Title Pt will increase left grip by 15# to assist with picking up objects and opening cans.    Baseline Eval: L grip 25#, 6th visit: 22#, continued difficulty with picking up items with left hand securely    Time 12    Period Weeks    Status On-going    Target Date 05/11/21      OT LONG TERM GOAL #3   Title Pt will Independently don/doff bra in sitting    Baseline Eval: unable to hook/unhook, 6th visit:  can snap and pull over but cannot hook when on body    Time 12    Period Weeks    Status On-going    Target Date 05/11/21      OT LONG TERM GOAL #4   Title Pt will improve L hand PEG time to less than 1 min to improve [place] and manipulate small objects during I/ADLs including folding laundry.    Baseline Eval: PEG L 3 min 59 sec, 6th visit: 1 min 1 sec still has difficulty with folding laundry neatly.    Time 12    Period Weeks    Status Revised    Target Date 05/11/21      OT LONG TERM GOAL #5   Title Pt will improve FOTO score 49 or greater to show clinically relevant change to engage in ADL and IADL tasks with greater independence    Baseline Eval: FOTO 45, 6ht visit: 87    Time  12    Period Weeks    Status On-going    Target Date 05/12/21      Long Term Additional Goals   Additional Long Term Goals Yes      OT LONG TERM GOAL #6   Title Pt to demonstrate improved score on typing proficiency test greater than 20 WPM    Baseline avg score is currently 10-12 WPM    Time 12    Period Weeks    Status New    Target Date 05/11/21      OT LONG TERM GOAL #7   Title Pt will demonstrate knowledge of strategies to manage bill paying with no late payments in a month.    Baseline has had several late payments recently    Time 6    Period Weeks    Status New    Target Date 03/30/21      OT LONG TERM GOAL #8   Title Pt will complete laundry with modified independence, folding with precision.    Baseline messy folding, lacking precision    Time 12    Period Weeks    Status New    Target Date 05/11/21      OT LONG TERM GOAL  #9   TITLE Pt will improve strength in left UE to manage all grocery bags from the cart to the car independently.    Baseline unable to manage heavier items    Time 12    Period  Weeks    Status New    Target Date 05/11/21                   Plan - 02/22/21 1330     Clinical Impression Statement Pt. reports that her Long Term Disability was denied. Pt. reports that she appealed it. Pt. continues to work on improving LUE strength, and [facility] skills.  Pt. reports that her left hand is tired today. Pt. required proximal support of the LUE when attempting to use her left hand during the treatment tasks. Pt. continues to work on improving LUE hand function, and [facility] skills in order to improve ADL, and IADL functioning.      OT Occupational Profile and History Detailed Assessment- Review of Records and additional review of physical, cognitive, psychosocial history related to current functional performance    Occupational performance deficits (Please refer to evaluation for details): ADL's;IADL's    Rehab Potential Good    Clinical Decision  Making Several treatment options, min-mod task modification necessary    Comorbidities Affecting Occupational Performance: May have comorbidities impacting occupational performance    Modification or Assistance to Complete Evaluation  Min-Moderate modification of tasks or assist with assess necessary to complete eval    OT Frequency 2x / week    OT Duration 12 weeks    OT Treatment/Interventions Self-care/ADL training;Moist Heat;DME and/or AE instruction;Neuromuscular education;Therapeutic exercise;Therapeutic activities;Energy conservation;Patient/family education    Consulted and Agree with Plan of Care Patient             Patient will benefit from skilled therapeutic intervention in order to improve the following deficits and impairments:           Visit Diagnosis: Muscle weakness (generalized)  Other lack of coordination    Problem List Patient Active Problem List   Diagnosis Date Noted   E. coli UTI    Diabetes mellitus type 2 in obese (H[facility])    Chronic pain of right knee    Flaccid monoplegia of upper extremity (HCC)    Hemiparesis affecting left side as late effect of stroke ([place])    Acute ischemic right middle cerebral artery (MCA) stroke ([place]) 07/22/2019   Carotid artery dissection  ([place]) s/p stent placement 07/21/2019   Diabetes mellitus type II, uncontrolled ([place]) 07/21/2019   Acute blood loss anemia 07/21/2019   Leukocytosis 07/21/2019   Stroke (cerebrum) ([place]) - R MCA infarct s/p tenecteplase and mechanical thrombectomy w/ d/t ICA dissection  07/15/2019   Middle cerebral artery embolism, right 07/15/2019   Controlled type 2 diabetes mellitus without complication, without long-term current use of insulin ([place]) 05/28/2017   Attention deficit hyperactivity disorder (ADHD), combined type 04/18/2017   Morbid obesity ([place]) 93/06/2161   Umbilical hernia 44/69/5072   Hiatal hernia 12/08/2015   History of Clostridium difficile colitis 06/19/2014   HTN (hypertension)  12/23/2013   PCOS (polycystic ovarian syndrome) 12/23/2013   Bariatric surgery status 01/28/2013    Harrel Carina, MS, OTR/L 02/22/2021, 1:35 PM  [place] MAIN [facility] SERVICES [address] [place], [place], [address] Phone: [phone]   Fax:  [phone]  Name: Kathryn Coffey MRN: [id number] Date of Birth: 1971/02/27

## 2021-02-24 ENCOUNTER — Encounter: Payer: No Typology Code available for payment source | Admitting: Occupational Therapy

## 2021-02-24 ENCOUNTER — Ambulatory Visit: Payer: No Typology Code available for payment source | Admitting: Physical Therapy

## 2021-02-25 ENCOUNTER — Other Ambulatory Visit (HOSPITAL_COMMUNITY): Payer: Self-pay

## 2021-03-01 ENCOUNTER — Ambulatory Visit: Payer: No Typology Code available for payment source

## 2021-03-01 ENCOUNTER — Ambulatory Visit: Payer: No Typology Code available for payment source | Admitting: Occupational Therapy

## 2021-03-01 ENCOUNTER — Other Ambulatory Visit: Payer: Self-pay

## 2021-03-01 DIAGNOSIS — R278 Other lack of coordination: Secondary | ICD-10-CM

## 2021-03-01 DIAGNOSIS — R262 Difficulty in walking, not elsewhere classified: Secondary | ICD-10-CM

## 2021-03-01 DIAGNOSIS — M6281 Muscle weakness (generalized): Secondary | ICD-10-CM

## 2021-03-01 DIAGNOSIS — R2689 Other abnormalities of gait and mobility: Secondary | ICD-10-CM

## 2021-03-01 DIAGNOSIS — R293 Abnormal posture: Secondary | ICD-10-CM

## 2021-03-01 DIAGNOSIS — M542 Cervicalgia: Secondary | ICD-10-CM

## 2021-03-01 DIAGNOSIS — R269 Unspecified abnormalities of gait and mobility: Secondary | ICD-10-CM

## 2021-03-01 NOTE — Therapy (Signed)
[place] MAIN [facility] SERVICES [address] [place], [place], [address] Phone: [phone]   Fax:  [phone]  Physical Therapy Treatment  Patient Details  Name: Kathryn Coffey MRN: [id number] Date of Birth: Jan 20, 1971 Referring Provider (PT): shah   Encounter Date: 03/01/2021   PT End of Session - 03/01/21 1524     Visit Number 14    Number of Visits 19    Date for PT Re-Evaluation 03/22/21    Authorization Type [employer]    Authorization Time Period Cert 7/5-4/4; 9/20-1/0; 4/4-6/27    PT Start Time 1430    PT Stop Time 1515    PT Time Calculation (min) 45 min    Activity Tolerance Patient tolerated treatment well;No increased pain    Behavior During Therapy [facility] for tasks assessed/performed             Past Medical History:  Diagnosis Date   Abdominal wall cellulitis 2013 and 2014   Asthma    allergic to grasses   B12 deficiency    Complication of anesthesia    Costochondritis    Diabetes mellitus without complication (H[facility])    Gastric anomaly    multiple small ulcers   GERD (gastroesophageal reflux disease)    Herpes 06/2018   POSSIBLY IN EYE- PT TAKING VALTREX AND WILL SEE OPTHAMOLOGIST TO SEE IF VALTREX IS WORKING   History of abnormal cervical Pap smear    History of Clostridium difficile colitis    History of hiatal hernia    History of kidney stones    Hypertension    IBS (irritable bowel syndrome)    Migraine    Morbid obesity ([place])    s/p attempted gastric banding now decompressed   PCOS (polycystic ovarian syndrome)    PONV (postoperative nausea and vomiting)    WITH SPINAL ONLY   Stroke [facility])     Past Surgical History:  Procedure Laterality Date   BREAST EXCISIONAL BIOPSY Left    cyst excision - Dr. Patty Sermons   CESAREAN SECTION     CHOLECYSTECTOMY     COLONOSCOPY     EAR CYST EXCISION Right 07/04/2018   Procedure: EXCISION GLOMUS TUMOR THUMBNAIL;  Surgeon: Corky Mull, MD;  Location:  [facility] ORS;  Service: Orthopedics;  Laterality: Right;   ESOPHAGOGASTRODUODENOSCOPY     ESOPHAGOGASTRODUODENOSCOPY (EGD) WITH PROPOFOL N/A 12/06/2015   Procedure: ESOPHAGOGASTRODUODENOSCOPY (EGD) WITH PROPOFOL;  Surgeon: Manya Silvas, MD;  Location: [facility] ENDOSCOPY;  Service: Endoscopy;  Laterality: N/A;   IR ANGIO INTRA EXTRACRAN SEL INTERNAL CAROTID UNI L MOD SED  07/15/2019   IR ANGIO VERTEBRAL SEL SUBCLAVIAN INNOMINATE UNI R MOD SED  07/15/2019   IR CT HEAD LTD  07/15/2019   IR INTRAVSC STENT CERV CAROTID W/O EMB-PROT MOD SED INC ANGIO  07/15/2019   IR PERCUTANEOUS ART THROMBECTOMY/INFUSION INTRACRANIAL INC DIAG ANGIO  07/15/2019   LAPAROSCOPIC GASTRIC BANDING     LAPAROSCOPIC GASTRIC RESTRICTIVE DUODENAL PROCEDURE (DUODENAL SWITCH) N/A 01/25/2016   Procedure: LAPAROSCOPIC GASTRIC RESTRICTIVE DUODENAL PROCEDURE (DUODENAL SWITCH);  Surgeon: Ladora Daniel, MD;  Location: [facility] ORS;  Service: General;  Laterality: N/A;   OVARY SURGERY     x2   RADIOLOGY WITH ANESTHESIA N/A 07/15/2019   Procedure: IR WITH ANESTHESIA;  Surgeon: Luanne Bras, MD;  Location: [place];  Service: Radiology;  Laterality: N/A;   TONSILLECTOMY     TUBAL LIGATION     UMBILICAL HERNIA REPAIR N/A 01/25/2016   Procedure: LAPAROSCOPIC  UMBILICAL HERNIA;  Surgeon: Ladora Daniel, MD;  Location: [facility] ORS;  Service: General;  Laterality: N/A;   UMBILICAL HERNIA REPAIR N/A 10/20/2016   Procedure: HERNIA REPAIR UMBILICAL ADULT;  Surgeon: Leonie Green, MD;  Location: [facility] ORS;  Service: General;  Laterality: N/A;    There were no vitals filed for this visit.   Subjective Assessment - 03/01/21 1432     Subjective Pt doing well in general, continues to gain 2-3 days relief in toe curling after use of walkaid. HEP going well. No pain, no meds updates today. Pt reports going for nerve conduction study with Dr. Manuella Ghazi, reports results showing Rt sided carpal tunnel syndrome, Manuella Ghazi recommending carpal tunnel injections in the  coming weeks.    Pertinent History 50 year old female with history of CVA on 07/15/2019 with left sided weakness. Patient has PMH significant for DM, GERD, HTN, Asthma, Herpes, Costochondritis, Vit B12 deficiency. Patient did get repeat MRI with Dr. Manuella Ghazi with consistent bulging disk at C5-C6 with right side radiculopathy;            Intervention this date:  *setup with walkaide unsuccessful despite several attempts *application of sensory/motor continous TENS to anterior compartment tib anterior  -Ankle DF assisted AMB with TENS x 4 laps clockwise to promote pronation use of Left peroneals -standing LLE toe cone toe taps 1x15 -standing bilat all toe extension isometrics 1x15x3secH   *adjusted electrodes to better target extensor hallucis longus -seated ankle DF 2x15 c 2lb AW hallux extensior, concurrent TENS to extensor hallucis longus        PT Short Term Goals - 02/22/21 1329       PT SHORT TERM GOAL #1   Title Pt will be independent with HEP in order to improve strength and balance in order to decrease fall risk and improve function at home and work.    Baseline 11/22/2020- Patient has no formal HEP in place. 7/5: doing some daily but not all    Time 4    Period Weeks    Status Partially Met    Target Date 03/22/21      PT SHORT TERM GOAL #2   Title Patient will increase cervical AROM rotation >60 degrees bilaterally for increased functional ROM for turning head when driving;    Baseline 7/5: 65    Time 4    Period Weeks    Status Achieved    Target Date 03/22/21               PT Long Term Goals - 02/22/21 1329       PT LONG TERM GOAL #1   Title Pt will improve DGI by at least 3 points in order to demonstrate clinically significant improvement in balance and decreased risk for falls.    Baseline 11/22/2020= DGI score= 15/24, 7/5: 20/24    Time 4    Period Weeks    Status Achieved    Target Date 03/22/21      PT LONG TERM GOAL #2   Title Pt will report  reduction in numbness in right hand by 50% to improve tolerance with ADLs and increase safety within home    Baseline 5/11- numbness in RUE hand is constant, 7/5: 10-20% improvement reported;    Time 4    Period Weeks    Status Partially Met    Target Date 03/22/21      PT LONG TERM GOAL #3   Title Pt will decrease TUG to below 11  seconds/decrease in order to demonstrate decreased fall risk.    Baseline 11/22/2020= 13.34 sec without an AD, 7/5: 9.37 sec without AD    Time 4    Period Weeks    Status Achieved    Target Date 03/22/21      PT LONG TERM GOAL #4   Title Patient will increase FOTO score to equal to or greater than 66  to demonstrate statistically significant improvement in mobility and quality of life.    Baseline 11/22/2020= FOTO= 62, 7/5: 57%    Time 4    Period Weeks    Status On-going    Target Date 03/22/21      PT LONG TERM GOAL #5   Title Patient will report a worst pain of 3/10 on VAS (neck/RUE)  to improve tolerance with ADLs and reduced symptoms with activities.    Baseline 5/11- reports variable pain in neck/RUE, 7/5: 2/10 worst pain in last week;    Time 4    Period Weeks    Status Achieved    Target Date 03/22/21                   Plan - 03/01/21 1525     Clinical Impression Statement Discussed pt's recent NCS results perfomred today with neurology. Pt asked to continue working with [facility] assistance to improve heel strike in gait, hallux dorsiflexion component of ankle DF. Pt conitnues to show 2-3 day improvement in gross motor and fine motor control of the left ankle and foot, particularly in avoiding automated long toes flexion crampingduring gait. Author unable to get walkAide to interface correctly with pt's leg, unclear technical difficulty, but pt is agreeable to assist from basic TENS unit, continuous signal, strength to tolerance. Pt does not improved motor activation assist from unit, both in gait, as well as [place] and CKC exercises. Pt  continues to struggle with consistent gait patterns outside of heavy concentration, easily reverting to flat foot strike when distracted. Pt will continue to benefit from skilled PT intervention to maximize safety, independence in basic mobility required for ADL/IADL. Pt conitnues to make progress toward LT goals in general.    Personal Factors and Comorbidities Comorbidity 2    Comorbidities Diabetes, CVA    Examination-Activity Limitations Carry;Lift;Transfers    Examination-Participation Restrictions Occupation;Cleaning;Community Activity;Shop;Yard Work    [employer] Stable/Uncomplicated    Clinical Decision Making High    Rehab Potential Good    PT Frequency 1x / week    PT Duration 8 weeks    PT Treatment/Interventions ADLs/Self Care Home Management;Biofeedback;Electrical Stimulation;Moist Heat;DME Instruction;Gait training;Stair training;Functional mobility training;Therapeutic exercise;Balance training;Neuromuscular re-education;Therapeutic activities;Patient/family education;Manual techniques;Passive range of motion;Dry needling;Traction    PT Next Visit Plan continue with walkaide, work on cervical ROM/postural strengthening    PT Home Exercise Plan No updates today, t reports to be performing with active assistance via strap.    Consulted and Agree with Plan of Care Patient             Patient will benefit from skilled therapeutic intervention in order to improve the following deficits and impairments:  Abnormal gait, Decreased activity tolerance, Decreased coordination, Decreased endurance, Decreased mobility, Decreased strength, Impaired tone, Impaired UE functional use, Pain, Hypomobility, Decreased range of motion, Postural dysfunction  Visit Diagnosis: Muscle weakness (generalized)  Other lack of coordination  Difficulty in walking, not elsewhere classified  Other abnormalities of gait and mobility  Cervicalgia  Abnormal posture  Abnormality  of gait and mobility  Problem List Patient Active Problem List   Diagnosis Date Noted   E. coli UTI    Diabetes mellitus type 2 in obese [facility])    Chronic pain of right knee    Flaccid monoplegia of upper extremity (HCC)    Hemiparesis affecting left side as late effect of stroke ([place])    Acute ischemic right middle cerebral artery (MCA) stroke ([place]) 07/22/2019   Carotid artery dissection  ([place]) s/p stent placement 07/21/2019   Diabetes mellitus type II, uncontrolled ([place]) 07/21/2019   Acute blood loss anemia 07/21/2019   Leukocytosis 07/21/2019   Stroke (cerebrum) ([place]) - R MCA infarct s/p tenecteplase and mechanical thrombectomy w/ d/t ICA dissection  07/15/2019   Middle cerebral artery embolism, right 07/15/2019   Controlled type 2 diabetes mellitus without complication, without long-term current use of insulin ([place]) 05/28/2017   Attention deficit hyperactivity disorder (ADHD), combined type 04/18/2017   Morbid obesity ([place]) 90/22/8406   Umbilical hernia 98/61/4830   Hiatal hernia 12/08/2015   History of Clostridium difficile colitis 06/19/2014   HTN (hypertension) 12/23/2013   PCOS (polycystic ovarian syndrome) 12/23/2013   Bariatric surgery status 01/28/2013   3:36 PM, 03/01/21 Etta Grandchild, PT, DPT Physical Therapist - [facility]  Outpatient Physical Therapy- [place] [phone]     Advance C 03/01/2021, 3:33 PM  [place] MAIN [facility] SERVICES [address] [place], [place], [address] Phone: [phone]   Fax:  [phone]  Name: Kathryn Coffey MRN: [id number] Date of Birth: 05-06-1971

## 2021-03-03 ENCOUNTER — Encounter: Payer: No Typology Code available for payment source | Admitting: Occupational Therapy

## 2021-03-03 ENCOUNTER — Ambulatory Visit: Payer: No Typology Code available for payment source | Admitting: Physical Therapy

## 2021-03-03 ENCOUNTER — Other Ambulatory Visit: Payer: Self-pay

## 2021-03-03 MED ORDER — VYVANSE 50 MG PO CAPS
ORAL_CAPSULE | ORAL | 0 refills | Status: DC
  Filled 2021-03-03: qty 30, 30d supply, fill #0

## 2021-03-03 MED ORDER — CARESTART COVID-19 HOME TEST VI KIT
PACK | 0 refills | Status: DC
  Filled 2021-03-03: qty 2, 4d supply, fill #0

## 2021-03-04 ENCOUNTER — Other Ambulatory Visit: Payer: Self-pay

## 2021-03-04 MED ORDER — AMOXICILLIN-POT CLAVULANATE 500-125 MG PO TABS
ORAL_TABLET | ORAL | 0 refills | Status: DC
  Filled 2021-03-04: qty 21, 7d supply, fill #0

## 2021-03-08 ENCOUNTER — Ambulatory Visit: Payer: No Typology Code available for payment source | Admitting: Physical Therapy

## 2021-03-08 ENCOUNTER — Encounter: Payer: No Typology Code available for payment source | Admitting: Occupational Therapy

## 2021-03-10 ENCOUNTER — Encounter: Payer: No Typology Code available for payment source | Admitting: Occupational Therapy

## 2021-03-10 ENCOUNTER — Ambulatory Visit: Payer: No Typology Code available for payment source | Admitting: Physical Therapy

## 2021-03-11 ENCOUNTER — Encounter: Payer: Self-pay | Admitting: Occupational Therapy

## 2021-03-11 NOTE — Therapy (Signed)
[place] MAIN [facility] SERVICES [address] [place], [place], [address] Phone: [phone]   Fax:  [phone]  Occupational Therapy Treatment  Patient Details  Name: Kathryn Coffey MRN: [id number] Date of Birth: Oct 19, 1970 No data recorded  Encounter Date: 03/01/2021   OT End of Session - 03/11/21 1816     Visit Number 8    Number of Visits 25    Date for OT Re-Evaluation 05/11/21    Authorization Type Progress report period starting  on 12/06/20    Authorization Time Period FOTO    OT Start Time 1330    OT Stop Time 1415    OT Time Calculation (min) 45 min    Activity Tolerance Patient tolerated treatment well    Behavior During Therapy [facility] for tasks assessed/performed             Past Medical History:  Diagnosis Date   Abdominal wall cellulitis 2013 and 2014   Asthma    allergic to grasses   B12 deficiency    Complication of anesthesia    Costochondritis    Diabetes mellitus without complication ([place])    Gastric anomaly    multiple small ulcers   GERD (gastroesophageal reflux disease)    Herpes 06/2018   POSSIBLY IN EYE- PT TAKING VALTREX AND WILL SEE OPTHAMOLOGIST TO SEE IF VALTREX IS WORKING   History of abnormal cervical Pap smear    History of Clostridium difficile colitis    History of hiatal hernia    History of kidney stones    Hypertension    IBS (irritable bowel syndrome)    Migraine    Morbid obesity ([place])    s/p attempted gastric banding now decompressed   PCOS (polycystic ovarian syndrome)    PONV (postoperative nausea and vomiting)    WITH SPINAL ONLY   Stroke [facility])     Past Surgical History:  Procedure Laterality Date   BREAST EXCISIONAL BIOPSY Left    cyst excision - Dr. Patty Sermons   CESAREAN SECTION     CHOLECYSTECTOMY     COLONOSCOPY     EAR CYST EXCISION Right 07/04/2018   Procedure: EXCISION GLOMUS TUMOR THUMBNAIL;  Surgeon: Corky Mull, MD;  Location: [facility] ORS;  Service: Orthopedics;   Laterality: Right;   ESOPHAGOGASTRODUODENOSCOPY     ESOPHAGOGASTRODUODENOSCOPY (EGD) WITH PROPOFOL N/A 12/06/2015   Procedure: ESOPHAGOGASTRODUODENOSCOPY (EGD) WITH PROPOFOL;  Surgeon: Manya Silvas, MD;  Location: [facility] ENDOSCOPY;  Service: Endoscopy;  Laterality: N/A;   IR ANGIO INTRA EXTRACRAN SEL INTERNAL CAROTID UNI L MOD SED  07/15/2019   IR ANGIO VERTEBRAL SEL SUBCLAVIAN INNOMINATE UNI R MOD SED  07/15/2019   IR CT HEAD LTD  07/15/2019   IR INTRAVSC STENT CERV CAROTID W/O EMB-PROT MOD SED INC ANGIO  07/15/2019   IR PERCUTANEOUS ART THROMBECTOMY/INFUSION INTRACRANIAL INC DIAG ANGIO  07/15/2019   LAPAROSCOPIC GASTRIC BANDING     LAPAROSCOPIC GASTRIC RESTRICTIVE DUODENAL PROCEDURE (DUODENAL SWITCH) N/A 01/25/2016   Procedure: LAPAROSCOPIC GASTRIC RESTRICTIVE DUODENAL PROCEDURE (DUODENAL SWITCH);  Surgeon: Ladora Daniel, MD;  Location: [facility] ORS;  Service: General;  Laterality: N/A;   OVARY SURGERY     x2   RADIOLOGY WITH ANESTHESIA N/A 07/15/2019   Procedure: IR WITH ANESTHESIA;  Surgeon: Luanne Bras, MD;  Location: [place];  Service: Radiology;  Laterality: N/A;   TONSILLECTOMY     TUBAL LIGATION     UMBILICAL HERNIA REPAIR N/A 01/25/2016   Procedure: LAPAROSCOPIC UMBILICAL HERNIA;  Surgeon: Ladora Daniel, MD;  Location: [facility] ORS;  Service: General;  Laterality: N/A;   UMBILICAL HERNIA REPAIR N/A 10/20/2016   Procedure: HERNIA REPAIR UMBILICAL ADULT;  Surgeon: Leonie Green, MD;  Location: [facility] ORS;  Service: General;  Laterality: N/A;    There were no vitals filed for this visit.   Subjective Assessment - 03/11/21 1814     Subjective  Pt reports she is going on vacation with her family in [place] next week. Husband will do all the driving for the trip.    Patient is accompanied by: Family member    Pertinent History 50 year old female with history of CVA on 07/15/2019 with left sided weakness. Pt previously completed inpatient rehab and outpatient therapy services.  Patient has PMH significant for DM, GERD, HTN, Asthma, Herpes, Costochondritis, Vit B12 deficiency.    Limitations LUE AROM, [place]    Patient Stated Goals Patient would like to be as independent as possible, return to her prior level of function    Currently in Pain? Yes    Pain Score 1     Pain Location Arm    Pain Orientation Left    Pain Descriptors / Indicators Aching    Pain Type Chronic pain    Pain Frequency Intermittent             Pt trying to get ready for vacation with family next week in [place].  Patient seen for strengthening with use of resistive hand gripper with 6# for 20 reps for 2 sets left UE.  Cues for proper gripping pattern and hold of gripper to pick up and move items with use of sustained grip.   Multi directional reaching patterns with use of shape tower in standing, moving items from one level to the next for first 3 graduated levels.  Cues and occasional guiding for working towards producing normal movement patterns, tends to still use substitution of movements at the shoulder to compensate.  Multiple reps for each level of reach.  Neuro: Patient seen for manipulation of playing cards for flipping using thumb finger combinations.  Pt working towards flipping cards with thumb on top, fingers on bottom and then with fingers on top, thumb on the bottom, performed each motion for entire deck and then combined movements with alternating for whole deck.  Cues for thumb/finger patterns.    Encouraged pt to use hand as much as possible while on vacation and during daily functional tasks.   Response to tx: Patient requiring cues for proper gripping patterns, decreased strength on left for grip and for sustaining grip on objects. Pt still tends to substitute movements at the shoulder when reaching and trying to use the left arm.  Patient continues to respond well to therapeutic intervention, pain has decreased and motion with some improvements. Pt continues to benefit from  skilled OT services to decrease pain and improve functional use for necessary daily tasks at home, work and in the community.                      OT Education - 03/11/21 1816     Education Details HEP    Person(s) Educated Patient    Methods Explanation;Demonstration;Handout    Comprehension Verbalized understanding;Returned demonstration              OT Short Term Goals - 02/16/21 1329       OT SHORT TERM GOAL #1   Title Pt will independently complete HEP.  Baseline eval: pt completes mirror box therapy and Converse exercises    Time 6    Period Weeks    Status On-going    Target Date 05/11/21      OT SHORT TERM GOAL #2   Title Pt will cut meat with MOD I utilizing adaptive equipment as needed    Baseline Eval: uses good grips, requires MIN A, 6th visit: continued use of larger grip utensils, occasional assist with cutting    Time 6    Period Weeks    Status On-going    Target Date 03/30/21               OT Long Term Goals - 02/16/21 1320       OT LONG TERM GOAL #1   Title Pt will increase left shoulder flexion AROM by 20 degrees to complete hair washing/brushing with LUE.    Baseline Eval: seated shoulder flexion AROM 125*, 6th visit: left shoulder flexion 134, difficulty reaching to manage hair    Time 12    Period Weeks    Status On-going    Target Date 05/11/21      OT LONG TERM GOAL #2   Title Pt will increase left grip by 15# to assist with picking up objects and opening cans.    Baseline Eval: L grip 25#, 6th visit: 22#, continued difficulty with picking up items with left hand securely    Time 12    Period Weeks    Status On-going    Target Date 05/11/21      OT LONG TERM GOAL #3   Title Pt will Independently don/doff bra in sitting    Baseline Eval: unable to hook/unhook, 6th visit:  can snap and pull over but cannot hook when on body    Time 12    Period Weeks    Status On-going    Target Date 05/11/21      OT LONG TERM  GOAL #4   Title Pt will improve L hand PEG time to less than 1 min to improve [place] and manipulate small objects during I/ADLs including folding laundry.    Baseline Eval: PEG L 3 min 59 sec, 6th visit: 1 min 1 sec still has difficulty with folding laundry neatly.    Time 12    Period Weeks    Status Revised    Target Date 05/11/21      OT LONG TERM GOAL #5   Title Pt will improve FOTO score 49 or greater to show clinically relevant change to engage in ADL and IADL tasks with greater independence    Baseline Eval: FOTO 45, 6ht visit: 63    Time 12    Period Weeks    Status On-going    Target Date 05/12/21      Long Term Additional Goals   Additional Long Term Goals Yes      OT LONG TERM GOAL #6   Title Pt to demonstrate improved score on typing proficiency test greater than 20 WPM    Baseline avg score is currently 10-12 WPM    Time 12    Period Weeks    Status New    Target Date 05/11/21      OT LONG TERM GOAL #7   Title Pt will demonstrate knowledge of strategies to manage bill paying with no late payments in a month.    Baseline has had several late payments recently    Time 6    Period Weeks  Status New    Target Date 03/30/21      OT LONG TERM GOAL #8   Title Pt will complete laundry with modified independence, folding with precision.    Baseline messy folding, lacking precision    Time 12    Period Weeks    Status New    Target Date 05/11/21      OT LONG TERM GOAL  #9   TITLE Pt will improve strength in left UE to manage all grocery bags from the cart to the car independently.    Baseline unable to manage heavier items    Time 12    Period Weeks    Status New    Target Date 05/11/21                   Plan - 03/11/21 1816     Clinical Impression Statement Patient requiring cues for proper gripping patterns, decreased strength on left for grip and for sustaining grip on objects. Pt still tends to substitute movements at the shoulder when reaching  and trying to use the left arm.  Patient continues to respond well to therapeutic intervention, pain has decreased and motion with some improvements. Pt continues to benefit from skilled OT services to decrease pain and improve functional use for necessary daily tasks at home, work and in the community.    OT Occupational Profile and History Detailed Assessment- Review of Records and additional review of physical, cognitive, psychosocial history related to current functional performance    Occupational performance deficits (Please refer to evaluation for details): ADL's;IADL's    Rehab Potential Good    Clinical Decision Making Several treatment options, min-mod task modification necessary    Comorbidities Affecting Occupational Performance: May have comorbidities impacting occupational performance    Modification or Assistance to Complete Evaluation  Min-Moderate modification of tasks or assist with assess necessary to complete eval    OT Frequency 2x / week    OT Duration 12 weeks    OT Treatment/Interventions Self-care/ADL training;Moist Heat;DME and/or AE instruction;Neuromuscular education;Therapeutic exercise;Therapeutic activities;Energy conservation;Patient/family education    Consulted and Agree with Plan of Care Patient             Patient will benefit from skilled therapeutic intervention in order to improve the following deficits and impairments:           Visit Diagnosis: Muscle weakness (generalized)  Other lack of coordination  Difficulty in walking, not elsewhere classified    Problem List Patient Active Problem List   Diagnosis Date Noted   E. coli UTI    Diabetes mellitus type 2 in obese (HCC)    Chronic pain of right knee    Flaccid monoplegia of upper extremity (HCC)    Hemiparesis affecting left side as late effect of stroke ([place])    Acute ischemic right middle cerebral artery (MCA) stroke ([place]) 07/22/2019   Carotid artery dissection  ([place]) s/p stent  placement 07/21/2019   Diabetes mellitus type II, uncontrolled ([place]) 07/21/2019   Acute blood loss anemia 07/21/2019   Leukocytosis 07/21/2019   Stroke (cerebrum) ([place]) - R MCA infarct s/p tenecteplase and mechanical thrombectomy w/ d/t ICA dissection  07/15/2019   Middle cerebral artery embolism, right 07/15/2019   Controlled type 2 diabetes mellitus without complication, without long-term current use of insulin ([place]) 05/28/2017   Attention deficit hyperactivity disorder (ADHD), combined type 04/18/2017   Morbid obesity ([place]) 123456   Umbilical hernia 99991111   Hiatal hernia 12/08/2015  History of Clostridium difficile colitis 06/19/2014   HTN (hypertension) 12/23/2013   PCOS (polycystic ovarian syndrome) 12/23/2013   Bariatric surgery status 01/28/2013    Aubriauna Riner 03/11/2021, 6:29 PM  [place] MAIN [facility] SERVICES [address] [place], [place], [address] Phone: [phone]   Fax:  [phone]  Name: TEE NEVE MRN: [id number] Date of Birth: [birth year]

## 2021-03-14 ENCOUNTER — Other Ambulatory Visit: Payer: Self-pay

## 2021-03-14 MED FILL — Metformin HCl Tab 500 MG: ORAL | 90 days supply | Qty: 180 | Fill #0 | Status: AC

## 2021-03-14 MED FILL — Bupropion HCl Tab 100 MG: ORAL | 90 days supply | Qty: 180 | Fill #0 | Status: AC

## 2021-03-15 ENCOUNTER — Ambulatory Visit: Payer: No Typology Code available for payment source | Admitting: Occupational Therapy

## 2021-03-15 ENCOUNTER — Ambulatory Visit: Payer: No Typology Code available for payment source | Admitting: Physical Therapy

## 2021-03-15 ENCOUNTER — Other Ambulatory Visit: Payer: Self-pay

## 2021-03-15 DIAGNOSIS — M6281 Muscle weakness (generalized): Secondary | ICD-10-CM

## 2021-03-15 DIAGNOSIS — R278 Other lack of coordination: Secondary | ICD-10-CM

## 2021-03-15 MED ORDER — CARESTART COVID-19 HOME TEST VI KIT
PACK | 0 refills | Status: DC
  Filled 2021-03-15: qty 2, 4d supply, fill #0

## 2021-03-16 NOTE — Therapy (Signed)
[place] MAIN [facility] SERVICES [address] [place], [place], [address] Phone: [phone]   Fax:  [phone]  Occupational Therapy Treatment  Patient Details  Name: Kathryn Coffey MRN: [id number] Date of Birth: 08/17/1971 No data recorded  Encounter Date: 03/15/2021   OT End of Session - 03/17/21 0927     Visit Number 9    Number of Visits 25    Date for OT Re-Evaluation 05/11/21    Authorization Type Progress report period starting  on 12/06/20    Authorization Time Period FOTO    OT Start Time 1344    OT Stop Time 1432    OT Time Calculation (min) 48 min    Activity Tolerance Patient tolerated treatment well    Behavior During Therapy [facility] for tasks assessed/performed             Past Medical History:  Diagnosis Date   Abdominal wall cellulitis 2013 and 2014   Asthma    allergic to grasses   B12 deficiency    Complication of anesthesia    Costochondritis    Diabetes mellitus without complication ([place])    Gastric anomaly    multiple small ulcers   GERD (gastroesophageal reflux disease)    Herpes 06/2018   POSSIBLY IN EYE- PT TAKING VALTREX AND WILL SEE OPTHAMOLOGIST TO SEE IF VALTREX IS WORKING   History of abnormal cervical Pap smear    History of Clostridium difficile colitis    History of hiatal hernia    History of kidney stones    Hypertension    IBS (irritable bowel syndrome)    Migraine    Morbid obesity ([place])    s/p attempted gastric banding now decompressed   PCOS (polycystic ovarian syndrome)    PONV (postoperative nausea and vomiting)    WITH SPINAL ONLY   Stroke [facility])     Past Surgical History:  Procedure Laterality Date   BREAST EXCISIONAL BIOPSY Left    cyst excision - Dr. Patty Sermons   CESAREAN SECTION     CHOLECYSTECTOMY     COLONOSCOPY     EAR CYST EXCISION Right 07/04/2018   Procedure: EXCISION GLOMUS TUMOR THUMBNAIL;  Surgeon: Corky Mull, MD;  Location: [facility] ORS;  Service: Orthopedics;   Laterality: Right;   ESOPHAGOGASTRODUODENOSCOPY     ESOPHAGOGASTRODUODENOSCOPY (EGD) WITH PROPOFOL N/A 12/06/2015   Procedure: ESOPHAGOGASTRODUODENOSCOPY (EGD) WITH PROPOFOL;  Surgeon: Manya Silvas, MD;  Location: [facility] ENDOSCOPY;  Service: Endoscopy;  Laterality: N/A;   IR ANGIO INTRA EXTRACRAN SEL INTERNAL CAROTID UNI L MOD SED  07/15/2019   IR ANGIO VERTEBRAL SEL SUBCLAVIAN INNOMINATE UNI R MOD SED  07/15/2019   IR CT HEAD LTD  07/15/2019   IR INTRAVSC STENT CERV CAROTID W/O EMB-PROT MOD SED INC ANGIO  07/15/2019   IR PERCUTANEOUS ART THROMBECTOMY/INFUSION INTRACRANIAL INC DIAG ANGIO  07/15/2019   LAPAROSCOPIC GASTRIC BANDING     LAPAROSCOPIC GASTRIC RESTRICTIVE DUODENAL PROCEDURE (DUODENAL SWITCH) N/A 01/25/2016   Procedure: LAPAROSCOPIC GASTRIC RESTRICTIVE DUODENAL PROCEDURE (DUODENAL SWITCH);  Surgeon: Ladora Daniel, MD;  Location: [facility] ORS;  Service: General;  Laterality: N/A;   OVARY SURGERY     x2   RADIOLOGY WITH ANESTHESIA N/A 07/15/2019   Procedure: IR WITH ANESTHESIA;  Surgeon: Luanne Bras, MD;  Location: [place];  Service: Radiology;  Laterality: N/A;   TONSILLECTOMY     TUBAL LIGATION     UMBILICAL HERNIA REPAIR N/A 01/25/2016   Procedure: LAPAROSCOPIC UMBILICAL HERNIA;  Surgeon: Ladora Daniel, MD;  Location: [facility] ORS;  Service: General;  Laterality: N/A;   UMBILICAL HERNIA REPAIR N/A 10/20/2016   Procedure: HERNIA REPAIR UMBILICAL ADULT;  Surgeon: Leonie Green, MD;  Location: [facility] ORS;  Service: General;  Laterality: N/A;    There were no vitals filed for this visit.   Subjective Assessment - 03/17/21 0926     Subjective  Pt reports she had a nice vacation last week, her son had covid but she did not get it.  Was late today and missed PT session due to construction traffic.    Patient is accompanied by: Family member    Pertinent History 50 year old female with history of CVA on 07/15/2019 with left sided weakness. Pt previously completed inpatient rehab and  outpatient therapy services. Patient has PMH significant for DM, GERD, HTN, Asthma, Herpes, Costochondritis, Vit B12 deficiency.    Limitations LUE AROM, [place]    Patient Stated Goals Patient would like to be as independent as possible, return to her prior level of function             Therapeutic Exercise:  AAROM with Stretching to LUE, therapist guiding for shoulder flexion, ABD, ADD, elbow flexion, extension for 12 reps for 1-2 sets each Focused on Wrist flexion/extension over blue wedge with active movement Additional focus on Supination of forearm with end range prolonged stretching Digits for flexion/extension, ABD/ADD, tapping and oppositional movements of thumb to each digit, difficulty slightly with opposition to small finger, increased effort. Thumb flexion/extension, radial and palmar ABD/ADD and focused on IP extension with cues and occasional therapist assist for end range extension.   2# dowel for shoulder flexion, ABD, ADD, chest press, forwards and backwards circles for 10 reps for 1-2 sets each.  Difficulty with maintaining left arm in shoulder flexion ranges greater than 90 degrees and tends to drift down with reps.   Pt with small burn on her right hand and reports she was trying to get something out of the oven one handed and burned herself.  We discussed use of wooden notched stick for oven that assists with pull out the oven rack and pushing it in, issued info on purchasing from [employer] if she decides she would like one.    Response to tx:   Pt continues to demonstrate limitations in left UE with active ROM, strength, increased tone and decreased fine motor coordination and control with use.  With overhead movements pt demonstrates decreased ROM and ability to sustain position for tasks such as hair care.  She continues to have to devote more focus on left UE to incorporate into daily tasks and with increased effort and purpose.  Recommended use of oven stick for safety and  to decrease the risk of burns.   Will plan to perform progress update next session on 10th visit with measurements, FOTO and update on goals.                      OT Education - 03/17/21 0926     Education Details HEP, ROM    Person(s) Educated Patient    Methods Explanation;Demonstration;Handout    Comprehension Verbalized understanding;Returned demonstration              OT Short Term Goals - 02/16/21 1329       OT SHORT TERM GOAL #1   Title Pt will independently complete HEP.    Baseline eval: pt completes mirror box therapy and [place] exercises  Time 6    Period Weeks    Status On-going    Target Date 05/11/21      OT SHORT TERM GOAL #2   Title Pt will cut meat with MOD I utilizing adaptive equipment as needed    Baseline Eval: uses good grips, requires MIN A, 6th visit: continued use of larger grip utensils, occasional assist with cutting    Time 6    Period Weeks    Status On-going    Target Date 03/30/21               OT Long Term Goals - 02/16/21 1320       OT LONG TERM GOAL #1   Title Pt will increase left shoulder flexion AROM by 20 degrees to complete hair washing/brushing with LUE.    Baseline Eval: seated shoulder flexion AROM 125*, 6th visit: left shoulder flexion 134, difficulty reaching to manage hair    Time 12    Period Weeks    Status On-going    Target Date 05/11/21      OT LONG TERM GOAL #2   Title Pt will increase left grip by 15# to assist with picking up objects and opening cans.    Baseline Eval: L grip 25#, 6th visit: 22#, continued difficulty with picking up items with left hand securely    Time 12    Period Weeks    Status On-going    Target Date 05/11/21      OT LONG TERM GOAL #3   Title Pt will Independently don/doff bra in sitting    Baseline Eval: unable to hook/unhook, 6th visit:  can snap and pull over but cannot hook when on body    Time 12    Period Weeks    Status On-going    Target Date 05/11/21       OT LONG TERM GOAL #4   Title Pt will improve L hand PEG time to less than 1 min to improve [place] and manipulate small objects during I/ADLs including folding laundry.    Baseline Eval: PEG L 3 min 59 sec, 6th visit: 1 min 1 sec still has difficulty with folding laundry neatly.    Time 12    Period Weeks    Status Revised    Target Date 05/11/21      OT LONG TERM GOAL #5   Title Pt will improve FOTO score 49 or greater to show clinically relevant change to engage in ADL and IADL tasks with greater independence    Baseline Eval: FOTO 45, 6ht visit: 34    Time 12    Period Weeks    Status On-going    Target Date 05/12/21      Long Term Additional Goals   Additional Long Term Goals Yes      OT LONG TERM GOAL #6   Title Pt to demonstrate improved score on typing proficiency test greater than 20 WPM    Baseline avg score is currently 10-12 WPM    Time 12    Period Weeks    Status New    Target Date 05/11/21      OT LONG TERM GOAL #7   Title Pt will demonstrate knowledge of strategies to manage bill paying with no late payments in a month.    Baseline has had several late payments recently    Time 6    Period Weeks    Status New    Target Date 03/30/21  OT LONG TERM GOAL #8   Title Pt will complete laundry with modified independence, folding with precision.    Baseline messy folding, lacking precision    Time 12    Period Weeks    Status New    Target Date 05/11/21      OT LONG TERM GOAL  #9   TITLE Pt will improve strength in left UE to manage all grocery bags from the cart to the car independently.    Baseline unable to manage heavier items    Time 12    Period Weeks    Status New    Target Date 05/11/21                   Plan - 03/17/21 [id number]     Clinical Impression Statement Pt continues to demonstrate limitations in left UE with active ROM, strength, increased tone and decreased fine motor coordination and control with use.  With overhead movements  pt demonstrates decreased ROM and ability to sustain position for tasks such as hair care.  She continues to have to devote more focus on left UE to incorporate into daily tasks and with increased effort and purpose.  Recommended use of oven stick for safety and to decrease the risk of burns.    Will plan to perform progress update next session on 10th visit with measurements, FOTO and update on goals.    OT Occupational Profile and History Detailed Assessment- Review of Records and additional review of physical, cognitive, psychosocial history related to current functional performance    Occupational performance deficits (Please refer to evaluation for details): ADL's;IADL's    Rehab Potential Good    Clinical Decision Making Several treatment options, min-mod task modification necessary    Comorbidities Affecting Occupational Performance: May have comorbidities impacting occupational performance    Modification or Assistance to Complete Evaluation  Min-Moderate modification of tasks or assist with assess necessary to complete eval    OT Frequency 2x / week    OT Duration 12 weeks    OT Treatment/Interventions Self-care/ADL training;Moist Heat;DME and/or AE instruction;Neuromuscular education;Therapeutic exercise;Therapeutic activities;Energy conservation;Patient/family education    Consulted and Agree with Plan of Care Patient             Patient will benefit from skilled therapeutic intervention in order to improve the following deficits and impairments:           Visit Diagnosis: Muscle weakness (generalized)  Other lack of coordination    Problem List Patient Active Problem List   Diagnosis Date Noted   E. coli UTI    Diabetes mellitus type 2 in obese (HCC)    Chronic pain of right knee    Flaccid monoplegia of upper extremity (HCC)    Hemiparesis affecting left side as late effect of stroke ([place])    Acute ischemic right middle cerebral artery (MCA) stroke ([place]) 07/22/2019    Carotid artery dissection  ([place]) s/p stent placement 07/21/2019   Diabetes mellitus type II, uncontrolled ([place]) 07/21/2019   Acute blood loss anemia 07/21/2019   Leukocytosis 07/21/2019   Stroke (cerebrum) ([place]) - R MCA infarct s/p tenecteplase and mechanical thrombectomy w/ d/t ICA dissection  07/15/2019   Middle cerebral artery embolism, right 07/15/2019   Controlled type 2 diabetes mellitus without complication, without long-term current use of insulin ([place]) 05/28/2017   Attention deficit hyperactivity disorder (ADHD), combined type 04/18/2017   Morbid obesity ([place]) 123456   Umbilical hernia [id number]   Hiatal hernia 12/08/2015  History of Clostridium difficile colitis 06/19/2014   HTN (hypertension) 12/23/2013   PCOS (polycystic ovarian syndrome) 12/23/2013   Bariatric surgery status 01/28/2013   Vibha Ferdig T Sherrye Puga, OTR/L, CLT  Quintina Hakeem 03/18/2021, 9:30 AM  [place] MAIN [facility] SERVICES [address] [place], [place], [address] Phone: [phone]   Fax:  [phone]  Name: TERRYE UHL MRN: [id number] Date of Birth: 05/03/1971

## 2021-03-17 ENCOUNTER — Encounter: Payer: No Typology Code available for payment source | Admitting: Occupational Therapy

## 2021-03-21 ENCOUNTER — Ambulatory Visit: Payer: No Typology Code available for payment source | Admitting: Physical Therapy

## 2021-03-21 ENCOUNTER — Ambulatory Visit
Payer: No Typology Code available for payment source | Attending: Physical Medicine and Rehabilitation | Admitting: Occupational Therapy

## 2021-03-28 ENCOUNTER — Ambulatory Visit: Payer: No Typology Code available for payment source | Admitting: Physical Therapy

## 2021-03-28 ENCOUNTER — Ambulatory Visit: Payer: No Typology Code available for payment source | Admitting: Occupational Therapy

## 2021-03-28 ENCOUNTER — Other Ambulatory Visit: Payer: Self-pay

## 2021-03-28 MED FILL — Triamterene & Hydrochlorothiazide Cap 37.5-25 MG: ORAL | 90 days supply | Qty: 90 | Fill #2 | Status: AC

## 2021-03-29 ENCOUNTER — Other Ambulatory Visit: Payer: Self-pay

## 2021-03-29 MED ORDER — TRULICITY 3 MG/0.5ML ~~LOC~~ SOAJ
SUBCUTANEOUS | 11 refills | Status: DC
  Filled 2021-03-29: qty 2, 28d supply, fill #0
  Filled 2021-04-18: qty 2, 28d supply, fill #1
  Filled 2021-05-16 (×2): qty 2, 28d supply, fill #2

## 2021-03-29 MED ORDER — LOSARTAN POTASSIUM-HCTZ 100-12.5 MG PO TABS
ORAL_TABLET | ORAL | 1 refills | Status: DC
  Filled 2021-03-29 – 2021-09-28 (×2): qty 45, 90d supply, fill #0
  Filled 2021-11-10: qty 45, 90d supply, fill #1

## 2021-03-30 ENCOUNTER — Other Ambulatory Visit: Payer: Self-pay

## 2021-04-01 ENCOUNTER — Other Ambulatory Visit: Payer: Self-pay

## 2021-04-01 MED ORDER — VYVANSE 50 MG PO CAPS
ORAL_CAPSULE | ORAL | 0 refills | Status: DC
  Filled 2021-04-01: qty 30, 30d supply, fill #0

## 2021-04-04 ENCOUNTER — Other Ambulatory Visit: Payer: Self-pay

## 2021-04-04 ENCOUNTER — Other Ambulatory Visit: Payer: Self-pay | Admitting: Physical Medicine and Rehabilitation

## 2021-04-04 ENCOUNTER — Encounter: Payer: Self-pay | Admitting: Physical Therapy

## 2021-04-04 DIAGNOSIS — R278 Other lack of coordination: Secondary | ICD-10-CM

## 2021-04-04 DIAGNOSIS — R262 Difficulty in walking, not elsewhere classified: Secondary | ICD-10-CM

## 2021-04-04 DIAGNOSIS — M6281 Muscle weakness (generalized): Secondary | ICD-10-CM

## 2021-04-04 DIAGNOSIS — R2689 Other abnormalities of gait and mobility: Secondary | ICD-10-CM

## 2021-04-04 MED ORDER — TIZANIDINE HCL 4 MG PO TABS
ORAL_TABLET | ORAL | 1 refills | Status: DC
  Filled 2021-04-04: qty 90, 30d supply, fill #0
  Filled 2021-06-22: qty 90, 30d supply, fill #1

## 2021-04-04 NOTE — Therapy (Signed)
[place] MAIN [facility] SERVICES [address] [place], [place], [address] Phone: [phone]   Fax:  [phone]  April 04, 2021   No Recipients  Physical Therapy Discharge Summary  Patient: ANDERSYN FRAGOSO  MRN: [id number]  Date of Birth: 1970-10-10   Diagnosis: Muscle weakness (generalized)  Other lack of coordination  Difficulty in walking, not elsewhere classified  Other abnormalities of gait and mobility Referring Provider (PT): shah   The above patient had been seen in Physical Therapy 14 times of 19 treatments scheduled with 0 no shows and 5 cancellations.  The treatment consisted of strength, e-stim, manual therapy, modalities The patient is: Improved  Subjective: Patient called and cancelled remaining appointments stating, "school is starting and my life is getting busy and I Just think I need to work on my exercises at home."   Discharge Findings: unable to obtain objective measures as patient failed to return to therapy  Functional Status at Discharge: see last progress note dated 02/22/21  Goals Partially Met    Sincerely,   Markcus Lazenby, PT, DPT   CC No Recipients  [place] [address] [place], [place], [address] Phone: [phone]   Fax:  [phone]  Patient: ATHALIA SETTERLUND  MRN: [id number]  Date of Birth: 05-24-1971

## 2021-04-05 ENCOUNTER — Ambulatory Visit: Payer: No Typology Code available for payment source | Admitting: Physical Therapy

## 2021-04-05 ENCOUNTER — Ambulatory Visit: Payer: No Typology Code available for payment source | Admitting: Occupational Therapy

## 2021-04-12 ENCOUNTER — Encounter: Payer: No Typology Code available for payment source | Admitting: Occupational Therapy

## 2021-04-12 ENCOUNTER — Ambulatory Visit: Payer: No Typology Code available for payment source | Admitting: Physical Therapy

## 2021-04-18 ENCOUNTER — Other Ambulatory Visit: Payer: Self-pay

## 2021-04-19 ENCOUNTER — Ambulatory Visit: Payer: No Typology Code available for payment source | Admitting: Physical Therapy

## 2021-04-19 ENCOUNTER — Encounter: Payer: No Typology Code available for payment source | Admitting: Occupational Therapy

## 2021-04-20 ENCOUNTER — Other Ambulatory Visit: Payer: Self-pay

## 2021-04-26 ENCOUNTER — Other Ambulatory Visit: Payer: Self-pay

## 2021-04-27 ENCOUNTER — Encounter: Payer: No Typology Code available for payment source | Admitting: Occupational Therapy

## 2021-04-27 ENCOUNTER — Ambulatory Visit: Payer: No Typology Code available for payment source | Admitting: Physical Therapy

## 2021-05-03 ENCOUNTER — Ambulatory Visit: Payer: No Typology Code available for payment source | Admitting: Physical Therapy

## 2021-05-03 ENCOUNTER — Encounter: Payer: No Typology Code available for payment source | Admitting: Occupational Therapy

## 2021-05-04 ENCOUNTER — Other Ambulatory Visit: Payer: Self-pay

## 2021-05-04 MED ORDER — VYVANSE 50 MG PO CAPS
ORAL_CAPSULE | ORAL | 0 refills | Status: DC
  Filled 2021-05-04: qty 30, 30d supply, fill #0

## 2021-05-10 ENCOUNTER — Ambulatory Visit: Payer: No Typology Code available for payment source | Admitting: Physical Therapy

## 2021-05-10 ENCOUNTER — Encounter: Payer: No Typology Code available for payment source | Admitting: Occupational Therapy

## 2021-05-11 ENCOUNTER — Other Ambulatory Visit: Payer: Self-pay

## 2021-05-11 MED FILL — Omeprazole Cap Delayed Release 20 MG: ORAL | 90 days supply | Qty: 90 | Fill #1 | Status: CN

## 2021-05-11 MED FILL — Omeprazole Cap Delayed Release 20 MG: ORAL | 90 days supply | Qty: 90 | Fill #1 | Status: AC

## 2021-05-16 ENCOUNTER — Other Ambulatory Visit: Payer: Self-pay

## 2021-05-17 ENCOUNTER — Ambulatory Visit: Payer: No Typology Code available for payment source | Admitting: Physical Therapy

## 2021-05-17 ENCOUNTER — Encounter: Payer: No Typology Code available for payment source | Admitting: Occupational Therapy

## 2021-05-17 ENCOUNTER — Other Ambulatory Visit: Payer: Self-pay

## 2021-05-17 MED ORDER — NYSTATIN-TRIAMCINOLONE 100000-0.1 UNIT/GM-% EX CREA
TOPICAL_CREAM | CUTANEOUS | 0 refills | Status: DC
  Filled 2021-05-17: qty 15, 5d supply, fill #0

## 2021-05-18 ENCOUNTER — Other Ambulatory Visit: Payer: Self-pay

## 2021-05-23 ENCOUNTER — Other Ambulatory Visit: Payer: Self-pay | Admitting: Physical Medicine and Rehabilitation

## 2021-05-23 ENCOUNTER — Other Ambulatory Visit: Payer: Self-pay

## 2021-05-23 MED ORDER — ETODOLAC 400 MG PO TABS
ORAL_TABLET | ORAL | 11 refills | Status: DC
  Filled 2021-05-23: qty 60, 30d supply, fill #0
  Filled 2022-01-05: qty 60, 30d supply, fill #1
  Filled 2022-04-13: qty 60, 30d supply, fill #2

## 2021-05-23 MED ORDER — METHOCARBAMOL 500 MG PO TABS
500.0000 mg | ORAL_TABLET | Freq: Four times a day (QID) | ORAL | 1 refills | Status: DC
  Filled 2021-05-23: qty 60, 15d supply, fill #0

## 2021-05-24 ENCOUNTER — Ambulatory Visit: Payer: No Typology Code available for payment source | Admitting: Physical Therapy

## 2021-05-24 ENCOUNTER — Encounter: Payer: No Typology Code available for payment source | Admitting: Occupational Therapy

## 2021-05-26 ENCOUNTER — Other Ambulatory Visit: Payer: Self-pay

## 2021-05-30 ENCOUNTER — Other Ambulatory Visit: Payer: Self-pay

## 2021-05-30 MED ORDER — MOUNJARO 10 MG/0.5ML ~~LOC~~ SOAJ
SUBCUTANEOUS | 11 refills | Status: DC
  Filled 2021-05-30: qty 2, 28d supply, fill #0
  Filled 2021-06-21: qty 2, 28d supply, fill #1
  Filled 2021-07-29: qty 2, 28d supply, fill #2
  Filled 2021-09-01: qty 2, 28d supply, fill #3
  Filled 2021-10-01: qty 2, 28d supply, fill #4

## 2021-05-31 ENCOUNTER — Ambulatory Visit: Payer: No Typology Code available for payment source | Admitting: Physical Therapy

## 2021-05-31 ENCOUNTER — Encounter: Payer: No Typology Code available for payment source | Admitting: Occupational Therapy

## 2021-06-02 ENCOUNTER — Other Ambulatory Visit: Payer: Self-pay

## 2021-06-03 ENCOUNTER — Other Ambulatory Visit: Payer: Self-pay

## 2021-06-03 MED ORDER — VYVANSE 50 MG PO CAPS
ORAL_CAPSULE | ORAL | 0 refills | Status: DC
  Filled 2021-06-03: qty 30, 30d supply, fill #0

## 2021-06-07 ENCOUNTER — Ambulatory Visit: Payer: No Typology Code available for payment source | Admitting: Physical Therapy

## 2021-06-07 ENCOUNTER — Encounter: Payer: No Typology Code available for payment source | Admitting: Occupational Therapy

## 2021-06-08 ENCOUNTER — Other Ambulatory Visit: Payer: Self-pay

## 2021-06-08 MED FILL — Bupropion HCl Tab 100 MG: ORAL | 90 days supply | Qty: 180 | Fill #1 | Status: AC

## 2021-06-14 ENCOUNTER — Encounter: Payer: No Typology Code available for payment source | Admitting: Occupational Therapy

## 2021-06-14 ENCOUNTER — Ambulatory Visit: Payer: No Typology Code available for payment source | Admitting: Physical Therapy

## 2021-06-15 ENCOUNTER — Other Ambulatory Visit: Payer: Self-pay

## 2021-06-15 MED FILL — Metformin HCl Tab 500 MG: ORAL | 90 days supply | Qty: 180 | Fill #1 | Status: AC

## 2021-06-20 ENCOUNTER — Other Ambulatory Visit: Payer: Self-pay

## 2021-06-20 MED FILL — Triamterene & Hydrochlorothiazide Cap 37.5-25 MG: ORAL | 90 days supply | Qty: 90 | Fill #3 | Status: AC

## 2021-06-21 ENCOUNTER — Ambulatory Visit: Payer: No Typology Code available for payment source | Admitting: Physical Therapy

## 2021-06-21 ENCOUNTER — Other Ambulatory Visit: Payer: Self-pay

## 2021-06-21 ENCOUNTER — Encounter: Payer: No Typology Code available for payment source | Admitting: Occupational Therapy

## 2021-06-22 ENCOUNTER — Other Ambulatory Visit: Payer: Self-pay

## 2021-06-28 ENCOUNTER — Encounter: Payer: No Typology Code available for payment source | Admitting: Occupational Therapy

## 2021-06-28 ENCOUNTER — Ambulatory Visit: Payer: No Typology Code available for payment source | Admitting: Physical Therapy

## 2021-07-01 ENCOUNTER — Other Ambulatory Visit: Payer: Self-pay

## 2021-07-01 MED ORDER — VYVANSE 50 MG PO CAPS
ORAL_CAPSULE | ORAL | 0 refills | Status: DC
  Filled 2021-07-01: qty 30, 30d supply, fill #0

## 2021-07-05 ENCOUNTER — Encounter: Payer: No Typology Code available for payment source | Admitting: Occupational Therapy

## 2021-07-05 ENCOUNTER — Other Ambulatory Visit: Payer: Self-pay

## 2021-07-05 ENCOUNTER — Ambulatory Visit: Payer: No Typology Code available for payment source | Admitting: Physical Therapy

## 2021-07-05 MED ORDER — HYDROCODONE BIT-HOMATROP MBR 5-1.5 MG/5ML PO SOLN
ORAL | 0 refills | Status: DC
  Filled 2021-07-05: qty 120, 6d supply, fill #0

## 2021-07-05 MED ORDER — AMOXICILLIN-POT CLAVULANATE 875-125 MG PO TABS
ORAL_TABLET | ORAL | 0 refills | Status: DC
  Filled 2021-07-05: qty 14, 7d supply, fill #0

## 2021-07-05 MED ORDER — PREDNISONE 10 MG PO TABS
ORAL_TABLET | ORAL | 0 refills | Status: DC
  Filled 2021-07-05: qty 10, 10d supply, fill #0

## 2021-07-06 ENCOUNTER — Other Ambulatory Visit: Payer: Self-pay

## 2021-07-06 MED ORDER — TIZANIDINE HCL 2 MG PO CAPS
ORAL_CAPSULE | ORAL | 1 refills | Status: DC
  Filled 2021-07-06: qty 90, 30d supply, fill #0

## 2021-07-12 ENCOUNTER — Encounter: Payer: No Typology Code available for payment source | Admitting: Occupational Therapy

## 2021-07-12 ENCOUNTER — Ambulatory Visit: Payer: No Typology Code available for payment source | Admitting: Physical Therapy

## 2021-07-19 ENCOUNTER — Encounter: Payer: No Typology Code available for payment source | Admitting: Occupational Therapy

## 2021-07-19 ENCOUNTER — Ambulatory Visit: Payer: No Typology Code available for payment source | Admitting: Physical Therapy

## 2021-07-21 ENCOUNTER — Other Ambulatory Visit: Payer: Self-pay

## 2021-07-26 ENCOUNTER — Ambulatory Visit: Payer: No Typology Code available for payment source | Admitting: Physical Therapy

## 2021-07-26 ENCOUNTER — Encounter: Payer: No Typology Code available for payment source | Admitting: Occupational Therapy

## 2021-07-27 ENCOUNTER — Other Ambulatory Visit: Payer: Self-pay | Admitting: Internal Medicine

## 2021-07-27 ENCOUNTER — Other Ambulatory Visit: Payer: Self-pay

## 2021-07-27 DIAGNOSIS — Z1231 Encounter for screening mammogram for malignant neoplasm of breast: Secondary | ICD-10-CM

## 2021-07-29 ENCOUNTER — Other Ambulatory Visit: Payer: Self-pay

## 2021-07-29 MED ORDER — VYVANSE 50 MG PO CAPS
ORAL_CAPSULE | ORAL | 0 refills | Status: DC
  Filled 2021-08-01: qty 30, 30d supply, fill #0

## 2021-08-01 ENCOUNTER — Other Ambulatory Visit: Payer: Self-pay

## 2021-08-02 ENCOUNTER — Encounter: Payer: No Typology Code available for payment source | Admitting: Occupational Therapy

## 2021-08-02 ENCOUNTER — Ambulatory Visit: Payer: No Typology Code available for payment source | Admitting: Physical Therapy

## 2021-08-09 ENCOUNTER — Ambulatory Visit: Payer: No Typology Code available for payment source | Admitting: Physical Therapy

## 2021-08-09 ENCOUNTER — Encounter: Payer: No Typology Code available for payment source | Admitting: Occupational Therapy

## 2021-08-10 ENCOUNTER — Other Ambulatory Visit: Payer: Self-pay

## 2021-08-10 MED ORDER — OMEPRAZOLE 20 MG PO CPDR
DELAYED_RELEASE_CAPSULE | ORAL | 2 refills | Status: AC
  Filled 2021-08-10: qty 90, 90d supply, fill #0
  Filled 2021-11-10 – 2021-11-14 (×2): qty 90, 90d supply, fill #1

## 2021-08-16 ENCOUNTER — Other Ambulatory Visit: Payer: Self-pay

## 2021-08-16 ENCOUNTER — Encounter: Payer: No Typology Code available for payment source | Admitting: Occupational Therapy

## 2021-08-16 ENCOUNTER — Ambulatory Visit: Payer: No Typology Code available for payment source | Admitting: Physical Therapy

## 2021-08-16 MED ORDER — DOXYCYCLINE HYCLATE 100 MG PO CAPS
100.0000 mg | ORAL_CAPSULE | Freq: Two times a day (BID) | ORAL | 0 refills | Status: DC
  Filled 2021-08-16: qty 14, 7d supply, fill #0

## 2021-08-16 MED ORDER — PREDNISONE 20 MG PO TABS
ORAL_TABLET | ORAL | 0 refills | Status: DC
  Filled 2021-08-16: qty 7, 7d supply, fill #0

## 2021-08-23 ENCOUNTER — Encounter: Payer: No Typology Code available for payment source | Admitting: Occupational Therapy

## 2021-08-23 ENCOUNTER — Ambulatory Visit: Payer: No Typology Code available for payment source | Admitting: Physical Therapy

## 2021-08-30 ENCOUNTER — Encounter: Payer: No Typology Code available for payment source | Admitting: Occupational Therapy

## 2021-08-30 ENCOUNTER — Ambulatory Visit: Payer: No Typology Code available for payment source | Admitting: Physical Therapy

## 2021-08-31 ENCOUNTER — Other Ambulatory Visit: Payer: Self-pay

## 2021-08-31 MED ORDER — VYVANSE 50 MG PO CAPS
ORAL_CAPSULE | ORAL | 0 refills | Status: DC
  Filled 2021-08-31: qty 90, 90d supply, fill #0

## 2021-09-01 ENCOUNTER — Other Ambulatory Visit: Payer: Self-pay

## 2021-09-02 ENCOUNTER — Other Ambulatory Visit: Payer: Self-pay

## 2021-09-02 MED ORDER — CARESTART COVID-19 HOME TEST VI KIT
PACK | 0 refills | Status: DC
  Filled 2021-09-02: qty 2, 4d supply, fill #0

## 2021-09-06 ENCOUNTER — Ambulatory Visit: Payer: No Typology Code available for payment source | Admitting: Physical Therapy

## 2021-09-06 ENCOUNTER — Encounter: Payer: No Typology Code available for payment source | Admitting: Occupational Therapy

## 2021-09-12 ENCOUNTER — Other Ambulatory Visit: Payer: Self-pay

## 2021-09-12 MED ORDER — TIZANIDINE HCL 4 MG PO TABS
ORAL_TABLET | ORAL | 11 refills | Status: DC
  Filled 2021-09-12: qty 90, 30d supply, fill #0

## 2021-09-13 ENCOUNTER — Ambulatory Visit: Payer: No Typology Code available for payment source | Admitting: Physical Therapy

## 2021-09-13 ENCOUNTER — Other Ambulatory Visit: Payer: Self-pay

## 2021-09-13 ENCOUNTER — Encounter: Payer: No Typology Code available for payment source | Admitting: Occupational Therapy

## 2021-09-13 MED FILL — Bupropion HCl Tab 100 MG: ORAL | 90 days supply | Qty: 180 | Fill #2 | Status: AC

## 2021-09-14 ENCOUNTER — Other Ambulatory Visit: Payer: Self-pay

## 2021-09-14 MED FILL — Triamterene & Hydrochlorothiazide Cap 37.5-25 MG: ORAL | 30 days supply | Qty: 30 | Fill #4 | Status: AC

## 2021-09-14 MED FILL — Metformin HCl Tab 500 MG: ORAL | 90 days supply | Qty: 180 | Fill #2 | Status: AC

## 2021-09-20 ENCOUNTER — Ambulatory Visit: Payer: No Typology Code available for payment source | Admitting: Physical Therapy

## 2021-09-20 ENCOUNTER — Encounter: Payer: No Typology Code available for payment source | Admitting: Occupational Therapy

## 2021-09-23 ENCOUNTER — Ambulatory Visit
Admission: RE | Admit: 2021-09-23 | Discharge: 2021-09-23 | Disposition: A | Discharge status: Home / Self Care | Payer: No Typology Code available for payment source | Source: Ambulatory Visit | Attending: Internal Medicine | Admitting: Internal Medicine

## 2021-09-23 ENCOUNTER — Other Ambulatory Visit: Payer: Self-pay

## 2021-09-23 DIAGNOSIS — Z1231 Encounter for screening mammogram for malignant neoplasm of breast: Secondary | ICD-10-CM | POA: Insufficient documentation

## 2021-09-27 ENCOUNTER — Encounter: Payer: No Typology Code available for payment source | Admitting: Occupational Therapy

## 2021-09-27 ENCOUNTER — Ambulatory Visit: Payer: No Typology Code available for payment source | Admitting: Physical Therapy

## 2021-09-28 ENCOUNTER — Other Ambulatory Visit: Payer: Self-pay

## 2021-10-03 ENCOUNTER — Other Ambulatory Visit: Payer: Self-pay

## 2021-10-04 ENCOUNTER — Ambulatory Visit: Payer: No Typology Code available for payment source | Admitting: Physical Therapy

## 2021-10-04 ENCOUNTER — Encounter: Payer: No Typology Code available for payment source | Admitting: Occupational Therapy

## 2021-10-20 ENCOUNTER — Other Ambulatory Visit: Payer: Self-pay

## 2021-10-20 MED ORDER — MOUNJARO 12.5 MG/0.5ML ~~LOC~~ SOAJ
SUBCUTANEOUS | 5 refills | Status: DC
  Filled 2021-10-20: qty 2, 28d supply, fill #0
  Filled 2021-11-14: qty 2, 28d supply, fill #1
  Filled 2021-12-07: qty 2, 28d supply, fill #2

## 2021-10-24 ENCOUNTER — Other Ambulatory Visit: Payer: Self-pay

## 2021-10-24 MED ORDER — TRIAMTERENE-HCTZ 37.5-25 MG PO CAPS
1.0000 | ORAL_CAPSULE | Freq: Every morning | ORAL | 5 refills | Status: DC
  Filled 2021-10-24 – 2022-01-18 (×2): qty 30, 30d supply, fill #0
  Filled 2022-02-16: qty 30, 30d supply, fill #1
  Filled 2022-03-20: qty 30, 30d supply, fill #2
  Filled 2022-04-25: qty 30, 30d supply, fill #3
  Filled 2022-05-22: qty 30, 30d supply, fill #4
  Filled 2022-06-19: qty 30, 30d supply, fill #5

## 2021-10-25 ENCOUNTER — Other Ambulatory Visit: Payer: Self-pay

## 2021-10-25 MED ORDER — FREESTYLE LANCETS MISC
1 refills | Status: AC
  Filled 2021-10-25: qty 200, 90d supply, fill #0

## 2021-10-25 MED ORDER — FREESTYLE FREEDOM LITE W/DEVICE KIT
PACK | 0 refills | Status: AC
  Filled 2021-10-25: qty 1, 30d supply, fill #0

## 2021-10-25 MED ORDER — FREESTYLE LITE TEST VI STRP
ORAL_STRIP | 2 refills | Status: AC
  Filled 2021-10-25: qty 200, 90d supply, fill #0

## 2021-11-08 ENCOUNTER — Other Ambulatory Visit: Payer: Self-pay

## 2021-11-08 MED ORDER — DICLOFENAC SODIUM 1 % EX GEL
2.0000 g | Freq: Four times a day (QID) | CUTANEOUS | 1 refills | Status: DC
  Filled 2021-11-08: qty 100, 10d supply, fill #0

## 2021-11-09 ENCOUNTER — Other Ambulatory Visit: Payer: Self-pay

## 2021-11-10 ENCOUNTER — Other Ambulatory Visit: Payer: Self-pay

## 2021-11-10 MED ORDER — LOSARTAN POTASSIUM-HCTZ 100-12.5 MG PO TABS
1.0000 | ORAL_TABLET | Freq: Every day | ORAL | 3 refills | Status: DC
  Filled 2021-11-10 – 2021-11-23 (×3): qty 90, 90d supply, fill #0
  Filled 2022-02-17: qty 90, 90d supply, fill #1
  Filled 2022-05-22: qty 90, 90d supply, fill #2
  Filled 2022-08-23: qty 90, 90d supply, fill #3

## 2021-11-11 ENCOUNTER — Other Ambulatory Visit: Payer: Self-pay

## 2021-11-11 MED ORDER — ESCITALOPRAM OXALATE 10 MG PO TABS
ORAL_TABLET | ORAL | 1 refills | Status: DC
Start: 2021-11-11 — End: 2022-11-23
  Filled 2021-11-11: qty 90, 90d supply, fill #0
  Filled 2022-11-08: qty 90, 90d supply, fill #1

## 2021-11-14 ENCOUNTER — Other Ambulatory Visit: Payer: Self-pay

## 2021-11-16 ENCOUNTER — Other Ambulatory Visit: Payer: Self-pay

## 2021-11-16 MED ORDER — SCOPOLAMINE 1 MG/3DAYS TD PT72
MEDICATED_PATCH | TRANSDERMAL | 0 refills | Status: DC
  Filled 2021-11-16: qty 10, 30d supply, fill #0

## 2021-11-16 MED ORDER — METFORMIN HCL 500 MG PO TABS
ORAL_TABLET | ORAL | 3 refills | Status: DC
  Filled 2021-11-16: qty 180, 90d supply, fill #0

## 2021-11-16 MED ORDER — AMOXICILLIN-POT CLAVULANATE 875-125 MG PO TABS
ORAL_TABLET | ORAL | 0 refills | Status: DC
  Filled 2021-11-16: qty 14, 7d supply, fill #0

## 2021-11-16 MED ORDER — ONDANSETRON 4 MG PO TBDP
ORAL_TABLET | ORAL | 0 refills | Status: AC
  Filled 2021-11-16: qty 20, 7d supply, fill #0

## 2021-11-18 ENCOUNTER — Other Ambulatory Visit: Payer: Self-pay

## 2021-11-18 MED ORDER — PREDNISONE 20 MG PO TABS
ORAL_TABLET | ORAL | 0 refills | Status: DC
  Filled 2021-11-18: qty 10, 10d supply, fill #0

## 2021-11-23 ENCOUNTER — Other Ambulatory Visit: Payer: Self-pay

## 2021-11-24 ENCOUNTER — Other Ambulatory Visit: Payer: Self-pay

## 2021-11-24 MED ORDER — VYVANSE 50 MG PO CAPS
ORAL_CAPSULE | ORAL | 0 refills | Status: DC
  Filled 2021-11-28: qty 90, 90d supply, fill #0

## 2021-11-28 ENCOUNTER — Other Ambulatory Visit: Payer: Self-pay

## 2021-12-07 ENCOUNTER — Other Ambulatory Visit: Payer: Self-pay

## 2021-12-07 MED ORDER — BUPROPION HCL 100 MG PO TABS
ORAL_TABLET | ORAL | 1 refills | Status: AC
  Filled 2021-12-07: qty 180, 90d supply, fill #0
  Filled 2022-03-03: qty 180, 90d supply, fill #1

## 2021-12-14 ENCOUNTER — Other Ambulatory Visit: Payer: Self-pay

## 2021-12-14 MED ORDER — METFORMIN HCL 500 MG PO TABS
ORAL_TABLET | ORAL | 3 refills | Status: AC
  Filled 2021-12-14: qty 180, 90d supply, fill #0
  Filled 2022-03-27: qty 180, 90d supply, fill #1
  Filled 2022-06-19: qty 180, 90d supply, fill #2
  Filled 2022-09-19: qty 180, 90d supply, fill #3

## 2021-12-26 ENCOUNTER — Other Ambulatory Visit: Payer: Self-pay

## 2021-12-29 ENCOUNTER — Other Ambulatory Visit: Payer: Self-pay

## 2021-12-29 ENCOUNTER — Other Ambulatory Visit: Payer: Self-pay | Admitting: Surgery

## 2021-12-29 MED ORDER — MOUNJARO 15 MG/0.5ML ~~LOC~~ SOAJ
SUBCUTANEOUS | 5 refills | Status: DC
  Filled 2021-12-29: qty 2, 28d supply, fill #0

## 2022-01-05 ENCOUNTER — Other Ambulatory Visit: Payer: Self-pay

## 2022-01-10 ENCOUNTER — Other Ambulatory Visit
Admission: RE | Admit: 2022-01-10 | Discharge: 2022-01-10 | Disposition: A | Discharge status: Home / Self Care | Payer: No Typology Code available for payment source | Source: Ambulatory Visit | Attending: Surgery | Admitting: Surgery

## 2022-01-10 VITALS — Ht 64.0 in | Wt 238.0 lb

## 2022-01-10 DIAGNOSIS — Z01812 Encounter for preprocedural laboratory examination: Secondary | ICD-10-CM

## 2022-01-10 DIAGNOSIS — E669 Obesity, unspecified: Secondary | ICD-10-CM

## 2022-01-10 DIAGNOSIS — I10 Essential (primary) hypertension: Secondary | ICD-10-CM

## 2022-01-10 DIAGNOSIS — I6601 Occlusion and stenosis of right middle cerebral artery: Secondary | ICD-10-CM

## 2022-01-10 HISTORY — DX: Unspecified osteoarthritis, unspecified site: M19.90

## 2022-01-10 NOTE — Patient Instructions (Addendum)
Your procedure is scheduled on: Wednesday, May 31 Report to the Registration Desk on the 1st floor of the [facility]. To find out your arrival time, please call [phone] between 1PM - 3PM on: Tuesday, May 30 If your arrival time is 6:00 am, do not arrive prior to that time as the [place] entrance doors do not open until 6:00 am.  REMEMBER: Instructions that are not followed completely may result in serious medical risk, up to and including death; or upon the discretion of your surgeon and anesthesiologist your surgery may need to be rescheduled.  Do not eat food after midnight the night before surgery.  No gum chewing, lozengers or hard candies.  You may however, drink water up to 2 hours before you are scheduled to arrive for your surgery. Do not drink anything within 2 hours of your scheduled arrival time.  In addition, your doctor has ordered for you to drink the provided  Gatorade G2 Drinking this carbohydrate drink up to two hours before surgery helps to reduce insulin resistance and improve patient outcomes. Please complete drinking 2 hours prior to scheduled arrival time.  TAKE THESE MEDICATIONS THE MORNING OF SURGERY WITH A SIP OF WATER:  Bupropion Omeprazole - (take one the night before and one on the morning of surgery - helps to prevent nausea after surgery.)  Stop Metformin 2 days prior to surgery. Last day to take is Sunday, May 28. Resume AFTER surgery.  Mounjaro - do not take the morning of surgery. But, DO take AFTER surgery when you get home.  Continue taking your aspirin 81 mg up until the day of surgery but do NOT take on the day of surgery.  One week prior to surgery: starting May 24 Stop etodolac and Anti-inflammatories (NSAIDS) such as Advil, Aleve, Ibuprofen, Motrin, Naproxen, Naprosyn and Aspirin based products such as Excedrin, Goodys Powder, BC Powder. Stop ANY OVER THE COUNTER supplements until after surgery. Stop Calcium, vitamin D, multiple  vitamins You may however, continue to take Tylenol if needed for pain up until the day of surgery.  No Alcohol for 24 hours before or after surgery.  No Smoking including e-cigarettes for 24 hours prior to surgery.  No chewable tobacco products for at least 6 hours prior to surgery.  No nicotine patches on the day of surgery.  Do not use any "recreational" drugs for at least a week prior to your surgery.  Please be advised that the combination of cocaine and anesthesia may have negative outcomes, up to and including death. If you test positive for cocaine, your surgery will be cancelled.  On the morning of surgery brush your teeth with toothpaste and water, you may rinse your mouth with mouthwash if you wish. Do not swallow any toothpaste or mouthwash.  Use CHG Soap as directed on instruction sheet.  Do not wear jewelry, make-up, hairpins, clips or nail polish.  Do not wear lotions, powders, or perfumes.   Do not shave body from the neck down 48 hours prior to surgery just in case you cut yourself which could leave a site for infection.  Also, freshly shaved skin may become irritated if using the CHG soap.  Contact lenses, hearing aids and dentures may not be worn into surgery.  Do not bring valuables to the hospital. [facility] is not responsible for any missing/lost belongings or valuables.   Notify your doctor if there is any change in your medical condition (cold, fever, infection).  Wear comfortable clothing (specific  to your surgery type) to the hospital.  After surgery, you can help prevent lung complications by doing breathing exercises.  Take deep breaths and cough every 1-2 hours. Your doctor may order a device called an Incentive Spirometer to help you take deep breaths.  If you are being discharged the day of surgery, you will not be allowed to drive home. You will need a responsible adult (18 years or older) to drive you home and stay with you that night.   If you  are taking public transportation, you will need to have a responsible adult (18 years or older) with you. Please confirm with your physician that it is acceptable to use public transportation.   Please call the [place] Dept. at [phone] if you have any questions about these instructions.  Surgery Visitation Policy:  Patients undergoing a surgery or procedure may have two family members or support persons with them as long as the person is not COVID-19 positive or experiencing its symptoms.   Preparing for Surgery with [place] (CHG) Soap    Before surgery, you can play an important role by reducing the number of germs on your skin.  CHG (Chlorhexidine gluconate) soap is an antiseptic cleanser which kills germs and bonds with the skin to continue killing germs even after washing.  Please do not use if you have an allergy to CHG or antibacterial soaps. If your skin becomes reddened/irritated stop using the CHG.  1. Shower the NIGHT BEFORE SURGERY and the MORNING OF SURGERY with CHG soap.  2. If you choose to wash your hair, wash your hair first as usual with your normal shampoo.  3. After shampooing, rinse your hair and body thoroughly to remove the shampoo.  4. Use CHG as you would any other liquid soap. You can apply CHG directly to the skin and wash gently with a scrungie or a clean washcloth.  5. Apply the CHG soap to your body only from the neck down. Do not use on open wounds or open sores. Avoid contact with your eyes, ears, mouth, and genitals (private parts). Wash face and genitals (private parts) with your normal soap.  6. Wash thoroughly, paying special attention to the area where your surgery will be performed.  7. Thoroughly rinse your body with warm water.  8. Do not shower/wash with your normal soap after using and rinsing off the CHG soap.  9. Pat yourself dry with a clean towel.  10. Wear clean pajamas to bed the night before  surgery.  12. Place clean sheets on your bed the night of your first shower and do not sleep with pets.  13. Shower again with the CHG soap on the day of surgery prior to arriving at the hospital.  14. Do not apply any deodorants/lotions/powders.  15. Please wear clean clothes to the hospital.

## 2022-01-11 ENCOUNTER — Encounter
Admission: RE | Admit: 2022-01-11 | Discharge: 2022-01-11 | Disposition: A | Discharge status: Home / Self Care | Payer: No Typology Code available for payment source | Source: Ambulatory Visit | Attending: Surgery | Admitting: Surgery

## 2022-01-11 DIAGNOSIS — Z01812 Encounter for preprocedural laboratory examination: Secondary | ICD-10-CM

## 2022-01-11 DIAGNOSIS — E119 Type 2 diabetes mellitus without complications: Secondary | ICD-10-CM | POA: Diagnosis not present

## 2022-01-11 DIAGNOSIS — I6601 Occlusion and stenosis of right middle cerebral artery: Secondary | ICD-10-CM | POA: Insufficient documentation

## 2022-01-11 DIAGNOSIS — E669 Obesity, unspecified: Secondary | ICD-10-CM | POA: Diagnosis not present

## 2022-01-11 DIAGNOSIS — Z01818 Encounter for other preprocedural examination: Secondary | ICD-10-CM | POA: Insufficient documentation

## 2022-01-11 DIAGNOSIS — I10 Essential (primary) hypertension: Secondary | ICD-10-CM | POA: Insufficient documentation

## 2022-01-11 LAB — POTASSIUM: Potassium: 3.3 mmol/L — ABNORMAL LOW (ref 3.5–5.1)

## 2022-01-12 ENCOUNTER — Other Ambulatory Visit: Payer: Self-pay

## 2022-01-12 MED ORDER — POTASSIUM CHLORIDE CRYS ER 20 MEQ PO TBCR
EXTENDED_RELEASE_TABLET | ORAL | 0 refills | Status: DC
  Filled 2022-01-12: qty 5, 5d supply, fill #0

## 2022-01-18 ENCOUNTER — Ambulatory Visit: Payer: No Typology Code available for payment source | Admitting: Registered Nurse

## 2022-01-18 ENCOUNTER — Ambulatory Visit
Admission: RE | Admit: 2022-01-18 | Discharge: 2022-01-18 | Disposition: A | Discharge status: Home / Self Care | Payer: No Typology Code available for payment source | Attending: Surgery | Admitting: Surgery

## 2022-01-18 ENCOUNTER — Other Ambulatory Visit: Payer: Self-pay

## 2022-01-18 ENCOUNTER — Encounter
Admission: RE | Disposition: A | Discharge status: Home / Self Care | Payer: Self-pay | Source: Home / Self Care | Attending: Surgery

## 2022-01-18 ENCOUNTER — Encounter: Payer: Self-pay | Admitting: Surgery

## 2022-01-18 DIAGNOSIS — Z6841 Body Mass Index (BMI) 40.0 and over, adult: Secondary | ICD-10-CM | POA: Insufficient documentation

## 2022-01-18 DIAGNOSIS — E669 Obesity, unspecified: Secondary | ICD-10-CM

## 2022-01-18 DIAGNOSIS — Z8616 Personal history of COVID-19: Secondary | ICD-10-CM | POA: Diagnosis not present

## 2022-01-18 DIAGNOSIS — J45909 Unspecified asthma, uncomplicated: Secondary | ICD-10-CM | POA: Insufficient documentation

## 2022-01-18 DIAGNOSIS — E119 Type 2 diabetes mellitus without complications: Secondary | ICD-10-CM | POA: Insufficient documentation

## 2022-01-18 DIAGNOSIS — I10 Essential (primary) hypertension: Secondary | ICD-10-CM | POA: Diagnosis not present

## 2022-01-18 DIAGNOSIS — Z8673 Personal history of transient ischemic attack (TIA), and cerebral infarction without residual deficits: Secondary | ICD-10-CM | POA: Diagnosis not present

## 2022-01-18 DIAGNOSIS — G5601 Carpal tunnel syndrome, right upper limb: Secondary | ICD-10-CM | POA: Diagnosis present

## 2022-01-18 DIAGNOSIS — M50221 Other cervical disc displacement at C4-C5 level: Secondary | ICD-10-CM | POA: Diagnosis not present

## 2022-01-18 DIAGNOSIS — Z01812 Encounter for preprocedural laboratory examination: Secondary | ICD-10-CM

## 2022-01-18 DIAGNOSIS — K219 Gastro-esophageal reflux disease without esophagitis: Secondary | ICD-10-CM | POA: Diagnosis not present

## 2022-01-18 HISTORY — PX: CARPAL TUNNEL RELEASE: SHX101

## 2022-01-18 LAB — POCT I-STAT, CHEM 8
BUN: 33 mg/dL — ABNORMAL HIGH (ref 6–20)
Calcium, Ion: 1.16 mmol/L (ref 1.15–1.40)
Chloride: 103 mmol/L (ref 98–111)
Creatinine, Ser: 0.7 mg/dL (ref 0.44–1.00)
Glucose, Bld: 113 mg/dL — ABNORMAL HIGH (ref 70–99)
HCT: 41 % (ref 36.0–46.0)
Hemoglobin: 13.9 g/dL (ref 12.0–15.0)
Potassium: 4.5 mmol/L (ref 3.5–5.1)
Sodium: 141 mmol/L (ref 135–145)
TCO2: 26 mmol/L (ref 22–32)

## 2022-01-18 LAB — GLUCOSE, CAPILLARY: Glucose-Capillary: 119 mg/dL — ABNORMAL HIGH (ref 70–99)

## 2022-01-18 SURGERY — RELEASE, CARPAL TUNNEL, ENDOSCOPIC
Anesthesia: General | Site: Wrist | Laterality: Right

## 2022-01-18 MED ORDER — ETODOLAC 400 MG PO TABS: 400.0000 mg | ORAL_TABLET | Freq: Every day | ORAL | Status: AC | PRN

## 2022-01-18 MED ORDER — ESCITALOPRAM OXALATE 10 MG PO TABS: 10.0000 mg | ORAL_TABLET | Freq: Every day | ORAL | Status: AC

## 2022-01-18 MED ORDER — PROPOFOL 10 MG/ML IV BOLUS
INTRAVENOUS | Status: AC
  Filled 2022-01-18: qty 20

## 2022-01-18 MED ORDER — CEFAZOLIN SODIUM-DEXTROSE 2-4 GM/100ML-% IV SOLN
2.0000 g | INTRAVENOUS | Status: AC
  Administered 2022-01-18: 2 g via INTRAVENOUS

## 2022-01-18 MED ORDER — BUPIVACAINE HCL (PF) 0.5 % IJ SOLN
INTRAMUSCULAR | Status: AC
  Filled 2022-01-18: qty 30

## 2022-01-18 MED ORDER — CHLORHEXIDINE GLUCONATE 0.12 % MT SOLN: 15.0000 mL | Freq: Once | OROMUCOSAL | Status: AC

## 2022-01-18 MED ORDER — CHLORHEXIDINE GLUCONATE 0.12 % MT SOLN
OROMUCOSAL | Status: AC
  Administered 2022-01-18: 15 mL via OROMUCOSAL
  Filled 2022-01-18: qty 15

## 2022-01-18 MED ORDER — MOUNJARO 15 MG/0.5ML ~~LOC~~ SOAJ: 15.0000 mg | SUBCUTANEOUS | Status: AC

## 2022-01-18 MED ORDER — LIDOCAINE HCL (PF) 0.5 % IJ SOLN
INTRAMUSCULAR | Status: AC
  Filled 2022-01-18: qty 50

## 2022-01-18 MED ORDER — TIZANIDINE HCL 4 MG PO TABS
4.0000 mg | ORAL_TABLET | Freq: Every day | ORAL | Status: AC
Start: 2022-01-18 — End: ?

## 2022-01-18 MED ORDER — DICLOFENAC SODIUM 1 % EX GEL: 2.0000 g | CUTANEOUS | Status: AC | PRN

## 2022-01-18 MED ORDER — ONDANSETRON HCL 4 MG/2ML IJ SOLN
INTRAMUSCULAR | Status: AC
  Filled 2022-01-18: qty 2

## 2022-01-18 MED ORDER — BUPIVACAINE HCL (PF) 0.5 % IJ SOLN
INTRAMUSCULAR | Status: DC | PRN
  Administered 2022-01-18: 10 mL

## 2022-01-18 MED ORDER — ORAL CARE MOUTH RINSE: 15.0000 mL | Freq: Once | OROMUCOSAL | Status: AC

## 2022-01-18 MED ORDER — SODIUM CHLORIDE 0.9 % IV SOLN: INTRAVENOUS | Status: DC

## 2022-01-18 MED ORDER — OXYCODONE HCL 5 MG PO TABS: 5.0000 mg | ORAL_TABLET | ORAL | 0 refills | Status: AC | PRN

## 2022-01-18 MED ORDER — OXYCODONE HCL 5 MG PO TABS
5.0000 mg | ORAL_TABLET | ORAL | 0 refills | Status: AC
  Filled 2022-01-18: qty 20, 3d supply, fill #0

## 2022-01-18 MED ORDER — CEFAZOLIN SODIUM-DEXTROSE 2-4 GM/100ML-% IV SOLN
INTRAVENOUS | Status: AC
  Filled 2022-01-18: qty 100

## 2022-01-18 MED ORDER — DEXAMETHASONE SODIUM PHOSPHATE 10 MG/ML IJ SOLN
INTRAMUSCULAR | Status: AC
  Filled 2022-01-18: qty 1

## 2022-01-18 MED ORDER — PROPOFOL 10 MG/ML IV BOLUS
INTRAVENOUS | Status: DC | PRN
  Administered 2022-01-18: 70 mg via INTRAVENOUS
  Administered 2022-01-18: 50 ug/kg/min via INTRAVENOUS

## 2022-01-18 MED ORDER — LIDOCAINE HCL (CARDIAC) PF 100 MG/5ML IV SOSY
PREFILLED_SYRINGE | INTRAVENOUS | Status: DC | PRN
  Administered 2022-01-18: 250 mg via INTRAVENOUS

## 2022-01-18 MED ORDER — LIDOCAINE HCL (PF) 2 % IJ SOLN
INTRAMUSCULAR | Status: AC
  Filled 2022-01-18: qty 5

## 2022-01-18 MED ORDER — OMEPRAZOLE 20 MG PO CPDR: 20.0000 mg | DELAYED_RELEASE_CAPSULE | Freq: Every day | ORAL | Status: AC

## 2022-01-18 MED ORDER — MIDAZOLAM HCL 2 MG/2ML IJ SOLN
INTRAMUSCULAR | Status: DC | PRN
  Administered 2022-01-18: 2 mg via INTRAVENOUS

## 2022-01-18 MED ORDER — PROPOFOL 1000 MG/100ML IV EMUL
INTRAVENOUS | Status: AC
  Filled 2022-01-18: qty 100

## 2022-01-18 MED ORDER — MIDAZOLAM HCL 2 MG/2ML IJ SOLN
INTRAMUSCULAR | Status: AC
  Filled 2022-01-18: qty 2

## 2022-01-18 MED ORDER — FENTANYL CITRATE (PF) 100 MCG/2ML IJ SOLN
INTRAMUSCULAR | Status: DC | PRN
  Administered 2022-01-18: 50 ug via INTRAVENOUS

## 2022-01-18 MED ORDER — KETAMINE HCL 50 MG/5ML IJ SOSY
PREFILLED_SYRINGE | INTRAMUSCULAR | Status: AC
  Filled 2022-01-18: qty 5

## 2022-01-18 MED ORDER — SEVOFLURANE IN SOLN
RESPIRATORY_TRACT | Status: AC
  Filled 2022-01-18: qty 250

## 2022-01-18 MED ORDER — 0.9 % SODIUM CHLORIDE (POUR BTL) OPTIME
TOPICAL | Status: DC | PRN
  Administered 2022-01-18: 500 mL

## 2022-01-18 MED ORDER — FENTANYL CITRATE (PF) 100 MCG/2ML IJ SOLN
INTRAMUSCULAR | Status: AC
  Filled 2022-01-18: qty 2

## 2022-01-18 MED ORDER — METHOCARBAMOL 500 MG PO TABS: 500.0000 mg | ORAL_TABLET | Freq: Four times a day (QID) | ORAL | Status: AC | PRN

## 2022-01-18 SURGICAL SUPPLY — 31 items
BNDG COHESIVE 4X5 TAN ST LF (GAUZE/BANDAGES/DRESSINGS) ×2 IMPLANT
BNDG ELASTIC 2X5.8 VLCR STR LF (GAUZE/BANDAGES/DRESSINGS) ×2 IMPLANT
BNDG ESMARK 4X12 TAN STRL LF (GAUZE/BANDAGES/DRESSINGS) ×2 IMPLANT
CHLORAPREP W/TINT 26 (MISCELLANEOUS) ×2 IMPLANT
CORD BIP STRL DISP 12FT (MISCELLANEOUS) ×2 IMPLANT
CUFF TOURN SGL QUICK 18X4 (TOURNIQUET CUFF) ×2 IMPLANT
DRAPE SURG 17X11 SM STRL (DRAPES) ×2 IMPLANT
FORCEPS JEWEL BIP 4-3/4 STR (INSTRUMENTS) ×2 IMPLANT
GAUZE SPONGE 4X4 12PLY STRL (GAUZE/BANDAGES/DRESSINGS) ×2 IMPLANT
GAUZE XEROFORM 1X8 LF (GAUZE/BANDAGES/DRESSINGS) ×2 IMPLANT
GLOVE BIO SURGEON STRL SZ8 (GLOVE) ×2 IMPLANT
GLOVE SURG UNDER LTX SZ8 (GLOVE) ×2 IMPLANT
GOWN STRL REUS W/ TWL LRG LVL3 (GOWN DISPOSABLE) ×1 IMPLANT
GOWN STRL REUS W/ TWL XL LVL3 (GOWN DISPOSABLE) ×1 IMPLANT
GOWN STRL REUS W/TWL LRG LVL3 (GOWN DISPOSABLE) ×1
GOWN STRL REUS W/TWL XL LVL3 (GOWN DISPOSABLE) ×1
KIT CARPAL TUNNEL (MISCELLANEOUS) ×1
KIT ESCP INSRT D SLOT CANN KN (MISCELLANEOUS) ×1 IMPLANT
KIT TURNOVER KIT A (KITS) ×2 IMPLANT
MANIFOLD NEPTUNE II (INSTRUMENTS) ×2 IMPLANT
NS IRRIG 500ML POUR BTL (IV SOLUTION) ×2 IMPLANT
PACK EXTREMITY ARMC (MISCELLANEOUS) ×2 IMPLANT
SPLINT WRIST LG LT TX990309 (SOFTGOODS) IMPLANT
SPLINT WRIST LG RT TX900304 (SOFTGOODS) ×1 IMPLANT
SPLINT WRIST M LT TX990308 (SOFTGOODS) IMPLANT
SPLINT WRIST M RT TX990303 (SOFTGOODS) IMPLANT
SPLINT WRIST XL LT TX990310 (SOFTGOODS) IMPLANT
SPLINT WRIST XL RT TX990305 (SOFTGOODS) IMPLANT
STOCKINETTE IMPERVIOUS 9X36 MD (GAUZE/BANDAGES/DRESSINGS) ×2 IMPLANT
SUT PROLENE 4 0 PS 2 18 (SUTURE) ×2 IMPLANT
WATER STERILE IRR 500ML POUR (IV SOLUTION) ×2 IMPLANT

## 2022-01-18 NOTE — Discharge Instructions (Addendum)
Orthopedic discharge instructions: Keep dressing dry and intact. Keep hand elevated above heart level. May shower after dressing removed on postop day 4 (Sunday). Cover sutures with Band-Aids after drying off. Apply ice to affected area frequently. Take Lodine 400 mg BID OR ibuprofen 600-800 mg TID with meals for 7-10 days, then as necessary. Take ES Tylenol or pain medication as prescribed when needed.  Return for follow-up in 10-14 days or as scheduled.  AMBULATORY SURGERY  DISCHARGE INSTRUCTIONS   The drugs that you were given will stay in your system until tomorrow so for the next 24 hours you should not:  Drive an automobile Make any legal decisions Drink any alcoholic beverage   You may resume regular meals tomorrow.  Today it is better to start with liquids and gradually work up to solid foods.  You may eat anything you prefer, but it is better to start with liquids, then soup and crackers, and gradually work up to solid foods.   Please notify your doctor immediately if you have any unusual bleeding, trouble breathing, redness and pain at the surgery site, drainage, fever, or pain not relieved by medication.    Additional Instructions:        Please contact your physician with any problems or Same Day Surgery at [phone], Monday through Friday 6 am to 4 pm, or [place] at [facility] number at [phone].

## 2022-01-18 NOTE — Anesthesia Procedure Notes (Signed)
Anesthesia Regional Block: Bier block (IV Regional)   Pre-Anesthetic Checklist: , timeout performed,  Correct Patient, Correct Site, Correct Laterality,  Correct Procedure, Correct Position, site marked,  Risks and benefits discussed,  Surgical consent,  Pre-op evaluation,  At surgeon's request and post-op pain management  Laterality: Right and Upper  Prep: alcohol swabs       Needles:  Injection technique: Single-shot       Needle Gauge: 20     Additional Needles:   Procedures:,,,,, intact distal pulses, Esmarch exsanguination,  #20gu IV placed and double tourniquet utilized    Narrative:  Start time: 01/18/2022 7:40 AM End time: 01/18/2022 7:43 AM Injection made incrementally with aspirations every 5 mL.  Performed by: With CRNAs   Additional Notes: The patient tolerated the procedure well.

## 2022-01-18 NOTE — Op Note (Signed)
01/18/2022  8:24 AM  Patient:   Kathryn Coffey  Pre-Op Diagnosis:   Right carpal tunnel syndrome.  Post-Op Diagnosis:   Same.  Procedure:   Endoscopic right carpal tunnel release.  Surgeon:   Pascal Lux, MD  Anesthesia:   Bier block with IV sedation  Findings:   As above.  Complications:   None  EBL:   1 cc  Fluids:   500 cc crystalloid  TT:   26 minutes at 250 mmHg  Drains:   None  Closure:   4-0 Prolene interrupted sutures  Brief Clinical Note:   The patient is a 51 year old female with a long history of gradually worsening pain and paresthesias to her right hand. Her symptoms have progressed despite medications, activity modification, etc. Her history and examination consistent with carpal tunnel syndrome, confirmed by EMG. The patient presents at this time for an endoscopic right carpal tunnel release.   Procedure:   The patient was brought into the operating room and lain in the supine position. After adequate IV sedation was achieved, a timeout was performed to verify the appropriate surgical site. The anesthesiologist then performed a Bier block and elevated the tourniquet to 250 mmHg after exsanguinating the arm. The right hand and upper extremity were prepped with ChloraPrep solution before being draped sterilely. Preoperative antibiotics were administered. A second timeout was performed to verify the appropriate surgical site before the limb was exsanguinated with an Esmarch and the tourniquet inflated to 250 mmHg.   An approximately 1.5-2 cm incision was made over the volar wrist flexion crease, centered over the palmaris longus tendon. The incision was carried down through the subcutaneous tissues with care taken to identify and protect any neurovascular structures. The distal forearm fascia was penetrated just proximal to the transverse carpal ligament. The soft tissues were released off the superficial and deep surfaces of the distal forearm fascia and this was  released proximally for 3-4 cm under direct visualization.  Attention was directed distally. The Soil scientist was passed beneath the transverse carpal ligament along the ulnar aspect of the carpal tunnel and used to release any adhesions as well as to remove any adherent synovial tissue before first the smaller then the larger of the two dilators were passed beneath the transverse carpal ligament along the ulnar margin of the carpal tunnel. The slotted cannula was introduced and the endoscope was placed into the slotted cannula and the undersurface of the transverse carpal ligament visualized. The distal margin of the transverse carpal ligament was marked by placing a 25-gauge needle percutaneously at [place] cardinal point so that it entered the distal portion of the slotted cannula. Under endoscopic visualization, the transverse carpal ligament was released from proximal to distal using the end-cutting blade. A second pass was performed to ensure complete release of the ligament. The adequacy of release was verified both endoscopically and by palpation using the freer elevator.  The wound was irrigated thoroughly with sterile saline solution before being closed using 4-0 Prolene interrupted sutures. A total of 10 cc of 0.5% plain Sensorcaine was injected in and around the incision before a sterile bulky dressing was applied to the wound. The patient was placed into a volar wrist splint before being awakened, extubated, and returned to the recovery room in satisfactory condition after tolerating the procedure well.

## 2022-01-18 NOTE — H&P (Signed)
History of Present Illness:  Kathryn Coffey is a 51 y.o. female who presents for evaluation and treatment of a long history of pain and paresthesias to her right hand. The patient first noticed the symptoms approximately 20 years ago while pregnant with her son. However, the symptoms have worsened over the past 2 years, prompting her to seek further evaluation and treatment. She saw Dr. Orion Modest who performed an ultrasound-guided carpal tunnel injection in July, 2021. Subsequently, she has been working with Dr. 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 In addition, she has an MRI documented right C5-6 small herniated disc, as well as a small right uncovertebral osteophyte at C4-5. The patient underwent an EMG of her right upper extremity in July 2022 which confirmed the presence of moderate carpal tunnel syndrome on the right. She apparently underwent a repeat injection into her right carpal tunnel by Dr. Manuella Ghazi in September, 2022 which provided temporary partial relief of her symptoms. However, the patient notes that her symptoms have recurred and have continued to worsen. She has pain and paresthesias at night, as well as with any more sustained activities during the day. Her symptoms are aggravated with driving as well as with repetitive activities. She has tried wearing Velcro splints but finds these provide little relief. She also finds it quite difficult to tolerate. She presents at this time for discussion of further treatment options.  Current Outpatient Medications:  albuterol sulfate 90 mcg/actuation AePB Inhale 180 mcg into the lungs every 6 (six) hours as needed.   ASPIRIN ORAL Take 81 mg by mouth once daily coated   biotin 10,000 mcg Cap Take by mouth   blood glucose diagnostic test strip Use 1 each (1 strip total) 2 (two) times daily Use as instructed. 100 each 12   blood glucose meter kit as directed 1 each 1   blood  pressure test kit-large Kit   buPROPion (WELLBUTRIN) 100 MG tablet Take 2 tablets (200 mg total) by mouth every morning 180 tablet 1   calcium carbonate-vitamin D3 (OS-CAL 500+D) 500 mg(1,227m) -400 unit tablet Take 1 tablet by mouth 4 (four) times daily.   cholecalciferol (VITAMIN D3) 1000 unit tablet Take by mouth   diclofenac (VOLTAREN) 1 % topical gel Apply 2 g topically 4 (four) times daily 100 g 1   escitalopram oxalate (LEXAPRO) 10 MG tablet Take 1 tablet (10 mg total) by mouth once daily for 90 days 90 tablet 1   etodolac (LODINE) 400 MG tablet Take 1 tablet (400 mg total) by mouth 2 (two) times daily 60 tablet 11   Herbal Supplement Herbal Name: ACV gummie and biotin   L.acid/L.casei/B.bif/B.lon/FOS (PROBIOTIC BLEND ORAL) Take by mouth   lancets Use 1 each 2 (two) times daily Use as instructed. 200 each 1   lisdexamfetamine (VYVANSE) 50 MG capsule Take 1 capsule (50 mg total) by mouth every morning 90 capsule 0   losartan-hydroCHLOROthiazide (HYZAAR) 100-12.5 mg tablet Take 1 tablet by mouth once daily 90 tablet 3   metFORMIN (GLUCOPHAGE) 500 MG tablet Take 1 tablet (500 mg total) by mouth 2 (two) times daily with meals Pear patient, now taking once daily 180 tablet 3   multivitamin tablet Take 2 tablets by mouth once daily Centrum multivitamin   omeprazole (PRILOSEC) 20 MG DR capsule Take 1 capsule (20 mg total) by mouth once daily 90 capsule 2   pen needle, diabetic (PEN NEEDLE) 30 gauge x 5/16" needle Use  as directed 100 each 3   tirzepatide (MOUNJARO) 12.5 mg/0.5 mL PnIj Inject 12.5 mg subcutaneously every 7 (seven) days 0.5 mL 5   tiZANidine (ZANAFLEX) 4 MG tablet Take 1 tablet (4 mg total) by mouth 3 (three) times daily 90 tablet 11   triamcinolone (NASACORT AQ) 55 mcg nasal spray Place 2 sprays into both nostrils once daily   triamterene-hydrochlorothiazide (DYAZIDE) 37.5-25 mg capsule Take 1 capsule by mouth every morning 30 capsule 5   Allergies:   Azithromycin Itching    Iodinated Contrast Media Rash   Past Medical History:   Abdominal wall cellulitis 2013 and 2014   Acute blood loss anemia 07/21/2019   Acute CVA (cerebrovascular accident) (CMS-HCC) 08/18/2019  Right carotid occlusion with right MCA distribution infarct, 12/20, revascularized with stent   Allergy 2000   Arthritis 2017 (rt knee traumatic)   Asthma, unspecified asthma severity, unspecified whether complicated, unspecified whether persistent 2000 (allergic)   B12 deficiency 06/19/2014   Controlled type 2 diabetes mellitus without complication, without long-term current use of insulin (CMS-HCC) 05/28/2017   Costochondritis   GERD (gastroesophageal reflux disease) 2017   History of abnormal cervical Pap smear 1998   History of Clostridium difficile colitis 06/19/2014   History of stroke   HTN (hypertension) 12/23/2013   Hx of abnormal cervical Pap smear   IBS (irritable bowel syndrome)   Migraine   Morbid obesity (CMS-HCC) 12/23/2013 (s/p attempted gastric banding now decompressed)   PCOS (polycystic ovarian syndrome) 12/23/2013   Past Surgical History:   TUBAL LIGATION Dec 2006 (Clamped)   gastric lap band 2012   EGD 05/05/2013 (Esophagitis: No repeat per PYO)   COLONOSCOPY 05/05/2013 (Normal Colon: CBF 04/2023)   EGD 12/06/2015 (Gastritis, Duodenitis: No repeat per RTE)   HERNIA REPAIR August 2017 (Dr. Rochel Brome)   [place] 54/65/6812 (Dr. Rochel Brome)   Excision of glomus tumor,right thumb 07/04/2018 (Dr. Roland Rack)   CAROTID ENDARTERECTOMY 11.24.2020   CAROTID STENT 07-15-19   cerebral sngiogram with stent   CESAREAN SECTION x 2   CHOLECYSTECTOMY   duedom switch   OVARY SURGERY X 2   TONSILLECTOMY   Family History:   High blood pressure (Hypertension) Mother   Depression Mother   Diabetes type II Mother   Glaucoma Mother   Hyperlipidemia (Elevated cholesterol) Mother   Obesity Mother   Thyroid disease Mother   Diabetes Mother   Diabetes type II Father    Myocardial Infarction (Heart attack) Father   Coronary Artery Disease (Blocked arteries around heart) Father   Obesity Father   Diabetes Father   Kidney disease Father   Breast cancer Maternal Grandmother   Colon cancer Maternal Grandmother   Hip fracture Maternal Grandmother   High blood pressure (Hypertension) Maternal Grandmother   Cancer Maternal Grandmother (Breast and colon)   Diabetes type II Paternal Grandmother   Coronary Artery Disease (Paternal Grandmother)   Diabetes Paternal Grandmother   Myocardial Infarction (Heart attack) Paternal Grandmother   Social History:   Socioeconomic History:   Marital status: Married  Tobacco Use   Smoking status: Never   Smokeless tobacco: Never   Tobacco comments:  Some second hand as a child  Vaping Use   Vaping Use: Never used  Substance and Sexual Activity   Alcohol use: Yes  Alcohol/week: 0.0 standard drinks  Comment: Less than once a week   Drug use: Never   Sexual activity: Yes  Partners: Male  Birth control/protection: Surgical  Social History Narrative  Married with  2 children.   Review of Systems:  A comprehensive 14 point ROS was performed, reviewed, and the pertinent orthopaedic findings are documented in the HPI.  Physical Exam: Vitals:  12/23/21 0853  BP: 118/78  Weight: (!) 107.8 kg (237 lb 9.6 oz)  Height: 162.6 cm (_0 )  PainSc: 3  PainLoc: Wrist   General/Constitutional: Pleasant overweight middle-aged female in no acute distress. Neuro/Psych: Normal mood and affect, oriented to person, place and time. Eyes: Non-icteric. Pupils are equal, round, and reactive to light, and exhibit synchronous movement. ENT: Unremarkable. Lymphatic: No palpable adenopathy. Respiratory: Lungs clear to auscultation, Normal chest excursion, No wheezes and Non-labored breathing Cardiovascular: Regular rate and rhythm. No murmurs. and No edema, swelling or tenderness, except as noted in detailed exam. Integumentary: No  impressive skin lesions present, except as noted in detailed exam. Musculoskeletal: Unremarkable, except as noted in detailed exam.  Right wrist/hand exam: Skin inspection of the right wrist and hand is unremarkable. No swelling, erythema, ecchymosis, abrasions, or other skin abnormalities are identified. There is no tenderness to palpation over the dorsal or palmar aspects of the wrist or hand. She exhibits full active and passive range of motion of the wrist without any pain or catching. She is able to actively flex and extend all digits fully without any pain or triggering. She is neurovascular intact to all digits of her right hand. She has a positive Phalen's test as well as an equivocally positive Tinel's sign over the carpal tunnel.  EMG results:  Results from an EMG performed last year confirmed the presence of moderate carpal tunnel syndrome in the right wrist. This report was reviewed by myself and discussed with the patient.  Assessment:  Carpal tunnel syndrome, right.   Plan: The treatment options were discussed with the patient. In addition, patient educational materials were provided regarding the diagnosis and treatment options. The patient is quite frustrated by her symptoms and function limitations, and is ready to consider more aggressive treatment options. Therefore, I have recommended a surgical procedure, specifically an endoscopic right carpal tunnel release. The procedure was discussed with the patient, as were the potential risks (including bleeding, infection, nerve and/or blood vessel injury, persistent or recurrent pain/paresthesias, weakness of grip, need for further surgery, blood clots, strokes, heart attacks and/or arhythmias, pneumonia, etc.) and benefits. The patient states her understanding and wishes to proceed. All of the patient's questions and concerns were answered. She can call any time with further concerns. She will follow up post-surgery, routine.   H&P  reviewed and patient re-examined. No changes.

## 2022-01-18 NOTE — Transfer of Care (Signed)
Immediate Anesthesia Transfer of Care Note  Patient: Kathryn Coffey  Procedure(s) Performed: CARPAL TUNNEL RELEASE ENDOSCOPIC (Right: Wrist)  Patient Location: PACU  Anesthesia Type:MAC, bier block  Level of Consciousness: drowsy  Airway & Oxygen Therapy: Patient Spontanous Breathing and Patient connected to face mask oxygen  Post-op Assessment: Report given to RN and Post -op Vital signs reviewed and stable  Post vital signs: Reviewed and stable  Last Vitals:  Vitals Value Taken Time  BP    Temp    Pulse    Resp    SpO2      Last Pain:  Vitals:   01/18/22 0656  TempSrc: Oral  PainSc: 3          Complications: No notable events documented.

## 2022-01-18 NOTE — Anesthesia Preprocedure Evaluation (Signed)
Anesthesia Evaluation  Patient identified by MRN, date of birth, ID band Patient awake    Reviewed: Allergy & Precautions, NPO status , Patient's Chart, lab work & pertinent test results  History of Anesthesia Complications (+) PONV and history of anesthetic complications  Airway Mallampati: III  TM Distance: <3 FB Neck ROM: full    Dental  (+) Chipped   Pulmonary asthma ,    Pulmonary exam normal        Cardiovascular Exercise Tolerance: Good hypertension, Normal cardiovascular exam     Neuro/Psych  Headaches,  Neuromuscular disease CVA (left side), Residual Symptoms negative psych ROS   GI/Hepatic Neg liver ROS, hiatal hernia, GERD  Controlled,  Endo/Other  diabetes, Type 2  Renal/GU negative Renal ROS  negative genitourinary   Musculoskeletal   Abdominal   Peds  Hematology negative hematology ROS (+)   Anesthesia Other Findings Past Medical History: 2013 and 2014: Abdominal wall cellulitis No date: Arthritis No date: Asthma     Comment:  allergic to grasses No date: B12 deficiency No date: Complication of anesthesia No date: Costochondritis No date: Diabetes mellitus without complication (HCC)     Comment:  type 2 No date: Gastric anomaly     Comment:  multiple small ulcers No date: GERD (gastroesophageal reflux disease) 06/2018: Herpes     Comment:  POSSIBLY IN EYE- PT TAKING VALTREX AND WILL SEE               OPTHAMOLOGIST TO SEE IF VALTREX IS WORKING No date: History of abnormal cervical Pap smear 2015: History of Clostridium difficile colitis No date: History of hiatal hernia No date: History of kidney stones No date: Hypertension No date: IBS (irritable bowel syndrome) No date: Migraine No date: Morbid obesity ([place])     Comment:  s/p attempted gastric banding now decompressed No date: PCOS (polycystic ovarian syndrome) No date: PONV (postoperative nausea and vomiting)     Comment:  WITH  SPINAL ONLY 07/2019: Stroke ([place])     Comment:  right MCA  Past Surgical History: No date: BREAST EXCISIONAL BIOPSY; Left     Comment:  cyst excision - Dr. Patty Sermons No date: CESAREAN SECTION     Comment:  x2 No date: CHOLECYSTECTOMY 2014: COLONOSCOPY 07/04/2018: EAR CYST EXCISION; Right     Comment:  Procedure: EXCISION GLOMUS TUMOR THUMBNAIL;  Surgeon:               Corky Mull, MD;  Location: [facility];  Service:               Orthopedics;  Laterality: Right; 2014: ESOPHAGOGASTRODUODENOSCOPY 12/06/2015: ESOPHAGOGASTRODUODENOSCOPY (EGD) WITH PROPOFOL; N/A     Comment:  Procedure: ESOPHAGOGASTRODUODENOSCOPY (EGD) WITH               PROPOFOL;  Surgeon: Manya Silvas, MD;  Location: [facility]              ENDOSCOPY;  Service: Endoscopy;  Laterality: N/A; 07/15/2019: IR ANGIO INTRA EXTRACRAN SEL INTERNAL CAROTID UNI L MOD  SED 07/15/2019: IR ANGIO VERTEBRAL SEL SUBCLAVIAN INNOMINATE UNI R MOD SED 07/15/2019: IR CT HEAD LTD 07/15/2019: IR INTRAVSC STENT CERV CAROTID W/O EMB-PROT MOD SED INC  ANGIO 07/15/2019: IR PERCUTANEOUS ART THROMBECTOMY/INFUSION INTRACRANIAL  INC DIAG ANGIO 2012: LAPAROSCOPIC GASTRIC BANDING 01/25/2016: LAPAROSCOPIC GASTRIC RESTRICTIVE DUODENAL PROCEDURE  (DUODENAL SWITCH); N/A     Comment:  Procedure: LAPAROSCOPIC GASTRIC RESTRICTIVE DUODENAL  PROCEDURE (DUODENAL SWITCH);  Surgeon: Ladora Daniel,               MD;  Location: [facility];  Service: General;  Laterality:               N/A; No date: OVARY SURGERY     Comment:  x2 07/15/2019: RADIOLOGY WITH ANESTHESIA; N/A     Comment:  Procedure: IR WITH ANESTHESIA;  Surgeon: Luanne Bras, MD;  Location: [place];  Service: Radiology;                Laterality: N/A; No date: TONSILLECTOMY 2006: TUBAL LIGATION 68/06/5725: UMBILICAL HERNIA REPAIR; N/A     Comment:  Procedure: LAPAROSCOPIC UMBILICAL HERNIA;  Surgeon:               Ladora Daniel, MD;  Location: [facility];   Service:               General;  Laterality: N/A; 20/35/5974: UMBILICAL HERNIA REPAIR; N/A     Comment:  Procedure: HERNIA REPAIR UMBILICAL ADULT;  Surgeon:               Leonie Green, MD;  Location: [facility];  Service:               General;  Laterality: N/A;  BMI    Body Mass Index: 40.87 kg/m      Reproductive/Obstetrics negative OB ROS                             Anesthesia Physical Anesthesia Plan  ASA: 3  Anesthesia Plan: General   Post-op Pain Management: Regional block*   Induction: Intravenous  PONV Risk Score and Plan: Propofol infusion and TIVA  Airway Management Planned: Natural Airway and Nasal Cannula  Additional Equipment:   Intra-op Plan:   Post-operative Plan:   Informed Consent: I have reviewed the patients History and Physical, chart, labs and discussed the procedure including the risks, benefits and alternatives for the proposed anesthesia with the patient or authorized representative who has indicated his/her understanding and acceptance.     Dental Advisory Given  Plan Discussed with: Anesthesiologist, CRNA and Surgeon  Anesthesia Plan Comments: (Patient requesting Bier block.  Patient informed that even with block that they are at risk for stroke do to exposure to other anesthetics to tolerate tourniquet pain.   Patient consented for risks of anesthesia including but not limited to:  - adverse reactions to medications - risk of airway placement if required - damage to eyes, teeth, lips or other oral mucosa - nerve damage due to positioning  - sore throat or hoarseness - Damage to heart, brain, nerves, lungs, other parts of body or loss of life  Patient voiced understanding.)        Anesthesia Quick Evaluation

## 2022-01-18 NOTE — Anesthesia Postprocedure Evaluation (Signed)
Anesthesia Post Note  Patient: BREALYNN CONTINO  Procedure(s) Performed: CARPAL TUNNEL RELEASE ENDOSCOPIC (Right: Wrist)  Patient location during evaluation: PACU Anesthesia Type: General Level of consciousness: awake and alert Pain management: pain level controlled Vital Signs Assessment: post-procedure vital signs reviewed and stable Respiratory status: spontaneous breathing, nonlabored ventilation, respiratory function stable and patient connected to nasal cannula oxygen Cardiovascular status: blood pressure returned to baseline and stable Postop Assessment: no apparent nausea or vomiting Anesthetic complications: no   No notable events documented.   Last Vitals:  Vitals:   01/18/22 0845 01/18/22 0850  BP: 110/76 128/87  Pulse: 66 67  Resp: 15 17  Temp: 36.4 C 36.6 C  SpO2: 96% 97%    Last Pain:  Vitals:   01/18/22 0850  TempSrc: Temporal  PainSc: 0-No pain                 Precious Haws Marquel Pottenger

## 2022-01-24 ENCOUNTER — Other Ambulatory Visit: Payer: Self-pay

## 2022-01-24 MED ORDER — TIZANIDINE HCL 4 MG PO TABS
ORAL_TABLET | ORAL | 11 refills | Status: DC
  Filled 2022-01-24: qty 90, 30d supply, fill #0
  Filled 2022-04-25: qty 90, 30d supply, fill #1
  Filled 2022-07-24: qty 90, 30d supply, fill #2
  Filled 2022-11-08: qty 90, 30d supply, fill #3
  Filled 2023-01-17: qty 90, 30d supply, fill #4

## 2022-01-24 MED ORDER — MOUNJARO 15 MG/0.5ML ~~LOC~~ SOAJ
SUBCUTANEOUS | 5 refills | Status: AC
  Filled 2022-01-24: qty 2, 28d supply, fill #0
  Filled 2022-03-01 – 2022-04-07 (×3): qty 2, 28d supply, fill #1
  Filled 2022-04-28: qty 2, 28d supply, fill #2

## 2022-01-24 MED ORDER — METFORMIN HCL 500 MG PO TABS
ORAL_TABLET | ORAL | 3 refills | Status: AC
  Filled 2022-01-24: qty 180, 90d supply, fill #0

## 2022-01-24 MED ORDER — ESCITALOPRAM OXALATE 10 MG PO TABS
ORAL_TABLET | ORAL | 0 refills | Status: DC
Start: 2022-01-24 — End: 2022-09-04
  Filled 2022-01-24: qty 90, 90d supply, fill #0

## 2022-01-24 MED ORDER — OMEPRAZOLE 20 MG PO CPDR
DELAYED_RELEASE_CAPSULE | ORAL | 2 refills | Status: DC
  Filled 2022-01-24: qty 90, 90d supply, fill #0
  Filled 2022-05-07: qty 90, 90d supply, fill #1
  Filled 2022-08-07: qty 90, 90d supply, fill #2

## 2022-01-25 ENCOUNTER — Other Ambulatory Visit: Payer: Self-pay

## 2022-02-01 ENCOUNTER — Other Ambulatory Visit: Payer: Self-pay

## 2022-02-16 ENCOUNTER — Other Ambulatory Visit: Payer: Self-pay

## 2022-02-17 ENCOUNTER — Other Ambulatory Visit: Payer: Self-pay

## 2022-02-17 MED ORDER — VYVANSE 50 MG PO CAPS
ORAL_CAPSULE | ORAL | 0 refills | Status: AC
  Filled 2022-02-27: qty 90, 90d supply, fill #0

## 2022-02-20 ENCOUNTER — Other Ambulatory Visit: Payer: Self-pay

## 2022-02-27 ENCOUNTER — Other Ambulatory Visit: Payer: Self-pay

## 2022-02-27 MED ORDER — VYVANSE 50 MG PO CAPS
ORAL_CAPSULE | ORAL | 0 refills | Status: AC
  Filled 2022-02-27: qty 90, 90d supply, fill #0

## 2022-03-01 ENCOUNTER — Other Ambulatory Visit: Payer: Self-pay

## 2022-03-03 ENCOUNTER — Other Ambulatory Visit: Payer: Self-pay

## 2022-03-13 ENCOUNTER — Other Ambulatory Visit: Payer: Self-pay

## 2022-03-14 ENCOUNTER — Other Ambulatory Visit: Payer: Self-pay

## 2022-03-14 MED ORDER — MOUNJARO 12.5 MG/0.5ML ~~LOC~~ SOAJ
SUBCUTANEOUS | 5 refills | Status: AC
  Filled 2022-03-14: qty 2, 28d supply, fill #0
  Filled 2022-04-07: qty 2, 28d supply, fill #1

## 2022-03-15 ENCOUNTER — Encounter: Payer: Self-pay | Admitting: Surgery

## 2022-03-20 ENCOUNTER — Other Ambulatory Visit: Payer: Self-pay

## 2022-03-21 ENCOUNTER — Other Ambulatory Visit: Payer: Self-pay

## 2022-03-27 ENCOUNTER — Other Ambulatory Visit: Payer: Self-pay

## 2022-04-03 ENCOUNTER — Other Ambulatory Visit: Payer: Self-pay

## 2022-04-07 ENCOUNTER — Other Ambulatory Visit: Payer: Self-pay

## 2022-04-13 ENCOUNTER — Other Ambulatory Visit: Payer: Self-pay

## 2022-04-13 MED ORDER — DIFLUPREDNATE 0.05 % OP EMUL
OPHTHALMIC | 0 refills | Status: AC
  Filled 2022-04-13: qty 5, 10d supply, fill #0

## 2022-04-25 ENCOUNTER — Other Ambulatory Visit: Payer: Self-pay

## 2022-04-26 ENCOUNTER — Other Ambulatory Visit: Payer: Self-pay

## 2022-04-26 MED ORDER — DOXYCYCLINE HYCLATE 100 MG PO CAPS
100.0000 mg | ORAL_CAPSULE | Freq: Two times a day (BID) | ORAL | 0 refills | Status: DC
  Filled 2022-04-26: qty 14, 7d supply, fill #0

## 2022-04-26 MED ORDER — PREDNISONE 20 MG PO TABS
ORAL_TABLET | ORAL | 0 refills | Status: DC
  Filled 2022-04-26: qty 10, 10d supply, fill #0

## 2022-04-28 ENCOUNTER — Other Ambulatory Visit: Payer: Self-pay

## 2022-05-01 ENCOUNTER — Other Ambulatory Visit: Payer: Self-pay

## 2022-05-05 ENCOUNTER — Other Ambulatory Visit: Payer: Self-pay

## 2022-05-05 MED ORDER — OXYCODONE-ACETAMINOPHEN 5-325 MG PO TABS
ORAL_TABLET | ORAL | 0 refills | Status: AC
  Filled 2022-05-05: qty 9, 3d supply, fill #0

## 2022-05-05 MED ORDER — TAMSULOSIN HCL 0.4 MG PO CAPS
ORAL_CAPSULE | ORAL | 0 refills | Status: AC
  Filled 2022-05-05: qty 10, 10d supply, fill #0

## 2022-05-07 ENCOUNTER — Other Ambulatory Visit: Payer: Self-pay

## 2022-05-08 ENCOUNTER — Other Ambulatory Visit: Payer: Self-pay

## 2022-05-09 ENCOUNTER — Other Ambulatory Visit: Payer: Self-pay

## 2022-05-09 MED ORDER — MOUNJARO 7.5 MG/0.5ML ~~LOC~~ SOAJ
SUBCUTANEOUS | 5 refills | Status: AC
  Filled 2022-05-09 – 2022-05-10 (×3): qty 2, 28d supply, fill #0

## 2022-05-09 MED ORDER — ESCITALOPRAM OXALATE 10 MG PO TABS
10.0000 mg | ORAL_TABLET | Freq: Every day | ORAL | 3 refills | Status: DC
Start: 2022-05-09 — End: 2022-09-04
  Filled 2022-05-09: qty 90, 90d supply, fill #0
  Filled 2022-08-07: qty 90, 90d supply, fill #1

## 2022-05-10 ENCOUNTER — Other Ambulatory Visit: Payer: Self-pay

## 2022-05-10 MED ORDER — MOUNJARO 10 MG/0.5ML ~~LOC~~ SOAJ
SUBCUTANEOUS | 5 refills | Status: DC
  Filled 2022-05-11: qty 2, 28d supply, fill #0

## 2022-05-11 ENCOUNTER — Other Ambulatory Visit: Payer: Self-pay

## 2022-05-12 ENCOUNTER — Other Ambulatory Visit: Payer: Self-pay

## 2022-05-22 ENCOUNTER — Other Ambulatory Visit: Payer: Self-pay

## 2022-05-23 ENCOUNTER — Other Ambulatory Visit: Payer: Self-pay

## 2022-05-23 MED ORDER — LISDEXAMFETAMINE DIMESYLATE 60 MG PO CAPS
ORAL_CAPSULE | ORAL | 0 refills | Status: DC
  Filled 2022-05-23: qty 30, 30d supply, fill #0

## 2022-05-23 MED ORDER — OZEMPIC (0.25 OR 0.5 MG/DOSE) 2 MG/3ML ~~LOC~~ SOPN
0.5000 mg | PEN_INJECTOR | SUBCUTANEOUS | 5 refills | Status: DC
  Filled 2022-05-23: qty 3, 28d supply, fill #0
  Filled 2022-06-22: qty 3, 28d supply, fill #1

## 2022-05-23 MED ORDER — METFORMIN HCL 500 MG PO TABS
ORAL_TABLET | ORAL | 3 refills | Status: AC
  Filled 2022-05-23 – 2022-12-20 (×2): qty 360, 90d supply, fill #0

## 2022-06-01 ENCOUNTER — Other Ambulatory Visit: Payer: Self-pay

## 2022-06-01 MED ORDER — AMPHETAMINE-DEXTROAMPHETAMINE 20 MG PO TABS
ORAL_TABLET | ORAL | 0 refills | Status: DC
  Filled 2022-06-01: qty 60, 30d supply, fill #0

## 2022-06-02 ENCOUNTER — Other Ambulatory Visit: Payer: Self-pay

## 2022-06-07 ENCOUNTER — Other Ambulatory Visit: Payer: Self-pay

## 2022-06-07 MED ORDER — BUPROPION HCL 100 MG PO TABS
ORAL_TABLET | ORAL | 1 refills | Status: DC
  Filled 2022-06-07: qty 160, 80d supply, fill #0
  Filled 2022-06-07: qty 20, 10d supply, fill #0

## 2022-06-19 ENCOUNTER — Other Ambulatory Visit: Payer: Self-pay

## 2022-06-19 MED ORDER — LISDEXAMFETAMINE DIMESYLATE 60 MG PO CAPS
ORAL_CAPSULE | ORAL | 0 refills | Status: DC
  Filled 2022-06-19: qty 90, 90d supply, fill #0

## 2022-06-20 ENCOUNTER — Other Ambulatory Visit: Payer: Self-pay

## 2022-06-21 ENCOUNTER — Other Ambulatory Visit: Payer: Self-pay

## 2022-06-22 ENCOUNTER — Other Ambulatory Visit: Payer: Self-pay

## 2022-06-22 MED ORDER — OZEMPIC (1 MG/DOSE) 4 MG/3ML ~~LOC~~ SOPN
PEN_INJECTOR | SUBCUTANEOUS | 11 refills | Status: AC
  Filled 2022-06-22 – 2022-07-20 (×2): qty 3, 28d supply, fill #0
  Filled 2022-08-09: qty 3, 28d supply, fill #1

## 2022-07-07 ENCOUNTER — Other Ambulatory Visit: Payer: Self-pay

## 2022-07-07 MED ORDER — AMPHETAMINE-DEXTROAMPHETAMINE 10 MG PO TABS
ORAL_TABLET | ORAL | 0 refills | Status: AC
  Filled 2022-07-07: qty 60, 30d supply, fill #0

## 2022-07-07 MED ORDER — AMPHETAMINE-DEXTROAMPHETAMINE 10 MG PO TABS
10.0000 mg | ORAL_TABLET | Freq: Two times a day (BID) | ORAL | 0 refills | Status: AC
  Filled 2022-11-08: qty 60, 30d supply, fill #0

## 2022-07-07 MED ORDER — AMPHETAMINE-DEXTROAMPHETAMINE 10 MG PO TABS: ORAL_TABLET | ORAL | 0 refills | Status: DC

## 2022-07-10 ENCOUNTER — Other Ambulatory Visit: Payer: Self-pay | Admitting: Neurology

## 2022-07-10 DIAGNOSIS — I6521 Occlusion and stenosis of right carotid artery: Secondary | ICD-10-CM

## 2022-07-12 ENCOUNTER — Ambulatory Visit
Admission: RE | Admit: 2022-07-12 | Discharge: 2022-07-12 | Disposition: A | Discharge status: Home / Self Care | Payer: No Typology Code available for payment source | Source: Ambulatory Visit | Attending: Neurology | Admitting: Neurology

## 2022-07-12 DIAGNOSIS — I6521 Occlusion and stenosis of right carotid artery: Secondary | ICD-10-CM | POA: Diagnosis not present

## 2022-07-20 ENCOUNTER — Other Ambulatory Visit: Payer: Self-pay

## 2022-07-24 ENCOUNTER — Other Ambulatory Visit: Payer: Self-pay

## 2022-07-25 ENCOUNTER — Other Ambulatory Visit: Payer: Self-pay

## 2022-07-25 MED ORDER — TRIAMTERENE-HCTZ 37.5-25 MG PO CAPS
1.0000 | ORAL_CAPSULE | Freq: Every morning | ORAL | 3 refills | Status: AC
  Filled 2022-07-25: qty 90, 90d supply, fill #0

## 2022-07-25 MED ORDER — TRIAMTERENE-HCTZ 37.5-25 MG PO CAPS
1.0000 | ORAL_CAPSULE | Freq: Every morning | ORAL | 5 refills | Status: AC
  Filled 2022-07-25: qty 30, 30d supply, fill #0
  Filled 2022-08-23: qty 30, 30d supply, fill #1
  Filled 2022-09-14: qty 30, 30d supply, fill #2
  Filled 2022-10-17: qty 30, 30d supply, fill #3
  Filled 2022-11-21: qty 30, 30d supply, fill #4
  Filled 2022-12-19: qty 30, 30d supply, fill #5

## 2022-07-26 ENCOUNTER — Other Ambulatory Visit: Payer: Self-pay

## 2022-08-02 ENCOUNTER — Other Ambulatory Visit: Payer: Self-pay

## 2022-08-02 MED ORDER — PREDNISONE 20 MG PO TABS
ORAL_TABLET | ORAL | 0 refills | Status: AC
  Filled 2022-08-02: qty 12, 8d supply, fill #0

## 2022-08-02 MED ORDER — AMOXICILLIN-POT CLAVULANATE 875-125 MG PO TABS
ORAL_TABLET | ORAL | 0 refills | Status: AC
  Filled 2022-08-02: qty 14, 7d supply, fill #0

## 2022-08-07 ENCOUNTER — Other Ambulatory Visit: Payer: Self-pay

## 2022-08-09 ENCOUNTER — Other Ambulatory Visit: Payer: Self-pay

## 2022-08-23 ENCOUNTER — Other Ambulatory Visit: Payer: Self-pay

## 2022-09-01 NOTE — Progress Notes (Unsigned)
MRN : [id number]  Kathryn Coffey is a 52 y.o. (Oct 31, 1970) female who presents with chief complaint of check carotid arteries.  History of Present Illness:   The patient is seen for evaluation of carotid stenosis. The carotid stenosis was noted after an ultrasound on 07/12/2022.  This study showed occlusion of the RICA and 7-86% LICA.  The RICA occlusion was also identified by CTA 01/09/2020.    She is s/p RICA stent remotely.  The patient denies amaurosis fugax. There is no recent history of TIA symptoms or focal motor deficits. There is no prior documented CVA.  There is no history of migraine headaches. There is no history of seizures.  The patient is taking enteric-coated aspirin 81 mg daily.  No recent shortening of the patient's walking distance or new symptoms consistent with claudication.  No history of rest pain symptoms. No new ulcers or wounds of the lower extremities have occurred.  There is no history of DVT, PE or superficial thrombophlebitis. No recent episodes of angina or shortness of breath documented.   No outpatient medications have been marked as taking for the 09/04/22 encounter (Appointment) with Delana Meyer, Dolores Lory, MD.    Past Medical History:  Diagnosis Date   Abdominal wall cellulitis 2013 and 2014   Arthritis    Asthma    allergic to grasses   B12 deficiency    Complication of anesthesia    Costochondritis    Diabetes mellitus without complication ([place])    type 2   Gastric anomaly    multiple small ulcers   GERD (gastroesophageal reflux disease)    Herpes 06/2018   POSSIBLY IN EYE- PT TAKING VALTREX AND WILL SEE OPTHAMOLOGIST TO SEE IF VALTREX IS WORKING   History of abnormal cervical Pap smear    History of Clostridium difficile colitis 2015   History of hiatal hernia    History of kidney stones    Hypertension    IBS (irritable bowel syndrome)    Migraine    Morbid obesity ([place])    s/p attempted gastric banding now decompressed   PCOS  (polycystic ovarian syndrome)    PONV (postoperative nausea and vomiting)    WITH SPINAL ONLY   Stroke ([place]) 07/2019   right MCA    Past Surgical History:  Procedure Laterality Date   BREAST EXCISIONAL BIOPSY Left    cyst excision - Dr. Patty Sermons   CARPAL TUNNEL RELEASE Right 01/18/2022   Procedure: CARPAL TUNNEL RELEASE ENDOSCOPIC;  Surgeon: Corky Mull, MD;  Location: [facility] ORS;  Service: Orthopedics;  Laterality: Right;   CESAREAN SECTION     x2   CHOLECYSTECTOMY     COLONOSCOPY  2014   EAR CYST EXCISION Right 07/04/2018   Procedure: EXCISION GLOMUS TUMOR THUMBNAIL;  Surgeon: Corky Mull, MD;  Location: [facility] ORS;  Service: Orthopedics;  Laterality: Right;   ESOPHAGOGASTRODUODENOSCOPY  2014   ESOPHAGOGASTRODUODENOSCOPY (EGD) WITH PROPOFOL N/A 12/06/2015   Procedure: ESOPHAGOGASTRODUODENOSCOPY (EGD) WITH PROPOFOL;  Surgeon: Manya Silvas, MD;  Location: [facility] ENDOSCOPY;  Service: Endoscopy;  Laterality: N/A;   IR ANGIO INTRA EXTRACRAN SEL INTERNAL CAROTID UNI L MOD SED  07/15/2019   IR ANGIO VERTEBRAL SEL SUBCLAVIAN INNOMINATE UNI R MOD SED  07/15/2019   IR CT HEAD LTD  07/15/2019   IR INTRAVSC STENT CERV CAROTID W/O EMB-PROT MOD SED INC ANGIO  07/15/2019   IR PERCUTANEOUS ART THROMBECTOMY/INFUSION INTRACRANIAL INC DIAG ANGIO  07/15/2019   LAPAROSCOPIC GASTRIC BANDING  2012   LAPAROSCOPIC GASTRIC RESTRICTIVE DUODENAL PROCEDURE (DUODENAL SWITCH) N/A 01/25/2016   Procedure: LAPAROSCOPIC GASTRIC RESTRICTIVE DUODENAL PROCEDURE (DUODENAL SWITCH);  Surgeon: Ladora Daniel, MD;  Location: [facility] ORS;  Service: General;  Laterality: N/A;   OVARY SURGERY     x2   RADIOLOGY WITH ANESTHESIA N/A 07/15/2019   Procedure: IR WITH ANESTHESIA;  Surgeon: Luanne Bras, MD;  Location: [place];  Service: Radiology;  Laterality: N/A;   TONSILLECTOMY     TUBAL LIGATION  3016   UMBILICAL HERNIA REPAIR N/A 01/25/2016   Procedure: LAPAROSCOPIC UMBILICAL HERNIA;  Surgeon: Ladora Daniel, MD;   Location: [facility] ORS;  Service: General;  Laterality: N/A;   UMBILICAL HERNIA REPAIR N/A 10/20/2016   Procedure: HERNIA REPAIR UMBILICAL ADULT;  Surgeon: Leonie Green, MD;  Location: [facility] ORS;  Service: General;  Laterality: N/A;    Social History Social History   Tobacco Use   Smoking status: Never   Smokeless tobacco: Never  Vaping Use   Vaping Use: Never used  Substance Use Topics   Alcohol use: Yes    Alcohol/week: 0.0 standard drinks of alcohol    Comment: SOCIAL   Drug use: No    Family History Family History  Problem Relation Age of Onset   Breast cancer Maternal Grandmother 63   Diabetes Mother    Diabetes Father    Heart attack Father     Allergies  Allergen Reactions   Codeine Other (See Comments)    Headaches and hot flashes   Azithromycin Rash    Abdominal wall abcess   Ivp Dye [Iodinated Contrast Media] Hives   Vicodin [Hydrocodone-Acetaminophen] Other (See Comments)    migraine     REVIEW OF SYSTEMS (Negative unless checked)  Constitutional: '[]'$ Weight loss  '[]'$ Fever  '[]'$ Chills Cardiac: '[]'$ Chest pain   '[]'$ Chest pressure   '[]'$ Palpitations   '[]'$ Shortness of breath when laying flat   '[]'$ Shortness of breath with exertion. Vascular:  '[x]'$ Pain in legs with walking   '[]'$ Pain in legs at rest  '[]'$ History of DVT   '[]'$ Phlebitis   '[]'$ Swelling in legs   '[]'$ Varicose veins   '[]'$ Non-healing ulcers Pulmonary:   '[]'$ Uses home oxygen   '[]'$ Productive cough   '[]'$ Hemoptysis   '[]'$ Wheeze  '[]'$ COPD   '[]'$ Asthma Neurologic:  '[]'$ Dizziness   '[]'$ Seizures   '[]'$ History of stroke   '[]'$ History of TIA  '[]'$ Aphasia   '[]'$ Vissual changes   '[]'$ Weakness or numbness in arm   '[]'$ Weakness or numbness in leg Musculoskeletal:   '[]'$ Joint swelling   '[]'$ Joint pain   '[]'$ Low back pain Hematologic:  '[]'$ Easy bruising  '[]'$ Easy bleeding   '[]'$ Hypercoagulable state   '[]'$ Anemic Gastrointestinal:  '[]'$ Diarrhea   '[]'$ Vomiting  '[]'$ Gastroesophageal reflux/heartburn   '[]'$ Difficulty swallowing. Genitourinary:  '[]'$ Chronic kidney disease   '[]'$ Difficult  urination  '[]'$ Frequent urination   '[]'$ Blood in urine Skin:  '[]'$ Rashes   '[]'$ Ulcers  Psychological:  '[]'$ History of anxiety   '[]'$  History of major depression.  Physical Examination  There were no vitals filed for this visit. There is no height or weight on file to calculate BMI. Gen: WD/WN, NAD Head: [place]/AT, No temporalis wasting.  Ear/Nose/Throat: Hearing grossly intact, nares w/o erythema or drainage Eyes: PER, EOMI, sclera nonicteric.  Neck: Supple, no masses.  No bruit or JVD.  Pulmonary:  Good air movement, no audible wheezing, no use of accessory muscles.  Cardiac: RRR, normal S1, S2, no Murmurs. Vascular:  carotid bruit noted Vessel Right Left  Radial Palpable Palpable  Carotid  Palpable  Palpable  Subclav  Palpable Palpable  Gastrointestinal: soft, non-distended. No guarding/no peritoneal signs.  Musculoskeletal: M/S 5/5 throughout.  No visible deformity.  Neurologic: CN 2-12 intact. Pain and light touch intact in extremities.  Symmetrical.  Speech is fluent. Motor exam as listed above. Psychiatric: Judgment intact, Mood & affect appropriate for pt's clinical situation. Dermatologic: No rashes or ulcers noted.  No changes consistent with cellulitis.   CBC Lab Results  Component Value Date   WBC 9.3 01/29/2020   HGB 13.9 01/18/2022   HCT 41.0 01/18/2022   MCV 80.9 01/29/2020   PLT 484 (H) 01/29/2020    BMET    Component Value Date/Time   NA 141 01/18/2022 0642   NA 140 09/29/2012 0536   K 4.5 01/18/2022 0642   K 3.9 09/29/2012 0536   CL 103 01/18/2022 0642   CL 109 (H) 09/29/2012 0536   CO2 26 01/29/2020 1642   CO2 25 09/29/2012 0536   GLUCOSE 113 (H) 01/18/2022 0642   GLUCOSE 105 (H) 09/29/2012 0536   BUN 33 (H) 01/18/2022 0642   BUN 11 09/29/2012 0536   CREATININE 0.70 01/18/2022 0642   CREATININE 0.71 09/29/2012 0536   CALCIUM 9.4 01/29/2020 1642   CALCIUM 8.4 (L) 09/29/2012 0536   GFRNONAA >60 01/29/2020 1642   GFRNONAA >60 09/29/2012 0536   GFRAA >60  01/29/2020 1642   GFRAA >60 09/29/2012 0536   CrCl cannot be calculated (Patient's most recent lab result is older than the maximum 21 days allowed.).  COAG Lab Results  Component Value Date   INR  07/15/2019    QUESTIONABLE RESULTS, RECOMMEND RECOLLECT TO VERIFY    Radiology No results found.   Assessment/Plan There are no diagnoses linked to this encounter.   Hortencia Pilar, MD  09/01/2022 4:08 PM

## 2022-09-04 ENCOUNTER — Ambulatory Visit (INDEPENDENT_AMBULATORY_CARE_PROVIDER_SITE_OTHER): Payer: Commercial Managed Care - PPO | Admitting: Vascular Surgery

## 2022-09-04 ENCOUNTER — Encounter (INDEPENDENT_AMBULATORY_CARE_PROVIDER_SITE_OTHER): Payer: Self-pay | Admitting: Vascular Surgery

## 2022-09-04 VITALS — BP 131/80 | HR 86 | Ht 64.0 in | Wt 238.0 lb

## 2022-09-04 DIAGNOSIS — E119 Type 2 diabetes mellitus without complications: Secondary | ICD-10-CM

## 2022-09-04 DIAGNOSIS — I773 Arterial fibromuscular dysplasia: Secondary | ICD-10-CM | POA: Diagnosis not present

## 2022-09-04 DIAGNOSIS — I7771 Dissection of carotid artery: Secondary | ICD-10-CM | POA: Diagnosis not present

## 2022-09-04 DIAGNOSIS — I10 Essential (primary) hypertension: Secondary | ICD-10-CM | POA: Diagnosis not present

## 2022-09-07 ENCOUNTER — Telehealth (INDEPENDENT_AMBULATORY_CARE_PROVIDER_SITE_OTHER): Payer: Self-pay | Admitting: Vascular Surgery

## 2022-09-07 NOTE — Telephone Encounter (Signed)
Spoke with Kathryn Coffey to advise no prior auth required for testing that Dr. Delana Meyer ordered. I advised to call Radiology scheduling to make an appt and then call us back at the office to make a CT results appt with Dr. Delana Meyer. Kathryn Coffey acknowledged.

## 2022-09-11 ENCOUNTER — Other Ambulatory Visit: Payer: Self-pay

## 2022-09-11 MED ORDER — MOUNJARO 10 MG/0.5ML ~~LOC~~ SOAJ
SUBCUTANEOUS | 5 refills | Status: AC
  Filled 2022-09-11: qty 2, 28d supply, fill #0
  Filled 2022-11-15: qty 2, 28d supply, fill #1
  Filled 2023-01-08: qty 2, 28d supply, fill #2

## 2022-09-14 ENCOUNTER — Other Ambulatory Visit: Payer: Self-pay

## 2022-09-14 MED ORDER — AMPHETAMINE-DEXTROAMPHETAMINE 20 MG PO TABS
20.0000 mg | ORAL_TABLET | Freq: Two times a day (BID) | ORAL | 0 refills | Status: AC
  Filled 2022-09-14: qty 60, 30d supply, fill #0

## 2022-09-14 MED ORDER — ETODOLAC 400 MG PO TABS
400.0000 mg | ORAL_TABLET | Freq: Two times a day (BID) | ORAL | 11 refills | Status: AC
  Filled 2022-09-14: qty 60, 30d supply, fill #0
  Filled 2022-11-21: qty 60, 30d supply, fill #1
  Filled 2023-06-18: qty 60, 30d supply, fill #2
  Filled 2023-08-30: qty 60, 30d supply, fill #3

## 2022-09-15 ENCOUNTER — Other Ambulatory Visit: Payer: Self-pay

## 2022-09-15 ENCOUNTER — Encounter (INDEPENDENT_AMBULATORY_CARE_PROVIDER_SITE_OTHER): Payer: Self-pay | Admitting: Vascular Surgery

## 2022-09-15 NOTE — Telephone Encounter (Signed)
Please call the patient in a contrast protocol and 1 mg xanax (1 tab)

## 2022-09-18 ENCOUNTER — Other Ambulatory Visit: Payer: Self-pay

## 2022-09-18 MED ORDER — ALPRAZOLAM 1 MG PO TABS
1.0000 mg | ORAL_TABLET | Freq: Every day | ORAL | 0 refills | Status: AC
  Filled 2022-09-18: qty 1, 1d supply, fill #0

## 2022-09-18 MED ORDER — DIPHENHYDRAMINE HCL 25 MG PO CAPS: 25.0000 mg | ORAL_CAPSULE | ORAL | 0 refills | Status: AC

## 2022-09-18 MED ORDER — PREDNISONE 50 MG PO TABS
ORAL_TABLET | ORAL | 0 refills | Status: AC
  Filled 2022-09-18: qty 3, 2d supply, fill #0

## 2022-09-18 MED ORDER — FAMOTIDINE 10 MG PO TABS: 10.0000 mg | ORAL_TABLET | ORAL | 0 refills | Status: AC

## 2022-09-19 ENCOUNTER — Other Ambulatory Visit: Payer: Self-pay

## 2022-09-19 MED ORDER — AMPHETAMINE-DEXTROAMPHETAMINE 20 MG PO TABS
20.0000 mg | ORAL_TABLET | Freq: Every day | ORAL | 0 refills | Status: DC
  Filled 2022-09-19 – 2022-11-21 (×2): qty 90, 90d supply, fill #0

## 2022-09-19 MED ORDER — LISDEXAMFETAMINE DIMESYLATE 60 MG PO CAPS
60.0000 mg | ORAL_CAPSULE | Freq: Every morning | ORAL | 0 refills | Status: DC
  Filled 2022-09-21: qty 90, 90d supply, fill #0

## 2022-09-21 ENCOUNTER — Other Ambulatory Visit: Payer: Self-pay

## 2022-09-25 ENCOUNTER — Ambulatory Visit
Admission: RE | Admit: 2022-09-25 | Discharge: 2022-09-25 | Disposition: A | Discharge status: Home / Self Care | Payer: Commercial Managed Care - PPO | Source: Ambulatory Visit | Attending: Vascular Surgery | Admitting: Vascular Surgery

## 2022-09-25 DIAGNOSIS — I771 Stricture of artery: Secondary | ICD-10-CM | POA: Diagnosis not present

## 2022-09-25 DIAGNOSIS — I773 Arterial fibromuscular dysplasia: Secondary | ICD-10-CM

## 2022-09-25 DIAGNOSIS — I7771 Dissection of carotid artery: Secondary | ICD-10-CM

## 2022-09-25 DIAGNOSIS — I6521 Occlusion and stenosis of right carotid artery: Secondary | ICD-10-CM | POA: Diagnosis not present

## 2022-09-25 MED ORDER — GADOBUTROL 1 MMOL/ML IV SOLN
10.0000 mL | Freq: Once | INTRAVENOUS | Status: AC | PRN
  Administered 2022-09-25: 10 mL via INTRAVENOUS

## 2022-10-23 DIAGNOSIS — M2041 Other hammer toe(s) (acquired), right foot: Secondary | ICD-10-CM | POA: Diagnosis not present

## 2022-10-23 DIAGNOSIS — Q6672 Congenital pes cavus, left foot: Secondary | ICD-10-CM | POA: Diagnosis not present

## 2022-10-23 DIAGNOSIS — E119 Type 2 diabetes mellitus without complications: Secondary | ICD-10-CM | POA: Diagnosis not present

## 2022-10-23 DIAGNOSIS — M2042 Other hammer toe(s) (acquired), left foot: Secondary | ICD-10-CM | POA: Diagnosis not present

## 2022-10-23 DIAGNOSIS — Z8673 Personal history of transient ischemic attack (TIA), and cerebral infarction without residual deficits: Secondary | ICD-10-CM | POA: Diagnosis not present

## 2022-10-23 DIAGNOSIS — Q6671 Congenital pes cavus, right foot: Secondary | ICD-10-CM | POA: Diagnosis not present

## 2022-10-23 DIAGNOSIS — M79672 Pain in left foot: Secondary | ICD-10-CM | POA: Diagnosis not present

## 2022-10-23 DIAGNOSIS — I739 Peripheral vascular disease, unspecified: Secondary | ICD-10-CM | POA: Diagnosis not present

## 2022-10-23 DIAGNOSIS — Z6839 Body mass index (BMI) 39.0-39.9, adult: Secondary | ICD-10-CM | POA: Diagnosis not present

## 2022-11-06 ENCOUNTER — Other Ambulatory Visit (INDEPENDENT_AMBULATORY_CARE_PROVIDER_SITE_OTHER): Payer: Self-pay | Admitting: Podiatry

## 2022-11-06 DIAGNOSIS — I739 Peripheral vascular disease, unspecified: Secondary | ICD-10-CM

## 2022-11-07 ENCOUNTER — Ambulatory Visit (INDEPENDENT_AMBULATORY_CARE_PROVIDER_SITE_OTHER): Payer: Commercial Managed Care - PPO

## 2022-11-07 DIAGNOSIS — I739 Peripheral vascular disease, unspecified: Secondary | ICD-10-CM

## 2022-11-08 ENCOUNTER — Other Ambulatory Visit: Payer: Self-pay

## 2022-11-08 MED ORDER — PREDNISONE 20 MG PO TABS
ORAL_TABLET | ORAL | 0 refills | Status: DC
  Filled 2022-11-08: qty 12, 8d supply, fill #0

## 2022-11-09 ENCOUNTER — Other Ambulatory Visit: Payer: Self-pay

## 2022-11-09 MED ORDER — OMEPRAZOLE 20 MG PO CPDR
20.0000 mg | DELAYED_RELEASE_CAPSULE | Freq: Every day | ORAL | 2 refills | Status: AC
  Filled 2022-11-09: qty 90, 90d supply, fill #0
  Filled 2023-02-07: qty 90, 90d supply, fill #1
  Filled 2023-05-07: qty 90, 90d supply, fill #2

## 2022-11-16 DIAGNOSIS — Z0000 Encounter for general adult medical examination without abnormal findings: Secondary | ICD-10-CM | POA: Diagnosis not present

## 2022-11-21 ENCOUNTER — Other Ambulatory Visit: Payer: Self-pay

## 2022-11-23 ENCOUNTER — Other Ambulatory Visit: Payer: Self-pay

## 2022-11-23 DIAGNOSIS — Z0000 Encounter for general adult medical examination without abnormal findings: Secondary | ICD-10-CM | POA: Diagnosis not present

## 2022-11-23 DIAGNOSIS — F902 Attention-deficit hyperactivity disorder, combined type: Secondary | ICD-10-CM | POA: Diagnosis not present

## 2022-11-23 DIAGNOSIS — E538 Deficiency of other specified B group vitamins: Secondary | ICD-10-CM | POA: Diagnosis not present

## 2022-11-23 DIAGNOSIS — E119 Type 2 diabetes mellitus without complications: Secondary | ICD-10-CM | POA: Diagnosis not present

## 2022-11-23 MED ORDER — BUPROPION HCL 100 MG PO TABS
200.0000 mg | ORAL_TABLET | Freq: Every morning | ORAL | 3 refills | Status: AC
  Filled 2022-11-23: qty 180, 90d supply, fill #0
  Filled 2023-03-19: qty 180, 90d supply, fill #1
  Filled 2023-06-04 – 2023-06-18 (×2): qty 180, 90d supply, fill #2

## 2022-11-23 MED ORDER — MOUNJARO 10 MG/0.5ML ~~LOC~~ SOAJ
10.0000 mg | SUBCUTANEOUS | 5 refills | Status: DC
  Filled 2022-11-23 – 2022-12-25 (×2): qty 2, 28d supply, fill #0

## 2022-11-23 MED ORDER — ETODOLAC 400 MG PO TABS
400.0000 mg | ORAL_TABLET | Freq: Two times a day (BID) | ORAL | 3 refills | Status: AC
  Filled 2022-11-23 – 2023-08-30 (×3): qty 180, 90d supply, fill #0

## 2022-11-23 MED ORDER — OMEPRAZOLE 20 MG PO CPDR
20.0000 mg | DELAYED_RELEASE_CAPSULE | Freq: Every day | ORAL | 2 refills | Status: DC
  Filled 2022-11-23: qty 90, 90d supply, fill #0

## 2022-11-23 MED ORDER — LOSARTAN POTASSIUM-HCTZ 100-12.5 MG PO TABS
1.0000 | ORAL_TABLET | Freq: Every day | ORAL | 3 refills | Status: AC
  Filled 2022-11-23: qty 90, 90d supply, fill #0
  Filled 2023-02-07: qty 90, 90d supply, fill #1
  Filled 2023-05-07: qty 90, 90d supply, fill #2
  Filled 2023-07-16 – 2023-08-18 (×2): qty 90, 90d supply, fill #3

## 2022-11-23 MED ORDER — ESCITALOPRAM OXALATE 10 MG PO TABS
10.0000 mg | ORAL_TABLET | Freq: Every day | ORAL | 3 refills | Status: AC
  Filled 2022-11-23 – 2023-02-09 (×2): qty 90, 90d supply, fill #0
  Filled 2023-05-07: qty 90, 90d supply, fill #1
  Filled 2023-08-18: qty 90, 90d supply, fill #2

## 2022-12-11 ENCOUNTER — Other Ambulatory Visit: Payer: Self-pay

## 2022-12-11 DIAGNOSIS — M778 Other enthesopathies, not elsewhere classified: Secondary | ICD-10-CM | POA: Diagnosis not present

## 2022-12-11 MED ORDER — PREDNISONE 10 MG PO TABS
10.0000 mg | ORAL_TABLET | Freq: Every day | ORAL | 0 refills | Status: DC
  Filled 2022-12-11: qty 14, 14d supply, fill #0

## 2022-12-19 ENCOUNTER — Other Ambulatory Visit: Payer: Self-pay

## 2022-12-19 DIAGNOSIS — D801 Nonfamilial hypogammaglobulinemia: Secondary | ICD-10-CM | POA: Diagnosis not present

## 2022-12-19 DIAGNOSIS — Q6672 Congenital pes cavus, left foot: Secondary | ICD-10-CM | POA: Diagnosis not present

## 2022-12-20 ENCOUNTER — Other Ambulatory Visit: Payer: Self-pay

## 2022-12-20 MED ORDER — TRIAMTERENE-HCTZ 37.5-25 MG PO CAPS
1.0000 | ORAL_CAPSULE | Freq: Every morning | ORAL | 3 refills | Status: AC
  Filled 2022-12-20: qty 90, 90d supply, fill #0

## 2022-12-20 MED ORDER — LISDEXAMFETAMINE DIMESYLATE 60 MG PO CAPS
60.0000 mg | ORAL_CAPSULE | Freq: Every morning | ORAL | 0 refills | Status: DC
  Filled 2022-12-20: qty 90, 90d supply, fill #0

## 2022-12-20 MED ORDER — METFORMIN HCL 500 MG PO TABS
1000.0000 mg | ORAL_TABLET | Freq: Two times a day (BID) | ORAL | 3 refills | Status: AC
  Filled 2022-12-20 – 2023-06-18 (×2): qty 360, 90d supply, fill #0

## 2022-12-21 ENCOUNTER — Other Ambulatory Visit: Payer: Self-pay

## 2022-12-25 ENCOUNTER — Other Ambulatory Visit: Payer: Self-pay

## 2023-01-05 ENCOUNTER — Other Ambulatory Visit: Payer: Self-pay

## 2023-01-05 MED ORDER — MOUNJARO 12.5 MG/0.5ML ~~LOC~~ SOAJ
12.5000 mg | SUBCUTANEOUS | 5 refills | Status: DC
  Filled 2023-01-05 – 2023-01-17 (×2): qty 2, 28d supply, fill #0
  Filled 2023-02-09: qty 2, 28d supply, fill #1

## 2023-01-08 ENCOUNTER — Other Ambulatory Visit: Payer: Self-pay

## 2023-01-17 ENCOUNTER — Other Ambulatory Visit: Payer: Self-pay

## 2023-01-17 MED ORDER — TRIAMTERENE-HCTZ 37.5-25 MG PO CAPS
1.0000 | ORAL_CAPSULE | Freq: Every morning | ORAL | 3 refills | Status: AC
  Filled 2023-01-17: qty 90, 90d supply, fill #0
  Filled 2023-04-16: qty 90, 90d supply, fill #1

## 2023-02-09 ENCOUNTER — Other Ambulatory Visit: Payer: Self-pay

## 2023-02-09 MED ORDER — ESCITALOPRAM OXALATE 10 MG PO TABS
10.0000 mg | ORAL_TABLET | Freq: Every day | ORAL | 1 refills | Status: DC
  Filled 2023-08-30 – 2023-11-23 (×2): qty 90, 90d supply, fill #0

## 2023-02-09 MED ORDER — AMPHETAMINE-DEXTROAMPHETAMINE 20 MG PO TABS
20.0000 mg | ORAL_TABLET | Freq: Every day | ORAL | 0 refills | Status: AC
  Filled 2023-02-09: qty 90, 90d supply, fill #0

## 2023-02-23 ENCOUNTER — Other Ambulatory Visit: Payer: Self-pay

## 2023-02-23 DIAGNOSIS — M5481 Occipital neuralgia: Secondary | ICD-10-CM | POA: Diagnosis not present

## 2023-02-23 MED ORDER — PREDNISONE 10 MG PO TABS
ORAL_TABLET | ORAL | 0 refills | Status: DC
  Filled 2023-02-23: qty 20, 8d supply, fill #0

## 2023-02-27 ENCOUNTER — Other Ambulatory Visit: Payer: Self-pay

## 2023-02-27 MED ORDER — MOUNJARO 10 MG/0.5ML ~~LOC~~ SOAJ
10.0000 mg | SUBCUTANEOUS | 5 refills | Status: DC
  Filled 2023-02-27: qty 2, 28d supply, fill #0
  Filled 2023-04-03: qty 2, 28d supply, fill #1
  Filled 2023-05-07: qty 2, 28d supply, fill #2
  Filled 2023-06-04: qty 2, 28d supply, fill #3
  Filled 2023-07-16: qty 2, 28d supply, fill #4
  Filled 2023-08-30: qty 2, 28d supply, fill #5

## 2023-03-05 ENCOUNTER — Other Ambulatory Visit: Payer: Self-pay

## 2023-03-19 ENCOUNTER — Other Ambulatory Visit: Payer: Self-pay

## 2023-03-19 MED ORDER — LISDEXAMFETAMINE DIMESYLATE 60 MG PO CAPS
60.0000 mg | ORAL_CAPSULE | Freq: Every morning | ORAL | 0 refills | Status: AC
  Filled 2023-03-20: qty 90, 90d supply, fill #0

## 2023-03-20 ENCOUNTER — Other Ambulatory Visit: Payer: Self-pay

## 2023-03-26 ENCOUNTER — Other Ambulatory Visit: Payer: Self-pay | Admitting: Oncology

## 2023-03-26 DIAGNOSIS — Z006 Encounter for examination for normal comparison and control in clinical research program: Secondary | ICD-10-CM

## 2023-04-03 ENCOUNTER — Other Ambulatory Visit: Payer: Self-pay

## 2023-04-09 ENCOUNTER — Other Ambulatory Visit: Payer: Self-pay

## 2023-04-09 DIAGNOSIS — K219 Gastro-esophageal reflux disease without esophagitis: Secondary | ICD-10-CM | POA: Diagnosis not present

## 2023-04-09 DIAGNOSIS — Z1211 Encounter for screening for malignant neoplasm of colon: Secondary | ICD-10-CM | POA: Diagnosis not present

## 2023-04-09 MED ORDER — NA SULFATE-K SULFATE-MG SULF 17.5-3.13-1.6 GM/177ML PO SOLN
ORAL | 0 refills | Status: DC
  Filled 2023-04-09: qty 354, 1d supply, fill #0

## 2023-04-16 ENCOUNTER — Other Ambulatory Visit: Payer: Self-pay

## 2023-04-16 MED ORDER — TIZANIDINE HCL 4 MG PO TABS
4.0000 mg | ORAL_TABLET | Freq: Three times a day (TID) | ORAL | 11 refills | Status: DC
  Filled 2023-04-16: qty 90, 30d supply, fill #0
  Filled 2023-06-18: qty 90, 30d supply, fill #1
  Filled 2023-08-30: qty 90, 30d supply, fill #2
  Filled 2023-11-23: qty 90, 30d supply, fill #3
  Filled 2024-02-20: qty 90, 30d supply, fill #4
  Filled 2024-04-02 (×2): qty 90, 30d supply, fill #5

## 2023-04-18 ENCOUNTER — Other Ambulatory Visit: Payer: Self-pay

## 2023-04-18 MED ORDER — TRIAMTERENE-HCTZ 37.5-25 MG PO CAPS
1.0000 | ORAL_CAPSULE | Freq: Every morning | ORAL | 3 refills | Status: DC
  Filled 2023-04-18 – 2023-07-16 (×3): qty 90, 90d supply, fill #0
  Filled 2023-08-30 – 2023-10-12 (×3): qty 90, 90d supply, fill #1

## 2023-05-07 ENCOUNTER — Other Ambulatory Visit: Payer: Self-pay

## 2023-05-07 DIAGNOSIS — Z03818 Encounter for observation for suspected exposure to other biological agents ruled out: Secondary | ICD-10-CM | POA: Diagnosis not present

## 2023-05-18 DIAGNOSIS — E119 Type 2 diabetes mellitus without complications: Secondary | ICD-10-CM | POA: Diagnosis not present

## 2023-05-18 DIAGNOSIS — E538 Deficiency of other specified B group vitamins: Secondary | ICD-10-CM | POA: Diagnosis not present

## 2023-05-25 ENCOUNTER — Other Ambulatory Visit: Payer: Self-pay

## 2023-05-25 DIAGNOSIS — E119 Type 2 diabetes mellitus without complications: Secondary | ICD-10-CM | POA: Diagnosis not present

## 2023-05-25 DIAGNOSIS — G8194 Hemiplegia, unspecified affecting left nondominant side: Secondary | ICD-10-CM | POA: Diagnosis not present

## 2023-05-25 DIAGNOSIS — Z23 Encounter for immunization: Secondary | ICD-10-CM | POA: Diagnosis not present

## 2023-05-25 MED ORDER — AMPHETAMINE-DEXTROAMPHETAMINE 20 MG PO TABS
20.0000 mg | ORAL_TABLET | Freq: Every day | ORAL | 0 refills | Status: DC
  Filled 2023-05-25: qty 90, 90d supply, fill #0

## 2023-05-25 MED ORDER — BUPROPION HCL 100 MG PO TABS
100.0000 mg | ORAL_TABLET | Freq: Three times a day (TID) | ORAL | 3 refills | Status: AC
  Filled 2023-05-25: qty 270, 90d supply, fill #0
  Filled 2023-10-12: qty 270, 90d supply, fill #1

## 2023-05-30 ENCOUNTER — Other Ambulatory Visit: Payer: Self-pay

## 2023-06-04 ENCOUNTER — Other Ambulatory Visit: Payer: Self-pay

## 2023-06-11 DIAGNOSIS — D2271 Melanocytic nevi of right lower limb, including hip: Secondary | ICD-10-CM | POA: Diagnosis not present

## 2023-06-11 DIAGNOSIS — D2261 Melanocytic nevi of right upper limb, including shoulder: Secondary | ICD-10-CM | POA: Diagnosis not present

## 2023-06-11 DIAGNOSIS — L281 Prurigo nodularis: Secondary | ICD-10-CM | POA: Diagnosis not present

## 2023-06-11 DIAGNOSIS — D2262 Melanocytic nevi of left upper limb, including shoulder: Secondary | ICD-10-CM | POA: Diagnosis not present

## 2023-06-11 DIAGNOSIS — D225 Melanocytic nevi of trunk: Secondary | ICD-10-CM | POA: Diagnosis not present

## 2023-06-11 DIAGNOSIS — L821 Other seborrheic keratosis: Secondary | ICD-10-CM | POA: Diagnosis not present

## 2023-06-11 DIAGNOSIS — D2272 Melanocytic nevi of left lower limb, including hip: Secondary | ICD-10-CM | POA: Diagnosis not present

## 2023-06-14 ENCOUNTER — Encounter: Payer: Self-pay | Admitting: *Deleted

## 2023-06-18 ENCOUNTER — Other Ambulatory Visit: Payer: Self-pay

## 2023-06-18 ENCOUNTER — Encounter: Payer: Self-pay | Admitting: *Deleted

## 2023-06-19 ENCOUNTER — Other Ambulatory Visit: Payer: Self-pay

## 2023-06-19 MED ORDER — AMPHETAMINE-DEXTROAMPHETAMINE 20 MG PO TABS
20.0000 mg | ORAL_TABLET | Freq: Every day | ORAL | 0 refills | Status: DC
  Filled 2023-08-30: qty 90, 90d supply, fill #0

## 2023-06-19 MED ORDER — LISDEXAMFETAMINE DIMESYLATE 60 MG PO CAPS
60.0000 mg | ORAL_CAPSULE | Freq: Every morning | ORAL | 0 refills | Status: DC
  Filled 2023-06-19: qty 90, 90d supply, fill #0

## 2023-06-19 MED ORDER — METFORMIN HCL 500 MG PO TABS
1000.0000 mg | ORAL_TABLET | Freq: Two times a day (BID) | ORAL | 3 refills | Status: AC
  Filled 2023-06-19 – 2023-12-24 (×3): qty 360, 90d supply, fill #0

## 2023-06-25 ENCOUNTER — Ambulatory Visit: Payer: Commercial Managed Care - PPO | Admitting: Anesthesiology

## 2023-06-25 ENCOUNTER — Encounter
Admission: RE | Disposition: A | Discharge status: Home / Self Care | Payer: Self-pay | Source: Home / Self Care | Attending: Gastroenterology

## 2023-06-25 ENCOUNTER — Encounter: Payer: Self-pay | Admitting: *Deleted

## 2023-06-25 ENCOUNTER — Ambulatory Visit
Admission: RE | Admit: 2023-06-25 | Discharge: 2023-06-25 | Disposition: A | Discharge status: Home / Self Care | Payer: Commercial Managed Care - PPO | Attending: Gastroenterology | Admitting: Gastroenterology

## 2023-06-25 DIAGNOSIS — I10 Essential (primary) hypertension: Secondary | ICD-10-CM | POA: Diagnosis not present

## 2023-06-25 DIAGNOSIS — Z8673 Personal history of transient ischemic attack (TIA), and cerebral infarction without residual deficits: Secondary | ICD-10-CM | POA: Insufficient documentation

## 2023-06-25 DIAGNOSIS — Z8619 Personal history of other infectious and parasitic diseases: Secondary | ICD-10-CM | POA: Insufficient documentation

## 2023-06-25 DIAGNOSIS — E119 Type 2 diabetes mellitus without complications: Secondary | ICD-10-CM | POA: Diagnosis not present

## 2023-06-25 DIAGNOSIS — Z7984 Long term (current) use of oral hypoglycemic drugs: Secondary | ICD-10-CM | POA: Diagnosis not present

## 2023-06-25 DIAGNOSIS — Z9049 Acquired absence of other specified parts of digestive tract: Secondary | ICD-10-CM | POA: Diagnosis not present

## 2023-06-25 DIAGNOSIS — Z1211 Encounter for screening for malignant neoplasm of colon: Secondary | ICD-10-CM | POA: Diagnosis not present

## 2023-06-25 DIAGNOSIS — K640 First degree hemorrhoids: Secondary | ICD-10-CM | POA: Insufficient documentation

## 2023-06-25 DIAGNOSIS — Z6841 Body Mass Index (BMI) 40.0 and over, adult: Secondary | ICD-10-CM | POA: Insufficient documentation

## 2023-06-25 DIAGNOSIS — Z7985 Long-term (current) use of injectable non-insulin antidiabetic drugs: Secondary | ICD-10-CM | POA: Insufficient documentation

## 2023-06-25 DIAGNOSIS — K649 Unspecified hemorrhoids: Secondary | ICD-10-CM | POA: Diagnosis not present

## 2023-06-25 HISTORY — PX: COLONOSCOPY WITH PROPOFOL: SHX5780

## 2023-06-25 LAB — POCT PREGNANCY, URINE: Preg Test, Ur: NEGATIVE

## 2023-06-25 LAB — GLUCOSE, CAPILLARY: Glucose-Capillary: 120 mg/dL — ABNORMAL HIGH (ref 70–99)

## 2023-06-25 SURGERY — COLONOSCOPY WITH PROPOFOL
Anesthesia: General

## 2023-06-25 MED ORDER — PROPOFOL 1000 MG/100ML IV EMUL
INTRAVENOUS | Status: AC
  Filled 2023-06-25: qty 100

## 2023-06-25 MED ORDER — PROPOFOL 500 MG/50ML IV EMUL
INTRAVENOUS | Status: DC | PRN
  Administered 2023-06-25: 150 ug/kg/min via INTRAVENOUS

## 2023-06-25 MED ORDER — SODIUM CHLORIDE 0.9 % IV SOLN: INTRAVENOUS | Status: DC

## 2023-06-25 MED ORDER — PROPOFOL 10 MG/ML IV BOLUS
INTRAVENOUS | Status: DC | PRN
  Administered 2023-06-25: 100 mg via INTRAVENOUS

## 2023-06-25 NOTE — Op Note (Signed)
[facility] Gastroenterology Patient Name: Laisha Rau Procedure Date: 06/25/2023 8:37 AM MRN: [id number] Account #: [id number] Date of Birth: 1971-04-08 Admit Type: Outpatient Age: 52 Room: [facility] ENDO ROOM 3 Gender: Female Note Status: Finalized Instrument Name: Nelda Marseille [id number] Procedure:             Colonoscopy Indications:           Screening for colorectal malignant neoplasm Providers:             Eather Colas MD, MD Referring MD:          Danella Penton, MD (Referring MD) Medicines:             Monitored Anesthesia Care Complications:         No immediate complications. Procedure:             Pre-Anesthesia Assessment:                        - Prior to the procedure, a History and Physical was                         performed, and patient medications and allergies were                         reviewed. The patient is competent. The risks and                         benefits of the procedure and the sedation options and                         risks were discussed with the patient. All questions                         were answered and informed consent was obtained.                         Patient identification and proposed procedure were                         verified by the physician, the nurse, the                         anesthesiologist, the anesthetist and the technician                         in the endoscopy suite. Mental Status Examination:                         alert and oriented. Airway Examination: normal                         oropharyngeal airway and neck mobility. Respiratory                         Examination: clear to auscultation. CV Examination:                         normal. Prophylactic Antibiotics: The patient does not  require prophylactic antibiotics. Prior                         Anticoagulants: The patient has taken no anticoagulant                         or antiplatelet agents. ASA Grade  Assessment: III - A                         patient with severe systemic disease. After reviewing                         the risks and benefits, the patient was deemed in                         satisfactory condition to undergo the procedure. The                         anesthesia plan was to use monitored anesthesia care                         (MAC). Immediately prior to administration of                         medications, the patient was re-assessed for adequacy                         to receive sedatives. The heart rate, respiratory                         rate, oxygen saturations, blood pressure, adequacy of                         pulmonary ventilation, and response to care were                         monitored throughout the procedure. The physical                         status of the patient was re-assessed after the                         procedure.                        After obtaining informed consent, the colonoscope was                         passed under direct vision. Throughout the procedure,                         the patient's blood pressure, pulse, and oxygen                         saturations were monitored continuously. The                         Colonoscope was introduced through the anus and  advanced to the the cecum, identified by appendiceal                         orifice and ileocecal valve. The colonoscopy was                         somewhat difficult due to significant looping.                         Successful completion of the procedure was aided by                         applying abdominal pressure. The patient tolerated the                         procedure well. The quality of the bowel preparation                         was good. The ileocecal valve, appendiceal orifice,                         and rectum were photographed. Findings:      The perianal and digital rectal examinations were normal.      Internal  hemorrhoids were found during retroflexion. The hemorrhoids       were Grade I (internal hemorrhoids that do not prolapse).      The exam was otherwise without abnormality on direct and retroflexion       views. Impression:            - Internal hemorrhoids.                        - The examination was otherwise normal on direct and                         retroflexion views.                        - No specimens collected. Recommendation:        - Discharge patient to home.                        - Resume previous diet.                        - Continue present medications.                        - Repeat colonoscopy in 10 years for screening                         purposes.                        - Return to referring physician as previously                         scheduled. Procedure Code(s):     --- Professional ---                        W9604, Colorectal  cancer screening; colonoscopy on                         individual not meeting criteria for high risk Diagnosis Code(s):     --- Professional ---                        Z12.11, Encounter for screening for malignant neoplasm                         of colon                        K64.0, First degree hemorrhoids CPT copyright 2022 American Medical Association. All rights reserved. The codes documented in this report are preliminary and upon coder review may  be revised to meet current compliance requirements. Eather Colas MD, MD 06/25/2023 9:24:45 AM Number of Addenda: 0 Note Initiated On: 06/25/2023 8:37 AM Scope Withdrawal Time: 0 hours 8 minutes 59 seconds  Total Procedure Duration: 0 hours 15 minutes 45 seconds  Estimated Blood Loss:  Estimated blood loss: none.      [facility]

## 2023-06-25 NOTE — Anesthesia Postprocedure Evaluation (Signed)
Anesthesia Post Note  Patient: Kathryn Coffey  Procedure(s) Performed: COLONOSCOPY WITH PROPOFOL  Patient location during evaluation: PACU Anesthesia Type: General Level of consciousness: awake and alert Pain management: pain level controlled Vital Signs Assessment: post-procedure vital signs reviewed and stable Respiratory status: spontaneous breathing, nonlabored ventilation, respiratory function stable and patient connected to nasal cannula oxygen Cardiovascular status: blood pressure returned to baseline and stable Postop Assessment: no apparent nausea or vomiting Anesthetic complications: no   There were no known notable events for this encounter.   Last Vitals:  Vitals:   06/25/23 0924 06/25/23 0934  BP: 113/83 117/84  Pulse: 79 71  Resp: 20 17  Temp:    SpO2: 98% 98%    Last Pain:  Vitals:   06/25/23 0934  TempSrc:   PainSc: 0-No pain                 Louie Boston

## 2023-06-25 NOTE — Anesthesia Preprocedure Evaluation (Addendum)
Anesthesia Evaluation  Patient identified by MRN, date of birth, ID band Patient awake    Reviewed: Allergy & Precautions, NPO status , Patient's Chart, lab work & pertinent test results  History of Anesthesia Complications (+) history of anesthetic complications  Airway Mallampati: III  TM Distance: >3 FB Neck ROM: full    Dental no notable dental hx.    Pulmonary asthma    Pulmonary exam normal        Cardiovascular hypertension, On Medications Normal cardiovascular exam     Neuro/Psych  Headaches R ICA occlusion with failed stent per patient, lightheadedness with sudden neck movements   Neuromuscular disease CVA  negative psych ROS   GI/Hepatic Neg liver ROS, hiatal hernia,GERD  ,,  Endo/Other  diabetes  Morbid obesity (BMI 41)  Renal/GU negative Renal ROS  negative genitourinary   Musculoskeletal  (+) Arthritis ,    Abdominal   Peds  Hematology  (+) Blood dyscrasia, anemia   Anesthesia Other Findings Past Medical History: 2013 and 2014: Abdominal wall cellulitis No date: Arthritis No date: Asthma     Comment:  allergic to grasses No date: B12 deficiency No date: Complication of anesthesia No date: Costochondritis No date: Diabetes mellitus without complication ([facility])     Comment:  type 2 No date: Gastric anomaly     Comment:  multiple small ulcers No date: GERD (gastroesophageal reflux disease) 06/2018: Herpes     Comment:  POSSIBLY IN EYE- PT TAKING VALTREX AND WILL SEE               OPTHAMOLOGIST TO SEE IF VALTREX IS WORKING No date: History of abnormal cervical Pap smear 2015: History of Clostridium difficile colitis No date: History of hiatal hernia No date: History of kidney stones No date: Hypertension No date: IBS (irritable bowel syndrome) No date: Migraine No date: Morbid obesity ([facility])     Comment:  s/p attempted gastric banding now decompressed No date: PCOS (polycystic ovarian  syndrome) No date: PONV (postoperative nausea and vomiting)     Comment:  WITH SPINAL ONLY 07/2019: Stroke ([facility])     Comment:  right MCA  Past Surgical History: No date: BREAST EXCISIONAL BIOPSY; Left     Comment:  cyst excision - Dr. Madelin Headings 01/18/2022: CARPAL TUNNEL RELEASE; Right     Comment:  Procedure: CARPAL TUNNEL RELEASE ENDOSCOPIC;  Surgeon:               Christena Flake, MD;  Location: [facility] ORS;  Service:               Orthopedics;  Laterality: Right; No date: CESAREAN SECTION     Comment:  x2 No date: CHOLECYSTECTOMY 2014: COLONOSCOPY No date: CORONARY ANGIOPLASTY 07/04/2018: EAR CYST EXCISION; Right     Comment:  Procedure: EXCISION GLOMUS TUMOR THUMBNAIL;  Surgeon:               Christena Flake, MD;  Location: [facility] ORS;  Service:               Orthopedics;  Laterality: Right; 2014: ESOPHAGOGASTRODUODENOSCOPY 12/06/2015: ESOPHAGOGASTRODUODENOSCOPY (EGD) WITH PROPOFOL; N/A     Comment:  Procedure: ESOPHAGOGASTRODUODENOSCOPY (EGD) WITH               PROPOFOL;  Surgeon: Scot Jun, MD;  Location: [facility]              ENDOSCOPY;  Service: Endoscopy;  Laterality: N/A; 07/15/2019: IR ANGIO INTRA EXTRACRAN SEL INTERNAL CAROTID UNI L  MOD  SED 07/15/2019: IR ANGIO VERTEBRAL SEL SUBCLAVIAN INNOMINATE UNI R MOD SED 07/15/2019: IR CT HEAD LTD 07/15/2019: IR INTRAVSC STENT CERV CAROTID W/O EMB-PROT MOD SED INC  ANGIO 07/15/2019: IR PERCUTANEOUS ART THROMBECTOMY/INFUSION INTRACRANIAL  INC DIAG ANGIO 2012: LAPAROSCOPIC GASTRIC BANDING 01/25/2016: LAPAROSCOPIC GASTRIC RESTRICTIVE DUODENAL PROCEDURE  (DUODENAL SWITCH); N/A     Comment:  Procedure: LAPAROSCOPIC GASTRIC RESTRICTIVE DUODENAL               PROCEDURE (DUODENAL SWITCH);  Surgeon: Everette Rank,               MD;  Location: [facility] ORS;  Service: General;  Laterality:               N/A; No date: OVARY SURGERY     Comment:  x2 07/15/2019: RADIOLOGY WITH ANESTHESIA; N/A     Comment:  Procedure: IR WITH ANESTHESIA;   Surgeon: Julieanne Cotton, MD;  Location: [facility] OR;  Service: Radiology;                Laterality: N/A; No date: TONSILLECTOMY 2006: TUBAL LIGATION 01/25/2016: UMBILICAL HERNIA REPAIR; N/A     Comment:  Procedure: LAPAROSCOPIC UMBILICAL HERNIA;  Surgeon:               Everette Rank, MD;  Location: [facility] ORS;  Service:               General;  Laterality: N/A; 10/20/2016: UMBILICAL HERNIA REPAIR; N/A     Comment:  Procedure: HERNIA REPAIR UMBILICAL ADULT;  Surgeon:               Nadeen Landau, MD;  Location: [facility] ORS;  Service:               General;  Laterality: N/A;     Reproductive/Obstetrics negative OB ROS                             Anesthesia Physical Anesthesia Plan  ASA: 3  Anesthesia Plan: General   Post-op Pain Management: Minimal or no pain anticipated   Induction: Intravenous  PONV Risk Score and Plan: 3 and Propofol infusion and TIVA  Airway Management Planned: Natural Airway and Nasal Cannula  Additional Equipment:   Intra-op Plan:   Post-operative Plan:   Informed Consent: I have reviewed the patients History and Physical, chart, labs and discussed the procedure including the risks, benefits and alternatives for the proposed anesthesia with the patient or authorized representative who has indicated his/her understanding and acceptance.     Dental Advisory Given  Plan Discussed with: Anesthesiologist, CRNA and Surgeon  Anesthesia Plan Comments: (Patient consented for risks of anesthesia including but not limited to:  - adverse reactions to medications - risk of airway placement if required - damage to eyes, teeth, lips or other oral mucosa - nerve damage due to positioning  - sore throat or hoarseness - Damage to heart, brain, nerves, lungs, other parts of body or loss of life  Patient voiced understanding and assent.)        Anesthesia Quick Evaluation

## 2023-06-25 NOTE — Transfer of Care (Signed)
Immediate Anesthesia Transfer of Care Note  Patient: Kathryn Coffey  Procedure(s) Performed: COLONOSCOPY WITH PROPOFOL  Patient Location: PACU  Anesthesia Type:General  Level of Consciousness: awake, alert , and sedated  Airway & Oxygen Therapy: Patient Spontanous Breathing and Patient connected to face mask oxygen  Post-op Assessment: Report given to RN and Post -op Vital signs reviewed and stable  Post vital signs: Reviewed and stable  Last Vitals:  Vitals Value Taken Time  BP    Temp    Pulse    Resp    SpO2      Last Pain:  Vitals:   06/25/23 0831  TempSrc: Temporal         Complications: There were no known notable events for this encounter.

## 2023-06-25 NOTE — Interval H&P Note (Signed)
History and Physical Interval Note:  06/25/2023 8:43 AM  Kathryn Coffey  has presented today for surgery, with the diagnosis of colon cancer screening.  The various methods of treatment have been discussed with the patient and family. After consideration of risks, benefits and other options for treatment, the patient has consented to  Procedure(s) with comments: COLONOSCOPY WITH PROPOFOL (N/A) - DM as a surgical intervention.  The patient's history has been reviewed, patient examined, no change in status, stable for surgery.  I have reviewed the patient's chart and labs.  Questions were answered to the patient's satisfaction.     Regis Bill  Ok to proceed with colonoscopy

## 2023-06-25 NOTE — H&P (Signed)
Outpatient short stay form Pre-procedure 06/25/2023  Regis Bill, MD  Primary Physician: Danella Penton, MD  Reason for visit:  Screening  History of present illness:    52 y/o lady with history of obesity, arthritis, and DM II here for screening colonoscopy. Looks like she had a colonoscopy in 2014 but no report available. No blood thinners. No family history of Gi malignancies. History of duodenal switch and cholecystectomy.    Current Facility-Administered Medications:    0.9 %  sodium chloride infusion, , Intravenous, Continuous, Arleen Bar, Rossie Muskrat, MD  Medications Prior to Admission  Medication Sig Dispense Refill Last Dose   amphetamine-dextroamphetamine (ADDERALL) 20 MG tablet Take 1 tablet (20 mg total) by mouth 2 (two) times daily. 60 tablet 0 06/24/2023   buPROPion (WELLBUTRIN) 100 MG tablet Take 2 tablets (200 mg total) by mouth every morning. 180 tablet 3 06/24/2023   Cholecalciferol (VITAMIN D3 PO) Take 1 tablet by mouth daily.   Past Week   etodolac (LODINE) 400 MG tablet Take 1 tablet (400 mg total) by mouth daily as needed.   06/24/2023   etodolac (LODINE) 400 MG tablet Take 1 tablet (400 mg total) by mouth 2 (two) times daily. 60 tablet 11 Past Week   lisdexamfetamine (VYVANSE) 60 MG capsule Take 1 capsule (60 mg total) by mouth every morning. 90 capsule 0 06/25/2023   metFORMIN (GLUCOPHAGE) 500 MG tablet Take 1 tablet (500 mg total) by mouth 2 (two) times daily with meals Pear patient, now taking once daily 180 tablet 3 06/25/2023   omeprazole (PRILOSEC) 20 MG capsule Take 1 capsule (20 mg total) by mouth daily. 90 capsule 2 06/24/2023   tirzepatide (MOUNJARO) 10 MG/0.5ML Pen Inject 10 mg into the skin once a week. 2 mL 5 06/18/2023   tirzepatide (MOUNJARO) 12.5 MG/0.5ML Pen Inject 12.5 mg subcutaneously every 7 (seven) days 2 mL 5 06/18/2023   tirzepatide (MOUNJARO) 15 MG/0.5ML Pen Inject 15 mg into the skin once a week. Wednesday   06/18/2023   tirzepatide  (MOUNJARO) 15 MG/0.5ML Pen Inject 15 mg subcutaneously every 7 (seven) days 2 mL 5 06/18/2023   tirzepatide (MOUNJARO) 7.5 MG/0.5ML Pen Inject 0.5 mLs (7.5 mg total) subcutaneously every 7 (seven) days 2 mL 5 06/18/2023   tiZANidine (ZANAFLEX) 4 MG tablet Take 1 tablet (4 mg total) by mouth 3 (three) times daily. 90 tablet 11 06/24/2023   triamterene-hydrochlorothiazide (DYAZIDE) 37.5-25 MG capsule Take 1 capsule by mouth every morning 90 capsule 3 06/25/2023   ALPRAZolam (XANAX) 1 MG tablet Take 1 tablet (1 mg total) by mouth day of procedure. (Patient not taking: Reported on 06/25/2023) 1 tablet 0 Not Taking   amoxicillin-clavulanate (AUGMENTIN) 875-125 MG tablet Take 1 tablet (875 mg total) by mouth every 12 (twelve) hours for 7 days 14 tablet 0    amphetamine-dextroamphetamine (ADDERALL) 10 MG tablet Take 1 tablet (10 mg total) by mouth 2 (two) times daily. 60 tablet 0    amphetamine-dextroamphetamine (ADDERALL) 10 MG tablet Take 1 tablet (10 mg total) by mouth 2 (two) times daily for 30 days 60 tablet 0    amphetamine-dextroamphetamine (ADDERALL) 20 MG tablet Take 1 tablet (20 mg total) by mouth daily. 90 tablet 0    amphetamine-dextroamphetamine (ADDERALL) 20 MG tablet Take 1 tablet (20 mg total) by mouth daily. 90 tablet 0    aspirin 81 MG chewable tablet Chew 1 tablet (81 mg total) by mouth daily.      Blood Glucose Monitoring Suppl (FREESTYLE FREEDOM LITE) w/Device  KIT as directed 1 kit 0    buPROPion (WELLBUTRIN) 100 MG tablet Take 2 tablets (200 mg total) by mouth every morning 180 tablet 1    buPROPion (WELLBUTRIN) 100 MG tablet Take 1 tablet by mouth every morning and 2 tablets at suppertime. 270 tablet 3    Calcium Carb-Cholecalciferol (CALCIUM + VITAMIN D3 PO) Take 1 tablet by mouth daily.      diclofenac Sodium (VOLTAREN) 1 % GEL Apply 2 g topically as needed.      Difluprednate (DUREZOL) 0.05 % EMUL Apply 1 drop into affected eye four times a day 5 mL 0    diphenhydrAMINE (BENADRYL) 25  mg capsule Take 1 capsule (25 mg total) by mouth day before procedure and one capsule day of procedure. 2 capsule 0    escitalopram (LEXAPRO) 10 MG tablet Take 1 tablet (10 mg total) by mouth at bedtime.      escitalopram (LEXAPRO) 10 MG tablet Take 1 tablet (10 mg total) by mouth once daily 90 tablet 3    escitalopram (LEXAPRO) 10 MG tablet Take 1 tablet (10 mg total) by mouth daily. 90 tablet 1    etodolac (LODINE) 400 MG tablet Take 1 tablet (400 mg total) by mouth 2 (two) times daily. 180 tablet 3    famotidine (PEPCID) 10 MG tablet Take 1 tablet (10 mg total) by mouth day of procedure. 1 tablet 0    ferrous gluconate (FERGON) 324 MG tablet TAKE 1 TABLET BY MOUTH DAILY WITH BREAKFAST 30 tablet 11    fexofenadine (ALLEGRA) 180 MG tablet Take 180 mg by mouth daily as needed for allergies.       fluticasone (FLONASE) 50 MCG/ACT nasal spray Place 2 sprays into both nostrils daily as needed for allergies or rhinitis.      glucose blood (FREESTYLE LITE) test strip 1 each (1 strip total) 2 (two) times daily Use as instructed. 200 each 2    Lancets (FREESTYLE) lancets Use 1 each 2 (two) times daily Use as instructed 200 each 1    lisdexamfetamine (VYVANSE) 50 MG capsule Take 1 capsule (50 mg total) by mouth every morning 90 capsule 0    lisdexamfetamine (VYVANSE) 50 MG capsule Take 1 capsule (50 mg total) by mouth every morning 90 capsule 0    lisdexamfetamine (VYVANSE) 60 MG capsule Take 1 capsule (60 mg total) by mouth every morning. 90 capsule 0    losartan-hydrochlorothiazide (HYZAAR) 100-12.5 MG tablet Take 1 tablet by mouth once daily 90 tablet 3    metFORMIN (GLUCOPHAGE) 500 MG tablet Take 500 mg by mouth 2 (two) times daily.  11    metFORMIN (GLUCOPHAGE) 500 MG tablet Take 1 tablet (500 mg total) by mouth 2 (two) times daily with meals Pear patient, now taking once daily 180 tablet 3    metFORMIN (GLUCOPHAGE) 500 MG tablet Take 2 tablets (1,000 mg total) by mouth 2 (two) times daily with meals  360 tablet 3    metFORMIN (GLUCOPHAGE) 500 MG tablet Take 2 tablets (1,000 mg total) by mouth 2 (two) times daily with a meal. 360 tablet 3    metFORMIN (GLUCOPHAGE) 500 MG tablet Take 2 tablets (1,000 mg total) by mouth 2 (two) times daily with meals. 360 tablet 3    methocarbamol (ROBAXIN) 500 MG tablet Take 1 tablet (500 mg total) by mouth every 6 (six) hours as needed.      Multiple Vitamins-Minerals (MULTIVITAMIN PO) Take 2 tablets by mouth daily.      Na  Sulfate-K Sulfate-Mg Sulf 17.5-3.13-1.6 GM/177ML SOLN Take as directed. Take both bottles at the times instructed by your provider. 354 mL 0    omeprazole (PRILOSEC) 20 MG capsule Take 1 capsule (20 mg total) by mouth once daily 90 capsule 2    omeprazole (PRILOSEC) 20 MG capsule Take 1 capsule (20 mg total) by mouth at bedtime.      omeprazole (PRILOSEC) 20 MG capsule Take 1 capsule (20 mg total) by mouth daily. 90 capsule 2    ondansetron (ZOFRAN-ODT) 4 MG disintegrating tablet Take 1 tablet (4 mg total) by mouth every 8 (eight) hours as needed for Nausea for up to 7 days 20 tablet 0    oxyCODONE (OXY IR/ROXICODONE) 5 MG immediate release tablet Take 1 to 2 tablets by mouth every 4 hours as needed for moderate or severe pain 20 tablet 0    oxyCODONE (ROXICODONE) 5 MG immediate release tablet Take 1-2 tablets (5-10 mg total) by mouth every 4 (four) hours as needed for moderate pain or severe pain. 20 tablet 0    oxyCODONE-acetaminophen (PERCOCET/ROXICET) 5-325 MG tablet Take 1 tablet by mouth every eight (8) hours as needed for pain for up to 3 days. (Patient not taking: Reported on 06/25/2023) 9 tablet 0 Not Taking   predniSONE (DELTASONE) 20 MG tablet 2 daily x 4 days, 1 daily x 4 days 12 tablet 0    predniSONE (DELTASONE) 50 MG tablet Take one tablet by mouth 13 hours before, 7 hours before and one tablet day of procedure 3 tablet 0    Semaglutide, 1 MG/DOSE, (OZEMPIC, 1 MG/DOSE,) 4 MG/3ML SOPN Inject 0.75 mLs (1 mg total) subcutaneously once  a week (Patient not taking: Reported on 06/18/2023) 3 mL 11 Not Taking   tamsulosin (FLOMAX) 0.4 MG CAPS capsule Take 1 capsule (0.4 mg total) by mouth daily for 10 days. 10 capsule 0    tirzepatide (MOUNJARO) 10 MG/0.5ML Pen Inject 10 mg subcutaneously every 7 (seven) days 2 mL 5 06/18/2023   tiZANidine (ZANAFLEX) 4 MG tablet Take 1 tablet (4 mg total) by mouth at bedtime.      triamterene-hydrochlorothiazide (DYAZIDE) 37.5-25 MG capsule Take 1 capsule by mouth daily every morning 30 capsule 5    triamterene-hydrochlorothiazide (DYAZIDE) 37.5-25 MG capsule Take 1 capsule by mouth every morning 90 capsule 3    triamterene-hydrochlorothiazide (DYAZIDE) 37.5-25 MG capsule Take 1 capsule by mouth every morning 90 capsule 3    triamterene-hydrochlorothiazide (DYAZIDE) 37.5-25 MG capsule Take 1 each (1 capsule total) by mouth every morning. 90 capsule 3      Allergies  Allergen Reactions   Codeine Other (See Comments)    Headaches and hot flashes   Azithromycin Rash    Abdominal wall abcess   Ivp Dye [Iodinated Contrast Media] Hives   Vicodin [Hydrocodone-Acetaminophen] Other (See Comments)    migraine     Past Medical History:  Diagnosis Date   Abdominal wall cellulitis 2013 and 2014   Arthritis    Asthma    allergic to grasses   B12 deficiency    Complication of anesthesia    Costochondritis    Diabetes mellitus without complication (HCC)    type 2   Gastric anomaly    multiple small ulcers   GERD (gastroesophageal reflux disease)    Herpes 06/2018   POSSIBLY IN EYE- PT TAKING VALTREX AND WILL SEE OPTHAMOLOGIST TO SEE IF VALTREX IS WORKING   History of abnormal cervical Pap smear    History of Clostridium difficile  colitis 2015   History of hiatal hernia    History of kidney stones    Hypertension    IBS (irritable bowel syndrome)    Migraine    Morbid obesity (HCC)    s/p attempted gastric banding now decompressed   PCOS (polycystic ovarian syndrome)    PONV  (postoperative nausea and vomiting)    WITH SPINAL ONLY   Stroke (HCC) 07/2019   right MCA    Review of systems:  Otherwise negative.    Physical Exam  Gen: Alert, oriented. Appears stated age.  HEENT: PERRLA. Lungs: No respiratory distress CV: RRR Abd: soft, benign, no masses Ext: No edema    Planned procedures: Proceed with colonoscopy. The patient understands the nature of the planned procedure, indications, risks, alternatives and potential complications including but not limited to bleeding, infection, perforation, damage to internal organs and possible oversedation/side effects from anesthesia. The patient agrees and gives consent to proceed.  Please refer to procedure notes for findings, recommendations and patient disposition/instructions.     Regis Bill, MD [facility]

## 2023-06-26 ENCOUNTER — Encounter: Payer: Self-pay | Admitting: Gastroenterology

## 2023-07-02 DIAGNOSIS — E119 Type 2 diabetes mellitus without complications: Secondary | ICD-10-CM | POA: Diagnosis not present

## 2023-07-02 DIAGNOSIS — H52223 Regular astigmatism, bilateral: Secondary | ICD-10-CM | POA: Diagnosis not present

## 2023-07-16 ENCOUNTER — Other Ambulatory Visit: Payer: Self-pay

## 2023-08-02 ENCOUNTER — Other Ambulatory Visit: Payer: Self-pay

## 2023-08-02 DIAGNOSIS — F331 Major depressive disorder, recurrent, moderate: Secondary | ICD-10-CM | POA: Diagnosis not present

## 2023-08-02 DIAGNOSIS — M150 Primary generalized (osteo)arthritis: Secondary | ICD-10-CM | POA: Diagnosis not present

## 2023-08-02 DIAGNOSIS — G8194 Hemiplegia, unspecified affecting left nondominant side: Secondary | ICD-10-CM | POA: Diagnosis not present

## 2023-08-02 MED ORDER — PANTOPRAZOLE SODIUM 40 MG PO TBEC
40.0000 mg | DELAYED_RELEASE_TABLET | Freq: Every day | ORAL | 3 refills | Status: DC
  Filled 2023-08-02: qty 90, 90d supply, fill #0
  Filled 2023-10-12: qty 90, 90d supply, fill #1
  Filled 2024-01-15: qty 90, 90d supply, fill #2
  Filled 2024-04-28: qty 90, 90d supply, fill #3
  Filled 2024-05-05 – 2024-05-09 (×2): qty 90, 90d supply, fill #0

## 2023-08-06 DIAGNOSIS — Z6841 Body Mass Index (BMI) 40.0 and over, adult: Secondary | ICD-10-CM | POA: Diagnosis not present

## 2023-08-06 DIAGNOSIS — E119 Type 2 diabetes mellitus without complications: Secondary | ICD-10-CM | POA: Diagnosis not present

## 2023-08-06 DIAGNOSIS — R1084 Generalized abdominal pain: Secondary | ICD-10-CM | POA: Diagnosis not present

## 2023-08-06 DIAGNOSIS — G8194 Hemiplegia, unspecified affecting left nondominant side: Secondary | ICD-10-CM | POA: Diagnosis not present

## 2023-08-07 ENCOUNTER — Other Ambulatory Visit: Payer: Self-pay

## 2023-08-07 DIAGNOSIS — I69398 Other sequelae of cerebral infarction: Secondary | ICD-10-CM | POA: Diagnosis not present

## 2023-08-07 DIAGNOSIS — F0631 Mood disorder due to known physiological condition with depressive features: Secondary | ICD-10-CM | POA: Diagnosis not present

## 2023-08-07 DIAGNOSIS — I6930 Unspecified sequelae of cerebral infarction: Secondary | ICD-10-CM | POA: Diagnosis not present

## 2023-08-07 MED ORDER — LAMOTRIGINE 25 MG PO TABS
50.0000 mg | ORAL_TABLET | Freq: Two times a day (BID) | ORAL | 5 refills | Status: DC
  Filled 2023-08-07 – 2023-09-12 (×3): qty 120, 30d supply, fill #0
  Filled 2023-10-12: qty 120, 30d supply, fill #1
  Filled 2023-11-19: qty 120, 30d supply, fill #2
  Filled 2023-12-17: qty 120, 30d supply, fill #3
  Filled 2024-04-28: qty 120, 30d supply, fill #4
  Filled 2024-05-05 – 2024-05-09 (×2): qty 120, 30d supply, fill #0
  Filled 2024-06-21: qty 120, 30d supply, fill #1

## 2023-08-07 MED ORDER — LAMOTRIGINE 25 MG PO TABS
ORAL_TABLET | ORAL | 0 refills | Status: DC
  Filled 2023-08-07: qty 42, 28d supply, fill #0

## 2023-08-10 DIAGNOSIS — R1084 Generalized abdominal pain: Secondary | ICD-10-CM | POA: Diagnosis not present

## 2023-08-17 ENCOUNTER — Ambulatory Visit: Payer: Commercial Managed Care - PPO | Attending: Neurology | Admitting: Physical Therapy

## 2023-08-17 DIAGNOSIS — M6281 Muscle weakness (generalized): Secondary | ICD-10-CM | POA: Insufficient documentation

## 2023-08-17 DIAGNOSIS — R269 Unspecified abnormalities of gait and mobility: Secondary | ICD-10-CM | POA: Insufficient documentation

## 2023-08-17 DIAGNOSIS — Z8673 Personal history of transient ischemic attack (TIA), and cerebral infarction without residual deficits: Secondary | ICD-10-CM | POA: Diagnosis not present

## 2023-08-17 NOTE — Therapy (Signed)
OUTPATIENT PHYSICAL THERAPY LOWER EXTREMITY EVALUATION   Patient Name: Kathryn Coffey MRN: [id number] DOB:May 28, 1971, 52 y.o., female Today's Date: 08/17/2023  END OF SESSION:  PT End of Session - 08/17/23 1332     Visit Number 1    Number of Visits 1    Date for PT Re-Evaluation 08/18/23    PT Start Time 0729    PT Stop Time 0905    PT Time Calculation (min) 96 min             Past Medical History:  Diagnosis Date   Abdominal wall cellulitis 2013 and 2014   Arthritis    Asthma    allergic to grasses   B12 deficiency    Complication of anesthesia    Costochondritis    Diabetes mellitus without complication (HCC)    type 2   Gastric anomaly    multiple small ulcers   GERD (gastroesophageal reflux disease)    Herpes 06/2018   POSSIBLY IN EYE- PT TAKING VALTREX AND WILL SEE OPTHAMOLOGIST TO SEE IF VALTREX IS WORKING   History of abnormal cervical Pap smear    History of Clostridium difficile colitis 2015   History of hiatal hernia    History of kidney stones    Hypertension    IBS (irritable bowel syndrome)    Migraine    Morbid obesity (HCC)    s/p attempted gastric banding now decompressed   PCOS (polycystic ovarian syndrome)    PONV (postoperative nausea and vomiting)    WITH SPINAL ONLY   Stroke (HCC) 07/2019   right MCA   Past Surgical History:  Procedure Laterality Date   BREAST EXCISIONAL BIOPSY Left    cyst excision - Dr. Madelin Headings   CARPAL TUNNEL RELEASE Right 01/18/2022   Procedure: CARPAL TUNNEL RELEASE ENDOSCOPIC;  Surgeon: Christena Flake, MD;  Location: [facility] ORS;  Service: Orthopedics;  Laterality: Right;   CESAREAN SECTION     x2   CHOLECYSTECTOMY     COLONOSCOPY  2014   COLONOSCOPY WITH PROPOFOL N/A 06/25/2023   Procedure: COLONOSCOPY WITH PROPOFOL;  Surgeon: Regis Bill, MD;  Location: [facility] ENDOSCOPY;  Service: Endoscopy;  Laterality: N/A;  DM   CORONARY ANGIOPLASTY     EAR CYST EXCISION Right 07/04/2018   Procedure: EXCISION  GLOMUS TUMOR THUMBNAIL;  Surgeon: Christena Flake, MD;  Location: [facility];  Service: Orthopedics;  Laterality: Right;   ESOPHAGOGASTRODUODENOSCOPY  2014   ESOPHAGOGASTRODUODENOSCOPY (EGD) WITH PROPOFOL N/A 12/06/2015   Procedure: ESOPHAGOGASTRODUODENOSCOPY (EGD) WITH PROPOFOL;  Surgeon: Scot Jun, MD;  Location: [facility] ENDOSCOPY;  Service: Endoscopy;  Laterality: N/A;   IR ANGIO INTRA EXTRACRAN SEL INTERNAL CAROTID UNI L MOD SED  07/15/2019   IR ANGIO VERTEBRAL SEL SUBCLAVIAN INNOMINATE UNI R MOD SED  07/15/2019   IR CT HEAD LTD  07/15/2019   IR INTRAVSC STENT CERV CAROTID W/O EMB-PROT MOD SED INC ANGIO  07/15/2019   IR PERCUTANEOUS ART THROMBECTOMY/INFUSION INTRACRANIAL INC DIAG ANGIO  07/15/2019   LAPAROSCOPIC GASTRIC BANDING  2012   LAPAROSCOPIC GASTRIC RESTRICTIVE DUODENAL PROCEDURE (DUODENAL SWITCH) N/A 01/25/2016   Procedure: LAPAROSCOPIC GASTRIC RESTRICTIVE DUODENAL PROCEDURE (DUODENAL SWITCH);  Surgeon: Everette Rank, MD;  Location: [facility] ORS;  Service: General;  Laterality: N/A;   OVARY SURGERY     x2   RADIOLOGY WITH ANESTHESIA N/A 07/15/2019   Procedure: IR WITH ANESTHESIA;  Surgeon: Julieanne Cotton, MD;  Location: [facility] OR;  Service: Radiology;  Laterality: N/A;   TONSILLECTOMY  TUBAL LIGATION  2006   UMBILICAL HERNIA REPAIR N/A 01/25/2016   Procedure: LAPAROSCOPIC UMBILICAL HERNIA;  Surgeon: Everette Rank, MD;  Location: [facility] ORS;  Service: General;  Laterality: N/A;   UMBILICAL HERNIA REPAIR N/A 10/20/2016   Procedure: HERNIA REPAIR UMBILICAL ADULT;  Surgeon: Nadeen Landau, MD;  Location: [facility] ORS;  Service: General;  Laterality: N/A;   Patient Active Problem List   Diagnosis Date Noted   Fibromuscular dysplasia of carotid artery (HCC) 09/04/2022   E. coli UTI    Type 2 diabetes mellitus with obesity (HCC)    Chronic pain of right knee    Flaccid monoplegia of upper extremity (HCC)    Hemiparesis affecting left side as late effect of stroke (HCC)    Acute  ischemic right middle cerebral artery (MCA) stroke (HCC) 07/22/2019   Carotid artery dissection  (HCC) s/p stent placement 07/21/2019   Diabetes mellitus type II, uncontrolled 07/21/2019   Acute blood loss anemia 07/21/2019   Leukocytosis 07/21/2019   Stroke (cerebrum) (HCC) - R MCA infarct s/p tenecteplase and mechanical thrombectomy w/ d/t ICA dissection  07/15/2019   Middle cerebral artery embolism, right 07/15/2019   Controlled type 2 diabetes mellitus without complication, without long-term current use of insulin (HCC) 05/28/2017   Attention deficit hyperactivity disorder (ADHD), combined type 04/18/2017   Morbid obesity (HCC) 01/25/2016   Umbilical hernia 01/12/2016   Hiatal hernia 12/08/2015   History of Clostridium difficile colitis 06/19/2014   HTN (hypertension) 12/23/2013   PCOS (polycystic ovarian syndrome) 12/23/2013   Bariatric surgery status 01/28/2013    PCP: Danella Penton, MD  REFERRING PROVIDER: Lonell Face, MD  REFERRING DIAG: History of stroke with residual effects  THERAPY DIAG:  History of stroke  Muscle weakness (generalized)  Gait difficulty  Rationale for Evaluation and Treatment: Rehabilitation  ONSET DATE: 07/15/2019  SUBJECTIVE:   SUBJECTIVE STATEMENT: See FCE  PAIN:  Are you having pain? Yes: NPRS scale: 2/10 Pain location: B knees/ L lateral lower leg Pain description: sharp/ achy Aggravating factors: prolonged standing/walking Relieving factors: medications  PRECAUTIONS: Fall  RED FLAGS: None   WEIGHT BEARING RESTRICTIONS: No  FALLS:  Has patient fallen in last 6 months? No   Physical Work Animal nutritionist [facility] [address] [place], [place] Phone [phone]  Fax [phone]  Name: Camyra, Champa Injury/Onset Date: 07/15/2019 Evaluation Date: 08/17/2023 Test Start, End, Duration: 7:29 AM, 9:05 AM, 1:36 hours Diagnosis: History of stroke with residual effects Height, Weight:  5'4'', 243lb Starting BP, HR, Pain: 155/95, 82 bpm, Pain 2 out of 10 This report summarizes the results of the [employer] FCE Physical Work Animal nutritionist.  This evaluation is substantiated by reliability and validity research conducted at the [place] [facility] [place] [facility] [place] and reported in the Journal of Occupational Medicine, September 1994   Overall Level of Work: Falls within the Sedentary range.  Exerting up to 10 pounds of force occasionally (Occasionally: activity or condition exist up to 1/3 or the time) and/or a negligible amount of force frequently (Frequently: activity or condition exist from 1/3 to 2/3 or the time) to lift, carry, push, pull, or otherwise move objects, including the human body.  Sedentary work involves sitting most of the time but may involve walking or standing for brief periods of time.  Jobs are sedentary if walking and standing are required only occasionally, and all other sedentary criteria are met.  Please note that the dynamic strength/manual materials handling section  of the report indicates a higher level of work than that determined by considering the client's performance on the entire test.  Our research shows that the safe, overall level of work is significantly influenced by non-materials handling (i.e. position tolerance and mobility) abilities.  To ignore these non-materials handling demands, negatively impacts the validity of the test.    Please see the Task Performance Table for specific abilities.  Tolerance for the 8-Hour Day: Based on the individual task scores in Dynamic Strength, Position Tolerance and Mobility, the client may be able to tolerate the Sedentary level of work for the 8-hour day/40-hour week.     Self Limiting Behavior:   Client participated fully in all tasks.  No self-limiting behavior noted.     Self Limiting < 20% of tasks = Within normal limits1  Self-Limiting 21% to 33% of tasks = Exceeds normal limits1  Self-Limiting >  33% of tasks = Significantly exceeds normal limits1 1When compared to a motivated group of patients who participated in research.    SUBJECTIVE PAIN STATEMENTS The client made the following subjective pain statements during the test:  Pt. c/o sharp L knee pain with squatting and kneeling tasks.  Several episodes of hamstring cramping during position tolerance testing.  Pt. feels very unsteady with L foot rolling (inversion) and flexed toes.  Marked increase in L lower leg pain with prolonged standing/walking tasks.  These pain statements were consistent with the observed movement patterns.  PAIN BEHAVIORS AND THEIR IMPACT ON TEST RESULTS The client demonstrated the following pain behaviors during the test:  L antalgic gait pattern with limited L arm swing/ heel strike and knee control during gait.    Extra time/ CGA when returning to standing posture after kneeling tasks.  These pain behaviors were consistent with the observed movement patterns.  These pain behaviors correlated with the client's self-reported pain.  These pain behaviors did not affect test performance.  FUNCTIONAL OUTCOME MEASURES   Lower Extremity Functional Scale: 22 out of 80  QuickDASH: 68.2%   BODY MECHANICS AND MOVEMENT PATTERNS The client demonstrated safe body mechanics and movement patterns during the test.  BRIEF SUMMARY OF MEDICAL HISTORY Pt. diagnosed with post-Covid stroke in 06/2019 and states she was tired for 2 weeks prior to stroke.  Pt. reports waking up to go to work with a bad headache and after taking a nap, she slid off bed to floor due to L sided weakness.  Pt. unable to speak/ communicate.  Pt. was in hospital for 30 days and went home with HHPT and then had outpatient OT/PT/SLP.  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  Pt. is currently not exercising and has chronic B knee/hip pain.  Pt. states she is only able to work 4 hours/day due to fatigue/ tired.  Pt. c/o brain  fog and memory issues (short-term) with every day and work-related tasks.   MEDICATIONS  Medication See scanned list    UPPER EXTREMITY FUNCTION Client has a diagnosis involving the upper extremities.  Ability to tolerate:  Handling Frequently  Fingering Frequently  Reaching at Waist Frequently  Reaching below Waist Frequently  Feeling Frequently Client's right hand is dominant.  BRIEF MUSCULOSKELETAL SCREEN  Cervical AROM: WNL.  B shoulder AROM WFL (L shoulder flexion/abduction limited 10 deg. as compared to R shoulder).  B LE AROM WNL and strength grossly 5/5 MMT except L hip flexion/ abduction 4+/5 MMT.  Lumbar AROM WNL.  L hip ER and foot  inversion with toe strike noted (limited to no L heel strike).  L toes flexed with wt. bearing. TEST LENGTH AND REST BREAKS The test lasted 1:36 hours.  Short pauses of 2-3 minutes between tasks occur while the evaluator is setting up equipment and documenting scores.  No additional rest breaks were taken.   TASK PERFORMANCE   Tasks Client Performance 1 Floor to waist lift 28 lb Occas. Waist to eye level lift 15 lb Occas. Two handed carrying 25 lb Occas. Pushing 32 lb Occas. 3 Pulling 28 lb Occas. 3 Sitting Frequently  Standing Occasionally  Work arms over head-standing Never Work bent over-standing/stooping Never Work kneeling Never Work squatting/crouching Never Climbing stairs  Occasionally Repetitive squatting Never Walking Occasionally Repetitive trunk rotation-standing Never Balance on level surfaces Adequate Balance on uneven surfaces Inadequate Balance on beam/scaffold Inadequate Forward Reaching Constantly Grip Strength L35  R57 lb  1 Occasionally = up to 1/3 of the day, Frequently = 1/3 to 2/3 of the day, Constantly = 2/3 to the full day.  Frequent lifting = 50% of Occasional; Constant lifting = 20%  of Occasional. 2 D.O.T. The aptitudes:  1 (90-100 percentile), 2 (67-89 percentile), 3 (34-66 percentile), 4 (11-33  percentile), 5 (0-10 percentile). 3 Pounds of force is the amount of force the client exerted during the pushing and pulling tasks.  If pushing or pulling is required for work, the force required for the task should be measured with a force gauge for comparison.     MAJOR AREAS OF DYSFUNCTION  Position Tolerance  Mobility  Balance  FACTORS UNDERLYING PERFORMANCE  Decreased muscle strength in L hip/ ankle.  Decreased range of motion in L shoulder (heaviness/ soreness reported)  Generalized de-conditioning.  Pain in B knee/ L lower leg  Generalized fatigue.  EXIT INTERVIEW  There were no changes in musculoskeletal status from beginning to end.  Pain Score: 2  Gait pattern leaving the evaluation is similar as compared to gait pattern used upon arriving for test.  The client drove herself to the evaluation.  Evaluator: Dorene Grebe, PT, DPT Phone: [phone]  ASSESSMENT:  CLINICAL IMPRESSION: See FCE report  OBJECTIVE IMPAIRMENTS: Abnormal gait, decreased activity tolerance, decreased balance, decreased endurance, difficulty walking, decreased ROM, decreased strength, impaired flexibility, impaired sensation, impaired UE functional use, obesity, and pain.   ACTIVITY LIMITATIONS: carrying, lifting, standing, squatting, stairs, and locomotion level  PARTICIPATION LIMITATIONS: cleaning, laundry, shopping, community activity, and occupation  PERSONAL FACTORS: Fitness and Past/current experiences are also affecting patient's functional outcome.   REHAB POTENTIAL: Good  CLINICAL DECISION MAKING: Stable/uncomplicated  EVALUATION COMPLEXITY: Low   PLAN:  PT FREQUENCY: one time visit  PLANNED INTERVENTIONS: 97110-Therapeutic exercises, 97530- Therapeutic activity, [id number]- Neuromuscular re-education, [id number]- Self Care, [id number]- Gait training, Patient/Family education, and Balance training  PLAN FOR NEXT SESSION: FCE completed and report faxed to Dr. Sherryll Burger office  Cammie Mcgee,  PT, DPT # [phone] 08/17/2023, 1:34 PM

## 2023-08-20 ENCOUNTER — Other Ambulatory Visit: Payer: Self-pay

## 2023-08-23 ENCOUNTER — Other Ambulatory Visit: Payer: Self-pay

## 2023-08-23 MED ORDER — HYOSCYAMINE SULFATE 0.125 MG PO TABS
0.1250 mg | ORAL_TABLET | ORAL | 0 refills | Status: DC | PRN
  Filled 2023-08-23: qty 30, 5d supply, fill #0

## 2023-08-24 ENCOUNTER — Other Ambulatory Visit: Payer: Self-pay

## 2023-08-24 DIAGNOSIS — E860 Dehydration: Secondary | ICD-10-CM | POA: Diagnosis not present

## 2023-08-24 DIAGNOSIS — M79642 Pain in left hand: Secondary | ICD-10-CM | POA: Diagnosis not present

## 2023-08-24 DIAGNOSIS — A09 Infectious gastroenteritis and colitis, unspecified: Secondary | ICD-10-CM | POA: Diagnosis not present

## 2023-08-24 MED ORDER — HYOSCYAMINE SULFATE 0.125 MG PO TABS
0.1250 mg | ORAL_TABLET | Freq: Three times a day (TID) | ORAL | 0 refills | Status: AC
  Filled 2023-08-24 (×3): qty 30, 10d supply, fill #0

## 2023-08-24 MED ORDER — SUCRALFATE 1 G PO TABS
1.0000 g | ORAL_TABLET | Freq: Three times a day (TID) | ORAL | 0 refills | Status: AC
  Filled 2023-08-24 (×2): qty 60, 20d supply, fill #0

## 2023-08-28 ENCOUNTER — Other Ambulatory Visit: Payer: Self-pay

## 2023-08-28 MED ORDER — DIPHENOXYLATE-ATROPINE 2.5-0.025 MG PO TABS
1.0000 | ORAL_TABLET | Freq: Three times a day (TID) | ORAL | 0 refills | Status: AC | PRN
  Filled 2023-08-28: qty 20, 7d supply, fill #0

## 2023-08-30 ENCOUNTER — Other Ambulatory Visit: Payer: Self-pay

## 2023-08-31 ENCOUNTER — Other Ambulatory Visit: Payer: Self-pay

## 2023-08-31 MED ORDER — LISDEXAMFETAMINE DIMESYLATE 60 MG PO CAPS
60.0000 mg | ORAL_CAPSULE | Freq: Every morning | ORAL | 0 refills | Status: AC
  Filled 2023-12-28: qty 90, 90d supply, fill #0

## 2023-09-03 ENCOUNTER — Other Ambulatory Visit: Payer: Self-pay

## 2023-09-12 ENCOUNTER — Other Ambulatory Visit: Payer: Self-pay

## 2023-09-13 ENCOUNTER — Other Ambulatory Visit: Payer: Self-pay

## 2023-10-01 ENCOUNTER — Other Ambulatory Visit: Payer: Self-pay

## 2023-10-01 MED ORDER — MOUNJARO 10 MG/0.5ML ~~LOC~~ SOAJ
10.0000 mg | SUBCUTANEOUS | 5 refills | Status: DC
  Filled 2023-10-01: qty 2, 28d supply, fill #0
  Filled 2023-10-23: qty 2, 28d supply, fill #1
  Filled 2023-11-19: qty 2, 28d supply, fill #2
  Filled 2024-01-02: qty 2, 28d supply, fill #3
  Filled 2024-01-28: qty 2, 28d supply, fill #4
  Filled 2024-02-29 (×2): qty 2, 28d supply, fill #5

## 2023-10-01 MED ORDER — LISDEXAMFETAMINE DIMESYLATE 60 MG PO CAPS
60.0000 mg | ORAL_CAPSULE | Freq: Every morning | ORAL | 0 refills | Status: AC
  Filled 2023-10-01: qty 90, 90d supply, fill #0

## 2023-10-02 ENCOUNTER — Other Ambulatory Visit: Payer: Self-pay

## 2023-10-12 ENCOUNTER — Other Ambulatory Visit: Payer: Self-pay

## 2023-10-23 ENCOUNTER — Other Ambulatory Visit: Payer: Self-pay

## 2023-10-24 ENCOUNTER — Other Ambulatory Visit: Payer: Self-pay

## 2023-11-05 ENCOUNTER — Other Ambulatory Visit: Payer: Self-pay

## 2023-11-05 MED ORDER — ALPRAZOLAM 0.25 MG PO TABS
0.2500 mg | ORAL_TABLET | Freq: Every day | ORAL | 0 refills | Status: AC | PRN
  Filled 2023-11-05: qty 30, 30d supply, fill #0

## 2023-11-19 ENCOUNTER — Other Ambulatory Visit: Payer: Self-pay

## 2023-11-19 MED ORDER — LOSARTAN POTASSIUM-HCTZ 100-12.5 MG PO TABS
1.0000 | ORAL_TABLET | Freq: Every day | ORAL | 3 refills | Status: AC
  Filled 2023-11-19: qty 90, 90d supply, fill #0
  Filled 2023-12-22 – 2024-09-09 (×2): qty 90, 90d supply, fill #1

## 2023-11-22 ENCOUNTER — Other Ambulatory Visit: Payer: Self-pay

## 2023-11-23 ENCOUNTER — Other Ambulatory Visit: Payer: Self-pay

## 2023-11-23 MED ORDER — PREDNISONE 20 MG PO TABS
40.0000 mg | ORAL_TABLET | Freq: Every day | ORAL | 0 refills | Status: DC
  Filled 2023-11-23: qty 15, 10d supply, fill #0

## 2023-11-26 ENCOUNTER — Other Ambulatory Visit: Payer: Self-pay

## 2023-11-26 DIAGNOSIS — Z0000 Encounter for general adult medical examination without abnormal findings: Secondary | ICD-10-CM | POA: Diagnosis not present

## 2023-11-26 DIAGNOSIS — E119 Type 2 diabetes mellitus without complications: Secondary | ICD-10-CM | POA: Diagnosis not present

## 2023-11-26 DIAGNOSIS — G8194 Hemiplegia, unspecified affecting left nondominant side: Secondary | ICD-10-CM | POA: Diagnosis not present

## 2023-11-26 DIAGNOSIS — E538 Deficiency of other specified B group vitamins: Secondary | ICD-10-CM | POA: Diagnosis not present

## 2023-11-26 MED ORDER — TRIAMTERENE-HCTZ 37.5-25 MG PO CAPS
1.0000 | ORAL_CAPSULE | Freq: Every morning | ORAL | 3 refills | Status: AC
  Filled 2023-11-26 – 2024-01-10 (×2): qty 90, 90d supply, fill #0
  Filled 2024-04-04: qty 90, 90d supply, fill #1
  Filled 2024-08-06 – 2024-09-23 (×3): qty 90, 90d supply, fill #2

## 2023-11-26 MED ORDER — ESCITALOPRAM OXALATE 10 MG PO TABS
10.0000 mg | ORAL_TABLET | Freq: Every day | ORAL | 3 refills | Status: AC
  Filled 2023-11-26 – 2024-02-20 (×2): qty 90, 90d supply, fill #0
  Filled 2024-06-02: qty 90, 90d supply, fill #1

## 2023-11-27 ENCOUNTER — Other Ambulatory Visit: Payer: Self-pay

## 2023-11-28 ENCOUNTER — Other Ambulatory Visit: Payer: Self-pay

## 2023-11-28 MED ORDER — AMPHETAMINE-DEXTROAMPHETAMINE 20 MG PO TABS
20.0000 mg | ORAL_TABLET | Freq: Every day | ORAL | 0 refills | Status: DC
  Filled 2023-11-28: qty 90, 90d supply, fill #0

## 2023-12-22 ENCOUNTER — Other Ambulatory Visit: Payer: Self-pay

## 2023-12-22 ENCOUNTER — Other Ambulatory Visit (HOSPITAL_BASED_OUTPATIENT_CLINIC_OR_DEPARTMENT_OTHER): Payer: Self-pay

## 2023-12-23 ENCOUNTER — Other Ambulatory Visit: Payer: Self-pay

## 2023-12-24 ENCOUNTER — Other Ambulatory Visit: Payer: Self-pay

## 2023-12-25 ENCOUNTER — Other Ambulatory Visit: Payer: Self-pay

## 2023-12-25 MED ORDER — LISDEXAMFETAMINE DIMESYLATE 60 MG PO CAPS
60.0000 mg | ORAL_CAPSULE | Freq: Every morning | ORAL | 0 refills | Status: AC
  Filled 2024-04-04: qty 90, 90d supply, fill #0

## 2023-12-28 ENCOUNTER — Other Ambulatory Visit: Payer: Self-pay

## 2024-01-09 DIAGNOSIS — H15101 Unspecified episcleritis, right eye: Secondary | ICD-10-CM | POA: Diagnosis not present

## 2024-01-10 ENCOUNTER — Other Ambulatory Visit: Payer: Self-pay

## 2024-01-11 ENCOUNTER — Other Ambulatory Visit: Payer: Self-pay

## 2024-01-11 DIAGNOSIS — H15101 Unspecified episcleritis, right eye: Secondary | ICD-10-CM | POA: Diagnosis not present

## 2024-01-11 MED ORDER — PREDNISOLONE ACETATE 1 % OP SUSP
1.0000 [drp] | Freq: Four times a day (QID) | OPHTHALMIC | 0 refills | Status: AC
  Filled 2024-01-11: qty 5, 25d supply, fill #0

## 2024-01-15 ENCOUNTER — Other Ambulatory Visit: Payer: Self-pay

## 2024-01-16 ENCOUNTER — Other Ambulatory Visit: Payer: Self-pay

## 2024-01-16 MED ORDER — LAMOTRIGINE 25 MG PO TABS
50.0000 mg | ORAL_TABLET | Freq: Two times a day (BID) | ORAL | 3 refills | Status: AC
  Filled 2024-01-16: qty 360, 90d supply, fill #0
  Filled 2024-06-21 – 2024-09-23 (×3): qty 360, 90d supply, fill #1

## 2024-01-21 DIAGNOSIS — I69398 Other sequelae of cerebral infarction: Secondary | ICD-10-CM | POA: Diagnosis not present

## 2024-01-21 DIAGNOSIS — I6930 Unspecified sequelae of cerebral infarction: Secondary | ICD-10-CM | POA: Diagnosis not present

## 2024-01-21 DIAGNOSIS — F0631 Mood disorder due to known physiological condition with depressive features: Secondary | ICD-10-CM | POA: Diagnosis not present

## 2024-01-21 DIAGNOSIS — R4189 Other symptoms and signs involving cognitive functions and awareness: Secondary | ICD-10-CM | POA: Diagnosis not present

## 2024-01-21 DIAGNOSIS — G4733 Obstructive sleep apnea (adult) (pediatric): Secondary | ICD-10-CM | POA: Diagnosis not present

## 2024-02-15 ENCOUNTER — Encounter (HOSPITAL_COMMUNITY): Payer: Self-pay | Admitting: Interventional Radiology

## 2024-02-17 ENCOUNTER — Other Ambulatory Visit: Payer: Self-pay

## 2024-02-17 MED ORDER — LOSARTAN POTASSIUM-HCTZ 100-12.5 MG PO TABS
1.0000 | ORAL_TABLET | Freq: Every day | ORAL | 3 refills | Status: AC
  Filled 2024-02-17: qty 90, 90d supply, fill #0
  Filled 2024-04-28 – 2024-04-29 (×2): qty 90, 90d supply, fill #1
  Filled 2024-05-05 – 2024-05-09 (×2): qty 90, 90d supply, fill #0

## 2024-02-17 MED ORDER — BUPROPION HCL 100 MG PO TABS
ORAL_TABLET | ORAL | 0 refills | Status: DC
  Filled 2024-02-17: qty 270, 90d supply, fill #0

## 2024-02-17 MED ORDER — AMPHETAMINE-DEXTROAMPHETAMINE 20 MG PO TABS
20.0000 mg | ORAL_TABLET | Freq: Every day | ORAL | 0 refills | Status: AC
  Filled 2024-02-20 – 2024-02-29 (×2): qty 90, 90d supply, fill #0

## 2024-02-18 ENCOUNTER — Other Ambulatory Visit: Payer: Self-pay

## 2024-02-20 ENCOUNTER — Other Ambulatory Visit: Payer: Self-pay

## 2024-02-20 DIAGNOSIS — G8194 Hemiplegia, unspecified affecting left nondominant side: Secondary | ICD-10-CM | POA: Diagnosis not present

## 2024-02-20 DIAGNOSIS — U071 COVID-19: Secondary | ICD-10-CM | POA: Diagnosis not present

## 2024-02-20 DIAGNOSIS — E119 Type 2 diabetes mellitus without complications: Secondary | ICD-10-CM | POA: Diagnosis not present

## 2024-02-20 DIAGNOSIS — J208 Acute bronchitis due to other specified organisms: Secondary | ICD-10-CM | POA: Diagnosis not present

## 2024-02-20 MED ORDER — PAXLOVID (300/100) 20 X 150 MG & 10 X 100MG PO TBPK
ORAL_TABLET | ORAL | 0 refills | Status: AC
  Filled 2024-02-20: qty 30, 5d supply, fill #0

## 2024-02-20 MED ORDER — PREDNISONE 10 MG PO TABS
ORAL_TABLET | ORAL | 0 refills | Status: DC
  Filled 2024-02-20: qty 20, 8d supply, fill #0

## 2024-02-20 MED ORDER — HYDROCOD POLI-CHLORPHE POLI ER 10-8 MG/5ML PO SUER
5.0000 mL | Freq: Two times a day (BID) | ORAL | 0 refills | Status: AC | PRN
  Filled 2024-02-20: qty 70, 7d supply, fill #0

## 2024-02-21 ENCOUNTER — Other Ambulatory Visit: Payer: Self-pay

## 2024-02-25 ENCOUNTER — Other Ambulatory Visit: Payer: Self-pay

## 2024-02-26 ENCOUNTER — Other Ambulatory Visit: Payer: Self-pay

## 2024-02-29 ENCOUNTER — Other Ambulatory Visit: Payer: Self-pay

## 2024-03-18 ENCOUNTER — Other Ambulatory Visit: Payer: Self-pay

## 2024-03-18 MED ORDER — ETODOLAC 400 MG PO TABS
400.0000 mg | ORAL_TABLET | Freq: Two times a day (BID) | ORAL | 11 refills | Status: AC
  Filled 2024-03-18: qty 60, 30d supply, fill #0
  Filled 2024-04-28: qty 60, 30d supply, fill #1
  Filled 2024-05-05 – 2024-05-09 (×2): qty 60, 30d supply, fill #0
  Filled 2024-06-21: qty 60, 30d supply, fill #1
  Filled 2024-08-06 – 2024-09-23 (×2): qty 60, 30d supply, fill #2

## 2024-03-21 ENCOUNTER — Other Ambulatory Visit: Payer: Self-pay

## 2024-04-02 ENCOUNTER — Other Ambulatory Visit: Payer: Self-pay

## 2024-04-03 ENCOUNTER — Other Ambulatory Visit: Payer: Self-pay

## 2024-04-03 MED ORDER — LISDEXAMFETAMINE DIMESYLATE 60 MG PO CAPS
60.0000 mg | ORAL_CAPSULE | Freq: Every morning | ORAL | 0 refills | Status: AC
  Filled 2024-04-03: qty 90, 90d supply, fill #0

## 2024-04-04 ENCOUNTER — Other Ambulatory Visit: Payer: Self-pay

## 2024-04-06 ENCOUNTER — Other Ambulatory Visit: Payer: Self-pay

## 2024-04-06 MED ORDER — MOUNJARO 10 MG/0.5ML ~~LOC~~ SOAJ
10.0000 mg | SUBCUTANEOUS | 5 refills | Status: DC
  Filled 2024-04-06: qty 2, 28d supply, fill #0

## 2024-04-28 ENCOUNTER — Other Ambulatory Visit (HOSPITAL_COMMUNITY): Payer: Self-pay

## 2024-04-29 ENCOUNTER — Other Ambulatory Visit: Payer: Self-pay

## 2024-05-02 ENCOUNTER — Other Ambulatory Visit: Payer: Self-pay

## 2024-05-05 ENCOUNTER — Other Ambulatory Visit (HOSPITAL_COMMUNITY): Payer: Self-pay

## 2024-05-05 ENCOUNTER — Other Ambulatory Visit: Payer: Self-pay

## 2024-05-06 ENCOUNTER — Other Ambulatory Visit: Payer: Self-pay

## 2024-05-06 ENCOUNTER — Encounter: Payer: Self-pay | Admitting: Pharmacist

## 2024-05-06 DIAGNOSIS — E119 Type 2 diabetes mellitus without complications: Secondary | ICD-10-CM | POA: Diagnosis not present

## 2024-05-06 DIAGNOSIS — K219 Gastro-esophageal reflux disease without esophagitis: Secondary | ICD-10-CM | POA: Diagnosis not present

## 2024-05-06 DIAGNOSIS — R682 Dry mouth, unspecified: Secondary | ICD-10-CM | POA: Diagnosis not present

## 2024-05-06 MED ORDER — MOUNJARO 7.5 MG/0.5ML ~~LOC~~ SOAJ
7.5000 mg | SUBCUTANEOUS | 11 refills | Status: AC
  Filled 2024-05-06: qty 2, 28d supply, fill #0
  Filled 2024-06-02 – 2024-06-12 (×2): qty 2, 28d supply, fill #1
  Filled 2024-07-08: qty 2, 28d supply, fill #2
  Filled 2024-08-06: qty 2, 28d supply, fill #3

## 2024-05-06 MED ORDER — METOCLOPRAMIDE HCL 5 MG PO TABS
5.0000 mg | ORAL_TABLET | Freq: Two times a day (BID) | ORAL | 5 refills | Status: AC
  Filled 2024-05-06: qty 60, 30d supply, fill #0
  Filled 2024-06-02: qty 60, 30d supply, fill #1
  Filled 2024-07-14: qty 60, 30d supply, fill #2
  Filled 2024-08-22: qty 60, 30d supply, fill #3
  Filled 2024-09-09 – 2024-09-23 (×2): qty 60, 30d supply, fill #4

## 2024-05-09 ENCOUNTER — Other Ambulatory Visit: Payer: Self-pay

## 2024-05-09 ENCOUNTER — Other Ambulatory Visit (HOSPITAL_COMMUNITY): Payer: Self-pay

## 2024-05-20 ENCOUNTER — Other Ambulatory Visit: Payer: Self-pay

## 2024-05-20 DIAGNOSIS — E119 Type 2 diabetes mellitus without complications: Secondary | ICD-10-CM | POA: Diagnosis not present

## 2024-05-20 DIAGNOSIS — R682 Dry mouth, unspecified: Secondary | ICD-10-CM | POA: Diagnosis not present

## 2024-05-20 DIAGNOSIS — S76011A Strain of muscle, fascia and tendon of right hip, initial encounter: Secondary | ICD-10-CM | POA: Diagnosis not present

## 2024-05-20 DIAGNOSIS — E538 Deficiency of other specified B group vitamins: Secondary | ICD-10-CM | POA: Diagnosis not present

## 2024-05-20 MED ORDER — HYDROCODONE-ACETAMINOPHEN 5-325 MG PO TABS
1.0000 | ORAL_TABLET | Freq: Four times a day (QID) | ORAL | 0 refills | Status: AC | PRN
  Filled 2024-05-20: qty 15, 4d supply, fill #0

## 2024-05-27 ENCOUNTER — Other Ambulatory Visit: Payer: Self-pay

## 2024-05-27 DIAGNOSIS — E119 Type 2 diabetes mellitus without complications: Secondary | ICD-10-CM | POA: Diagnosis not present

## 2024-05-27 DIAGNOSIS — F902 Attention-deficit hyperactivity disorder, combined type: Secondary | ICD-10-CM | POA: Diagnosis not present

## 2024-05-27 DIAGNOSIS — G8194 Hemiplegia, unspecified affecting left nondominant side: Secondary | ICD-10-CM | POA: Diagnosis not present

## 2024-05-27 MED ORDER — JARDIANCE 10 MG PO TABS
10.0000 mg | ORAL_TABLET | Freq: Every day | ORAL | 3 refills | Status: AC
  Filled 2024-05-27: qty 90, 90d supply, fill #0
  Filled 2024-07-14 – 2024-08-23 (×5): qty 90, 90d supply, fill #1

## 2024-05-29 ENCOUNTER — Other Ambulatory Visit: Payer: Self-pay

## 2024-06-02 ENCOUNTER — Other Ambulatory Visit: Payer: Self-pay

## 2024-06-02 ENCOUNTER — Encounter: Payer: Self-pay | Admitting: Pharmacist

## 2024-06-02 MED ORDER — LISDEXAMFETAMINE DIMESYLATE 60 MG PO CAPS
60.0000 mg | ORAL_CAPSULE | Freq: Every morning | ORAL | 0 refills | Status: DC
  Filled 2024-07-01: qty 90, 90d supply, fill #0

## 2024-06-03 ENCOUNTER — Encounter: Payer: Self-pay | Admitting: Pharmacist

## 2024-06-03 ENCOUNTER — Other Ambulatory Visit: Payer: Self-pay

## 2024-06-05 ENCOUNTER — Other Ambulatory Visit (HOSPITAL_COMMUNITY): Payer: Self-pay

## 2024-06-05 ENCOUNTER — Other Ambulatory Visit: Payer: Self-pay

## 2024-06-10 ENCOUNTER — Other Ambulatory Visit: Payer: Self-pay | Admitting: Medical Genetics

## 2024-06-10 DIAGNOSIS — Z006 Encounter for examination for normal comparison and control in clinical research program: Secondary | ICD-10-CM

## 2024-06-12 ENCOUNTER — Other Ambulatory Visit: Payer: Self-pay

## 2024-06-13 ENCOUNTER — Other Ambulatory Visit: Payer: Self-pay

## 2024-06-16 ENCOUNTER — Other Ambulatory Visit: Payer: Self-pay

## 2024-06-16 MED ORDER — AMPHETAMINE-DEXTROAMPHETAMINE 20 MG PO TABS
20.0000 mg | ORAL_TABLET | Freq: Every day | ORAL | 0 refills | Status: DC
  Filled 2024-06-16: qty 90, 90d supply, fill #0

## 2024-06-21 ENCOUNTER — Other Ambulatory Visit (HOSPITAL_COMMUNITY): Payer: Self-pay

## 2024-06-23 ENCOUNTER — Other Ambulatory Visit (HOSPITAL_COMMUNITY): Payer: Self-pay

## 2024-06-23 MED ORDER — BUPROPION HCL 100 MG PO TABS
ORAL_TABLET | ORAL | 0 refills | Status: AC
  Filled 2024-06-23: qty 270, 90d supply, fill #0

## 2024-06-23 MED ORDER — PANTOPRAZOLE SODIUM 40 MG PO TBEC
40.0000 mg | DELAYED_RELEASE_TABLET | Freq: Every day | ORAL | 3 refills | Status: AC
  Filled 2024-06-23 – 2024-07-07 (×3): qty 90, 90d supply, fill #0
  Filled 2024-07-08: qty 30, 30d supply, fill #0
  Filled 2024-07-14 – 2024-08-22 (×3): qty 30, 30d supply, fill #1
  Filled 2024-09-09 – 2024-09-23 (×2): qty 30, 30d supply, fill #2

## 2024-06-24 ENCOUNTER — Other Ambulatory Visit: Payer: Self-pay

## 2024-06-25 ENCOUNTER — Other Ambulatory Visit (HOSPITAL_COMMUNITY): Payer: Self-pay

## 2024-06-26 ENCOUNTER — Other Ambulatory Visit: Payer: Self-pay

## 2024-06-30 ENCOUNTER — Other Ambulatory Visit: Payer: Self-pay

## 2024-07-01 ENCOUNTER — Other Ambulatory Visit: Payer: Self-pay

## 2024-07-03 ENCOUNTER — Other Ambulatory Visit (HOSPITAL_COMMUNITY): Payer: Self-pay

## 2024-07-07 ENCOUNTER — Other Ambulatory Visit (HOSPITAL_COMMUNITY): Payer: Self-pay

## 2024-07-08 ENCOUNTER — Other Ambulatory Visit: Payer: Self-pay

## 2024-07-08 DIAGNOSIS — J40 Bronchitis, not specified as acute or chronic: Secondary | ICD-10-CM | POA: Diagnosis not present

## 2024-07-08 MED ORDER — AMOXICILLIN 875 MG PO TABS
875.0000 mg | ORAL_TABLET | Freq: Two times a day (BID) | ORAL | 0 refills | Status: AC
  Filled 2024-07-08: qty 14, 7d supply, fill #0

## 2024-07-08 MED ORDER — HYDROCOD POLI-CHLORPHE POLI ER 10-8 MG/5ML PO SUER
5.0000 mL | Freq: Two times a day (BID) | ORAL | 0 refills | Status: AC | PRN
  Filled 2024-07-08: qty 70, 7d supply, fill #0

## 2024-07-08 MED ORDER — PREDNISONE 10 MG PO TABS
ORAL_TABLET | ORAL | 0 refills | Status: AC
  Filled 2024-07-08: qty 20, 8d supply, fill #0

## 2024-07-09 ENCOUNTER — Other Ambulatory Visit (HOSPITAL_COMMUNITY): Payer: Self-pay

## 2024-07-09 MED ORDER — TRIAMTERENE-HCTZ 37.5-25 MG PO CAPS
1.0000 | ORAL_CAPSULE | Freq: Every morning | ORAL | 3 refills | Status: AC
  Filled 2024-07-09 – 2024-07-11 (×3): qty 90, 90d supply, fill #0
  Filled 2024-09-25: qty 90, 90d supply, fill #1

## 2024-07-10 ENCOUNTER — Other Ambulatory Visit (HOSPITAL_COMMUNITY): Payer: Self-pay

## 2024-07-11 ENCOUNTER — Other Ambulatory Visit (HOSPITAL_COMMUNITY): Payer: Self-pay

## 2024-07-14 ENCOUNTER — Other Ambulatory Visit (HOSPITAL_COMMUNITY): Payer: Self-pay

## 2024-07-14 ENCOUNTER — Other Ambulatory Visit: Payer: Self-pay

## 2024-07-15 ENCOUNTER — Other Ambulatory Visit (HOSPITAL_COMMUNITY): Payer: Self-pay

## 2024-07-15 ENCOUNTER — Other Ambulatory Visit: Payer: Self-pay

## 2024-07-16 ENCOUNTER — Other Ambulatory Visit (HOSPITAL_COMMUNITY): Payer: Self-pay

## 2024-08-05 LAB — GENECONNECT MOLECULAR SCREEN: Genetic Analysis Overall Interpretation: NEGATIVE

## 2024-08-06 ENCOUNTER — Other Ambulatory Visit (HOSPITAL_COMMUNITY): Payer: Self-pay

## 2024-08-07 ENCOUNTER — Other Ambulatory Visit: Payer: Self-pay

## 2024-08-07 ENCOUNTER — Other Ambulatory Visit (HOSPITAL_COMMUNITY): Payer: Self-pay

## 2024-08-07 MED ORDER — MOUNJARO 7.5 MG/0.5ML ~~LOC~~ SOAJ
7.5000 mg | SUBCUTANEOUS | 11 refills | Status: AC
  Filled 2024-08-07 – 2024-08-12 (×2): qty 2, 28d supply, fill #0
  Filled 2024-09-09: qty 2, 28d supply, fill #1
  Filled 2024-09-23: qty 2, 28d supply, fill #2

## 2024-08-07 MED ORDER — LAMOTRIGINE 25 MG PO TABS
ORAL_TABLET | ORAL | 2 refills | Status: AC
  Filled 2024-08-07 – 2024-08-12 (×2): qty 120, 30d supply, fill #0
  Filled 2024-09-23: qty 120, 30d supply, fill #1

## 2024-08-08 ENCOUNTER — Other Ambulatory Visit (HOSPITAL_COMMUNITY): Payer: Self-pay

## 2024-08-08 ENCOUNTER — Other Ambulatory Visit: Payer: Self-pay

## 2024-08-12 ENCOUNTER — Other Ambulatory Visit: Payer: Self-pay

## 2024-08-12 ENCOUNTER — Other Ambulatory Visit (HOSPITAL_COMMUNITY): Payer: Self-pay

## 2024-08-13 ENCOUNTER — Other Ambulatory Visit (HOSPITAL_COMMUNITY): Payer: Self-pay

## 2024-08-13 ENCOUNTER — Encounter (HOSPITAL_COMMUNITY): Payer: Self-pay

## 2024-08-15 ENCOUNTER — Other Ambulatory Visit: Payer: Self-pay

## 2024-08-15 MED ORDER — MUPIROCIN 2 % EX OINT
TOPICAL_OINTMENT | CUTANEOUS | 1 refills | Status: AC
  Filled 2024-08-15: qty 22, 30d supply, fill #0

## 2024-08-15 MED ORDER — DOXYCYCLINE HYCLATE 100 MG PO CAPS
ORAL_CAPSULE | ORAL | 0 refills | Status: AC
  Filled 2024-08-15: qty 14, 7d supply, fill #0

## 2024-08-18 ENCOUNTER — Other Ambulatory Visit: Payer: Self-pay

## 2024-08-18 ENCOUNTER — Other Ambulatory Visit (HOSPITAL_COMMUNITY): Payer: Self-pay

## 2024-08-22 ENCOUNTER — Other Ambulatory Visit (HOSPITAL_COMMUNITY): Payer: Self-pay

## 2024-08-23 ENCOUNTER — Other Ambulatory Visit (HOSPITAL_COMMUNITY): Payer: Self-pay

## 2024-08-23 ENCOUNTER — Other Ambulatory Visit: Payer: Self-pay

## 2024-08-24 ENCOUNTER — Other Ambulatory Visit: Payer: Self-pay

## 2024-08-25 ENCOUNTER — Other Ambulatory Visit: Payer: Self-pay

## 2024-08-25 ENCOUNTER — Other Ambulatory Visit (HOSPITAL_COMMUNITY): Payer: Self-pay

## 2024-08-25 MED ORDER — AMPHETAMINE-DEXTROAMPHETAMINE 20 MG PO TABS
20.0000 mg | ORAL_TABLET | Freq: Every day | ORAL | 0 refills | Status: AC
  Filled 2024-09-09 – 2024-09-12 (×2): qty 90, 90d supply, fill #0

## 2024-08-25 MED ORDER — TIZANIDINE HCL 4 MG PO TABS
4.0000 mg | ORAL_TABLET | Freq: Three times a day (TID) | ORAL | 11 refills | Status: AC
  Filled 2024-08-25: qty 90, 30d supply, fill #0
  Filled 2024-09-23: qty 90, 30d supply, fill #1

## 2024-08-25 MED ORDER — ESCITALOPRAM OXALATE 10 MG PO TABS
10.0000 mg | ORAL_TABLET | Freq: Every day | ORAL | 1 refills | Status: AC
  Filled 2024-08-25: qty 90, 90d supply, fill #0

## 2024-08-25 MED ORDER — PANTOPRAZOLE SODIUM 40 MG PO TBEC
40.0000 mg | DELAYED_RELEASE_TABLET | Freq: Every day | ORAL | 3 refills | Status: AC
  Filled 2024-08-25 – 2024-09-26 (×3): qty 90, 90d supply, fill #0

## 2024-08-26 ENCOUNTER — Other Ambulatory Visit: Payer: Self-pay

## 2024-08-27 ENCOUNTER — Other Ambulatory Visit: Payer: Self-pay

## 2024-08-28 ENCOUNTER — Other Ambulatory Visit: Payer: Self-pay

## 2024-08-29 ENCOUNTER — Other Ambulatory Visit: Payer: Self-pay

## 2024-09-09 ENCOUNTER — Other Ambulatory Visit (HOSPITAL_COMMUNITY): Payer: Self-pay

## 2024-09-09 ENCOUNTER — Other Ambulatory Visit: Payer: Self-pay

## 2024-09-11 ENCOUNTER — Other Ambulatory Visit (HOSPITAL_COMMUNITY): Payer: Self-pay

## 2024-09-12 ENCOUNTER — Other Ambulatory Visit: Payer: Self-pay

## 2024-09-24 ENCOUNTER — Other Ambulatory Visit (HOSPITAL_COMMUNITY): Payer: Self-pay

## 2024-09-25 ENCOUNTER — Other Ambulatory Visit: Payer: Self-pay

## 2024-09-25 MED ORDER — LISDEXAMFETAMINE DIMESYLATE 60 MG PO CAPS
60.0000 mg | ORAL_CAPSULE | Freq: Every morning | ORAL | 0 refills | Status: AC
  Filled 2024-09-26: qty 90, 90d supply, fill #0

## 2024-09-26 ENCOUNTER — Other Ambulatory Visit: Payer: Self-pay
